# Patient Record
Sex: Male | Born: 1935 | Race: White | Hispanic: No | Smoking: Former smoker
Health system: Southern US, Community
[De-identification: ages and names within clinical notes are randomized; demographics above are authoritative.]

## PROBLEM LIST (undated history)

## (undated) DIAGNOSIS — K2950 Unspecified chronic gastritis without bleeding: Secondary | ICD-10-CM

## (undated) DIAGNOSIS — I219 Acute myocardial infarction, unspecified: Secondary | ICD-10-CM

## (undated) DIAGNOSIS — I2510 Atherosclerotic heart disease of native coronary artery without angina pectoris: Secondary | ICD-10-CM

## (undated) DIAGNOSIS — L739 Follicular disorder, unspecified: Secondary | ICD-10-CM

## (undated) DIAGNOSIS — D649 Anemia, unspecified: Secondary | ICD-10-CM

## (undated) DIAGNOSIS — B3781 Candidal esophagitis: Secondary | ICD-10-CM

## (undated) DIAGNOSIS — N3289 Other specified disorders of bladder: Secondary | ICD-10-CM

## (undated) DIAGNOSIS — M1990 Unspecified osteoarthritis, unspecified site: Secondary | ICD-10-CM

## (undated) DIAGNOSIS — E785 Hyperlipidemia, unspecified: Secondary | ICD-10-CM

## (undated) DIAGNOSIS — I10 Essential (primary) hypertension: Secondary | ICD-10-CM

## (undated) DIAGNOSIS — H269 Unspecified cataract: Secondary | ICD-10-CM

## (undated) DIAGNOSIS — E669 Obesity, unspecified: Secondary | ICD-10-CM

## (undated) DIAGNOSIS — K449 Diaphragmatic hernia without obstruction or gangrene: Secondary | ICD-10-CM

## (undated) DIAGNOSIS — I4891 Unspecified atrial fibrillation: Secondary | ICD-10-CM

## (undated) DIAGNOSIS — Z800 Family history of malignant neoplasm of digestive organs: Secondary | ICD-10-CM

## (undated) HISTORY — PX: CORONARY ARTERY BYPASS GRAFT: SHX141

## (undated) HISTORY — DX: Acute myocardial infarction, unspecified: I21.9

## (undated) HISTORY — DX: Candidal esophagitis: B37.81

## (undated) HISTORY — DX: Atherosclerotic heart disease of native coronary artery without angina pectoris: I25.10

## (undated) HISTORY — DX: Unspecified osteoarthritis, unspecified site: M19.90

## (undated) HISTORY — DX: Essential (primary) hypertension: I10

## (undated) HISTORY — DX: Hyperlipidemia, unspecified: E78.5

## (undated) HISTORY — DX: Anemia, unspecified: D64.9

## (undated) HISTORY — DX: Follicular disorder, unspecified: L73.9

## (undated) HISTORY — PX: CHOLECYSTECTOMY: SHX55

## (undated) HISTORY — DX: Family history of malignant neoplasm of digestive organs: Z80.0

## (undated) HISTORY — PX: PILONIDAL CYST EXCISION: SHX744

## (undated) HISTORY — DX: Unspecified cataract: H26.9

## (undated) HISTORY — DX: Unspecified chronic gastritis without bleeding: K29.50

## (undated) HISTORY — PX: KNEE SURGERY: SHX244

## (undated) HISTORY — DX: Unspecified atrial fibrillation: I48.91

## (undated) HISTORY — DX: Diaphragmatic hernia without obstruction or gangrene: K44.9

## (undated) HISTORY — DX: Other specified disorders of bladder: N32.89

## (undated) HISTORY — PX: MOLE REMOVAL: SHX2046

## (undated) HISTORY — DX: Obesity, unspecified: E66.9

---

## 1999-12-26 ENCOUNTER — Ambulatory Visit (HOSPITAL_COMMUNITY): Admission: RE | Admit: 1999-12-26 | Discharge: 1999-12-26 | Payer: Self-pay | Admitting: Internal Medicine

## 1999-12-26 ENCOUNTER — Encounter: Payer: Self-pay | Admitting: Internal Medicine

## 2002-02-09 ENCOUNTER — Encounter: Payer: Self-pay | Admitting: Internal Medicine

## 2002-02-09 ENCOUNTER — Ambulatory Visit (HOSPITAL_COMMUNITY): Admission: RE | Admit: 2002-02-09 | Discharge: 2002-02-09 | Payer: Self-pay | Admitting: Internal Medicine

## 2004-11-26 ENCOUNTER — Ambulatory Visit: Payer: Self-pay | Admitting: Internal Medicine

## 2004-12-21 ENCOUNTER — Ambulatory Visit: Payer: Self-pay | Admitting: Internal Medicine

## 2005-01-04 ENCOUNTER — Ambulatory Visit: Payer: Self-pay | Admitting: Internal Medicine

## 2005-02-01 ENCOUNTER — Ambulatory Visit: Payer: Self-pay | Admitting: Internal Medicine

## 2005-02-26 ENCOUNTER — Ambulatory Visit: Payer: Self-pay | Admitting: Internal Medicine

## 2005-03-04 ENCOUNTER — Ambulatory Visit: Payer: Self-pay | Admitting: Internal Medicine

## 2005-04-15 ENCOUNTER — Ambulatory Visit: Payer: Self-pay | Admitting: Internal Medicine

## 2005-04-19 ENCOUNTER — Ambulatory Visit: Payer: Self-pay | Admitting: Internal Medicine

## 2005-04-22 ENCOUNTER — Encounter: Admission: RE | Admit: 2005-04-22 | Discharge: 2005-04-22 | Payer: Self-pay | Admitting: Internal Medicine

## 2005-04-26 ENCOUNTER — Ambulatory Visit: Payer: Self-pay

## 2005-05-13 ENCOUNTER — Ambulatory Visit: Payer: Self-pay | Admitting: Cardiology

## 2005-06-06 ENCOUNTER — Ambulatory Visit: Payer: Self-pay | Admitting: Internal Medicine

## 2005-07-01 ENCOUNTER — Ambulatory Visit (HOSPITAL_COMMUNITY): Admission: RE | Admit: 2005-07-01 | Discharge: 2005-07-01 | Payer: Self-pay | Admitting: Internal Medicine

## 2005-07-01 ENCOUNTER — Ambulatory Visit: Payer: Self-pay | Admitting: Internal Medicine

## 2005-10-10 ENCOUNTER — Ambulatory Visit: Payer: Self-pay | Admitting: Cardiology

## 2006-03-28 ENCOUNTER — Ambulatory Visit: Payer: Self-pay | Admitting: Internal Medicine

## 2006-07-19 ENCOUNTER — Ambulatory Visit: Payer: Self-pay | Admitting: Family Medicine

## 2006-10-20 ENCOUNTER — Ambulatory Visit: Payer: Self-pay | Admitting: Cardiology

## 2006-11-18 ENCOUNTER — Ambulatory Visit: Payer: Self-pay | Admitting: Cardiology

## 2006-11-25 ENCOUNTER — Ambulatory Visit: Payer: Self-pay | Admitting: Cardiology

## 2007-01-07 ENCOUNTER — Ambulatory Visit: Payer: Self-pay | Admitting: Internal Medicine

## 2007-03-02 ENCOUNTER — Ambulatory Visit: Payer: Self-pay | Admitting: Internal Medicine

## 2007-03-02 LAB — CONVERTED CEMR LAB
ALT: 44 units/L — ABNORMAL HIGH (ref 0–40)
HDL: 32.3 mg/dL — ABNORMAL LOW (ref >39.0)
Total CHOL/HDL Ratio: 4.3
Triglycerides: 343 mg/dL (ref 0–149)
VLDL: 69 mg/dL — ABNORMAL HIGH (ref 0–40)

## 2007-03-07 ENCOUNTER — Emergency Department (HOSPITAL_COMMUNITY): Admission: EM | Admit: 2007-03-07 | Discharge: 2007-03-07 | Payer: Self-pay | Admitting: Emergency Medicine

## 2007-03-09 ENCOUNTER — Ambulatory Visit: Payer: Self-pay | Admitting: Internal Medicine

## 2007-03-10 ENCOUNTER — Ambulatory Visit: Payer: Self-pay | Admitting: Internal Medicine

## 2007-04-06 ENCOUNTER — Ambulatory Visit: Payer: Self-pay | Admitting: Internal Medicine

## 2007-04-13 ENCOUNTER — Encounter: Admission: RE | Admit: 2007-04-13 | Discharge: 2007-04-13 | Payer: Self-pay | Admitting: Internal Medicine

## 2007-06-25 ENCOUNTER — Encounter: Payer: Self-pay | Admitting: Internal Medicine

## 2007-06-25 DIAGNOSIS — E785 Hyperlipidemia, unspecified: Secondary | ICD-10-CM | POA: Insufficient documentation

## 2007-06-25 DIAGNOSIS — I10 Essential (primary) hypertension: Secondary | ICD-10-CM | POA: Insufficient documentation

## 2007-06-25 DIAGNOSIS — I25810 Atherosclerosis of coronary artery bypass graft(s) without angina pectoris: Secondary | ICD-10-CM | POA: Insufficient documentation

## 2007-06-25 DIAGNOSIS — I2510 Atherosclerotic heart disease of native coronary artery without angina pectoris: Secondary | ICD-10-CM

## 2007-12-25 ENCOUNTER — Telehealth: Payer: Self-pay | Admitting: Internal Medicine

## 2007-12-29 ENCOUNTER — Ambulatory Visit: Payer: Self-pay | Admitting: Internal Medicine

## 2007-12-29 LAB — CONVERTED CEMR LAB
Albumin: 4 g/dL (ref 3.5–5.2)
Alkaline Phosphatase: 44 units/L (ref 39–117)
BUN: 17 mg/dL (ref 6–23)
Creatinine, Ser: 1.1 mg/dL (ref 0.4–1.5)
HDL: 32.1 mg/dL — ABNORMAL LOW (ref >39.0)
Potassium: 4 meq/L (ref 3.5–5.1)
Total Bilirubin: 0.9 mg/dL (ref 0.3–1.2)
VLDL: 70 mg/dL — ABNORMAL HIGH (ref 0–40)

## 2007-12-30 ENCOUNTER — Encounter: Payer: Self-pay | Admitting: Internal Medicine

## 2008-01-08 ENCOUNTER — Ambulatory Visit: Payer: Self-pay | Admitting: Cardiology

## 2008-01-14 ENCOUNTER — Encounter: Payer: Self-pay | Admitting: Internal Medicine

## 2008-01-15 ENCOUNTER — Telehealth: Payer: Self-pay | Admitting: Internal Medicine

## 2008-02-01 ENCOUNTER — Ambulatory Visit: Payer: Self-pay | Admitting: Internal Medicine

## 2008-02-01 LAB — CONVERTED CEMR LAB
ALT: 84 U/L — ABNORMAL HIGH (ref 0–53)
AST: 71 U/L — ABNORMAL HIGH (ref 0–37)
Albumin: 3.9 g/dL (ref 3.5–5.2)
Alkaline Phosphatase: 51 U/L (ref 39–117)
Bilirubin, Direct: 0.1 mg/dL (ref 0.0–0.3)
Total Bilirubin: 0.7 mg/dL (ref 0.3–1.2)
Total Protein: 7.3 g/dL (ref 6.0–8.3)

## 2008-02-02 ENCOUNTER — Encounter: Payer: Self-pay | Admitting: Internal Medicine

## 2008-02-05 ENCOUNTER — Telehealth: Payer: Self-pay | Admitting: Internal Medicine

## 2008-02-09 ENCOUNTER — Telehealth: Payer: Self-pay | Admitting: Internal Medicine

## 2008-02-12 ENCOUNTER — Ambulatory Visit: Payer: Self-pay | Admitting: Internal Medicine

## 2008-02-12 ENCOUNTER — Telehealth: Payer: Self-pay | Admitting: Internal Medicine

## 2008-02-12 LAB — CONVERTED CEMR LAB
Ferritin: 164.8 ng/mL (ref 22.0–322.0)
Hepatitis B Surface Ag: NEGATIVE
Total Bilirubin: 0.8 mg/dL (ref 0.3–1.2)
Total Protein: 7.3 g/dL (ref 6.0–8.3)

## 2008-02-16 ENCOUNTER — Ambulatory Visit: Payer: Self-pay | Admitting: Internal Medicine

## 2008-03-17 ENCOUNTER — Encounter (INDEPENDENT_AMBULATORY_CARE_PROVIDER_SITE_OTHER): Payer: Self-pay | Admitting: *Deleted

## 2008-04-19 ENCOUNTER — Ambulatory Visit: Payer: Self-pay | Admitting: Internal Medicine

## 2008-04-19 LAB — CONVERTED CEMR LAB
ALT: 46 units/L (ref 0–53)
AST: 35 units/L (ref 0–37)
Alkaline Phosphatase: 39 units/L (ref 39–117)
Basophils Absolute: 0 10*3/uL (ref 0.0–0.1)
Basophils Relative: 0.1 % (ref 0.0–1.0)
Bilirubin, Direct: 0.1 mg/dL (ref 0.0–0.3)
CO2: 25 meq/L (ref 19–32)
Chloride: 109 meq/L (ref 96–112)
Creatinine, Ser: 1.2 mg/dL (ref 0.4–1.5)
Direct LDL: 147.8 mg/dL
Eosinophils Relative: 0 % (ref 0.0–5.0)
Leukocytes, UA: NEGATIVE
Lymphocytes Relative: 9.6 % — ABNORMAL LOW (ref 12.0–46.0)
MCHC: 34.2 g/dL (ref 30.0–36.0)
Mucus, UA: NEGATIVE
Neutrophils Relative %: 87.7 % — ABNORMAL HIGH (ref 43.0–77.0)
Potassium: 4.5 meq/L (ref 3.5–5.1)
RBC / HPF: NONE SEEN
RBC: 4.92 M/uL (ref 4.22–5.81)
Specific Gravity, Urine: 1.02 (ref 1.000–1.03)
Squamous Epithelial / LPF: NEGATIVE /lpf
Total Bilirubin: 0.9 mg/dL (ref 0.3–1.2)
Total CHOL/HDL Ratio: 7.7
Urobilinogen, UA: 0.2 (ref 0.0–1.0)
VLDL: 47 mg/dL — ABNORMAL HIGH (ref 0–40)
WBC: 12.6 10*3/uL — ABNORMAL HIGH (ref 4.5–10.5)

## 2008-04-25 ENCOUNTER — Telehealth: Payer: Self-pay | Admitting: Internal Medicine

## 2008-04-25 ENCOUNTER — Ambulatory Visit: Payer: Self-pay | Admitting: Internal Medicine

## 2008-04-25 DIAGNOSIS — E119 Type 2 diabetes mellitus without complications: Secondary | ICD-10-CM | POA: Insufficient documentation

## 2008-05-04 ENCOUNTER — Telehealth: Payer: Self-pay | Admitting: Internal Medicine

## 2008-06-02 ENCOUNTER — Telehealth (INDEPENDENT_AMBULATORY_CARE_PROVIDER_SITE_OTHER): Payer: Self-pay | Admitting: *Deleted

## 2008-06-03 ENCOUNTER — Ambulatory Visit: Payer: Self-pay | Admitting: Internal Medicine

## 2008-06-03 LAB — CONVERTED CEMR LAB
Albumin: 3.8 g/dL (ref 3.5–5.2)
Cholesterol: 155 mg/dL (ref 0–200)
Direct LDL: 74.2 mg/dL
HDL: 27.8 mg/dL — ABNORMAL LOW (ref >39.0)
Total Bilirubin: 0.8 mg/dL (ref 0.3–1.2)
Total CHOL/HDL Ratio: 5.6
Triglycerides: 335 mg/dL (ref 0–149)
VLDL: 67 mg/dL — ABNORMAL HIGH (ref 0–40)

## 2008-06-07 ENCOUNTER — Encounter: Payer: Self-pay | Admitting: Internal Medicine

## 2008-07-13 ENCOUNTER — Ambulatory Visit: Payer: Self-pay | Admitting: Cardiology

## 2008-10-18 ENCOUNTER — Encounter: Payer: Self-pay | Admitting: Internal Medicine

## 2008-11-11 ENCOUNTER — Ambulatory Visit: Payer: Self-pay | Admitting: Cardiology

## 2008-11-11 LAB — CONVERTED CEMR LAB
CO2: 28 meq/L (ref 19–32)
Chloride: 107 meq/L (ref 96–112)
GFR calc non Af Amer: 69 mL/min
Sodium: 142 meq/L (ref 135–145)

## 2008-11-18 ENCOUNTER — Ambulatory Visit: Payer: Self-pay

## 2008-11-21 ENCOUNTER — Telehealth: Payer: Self-pay | Admitting: Internal Medicine

## 2008-12-20 ENCOUNTER — Ambulatory Visit: Payer: Self-pay

## 2008-12-20 ENCOUNTER — Ambulatory Visit: Payer: Self-pay | Admitting: Cardiology

## 2008-12-20 ENCOUNTER — Encounter: Payer: Self-pay | Admitting: Cardiology

## 2009-02-07 ENCOUNTER — Ambulatory Visit: Payer: Self-pay | Admitting: Internal Medicine

## 2009-02-07 DIAGNOSIS — R319 Hematuria, unspecified: Secondary | ICD-10-CM | POA: Insufficient documentation

## 2009-02-07 DIAGNOSIS — K921 Melena: Secondary | ICD-10-CM | POA: Insufficient documentation

## 2009-02-07 LAB — CONVERTED CEMR LAB
Basophils Absolute: 0.1 10*3/uL (ref 0.0–0.1)
Basophils Relative: 1.9 % (ref 0.0–3.0)
Bilirubin Urine: NEGATIVE
CO2: 29 meq/L (ref 19–32)
Chloride: 104 meq/L (ref 96–112)
Eosinophils Absolute: 0.2 10*3/uL (ref 0.0–0.7)
GFR calc non Af Amer: 63 mL/min
Hemoglobin, Urine: NEGATIVE
Leukocytes, UA: NEGATIVE
Lymphocytes Relative: 41.2 % (ref 12.0–46.0)
MCHC: 34.7 g/dL (ref 30.0–36.0)
MCV: 95.1 fL (ref 78.0–100.0)
Neutrophils Relative %: 40.2 % — ABNORMAL LOW (ref 43.0–77.0)
Nitrite: NEGATIVE
Platelets: 186 10*3/uL (ref 150–400)
Potassium: 4.3 meq/L (ref 3.5–5.1)
Prothrombin Time: 11.6 s (ref 10.9–13.3)
RBC: 4.83 M/uL (ref 4.22–5.81)
Sodium: 141 meq/L (ref 135–145)
Squamous Epithelial / LPF: NEGATIVE /lpf
Total Protein, Urine: 30 mg/dL — AB
WBC: 6.2 10*3/uL (ref 4.5–10.5)
pH: 5.5 (ref 5.0–8.0)

## 2009-02-28 ENCOUNTER — Ambulatory Visit: Payer: Self-pay | Admitting: Internal Medicine

## 2009-02-28 DIAGNOSIS — R1013 Epigastric pain: Secondary | ICD-10-CM | POA: Insufficient documentation

## 2009-03-20 ENCOUNTER — Ambulatory Visit: Payer: Self-pay | Admitting: Occupational Medicine

## 2009-07-04 DIAGNOSIS — G479 Sleep disorder, unspecified: Secondary | ICD-10-CM | POA: Insufficient documentation

## 2009-07-04 DIAGNOSIS — E669 Obesity, unspecified: Secondary | ICD-10-CM | POA: Insufficient documentation

## 2009-07-04 DIAGNOSIS — M1990 Unspecified osteoarthritis, unspecified site: Secondary | ICD-10-CM | POA: Insufficient documentation

## 2009-07-05 ENCOUNTER — Telehealth: Payer: Self-pay | Admitting: Cardiology

## 2009-07-07 ENCOUNTER — Ambulatory Visit: Payer: Self-pay | Admitting: Cardiology

## 2009-07-07 DIAGNOSIS — R002 Palpitations: Secondary | ICD-10-CM

## 2009-07-14 ENCOUNTER — Ambulatory Visit: Payer: Self-pay | Admitting: Family Medicine

## 2009-07-15 ENCOUNTER — Encounter: Payer: Self-pay | Admitting: Family Medicine

## 2009-09-04 ENCOUNTER — Ambulatory Visit: Payer: Self-pay | Admitting: Internal Medicine

## 2009-09-04 DIAGNOSIS — I872 Venous insufficiency (chronic) (peripheral): Secondary | ICD-10-CM | POA: Insufficient documentation

## 2009-10-20 ENCOUNTER — Ambulatory Visit: Payer: Self-pay | Admitting: Internal Medicine

## 2009-10-20 LAB — CONVERTED CEMR LAB
Albumin: 4.3 g/dL (ref 3.5–5.2)
Alkaline Phosphatase: 47 units/L (ref 39–117)
Basophils Relative: 0.8 % (ref 0.0–3.0)
Chloride: 105 meq/L (ref 96–112)
Cholesterol: 171 mg/dL (ref 0–200)
Creatinine, Ser: 1.2 mg/dL (ref 0.4–1.5)
Direct LDL: 84.5 mg/dL
Eosinophils Relative: 2.9 % (ref 0.0–5.0)
HCT: 46.1 % (ref 39.0–52.0)
HDL: 31.6 mg/dL — ABNORMAL LOW (ref >39.00)
Hgb A1c MFr Bld: 7.1 % — ABNORMAL HIGH (ref 4.6–6.5)
Lymphs Abs: 2.3 10*3/uL (ref 0.7–4.0)
Monocytes Relative: 11.3 % (ref 3.0–12.0)
Neutrophils Relative %: 45.8 % (ref 43.0–77.0)
Platelets: 181 10*3/uL (ref 150.0–400.0)
Potassium: 4.6 meq/L (ref 3.5–5.1)
RBC: 4.72 M/uL (ref 4.22–5.81)
Sodium: 142 meq/L (ref 135–145)
TSH: 2.81 microintl units/mL (ref 0.35–5.50)
Total CHOL/HDL Ratio: 5
Total Protein: 7.9 g/dL (ref 6.0–8.3)
Triglycerides: 344 mg/dL — ABNORMAL HIGH (ref 0.0–149.0)
VLDL: 68.8 mg/dL — ABNORMAL HIGH (ref 0.0–40.0)
WBC: 5.9 10*3/uL (ref 4.5–10.5)

## 2009-11-06 ENCOUNTER — Encounter: Payer: Self-pay | Admitting: Internal Medicine

## 2010-01-15 ENCOUNTER — Ambulatory Visit: Payer: Self-pay | Admitting: Occupational Medicine

## 2010-02-24 ENCOUNTER — Telehealth: Payer: Self-pay | Admitting: Physician Assistant

## 2010-06-18 ENCOUNTER — Telehealth: Payer: Self-pay | Admitting: Internal Medicine

## 2010-06-19 ENCOUNTER — Ambulatory Visit: Payer: Self-pay | Admitting: Cardiology

## 2010-06-19 DIAGNOSIS — I498 Other specified cardiac arrhythmias: Secondary | ICD-10-CM | POA: Insufficient documentation

## 2010-06-27 ENCOUNTER — Telehealth: Payer: Self-pay | Admitting: Cardiology

## 2010-07-31 ENCOUNTER — Ambulatory Visit: Payer: Self-pay | Admitting: Cardiology

## 2010-08-14 ENCOUNTER — Telehealth: Payer: Self-pay | Admitting: Cardiology

## 2010-08-14 LAB — CONVERTED CEMR LAB
ALT: 26 units/L (ref 0–53)
AST: 28 units/L (ref 0–37)
Alkaline Phosphatase: 42 units/L (ref 39–117)
Bilirubin, Direct: 0.1 mg/dL (ref 0.0–0.3)
HDL: 33.8 mg/dL — ABNORMAL LOW (ref >39.00)
Total Bilirubin: 0.8 mg/dL (ref 0.3–1.2)
Total Protein: 6.9 g/dL (ref 6.0–8.3)

## 2010-09-25 ENCOUNTER — Ambulatory Visit: Payer: Self-pay | Admitting: Cardiology

## 2010-09-28 ENCOUNTER — Telehealth: Payer: Self-pay | Admitting: Cardiology

## 2010-09-28 LAB — CONVERTED CEMR LAB
AST: 25 units/L (ref 0–37)
Alkaline Phosphatase: 50 units/L (ref 39–117)
Bilirubin, Direct: 0.1 mg/dL (ref 0.0–0.3)
Total Bilirubin: 0.8 mg/dL (ref 0.3–1.2)
Total CHOL/HDL Ratio: 5
VLDL: 75 mg/dL — ABNORMAL HIGH (ref 0.0–40.0)

## 2010-10-02 ENCOUNTER — Ambulatory Visit: Payer: Self-pay | Admitting: Cardiology

## 2010-10-02 LAB — CONVERTED CEMR LAB: Hgb A1c MFr Bld: 5.8 % (ref 4.6–6.5)

## 2010-10-04 ENCOUNTER — Telehealth: Payer: Self-pay | Admitting: Cardiology

## 2010-12-31 ENCOUNTER — Ambulatory Visit
Admission: RE | Admit: 2010-12-31 | Discharge: 2010-12-31 | Payer: Self-pay | Source: Home / Self Care | Attending: Cardiology | Admitting: Cardiology

## 2010-12-31 ENCOUNTER — Other Ambulatory Visit: Payer: Self-pay | Admitting: Cardiology

## 2010-12-31 ENCOUNTER — Telehealth: Payer: Self-pay | Admitting: Cardiology

## 2010-12-31 LAB — LIPID PANEL
Cholesterol: 168 mg/dL (ref 0–200)
Total CHOL/HDL Ratio: 5

## 2011-01-06 LAB — CONVERTED CEMR LAB
BUN: 18 mg/dL (ref 6–23)
Calcium: 9.5 mg/dL (ref 8.4–10.5)
Creatinine, Ser: 1.2 mg/dL (ref 0.4–1.5)
GFR calc non Af Amer: 61.28 mL/min (ref >60)
Glucose, Bld: 92 mg/dL (ref 70–99)

## 2011-01-08 NOTE — Assessment & Plan Note (Signed)
Summary: SORE THROAT,DIZZY/TJ   Vital Signs:  Patient Profile:   77 Years Old Male CC:      Sore throat, lightheaded, cough, watery eyes, sneezing x 4 days Height:     70 inches Weight:      237 pounds O2 Sat:      96 % O2 treatment:    Room Air Temp:     98.6 degrees F oral Pulse rate:   65 / minute Pulse rhythm:   regular Resp:     20 per minute BP sitting:   94 / 59  (right arm) Cuff size:   large  Vitals Entered By: Avel Sensor, CMA                  Current Allergies (reviewed today): No known allergies History of Present Illness Chief Complaint: Sore throat, lightheaded, cough, watery eyes, sneezing x 4 days History of Present Illness: Presents with complaints of URI symptoms for 4 days.  No reports of fever or productive cough.   Some body aches.    BP a little low.   Takes BP meds.   No complaints of syncope, but feels tired.    REVIEW OF SYSTEMS Constitutional Symptoms      Denies fever, chills, night sweats, weight loss, weight gain, and fatigue.  Eyes       Denies change in vision, eye pain, eye discharge, glasses, contact lenses, and eye surgery. Ear/Nose/Throat/Mouth       Complains of frequent runny nose, sinus problems, sore throat, and hoarseness.      Denies hearing loss/aids, change in hearing, ear pain, ear discharge, dizziness, frequent nose bleeds, and tooth pain or bleeding.  Respiratory       Complains of dry cough.      Denies productive cough, wheezing, shortness of breath, asthma, bronchitis, and emphysema/COPD.  Cardiovascular       Complains of tires easily with exhertion.      Denies murmurs and chest pain.    Gastrointestinal       Denies stomach pain, nausea/vomiting, diarrhea, constipation, blood in bowel movements, and indigestion. Genitourniary       Denies painful urination, kidney stones, and loss of urinary control. Neurological       Complains of headaches.      Denies paralysis, seizures, and fainting/blackouts. Musculoskeletal       Complains of muscle pain and joint pain.      Denies joint stiffness, decreased range of motion, redness, swelling, muscle weakness, and gout.  Skin       Denies bruising, unusual mles/lumps or sores, and hair/skin or nail changes.  Psych       Denies mood changes, temper/anger issues, anxiety/stress, speech problems, depression, and sleep problems.  Past History:  Past Medical History: Reviewed history from 07/04/2009 and no changes required. knee pain with effusion '09 GI here: had a colon in ? 2007 Current Problems:  CORONARY ARTERY DISEASE (ICD-414.00) HYPERLIPIDEMIA (ICD-272.4) HYPERTENSION (ICD-401.9) FOLLICULITIS (ICD-704.8) DYSPEPSIA (ICD-536.8) HEMATURIA UNSPECIFIED (ICD-599.70) HEMATOCHEZIA (ICD-578.1) AODM (ICD-250.00) OTHER ABNORMAL BLOOD CHEMISTRY (ICD-790.6) TOBACCO ABUSE, HX OF (ICD-V15.82) OBESITY (ICD-278.00) OSTEOARTHRITIS (ICD-715.90) IRRITABLE BLADDER (ICD-596.8) SLEEP DISORDER (ICD-780.50)  Past Surgical History: Reviewed history from 07/04/2009 and no changes required. Coronary artery bypass graft '92: LIMA-LAD, SVG - D2,R1, D1 Cholecystectomy-laproscopic '92 mole removal '01, '08  Family History: Reviewed history from 07/04/2009 and no changes required. father - deceased @84 : CAD/MI, CVA mother - deceased @ 19: DM, Breast cancer Brother - CAD/MI-fatal Maternal Uncle -  colon cancer.  Social History: Reviewed history from 07/04/2009 and no changes required. [employer] - 3 years Married '57- widowed 05-09-23 1 son -'53- died '05-16-23 MVA Retired: keeps himself busy.  He quit smoking.  Physical Exam General appearance: well developed, well nourished, no acute distress Pupils: equal, round, reactive to light Ears: normal, no lesions or deformities Nasal: marked sinus and nasal congestion Oral/Pharynx: tongue normal, posterior pharynx without erythema or exudate Chest/Lungs: no rales, wheezes, or rhonchi bilateral, breath sounds equal without  effort Heart: regular rate and  rhythm, no murmur Assessment New Problems: UPPER RESPIRATORY INFECTION, ACUTE (ICD-465.9)   Plan New Medications/Changes: CHERATUSSIN DAC 30-10-100 MG/5ML SOLN (PSEUDOEPHEDRINE-CODEINE-GG) 5 ml every 8 hours as needed for cough and sinus congestion  #4 oz x 0, 01/15/2010, Kathrine Haddock MD  New Orders: Est. Patient Level III [phone] Planning Comments:    Cough medicine as prescribed Antibiotice not recommended at this time Follow up as needed.     The patient and/or caregiver has been counseled thoroughly with regard to medications prescribed including dosage, schedule, interactions, rationale for use, and possible side effects and they verbalize understanding.  Diagnoses and expected course of recovery discussed and will return if not improved as expected or if the condition worsens. Patient and/or caregiver verbalized understanding.  Prescriptions: CHERATUSSIN DAC 30-10-100 MG/5ML SOLN (PSEUDOEPHEDRINE-CODEINE-GG) 5 ml every 8 hours as needed for cough and sinus congestion  #4 oz x 0   Entered and Authorized by:   Kathrine Haddock MD   Signed by:   Kathrine Haddock MD on 01/15/2010   Method used:   Print then Give to Patient   RxID:   [id number]

## 2011-01-08 NOTE — Progress Notes (Signed)
Summary: Medications  Phone Note Outgoing Call   Call placed by: Julieta Gutting, RN, BSN,  August 14, 2010 2:57 PM Call placed to: Patient Summary of Call: [name]  See Herbert Seta telephone note.  Ask him if he is better.  He can take 12.5mg  met succ daily, which would be ok.  I dont recall seeing this telephone note. Thx TS  I spoke with the pt about the results of his labwork.  The pt will increase his Pravastatin to 80mg  daily and recheck labs 09/25/10.  The pt has been taking his supply of Metoprolol Succ at home as needed.  I told the pt to start taking this on a daily basis to decrease his episodes of heart racing. Rx sent to the pharmacy.  Initial call taken by: Julieta Gutting, RN, BSN,  August 14, 2010 3:10 PM    New/Updated Medications: PRAVASTATIN SODIUM 80 MG TABS (PRAVASTATIN SODIUM) Take one tablet by mouth daily at bedtime METOPROLOL SUCCINATE 25 MG XR24H-TAB (METOPROLOL SUCCINATE) Take one-half tablet by mouth daily Prescriptions: PRAVASTATIN SODIUM 80 MG TABS (PRAVASTATIN SODIUM) Take one tablet by mouth daily at bedtime  #30 x 6   Entered by:   Julieta Gutting, RN, BSN   Authorized by:   Ronaldo Miyamoto, MD, [facility]   Signed by:   Julieta Gutting, RN, BSN on 08/14/2010   Method used:   Electronically to        [facility] (retail)       [address]       [place], [place]  [address]       Ph: [phone]       Fax: [phone]   RxID:   [id number] METOPROLOL SUCCINATE 25 MG XR24H-TAB (METOPROLOL SUCCINATE) Take one-half tablet by mouth daily  #30 x 6   Entered by:   Julieta Gutting, RN, BSN   Authorized by:   Ronaldo Miyamoto, MD, [facility]   Signed by:   Julieta Gutting, RN, BSN on 08/14/2010   Method used:   Electronically to        [facility] (retail)       [address]       [place], [place]  [address]       Ph: [phone]       Fax: [phone]   RxID:   [id number]

## 2011-01-08 NOTE — Progress Notes (Signed)
Summary: rtn call from yesterday  Phone Note Call from Patient Call back at [phone]   Caller: Patient Reason for Call: Talk to Nurse, Talk to Doctor, Lab or Test Results Summary of Call: pt rtn call from lauren to get lab results Initial call taken by: Omer Jack,  October 04, 2010 8:17 AM  Follow-up for Phone Call        Pt given results of hgb A1C and glucose. He will increase fish oil to two times a day per Dr. Rosalyn Charters recommendations.  He will come in for fasting recheck of these labs on December 31, 2009. Follow-up by: Dossie Arbour, RN, BSN,  October 04, 2010 8:54 AM    New/Updated Medications: FISH OIL 1000 MG  CAPS (OMEGA-3 FATTY ACIDS) take one tab by mouth twice daily  Appended Document: rtn call from yesterday Labs ordered for December 31, 2010.

## 2011-01-08 NOTE — Assessment & Plan Note (Signed)
Summary: f1y   Visit Type:  1 year follow up Primary Provider:  Norins  CC:  No complains.  History of Present Illness: Drives cars for dealership, plays golf several days per week, then goes to the gym.  Discussed amlodipine, simva interaction.  Denies any chest pain.   CABG March 1993.    Current Medications (verified): 1)  Benazepril Hcl 40 Mg  Tabs (Benazepril Hcl) .... Take One Tab By Mouth Once Daily 2)  Furosemide 40 Mg  Tabs (Furosemide) .Marland Kitchen.. 1 Once Daily 3)  Amlodipine Besylate 5 Mg  Tabs (Amlodipine Besylate) .Marland Kitchen.. 1 Once Daily 4)  Metoprolol Succinate 25 Mg  Tb24 (Metoprolol Succinate) .... Take 1 Tablet By Mouth Once A Day 5)  Simvastatin 40 Mg  Tabs (Simvastatin) .Marland Kitchen.. 1 By Mouth Qpm 6)  Calcium 600/vitamin D 600-200 Mg-Unit  Tabs (Calcium Carbonate-Vitamin D) .... Take 1 Tablet By Mouth Two Times A Day 7)  Fish Oil 1000 Mg  Caps (Omega-3 Fatty Acids) .... Take One Tab By Mouth Once Daily 8)  Adult Aspirin Ec Low Strength 81 Mg  Tbec (Aspirin) .... Take One Tab By Mouth Once Daily 9)  Multivitamins   Tabs (Multiple Vitamin) .... Take 1 Tablet By Mouth Once A Day 10)  Vitamin B-12 1000 Mcg Tabs (Cyanocobalamin) .... Take 1 Tablet By Mouth Once A Day 11)  Vitamin D 1000 Unit Tabs (Cholecalciferol) .... Take 1 Tablet By Mouth Once A Day  Allergies (verified): No Known Drug Allergies  Past History:  Past Medical History: Last updated: 07/04/2009 knee pain with effusion '09 GI here: had a colon in ? 2007 Current Problems:  CORONARY ARTERY DISEASE (ICD-414.00) HYPERLIPIDEMIA (ICD-272.4) HYPERTENSION (ICD-401.9) FOLLICULITIS (ICD-704.8) DYSPEPSIA (ICD-536.8) HEMATURIA UNSPECIFIED (ICD-599.70) HEMATOCHEZIA (ICD-578.1) AODM (ICD-250.00) OTHER ABNORMAL BLOOD CHEMISTRY (ICD-790.6) TOBACCO ABUSE, HX OF (ICD-V15.82) OBESITY (ICD-278.00) OSTEOARTHRITIS (ICD-715.90) IRRITABLE BLADDER (ICD-596.8) SLEEP DISORDER (ICD-780.50)  Vital Signs:  Patient profile:   77 year old  male Height:      70 inches Weight:      224 pounds BMI:     32.26 Pulse rate:   45 / minute Pulse rhythm:   regular Resp:     18 per minute BP sitting:   110 / 72  (left arm) Cuff size:   large  Vitals Entered By: Vikki Ports (June 19, 2010 11:48 AM)  Physical Exam  General:  Well developed, well nourished, in no acute distress. Head:  normocephalic and atraumatic Eyes:  PERRLA/EOM intact; conjunctiva and lids normal. Lungs:  Clear bilaterally to auscultation and percussion. Heart:  PMI non displaced.  Normal S1 and S2.  No murmur.  Abdomen:  Bowel sounds positive; abdomen soft and non-tender without masses, organomegaly, or hernias noted. No hepatosplenomegaly. Pulses:  pulses normal in all 4 extremities Extremities:  No clubbing or cyanosis. Neurologic:  Alert and oriented x 3.   EKG  Procedure date:  06/19/2010  Findings:      Marked sinus bradycardia with first degree av block.   Impression & Recommendations:  Problem # 1:  BRADYCARDIA (ICD-427.89) See ECG report.  Sometimes his BP gets below 100 and HR rarely over 70.  Current rate 45.  Will stop metoprolol as only in very low dose at this point in time.  Explained to patient in detail  His updated medication list for this problem includes:    Benazepril Hcl 40 Mg Tabs (Benazepril hcl) .Marland Kitchen... Take one tab by mouth once daily    Amlodipine Besylate 5 Mg Tabs (Amlodipine  besylate) .Marland Kitchen... 1 once daily    Adult Aspirin Ec Low Strength 81 Mg Tbec (Aspirin) .Marland Kitchen... Take one tab by mouth once daily  Problem # 2:  CAD, ARTERY BYPASS GRAFT (ICD-414.04) continues to remain stable at this point in time.  The following medications were removed from the medication list:    Metoprolol Succinate 25 Mg Tb24 (Metoprolol succinate) .Marland Kitchen... Take 1 tablet by mouth once a day His updated medication list for this problem includes:    Benazepril Hcl 40 Mg Tabs (Benazepril hcl) .Marland Kitchen... Take one tab by mouth once daily    Amlodipine Besylate 5  Mg Tabs (Amlodipine besylate) .Marland Kitchen... 1 once daily    Adult Aspirin Ec Low Strength 81 Mg Tbec (Aspirin) .Marland Kitchen... Take one tab by mouth once daily    Orders: EKG w/ Interpretation (93000) TLB-BMP (Basic Metabolic Panel-BMET) (80048-METABOL)  Problem # 3:  HYPERLIPIDEMIA (ICD-272.4)  Will dc simva with new warning with amlodipine.  Recheck lipids and lvier in aboutr six weeks.  His updated medication list for this problem includes:    Pravastatin Sodium 40 Mg Tabs (Pravastatin sodium) .Marland Kitchen... Take one tablet by mouth once a day  Orders: EKG w/ Interpretation (93000) TLB-BMP (Basic Metabolic Panel-BMET) (80048-METABOL)  His updated medication list for this problem includes:    Pravastatin Sodium 40 Mg Tabs (Pravastatin sodium) .Marland Kitchen... Take one tablet by mouth once a day  Problem # 4:  VENOUS INSUFFICIENCY, LEGS (ICD-459.81) on furosemide, so will check BMET.   Patient Instructions: 1)  Your physician recommends that you return for a FASTING LIPID and LIVER Profile in 6 WEEKS (414.01, 272.0, 427.89)  2)  Your physician recommends that you have lab work today: BMP 3)  Your physician has recommended you make the following change in your medication: STOP Metoprolol and Simvastatin, START Pravastatin 40mg  once a day 4)  Your physician wants you to follow-up in:  1 YEAR. You will receive a reminder letter in the mail two months in advance. If you don't receive a letter, please call our office to schedule the follow-up appointment. Prescriptions: PRAVASTATIN SODIUM 40 MG TABS (PRAVASTATIN SODIUM) take one tablet by mouth once a day  #30 x 11   Entered by:   Julieta Gutting, RN, BSN   Authorized by:   Ronaldo Miyamoto, MD, [facility]   Signed by:   Julieta Gutting, RN, BSN on 06/19/2010   Method used:   Electronically to        [facility] (retail)       [address]       [place], [place]  [address]       Ph: [phone]       Fax: [phone]   RxID:   [id number]

## 2011-01-08 NOTE — Letter (Signed)
Summary: [facility]  [facility]   Imported By: Sherian Rein 12/13/2009 13:20:27  _____________________________________________________________________  External Attachment:    Type:   Image     Comment:   External Document

## 2011-01-08 NOTE — Progress Notes (Signed)
  Phone Note Refill Request Message from:  Fax from Pharmacy on June 18, 2010 11:36 AM  Refills Requested: Medication #1:  BENAZEPRIL HCL 40 MG  TABS take one tab by mouth once daily Initial call taken by: Ami Bullins CMA,  June 18, 2010 11:36 AM    Prescriptions: BENAZEPRIL HCL 40 MG  TABS (BENAZEPRIL HCL) take one tab by mouth once daily  #30 Each x 12   Entered by:   Ami Bullins CMA   Authorized by:   Jacques Navy MD   Signed by:   Bill Salinas CMA on 06/18/2010   Method used:   Electronically to        [facility] (retail)       [address]       [place], [place]  [address]       Ph: [phone]       Fax: [phone]   RxID:   [id number]

## 2011-01-08 NOTE — Progress Notes (Signed)
  Phone Note Call from Patient Call back at Home Phone [phone]   Caller: Patient Call For: Shawnie Pons, MD Reason for Call: Talk to Doctor Action Taken: Phone Call Completed Details of Complaint: Blood Pressure Summary of Call: Patient played golf yesterday. He felt lightheaded and noted that his BP was 79 systolically. He went home and it came up to 98, then 101 and then this AM was 130. He took all his meds today. He feels normal now. He denies lightheadedness, syncope, chest pain, fevers, nausea, vomiting or diarrhea. BP now is 110/58 with HR 54.  Advised him to drink plenty of fluids. He can hold his Lasix tomorrow if his pressure is still running low. He should call back if he is symptomatic again. He should schedule a follow up appointment if his blood pressure continues to run lower than normal for him. Initial call taken by: Brynda Rim,  February 24, 2010 3:28 PM  Follow-up for Phone Call        02/26/10  8:35 am--spoke with pt. who states feeling better today--B/P running 135/50 x2 this am--no liteheadedness, C.P. , or nausea,--has held furosemide x 1 day--will resiume 02/27/10--advised to call us if any further symptoms--nt Follow-up by: Ledon Snare, RN,  February 26, 2010 8:44 AM     Appended Document:  Reviewed and agree with plan.

## 2011-01-08 NOTE — Progress Notes (Signed)
  Phone Note Refill Request Message from:  Fax from Pharmacy on June 18, 2010 11:36 AM  Refills Requested: Medication #1:  SIMVASTATIN 40 MG  TABS 1 by mouth qPM Initial call taken by: Ami Bullins CMA,  June 18, 2010 11:36 AM    Prescriptions: SIMVASTATIN 40 MG  TABS (SIMVASTATIN) 1 by mouth qPM  #30 Each x 6   Entered by:   Ami Bullins CMA   Authorized by:   Jacques Navy MD   Signed by:   Bill Salinas CMA on 06/18/2010   Method used:   Electronically to        [facility] (retail)       [address]       [place], [place]  [address]       Ph: [phone]       Fax: [phone]   RxID:   [id number]

## 2011-01-08 NOTE — Progress Notes (Signed)
Summary: c/o heart racing  Phone Note Call from Patient Call back at Home Phone [phone] Call back at [phone]   Caller: Patient Reason for Call: Talk to Nurse Summary of Call: per pt calling  meds was changed by ts on 7/12 . c/o  heart racing  Initial call taken by: Lorne Skeens,  June 27, 2010 8:09 AM  Follow-up for Phone Call        The pt's metoprolol was d/c'ed on 7/12 due to a pulse of 45 in the office. The pt states his HR usually runs about 60-65 bpm. He states that lately, his HR has been running 89 and getting up in the 100's with exercise. I explained to the pt that the pulse of 89 is still within a normal range for a heart rate. The pt states he feels "jittery" when his HR gets that high. He was taking metoprolol 25 mg two times a day prior to this being d/c'ed. He wanted to know if he could take a 25mg  tab in the mornings. I explained I would have him check his pulse in the moring. If his HR is 60 or above, I have suggested he may take metoprolol 12.5mg  and then in the pm he has been instructed to check his pulse again, if his HR is 60 or above, he will take another metoprolol 12.5 mg. He has been instructed not to take any metoprolol if his HR is less than 60bpm. The pt verbalizes understanding. He will call with any more concerns. Follow-up by: Sherri Rad, RN, BSN,  June 27, 2010 8:23 AM

## 2011-01-08 NOTE — Progress Notes (Signed)
Summary: rtn call for lab results  Phone Note Call from Patient Call back at [phone]   Caller: Patient Reason for Call: Talk to Nurse, Talk to Doctor, Lab or Test Results Summary of Call: pt call to get lab results Initial call taken by: Omer Jack,  September 28, 2010 8:55 AM  Follow-up for Phone Call        pt given lab results, he will come for fasting  glucose and hgb A1C on Tue 10/25 Meredith Staggers, RN  September 28, 2010 8:59 AM

## 2011-01-10 NOTE — Progress Notes (Signed)
Summary: Increased pulse  Phone Note Outgoing Call   Call placed by: Julieta Gutting, RN, BSN,  December 31, 2010 6:10 PM Call placed to: Patient Summary of Call: Pt came into the office today for labwork.  The pt spoke with Tresa Endo RN and told her that since his fish oil was increased his heart rate has on occasion ran in the 90s.  The pt said his pulse use to be in the 60s.  Await lab results.   Reviewed with Dr Riley Kill and the pt can increase Metoprolol Succ to 25mg  daily.  Pt should monitor pulse and go back to half daily if pulse less than 60. Julieta Gutting, RN, BSN  December 31, 2010 7:12 PM  Follow-up for Phone Call        Left message for pt to call back. Julieta Gutting, RN, BSN  January 02, 2011 4:00 PM  per pt calling back [phone] [name]  January 03, 2011 9:55 AM   I spoke with the pt and he said he has not been taking Metoprolol Succinate.  I instructed the pt to restart this medication at 25mg  one-half tablet daily and monitor his pulse.  Pt agreed with plan. New Rx sent to pharmacy.  Follow-up by: Julieta Gutting, RN, BSN,  January 03, 2011 10:22 AM    New/Updated Medications: METOPROLOL SUCCINATE 25 MG XR24H-TAB (METOPROLOL SUCCINATE) Take one-half tablet by mouth daily Prescriptions: METOPROLOL SUCCINATE 25 MG XR24H-TAB (METOPROLOL SUCCINATE) Take one-half tablet by mouth daily  #30 x 6   Entered by:   Julieta Gutting, RN, BSN   Authorized by:   Ronaldo Miyamoto, MD, [facility]   Signed by:   Julieta Gutting, RN, BSN on 01/03/2011   Method used:   Electronically to        [facility] (retail)       [address]       [place], [place]  [address]       Ph: [phone]       Fax: [phone]   RxID:   [id number]

## 2011-02-25 ENCOUNTER — Encounter: Payer: Self-pay | Admitting: Cardiology

## 2011-02-25 ENCOUNTER — Telehealth: Payer: Self-pay | Admitting: Cardiovascular Disease

## 2011-02-25 ENCOUNTER — Other Ambulatory Visit (INDEPENDENT_AMBULATORY_CARE_PROVIDER_SITE_OTHER): Payer: Medicare Other

## 2011-02-25 ENCOUNTER — Other Ambulatory Visit: Payer: Self-pay | Admitting: Cardiology

## 2011-02-25 DIAGNOSIS — I2510 Atherosclerotic heart disease of native coronary artery without angina pectoris: Secondary | ICD-10-CM

## 2011-02-25 DIAGNOSIS — E785 Hyperlipidemia, unspecified: Secondary | ICD-10-CM

## 2011-02-25 LAB — LDL CHOLESTEROL, DIRECT: Direct LDL: 65.9 mg/dL

## 2011-02-25 LAB — HEPATIC FUNCTION PANEL
Albumin: 4.3 g/dL (ref 3.5–5.2)
Alkaline Phosphatase: 40 U/L (ref 39–117)
Total Protein: 7.2 g/dL (ref 6.0–8.3)

## 2011-02-25 LAB — LIPID PANEL
Cholesterol: 133 mg/dL (ref 0–200)
Total CHOL/HDL Ratio: 4
Triglycerides: 223 mg/dL — ABNORMAL HIGH (ref 0.0–149.0)
VLDL: 44.6 mg/dL — ABNORMAL HIGH (ref 0.0–40.0)

## 2011-03-01 ENCOUNTER — Other Ambulatory Visit: Payer: Self-pay | Admitting: *Deleted

## 2011-03-01 NOTE — Telephone Encounter (Signed)
[facility] left vm req rf of Norvasc 5mg  1 qd. Please help w/this, thanks!!

## 2011-03-02 MED ORDER — AMLODIPINE BESYLATE 5 MG PO TABS: 5.0000 mg | ORAL_TABLET | Freq: Every day | ORAL | Status: DC

## 2011-03-02 NOTE — Telephone Encounter (Signed)
Ok for refills. Thanks!

## 2011-03-04 ENCOUNTER — Other Ambulatory Visit: Payer: Self-pay | Admitting: *Deleted

## 2011-03-04 MED ORDER — AMLODIPINE BESYLATE 5 MG PO TABS: 5.0000 mg | ORAL_TABLET | Freq: Every day | ORAL | Status: DC

## 2011-03-04 NOTE — Telephone Encounter (Signed)
Refill request [facility]

## 2011-03-06 ENCOUNTER — Telehealth: Payer: Self-pay | Admitting: Cardiology

## 2011-03-07 NOTE — Telephone Encounter (Signed)
I spoke with the pt and made him aware of 02/25/11 lipid and liver results.  These labs were drawn to follow-up on the pt starting Lipitor 20mg  daily (LDL is now less than 70).  The pt will continue current dose of Lipitor. The pt will continue to modify diet for improvement in triglyceride level.

## 2011-03-07 NOTE — Progress Notes (Signed)
Summary: BP readings  Phone Note Call from Patient   Summary of Call: Pt came into the office for labwork and dropped off BP readings for his record.  114/58, pulse 89 last night then 121/74, pulse 75 this morning.  Initial call taken by: Julieta Gutting, RN, BSN,  February 25, 2011 9:39 AM

## 2011-03-28 ENCOUNTER — Encounter: Payer: Self-pay | Admitting: Physician Assistant

## 2011-03-28 ENCOUNTER — Telehealth: Payer: Self-pay | Admitting: Physician Assistant

## 2011-03-29 ENCOUNTER — Encounter: Payer: Self-pay | Admitting: Physician Assistant

## 2011-03-29 ENCOUNTER — Ambulatory Visit (INDEPENDENT_AMBULATORY_CARE_PROVIDER_SITE_OTHER): Payer: Medicare Other | Admitting: Physician Assistant

## 2011-03-29 VITALS — BP 90/60 | HR 83 | Ht 70.0 in | Wt 215.0 lb

## 2011-03-29 DIAGNOSIS — I2510 Atherosclerotic heart disease of native coronary artery without angina pectoris: Secondary | ICD-10-CM

## 2011-03-29 DIAGNOSIS — I25810 Atherosclerosis of coronary artery bypass graft(s) without angina pectoris: Secondary | ICD-10-CM

## 2011-03-29 DIAGNOSIS — I10 Essential (primary) hypertension: Secondary | ICD-10-CM

## 2011-03-29 DIAGNOSIS — I4891 Unspecified atrial fibrillation: Secondary | ICD-10-CM | POA: Insufficient documentation

## 2011-03-29 LAB — TSH: TSH: 2.81 u[IU]/mL (ref 0.35–5.50)

## 2011-03-29 LAB — CBC WITH DIFFERENTIAL/PLATELET
Basophils Absolute: 0 10*3/uL (ref 0.0–0.1)
Eosinophils Absolute: 0.3 10*3/uL (ref 0.0–0.7)
Lymphocytes Relative: 27.1 % (ref 12.0–46.0)
MCHC: 34.9 g/dL (ref 30.0–36.0)
Monocytes Relative: 11.5 % (ref 3.0–12.0)
Platelets: 182 10*3/uL (ref 150.0–400.0)
RDW: 12.7 % (ref 11.5–14.6)

## 2011-03-29 LAB — BASIC METABOLIC PANEL
BUN: 18 mg/dL (ref 6–23)
Calcium: 9.2 mg/dL (ref 8.4–10.5)
Creatinine, Ser: 1.1 mg/dL (ref 0.4–1.5)
GFR: 68.21 mL/min (ref >60.00)
Glucose, Bld: 82 mg/dL (ref 70–99)
Potassium: 3.7 mEq/L (ref 3.5–5.1)

## 2011-03-29 MED ORDER — DABIGATRAN ETEXILATE MESYLATE 150 MG PO CAPS: 150.0000 mg | ORAL_CAPSULE | Freq: Two times a day (BID) | ORAL | Status: DC

## 2011-03-29 NOTE — Patient Instructions (Addendum)
Your physician recommends that you schedule a follow-up appointment in: 2 WEEKS WITH DR. Riley Kill, APPT IS TO BE SCHEDULED WITH DR. Riley Kill AS PER SCOTT WEAVER, PA-C AND PT WANTS TO TALK TO DR. Riley Kill ABOUT BEING ON PRADAXA....AS PER Julieta Gutting, RN NOTHING AVAILABLE FOR [facility] TO PLEASE SCHEDULE THIS W/SCOTT WEAVER, PA-C.  Your physician has requested that you have an echocardiogram 427.31, 414.00. Echocardiography is a painless test that uses sound waves to create images of your heart. It provides your doctor with information about the size and shape of your heart and how well your heart's chambers and valves are working. This procedure takes approximately one hour. There are no restrictions for this procedure.  Your physician has recommended you make the following change in your medication: START PRADAXA 150 MG 1 CAP TWICE DAILY, SPACE THESE OUT TO EVERY 12 HOURS.  START 81 MG ASPIRIN ONCE DAILY  Your physician recommends that you return for lab work in: TODAY BMET, TSH, CBC W/DIFF 427.31.

## 2011-03-29 NOTE — Assessment & Plan Note (Addendum)
This is of new onset.  His CHADS2/CHADS2-VASc score is 2-3.  He is at high risk for stroke.  He does not want to take Coumadin.  I had a long discussion with him today regarding stroke risk.  He is agreeable to take Pradaxa and will take 150 mg bid.  His renal fxn has been normal in the past.  Will get bmet, cbc, tsh today.  I will set him up for an echo.  He will come back in 2 weeks to follow up with me or Dr. Riley Kill.  He does not look volume overloaded.  He does not have symptoms of CHF.  I think his BNP is mildly elevated due to his AFib.

## 2011-03-29 NOTE — Assessment & Plan Note (Signed)
No angina.  He can stay on ASA 81 mg qd.

## 2011-03-29 NOTE — Assessment & Plan Note (Signed)
BP somewhat low.  But he is tolerating this just fine.

## 2011-03-29 NOTE — Progress Notes (Signed)
History of Present Illness: Primary Cardiologist:  Dr.  Shawnie Pons  [name] is a 77 y.o. male with a h/o CAD, s/p CABG 1993, HTN and HLP.  He was seen at urgent care yesterday for cough and congestion.  He was placed on Zithromax.  He states that he had a chest x-ray and some blood work and they were concerned about "an infection around his heart."  I did speak with a physician assistant at the urgent care that saw him.  He had fibrosis versus edema on his chest x-ray and a BNP of 122.  The patient was taken off of metoprolol several months ago due to bradycardia.  Shortly after that, he developed rapid heart beats.  He called the office and was placed back on half a tablet of metoprolol.  Since then his heart rates have ranged in the 80s.  He sometimes feels some palpitations.  He denies chest pain.  He denies significant shortness of breath.  He denies orthopnea or PND.  He denies significant pedal edema.  He denies syncope.  Past Medical History  Diagnosis Date  . Knee pain   . CAD (coronary artery disease)     s/p CABG 1993 (L-LAD, S-RI, S-D1);   echo 1/10: EF 55%;    myoview 12/09: inf MI, no ischemia  . Hyperlipidemia   . HTN (hypertension)   . Folliculitis   . Dyspnea   . Hematuria   . Hematochezia   . Obesity   . Osteoarthritis   . Irritable bladder   . Sleep disorder     Current Outpatient Prescriptions  Medication Sig Dispense Refill  . amLODipine (NORVASC) 5 MG tablet Take 1 tablet (5 mg total) by mouth daily.  30 tablet  11  . aspirin 81 MG tablet Take 81 mg by mouth daily.        Marland Kitchen atorvastatin (LIPITOR) 10 MG tablet Take 10 mg by mouth daily.        . benazepril (LOTENSIN) 40 MG tablet Take 40 mg by mouth daily.        . Cholecalciferol (VITAMIN D) 1000 UNITS capsule Take 1,000 Units by mouth daily.        . Cyanocobalamin (VITAMIN B-12) 1000 MCG SUBL Place under the tongue.        . furosemide (LASIX) 40 MG tablet Take 40 mg by mouth daily.        . metoprolol  succinate (TOPROL-XL) 25 MG 24 hr tablet 1/2 tab po qd       . Multiple Vitamin (MULTIVITAMIN) capsule Take 1 capsule by mouth daily.        Marland Kitchen DISCONTD: simvastatin (ZOCOR) 40 MG tablet Take 40 mg by mouth at bedtime.       . dabigatran (PRADAXA) 150 MG CAPS Take 1 capsule (150 mg total) by mouth every 12 (twelve) hours.  60 capsule  11  . Omega-3 Fatty Acids (FISH OIL) 1000 MG CAPS 1 tab po bid       . DISCONTD: amLODipine (NORVASC) 5 MG tablet Take 1 tablet (5 mg total) by mouth daily.  30 tablet  11  . DISCONTD: atorvastatin (LIPITOR) 20 MG tablet Take 20 mg by mouth daily.        Marland Kitchen DISCONTD: Calcium 600-200 MG-UNIT per tablet Take 1 tablet by mouth 2 (two) times daily.        Marland Kitchen DISCONTD: vitamin B-12 (CYANOCOBALAMIN) 1000 MCG tablet Take 1,000 mcg by mouth daily.  No Known Allergies  History  Substance Use Topics  . Smoking status: Former Games developer  . Smokeless tobacco: Not on file  . Alcohol Use: Not on file    ROS:  See history of present illness.  He denies fevers, chills.  He denies melena or hematochezia.  All other systems reviewed and negative.  Vital Signs: BP 90/60  Pulse 83  Ht 5\' 10"  (1.778 m)  Wt 215 lb (97.523 kg)  BMI 30.85 kg/m2  PHYSICAL EXAM: Well nourished, well developed, in no acute distress HEENT: normal Neck: no JVD Endocrine: no thyromegaly Cardiac:  normal S1, S2; Irregularly irregular, no murmur Lungs:  clear to auscultation bilaterally, no wheezing, rhonchi or rales Abd: soft, nontender, no hepatomegaly Ext: no edema Skin: warm and dry Neuro:  CNs 2-12 intact, no focal abnormalities noted Psych: Normal affect  EKG:  Atrial fibrillation, heart rate 83, normal axis, nonspecific ST-T wave changes  ASSESSMENT AND PLAN:

## 2011-04-06 ENCOUNTER — Encounter: Payer: Self-pay | Admitting: Physician Assistant

## 2011-04-08 ENCOUNTER — Ambulatory Visit (HOSPITAL_COMMUNITY): Payer: Medicare Other | Attending: Cardiology | Admitting: Radiology

## 2011-04-08 DIAGNOSIS — I4891 Unspecified atrial fibrillation: Secondary | ICD-10-CM

## 2011-04-08 DIAGNOSIS — I25810 Atherosclerosis of coronary artery bypass graft(s) without angina pectoris: Secondary | ICD-10-CM

## 2011-04-08 DIAGNOSIS — I2510 Atherosclerotic heart disease of native coronary artery without angina pectoris: Secondary | ICD-10-CM

## 2011-04-12 ENCOUNTER — Ambulatory Visit (INDEPENDENT_AMBULATORY_CARE_PROVIDER_SITE_OTHER): Payer: Medicare Other | Admitting: Physician Assistant

## 2011-04-12 ENCOUNTER — Encounter: Payer: Self-pay | Admitting: Physician Assistant

## 2011-04-12 VITALS — BP 106/78 | HR 70 | Ht 71.0 in | Wt 218.4 lb

## 2011-04-12 DIAGNOSIS — I519 Heart disease, unspecified: Secondary | ICD-10-CM

## 2011-04-12 DIAGNOSIS — I2510 Atherosclerotic heart disease of native coronary artery without angina pectoris: Secondary | ICD-10-CM

## 2011-04-12 DIAGNOSIS — I4891 Unspecified atrial fibrillation: Secondary | ICD-10-CM

## 2011-04-12 LAB — CBC WITH DIFFERENTIAL/PLATELET
Basophils Absolute: 0 10*3/uL (ref 0.0–0.1)
Eosinophils Relative: 3.1 % (ref 0.0–5.0)
HCT: 42.8 % (ref 39.0–52.0)
Hemoglobin: 14.9 g/dL (ref 13.0–17.0)
Lymphocytes Relative: 31.5 % (ref 12.0–46.0)
Lymphs Abs: 2.3 10*3/uL (ref 0.7–4.0)
Monocytes Relative: 8.9 % (ref 3.0–12.0)
Neutro Abs: 4.1 10*3/uL (ref 1.4–7.7)
Platelets: 200 10*3/uL (ref 150.0–400.0)
RDW: 13.4 % (ref 11.5–14.6)
WBC: 7.2 10*3/uL (ref 4.5–10.5)

## 2011-04-12 NOTE — Patient Instructions (Signed)
Your physician recommends that you schedule a follow-up appointment in: 3-4 WEEKS WITH DR. Riley Kill , THIS NEEDS TO BE WITH DR. Riley Kill AND NOT WITH THE PA, AS PER SCOTT WEAVER, PA-C,   Your physician recommends that you return for lab work in: TODAY CBC 427.31, 429.9.  Your physician has requested that you have a lexiscan Myoview DX 427.31, 429.9 WITHIN THE NEXT 1-2 WEEKS For further information please visit [email or web address] Please follow instruction sheet, as given.

## 2011-04-12 NOTE — Progress Notes (Signed)
History of Present Illness: Primary Cardiologist:  Dr.  Shawnie Pons  [name] is a 77 y.o. male with a h/o CAD, s/p CABG 1993, HTN and HLP.  I saw him recently with new onset atrial fibrillation.  After some discussion with the patient, I placed him on Pradaxa for stroke prophylaxis.  He had an echo that demonstrated an EF 45-50% which is slightly lower than previous.  His last myoview was in 2009.  I want him to get another Myoview.  His rate was controlled and his beta blocker dose was not changed.  He returns for follow up.  He is doing well.  He is still somewhat concerned about taking blood thinners.  I had a long discussion with him to today Re: the reasons for using blood thinners and atrophic relation to prevent stroke and when the risks of bleeding would outweigh the risk of stroke.  He denies shortness of breath.  He denies chest pain.  He denies syncope.  He denies orthopnea or PND.  He denies melena, hematochezia, hemoptysis, epistaxis or hematemesis.  Past Medical History  Diagnosis Date  . Knee pain   . CAD (coronary artery disease)     s/p CABG 1993 (L-LAD, S-RI, S-D1);   echo 1/10: EF 55%;    myoview 12/09: inf MI, no ischemia  . Hyperlipidemia   . HTN (hypertension)   . Folliculitis   . Dyspnea   . Hematuria   . Hematochezia   . Obesity   . Osteoarthritis   . Irritable bladder   . Sleep disorder   . Hx of colonoscopy     Current Outpatient Prescriptions  Medication Sig Dispense Refill  . amLODipine (NORVASC) 5 MG tablet Take 1 tablet (5 mg total) by mouth daily.  30 tablet  11  . aspirin 81 MG tablet Take 81 mg by mouth daily.        Marland Kitchen atorvastatin (LIPITOR) 10 MG tablet Take 10 mg by mouth daily.        . benazepril (LOTENSIN) 40 MG tablet Take 40 mg by mouth daily.        . Calcium Carbonate-Vitamin D (CALCIUM-VITAMIN D) 600-200 MG-UNIT CAPS Take 1 tablet by mouth 2 (two) times daily.        . Cholecalciferol (VITAMIN D) 1000 UNITS capsule Take 1,000 Units by  mouth daily.        . Cyanocobalamin (VITAMIN B-12) 1000 MCG SUBL Place under the tongue.        . dabigatran (PRADAXA) 150 MG CAPS Take 1 capsule (150 mg total) by mouth every 12 (twelve) hours.  60 capsule  11  . furosemide (LASIX) 40 MG tablet Take 40 mg by mouth daily.        . metoprolol succinate (TOPROL-XL) 25 MG 24 hr tablet 1/2 tab po qd       . Multiple Vitamin (MULTIVITAMIN) capsule Take 1 capsule by mouth daily.        . Omega-3 Fatty Acids (FISH OIL) 1000 MG CAPS 1 tab po bid         No Known Allergies  Vital Signs: BP 106/78  Pulse 70  Ht 5\' 11"  (1.803 m)  Wt 218 lb 6.4 oz (99.066 kg)  BMI 30.46 kg/m2  PHYSICAL EXAM: Well nourished, well developed, in no acute distress HEENT: normal Neck: no JVD Cardiac:  normal S1, S2; Irregularly irregular, no murmur Lungs:  clear to auscultation bilaterally, no wheezing, rhonchi or rales Abd: soft, nontender Ext:  no edema Skin: warm and dry Neuro:  CNs 2-12 intact, no focal abnormalities noted  EKG:  Atrial fibrillation, heart rate 70, normal axis, PVC, No acute changes  ASSESSMENT AND PLAN:

## 2011-04-12 NOTE — Assessment & Plan Note (Addendum)
He remains on Pradaxa.  He says he did miss one dose only.  His rate is well controlled.  He is asymptomatic.  I discussed with him the possibility of proceeding to elective cardioversion should he remain in atrial fibrillation.  I will have him followup with Dr. Tedra Senegal in 3-4 weeks.  He can discuss this with him at that time if he remains in atrial fibrillation.  Check a cbc today as he has been on Pradaxa for over 2 weeks.

## 2011-04-12 NOTE — Assessment & Plan Note (Signed)
His EF was slightly lower on his recent echocardiogram.  He's not having any symptoms of angina.  However, given the lower ejection fraction, I have recommended that he proceed with a Lexiscan Myoview.

## 2011-04-23 ENCOUNTER — Ambulatory Visit (HOSPITAL_COMMUNITY): Payer: Medicare Other | Attending: Cardiology | Admitting: Radiology

## 2011-04-23 DIAGNOSIS — I2510 Atherosclerotic heart disease of native coronary artery without angina pectoris: Secondary | ICD-10-CM

## 2011-04-23 DIAGNOSIS — I4891 Unspecified atrial fibrillation: Secondary | ICD-10-CM | POA: Insufficient documentation

## 2011-04-23 DIAGNOSIS — I25810 Atherosclerosis of coronary artery bypass graft(s) without angina pectoris: Secondary | ICD-10-CM

## 2011-04-23 DIAGNOSIS — I519 Heart disease, unspecified: Secondary | ICD-10-CM

## 2011-04-23 DIAGNOSIS — I4949 Other premature depolarization: Secondary | ICD-10-CM

## 2011-04-23 DIAGNOSIS — R0602 Shortness of breath: Secondary | ICD-10-CM

## 2011-04-23 MED ORDER — REGADENOSON 0.4 MG/5ML IV SOLN
0.4000 mg | Freq: Once | INTRAVENOUS | Status: AC
  Administered 2011-04-23: 0.4 mg via INTRAVENOUS

## 2011-04-23 MED ORDER — TECHNETIUM TC 99M TETROFOSMIN IV KIT
33.0000 | PACK | Freq: Once | INTRAVENOUS | Status: AC | PRN
  Administered 2011-04-23: 33 via INTRAVENOUS

## 2011-04-23 MED ORDER — TECHNETIUM TC 99M TETROFOSMIN IV KIT
10.7000 | PACK | Freq: Once | INTRAVENOUS | Status: AC | PRN
  Administered 2011-04-23: 11 via INTRAVENOUS

## 2011-04-23 NOTE — Assessment & Plan Note (Signed)
[facility]                                 ON-CALL NOTE   TALLAN, SANDOZ                            MRN:          [id number]  DATE:04/25/2008                            DOB:          1930/12/17    Time 5:23 p.m.   PHONE NUMBER:  [phone].   OBJECTIVE:  The patient had medicines called in to the wrong pharmacy.  Got no answer 3 times in a row trying to call him back.  Called [facility] and  spoke to Dr. Debby Bud' nurse, who looked up on the computer, and the  patient's prescription were called in to [facility], and it looks like  his pharmacy is really [facility] and the nurse volunteered to call in the  prescriptions either tonight or tomorrow morning.  The patient had, by  the way, called there at the office as well.   PRIMARY CARE Traivon Morrical:  [name], MD, and the home office is  [facility].     Arta Silence, MD  Electronically Signed    RNS/MedQ  DD: 04/25/2008  DT: 04/25/2008  Job #: [id number]

## 2011-04-23 NOTE — Progress Notes (Signed)
[facility] SITE 3 NUCLEAR MED [address] [place] [address] [phone]  Cardiology Nuclear Med Study  [name] is a 77 y.o. male [id number] 26-Sep-1931   Nuclear Med Background Indication for Stress Test:  Evaluation for Ischemia, Surgical Clearance:? pending cardioversion for AFIB,and  Graft Patency  History:  '92 CABG: x4, 04/08/11 Echo: EF 45-50%, '92 Myocardial Infarction: IWMI and 12/09 Myocardial Perfusion Study: EF 42% (-) ischemia Cardiac Risk Factors:Strong Family History - CAD, History of Smoking, Hypertension and Lipids  Symptoms:  DOE, Palpitations and SOB   Nuclear Pre-Procedure Caffeine/Decaff Intake:  None NPO After: 10:00pm   Lungs:  clear IV 0.9% NS with Angio Cath:  20g  IV Site: R Antecubital  IV Started by:  Irean Hong, RN  Chest Size (in):  44 Cup Size: n/a  Height: 5\' 11"  (1.803 m)  Weight:  214 lb (97.07 kg)  BMI:  Body mass index is 29.85 kg/(m^2). Tech Comments:  Held metoprolol x 24 hrs    Nuclear Med Study 1 or 2 day study: 1 day  Stress Test Type:  Eugenie Birks  Reading MD: Arvilla Meres, MD  Order Authorizing Provider:  T.Stuckey  Resting Radionuclide: Technetium 54m Tetrofosmin  Resting Radionuclide Dose: 10.7 mCi   Stress Radionuclide:  Technetium 33m Tetrofosmin  Stress Radionuclide Dose: 33 mCi           Stress Protocol Rest HR: 58 Stress HR: 75  Rest BP: 97/73 Stress BP: 107/64  Exercise Time (min): n/a METS: n/a   Predicted Max HR: 143 bpm % Max HR: 52.45 bpm Rate Pressure Product: 8025   Dose of Adenosine (mg):  n/a Dose of Lexiscan: 0.4 mg  Dose of Atropine (mg): n/a Dose of Dobutamine: n/a mcg/kg/min (at max HR)  Stress Test Technologist: Milana Na, EMT-P  Nuclear Technologist:  Domenic Polite, CNMT     Rest Procedure:  Myocardial perfusion imaging was performed at rest 45 minutes following the intravenous administration of Technetium 10m Tetrofosmin. Rest ECG: Atrial  Fibrilliation  Stress Procedure:  The patient received IV Lexiscan 0.4 mg over 15-seconds.  Technetium 5m Tetrofosmin injected at 30-seconds.  There were no significant changes and rare pvcs with Lexiscan.  Quantitative spect images were obtained after a 45 minute delay. Stress ECG: No significant change from baseline ECG  QPS Raw Data Images:  Normal; no motion artifact; normal heart/lung ratio. Stress Images:  There is decreased uptake in the inferior wall and mid anterior wall.  Rest Images:  There is decreased uptake in the inferior wall. Subtraction (SDS):  There is a fixed inferior defect suggestive of previous infarct. There is a mild reversible defect in the mid anterior wall suggestive of ischemia. Transient Ischemic Dilatation (Normal <1.22):  1.06 Lung/Heart Ratio (Normal <0.45):  0.37  Quantitative Gated Spect Images QGS EDV:  n/a QGS ESV:  n/a QGS cine images:  study not gated due to AF QGS EF: Study not gated  Impression Exercise Capacity:  Lexiscan with no exercise. BP Response:  n/a Clinical Symptoms:  n/a ECG Impression:  No significant ST segment change suggestive of ischemia. Comparison with Prior Nuclear Study: Inferior scar is old. Anterior ischemia is new.  Overall Impression:  Abnormal stress nuclear study.There is a fixed inferior defect suggestive of previous infarct. There is a mild reversible defect in the mid anterior wall suggestive of ischemia.    [name]

## 2011-04-23 NOTE — Letter (Signed)
November 11, 2008    [name], MD  [address]  [place], [place] [address]   RE:  Clayton, [name]  MRN:  [id number]  /  DOB:  09-01-1931   Dear Kathlene November,   I had the pleasure of seeing [name] in the office today in followup.  Cardiac-wise he seems to be getting along reasonably well.  As you know,  this gentleman underwent revascularization surgery.  In 1992, at an  internal mammary to the LAD, saphenous vein graft to the diagonal, and  ramus, and saphenous vein graft to the D1.  He has not had recurrent  chest discomfort.  He does unfortunately has documented that his blood  pressure drops after exercise.  He says he feels a little woozy and has  had his blood pressure checked with exercise.  It seems to drop the  explanation if this is unclear.  Cardiac-wise, he has not been having  chest pain.   CURRENT MEDICATIONS:  1. Multivitamin daily.  2. Calcium 600 daily.  3. Lasix 40 mg q. a.m.  4. Benazepril 40 mg daily.  5. Aspirin 81 mg daily.  6. Potassium daily.  7. Vitamin E daily.  8. Fish oil 1000 mg daily.  9. Metoprolol 25 mg daily.  10.Amlodipine 5 mg daily.  11.Simvastatin 40 mg nightly.   On physical, the blood pressure was 124/84 and the pulse was 54.  The  lung fields were clear and the cardiac rhythm was regular.   The electrocardiogram demonstrates sinus bradycardia with first-degree  AV block.  PR interval of 210 milliseconds.   In general, he is doing reasonably well, but I am concerned about the  blood pressure drop.  He is 17 years following revascularization  surgery, and based on his semi positive stress test with blood pressure  dropped after exertion, I thought it would be most important to get a  radionuclide imaging study even despite his lack of symptoms.  He is  agreeable to this and we will make arrangements for this, now I will see  him back in followup.  We will also check a basic profile.  I appreciate  the opportunity of sharing in his  care.    Sincerely,      [name]. Riley Kill, MD, [facility]  Electronically Signed    TDS/MedQ  DD: 11/11/2008  DT: 11/12/2008  Job #: [id number]

## 2011-04-23 NOTE — Assessment & Plan Note (Signed)
[facility]                            CARDIOLOGY OFFICE NOTE   [name], [name]                          MRN:          [id number]  DATE:12/20/2008                            DOB:          1931/12/07    [name] is in for a followup visit.  To briefly summarize, he is  stable.  Repeat echocardiogram suggested that the overall left  ventricular function was at the lower limits of normal.  RV size was  normal.  Estimated RV systolic pressure was 27.   Overall, he feels well.  The patient underwent radionuclide imaging  study.  The calculated EF was 42, but the overall LV function was  thought to be normal by review of the raw images.  There was no  significant ischemia.  Echo was recommended at that time, and was done  today.  He currently is without complaints.   Medications include:  1. Multivitamin 1 daily.  2. Calcium D daily.  3. Lasix 40 mg q.a.m.  4. Benazepril 40 mg daily.  5. Aspirin 81 mg daily.  6. Potassium.  7. Vitamin daily, vitamin E and C.  8. Fish oil 1000 mg daily.  9. Metoprolol 25 mg daily.  10.Amlodipine 5 mg daily.  11.Simvastatin 40 mg nightly.   On physical, he appears well.  He denies any ongoing chest pain.  His  Cardiolite is nonischemic and it appears that his overall LV function is  preserved.  His recent renal function studies were normal.  We will wait  the final report.  At the present time, it would appear that we will  continue to treat him medically.     [name]. Riley Kill, MD, [facility]  Electronically Signed    TDS/MedQ  DD: 12/29/2008  DT: 12/29/2008  Job #: [id number]

## 2011-04-23 NOTE — Assessment & Plan Note (Signed)
[facility]                            CARDIOLOGY OFFICE NOTE   ORRIS, PERIN                          MRN:          [id number]  DATE:07/13/2008                            DOB:          [birth year]    PRIMARY CARDIOLOGIST:  [name]. Riley Kill, MD, [facility]   PRIMARY CARE PHYSICIAN:  [name], MD   This is for cardiac clearance to undergo arthroscopic knee surgery.   HISTORY OF PRESENT ILLNESS:  This is a 77 year old white male patient of  Dr. Riley Kill who has history of coronary artery disease status post CABG  in 1992.  His last stress Myoview was in 2006, which was a borderline  negative stress test with borderline LV dilatation, slightly impaired LV  systolic function, minor inferior wall scarring with normal perfusion in  other segments in spite of any distal inferior wall ischemia that was  unchanged from prior thallium in 1992.   The patient has been doing extremely well.  [facility]  He can walk on a treadmill for 10 minutes before his knee gives  out and then he does waits.  He denies any chest pain, palpitations,  dyspnea, dyspnea on exertion, dizziness or presyncope.  Most of his  symptoms are due to his knee pain.  He has been on a beta-blocker.  He  has not had any problems.   CURRENT MEDICATIONS:  1. Multivitamin daily.  2. Calcium D 600 mg daily.  3. Lasix 40 mg daily.  4. Benazepril 40 mg daily.  5. Aspirin 81 mg daily.  6. Potassium daily.  7. Vitamin E and C daily.  8. Fish oil 1000 mg daily.  9. Metoprolol 25 mg daily.  10.Amlodipine 5 mg daily.   PAST MEDICAL HISTORY:  Significant for osteoarthritis, cholecystectomy,  and prior history of tobacco use.   FAMILY HISTORY:  Positive for CVA in his father, possible MI in his  father, mother with diabetes, 3 brothers, 1 died of an MI.   SOCIAL HISTORY:  He is married and has 1 son, retired.  He quit smoking.   REVIEW OF SYSTEMS:  Negative for  dizziness or presyncopal signs or  symptoms, dyspepsia, dysphagia, change in bowels or melena.  Cardiopulmonary,  Please see HPI.  He has significant pain in his knee  with exertion especially going downstairs.   PHYSICAL EXAMINATION:  GENERAL:  A pleasant 77 year old white male in no  acute distress.  VITAL SIGNS:  Blood pressure 105/64, pulse 54, and weight 239.  NECK:  Without JVD, HJR bruit, or thyroid enlargement.  LUNGS:  Clear anterior, posterior, and lateral.  HEART:  Regular rate and rhythm at 54 beats per minute.  Normal S1 and  S2.  No murmur, rub, bruit, thrill, or heave noted.  ABDOMEN:  Soft without organomegaly, masses, lesions, or abnormal  tenderness.  EXTREMITIES:  Without cyanosis, clubbing, or edema.  There is good  distal pulses.  NEUROLOGIC:  Without focal deficit.   EKG:  Sinus bradycardia, otherwise normal   IMPRESSION:  1.  Need for right knee arthroscopic surgery.  2. Coronary artery disease, status post coronary artery bypass graft      in 1992 with a left internal mammary artery to the left anterior      descending, saphenous vein graft to the diagonal to R1 and      saphenous vein graft to the diagonal 1.  3. Hyperlipidemia treated, does not have his medicine with him.  4. Prior history of tobacco abuse.  5. Osteoarthritis.   PLAN:  I discussed this patient with Dr. Antoine Poche.  He feels because he  is asymptomatic at a pretty high functional level, no further testing is  needed prior to surgery.  He is at moderate risk because of a history of  coronary artery disease, but may proceed with arthroscopic knee surgery.      Jacolyn Reedy, PA-C  Electronically Signed      Rollene Rotunda, MD, [facility]  Electronically Signed   ML/MedQ  DD: 07/13/2008  DT: 07/14/2008  Job #: [id number]   cc:   Molly Maduro A. Thurston Hole, M.D.  Loreta Ave, M.D.

## 2011-04-24 ENCOUNTER — Other Ambulatory Visit (HOSPITAL_COMMUNITY): Payer: Medicare Other | Admitting: Radiology

## 2011-04-24 NOTE — Progress Notes (Signed)
Copy routed to Dr. Riley Kill.[name]

## 2011-04-24 NOTE — Progress Notes (Signed)
I spoke with Dr Riley Kill by phone about the results of this pt's myoview.  The pt is scheduled to see Dr Riley Kill on 05/02/11 but he would like the pt's appointment moved up to 05/01/11 (TS first day back in the office).  I made the pt aware of this information and Lela will contact the pt with a new appointment time.

## 2011-04-25 ENCOUNTER — Telehealth: Payer: Self-pay | Admitting: Physician Assistant

## 2011-04-25 NOTE — Telephone Encounter (Signed)
lvmom for ptcb for test results and to get appt. [name]

## 2011-04-25 NOTE — Telephone Encounter (Signed)
Stress test is abnormal. He needs an appointment with Dr. Riley Kill in the next week to discuss.  If Dr. Riley Kill does not have anything, schedule him with me on a day Dr. Riley Kill is there. We may need to set him up for a cath. Make sure nuclear images are available for Dr. Riley Kill (or me) to review at the time of the appointment. He was not having any chest pain.  If he develops any chest pain or shortness of breath, go to the ED. Make sure he has NTG to use PRN. Tereso Newcomer, PA-C

## 2011-04-26 ENCOUNTER — Other Ambulatory Visit: Payer: Self-pay | Admitting: Physician Assistant

## 2011-04-26 ENCOUNTER — Telehealth: Payer: Self-pay | Admitting: *Deleted

## 2011-04-26 DIAGNOSIS — R079 Chest pain, unspecified: Secondary | ICD-10-CM

## 2011-04-26 MED ORDER — NITROGLYCERIN 0.4 MG SL SUBL: 0.4000 mg | SUBLINGUAL_TABLET | SUBLINGUAL | Status: DC | PRN

## 2011-04-26 NOTE — Telephone Encounter (Signed)
Ok

## 2011-04-26 NOTE — Telephone Encounter (Signed)
See phone note

## 2011-04-26 NOTE — Letter (Signed)
November 25, 2006    [name], MD  [address]  [place],  [place] [address]   RE:  [name], [name]  MRN:  [id number]  /  DOB:  Jun 26, 1931   Dear Kathlene November:   It was a pleasure to speak with you today regarding [name]. As I  mentioned, he exercised today on the Bruce protocol for about 6 minutes.  He did have a hypertensive response, and there was a little bit of  ventricular ectopy with some borderline ST flattening. There was no  significant horizontal ST depression.   Perhaps more importantly, the patient had his liver function studies  done in combination with __________  lipids. His LDL cholesterol was 56,  with a total cholesterol of 126. His triglycerides were mildly elevated  at 176, with a low HDL of 34. His SGOT was 66 and SGPT was 72.   We have taken several measures. First, we have added Toprol XL 25 mg to  his regimen. The specific reason for this is to try to reduce his  exercise-induced hypertension. Secondly, I have told him about the  concerns regarding the elevated liver functions, which were not that way  in early 2006. As a result, we have taken the liberty of decreasing his  dose to 10/20 of Vytorin daily. I have asked him to follow up with you  in about 6 weeks for repeat lipid and liver, and also,  to recheck his blood pressure. Hopefully, these will resolve the issue.  He is 16 years following revascularization surgery, and should there be  any significant changes in symptoms, he is to contact us directly.  Thanks so much for allowing me to share in his care. I hope this  information is helpful.    Sincerely,      [name]. Riley Kill, MD, [facility]  Electronically Signed    TDS/MedQ  DD: 11/25/2006  DT: 11/25/2006  Job #: [id number]

## 2011-04-26 NOTE — Assessment & Plan Note (Signed)
[facility]                           PRIMARY CARE OFFICE NOTE   GARI, HARTSELL                          MRN:          [id number]  DATE:03/10/2007                            DOB:          [birth year]    Mr. Earhart was last seen on March 09, 2007, where he was diagnosed with  acute asthmatic bronchitis, started on trimethoprim sulfamethoxazole,  steroids and ProAir.  He actually has seen some improvement in 24 hours  and is feeling better.   Leg pain:  The patient continues to have pain at the proximal left lower  extremity.  He describes it as at the top of his leg but on further  examination, he really seems to have discomfort both on top and an area  of paresthesia to the lateral aspect of his leg.  He has brought x-rays  from his chiropractor, which showed preserved intervertebral body space  at the LS spine.   PAST MEDICAL HISTORY:  Surgical:  1. Mole removal in 2001.  2. Cholecystectomy in 1992.  3. Coronary artery bypass surgery in 1992 with LIMA to LAD, SVG to D-2      R-1 and SVG to D-1.   Medical:  1. The patient had the usual childhood disease.  2. CAD.  3. Transient atrial fibrillation.  4. Arthritis.  5. History of tobacco abuse.   CURRENT MEDICATIONS:  1. Multivitamin daily.  2. Calcium D 600 mg daily.  3. Lasix 40 mg q.a.m.  4. Benazepril 40 mg daily.  5. Aspirin 81 mg daily.  6. Potassium and vitamins daily.  7. Vytorin 10/20 mg q.h.s.  8. Metoprolol 25 mg daily.  9. Zolpidem 10 mg q.h.s. p.r.n.   FAMILY HISTORY:  Positive for CVA in his father, possible MI in his  father.  Mother with diabetes.  There are three brothers.  One died of  an MI.  A maternal uncle with colon cancer.   SOCIAL HISTORY:  The patient is married for 51 years.  He has one son.  He is retired.  His wife's health is stable.   CHART REVIEW:  Last colonoscopy was March 26, 2004, and was  unremarkable.  The patient would be a candidate for  follow-up in 10  years.  The patient last saw Dr. Riley Kill in November 2007 and was  cardiac-stable.   The patient had hand films done July 01, 2005, which showed severe  osteoarthritis in the wrist on the left, mild osteoarthritis in the  hand.  On the right there was moderate osteoarthritis on the wrist, mild  osteoarthritis in the hand, and an old healed distal radial fracture.  The patient had a CT abdomen in May 2006, which was unremarkable.  Last  exercise treadmill was November 25, 2006, with Dr. Riley Kill, which was  thought to be a negative study without any evidence of acute coronary  obstruction.  Beta blockers were added at that time.   REVIEW OF SYSTEMS:  The patient reports no fever or chills.  He has had  an eye exam in the last 12  months.  No dental problems.  No  cardiovascular complaints.  RESPIRATORY:  Per the HPI with resolving  asthmatic bronchitis.  No GI, GU or musculoskeletal complaints.   PHYSICAL EXAMINATION:  VITAL SIGNS:  Temperature is 97.2, blood pressure  132/73, pulse of 69.  Weight was not obtained.  GENERAL APPEARANCE:  This is an overweight Caucasian gentleman in no  acute distress.  HEENT:  Normocephalic, atraumatic.  EACs and TMs were unremarkable.  The  patient has full dentures in place.  No buccal lesions were noted.  Posterior pharynx was clear.  Conjunctivae and sclerae were clear.  Pupils equal, round and reactive to light and accommodation.  Funduscopic exam deferred to ophthalmology.  NECK:  Supple.  There was no thyromegaly.  NODES:  No adenopathy was noted in the cervical, supraclavicular or  inguinal regions.  CHEST:  No deformities.  Mildly increased AP diameter.  A well-healed  sternotomy scar is noted.  LUNGS:  The patient has minimal rhonchi.  He has a faint end-expiratory  wheeze.  There is no increased work of breathing.  CARDIOVASCULAR:  2+ radial pulse, no JVD, no carotid bruits.  He had a  quiet precordium with regular rate and  rhythm with no murmurs, rubs or  gallops.  ABDOMEN:  Obese, nontender, with no organosplenomegaly.  GENITOURINARY:  Uncircumcised male, bilaterally descended testicles  without masses.  RECTAL:  Normal sphincter tone was noted.  The prostate was smooth and  of normal size and contour without nodules.  EXTREMITIES:  Without clubbing, cyanosis, or edema.  No deformities were  noted.  NEUROLOGIC:  The patient has mild decreased sensation to pinprick in a  band at the lateral aspect of his left thigh.  DERMATOLOGIC:  The patient had no obvious dermatologic lesions.   LABORATORY DATA:  Cholesterol 139, triglycerides 343, HDL was 32.3, LDL  69.4.  Liver functions were essentially normal with an SGOT of 39, SGPT  44.  Hemoglobin A1c shows good control at 6.5%.   ASSESSMENT AND PLAN:  1. Cardiovascular:  The patient is stable with no active chest pain or      chest discomfort.  He is current with his follow-up with Dr. Bonnee Quin.  Last exercise treadmill as noted.  2. Hypertension:  The patient's blood pressure is adequately      controlled on his present medical regimen and he will continue the      same.  3. Lipids:  Patient with excellent control with his LDL definitely at      goal.  Triglycerides are elevated.  The plan is to continue his      Vytorin.  The patient is advised to follow a more lean diet in      regard to his triglycerides.  4. Diabetes:  The patient does have mildly elevated hemoglobin A1c      above normal but definitely within the treatment guidelines.  He      would not require any supplemental medications to manage this.  The      patient is advised to continue to follow a low-sugar to no-sugar      diet and to have moderate carbohydrate intake.  5. Pulmonary:  Patient with asthmatic bronchitis with marked      improvement in 24 hours.  He is to continue on the regimen      prescribed including his antibiotic and steroid use. 6. Neurologic:  Patient with  leg pain.  With it being  anterior thigh      and an area of numbness in the lateral aspect, would be concerned      for neuralgia paresthetica.  The patient currently is on steroids      and this should help relieve some of his discomfort.  If it were to      continue, would want to consider nerve conduction studies to see if      there is any femoral nerve damage.  Would also need to consider the      use of gabapentin for the control of his discomfort.  7. Health maintenance.  The patient is currently up to date with      colorectal cancer screening.  His prostate exam is normal.  I would      not at this point have him return to the lab for a PSA but will be      sure to get this at his next exam.   In summary, this is a very pleasant gentleman whose problems are  outlined above.  I consider him to be medically stable at this time.     Rosalyn Gess Norins, MD  Electronically Signed    MEN/MedQ  DD: 03/11/2007  DT: 03/11/2007  Job #: [id number]   cc:   Jori Moll, G.  [name]. Riley Kill, MD, [facility]

## 2011-04-26 NOTE — Telephone Encounter (Signed)
Pt aware of myoview results and Rx for NTG sent in today to [facility], [place] today. Pt educated on how to use NTG and when he should go to the ED. Pt coming in 05/01/11 w/TS @ 3 PM.

## 2011-04-26 NOTE — Procedures (Signed)
[name]                              EXERCISE TREADMILL   NAME:[name], Clayton                          MRN:          [id number]  DATE:11/25/2006                            DOB:          1931-04-26    Duration of exercise was 6 minutes.  Maximum heart rate 134.  Percent of  PMHR is 91%.   COMMENTS:  [name] exercised today on the Bruce protocol.  He  experienced no chest pain.  At peak exercise, there was very mild ST  flattening in the inferolateral leads.  This was clearly less than 1 mm.  In recovery, he did have some bigeminal premature ventricular  contractions.  EKG changes resolved.   RISK SUMMATION:  The patient underwent revascularization surgery in  1992.  He has done well in the interim.  His current study does not  suggest high-grade changes.  He did have a hypertensive response to  exercise.  The blood pressures may be reflective in the mild ST changes.  We have added low dose beta blockade to his regimen to reduce his  exercise-induced blood pressure elevation.  He also has excellent lipid  control; however, his LFTs are mildly elevated, and I have talked with  Dr. Debby Bud, and he will see him back in followup in about six weeks to  reassess after we have cut down on his current regimen.     [name]. Riley Kill, MD, [facility]  Electronically Signed    TDS/MedQ  DD: 11/25/2006  DT: 11/25/2006  Job #: [id number]

## 2011-04-26 NOTE — Assessment & Plan Note (Signed)
[facility]                                 ON-CALL NOTE   STEPHONE, GUM                          MRN:          [id number]  DATE:03/07/2007                            DOB:          1931/05/19    TIME OF CALL:  1:44 p.m.   PHONE NUMBER:  [phone].   REGULAR DOCTOR:  Dr. Debby Bud.   CHIEF COMPLAINT:  Congestion.  The patient is having bad nasal and chest  congestion, fever, and wheezing.  He is having a procedure on Tuesday  and is worried about having a cold or being sick.  He was advised to go  to [facility] for physician evaluation, and this is what he  is going to do.     Marne A. Tower, MD  Electronically Signed    MAT/MedQ  DD: 03/07/2007  DT: 03/07/2007  Job #: [id number]   cc:   [name], MD

## 2011-04-26 NOTE — Assessment & Plan Note (Signed)
[facility]                              CARDIOLOGY OFFICE NOTE   [name], [name]                          MRN:          [id number]  DATE:10/20/2006                            DOB:          [birth year]    [name] is in for followup.  He really is doing quite well.  He has been  playing golf.  He exercises at the gym about 3 days a week.  He has not had  any chest pain and continues to work driving cars.   Despite all of this, he has been unable to lose any weight.   MEDICATIONS:  1. Multivitamin 1 daily.  2. Calcium D 600 mg daily.  3. Lasix 40 mg q.a.m.  4. __________ 40 mg daily.  5. Aspirin 325 mg daily.  6. Potassium vitamin daily.  7. Vytorin 10/40 nightly.  8. Vitamin E daily.  9. Fish oil 1000 mg daily.   PHYSICAL EXAMINATION:  Blood pressure is 142/67, the pulse is 74, the weight  is 246 pounds.  There are no carotid bruits.  The lung fields are clear to auscultation and percussion.  The PMI is nondisplaced.  There is normal first and second heart sound  without murmurs, rubs, or gallops.  ABDOMEN:  Soft without hepatosplenomegaly.  No obvious masses are noted and  no widened pulsations appreciated.  EXTREMITIES:  No edema.  Pulses are intact.   Electrocardiogram done today demonstrates normal sinus rhythm with  occasional supraventricular premature beat.   IMPRESSION:  1. Coronary artery disease status post coronary artery bypass graft      surgery in 1993.  2. Hypercholesterolemia with last LDL cholesterol 63 in May of 2006.  3. Hypertension.   PLAN:  1. Reduce aspirin to 81 mg daily.  2. Routine exercise tolerance testing for exercise prescription since he      is quite active and has had prior bypass surgery.  3. Lipid and liver profile.  4. Return to clinic in 1 year after stress test.     [name]. Riley Kill, MD, [facility]  Electronically Signed    TDS/MedQ  DD: 10/20/2006  DT: 10/20/2006  Job #: [id number]

## 2011-04-29 ENCOUNTER — Telehealth: Payer: Self-pay | Admitting: Cardiology

## 2011-04-29 DIAGNOSIS — R079 Chest pain, unspecified: Secondary | ICD-10-CM

## 2011-04-29 NOTE — Telephone Encounter (Signed)
Pt needs nitroglycerin to [facility] [address]

## 2011-04-30 MED ORDER — NITROGLYCERIN 0.4 MG SL SUBL: 0.4000 mg | SUBLINGUAL_TABLET | SUBLINGUAL | Status: DC | PRN

## 2011-05-01 ENCOUNTER — Ambulatory Visit (INDEPENDENT_AMBULATORY_CARE_PROVIDER_SITE_OTHER): Payer: Medicare Other | Admitting: Cardiology

## 2011-05-01 ENCOUNTER — Encounter: Payer: Self-pay | Admitting: Cardiology

## 2011-05-01 DIAGNOSIS — E7800 Pure hypercholesterolemia, unspecified: Secondary | ICD-10-CM

## 2011-05-01 DIAGNOSIS — I25810 Atherosclerosis of coronary artery bypass graft(s) without angina pectoris: Secondary | ICD-10-CM

## 2011-05-01 DIAGNOSIS — I10 Essential (primary) hypertension: Secondary | ICD-10-CM

## 2011-05-01 DIAGNOSIS — I4891 Unspecified atrial fibrillation: Secondary | ICD-10-CM

## 2011-05-01 MED ORDER — DABIGATRAN ETEXILATE MESYLATE 150 MG PO CAPS: 150.0000 mg | ORAL_CAPSULE | Freq: Two times a day (BID) | ORAL | Status: DC

## 2011-05-01 MED ORDER — ATORVASTATIN CALCIUM 10 MG PO TABS: 10.0000 mg | ORAL_TABLET | Freq: Every day | ORAL | Status: DC

## 2011-05-01 NOTE — Assessment & Plan Note (Addendum)
BP is lower at present.  Will cut furosemide in half, and he is to watch his weight.  Needs to check BP at home.  Will adjust meds if BP is low.

## 2011-05-01 NOTE — Progress Notes (Signed)
HPI: Mr. Oak is in for follow up.  He is doing well.  He rarely has chest pain, and usually at rest.  He does not having any exertional symptoms.  He overall is doing well.  We reveiwed in great detail his nuc and echo studies, and I recommended consideration of cath, which he felt he did not want to have after explanation of the risks, benefits, and other options.  He wants to continue medical therapy.  Has hemorrhoids which bleed and did last couple of days, now better.  He says he had this before pradaxa.  See note.  Current Outpatient Prescriptions  Medication Sig Dispense Refill  . amLODipine (NORVASC) 5 MG tablet Take 1 tablet (5 mg total) by mouth daily.  30 tablet  11  . aspirin 81 MG tablet Take 81 mg by mouth daily.        Marland Kitchen atorvastatin (LIPITOR) 10 MG tablet Take 10 mg by mouth daily.        . benazepril (LOTENSIN) 40 MG tablet Take 40 mg by mouth daily.        . Calcium Carbonate-Vitamin D (CALCIUM-VITAMIN D) 600-200 MG-UNIT CAPS Take 1 tablet by mouth 2 (two) times daily.        . Cholecalciferol (VITAMIN D) 1000 UNITS capsule Take 1,000 Units by mouth daily.        . Cyanocobalamin (VITAMIN B-12) 1000 MCG SUBL Place under the tongue.        . dabigatran (PRADAXA) 150 MG CAPS Take 1 capsule (150 mg total) by mouth every 12 (twelve) hours.  60 capsule  11  . furosemide (LASIX) 40 MG tablet Take 40 mg by mouth daily.        . metoprolol succinate (TOPROL-XL) 25 MG 24 hr tablet 1/2 tab po qd       . Multiple Vitamin (MULTIVITAMIN) capsule Take 1 capsule by mouth daily.        . nitroGLYCERIN (NITROSTAT) 0.4 MG SL tablet Place 1 tablet (0.4 mg total) under the tongue every 5 (five) minutes as needed for chest pain.  90 tablet  12  . Omega-3 Fatty Acids (FISH OIL) 1000 MG CAPS 1 tab po bid         No Known Allergies  Past Medical History  Diagnosis Date  . Knee pain   . CAD (coronary artery disease)     s/p CABG 1993 (L-LAD, S-RI, S-D1);   echo 1/10: EF 55%;    myoview 12/09: inf  MI, no ischemia  . Hyperlipidemia   . HTN (hypertension)   . Folliculitis   . Dyspnea   . Hematuria   . Hematochezia   . Obesity   . Osteoarthritis   . Irritable bladder   . Sleep disorder   . Hx of colonoscopy     Past Surgical History  Procedure Date  . Coronary artery bypass graft     graft '92: LIMA-LAD, SVG - D2,R1, D1  . Cholecystectomy     laparoscopic '92  . Mole removal 2001 and 2008    Family History  Problem Relation Age of Onset  . Coronary artery disease Father     and brother  . Heart attack Father     and brother-fatal  . Stroke Father   . Breast cancer Mother   . Colon cancer Maternal Uncle     History   Social History  . Marital Status: Widowed    Spouse Name: N/A    Number of Children:  1  . Years of Education: N/A   Occupational History  . Retired    Social History Main Topics  . Smoking status: Former Games developer  . Smokeless tobacco: Not on file   Comment: quit 1965  . Alcohol Use: Not on file  . Drug Use: Not on file  . Sexually Active: Not on file   Other Topics Concern  . Not on file   Social History Narrative  . No narrative on file    ROS: Please see the HPI.  All other systems reviewed and negative.  PHYSICAL EXAM:  BP 94/60  Pulse 52  Ht 5\' 11"  (1.803 m)  Wt 212 lb (96.163 kg)  BMI 29.57 kg/m2  General: Well developed, well nourished, in no acute distress. Head:  Normocephalic and atraumatic. Neck: no JVD Lungs: Clear to auscultation and percussion. Heart: Normal S1 and S2.1-2/6 SEM.  No diastolic murmur.  No AI murmur.  PMI non displaced.   Abdomen:  Normal bowel sounds; soft; non tender; no organomegaly Recta:  No obvious blood.  Heme negative by me.  Pulses: Pulses normal in all 4 extremities. Extremities: No clubbing or cyanosis. No edema. Neurologic: Alert and oriented x 3.  EKG:  Echo:  Study Conclusions  - Left ventricle: The cavity size was normal. Wall thickness was normal. Systolic function was  mildly reduced. The estimated ejection fraction was in the range of 45% to 50%. Diffuse hypokinesis. - Left atrium: The atrium was mildly dilated.  Nuclear study: Impression  Exercise Capacity: Lexiscan with no exercise.  BP Response: n/a  Clinical Symptoms: n/a  ECG Impression: No significant ST segment change suggestive of ischemia.  Comparison with Prior Nuclear Study: Inferior scar is old. Anterior ischemia is new.  Overall Impression: Abnormal stress nuclear study.There is a fixed inferior defect suggestive of previous infarct. There is a mild reversible defect in the mid anterior wall suggestive of ischemia.  [name]            ASSESSMENT AND PLAN:

## 2011-05-01 NOTE — Assessment & Plan Note (Signed)
Reviewed pradaxa in detail.  We reviewed side effects and possible issue, and also reviewed his CHADS 2 Vasc score with him---4% risk.

## 2011-05-01 NOTE — Patient Instructions (Addendum)
Your physician recommends that you schedule a follow-up appointment in: 6 WEEKS  Your physician has recommended you make the following change in your medication: DECREASE Furosemide to 20mg  daily  Your physician has requested that you regularly monitor and record your blood pressure readings at home. Please use the same machine at the same time of day to check your readings and record them to bring to your follow-up visit.

## 2011-05-01 NOTE — Assessment & Plan Note (Signed)
Currently has abnormal nuc with reduced EF, and no symptoms.  I favored cath given change.  He and I reviewed old study and new study, and echo report.  Findings reviewed in detail.  He does not want this done at present.  If he starts having symptoms, he will consider.

## 2011-05-02 ENCOUNTER — Ambulatory Visit: Payer: Medicare Other | Admitting: Cardiology

## 2011-05-06 NOTE — Progress Notes (Signed)
I had the patient return to the office to review.  We reviewed his studies.  He does not want cardiac catheterization as he is not particularly symptomatic despite results.  We agreed he would be treated medically, although I recommend cath procedure to better identify anatomy.

## 2011-05-09 ENCOUNTER — Ambulatory Visit: Payer: Self-pay | Admitting: Cardiology

## 2011-05-21 ENCOUNTER — Other Ambulatory Visit: Payer: Self-pay | Admitting: Internal Medicine

## 2011-06-18 ENCOUNTER — Ambulatory Visit (INDEPENDENT_AMBULATORY_CARE_PROVIDER_SITE_OTHER): Payer: Medicare Other | Admitting: Cardiology

## 2011-06-18 ENCOUNTER — Encounter: Payer: Self-pay | Admitting: Cardiology

## 2011-06-18 VITALS — BP 107/66 | HR 58 | Resp 18 | Ht 71.0 in | Wt 217.4 lb

## 2011-06-18 DIAGNOSIS — I10 Essential (primary) hypertension: Secondary | ICD-10-CM

## 2011-06-18 DIAGNOSIS — I2510 Atherosclerotic heart disease of native coronary artery without angina pectoris: Secondary | ICD-10-CM

## 2011-06-18 DIAGNOSIS — I4891 Unspecified atrial fibrillation: Secondary | ICD-10-CM

## 2011-06-18 DIAGNOSIS — K625 Hemorrhage of anus and rectum: Secondary | ICD-10-CM

## 2011-06-18 NOTE — Assessment & Plan Note (Signed)
Abnormal echo and nuc recently, but no symptoms.  Wants to defer for medical therapy at present.  Discussed.

## 2011-06-18 NOTE — Progress Notes (Signed)
HPI:  Doing well.  No major problems.  Has hemmorhoids that bleed from time to time.  No chest pain.  Bleeding is minimal.  Staying active.  Plays 18 holes without trouble,works out at the [facility] in [place].    Current Outpatient Prescriptions  Medication Sig Dispense Refill  . amLODipine (NORVASC) 5 MG tablet Take 1 tablet (5 mg total) by mouth daily.  30 tablet  11  . aspirin 81 MG tablet Take 81 mg by mouth daily.        Marland Kitchen atorvastatin (LIPITOR) 10 MG tablet Take 1 tablet (10 mg total) by mouth daily.  90 tablet  3  . benazepril (LOTENSIN) 40 MG tablet Take 40 mg by mouth daily.        . Calcium Carbonate-Vitamin D (CALCIUM-VITAMIN D) 600-200 MG-UNIT CAPS Take 1 tablet by mouth 2 (two) times daily.        . Cholecalciferol (VITAMIN D) 1000 UNITS capsule Take 1,000 Units by mouth daily.        . Cyanocobalamin (VITAMIN B-12) 1000 MCG SUBL Place under the tongue.        . dabigatran (PRADAXA) 150 MG CAPS Take 1 capsule (150 mg total) by mouth every 12 (twelve) hours.  180 capsule  3  . furosemide (LASIX) 40 MG tablet        . metoprolol succinate (TOPROL-XL) 25 MG 24 hr tablet Take 1/2 tablet daily      . Multiple Vitamin (MULTIVITAMIN) capsule Take 1 capsule by mouth daily.        . nitroGLYCERIN (NITROSTAT) 0.4 MG SL tablet Place 1 tablet (0.4 mg total) under the tongue every 5 (five) minutes as needed for chest pain.  90 tablet  12  . Omega-3 Fatty Acids (FISH OIL) 1000 MG CAPS 1 capsule daily.       Marland Kitchen DISCONTD: furosemide (LASIX) 40 MG tablet TAKE 1 TABLET DAILY  30 tablet  4    No Known Allergies  Past Medical History  Diagnosis Date  . Knee pain   . CAD (coronary artery disease)     s/p CABG 1993 (L-LAD, S-RI, S-D1);   echo 1/10: EF 55%;    myoview 12/09: inf MI, no ischemia  . Hyperlipidemia   . HTN (hypertension)   . Folliculitis   . Dyspnea   . Hematuria   . Hematochezia   . Obesity   . Osteoarthritis   . Irritable bladder   . Sleep disorder   . Hx of colonoscopy      Past Surgical History  Procedure Date  . Coronary artery bypass graft     graft '92: LIMA-LAD, SVG - D2,R1, D1  . Cholecystectomy     laparoscopic '92  . Mole removal 2001 and 2008    Family History  Problem Relation Age of Onset  . Coronary artery disease Father     and brother  . Heart attack Father     and brother-fatal  . Stroke Father   . Breast cancer Mother   . Colon cancer Maternal Uncle     History   Social History  . Marital Status: Widowed    Spouse Name: N/A    Number of Children: 1  . Years of Education: N/A   Occupational History  . Retired    Social History Main Topics  . Smoking status: Former Games developer  . Smokeless tobacco: Not on file   Comment: quit 1965  . Alcohol Use: Not on file  . Drug Use:  Not on file  . Sexually Active: Not on file   Other Topics Concern  . Not on file   Social History Narrative  . No narrative on file    ROS: Please see the HPI.  All other systems reviewed and negative.  PHYSICAL EXAM:  BP 107/66  Pulse 58  Resp 18  Ht 5\' 11"  (1.803 m)  Wt 217 lb 6.4 oz (98.612 kg)  BMI 30.32 kg/m2  General: Well developed, well nourished, in no acute distress. Head:  Normocephalic and atraumatic. Neck: no JVD Lungs: Clear to auscultation and percussion. Heart: Normal S1 and S2.  No murmur, rubs or gallops.  Pulses: Pulses normal in all 4 extremities. Extremities: No clubbing or cyanosis. No edema. Neurologic: Alert and oriented x 3.  EKG:  ASSESSMENT AND PLAN:

## 2011-06-18 NOTE — Assessment & Plan Note (Signed)
Currently rate controlled.  No ECG today.  Has occasional bleeding from hemorrhoid.  As such, check CBC.  He does not think significant.  We discussed CHADS and CHADS vasc scoring.  Talked about options with regard to anticoagulation.  He will consider ASA only, but wants to stay on Pradaxa for now.

## 2011-06-18 NOTE — Patient Instructions (Signed)
Your physician recommends that you schedule a follow-up appointment in: 2 months  

## 2011-06-18 NOTE — Assessment & Plan Note (Signed)
Controlled.  Discussed maybe coming off meds, but he says usually about 130 when checked which he does

## 2011-06-19 LAB — CBC WITH DIFFERENTIAL/PLATELET
Basophils Absolute: 0 10*3/uL (ref 0.0–0.1)
Eosinophils Absolute: 0.2 10*3/uL (ref 0.0–0.7)
Eosinophils Relative: 2.2 % (ref 0.0–5.0)
Lymphs Abs: 2.5 10*3/uL (ref 0.7–4.0)
MCV: 96.2 fl (ref 78.0–100.0)
Monocytes Absolute: 0.6 10*3/uL (ref 0.1–1.0)
Neutrophils Relative %: 62 % (ref 43.0–77.0)
Platelets: 178 10*3/uL (ref 150.0–400.0)
RDW: 13.8 % (ref 11.5–14.6)
WBC: 8.6 10*3/uL (ref 4.5–10.5)

## 2011-07-12 ENCOUNTER — Other Ambulatory Visit: Payer: Self-pay | Admitting: Internal Medicine

## 2011-08-16 ENCOUNTER — Ambulatory Visit (INDEPENDENT_AMBULATORY_CARE_PROVIDER_SITE_OTHER): Payer: Medicare Other | Admitting: Cardiology

## 2011-08-16 ENCOUNTER — Encounter: Payer: Self-pay | Admitting: Cardiology

## 2011-08-16 DIAGNOSIS — E785 Hyperlipidemia, unspecified: Secondary | ICD-10-CM

## 2011-08-16 DIAGNOSIS — I4891 Unspecified atrial fibrillation: Secondary | ICD-10-CM

## 2011-08-16 DIAGNOSIS — I2510 Atherosclerotic heart disease of native coronary artery without angina pectoris: Secondary | ICD-10-CM

## 2011-08-16 LAB — BASIC METABOLIC PANEL
Calcium: 9.1 mg/dL (ref 8.4–10.5)
Potassium: 3.9 mEq/L (ref 3.5–5.3)
Sodium: 139 mEq/L (ref 135–145)

## 2011-08-16 NOTE — Progress Notes (Signed)
HPI:  Doing well.  No new issues.  Hgb was stable.  No chest pain.  He is active.  He plays golf three times per week, goes to the gym three times per week, and goes dancing on Friday and Saturday nights.  Enjoys himself.  Has had hemorrhoids for years, and they always have had a very mild bleed.  He is doing well otherwise.  I instructed him carefully on these issues.     Current Outpatient Prescriptions  Medication Sig Dispense Refill  . amLODipine (NORVASC) 5 MG tablet Take 1 tablet (5 mg total) by mouth daily.  30 tablet  11  . aspirin 81 MG tablet Take 81 mg by mouth daily.        Marland Kitchen atorvastatin (LIPITOR) 10 MG tablet Take 1 tablet (10 mg total) by mouth daily.  90 tablet  3  . benazepril (LOTENSIN) 40 MG tablet TAKE 1 TABLET DAILY  30 tablet  5  . Calcium Carbonate-Vitamin D (CALCIUM-VITAMIN D) 600-200 MG-UNIT CAPS Take 1 tablet by mouth 2 (two) times daily.        . Cholecalciferol (VITAMIN D) 1000 UNITS capsule Take 1,000 Units by mouth daily.        . Cyanocobalamin (VITAMIN B-12) 1000 MCG SUBL Place under the tongue.        . dabigatran (PRADAXA) 150 MG CAPS Take 1 capsule (150 mg total) by mouth every 12 (twelve) hours.  180 capsule  3  . furosemide (LASIX) 40 MG tablet 40 mg daily. Taking 1/2 Tab      . metoprolol succinate (TOPROL-XL) 25 MG 24 hr tablet Take 1/2 tablet daily      . Multiple Vitamin (MULTIVITAMIN) capsule Take 1 capsule by mouth daily.        . nitroGLYCERIN (NITROSTAT) 0.4 MG SL tablet Place 1 tablet (0.4 mg total) under the tongue every 5 (five) minutes as needed for chest pain.  90 tablet  12  . Omega-3 Fatty Acids (FISH OIL) 1000 MG CAPS 1 capsule daily.         No Known Allergies  Past Medical History  Diagnosis Date  . Knee pain   . CAD (coronary artery disease)     s/p CABG 1993 (L-LAD, S-RI, S-D1);   echo 1/10: EF 55%;    myoview 12/09: inf MI, no ischemia  . Hyperlipidemia   . HTN (hypertension)   . Folliculitis   . Dyspnea   . Hematuria   .  Hematochezia   . Obesity   . Osteoarthritis   . Irritable bladder   . Sleep disorder   . Hx of colonoscopy     Past Surgical History  Procedure Date  . Coronary artery bypass graft     graft '92: LIMA-LAD, SVG - D2,R1, D1  . Cholecystectomy     laparoscopic '92  . Mole removal 2001 and 2008    Family History  Problem Relation Age of Onset  . Coronary artery disease Father     and brother  . Heart attack Father     and brother-fatal  . Stroke Father   . Breast cancer Mother   . Colon cancer Maternal Uncle     History   Social History  . Marital Status: Widowed    Spouse Name: N/A    Number of Children: 1  . Years of Education: N/A   Occupational History  . Retired    Social History Main Topics  . Smoking status: Former Games developer  .  Smokeless tobacco: Not on file   Comment: quit 1965  . Alcohol Use: Not on file  . Drug Use: Not on file  . Sexually Active: Not on file   Other Topics Concern  . Not on file   Social History Narrative  . No narrative on file    ROS: Please see the HPI.  All other systems reviewed and negative.  PHYSICAL EXAM:  BP 116/76  Pulse 60  Ht 5' 10.5" (1.791 m)  Wt 215 lb 6.4 oz (97.705 kg)  BMI 30.47 kg/m2  General: Well developed, well nourished, in no acute distress. Head:  Normocephalic and atraumatic. Neck: no JVD Lungs: Clear to auscultation and percussion. Heart: Normal S1 and S2.  No murmur, rubs or gallops.  Abdomen:  Normal bowel sounds; soft; non tender; no organomegaly Pulses: Pulses normal in all 4 extremities. Extremities: No clubbing or cyanosis. No edema. Neurologic: Alert and oriented x 3.  EKG:  ASSESSMENT AND PLAN:

## 2011-08-16 NOTE — Patient Instructions (Signed)
Your physician recommends that you schedule a follow-up appointment in: 6 months with Dr. Stuckey The office will mail you a reminder letter 2 months prior appointment date. Your physician recommends that you return for lab work in:  Today BMET. 

## 2011-08-16 NOTE — Assessment & Plan Note (Signed)
See recent workup.  No current symptoms.  His choice is for a conservative treatment approach.

## 2011-08-16 NOTE — Assessment & Plan Note (Signed)
On pradaxa.  No issues.  No GI symptoms.  CHADS2VASC score is 30  (age, vasc, htn).  Remains on drug.   Issues discussed should he develop more bleeding.  Hgb was normal

## 2011-08-16 NOTE — Assessment & Plan Note (Signed)
Last LDL was at target.   

## 2011-09-13 ENCOUNTER — Ambulatory Visit (INDEPENDENT_AMBULATORY_CARE_PROVIDER_SITE_OTHER): Payer: Medicare Other | Admitting: *Deleted

## 2011-09-13 DIAGNOSIS — I10 Essential (primary) hypertension: Secondary | ICD-10-CM

## 2011-09-13 LAB — BASIC METABOLIC PANEL
CO2: 24 mEq/L (ref 19–32)
Chloride: 105 mEq/L (ref 96–112)
Glucose, Bld: 120 mg/dL — ABNORMAL HIGH (ref 70–99)
Potassium: 3.7 mEq/L (ref 3.5–5.1)
Sodium: 140 mEq/L (ref 135–145)

## 2011-11-07 NOTE — Telephone Encounter (Signed)
error 

## 2011-12-10 DIAGNOSIS — K2950 Unspecified chronic gastritis without bleeding: Secondary | ICD-10-CM

## 2011-12-10 DIAGNOSIS — B3781 Candidal esophagitis: Secondary | ICD-10-CM

## 2011-12-10 HISTORY — DX: Candidal esophagitis: B37.81

## 2011-12-10 HISTORY — DX: Unspecified chronic gastritis without bleeding: K29.50

## 2012-01-06 ENCOUNTER — Encounter: Payer: Self-pay | Admitting: Internal Medicine

## 2012-01-06 ENCOUNTER — Ambulatory Visit (INDEPENDENT_AMBULATORY_CARE_PROVIDER_SITE_OTHER): Payer: Medicare Other | Admitting: Internal Medicine

## 2012-01-06 ENCOUNTER — Other Ambulatory Visit (INDEPENDENT_AMBULATORY_CARE_PROVIDER_SITE_OTHER): Payer: Medicare Other

## 2012-01-06 VITALS — BP 120/78 | HR 72 | Temp 97.4°F | Resp 16 | Ht 69.75 in | Wt 222.5 lb

## 2012-01-06 DIAGNOSIS — E785 Hyperlipidemia, unspecified: Secondary | ICD-10-CM

## 2012-01-06 DIAGNOSIS — I2510 Atherosclerotic heart disease of native coronary artery without angina pectoris: Secondary | ICD-10-CM

## 2012-01-06 DIAGNOSIS — K3189 Other diseases of stomach and duodenum: Secondary | ICD-10-CM

## 2012-01-06 DIAGNOSIS — E119 Type 2 diabetes mellitus without complications: Secondary | ICD-10-CM

## 2012-01-06 DIAGNOSIS — K921 Melena: Secondary | ICD-10-CM

## 2012-01-06 DIAGNOSIS — Z23 Encounter for immunization: Secondary | ICD-10-CM

## 2012-01-06 DIAGNOSIS — I10 Essential (primary) hypertension: Secondary | ICD-10-CM

## 2012-01-06 DIAGNOSIS — Z0000 Encounter for general adult medical examination without abnormal findings: Secondary | ICD-10-CM

## 2012-01-06 DIAGNOSIS — I4891 Unspecified atrial fibrillation: Secondary | ICD-10-CM

## 2012-01-06 LAB — HEPATIC FUNCTION PANEL
AST: 28 U/L (ref 0–37)
Albumin: 4.4 g/dL (ref 3.5–5.2)
Alkaline Phosphatase: 46 U/L (ref 39–117)
Bilirubin, Direct: 0.1 mg/dL (ref 0.0–0.3)
Total Bilirubin: 0.6 mg/dL (ref 0.3–1.2)

## 2012-01-06 LAB — CBC WITH DIFFERENTIAL/PLATELET
Basophils Absolute: 0.1 10*3/uL (ref 0.0–0.1)
Basophils Relative: 0.7 % (ref 0.0–3.0)
HCT: 44.6 % (ref 39.0–52.0)
Hemoglobin: 15.4 g/dL (ref 13.0–17.0)
Lymphs Abs: 2.6 10*3/uL (ref 0.7–4.0)
MCHC: 34.6 g/dL (ref 30.0–36.0)
Monocytes Relative: 10.9 % (ref 3.0–12.0)
Neutro Abs: 3.8 10*3/uL (ref 1.4–7.7)
RDW: 14.1 % (ref 11.5–14.6)

## 2012-01-06 LAB — COMPREHENSIVE METABOLIC PANEL
ALT: 27 U/L (ref 0–53)
BUN: 18 mg/dL (ref 6–23)
CO2: 27 mEq/L (ref 19–32)
Creatinine, Ser: 1.1 mg/dL (ref 0.4–1.5)
GFR: 71.01 mL/min (ref >60.00)
Total Bilirubin: 0.6 mg/dL (ref 0.3–1.2)

## 2012-01-06 LAB — LIPID PANEL
Cholesterol: 146 mg/dL (ref 0–200)
Total CHOL/HDL Ratio: 4

## 2012-01-06 MED ORDER — HYDROCORTISONE ACETATE 25 MG RE SUPP: 25.0000 mg | Freq: Two times a day (BID) | RECTAL | Status: AC

## 2012-01-06 NOTE — Patient Instructions (Signed)
Urinary urgency may be an enlarged prostate. Plan - trial of jalyn once a day. If you see real improvement in your symptoms call and a prescription for generic tamsulosin will be call in.  Rectal bleeding - most likely hemorrhoids. Rx sent in for anusol suppository with hydrocortisone. Use twice a day. Continue sitz baths. Will pull your chart - if your haven't had colonoscopy for greater than 5 years will refer you to GI for follow-up exam.  Skin lesion on scalp looks benign.  Swelling in the left leg - a circulation problem of the veins which is benign and is related to heart surgery - vein harvesting.   You look good. Will check routine lab today.

## 2012-01-06 NOTE — Progress Notes (Signed)
Subjective:    Patient ID: [name], male    DOB: 1931/07/05, 78 y.o.   MRN: [id number]  HPI The patient is here for annual Medicare wellness examination and management of other chronic and acute problems.   He reports that he has had rectal bleeding presumably from internal hemorrhoids. He also reports that he will have fecal soiling. He reports that he will have nocturia x 2 and occasionally more.   He will have daytime urgency. No hesitancy, mild decreased force of stream, urgency intermittently, post-void dribble.    The risk factors are reflected in the social history.  The roster of all physicians providing medical care to patient - is listed in the Snapshot section of the chart.  Activities of daily living:  The patient is 100% inedpendent in all ADLs: dressing, toileting, feeding as well as independent mobility  Home safety : The patient has smoke detectors in the home. Fall - has made home fall-safe. No grab bars in bathroom.  They wear seatbelts. firearms are present in the home, kept in a safe fashion. There is no violence in the home.   There is no risks for hepatitis, STDs or HIV. There is no   history of blood transfusion. They have no travel history to infectious disease endemic areas of the world.  The patient has not seen their dentist in the last six month - has full dentures that fit. They have seen their eye doctor in the last year. They deny any hearing difficulty and have not had audiologic testing in the last year.  They do not  have excessive sun exposure. Discussed the need for sun protection: hats, long sleeves and use of sunscreen if there is significant sun exposure.   Diet: the importance of a healthy diet is discussed. They do have a healthy diet.  The patient has a regular exercise program: golf and gym ,  60-75 min duration, 5 per week.  The benefits of regular aerobic exercise were discussed.  Depression screen: there are no signs or vegative symptoms of  depression- irritability, change in appetite, anhedonia, sadness/tearfullness.  Cognitive assessment: the patient manages all their financial and personal affairs and is actively engaged.   The following portions of the patient's history were reviewed and updated as appropriate: allergies, current medications, past family history, past medical history,  past surgical history, past social history  and problem list.  Vision, hearing, body mass index were assessed and reviewed.   During the course of the visit the patient was educated and counseled about appropriate screening and preventive services including : fall prevention , diabetes screening, nutrition counseling, colorectal cancer screening, and recommended immunizations.  Past Medical History  Diagnosis Date  . Knee pain   . CAD (coronary artery disease)     s/p CABG 1993 (L-LAD, S-RI, S-D1);   echo 1/10: EF 55%;    myoview 12/09: inf MI, no ischemia  . Hyperlipidemia   . HTN (hypertension)   . Folliculitis   . Dyspnea   . Hematuria   . Hematochezia   . Obesity   . Osteoarthritis   . Irritable bladder   . Sleep disorder   . Hx of colonoscopy    Past Surgical History  Procedure Date  . Coronary artery bypass graft     graft '92: LIMA-LAD, SVG - D2,R1, D1  . Cholecystectomy     laparoscopic '92  . Mole removal 2001 and 2008   Family History  Problem Relation Age of  Onset  . Coronary artery disease Father     and brother  . Heart attack Father     and brother-fatal  . Stroke Father   . Breast cancer Mother   . Colon cancer Maternal Uncle    History   Social History  . Marital Status: Widowed    Spouse Name: N/A    Number of Children: 1  . Years of Education: N/A   Occupational History  . Retired    Social History Main Topics  . Smoking status: Never Smoker   . Smokeless tobacco: Never Used   Comment: quit 1965  . Alcohol Use: No  . Drug Use: No  . Sexually Active: Not on file   Other Topics Concern    . 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 Married '57- widowed Apr 14, 2023. 1 son -'11- died 'Apr 21, 2023 MVA. Retired: keeps himself busy. ACP - not reviewed (Jan '13)        Review of Systems Constitutional:  Negative for fever, chills, activity change and unexpected weight change.  HEENT:  Negative for hearing loss, ear pain, congestion, neck stiffness and postnasal drip. Negative for sore throat or swallowing problems. Negative for dental complaints.   Eyes: Negative for vision loss or change in visual acuity.  Respiratory: Negative for chest tightness and wheezing. Negative for DOE.   Cardiovascular: Negative for chest pain or palpitations. No decreased exercise tolerance Gastrointestinal: No change in bowel habit. No bloating or gas. No reflux or indigestion. Has fecal soiling. Has hematochezia - hemorrhoidal bleeding. Genitourinary: Positive for urgency, frequency, nocturia, slightly slow stream.   Musculoskeletal: Negative for myalgias, back pain, and gait problem. Has ankle and knee pain that does interfere with walking.  Neurological: Negative for dizziness, tremors, weakness and headaches.  Hematological: Negative for adenopathy.  Psychiatric/Behavioral: Negative for behavioral problems and dysphoric mood.       Objective:   Physical Exam Filed Vitals:   01/06/12 1441  BP: 120/78  Pulse: 72  Temp: 97.4 F (36.3 C)  Resp: 16   Gen'l: Well nourished well developed white male in no acute distress  HEENT: Head: Normocephalic and atraumatic. Right Ear: External ear normal. EAC/TM nl. Left Ear: External ear normal.  EAC/TM nl. Nose: Nose normal. Mouth/Throat: Oropharynx is clear and moist. Dentition - dentures. No buccal lesions. Posterior pharynx clear. Eyes: Conjunctivae and sclera clear. EOM intact. Pupils are equal, round, and reactive to light. Right eye exhibits no discharge. Left eye exhibits no discharge. Neck: Normal range of motion. Neck supple. No JVD  present. No tracheal deviation present. No thyromegaly present.  Cardiovascular: Normal rate, irregularly irregular rhythm, no gallop, no friction rub, no murmur heard.      Quiet precordium. 2+ radial and DP pulses . No carotid bruits Pulmonary/Chest: Effort normal. No respiratory distress or increased WOB, no wheezes, no rales. No chest wall deformity or CVAT. Abdominal: Soft. Bowel sounds are normal in all quadrants. He exhibits no distension, no tenderness, no rebound or guarding, No heptosplenomegaly  Genitourinary:  deferred Musculoskeletal: Normal range of motion. He exhibits no edema and no tenderness.       Small and large joints without redness, synovial thickening or deformity. Full range of motion preserved about all small, median and large joints.  Lymphadenopathy:    He has no cervical or supraclavicular adenopathy.  Neurological: He is alert and oriented to person, place, and time. CN II-XII intact. DTRs 2+ and symmetrical biceps, radial  and patellar tendons. Cerebellar function normal with no tremor, rigidity, normal gait and station.  Skin: Skin is warm and dry. No rash noted. No erythema.  Psychiatric: He has a normal mood and affect. His behavior is normal. Thought content normal.   Lab Results  Component Value Date   WBC 7.5 01/06/2012   HGB 15.4 01/06/2012   HCT 44.6 01/06/2012   PLT 170.0 01/06/2012   GLUCOSE 93 01/06/2012   CHOL 146 01/06/2012   TRIG 279.0* 01/06/2012   HDL 36.40* 01/06/2012   LDLDIRECT 76.8 01/06/2012   ALT 27 01/06/2012   ALT 27 01/06/2012   AST 28 01/06/2012   AST 28 01/06/2012   NA 142 01/06/2012   K 4.1 01/06/2012   CL 106 01/06/2012   CREATININE 1.1 01/06/2012   BUN 18 01/06/2012   CO2 27 01/06/2012   TSH 2.81 03/29/2011   PSA 1.18 04/19/2008   INR 1.1 RATIO* 02/07/2009   HGBA1C 5.8 10/02/2010         Assessment & Plan:

## 2012-01-08 ENCOUNTER — Encounter: Payer: Self-pay | Admitting: Gastroenterology

## 2012-01-08 ENCOUNTER — Encounter: Payer: Self-pay | Admitting: Internal Medicine

## 2012-01-08 ENCOUNTER — Other Ambulatory Visit: Payer: Self-pay | Admitting: Cardiology

## 2012-01-08 ENCOUNTER — Other Ambulatory Visit: Payer: Self-pay | Admitting: Internal Medicine

## 2012-01-08 DIAGNOSIS — Z0000 Encounter for general adult medical examination without abnormal findings: Secondary | ICD-10-CM | POA: Insufficient documentation

## 2012-01-08 NOTE — Assessment & Plan Note (Signed)
Stable with no symptoms or limitations in activities. He continues on pradaxa.

## 2012-01-08 NOTE — Assessment & Plan Note (Signed)
Lab Results  Component Value Date   HGBA1C 5.8 10/02/2010  Serum glucose 93 - normal  Plan - with no evidence of elevated glucose level will continue with life style management only - low or no sugar and low carbohydrate diet.

## 2012-01-08 NOTE — Telephone Encounter (Signed)
Done

## 2012-01-08 NOTE — Assessment & Plan Note (Signed)
BP Readings from Last 3 Encounters:  01/06/12 120/78  08/16/11 116/76  06/18/11 107/66   Good control. Continue present medications.

## 2012-01-08 NOTE — Assessment & Plan Note (Signed)
No complaints of uncontrolled symptoms. Currently taking no medications

## 2012-01-08 NOTE — Assessment & Plan Note (Signed)
Long standing history of bleeding hemorrhoids. This continues to be a problem and is exacerbated by Pradaxa.  Plan - routine hemorrhoidal treatment           Anusol HC 25 mg suppositories for use when he has a flare.

## 2012-01-08 NOTE — Assessment & Plan Note (Signed)
Lab reveals LDL is at goal of less than 80. Triglycerides are elevated at 279 -  Plan - continue lipitor           Careful low fat diet - to bring down triglycerides - no medication change needed.

## 2012-01-08 NOTE — Assessment & Plan Note (Signed)
Stable with no cardiac complaints. Had abnormal nuclear stress May '12, abnormal 2 D echo May '12. He was offered cath in May '12 by Dr. Riley Kill but declined. Last office visit with Dr. Riley Kill Sept 7th, '12 he was stable and was to continue on risk reduction and prevention.

## 2012-01-08 NOTE — Assessment & Plan Note (Signed)
Interval medical history is stable. Physical exam is unremarkable. Lab results reveal good control of cholesterol levels, blood sugar and the remainder are normal. He is due for colonoscopy - known hemorrhoids, issue of fecal soilage - will refer for GI consult. Immunizations - up to date except for shingles vaccine.   In summary - a nice man who remains active, who appears to be medically stable. He will continue his active life-style. He will return as needed or in 1 year.

## 2012-01-10 ENCOUNTER — Encounter: Payer: Self-pay | Admitting: Internal Medicine

## 2012-01-17 ENCOUNTER — Telehealth: Payer: Self-pay | Admitting: Internal Medicine

## 2012-01-17 MED ORDER — DUTASTERIDE-TAMSULOSIN HCL 0.5-0.4 MG PO CAPS: 1.0000 | ORAL_CAPSULE | Freq: Every day | ORAL | Status: DC

## 2012-01-17 NOTE — Telephone Encounter (Signed)
Per pt sample worked--request Jalyn prescription call in--[facility] Sharper

## 2012-01-17 NOTE — Telephone Encounter (Signed)
Done

## 2012-01-19 ENCOUNTER — Encounter: Payer: Self-pay | Admitting: Internal Medicine

## 2012-01-23 ENCOUNTER — Encounter: Payer: Self-pay | Admitting: *Deleted

## 2012-01-24 ENCOUNTER — Other Ambulatory Visit (INDEPENDENT_AMBULATORY_CARE_PROVIDER_SITE_OTHER): Payer: Medicare Other

## 2012-01-24 ENCOUNTER — Encounter: Payer: Self-pay | Admitting: Gastroenterology

## 2012-01-24 ENCOUNTER — Ambulatory Visit (INDEPENDENT_AMBULATORY_CARE_PROVIDER_SITE_OTHER): Payer: Medicare Other | Admitting: Gastroenterology

## 2012-01-24 VITALS — BP 110/70 | HR 60 | Ht 70.0 in | Wt 221.6 lb

## 2012-01-24 DIAGNOSIS — K625 Hemorrhage of anus and rectum: Secondary | ICD-10-CM

## 2012-01-24 DIAGNOSIS — Z79899 Other long term (current) drug therapy: Secondary | ICD-10-CM

## 2012-01-24 DIAGNOSIS — Z7901 Long term (current) use of anticoagulants: Secondary | ICD-10-CM

## 2012-01-24 DIAGNOSIS — I4891 Unspecified atrial fibrillation: Secondary | ICD-10-CM

## 2012-01-24 LAB — FERRITIN: Ferritin: 47.3 ng/mL (ref 22.0–322.0)

## 2012-01-24 LAB — IBC PANEL
Iron: 109 ug/dL (ref 42–165)
Saturation Ratios: 26.5 % (ref 20.0–50.0)
Transferrin: 294 mg/dL (ref 212.0–360.0)

## 2012-01-24 MED ORDER — PEG-KCL-NACL-NASULF-NA ASC-C 100 G PO SOLR: 1.0000 | Freq: Once | ORAL | Status: DC

## 2012-01-24 MED ORDER — HYDROCORTISONE ACETATE 25 MG RE SUPP
25.0000 mg | Freq: Every day | RECTAL | Status: AC
Start: 2012-01-24 — End: 2012-02-03

## 2012-01-24 NOTE — Patient Instructions (Signed)
Your procedure has been scheduled for 02/10/2012, please follow the seperate instructions.  Please go to the basement today for your labs.  Your prescription(s) have been sent to you pharmacy.  Increase you Metamucil to twice a day. Use Balenol OTC to clean rectum twice a day.  We will contact you about holding your Pradaxa once we hear from Dr Riley Kill.

## 2012-01-24 NOTE — Progress Notes (Signed)
History of Present Illness:  This is a 78 year old Caucasian male with multiple medical and cardiovascular problems. Had intermittent rectal bleeding for several years, almost constantly for the last several months. He does describe rectal discomfort and itching him a occasional incontinency but no dominant pain, upper GI, or hepatobiliary complaints. Colonoscopy in 2005 was unremarkable. The patient has chronic atrial fibrillation with previous ordinary artery bypass surgery, is followed by Dr. Riley Kill and Dr. [name]. Apparently he was placed on  Pradaxa several months ago. He denies current cardiovascular or pulmonary complaints. He does have an uncle that had colon cancer.  I have reviewed this patient's present history, medical and surgical past history, allergies and medications.     ROS: The remainder of the 10 point ROS is negative.. does complain of chronic myalgias, excessive urination, peripheral edema, chronic insomnia.  No Known Allergies Outpatient Prescriptions Prior to Visit  Medication Sig Dispense Refill  . amLODipine (NORVASC) 5 MG tablet Take 1 tablet (5 mg total) by mouth daily.  30 tablet  11  . aspirin 81 MG tablet Take 81 mg by mouth daily.        Marland Kitchen atorvastatin (LIPITOR) 10 MG tablet Take 1 tablet (10 mg total) by mouth daily.  90 tablet  3  . benazepril (LOTENSIN) 40 MG tablet TAKE 1 TABLET DAILY  30 tablet  11  . Calcium Carbonate-Vitamin D (CALCIUM-VITAMIN D) 600-200 MG-UNIT CAPS Take 1 tablet by mouth 2 (two) times daily.        . Cholecalciferol (VITAMIN D) 1000 UNITS capsule Take 1,000 Units by mouth daily.        . Cyanocobalamin (VITAMIN B-12) 1000 MCG SUBL Place under the tongue.        . dabigatran (PRADAXA) 150 MG CAPS Take 1 capsule (150 mg total) by mouth every 12 (twelve) hours.  180 capsule  3  . Dutasteride-Tamsulosin HCl 0.5-0.4 MG CAPS Take 1 capsule by mouth daily.  30 capsule  3  . furosemide (LASIX) 40 MG tablet 40 mg daily. Taking 1/2 Tab        . Multiple Vitamin (MULTIVITAMIN) capsule Take 1 capsule by mouth daily.        . nitroGLYCERIN (NITROSTAT) 0.4 MG SL tablet Place 1 tablet (0.4 mg total) under the tongue every 5 (five) minutes as needed for chest pain.  90 tablet  12  . Omega-3 Fatty Acids (FISH OIL) 1000 MG CAPS 1 capsule daily.       . TOPROL XL 25 MG 24 hr tablet TAKE 1/2 TABLET DAILY  30 each  6   Past Medical History  Diagnosis Date  . Knee pain   . CAD (coronary artery disease)     s/p CABG 1993 (L-LAD, S-RI, S-D1);   echo 1/10: EF 55%;    myoview 12/09: inf MI, no ischemia  . Hyperlipidemia   . HTN (hypertension)   . Folliculitis   . Dyspnea   . Hematuria   . Hematochezia   . Obesity   . Osteoarthritis   . Irritable bladder   . Sleep disorder   . Family history of malignant neoplasm of gastrointestinal tract    Past Surgical History  Procedure Date  . Coronary artery bypass graft     graft '92: LIMA-LAD, SVG - D2,R1, D1  . Cholecystectomy     laparoscopic '92  . Mole removal 2001 and 2008  . Pilonidal cyst excision    History   Social History  . Marital Status: Widowed  Spouse Name: N/A    Number of Children: 1  . Years of Education: N/A   Occupational History  . Retired    Social History Main Topics  . Smoking status: Former Games developer  . Smokeless tobacco: Never Used   Comment: quit 1965  . Alcohol Use: No  . Drug Use: No  . Sexually Active: None   Other Topics Concern  . 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 Married '57- widowed 2023/05/07. 1 son -'54- died '05-14-23 MVA. Retired: keeps himself busy. ACP - not reviewed (Jan '13)   Family History  Problem Relation Age of Onset  . Coronary artery disease Father     and brother  . Heart attack Father     and brother-fatal  . Stroke Father   . Breast cancer Mother   . Colon cancer Maternal Uncle        Physical Exam: General well developed well nourished patient in no acute distress, appearing his stated age Eyes  PERRLA, no icterus, fundoscopic exam per opthamologist Skin no lesions noted Neck supple, no adenopathy, no thyroid enlargement, no tenderness Chest clear to percussion and auscultation Heart no significant murmurs, gallops or rubs noted.. Irregular/irregular rhythm noted rate approximately 60. Blood pressure 110/70. Abdomen no hepatosplenomegaly masses or tenderness, BS normal.  Rectal inspection normal no fissures, or fistulae noted.  No masses or tenderness on digital exam. Stool guaiac negative. Extremities no acute joint lesions, edema, phlebitis or evidence of cellulitis. Neurologic patient oriented x 3, cranial nerves intact, no focal neurologic deficits noted. Psychological mental status normal and normal affect.  Assessment and plan: Probable hemorrhoidal bleeding in a patient who is on Pradaxa because of his atrial fibrillation. I've scheduled him for followup colonoscopy to exclude colonic polyposis, and we will hold his Pradaxa 5 days before this procedure unless otherwise advised by Drs. Stuckey or Norins. I have prescribed Anusol-HC suppositories at bedtime with Balneol wipes twice a day to his perianal area. Otherwise he has been advised to continue medications as listed and reviewed. I have advised a high-fiber diet and will increase his Metamucil to twice a day dosage with liberal by mouth fluids. Patient is on Align robotic therapy chronically. He also takes Colace 100 mg twice a day. Encounter Diagnosis  Name Primary?  . Rectal bleeding Yes

## 2012-01-27 ENCOUNTER — Telehealth: Payer: Self-pay | Admitting: *Deleted

## 2012-01-27 ENCOUNTER — Encounter: Payer: Self-pay | Admitting: *Deleted

## 2012-01-27 ENCOUNTER — Encounter: Payer: Self-pay | Admitting: Gastroenterology

## 2012-01-27 LAB — FOLATE: Folate: >24.8 ng/mL (ref >5.9)

## 2012-01-27 NOTE — Telephone Encounter (Signed)
  01/27/2012    RE: [name] DOB: [birth year] MRN: [id number]   Dear [name] ,    We have scheduled the above patient for an endoscopic procedure. Our records show that he is on anticoagulation therapy.   Please advise as to how long the patient may come off his therapy of Pradaxa prior to the procedure, which is scheduled for 02/10/2012.     Sincerely,  Harlow Mares , CMA (AAMA)

## 2012-01-28 NOTE — Telephone Encounter (Signed)
Dr Riley Kill is in the office on 01/29/12 and will review message at that time.

## 2012-01-31 NOTE — Telephone Encounter (Signed)
Patient needs GI procedure.  Reasonable to hold pradaxa.  He should hold for about 5 days prior to the procedure.  This would be ok.  TS

## 2012-02-03 NOTE — Telephone Encounter (Signed)
Called to advise pt that he should take his last dose of pradaxa 02/05/2012 he should not take any on 02/06/2012 until after his procedure. He verbalized understanding

## 2012-02-10 ENCOUNTER — Encounter: Payer: Self-pay | Admitting: Gastroenterology

## 2012-02-10 ENCOUNTER — Ambulatory Visit (AMBULATORY_SURGERY_CENTER): Payer: Medicare Other | Admitting: Gastroenterology

## 2012-02-10 VITALS — BP 133/82 | HR 74 | Temp 97.6°F | Resp 20

## 2012-02-10 DIAGNOSIS — Z1211 Encounter for screening for malignant neoplasm of colon: Secondary | ICD-10-CM

## 2012-02-10 DIAGNOSIS — K921 Melena: Secondary | ICD-10-CM

## 2012-02-10 DIAGNOSIS — K648 Other hemorrhoids: Secondary | ICD-10-CM

## 2012-02-10 DIAGNOSIS — K625 Hemorrhage of anus and rectum: Secondary | ICD-10-CM

## 2012-02-10 MED ORDER — SODIUM CHLORIDE 0.9 % IV SOLN: 500.0000 mL | INTRAVENOUS | Status: DC

## 2012-02-10 NOTE — Progress Notes (Signed)
Abdominal pressure applied to reach cecum 

## 2012-02-10 NOTE — Patient Instructions (Signed)
Discharge instructions given with verbal understanding. Handouts on hemorrhoids and a high fiber diet given. Resume previous medications. YOU HAD AN ENDOSCOPIC PROCEDURE TODAY AT THE [facility]: Refer to the procedure report that was given to you for any specific questions about what was found during the examination.  If the procedure report does not answer your questions, please call your gastroenterologist to clarify.  If you requested that your care partner not be given the details of your procedure findings, then the procedure report has been included in a sealed envelope for you to review at your convenience later.  YOU SHOULD EXPECT: Some feelings of bloating in the abdomen. Passage of more gas than usual.  Walking can help get rid of the air that was put into your GI tract during the procedure and reduce the bloating. If you had a lower endoscopy (such as a colonoscopy or flexible sigmoidoscopy) you may notice spotting of blood in your stool or on the toilet paper. If you underwent a bowel prep for your procedure, then you may not have a normal bowel movement for a few days.  DIET: Your first meal following the procedure should be a light meal and then it is ok to progress to your normal diet.  A half-sandwich or bowl of soup is an example of a good first meal.  Heavy or fried foods are harder to digest and may make you feel nauseous or bloated.  Likewise meals heavy in dairy and vegetables can cause extra gas to form and this can also increase the bloating.  Drink plenty of fluids but you should avoid alcoholic beverages for 24 hours.  ACTIVITY: Your care partner should take you home directly after the procedure.  You should plan to take it easy, moving slowly for the rest of the day.  You can resume normal activity the day after the procedure however you should NOT DRIVE or use heavy machinery for 24 hours (because of the sedation medicines used during the test).    SYMPTOMS TO  REPORT IMMEDIATELY: A gastroenterologist can be reached at any hour.  During normal business hours, 8:30 AM to 5:00 PM Monday through Friday, call [phone].  After hours and on weekends, please call the GI answering service at [phone] who will take a message and have the physician on call contact you.   Following lower endoscopy (colonoscopy or flexible sigmoidoscopy):  Excessive amounts of blood in the stool  Significant tenderness or worsening of abdominal pains  Swelling of the abdomen that is new, acute  Fever of 100F or higher FOLLOW UP: If any biopsies were taken you will be contacted by phone or by letter within the next 1-3 weeks.  Call your gastroenterologist if you have not heard about the biopsies in 3 weeks.  Our staff will call the home number listed on your records the next business day following your procedure to check on you and address any questions or concerns that you may have at that time regarding the information given to you following your procedure. This is a courtesy call and so if there is no answer at the home number and we have not heard from you through the emergency physician on call, we will assume that you have returned to your regular daily activities without incident.  SIGNATURES/CONFIDENTIALITY: You and/or your care partner have signed paperwork which will be entered into your electronic medical record.  These signatures attest to the fact that that the information above on your   After Visit Summary has been reviewed and is understood.  Full responsibility of the confidentiality of this discharge information lies with you and/or your care-partner.  

## 2012-02-10 NOTE — Op Note (Signed)
[facility] [address] [place], [place]  [address]  COLONOSCOPY PROCEDURE REPORT  PATIENT:  [name], [name]  MR#:  [id number] BIRTHDATE:  1931/01/31, 80 yrs. old  GENDER:  male ENDOSCOPIST:  Vania Rea. Jarold Motto, MD, [facility] REF. BY: PROCEDURE DATE:  02/10/2012 PROCEDURE:  Diagnostic Colonoscopy ASA CLASS:  Class III INDICATIONS:  bright red bleeding MEDICATIONS:   propofol (Diprivan) 150 mg IV  DESCRIPTION OF PROCEDURE:   After the risks and benefits and of the procedure were explained, informed consent was obtained. Digital rectal exam was performed and revealed no abnormalities. The LB 180AL [id number] endoscope was introduced through the anus and advanced to the cecum, which was identified by both the appendix and ileocecal valve.  The quality of the prep was excellent, using MoviPrep.  The instrument was then slowly withdrawn as the colon was fully examined. <<PROCEDUREIMAGES>>  FINDINGS:  No polyps or cancers were seen.  This was otherwise a normal examination of the colon.  Internal Hemorrhoids were found. Retroflexed views in the rectum revealed no abnormalities.    The scope was then withdrawn from the patient and the procedure completed.  COMPLICATIONS:  None ENDOSCOPIC IMPRESSION: 1) No polyps or cancers 2) Otherwise normal examination RECOMMENDATIONS: 1) Continue current medications local anal care  REPEAT EXAM:  No  ______________________________ Vania Rea. Jarold Motto, MD, [facility] Graham  CC:  Jacques Navy, MDThomas Cheri Kearns, MD  n. Rosalie DoctorMarland Kitchen   Vania Rea. Lirio Bach at 02/10/2012 01:56 PM  Jori Moll, [phone]

## 2012-02-10 NOTE — Progress Notes (Signed)
Patient did not experience any of the following events: a burn prior to discharge; a fall within the facility; wrong site/side/patient/procedure/implant event; or a hospital transfer or hospital admission upon discharge from the facility. (G8907) Patient did not have preoperative order for IV antibiotic SSI prophylaxis. (G8918)  

## 2012-02-11 ENCOUNTER — Telehealth: Payer: Self-pay

## 2012-02-11 NOTE — Telephone Encounter (Signed)
  Follow up Call-  Call back number 02/10/2012  Post procedure Call Back phone  # [phone] hm  Permission to leave phone message Yes     Patient questions:  Do you have a fever, pain , or abdominal swelling? no Pain Score  0 *  Have you tolerated food without any problems? yes  Have you been able to return to your normal activities? yes  Do you have any questions about your discharge instructions: Diet   no Medications  no Follow up visit  no  Do you have questions or concerns about your Care? no  Actions: * If pain score is 4 or above: No action needed, pain <4.

## 2012-02-22 ENCOUNTER — Other Ambulatory Visit: Payer: Self-pay | Admitting: Internal Medicine

## 2012-02-24 NOTE — Telephone Encounter (Signed)
Done

## 2012-03-17 ENCOUNTER — Encounter: Payer: Self-pay | Admitting: *Deleted

## 2012-03-17 ENCOUNTER — Emergency Department
Admission: EM | Admit: 2012-03-17 | Discharge: 2012-03-17 | Disposition: A | Discharge status: Home / Self Care | Payer: Medicare Other | Source: Home / Self Care | Attending: Family Medicine | Admitting: Family Medicine

## 2012-03-17 DIAGNOSIS — Z23 Encounter for immunization: Secondary | ICD-10-CM

## 2012-03-17 DIAGNOSIS — L02519 Cutaneous abscess of unspecified hand: Secondary | ICD-10-CM

## 2012-03-17 DIAGNOSIS — S61409A Unspecified open wound of unspecified hand, initial encounter: Secondary | ICD-10-CM

## 2012-03-17 DIAGNOSIS — L03119 Cellulitis of unspecified part of limb: Secondary | ICD-10-CM

## 2012-03-17 DIAGNOSIS — S61439A Puncture wound without foreign body of unspecified hand, initial encounter: Secondary | ICD-10-CM

## 2012-03-17 MED ORDER — TETANUS-DIPHTH-ACELL PERTUSSIS 5-2.5-18.5 LF-MCG/0.5 IM SUSP
0.5000 mL | Freq: Once | INTRAMUSCULAR | Status: AC
  Administered 2012-03-17: 0.5 mL via INTRAMUSCULAR

## 2012-03-17 MED ORDER — CEPHALEXIN 500 MG PO CAPS: 500.0000 mg | ORAL_CAPSULE | Freq: Four times a day (QID) | ORAL | Status: AC

## 2012-03-17 NOTE — ED Provider Notes (Signed)
History     CSN: [id number]  Arrival date & time 03/17/12  7829   First MD Initiated Contact with Patient 03/17/12 1015      Chief Complaint  Patient presents with  . Wound Infection    left hand     HPI Comments: Patient states that 3 days ago an opened pocket knife fell on the dorsum of his left hand, penetrating about 1/4th inch.  He has had gradual development of pain, swelling, and redness of the hand.  The swelling increased yesterday after he played golf.  Initially he had minimal pain and no loss of function.  He does not recall last tetanus shot.  Patient is a 78 y.o. male presenting with hand pain. The history is provided by the patient.  Hand Pain This is a new problem. Episode onset: 3 days ago. The problem occurs constantly. The problem has been gradually worsening. Exacerbated by: movement of hand. The symptoms are relieved by nothing. He has tried nothing for the symptoms.    Past Medical History  Diagnosis Date  . Knee pain   . CAD (coronary artery disease)     s/p CABG 1993 (L-LAD, S-RI, S-D1);   echo 1/10: EF 55%;    myoview 12/09: inf MI, no ischemia  . Hyperlipidemia   . HTN (hypertension)   . Folliculitis   . Dyspnea   . Hematuria   . Hematochezia   . Obesity   . Osteoarthritis   . Irritable bladder   . Sleep disorder   . Family history of malignant neoplasm of gastrointestinal tract   . Myocardial infarction     Past Surgical History  Procedure Date  . Coronary artery bypass graft     graft '92: LIMA-LAD, SVG - D2,R1, D1  . Cholecystectomy     laparoscopic '92  . Mole removal 2001 and 2008  . Pilonidal cyst excision     Family History  Problem Relation Age of Onset  . Coronary artery disease Father     and brother  . Heart attack Father     and brother-fatal  . Stroke Father   . Breast cancer Mother   . Colon cancer Maternal Uncle   . Esophageal cancer Neg Hx   . Stomach cancer Neg Hx   . Rectal cancer Neg Hx     History  Substance  Use Topics  . Smoking status: Former Games developer  . Smokeless tobacco: Never Used   Comment: quit 1965  . Alcohol Use: No      Review of Systems  All other systems reviewed and are negative.    Allergies  Review of patient's allergies indicates no known allergies.  Home Medications   Current Outpatient Rx  Name Route Sig Dispense Refill  . AMLODIPINE BESYLATE 5 MG PO TABS  TAKE 1 TABLET DAILY 30 tablet 11  . ASPIRIN 81 MG PO TABS Oral Take 81 mg by mouth daily.      . ATORVASTATIN CALCIUM 10 MG PO TABS Oral Take 1 tablet (10 mg total) by mouth daily. 90 tablet 3  . BENAZEPRIL HCL 40 MG PO TABS  TAKE 1 TABLET DAILY 30 tablet 11  . CALCIUM-VITAMIN D 600-200 MG-UNIT PO CAPS Oral Take 1 tablet by mouth 2 (two) times daily.      . CEPHALEXIN 500 MG PO CAPS Oral Take 1 capsule (500 mg total) by mouth 4 (four) times daily. 40 capsule 0  . VITAMIN D 1000 UNITS PO CAPS Oral Take 1,000  Units by mouth daily.      Marland Kitchen VITAMIN B-12 1000 MCG SL SUBL Sublingual Place under the tongue.      Marland Kitchen DABIGATRAN ETEXILATE MESYLATE 150 MG PO CAPS Oral Take 1 capsule (150 mg total) by mouth every 12 (twelve) hours. 180 capsule 3  . DUTASTERIDE-TAMSULOSIN HCL 0.5-0.4 MG PO CAPS Oral Take 1 capsule by mouth daily. 30 capsule 3  . FUROSEMIDE 40 MG PO TABS  40 mg daily. Taking 1/2 Tab    . MULTIVITAMINS PO CAPS Oral Take 1 capsule by mouth daily.      Marland Kitchen NITROGLYCERIN 0.4 MG SL SUBL Sublingual Place 1 tablet (0.4 mg total) under the tongue every 5 (five) minutes as needed for chest pain. 90 tablet 12  . FISH OIL 1000 MG PO CAPS  1 capsule daily.     . TOPROL XL 25 MG PO TB24  TAKE 1/2 TABLET DAILY 30 each 6    BP 148/87  Pulse 71  Temp(Src) 98.4 F (36.9 C) (Oral)  Resp 12  Ht 5\' 11"  (1.803 m)  Wt 222 lb (100.699 kg)  BMI 30.96 kg/m2  SpO2 96%  Physical Exam  Nursing note and vitals reviewed. Constitutional: He appears well-developed and well-nourished. No distress.  Musculoskeletal:       Left hand: He  exhibits decreased range of motion, tenderness, laceration and swelling. He exhibits no bony tenderness, normal two-point discrimination, normal capillary refill and no deformity. normal sensation noted. Normal strength noted.       Hands:      On the dorsum of the left hand is a small 5mm long puncture wound draining a small amount of clear yellow fluid.  Surrounding area is tender, slightly warm, swollen, and erythematous.  He has difficulty making a fist.  Distal Neurovascular function is intact.     ED Course  Procedures  none   Labs Reviewed  WOUND CULTURE pending      1. Puncture wound, hand   2. Cellulitis of hand       MDM  Tdap given Wound culture obtained.  Begin Keflex. Bandage and Bacitracin applied. Change bandage daily and apply Bacitracin ointment.  Begin warm soaks 3 or 4 times daily.  Elevate hand. If symptoms become significantly worse during the night proceed to the local emergency room.  Followup with Family Doctor if not improved 3 days.        Lattie Haw, MD 03/19/12 1331

## 2012-03-17 NOTE — ED Notes (Signed)
Patient punctured his left hand with a knife 3 days ago. Puncture wound is healed but his left hand is edematous and red. Last TDaP unknown. Given to patient today.

## 2012-03-17 NOTE — Discharge Instructions (Signed)
Change bandage daily and apply Bacitracin ointment.  Begin warm soaks 3 or 4 times daily.  Elevate hand. If symptoms become significantly worse during the night proceed to the local emergency room.    Cellulitis Cellulitis is an infection of the skin and the tissue beneath it. The area is typically red and tender. It is caused by germs (bacteria) (usually staph or strep) that enter the body through cuts or sores. Cellulitis most commonly occurs in the arms or lower legs.  HOME CARE INSTRUCTIONS   If you are given a prescription for medications which kill germs (antibiotics), take as directed until finished.   If the infection is on the arm or leg, keep the limb elevated as able.   Use a warm cloth several times per day to relieve pain and encourage healing.   See your caregiver for recheck of the infected site as directed if problems arise.   Only take over-the-counter or prescription medicines for pain, discomfort, or fever as directed by your caregiver.  SEEK MEDICAL CARE IF:   The area of redness (inflammation) is spreading, there are red streaks coming from the infected site, or if a part of the infection begins to turn dark in color.   The joint or bone underneath the infected skin becomes painful after the skin has healed.   The infection returns in the same or another area after it seems to have gone away.   A boil or bump swells up. This may be an abscess.   New, unexplained problems such as pain or fever develop.  SEEK IMMEDIATE MEDICAL CARE IF:   You have a fever.   You or your child feels drowsy or lethargic.   There is vomiting, diarrhea, or lasting discomfort or feeling ill (malaise) with muscle aches and pains.  MAKE SURE YOU:   Understand these instructions.   Will watch your condition.   Will get help right away if you are not doing well or get worse.  Document Released: 09/04/2005 Document Revised: 11/14/2011 Document Reviewed: 07/13/2008 [facility] Patient  Information 2012 [place], [place].

## 2012-03-20 ENCOUNTER — Other Ambulatory Visit: Payer: Self-pay | Admitting: Internal Medicine

## 2012-03-20 LAB — WOUND CULTURE
Gram Stain: NONE SEEN
Organism ID, Bacteria: NO GROWTH

## 2012-03-20 NOTE — Telephone Encounter (Signed)
30-DAY SUPPLY ONLY--LAST OV 11.12.2010/SLS

## 2012-03-26 ENCOUNTER — Emergency Department
Admission: EM | Admit: 2012-03-26 | Discharge: 2012-03-26 | Disposition: A | Discharge status: Home / Self Care | Payer: Medicare Other | Source: Home / Self Care | Attending: Emergency Medicine | Admitting: Emergency Medicine

## 2012-03-26 ENCOUNTER — Encounter: Payer: Self-pay | Admitting: *Deleted

## 2012-03-26 DIAGNOSIS — R197 Diarrhea, unspecified: Secondary | ICD-10-CM

## 2012-03-26 MED ORDER — METRONIDAZOLE 500 MG PO TABS: 500.0000 mg | ORAL_TABLET | Freq: Three times a day (TID) | ORAL | Status: AC

## 2012-03-26 NOTE — ED Provider Notes (Signed)
History     CSN: [id number]  Arrival date & time 03/26/12  1511   First MD Initiated Contact with Patient 03/26/12 1533      Chief Complaint  Patient presents with  . Diarrhea    (Consider location/radiation/quality/duration/timing/severity/associated sxs/prior treatment) HPI This patient was seen here in clinic about 8 days ago and was given Keflex that he has been on for a hand infection.  He states the hand infection has been clearing up nicely but then he developed diarrhea 2 days ago.  He states that it is very bad and that he is having to go many times per day and is having difficulty sleeping at night because he is having so many bowel movements which he describes as watery but nonbloody.  He states he did have vomiting yesterday but today is not having any nausea or vomiting.  No fever or chills.  Other than the diarrhea, the patient states that he feels completely normal, no fatigue, chest pain, shortness of breath area he does have mild abdominal cramping that is epigastric but he did feel sore on both his right and left flank 2 days ago as well.  No other sick contacts and he lives at home by himself.  Past Medical History  Diagnosis Date  . Knee pain   . CAD (coronary artery disease)     s/p CABG 1993 (L-LAD, S-RI, S-D1);   echo 1/10: EF 55%;    myoview 12/09: inf MI, no ischemia  . Hyperlipidemia   . HTN (hypertension)   . Folliculitis   . Dyspnea   . Hematuria   . Hematochezia   . Obesity   . Osteoarthritis   . Irritable bladder   . Sleep disorder   . Family history of malignant neoplasm of gastrointestinal tract   . Myocardial infarction     Past Surgical History  Procedure Date  . Coronary artery bypass graft     graft '92: LIMA-LAD, SVG - D2,R1, D1  . Cholecystectomy     laparoscopic '92  . Mole removal 2001 and 2008  . Pilonidal cyst excision     Family History  Problem Relation Age of Onset  . Coronary artery disease Father     and brother  .  Heart attack Father     and brother-fatal  . Stroke Father   . Breast cancer Mother   . Colon cancer Maternal Uncle   . Esophageal cancer Neg Hx   . Stomach cancer Neg Hx   . Rectal cancer Neg Hx     History  Substance Use Topics  . Smoking status: Former Games developer  . Smokeless tobacco: Never Used   Comment: quit 1965  . Alcohol Use: No      Review of Systems  All other systems reviewed and are negative.    Allergies  Review of patient's allergies indicates no known allergies.  Home Medications   Current Outpatient Rx  Name Route Sig Dispense Refill  . AMLODIPINE BESYLATE 5 MG PO TABS  TAKE 1 TABLET DAILY 30 tablet 11  . ASPIRIN 81 MG PO TABS Oral Take 81 mg by mouth daily.      . ATORVASTATIN CALCIUM 10 MG PO TABS Oral Take 1 tablet (10 mg total) by mouth daily. 90 tablet 3  . BENAZEPRIL HCL 40 MG PO TABS  TAKE 1 TABLET DAILY 30 tablet 11  . CALCIUM-VITAMIN D 600-200 MG-UNIT PO CAPS Oral Take 1 tablet by mouth 2 (two) times daily.      Marland Kitchen  CEPHALEXIN 500 MG PO CAPS Oral Take 1 capsule (500 mg total) by mouth 4 (four) times daily. 40 capsule 0  . VITAMIN D 1000 UNITS PO CAPS Oral Take 1,000 Units by mouth daily.      Marland Kitchen VITAMIN B-12 1000 MCG SL SUBL Sublingual Place under the tongue.      Marland Kitchen DABIGATRAN ETEXILATE MESYLATE 150 MG PO CAPS Oral Take 1 capsule (150 mg total) by mouth every 12 (twelve) hours. 180 capsule 3  . DUTASTERIDE-TAMSULOSIN HCL 0.5-0.4 MG PO CAPS Oral Take 1 capsule by mouth daily. 30 capsule 3  . FUROSEMIDE 40 MG PO TABS  TAKE 1 TABLET DAILY 30 tablet 0    *PATIENT OVERDUE FOR OFFICE VISIT-NO FURTHER MONTH .Marland Kitchen.  . METRONIDAZOLE 500 MG PO TABS Oral Take 1 tablet (500 mg total) by mouth 3 (three) times daily. 27 tablet 0  . MULTIVITAMINS PO CAPS Oral Take 1 capsule by mouth daily.      Marland Kitchen NITROGLYCERIN 0.4 MG SL SUBL Sublingual Place 1 tablet (0.4 mg total) under the tongue every 5 (five) minutes as needed for chest pain. 90 tablet 12  . FISH OIL 1000 MG PO  CAPS  1 capsule daily.     . TOPROL XL 25 MG PO TB24  TAKE 1/2 TABLET DAILY 30 each 6    BP 104/68  Pulse 80  Temp(Src) 98.2 F (36.8 C) (Oral)  Resp 14  Wt 214 lb (97.07 kg)  SpO2 97%  Physical Exam  Nursing note and vitals reviewed. Constitutional: He is oriented to person, place, and time. He appears well-developed and well-nourished.  Non-toxic appearance. He does not have a sickly appearance. He does not appear ill. No distress.  HENT:  Head: Normocephalic and atraumatic.  Eyes: No scleral icterus.  Neck: Neck supple.  Cardiovascular: Normal rate, regular rhythm and normal heart sounds.   Pulmonary/Chest: Effort normal and breath sounds normal. No respiratory distress.  Abdominal: Soft. Normal appearance. There is tenderness in the epigastric area. There is no rigidity, no rebound, no guarding, no CVA tenderness, no tenderness at McBurney's point and negative Murphy's sign.  Neurological: He is alert and oriented to person, place, and time.  Skin: Skin is warm and dry. No rash noted.  Psychiatric: He has a normal mood and affect. His speech is normal.    ED Course  Procedures (including critical care time)  Labs Reviewed - No data to display No results found.   1. Diarrhea       MDM   With his current symptoms and recent antibiotic therapy with Keflex I am mostly concerned with his C. difficile colitis.  I started him on metronidazole and we will also be doing stool studies. He does not appear dehydrated or distressed at this time.  I discussed with him that he needs to make sure he stays hydrated and taking in more fluids than he is putting out.  I discussed with him the possibility of doing IV rehydration but he states that he does not feel that bad otherwise so we will hold off on that for now.  However if his symptoms continue, then replacing 1-2 L of saline may be appropriate. He can continue Imodium or over-the-counter diarrhea medicines since they are helping a  little.  ER precautions were given for worsening pain which may indicate something such as appendicitis versus diverticulitis, etc.  Because of family history of malignant neoplasm of the GI tract, if this does not come back positive for C.  difficile he does not get better quickly, then referral to his PCP and a GI physician would be appropriate.  In addition followup labs will include CBC, BMP.  Probiotics may have a beneficial effect.   Marlaine Hind, MD 03/26/12 [phone]

## 2012-03-26 NOTE — ED Notes (Signed)
Patient c/o diarrhea x 2 days with vomiting 1 day ago. He has been on Keflex since 03/17/2012. He stopped the antibiotic yesterday with 2 days left. He reports he has been eating when taking Keflex. He has lost 8lbs since he was seen 9 days ago.

## 2012-03-29 ENCOUNTER — Telehealth: Payer: Self-pay | Admitting: Family Medicine

## 2012-03-30 LAB — OVA AND PARASITE EXAMINATION: OP: NONE SEEN

## 2012-03-30 LAB — STOOL CULTURE

## 2012-03-31 ENCOUNTER — Encounter: Payer: Self-pay | Admitting: Internal Medicine

## 2012-03-31 ENCOUNTER — Ambulatory Visit (INDEPENDENT_AMBULATORY_CARE_PROVIDER_SITE_OTHER): Payer: Medicare Other | Admitting: Internal Medicine

## 2012-03-31 VITALS — BP 100/68 | HR 66 | Temp 97.7°F | Resp 16 | Wt 215.0 lb

## 2012-03-31 DIAGNOSIS — R197 Diarrhea, unspecified: Secondary | ICD-10-CM

## 2012-04-02 NOTE — Progress Notes (Signed)
  Subjective:    Patient ID: [name], male    DOB: 1931-02-18, 78 y.o.   MRN: [id number]  HPI Mr. Laymon presents for follow up of diarrhea. He had several days of copious diarrhea leading to evaluation at [facility] 03/26/12, Dr. Orson Aloe, note and labs reviewed. He had an unremarkable exam. Stool studies for infectious diarrhea were negative. C.Diff PCR was not done. He was started on flagyl empirically and hydration. He did continue to take immodium. He reports that he has not had any further abdominal pain, no blood or mucus in the stool and he has only had 3-4 stools 2/22 and 2 stools 2/23. He is feeling much better.  PMH, FamHx and SocHx reviewed for any changes and relevance.    Review of Systems System review is negative for any constitutional, cardiac, pulmonary, GI or neuro symptoms or complaints other than as described in the HPI.     Objective:   Physical Exam Filed Vitals:   03/31/12 1206  BP: 100/68  Pulse: 66  Temp: 97.7 F (36.5 C)  Resp: 16   Gen'l- heavyset white man in no distress HEENT C&S clear Cor - RRR Pulm - normal respirations Abdomen - BS+ x 4, soft, no guarding or rebound       Assessment & Plan:  Diarrheal illness - not a classic picture for c.diff with the absence of abdominal pain, fever, blood or mucus in the stool. Symptoms more suggestive of viral gastroenteritis. He reports that several acquaintances have had similar symptoms. He is now doing much better.  Plan - complete course of flagyl           May continue immodium prn                 Hydrate           For recurrent diarrhea /p finishing flagyl will need to have stool study for c.diff.

## 2012-04-24 ENCOUNTER — Encounter: Payer: Self-pay | Admitting: Cardiology

## 2012-04-24 ENCOUNTER — Ambulatory Visit (INDEPENDENT_AMBULATORY_CARE_PROVIDER_SITE_OTHER): Payer: Medicare Other | Admitting: Cardiology

## 2012-04-24 VITALS — BP 118/80 | HR 77 | Ht 70.0 in | Wt 219.8 lb

## 2012-04-24 DIAGNOSIS — I2510 Atherosclerotic heart disease of native coronary artery without angina pectoris: Secondary | ICD-10-CM

## 2012-04-24 DIAGNOSIS — E785 Hyperlipidemia, unspecified: Secondary | ICD-10-CM

## 2012-04-24 DIAGNOSIS — I4891 Unspecified atrial fibrillation: Secondary | ICD-10-CM

## 2012-04-24 NOTE — Progress Notes (Signed)
HPI:  The patient is doing well. He denies any chest pain. He goes out dancing on Friday and Saturday nights.  No chest pain.  Denies shortness of breath.    Current Outpatient Prescriptions  Medication Sig Dispense Refill  . amLODipine (NORVASC) 5 MG tablet TAKE 1 TABLET DAILY  30 tablet  11  . aspirin 81 MG tablet Take 81 mg by mouth daily.        Marland Kitchen atorvastatin (LIPITOR) 10 MG tablet Take 1 tablet (10 mg total) by mouth daily.  90 tablet  3  . benazepril (LOTENSIN) 40 MG tablet TAKE 1 TABLET DAILY  30 tablet  11  . Calcium Carbonate-Vitamin D (CALCIUM-VITAMIN D) 600-200 MG-UNIT CAPS Take 1 tablet by mouth 2 (two) times daily.        . cephALEXin (KEFLEX) 500 MG capsule Take 500 mg by mouth 4 (four) times daily.      . Cholecalciferol (VITAMIN D) 1000 UNITS capsule Take 1,000 Units by mouth daily.        . Cyanocobalamin (VITAMIN B-12) 1000 MCG SUBL Place under the tongue.        . dabigatran (PRADAXA) 150 MG CAPS Take 1 capsule (150 mg total) by mouth every 12 (twelve) hours.  180 capsule  3  . Dutasteride-Tamsulosin HCl 0.5-0.4 MG CAPS Take 1 capsule by mouth daily.  30 capsule  3  . furosemide (LASIX) 40 MG tablet TAKE 1 TABLET DAILY  30 tablet  0  . Multiple Vitamin (MULTIVITAMIN) capsule Take 1 capsule by mouth daily.        . nitroGLYCERIN (NITROSTAT) 0.4 MG SL tablet Place 1 tablet (0.4 mg total) under the tongue every 5 (five) minutes as needed for chest pain.  90 tablet  12  . Omega-3 Fatty Acids (FISH OIL) 1000 MG CAPS 1 capsule daily.       . TOPROL XL 25 MG 24 hr tablet TAKE 1/2 TABLET DAILY  30 each  6    No Known Allergies  Past Medical History  Diagnosis Date  . Knee pain   . CAD (coronary artery disease)     s/p CABG 1993 (L-LAD, S-RI, S-D1);   echo 1/10: EF 55%;    myoview 12/09: inf MI, no ischemia  . Hyperlipidemia   . HTN (hypertension)   . Folliculitis   . Dyspnea   . Hematuria   . Hematochezia   . Obesity   . Osteoarthritis   . Irritable bladder   .  Sleep disorder   . Family history of malignant neoplasm of gastrointestinal tract   . Myocardial infarction     Past Surgical History  Procedure Date  . Coronary artery bypass graft     graft '92: LIMA-LAD, SVG - D2,R1, D1  . Cholecystectomy     laparoscopic '92  . Mole removal 2001 and 2008  . Pilonidal cyst excision     Family History  Problem Relation Age of Onset  . Coronary artery disease Father     and brother  . Heart attack Father     and brother-fatal  . Stroke Father   . Breast cancer Mother   . Colon cancer Maternal Uncle   . Esophageal cancer Neg Hx   . Stomach cancer Neg Hx   . Rectal cancer Neg Hx     History   Social History  . Marital Status: Widowed    Spouse Name: N/A    Number of Children: 1  . Years of Education:  N/A   Occupational History  . Retired    Social History Main Topics  . Smoking status: Former Games developer  . Smokeless tobacco: Never Used   Comment: quit 1965  . Alcohol Use: No  . Drug Use: No  . Sexually Active: Not on file   Other Topics Concern  . 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 Married '57- widowed 2023/04/23. 1 son -'67- died 'Apr 30, 2023 MVA. Retired: keeps himself busy. ACP - not reviewed (Jan '13)    ROS: Please see the HPI.  All other systems reviewed and negative.  PHYSICAL EXAM:  BP 118/80  Pulse 77  Ht 5\' 10"  (1.778 m)  Wt 219 lb 12.8 oz (99.701 kg)  BMI 31.54 kg/m2  General: Well developed, well nourished, in no acute distress. Head:  Normocephalic and atraumatic. Neck: no JVD Lungs: Clear to auscultation and percussion. Heart: irregularly irregular.    No murmur, rubs or gallops.  Abdomen:  Normal bowel sounds; soft; non tender; no organomegaly Pulses: Pulses normal in all 4 extremities. Extremities: No clubbing or cyanosis. No edema. Neurologic: Alert and oriented x 3.  EKG:   Atrial fib controlled ventricular response.    ASSESSMENT AND PLAN:  30 minutes office time.

## 2012-04-24 NOTE — Patient Instructions (Signed)
Your physician wants you to follow-up in: 9 MONTHS with Dr Riley Kill.  You will receive a reminder letter in the mail two months in advance. If you don't receive a letter, please call our office to schedule the follow-up appointment.  Your physician recommends that you continue on your current medications as directed. Please refer to the Current Medication list given to you today.

## 2012-04-25 NOTE — Assessment & Plan Note (Signed)
Patient remains active and asymptomatic.  He has some reduction in overall LV function one year ago, but with no clinical correlate.  Last nuclear study one year ago suggests fixed inferior defect with some anterior ischemia.  He has not wanted cardiac catheterization, and he is clearly Class I-2 at most without identifiable angina.  We will continue to monitor him and I will see him back within the next nine months for continued follow up.  He would have an echo at that visit to follow up on mild LV dysfunction.

## 2012-04-25 NOTE — Assessment & Plan Note (Signed)
Controlled ventricular response, and he is on appropriate anticoagulation therapy.

## 2012-04-25 NOTE — Assessment & Plan Note (Signed)
Last LDL was near target.  Triglycerides remain high and we discussed.

## 2012-05-09 ENCOUNTER — Other Ambulatory Visit: Payer: Self-pay | Admitting: *Deleted

## 2012-05-09 DIAGNOSIS — I4891 Unspecified atrial fibrillation: Secondary | ICD-10-CM

## 2012-05-09 DIAGNOSIS — E7800 Pure hypercholesterolemia, unspecified: Secondary | ICD-10-CM

## 2012-05-09 MED ORDER — ATORVASTATIN CALCIUM 10 MG PO TABS: 10.0000 mg | ORAL_TABLET | Freq: Every day | ORAL | Status: DC

## 2012-05-09 MED ORDER — DABIGATRAN ETEXILATE MESYLATE 150 MG PO CAPS: 150.0000 mg | ORAL_CAPSULE | Freq: Two times a day (BID) | ORAL | Status: DC

## 2012-05-14 ENCOUNTER — Other Ambulatory Visit: Payer: Self-pay | Admitting: Cardiology

## 2012-05-14 DIAGNOSIS — I4891 Unspecified atrial fibrillation: Secondary | ICD-10-CM

## 2012-05-14 DIAGNOSIS — E7800 Pure hypercholesterolemia, unspecified: Secondary | ICD-10-CM

## 2012-05-14 MED ORDER — ATORVASTATIN CALCIUM 10 MG PO TABS: 10.0000 mg | ORAL_TABLET | Freq: Every day | ORAL | Status: DC

## 2012-05-14 MED ORDER — DABIGATRAN ETEXILATE MESYLATE 150 MG PO CAPS: 150.0000 mg | ORAL_CAPSULE | Freq: Two times a day (BID) | ORAL | Status: DC

## 2012-05-15 ENCOUNTER — Other Ambulatory Visit: Payer: Self-pay | Admitting: Cardiology

## 2012-05-15 DIAGNOSIS — E7800 Pure hypercholesterolemia, unspecified: Secondary | ICD-10-CM

## 2012-05-15 DIAGNOSIS — I4891 Unspecified atrial fibrillation: Secondary | ICD-10-CM

## 2012-05-15 MED ORDER — ATORVASTATIN CALCIUM 10 MG PO TABS: 10.0000 mg | ORAL_TABLET | Freq: Every day | ORAL | Status: DC

## 2012-05-15 MED ORDER — DABIGATRAN ETEXILATE MESYLATE 150 MG PO CAPS: 150.0000 mg | ORAL_CAPSULE | Freq: Two times a day (BID) | ORAL | Status: DC

## 2012-05-15 NOTE — Telephone Encounter (Signed)
Patient called concerning atorvastatin RX, med notes state RX sent into [facility] as of today.  Patient needs TEMPORARY SUPPLY sent to [facility], [place] - [address] as he is out of meds.

## 2012-05-18 ENCOUNTER — Other Ambulatory Visit: Payer: Self-pay | Admitting: Internal Medicine

## 2012-05-18 ENCOUNTER — Other Ambulatory Visit: Payer: Self-pay | Admitting: Cardiology

## 2012-05-18 NOTE — Telephone Encounter (Signed)
Rx sent to [facility]. Furosemide

## 2012-07-28 ENCOUNTER — Telehealth: Payer: Self-pay | Admitting: Cardiology

## 2012-07-28 NOTE — Telephone Encounter (Signed)
Please return call to patient [phone] regarding Pradaxa hold

## 2012-07-29 NOTE — Telephone Encounter (Signed)
I spoke with the pt and he was evaluated in the ER at [facility] yesterday for GI bleeding.  The pt was instructed by the ER physician to stop Pradaxa and ASA at this time. The pt would like to know what he needs to do at this point about follow-up and his medications.  I made the pt aware that I need to obtain his records for Dr Riley Kill to review. I also instructed the pt that Dr Riley Kill would like him to contact Dr Norval Gable office and schedule follow-up for bleeding.  The pt agreed with plan. The pt is scheduled to see Dr Jarold Motto on 08/13/12. I will obtain records.

## 2012-07-29 NOTE — Telephone Encounter (Signed)
Fu call Pt calling back again about pradaxa

## 2012-07-29 NOTE — Telephone Encounter (Signed)
F/u  Pt calling for f/u on yesterday's phone call

## 2012-07-31 ENCOUNTER — Telehealth: Payer: Self-pay | Admitting: Gastroenterology

## 2012-07-31 NOTE — Telephone Encounter (Signed)
Graciella Freer, RN 07/31/2012 11:22 AM Signed  Spoke with pt who stated his stools are normal now; he had one this am. He stated he had black stools for 3-4 days, went to [facility] ER, stopped his ASA and Pradaxa as instructed. He was asked to be admitted for an upper GI Bleed, but refused.  He will see Willette Cluster on 08/03/12 and restart his ASA and Pradaxa today with the understanding if he sees BRB or more black blood from his rectum or develops lightheadedness, weakness or becomes dizzy, he will go to the ER. Pt stated understanding. Notified Lauren with Dr Riley Kill. Graciella Freer, RN 07/31/2012 10:25 AM Signed  Pt seen by Dr Jarold Motto 01/24/12 for rectal bleeding followed by a COLON on 02/10/12 that was unremarkable except for internal hemorrhoids.  Spoke with Lauren at [facility] who stated pt's HGB was 12.4, but he was seen at [facility] ER and they asked him to stop the Pradaxa and ASA until seen by his GI doc.  Spoke with Dr Jarold Motto and he advised pt to restart the Pradaxa and ASA and see or NP on Monday.  Informed Lauren and by then she had read the notes faxed to her and pt apparently has an upper GI Bleed d/t the darkness of his stools. She states Dr Russella Dar was consulted and advised pt be admitted which pt refused. Lauren will fax me the notes

## 2012-07-31 NOTE — Telephone Encounter (Signed)
Spoke with pt who stated his stools are normal now; he had one this am. He stated he had black stools for 3-4 days, went to [facility] ER, stopped his ASA and Pradaxa as instructed. He was asked to be admitted for an upper GI Bleed, but refused. He will see Willette Cluster on 08/03/12 and restart his ASA and Pradaxa today with the understanding if he sees BRB or more black blood from his rectum or develops lightheadedness, weakness or becomes dizzy, he will go to the ER. Pt stated understanding. Notified Lauren with Dr Riley Kill.

## 2012-07-31 NOTE — Telephone Encounter (Signed)
Records obtained--Per Dr Riley Kill this pt needs to be evaluated by GI today due to bleeding. The pt is also off of ASA and Pradaxa and GI needs to evaluate him and clear him to restart these medications.  I have contacted [facility] and am awaiting a return call for an appointment.

## 2012-07-31 NOTE — Telephone Encounter (Signed)
Pt seen by Dr Jarold Motto 01/24/12 for rectal bleeding followed by a COLON on 02/10/12 that was unremarkable except for internal hemorrhoids. Spoke with Lauren at [facility] who stated pt's HGB was 12.4, but he was seen at [facility] ER and they asked him to stop the Pradaxa and ASA until seen by his GI doc. Spoke with Dr Jarold Motto and he advised pt to restart the Pradaxa and ASA and see or NP on Monday.  Informed Lauren and by then she had read the notes faxed to her and pt apparently has an upper GI Bleed d/t the darkness of his stools. She states Dr Russella Dar was consulted and advised pt be admitted which pt refused. Lauren will fax me the notes.

## 2012-08-03 ENCOUNTER — Ambulatory Visit (INDEPENDENT_AMBULATORY_CARE_PROVIDER_SITE_OTHER): Payer: Medicare Other | Admitting: Nurse Practitioner

## 2012-08-03 ENCOUNTER — Encounter: Payer: Self-pay | Admitting: Nurse Practitioner

## 2012-08-03 VITALS — BP 124/70 | HR 72 | Ht 70.0 in | Wt 216.8 lb

## 2012-08-03 DIAGNOSIS — K922 Gastrointestinal hemorrhage, unspecified: Secondary | ICD-10-CM

## 2012-08-03 NOTE — Progress Notes (Signed)
[name] [id number] 09-01-1931   HISTORY OR PRESENT ILLNESS :  Patient is a 78 -year-old male known to Dr. Jarold [name]. He saw Dr. Jarold [name] February 2013 evaluation of rectal bleeding and itching. Patient comes in today for evaluation of black stool. Last week he passed some black formed stools. No bismuth or iron products to account for black stool. No associated abdominal pain or nausea. Patient has not had any black stools since, bowel movements are completely normal now. Patient went to the [facility] emergency department for evaluation, stools were described as grossly Hemoccult-positive but I do not know if they were melenic or not based on the description. ED hemoglobin was 12.4, white count was normal. BUN was normal at 23, INR was normal, pro time was elevated at 17.6. Patient was discharged home from the emergency department. Patient takes 2-3  ibuprofen twice a week when he plays golf. He takes a baby aspirin every day.   Current Medications, Allergies, Past Medical History, Past Surgical History, Family History and Social History were reviewed in [employer] record.   PHYSICAL EXAMINATION : General:  Well developed  male in no acute distress Head: Normocephalic and atraumatic Eyes:  sclerae anicteric,conjunctive pink. Ears: Normal auditory acuity Neck: Supple, no masses.  Lungs: Clear throughout to auscultation Heart: Regular rate and rhythm Abdomen: Soft, nondistended, nontender. No masses or hepatomegaly noted. Normal bowel sounds Rectal: Dark brown, Hemoccult-negative stool in vault Musculoskeletal: Symmetrical with no gross deformities  Skin: No lesions on visible extremities Extremities: No edema or deformities noted Neurological: Oriented x 4, grossly nonfocal Cervical Nodes:  No significant cervical adenopathy Psychological:  Alert and cooperative. Normal mood and affect  ASSESSMENT AND PLAN : 1. recent passage of black stool, resolved.  Hemoglobin in the emergency department 07/28/12 was 12.4. Stool described as grossly Hemoccult-positive in the ED. Bleeding in the setting of Primaxin, aspirin and ibuprofen use. Rule out erosive disease, peptic ulcer disease, AVMs, neoplasm. For further evaluation patient will be scheduled for an upper endoscopy, on Pradaxa. The benefits, risks, and potential complications of EGD with possible biopsies were discussed with the patient and she agrees to proceed.   Will give him samples of a PPI to take daily until this can be further evaluated. I will recheck a CBC to make sure hemoglobin is stable. Further recommendations following upper endoscopy.  2. Multiple medical problems as listed in PMH. Patient on chronic Pradaxa.

## 2012-08-03 NOTE — Patient Instructions (Addendum)
Please go to the basement level to have your labs drawn.  We have scheduled the Endoscopy with Dr. Vania Rea. [name].   You will stay on the Pradaxa medication.   We have given you samples of Prilosec OTC.  Take 1 capsule 30 min before breakfast.

## 2012-08-04 NOTE — Progress Notes (Signed)
Reviewed and agree with management plan. Videl Nobrega T. France Lusty MD FACG 

## 2012-08-05 ENCOUNTER — Ambulatory Visit (AMBULATORY_SURGERY_CENTER): Payer: Medicare Other | Admitting: Gastroenterology

## 2012-08-05 ENCOUNTER — Other Ambulatory Visit: Payer: Self-pay | Admitting: *Deleted

## 2012-08-05 ENCOUNTER — Encounter: Payer: Self-pay | Admitting: Gastroenterology

## 2012-08-05 VITALS — BP 132/82 | HR 79 | Temp 96.8°F | Resp 97 | Ht 70.0 in | Wt 216.0 lb

## 2012-08-05 DIAGNOSIS — B3781 Candidal esophagitis: Secondary | ICD-10-CM

## 2012-08-05 DIAGNOSIS — K922 Gastrointestinal hemorrhage, unspecified: Secondary | ICD-10-CM

## 2012-08-05 MED ORDER — SODIUM CHLORIDE 0.9 % IV SOLN: 500.0000 mL | INTRAVENOUS | Status: DC

## 2012-08-05 MED ORDER — FLUCONAZOLE 100 MG PO TABS: ORAL_TABLET | ORAL | Status: DC

## 2012-08-05 NOTE — Progress Notes (Signed)
Patient did not experience any of the following events: a burn prior to discharge; a fall within the facility; wrong site/side/patient/procedure/implant event; or a hospital transfer or hospital admission upon discharge from the facility. (G8907) Patient did not have preoperative order for IV antibiotic SSI prophylaxis. (G8918)  

## 2012-08-05 NOTE — Patient Instructions (Addendum)

## 2012-08-05 NOTE — Op Note (Signed)
[facility] [address] [place], [address]   ENDOSCOPY PROCEDURE REPORT  PATIENT: Clayton, [name]  MR#: [id number] BIRTHDATE: December 29, 1930 , 80  yrs. old GENDER: Male ENDOSCOPIST:Zoeya Gramajo Hale Bogus, MD, Clementeen Graham REFERRED BY: Jacques Navy, M.D. PROCEDURE DATE:  08/05/2012 PROCEDURE:   EGD w/ biopsy ASA CLASS:    Class III INDICATIONS: melena. MEDICATION: Propofol (Diprivan) 180 mg IV TOPICAL ANESTHETIC:   Cetacaine Spray  DESCRIPTION OF PROCEDURE:   After the risks and benefits of the procedure were explained, informed consent was obtained.  The LB GIF-H180 [id number]  endoscope was introduced through the mouth  and advanced to the second portion of the duodenum .  The instrument was slowly withdrawn as the mucosa was fully examined.    Dense adherent candida in distal 2/3 esophagus biopsied...see pictures.   Mild antral gastritis,no ulcers or bleeding. Retroflexed views revealed no abnormalities.    The scope was then withdrawn from the patient and the procedure completed.  COMPLICATIONS: There were no complications.   ENDOSCOPIC IMPRESSION: 1.   Dense adherent candida in distal 2/3 esophagus biopsied...see pictures. 2.   Mild antral gastritis,no ulcers or bleeding.Marland Kitchen  RECOMMENDATIONS: 1.  await pathology results 2.   diflucan rx for 10 days 3.  continue current medications    _______________________________ eSigned:  Mardella Layman, MD, [facility] 08/05/2012 11:12 AM      PATIENT NAME:  Clayton, [name] MR#: [id number]

## 2012-08-06 ENCOUNTER — Telehealth: Payer: Self-pay | Admitting: *Deleted

## 2012-08-06 NOTE — Telephone Encounter (Signed)
Dr Luisa Hart called to report pt's path shows rare candida; sent to Dr Norval Gable box. Pt is on Diflucan.

## 2012-08-06 NOTE — Telephone Encounter (Signed)
  Follow up Call-  Call back number 08/05/2012 02/10/2012  Post procedure Call Back phone  # [phone] hm  Permission to leave phone message Yes Yes     Patient questions:  Do you have a fever, pain , or abdominal swelling? no Pain Score  0 *  Have you tolerated food without any problems? yes  Have you been able to return to your normal activities? yes  Do you have any questions about your discharge instructions: Diet   no Medications  no Follow up visit  no  Do you have questions or concerns about your Care? no  Actions: * If pain score is 4 or above: No action needed, pain <4.

## 2012-08-07 ENCOUNTER — Encounter: Payer: Self-pay | Admitting: Gastroenterology

## 2012-08-07 NOTE — Telephone Encounter (Signed)
Spoke with Dr Jarold Motto about path. Dr Jarold Motto says pt is ok with Diflucan. Phoned pt to make sure he is taking the diflucan and he is. Informed him to make sure he takes it all and that this should take care of the Candida; no other problems on path. Pt stated understanding.

## 2012-08-12 ENCOUNTER — Encounter: Payer: Self-pay | Admitting: *Deleted

## 2012-08-13 ENCOUNTER — Ambulatory Visit: Payer: Medicare Other | Admitting: Gastroenterology

## 2012-08-13 ENCOUNTER — Telehealth: Payer: Self-pay | Admitting: *Deleted

## 2012-08-13 NOTE — Telephone Encounter (Signed)
Willette Cluster notified that patient did not get CBC/diff as requested on 08/03/12.

## 2012-08-13 NOTE — Telephone Encounter (Signed)
Spoke with pt who stated he did not know he had an appt today. Also informed him he needs to have labs done. Pt reports he will come in next Monday for his labs. Depending on the result, I will ask Dr Jarold Motto if he still need to be seen; pt stated understanding.

## 2012-08-13 NOTE — Telephone Encounter (Signed)
Lmom for pt to call back. Pt missed his f/u appt today.

## 2012-08-14 ENCOUNTER — Emergency Department (HOSPITAL_COMMUNITY): Payer: Medicare Other

## 2012-08-14 ENCOUNTER — Encounter (HOSPITAL_COMMUNITY): Payer: Self-pay | Admitting: *Deleted

## 2012-08-14 ENCOUNTER — Observation Stay (HOSPITAL_COMMUNITY)
Admission: EM | Admit: 2012-08-14 | Discharge: 2012-08-15 | DRG: 315 | Disposition: A | Discharge status: Home / Self Care | Payer: Medicare Other | Attending: Internal Medicine | Admitting: Internal Medicine

## 2012-08-14 ENCOUNTER — Telehealth: Payer: Self-pay | Admitting: Gastroenterology

## 2012-08-14 DIAGNOSIS — Z951 Presence of aortocoronary bypass graft: Secondary | ICD-10-CM

## 2012-08-14 DIAGNOSIS — I4891 Unspecified atrial fibrillation: Secondary | ICD-10-CM | POA: Diagnosis present

## 2012-08-14 DIAGNOSIS — D6489 Other specified anemias: Secondary | ICD-10-CM | POA: Diagnosis present

## 2012-08-14 DIAGNOSIS — E119 Type 2 diabetes mellitus without complications: Secondary | ICD-10-CM | POA: Diagnosis present

## 2012-08-14 DIAGNOSIS — M1990 Unspecified osteoarthritis, unspecified site: Secondary | ICD-10-CM | POA: Diagnosis present

## 2012-08-14 DIAGNOSIS — K922 Gastrointestinal hemorrhage, unspecified: Secondary | ICD-10-CM | POA: Diagnosis present

## 2012-08-14 DIAGNOSIS — Z6830 Body mass index (BMI) 30.0-30.9, adult: Secondary | ICD-10-CM

## 2012-08-14 DIAGNOSIS — Z79899 Other long term (current) drug therapy: Secondary | ICD-10-CM

## 2012-08-14 DIAGNOSIS — E669 Obesity, unspecified: Secondary | ICD-10-CM | POA: Diagnosis present

## 2012-08-14 DIAGNOSIS — I9589 Other hypotension: Principal | ICD-10-CM | POA: Diagnosis present

## 2012-08-14 DIAGNOSIS — D649 Anemia, unspecified: Secondary | ICD-10-CM | POA: Diagnosis present

## 2012-08-14 DIAGNOSIS — I2510 Atherosclerotic heart disease of native coronary artery without angina pectoris: Secondary | ICD-10-CM | POA: Diagnosis present

## 2012-08-14 DIAGNOSIS — I951 Orthostatic hypotension: Secondary | ICD-10-CM

## 2012-08-14 DIAGNOSIS — I10 Essential (primary) hypertension: Secondary | ICD-10-CM | POA: Diagnosis present

## 2012-08-14 DIAGNOSIS — I959 Hypotension, unspecified: Secondary | ICD-10-CM | POA: Diagnosis present

## 2012-08-14 DIAGNOSIS — E785 Hyperlipidemia, unspecified: Secondary | ICD-10-CM | POA: Diagnosis present

## 2012-08-14 DIAGNOSIS — I252 Old myocardial infarction: Secondary | ICD-10-CM

## 2012-08-14 LAB — CBC
Hemoglobin: 10.3 g/dL — ABNORMAL LOW (ref 13.0–17.0)
MCH: 30.8 pg (ref 26.0–34.0)
MCV: 91.3 fL (ref 78.0–100.0)
Platelets: 184 10*3/uL (ref 150–400)
RBC: 3.34 MIL/uL — ABNORMAL LOW (ref 4.22–5.81)
WBC: 6.2 10*3/uL (ref 4.0–10.5)

## 2012-08-14 LAB — COMPREHENSIVE METABOLIC PANEL
ALT: 16 U/L (ref 0–53)
BUN: 31 mg/dL — ABNORMAL HIGH (ref 6–23)
CO2: 27 mEq/L (ref 19–32)
Calcium: 9.3 mg/dL (ref 8.4–10.5)
Creatinine, Ser: 1.42 mg/dL — ABNORMAL HIGH (ref 0.50–1.35)
GFR calc Af Amer: 53 mL/min — ABNORMAL LOW (ref >90)
GFR calc non Af Amer: 46 mL/min — ABNORMAL LOW (ref >90)
Glucose, Bld: 95 mg/dL (ref 70–99)
Total Protein: 6.9 g/dL (ref 6.0–8.3)

## 2012-08-14 LAB — CBC WITH DIFFERENTIAL/PLATELET
Basophils Absolute: 0 10*3/uL (ref 0.0–0.1)
Basophils Relative: 0 % (ref 0–1)
Eosinophils Absolute: 0.2 10*3/uL (ref 0.0–0.7)
Eosinophils Relative: 3 % (ref 0–5)
HCT: 30.4 % — ABNORMAL LOW (ref 39.0–52.0)
Hemoglobin: 10.4 g/dL — ABNORMAL LOW (ref 13.0–17.0)
MCH: 31.3 pg (ref 26.0–34.0)
MCHC: 34.2 g/dL (ref 30.0–36.0)
Monocytes Absolute: 0.6 10*3/uL (ref 0.1–1.0)
Monocytes Relative: 10 % (ref 3–12)
Neutro Abs: 3 10*3/uL (ref 1.7–7.7)
RDW: 12.8 % (ref 11.5–15.5)

## 2012-08-14 LAB — URINALYSIS, ROUTINE W REFLEX MICROSCOPIC
Hgb urine dipstick: NEGATIVE
Nitrite: NEGATIVE
Protein, ur: NEGATIVE mg/dL
Specific Gravity, Urine: 1.015 (ref 1.005–1.030)
Urobilinogen, UA: 0.2 mg/dL (ref 0.0–1.0)

## 2012-08-14 LAB — TYPE AND SCREEN: ABO/RH(D): A NEG

## 2012-08-14 LAB — PRO B NATRIURETIC PEPTIDE: Pro B Natriuretic peptide (BNP): 489.1 pg/mL — ABNORMAL HIGH (ref 0–450)

## 2012-08-14 LAB — URINE MICROSCOPIC-ADD ON

## 2012-08-14 LAB — TROPONIN I: Troponin I: <0.3 ng/mL (ref <0.30)

## 2012-08-14 MED ORDER — ATORVASTATIN CALCIUM 20 MG PO TABS
20.0000 mg | ORAL_TABLET | Freq: Every day | ORAL | Status: DC
  Administered 2012-08-15: 20 mg via ORAL
  Filled 2012-08-14: qty 1

## 2012-08-14 MED ORDER — ADULT MULTIVITAMIN W/MINERALS CH
1.0000 | ORAL_TABLET | Freq: Every day | ORAL | Status: DC
  Administered 2012-08-15: 1 via ORAL
  Filled 2012-08-14: qty 1

## 2012-08-14 MED ORDER — SODIUM CHLORIDE 0.9 % IV SOLN
INTRAVENOUS | Status: AC
  Administered 2012-08-14: 21:00:00 via INTRAVENOUS

## 2012-08-14 MED ORDER — METOPROLOL SUCCINATE 12.5 MG HALF TABLET
12.5000 mg | ORAL_TABLET | Freq: Every day | ORAL | Status: DC
  Administered 2012-08-15: 12.5 mg via ORAL
  Filled 2012-08-14: qty 1

## 2012-08-14 MED ORDER — SODIUM CHLORIDE 0.9 % IV BOLUS (SEPSIS)
1000.0000 mL | Freq: Once | INTRAVENOUS | Status: AC
  Administered 2012-08-14: 1000 mL via INTRAVENOUS

## 2012-08-14 MED ORDER — SODIUM CHLORIDE 0.9 % IJ SOLN
3.0000 mL | Freq: Two times a day (BID) | INTRAMUSCULAR | Status: DC
  Administered 2012-08-15: 3 mL via INTRAVENOUS

## 2012-08-14 MED ORDER — HYDROMORPHONE HCL PF 1 MG/ML IJ SOLN: 1.0000 mg | INTRAMUSCULAR | Status: DC | PRN

## 2012-08-14 MED ORDER — DEXTROSE-NACL 5-0.9 % IV SOLN
INTRAVENOUS | Status: DC
  Administered 2012-08-14: 23:00:00 via INTRAVENOUS

## 2012-08-14 MED ORDER — ONDANSETRON HCL 4 MG PO TABS: 4.0000 mg | ORAL_TABLET | Freq: Four times a day (QID) | ORAL | Status: DC | PRN

## 2012-08-14 MED ORDER — ASPIRIN EC 81 MG PO TBEC
81.0000 mg | DELAYED_RELEASE_TABLET | Freq: Every day | ORAL | Status: DC
  Administered 2012-08-15: 81 mg via ORAL
  Filled 2012-08-14: qty 1

## 2012-08-14 MED ORDER — ONDANSETRON HCL 4 MG/2ML IJ SOLN: 4.0000 mg | Freq: Four times a day (QID) | INTRAMUSCULAR | Status: DC | PRN

## 2012-08-14 MED ORDER — INSULIN ASPART 100 UNIT/ML ~~LOC~~ SOLN
0.0000 [IU] | SUBCUTANEOUS | Status: DC
  Administered 2012-08-15 (×2): 2 [IU] via SUBCUTANEOUS

## 2012-08-14 MED ORDER — AMLODIPINE BESYLATE 5 MG PO TABS
5.0000 mg | ORAL_TABLET | Freq: Every day | ORAL | Status: DC
  Administered 2012-08-15: 5 mg via ORAL
  Filled 2012-08-14: qty 1

## 2012-08-14 MED ORDER — PANTOPRAZOLE SODIUM 40 MG IV SOLR
40.0000 mg | Freq: Once | INTRAVENOUS | Status: AC
  Administered 2012-08-14: 40 mg via INTRAVENOUS
  Filled 2012-08-14: qty 40

## 2012-08-14 MED ORDER — DABIGATRAN ETEXILATE MESYLATE 150 MG PO CAPS
150.0000 mg | ORAL_CAPSULE | Freq: Two times a day (BID) | ORAL | Status: DC
  Administered 2012-08-15 (×2): 150 mg via ORAL
  Filled 2012-08-14 (×3): qty 1

## 2012-08-14 MED ORDER — LISINOPRIL 20 MG PO TABS
20.0000 mg | ORAL_TABLET | Freq: Every day | ORAL | Status: DC
  Administered 2012-08-15: 20 mg via ORAL
  Filled 2012-08-14: qty 1

## 2012-08-14 MED ORDER — NITROGLYCERIN 0.4 MG SL SUBL: 0.4000 mg | SUBLINGUAL_TABLET | SUBLINGUAL | Status: DC | PRN

## 2012-08-14 MED ORDER — VITAMIN D3 25 MCG (1000 UNIT) PO TABS
1000.0000 [IU] | ORAL_TABLET | Freq: Every day | ORAL | Status: DC
  Administered 2012-08-15: 1000 [IU] via ORAL
  Filled 2012-08-14: qty 1

## 2012-08-14 NOTE — ED Notes (Signed)
Attempt to call report to 4E; nurse to return call.

## 2012-08-14 NOTE — Progress Notes (Signed)
Called to receive report from ER nurse, Drenda Freeze, pt currently has no orders. ER nurse to call back when pt has orders. Thanks

## 2012-08-14 NOTE — ED Notes (Signed)
Pt reports dizziness whenever he stands up, reports having "black stool" this am x 1.  Pt reports having ENDO x 2 weeks ago d/t bloody stools, reports having "candida" in his esophagus and was given rx meds for it.  Pt reports one episode of bloody stool this am.  Pt denies abd pain or n/v at this time.

## 2012-08-14 NOTE — Telephone Encounter (Signed)
Pt states he did not receive his call informing him of his appt yesterday; he also missed a CBC that Willette Cluster, NP ordered on 08/03/12 . When I spoke with him yesterday, he was going to come in Monday for his CBC and I was going to ask Dr Jarold Motto for advice when the results were back. Today, pt reports he had a black, tarry stool this am and his BP is low. His BP was 84/60 once today, but at last check systolic was 110 and he gets dizzy at times when rising from a seated position.  Instructed pt to go to the ER, prefer [facility], but any is fine. Instructed him not to drive and if no one can take him, call 911. He now reports his SBP is 115, but I still encouraged him to go to an ER; asked him to update [place] on Monday. Pt stated understanding.

## 2012-08-14 NOTE — ED Notes (Signed)
Attempt to call report to 4E unsuccessful. Receiving nurse unavailable; to return call to ED when available.

## 2012-08-14 NOTE — ED Notes (Signed)
Le, MD at bedside.  

## 2012-08-14 NOTE — ED Notes (Signed)
Receiving RN from 4E returned call. Unable to given report at this time due to patient not having admission orders at this time.

## 2012-08-14 NOTE — ED Provider Notes (Signed)
History     CSN: [id number]  Arrival date & time 08/14/12  1603   First MD Initiated Contact with Patient 08/14/12 1656      Chief Complaint  Patient presents with  . Dizziness  . Hypotension    (Consider location/radiation/quality/duration/timing/severity/associated sxs/prior treatment) HPI Comments: Patient presents with 2 days of lightheadedness on standing with dizziness. His symptoms improved with standing. He denies any headache, vertigo, chest pain or shortness of breath. He has a history of EGD 2 weeks ago that showed gastritis and candidiasis. Report one episode of black stool this morning. Denies abdominal pain nausea or vomiting. Denies any seeing blood in the stool. Had a colonoscopy in March that was negative. He has a history of atrial fibrillation on pradaxa.  The history is provided by the patient.    Past Medical History  Diagnosis Date  . Knee pain   . CAD (coronary artery disease)     s/p CABG 1993 (L-LAD, S-RI, S-D1);   echo 1/10: EF 55%;    myoview 12/09: inf MI, no ischemia  . Hyperlipidemia   . HTN (hypertension)   . Folliculitis   . Dyspnea   . Hematuria   . Hematochezia   . Obesity   . Osteoarthritis   . Irritable bladder   . Sleep disorder   . Family history of malignant neoplasm of gastrointestinal tract   . Myocardial infarction   . Candida esophagitis 2013    EGD   . Antral gastritis 2013    EGD     Past Surgical History  Procedure Date  . Coronary artery bypass graft     graft '92: LIMA-LAD, SVG - D2,R1, D1  . Cholecystectomy     laparoscopic '92  . Mole removal 2001 and 2008  . Pilonidal cyst excision     Family History  Problem Relation Age of Onset  . Coronary artery disease Father     and brother  . Heart attack Father     and brother-fatal  . Stroke Father   . Breast cancer Mother   . Colon cancer Maternal Uncle   . Esophageal cancer Neg Hx   . Stomach cancer Neg Hx   . Rectal cancer Neg Hx     History  Substance  Use Topics  . Smoking status: Former Smoker -- 16 years    Quit date: 08/14/1964  . Smokeless tobacco: Never Used   Comment: quit 1965  . Alcohol Use: No      Review of Systems  Constitutional: Positive for activity change, appetite change and fatigue. Negative for fever.  HENT: Negative for congestion and rhinorrhea.   Eyes: Negative for visual disturbance.  Respiratory: Negative for cough, chest tightness and shortness of breath.   Cardiovascular: Negative for chest pain.  Gastrointestinal: Negative for nausea, vomiting and abdominal pain.  Genitourinary: Negative for dysuria and hematuria.  Musculoskeletal: Negative for back pain.  Skin: Negative for rash.  Neurological: Positive for dizziness, weakness and light-headedness. Negative for headaches.    Allergies  Review of patient's allergies indicates no known allergies.  Home Medications   No current outpatient prescriptions on file.  BP 136/81  Pulse 67  Temp 97.7 F (36.5 C) (Oral)  Resp 18  Ht 5\' 10"  (1.778 m)  Wt 214 lb 6.4 oz (97.251 kg)  BMI 30.76 kg/m2  SpO2 96%  Physical Exam  Constitutional: He is oriented to person, place, and time. He appears well-developed and well-nourished. No distress.  HENT:  Head: Normocephalic  and atraumatic.  Eyes: Conjunctivae and EOM are normal. Pupils are equal, round, and reactive to light.  Neck: Normal range of motion.  Cardiovascular: Normal rate and normal heart sounds.   No murmur heard.      Irregular rhythm  Pulmonary/Chest: Effort normal and breath sounds normal. No respiratory distress.  Abdominal: Soft. There is no tenderness. There is no rebound and no guarding.  Genitourinary: Guaiac positive stool.       No hemorrhoids, no fissures, black stool, guaiac positive  Musculoskeletal: Normal range of motion. He exhibits no edema and no tenderness.  Neurological: He is alert and oriented to person, place, and time. No cranial nerve deficit.  Skin: Skin is warm.      ED Course  Procedures (including critical care time)  Labs Reviewed  CBC WITH DIFFERENTIAL - Abnormal; Notable for the following:    RBC 3.32 (*)     Hemoglobin 10.4 (*)     HCT 30.4 (*)     All other components within normal limits  PROTIME-INR - Abnormal; Notable for the following:    Prothrombin Time 24.0 (*)     INR 2.11 (*)     All other components within normal limits  COMPREHENSIVE METABOLIC PANEL - Abnormal; Notable for the following:    BUN 31 (*)     Creatinine, Ser 1.42 (*)     Total Bilirubin 0.2 (*)     GFR calc non Af Amer 46 (*)     GFR calc Af Amer 53 (*)     All other components within normal limits  URINALYSIS, ROUTINE W REFLEX MICROSCOPIC - Abnormal; Notable for the following:    Leukocytes, UA TRACE (*)     All other components within normal limits  PRO B NATRIURETIC PEPTIDE - Abnormal; Notable for the following:    Pro B Natriuretic peptide (BNP) 489.1 (*)     All other components within normal limits  URINE MICROSCOPIC-ADD ON - Abnormal; Notable for the following:    Bacteria, UA FEW (*)     All other components within normal limits  CBC - Abnormal; Notable for the following:    RBC 3.34 (*)     Hemoglobin 10.3 (*)     HCT 30.5 (*)     All other components within normal limits  GLUCOSE, CAPILLARY - Abnormal; Notable for the following:    Glucose-Capillary 138 (*)     All other components within normal limits  TROPONIN I  TYPE AND SCREEN  ABO/RH  CBC  CBC   Dg Chest 2 View  08/14/2012  *RADIOLOGY REPORT*  Clinical Data: Dizziness.  CHEST - 2 VIEW  Comparison: None  Findings: The cardiac silhouette, mediastinal and hilar contours are within normal limits.  There is mild tortuosity and calcification of the thoracic aorta.  There are surgical changes from bypass surgery.  The lungs are clear.  No pleural effusion. The bony thorax is intact.  IMPRESSION: No acute cardiopulmonary findings.   Original Report Authenticated By: P. Loralie Champagne, M.D.       1. Anemia   2. Orthostatic hypotension       MDM  Dizziness with standing associated with fatigue and black stools. Vital stable, no distress. EGD from August 28 showed gastritis with candidiasis. Colonoscopy from March was negative.  Orthostatic vitals are negative. Patient still has lightheadedness with standing. His hemoglobin is 10.4, he was 15 in January. INR elevated at 2.1  IVF, PPI, admit for monitoring of hemoglobin.  Unlikely to pursue repeat endoscopy given recent studies.   Date: 08/14/2012  Rate: 65  Rhythm: atrial fibrillation  QRS Axis: normal  Intervals: normal  ST/T Wave abnormalities: normal  Conduction Disutrbances:none  Narrative Interpretation:   Old EKG Reviewed: unchanged        Glynn Octave, MD 08/15/12 [phone]

## 2012-08-15 DIAGNOSIS — I959 Hypotension, unspecified: Secondary | ICD-10-CM

## 2012-08-15 DIAGNOSIS — D649 Anemia, unspecified: Secondary | ICD-10-CM

## 2012-08-15 DIAGNOSIS — R42 Dizziness and giddiness: Secondary | ICD-10-CM

## 2012-08-15 LAB — CBC
HCT: 29.5 % — ABNORMAL LOW (ref 39.0–52.0)
Hemoglobin: 10.2 g/dL — ABNORMAL LOW (ref 13.0–17.0)
MCHC: 34.6 g/dL (ref 30.0–36.0)
MCV: 91.3 fL (ref 78.0–100.0)
RDW: 12.9 % (ref 11.5–15.5)
WBC: 7.4 10*3/uL (ref 4.0–10.5)

## 2012-08-15 LAB — IRON AND TIBC
Saturation Ratios: 8 % — ABNORMAL LOW (ref 20–55)
TIBC: 415 ug/dL (ref 215–435)
UIBC: 382 ug/dL (ref 125–400)

## 2012-08-15 LAB — GLUCOSE, CAPILLARY
Glucose-Capillary: 119 mg/dL — ABNORMAL HIGH (ref 70–99)
Glucose-Capillary: 138 mg/dL — ABNORMAL HIGH (ref 70–99)

## 2012-08-15 LAB — FERRITIN: Ferritin: 18 ng/mL — ABNORMAL LOW (ref 22–322)

## 2012-08-15 LAB — RETICULOCYTES: Retic Ct Pct: 2.4 % (ref 0.4–3.1)

## 2012-08-15 MED ORDER — BENAZEPRIL HCL 20 MG PO TABS: 20.0000 mg | ORAL_TABLET | Freq: Every day | ORAL | Status: DC

## 2012-08-15 NOTE — Progress Notes (Signed)
Subjective: Mr. Necaise admitted for low Hgb and hypotension at home.   Objective: Lab: Lab Results  Component Value Date   WBC 7.4 08/15/2012   HGB 10.2* 08/15/2012   HCT 29.5* 08/15/2012   MCV 91.3 08/15/2012   PLT 173 08/15/2012   BMET    Component Value Date/Time   NA 140 08/14/2012 1720   K 4.1 08/14/2012 1720   CL 104 08/14/2012 1720   CO2 27 08/14/2012 1720   GLUCOSE 95 08/14/2012 1720   BUN 31* 08/14/2012 1720   CREATININE 1.42* 08/14/2012 1720   CREATININE 1.45* 08/16/2011 1617   CALCIUM 9.3 08/14/2012 1720   GFRNONAA 46* 08/14/2012 1720   GFRAA 53* 08/14/2012 1720     Imaging: CXR 08/14/12 - NAD  Scheduled Meds:   . sodium chloride   Intravenous STAT  . amLODipine  5 mg Oral Daily  . aspirin EC  81 mg Oral Daily  . atorvastatin  20 mg Oral Daily  . cholecalciferol  1,000 Units Oral Daily  . dabigatran  150 mg Oral Q12H  . insulin aspart  0-15 Units Subcutaneous Q4H  . lisinopril  20 mg Oral Daily  . metoprolol succinate  12.5 mg Oral Daily  . multivitamin with minerals  1 tablet Oral Daily  . pantoprazole (PROTONIX) IV  40 mg Intravenous Once  . sodium chloride  1,000 mL Intravenous Once  . sodium chloride  3 mL Intravenous Q12H   Continuous Infusions:   . dextrose 5 % and 0.9% NaCl 100 mL/hr at 08/14/12 2231   PRN Meds:.HYDROmorphone (DILAUDID) injection, nitroGLYCERIN, ondansetron (ZOFRAN) IV, ondansetron   Physical Exam: Filed Vitals:   08/15/12 0943  BP: 138/78  Pulse: 94  Temp: 98.4 F (36.9 C)  Resp: 20   WNWD white man in  No distress Cor - RRR PUlm - normal respirations Abd - soft, non-tender    Assessment/Plan: Anemia - stable Hgb but low. For anemia panel than d/c home. F/u with Dr. Jarold Motto  BP - OK  Dictated # [id number]   [name] [place] [facility] (o) [phone]; (c) [phone] Call-grp - [facility] Tele: [phone]  08/15/2012, 11:04 AM

## 2012-08-15 NOTE — H&P (Signed)
[facility] History and Physical  [name] YNW:[id number] DOB: 03/25/1931    PCP:   [name], MD   Chief Complaint: Low blood pressure and dizziness.  HPI: [name] is an 78 y.o. male with hx of CAD s/p CABG, HTN, antral gastritis, obesity, Hx of GI bleed, presents to the ER as he was taking his own BP at home and found it to be 70.  He admitted to be slightly lightheaded when he stood up too quickly.  He denied any syncope, chest pain or SOB.  He was found to be normotensive in the ER, and was given IVF bolus.  He also had progressive anemia, as in Jan 13, his Hb was 14g/DL, but a few weeks ago, it was 12 g/DL when he was seen at the urgent care.  Since then, he had EGD and Colonoscopy, and was found to have only some erosion.  His BUN is 30.  He denied any black stool, but said he had some bleeding hemorrhoids.  He was told not to take any NSAIDS, but he still take them when he plays golf.  He also has Afib and has been on Pradaxa.  Because of his progressive drop in Hb, and his hypotension, hospitalist was asked to admit him for further monitoring his Hb.  Rewiew of Systems:  Constitutional: Negative for malaise, fever and chills. No significant weight loss or weight gain Eyes: Negative for eye pain, redness and discharge, diplopia, visual changes, or flashes of light. ENMT: Negative for ear pain, hoarseness, nasal congestion, sinus pressure and sore throat. No headaches; tinnitus, drooling, or problem swallowing. Cardiovascular: Negative for chest pain, palpitations, diaphoresis, dyspnea and peripheral edema. ; No orthopnea, PND Respiratory: Negative for cough, hemoptysis, wheezing and stridor. No pleuritic chestpain. Gastrointestinal: Negative for nausea, vomiting, diarrhea, constipation, abdominal pain, melena,  hematemesis, jaundice.    Genitourinary: Negative for frequency, dysuria, incontinence,flank pain and hematuria; Musculoskeletal: Negative for back pain and neck  pain. Negative for swelling and trauma.;  Skin: . Negative for pruritus, rash, abrasions, bruising and skin lesion.; ulcerations Neuro: Negative for headache, lightheadedness and neck stiffness. Negative for weakness, altered level of consciousness , altered mental status, extremity weakness, burning feet, involuntary movement, seizure and syncope.  Psych: negative for anxiety, depression, insomnia, tearfulness, panic attacks, hallucinations, paranoia, suicidal or homicidal ideation    Past Medical History  Diagnosis Date  . Knee pain   . CAD (coronary artery disease)     s/p CABG 1993 (L-LAD, S-RI, S-D1);   echo 1/10: EF 55%;    myoview 12/09: inf MI, no ischemia  . Hyperlipidemia   . HTN (hypertension)   . Folliculitis   . Dyspnea   . Hematuria   . Hematochezia   . Obesity   . Osteoarthritis   . Irritable bladder   . Sleep disorder   . Family history of malignant neoplasm of gastrointestinal tract   . Myocardial infarction   . Candida esophagitis 2013    EGD   . Antral gastritis 2013    EGD     Past Surgical History  Procedure Date  . Coronary artery bypass graft     graft '92: LIMA-LAD, SVG - D2,R1, D1  . Cholecystectomy     laparoscopic '92  . Mole removal 2001 and 2008  . Pilonidal cyst excision     Medications:  HOME MEDS: Prior to Admission medications   Medication Sig Start Date End Date Taking? Authorizing Provider  amLODipine (NORVASC) 5 MG  tablet Take 5 mg by mouth daily.   Yes Historical Provider, MD  aspirin EC 81 MG tablet Take 81 mg by mouth daily.   Yes Historical Provider, MD  atorvastatin (LIPITOR) 20 MG tablet Take 20 mg by mouth daily.   Yes Historical Provider, MD  benazepril (LOTENSIN) 40 MG tablet Take 40 mg by mouth daily.   Yes Historical Provider, MD  Calcium Carbonate-Vitamin D (CALCIUM-VITAMIN D) 600-200 MG-UNIT CAPS Take 1 tablet by mouth daily.    Yes Historical Provider, MD  Cholecalciferol (VITAMIN D) 1000 UNITS capsule Take 1,000 Units  by mouth daily.     Yes Historical Provider, MD  Cyanocobalamin (VITAMIN B-12) 1000 MCG SUBL Place under the tongue.     Yes Historical Provider, MD  dabigatran (PRADAXA) 150 MG CAPS Take 150 mg by mouth every 12 (twelve) hours. 05/15/12  Yes Herby Abraham, MD  fluconazole (DIFLUCAN) 100 MG tablet Take 2 - 100mg  tabs by mouth on day 1, then 1 - 100mg  tab daily for 10 days 08/05/12  Yes Mardella Layman, MD  furosemide (LASIX) 40 MG tablet Take 40 mg by mouth daily.   Yes Historical Provider, MD  metoprolol succinate (TOPROL-XL) 25 MG 24 hr tablet Take 12.5 mg by mouth daily. Take 1/2 tablet (12.5 mg total) daily   Yes Historical Provider, MD  Multiple Vitamin (MULTIVITAMIN) capsule Take 1 capsule by mouth daily.     Yes Historical Provider, MD  nitroGLYCERIN (NITROSTAT) 0.4 MG SL tablet Place 0.4 mg under the tongue every 5 (five) minutes as needed.   Yes Historical Provider, MD     Allergies:  No Known Allergies  Social History:   reports that he quit smoking about 48 years ago. He has never used smokeless tobacco. He reports that he does not drink alcohol or use illicit drugs.  Family History: Family History  Problem Relation Age of Onset  . Coronary artery disease Father     and brother  . Heart attack Father     and brother-fatal  . Stroke Father   . Breast cancer Mother   . Colon cancer Maternal Uncle   . Esophageal cancer Neg Hx   . Stomach cancer Neg Hx   . Rectal cancer Neg Hx      Physical Exam: Filed Vitals:   08/14/12 1817 08/14/12 2130 08/14/12 2201 08/15/12 0500  BP: 122/69 131/75 136/81 118/71  Pulse: 70 77 67 68  Temp:   97.7 F (36.5 C) 98.6 F (37 C)  TempSrc:   Oral Oral  Resp: 20 17 18 18   Height:   5\' 10"  (1.778 m)   Weight:   97.251 kg (214 lb 6.4 oz)   SpO2: 99% 98% 96% 93%   Blood pressure 118/71, pulse 68, temperature 98.6 F (37 C), temperature source Oral, resp. rate 18, height 5\' 10"  (1.778 m), weight 97.251 kg (214 lb 6.4 oz), SpO2  93.00%.  GEN:  Pleasant patient lying in the stretcher in no acute distress; cooperative with exam. PSYCH:  alert and oriented x4; does not appear anxious or depressed; affect is appropriate. HEENT: Mucous membranes pink and anicteric; PERRLA; EOM intact; no cervical lymphadenopathy nor thyromegaly or carotid bruit; no JVD; There were no stridor. Neck is very supple. Breasts:: Not examined CHEST WALL: No tenderness CHEST: Normal respiration, clear to auscultation bilaterally.  HEART: Regular rate and rhythm.  There are no murmur, rub, or gallops.   BACK: No kyphosis or scoliosis; no CVA tenderness ABDOMEN: soft and  non-tender; no masses, no organomegaly, normal abdominal bowel sounds; no pannus; no intertriginous candida. There is no rebound and no distention. Rectal Exam: Not done EXTREMITIES: No bone or joint deformity; age-appropriate arthropathy of the hands and knees; no edema; no ulcerations.  There is no calf tenderness. Genitalia: not examined PULSES: 2+ and symmetric SKIN: Normal hydration no rash or ulceration CNS: Cranial nerves 2-12 grossly intact no focal lateralizing neurologic deficit.  Speech is fluent; uvula elevated with phonation, facial symmetry and tongue midline. DTR are normal bilaterally, cerebella exam is intact, barbinski is negative and strengths are equaled bilaterally.  No sensory loss.   Labs on Admission:  Basic Metabolic Panel:  Lab 08/14/12 9562  NA 140  K 4.1  CL 104  CO2 27  GLUCOSE 95  BUN 31*  CREATININE 1.42*  CALCIUM 9.3  MG --  PHOS --   Liver Function Tests:  Lab 08/14/12 1720  AST 22  ALT 16  ALKPHOS 40  BILITOT 0.2*  PROT 6.9  ALBUMIN 3.9   No results found for this basename: LIPASE:5,AMYLASE:5 in the last 168 hours No results found for this basename: AMMONIA:5 in the last 168 hours CBC:  Lab 08/14/12 2233 08/14/12 1720  WBC 6.2 5.6  NEUTROABS -- 3.0  HGB 10.3* 10.4*  HCT 30.5* 30.4*  MCV 91.3 91.6  PLT 184 182    Cardiac Enzymes:  Lab 08/14/12 1720  CKTOTAL --  CKMB --  CKMBINDEX --  TROPONINI <0.30    CBG:  Lab 08/15/12 0357 08/15/12  GLUCAP 119* 138*     Radiological Exams on Admission: Dg Chest 2 View  08/14/2012  *RADIOLOGY REPORT*  Clinical Data: Dizziness.  CHEST - 2 VIEW  Comparison: None  Findings: The cardiac silhouette, mediastinal and hilar contours are within normal limits.  There is mild tortuosity and calcification of the thoracic aorta.  There are surgical changes from bypass surgery.  The lungs are clear.  No pleural effusion. The bony thorax is intact.  IMPRESSION: No acute cardiopulmonary findings.   Original Report Authenticated By: P. Loralie Champagne, M.D.     EKG: Independently reviewed. No sign of ischemia.   Assessment/Plan Present on Admission:  .Anemia .Hypotension arterial .AODM .Atrial fibrillation .CAD, ARTERY BYPASS GRAFT .HYPERLIPIDEMIA .OBESITY .OSTEOARTHRITIS .Upper gastrointestinal bleed  PLAN:  I suspect this is a very slow GI bleed.  He had both EGD and colonoscopy very recently without any significant source of bleeding and I don't think this needs to be repeated.  I have put him on clear liquid, but if his Hb is stable, please advance his diet and start him on Iron.  I have not because it would be difficult to do EGD if he bleeds significantly.  With respect to his BP, he really doesn't need to be on high dose of lisinopril so I have reduced it to 20mg .  I have continued his Pradaxa as I didn't think his bleeding is significant with recent upper and lower GI along with him having known hemorrhoids.  He is stable, full code, and will be admitted to [facility].  Other plans as per orders.  Code Status: FULL Unk Lightning, MD. [facility] Pager [phone] 7pm to 7am.  08/15/2012, 5:54 AM

## 2012-08-16 NOTE — Discharge Summary (Signed)
[name], [name] NO.:  [id number]  MEDICAL RECORD NO.:  [id number]  LOCATION:  1420                         FACILITY:  [facility]  PHYSICIAN:  Clayton Gess. Norins, MD  DATE OF BIRTH:  February 13, 1931  DATE OF ADMISSION:  08/14/2012 DATE OF DISCHARGE:  08/15/2012                              DISCHARGE SUMMARY   ADMITTING DIAGNOSES: 1. Anemia. 2. Hypertension.  DISCHARGE DIAGNOSES: 1. Anemia. 2. Hypertension.  CONSULTATIONS:  None.  PROCEDURES:  None.  HISTORY OF PRESENT ILLNESS:  [name] is a 78 year old Caucasian gentleman followed for hypertension.  He also has been followed by Dr. Sheryn [name] for anemia and question GI bleed.  He has had recent endoscopy, which revealed yeast esophagitis, otherwise unremarkable, colonoscopy which was unremarkable.  The patient was doing well, but on the day of admission, he noted he had a black stool and also his blood pressure at home was low with a systolic blood pressure by report at 70. For this reason, he presented to the Emergency Department at [facility].  At that point, his hemoglobin was found to be 10.4 g.  He was given IV fluids.  His blood pressure responded nicely, but because of question of active ongoing GI bleed, he was admitted to the hospital.  Please see the admission note for past medical history, family history, social history, and physical exam on admission.  Also prior epic notes.  HOSPITAL COURSE:  The patient was admitted to regular medical floor. His hemoglobin remained stable with a followup of 10.2 g.  He had no recurrent black stools.  His blood pressure has been stable.  With no sign of active bleeding with a stable hemoglobin with blood pressure being at a reasonable level, he at this point is ready for discharge to home.  DISCHARGE PHYSICAL EXAMINATION:  VITAL SIGNS:  Temperature was 98.4, blood pressure 138/78, pulse 94, respirations 20, O2 sats 95% on room air. GENERAL  APPEARANCE:  A well-nourished, well-developed Caucasian gentleman in no acute distress. HEENT:  Conjunctivae and sclerae were clear. CHEST:  Clear with no rales, wheezes, or rhonchi. CARDIOVASCULAR:  2+ radial pulses.  Precordium was quiet.  He had regular rate and rhythm. ABDOMEN:  Nontender. NEUROLOGIC:  The patient is awake, alert.  He is oriented to person, place, time, and context.  Moves all extremities to command and has no focal abnormalities.  FINAL LABORATORY DATA:  Hemoglobin this morning was 10.2 g, white count 7400, platelet count 173,000.  Chemistries at admission were unremarkable with a BUN of 31, creatinine 1.42, glucose was 95.  Liver functions were normal.  He did have a troponin that was less than 0.30. ProBNP was 489.  The patient did have iron studies done on January 24, 2012, with an iron of 109.  Additional lab ordered and pending at the time of discharge the patient's anemia panel to recheck iron levels, check B12 and reticulocyte count.  DISPOSITION:  The patient is to be discharged home.  He will continue all his home medications except his benazepril will be decreased from 40 mg daily to 20 mg daily.  The patient will be seen in followup  in 5-7 days and he will be contacted by the office for the appointment.  The patient is to be referred back to Dr. Sheryn [name] for continued evaluation for his anemia and possible blood loss.  The patient carefully instructed that for recurrent hypertension, recurrent signs of GI bleeding, he is to return for further evaluation either to the office if open or to the emergency department.  The patient's condition at the time of discharge dictation is medically stable with no sign of active bleeding.     Clayton Gess Norins, MD     MEN/MEDQ  D:  08/15/2012  T:  08/16/2012  Job:  [id number]

## 2012-08-17 NOTE — Telephone Encounter (Signed)
Pt went to ER on 08/14/12 and labs were done. Pt is to f/u with Dr Jarold Motto.

## 2012-08-18 ENCOUNTER — Telehealth: Payer: Self-pay | Admitting: *Deleted

## 2012-08-18 NOTE — Telephone Encounter (Signed)
Pt lmom that he will see Dr Debby Bud on Thursday and he would like an appt with Dr Jarold Motto to f/u ER visit last week. Line busy.

## 2012-08-20 ENCOUNTER — Ambulatory Visit (INDEPENDENT_AMBULATORY_CARE_PROVIDER_SITE_OTHER): Payer: Medicare Other | Admitting: Internal Medicine

## 2012-08-20 ENCOUNTER — Other Ambulatory Visit (INDEPENDENT_AMBULATORY_CARE_PROVIDER_SITE_OTHER): Payer: Medicare Other

## 2012-08-20 ENCOUNTER — Encounter (HOSPITAL_COMMUNITY): Payer: Self-pay | Admitting: *Deleted

## 2012-08-20 ENCOUNTER — Inpatient Hospital Stay (HOSPITAL_COMMUNITY)
Admission: AD | Admit: 2012-08-20 | Discharge: 2012-08-24 | DRG: 378 | Disposition: A | Discharge status: Home / Self Care | Payer: Medicare Other | Source: Ambulatory Visit | Attending: Internal Medicine | Admitting: Internal Medicine

## 2012-08-20 ENCOUNTER — Encounter: Payer: Self-pay | Admitting: Internal Medicine

## 2012-08-20 VITALS — BP 92/60 | HR 82 | Temp 97.6°F | Resp 16 | Wt 214.0 lb

## 2012-08-20 DIAGNOSIS — Z951 Presence of aortocoronary bypass graft: Secondary | ICD-10-CM

## 2012-08-20 DIAGNOSIS — K921 Melena: Secondary | ICD-10-CM

## 2012-08-20 DIAGNOSIS — I4891 Unspecified atrial fibrillation: Secondary | ICD-10-CM

## 2012-08-20 DIAGNOSIS — D62 Acute posthemorrhagic anemia: Secondary | ICD-10-CM | POA: Diagnosis present

## 2012-08-20 DIAGNOSIS — Z9089 Acquired absence of other organs: Secondary | ICD-10-CM

## 2012-08-20 DIAGNOSIS — E785 Hyperlipidemia, unspecified: Secondary | ICD-10-CM

## 2012-08-20 DIAGNOSIS — Z6827 Body mass index (BMI) 27.0-27.9, adult: Secondary | ICD-10-CM

## 2012-08-20 DIAGNOSIS — K922 Gastrointestinal hemorrhage, unspecified: Secondary | ICD-10-CM

## 2012-08-20 DIAGNOSIS — Z8249 Family history of ischemic heart disease and other diseases of the circulatory system: Secondary | ICD-10-CM

## 2012-08-20 DIAGNOSIS — I2510 Atherosclerotic heart disease of native coronary artery without angina pectoris: Secondary | ICD-10-CM | POA: Diagnosis present

## 2012-08-20 DIAGNOSIS — Z7901 Long term (current) use of anticoagulants: Secondary | ICD-10-CM

## 2012-08-20 DIAGNOSIS — E669 Obesity, unspecified: Secondary | ICD-10-CM | POA: Diagnosis present

## 2012-08-20 DIAGNOSIS — I959 Hypotension, unspecified: Secondary | ICD-10-CM

## 2012-08-20 DIAGNOSIS — Z87891 Personal history of nicotine dependence: Secondary | ICD-10-CM

## 2012-08-20 DIAGNOSIS — Z823 Family history of stroke: Secondary | ICD-10-CM

## 2012-08-20 DIAGNOSIS — D649 Anemia, unspecified: Secondary | ICD-10-CM

## 2012-08-20 DIAGNOSIS — I10 Essential (primary) hypertension: Secondary | ICD-10-CM

## 2012-08-20 LAB — CBC WITH DIFFERENTIAL/PLATELET
Eosinophils Absolute: 0.1 10*3/uL (ref 0.0–0.7)
HCT: 20.9 % — CL (ref 39.0–52.0)
Lymphs Abs: 1.4 10*3/uL (ref 0.7–4.0)
MCHC: 33.3 g/dL (ref 30.0–36.0)
MCV: 94.3 fl (ref 78.0–100.0)
Monocytes Absolute: 0.7 10*3/uL (ref 0.1–1.0)
Neutrophils Relative %: 70.5 % (ref 43.0–77.0)
Platelets: 193 10*3/uL (ref 150.0–400.0)

## 2012-08-20 LAB — COMPREHENSIVE METABOLIC PANEL
ALT: 13 U/L (ref 0–53)
BUN: 33 mg/dL — ABNORMAL HIGH (ref 6–23)
CO2: 25 mEq/L (ref 19–32)
Calcium: 8.4 mg/dL (ref 8.4–10.5)
GFR calc Af Amer: 68 mL/min — ABNORMAL LOW (ref >90)
GFR calc non Af Amer: 58 mL/min — ABNORMAL LOW (ref >90)
Glucose, Bld: 117 mg/dL — ABNORMAL HIGH (ref 70–99)
Sodium: 138 mEq/L (ref 135–145)

## 2012-08-20 MED ORDER — NITROGLYCERIN 0.4 MG SL SUBL: 0.4000 mg | SUBLINGUAL_TABLET | SUBLINGUAL | Status: DC | PRN

## 2012-08-20 MED ORDER — SENNOSIDES-DOCUSATE SODIUM 8.6-50 MG PO TABS
1.0000 | ORAL_TABLET | Freq: Every evening | ORAL | Status: DC | PRN
  Filled 2012-08-20: qty 1

## 2012-08-20 MED ORDER — ONDANSETRON HCL 4 MG/2ML IJ SOLN: 4.0000 mg | Freq: Four times a day (QID) | INTRAMUSCULAR | Status: DC | PRN

## 2012-08-20 MED ORDER — ACETAMINOPHEN 325 MG PO TABS: 650.0000 mg | ORAL_TABLET | Freq: Four times a day (QID) | ORAL | Status: DC | PRN

## 2012-08-20 MED ORDER — ATORVASTATIN CALCIUM 20 MG PO TABS
20.0000 mg | ORAL_TABLET | Freq: Every day | ORAL | Status: DC
  Administered 2012-08-20 – 2012-08-24 (×4): 20 mg via ORAL
  Filled 2012-08-20 (×5): qty 1

## 2012-08-20 MED ORDER — SODIUM CHLORIDE 0.9 % IJ SOLN
3.0000 mL | Freq: Two times a day (BID) | INTRAMUSCULAR | Status: DC
  Administered 2012-08-21: 3 mL via INTRAVENOUS

## 2012-08-20 MED ORDER — ACETAMINOPHEN 650 MG RE SUPP: 650.0000 mg | Freq: Four times a day (QID) | RECTAL | Status: DC | PRN

## 2012-08-20 MED ORDER — SODIUM CHLORIDE 0.9 % IV SOLN
INTRAVENOUS | Status: DC
  Administered 2012-08-20 – 2012-08-24 (×4): via INTRAVENOUS

## 2012-08-20 MED ORDER — FUROSEMIDE 10 MG/ML IJ SOLN
20.0000 mg | Freq: Once | INTRAMUSCULAR | Status: AC
  Administered 2012-08-20: 20 mg via INTRAVENOUS
  Filled 2012-08-20: qty 2

## 2012-08-20 MED ORDER — ONDANSETRON HCL 4 MG PO TABS: 4.0000 mg | ORAL_TABLET | Freq: Four times a day (QID) | ORAL | Status: DC | PRN

## 2012-08-20 MED ORDER — PANTOPRAZOLE SODIUM 40 MG IV SOLR
40.0000 mg | Freq: Two times a day (BID) | INTRAVENOUS | Status: DC
  Administered 2012-08-20 – 2012-08-21 (×2): 40 mg via INTRAVENOUS
  Filled 2012-08-20 (×3): qty 40

## 2012-08-20 NOTE — Progress Notes (Signed)
Blood Bank called.  Blood not available at this time.  Will call when units are ready for pick up. [name]. Lorn Junes, Charity fundraiser

## 2012-08-20 NOTE — H&P (Signed)
[name] is an 78 y.o. male.   Chief Complaint: Black stools and hypotension HPI: [name] presents for hospital follow up. He presented Sept 6th due to having a black stool and low blood pressure, SBP approx 70. His Hgb was 10.4 g. He was observed over 24 hr s and had no recurrent black stools and his blood pressure after fluids was 138/76. He was anxious to go home and he was discharged. Since d/c he has been active, played golf 9/11 and has been to the gym twice. He has been feeling a little weak, he has had several more black stools and his SBP has been soft and he has stopped taking all his BP meds. He does c/o chest pressure that is mild, no SOB, no abdominal pain. He has had some positional vertigo. Stat CBC reveals a Hgb of 7.0 g.  [name] had black stools in mid-August and was seen at [facility] ED where Hgb was 12.4g. He was seen by GI August 27th for this problem and had EGD August 28th revealing candida esophagitis and mild antral gastritis. He was treated with PPI and diflucan. His symptoms abated until Sept 6th as outlined above. He had also had colonoscopy March 4, '13. that was normal.  [name] is now admitted for transfusion and further GI evaluation.   Past Medical History  Diagnosis Date  . Knee pain   . CAD (coronary artery disease)     s/p CABG 1993 (L-LAD, S-RI, S-D1);   echo 1/10: EF 55%;    myoview 12/09: inf MI, no ischemia  . Hyperlipidemia   . HTN (hypertension)   . Folliculitis   . Dyspnea   . Hematuria   . Hematochezia   . Obesity   . Osteoarthritis   . Irritable bladder   . Sleep disorder   . Family history of malignant neoplasm of gastrointestinal tract   . Myocardial infarction   . Candida esophagitis 2013    EGD   . Antral gastritis 2013    EGD     Past Surgical History  Procedure Date  . Coronary artery bypass graft     graft '92: LIMA-LAD, SVG - D2,R1, D1  . Cholecystectomy     laparoscopic '92  . Mole removal 2001 and 2008  . Pilonidal  cyst excision     Family History  Problem Relation Age of Onset  . Coronary artery disease Father     and brother  . Heart attack Father     and brother-fatal  . Stroke Father   . Breast cancer Mother   . Colon cancer Maternal Uncle   . Esophageal cancer Neg Hx   . Stomach cancer Neg Hx   . Rectal cancer Neg Hx    Social History:  reports that he quit smoking about 48 years ago. He has never used smokeless tobacco. He reports that he does not drink alcohol or use illicit drugs.  Allergies: No Known Allergies  No prescriptions prior to admission    Results for orders placed in visit on 08/20/12 (from the past 48 hour(s))  CBC WITH DIFFERENTIAL     Status: Abnormal   Collection Time   08/20/12  5:03 PM      Component Value Range Comment   WBC 7.2  4.5 - 10.5 K/uL    RBC 2.22 aL (*) 4.22 - 5.81 Mil/uL    Hemoglobin 7.0 Repeated and verified X2. (*) 13.0 - 17.0 g/dL    HCT 20.9  Repeated and verified X2. (*) 39.0 - 52.0 %    MCV 94.3  78.0 - 100.0 fl    MCHC 33.3  30.0 - 36.0 g/dL    RDW 25.3  66.4 - 40.3 %    Platelets 193.0  150.0 - 400.0 K/uL    Neutrophils Relative 70.5  43.0 - 77.0 %    Lymphocytes Relative 19.2  12.0 - 46.0 %    Monocytes Relative 9.2  3.0 - 12.0 %    Eosinophils Relative 0.9  0.0 - 5.0 %    Basophils Relative 0.2  0.0 - 3.0 %    Neutro Abs 5.1  1.4 - 7.7 K/uL    Lymphs Abs 1.4  0.7 - 4.0 K/uL    Monocytes Absolute 0.7  0.1 - 1.0 K/uL    Eosinophils Absolute 0.1  0.0 - 0.7 K/uL    Basophils Absolute 0.0  0.0 - 0.1 K/uL    No results found.  Review of Systems  Constitutional: Negative for fever, chills, weight loss, malaise/fatigue and diaphoresis.  HENT: Negative.   Eyes: Negative.   Respiratory: Negative.   Cardiovascular: Positive for chest pain. Negative for palpitations and claudication.  Gastrointestinal: Positive for blood in stool. Negative for heartburn, nausea and abdominal pain.  Genitourinary: Negative.   Musculoskeletal: Negative.    Skin: Negative.   Neurological: Negative.   Endo/Heme/Allergies: Negative.   Psychiatric/Behavioral: Negative.     There were no vitals taken for this visit. Physical Exam  BP:  92/60   Pulse:  82   Temp:  97.6 F (36.4 C)   Resp:  16    Wt Readings from Last 3 Encounters:   08/20/12  214 lb (97.07 kg)   08/14/12  214 lb 6.4 oz (97.251 kg)   08/05/12  216 lb (97.977 kg)    Gen'l- WNWD white man who is a little pale  HEENT - C&S clear  Cor- IRIR rate controlled  Pulm - normal respirations, CTAP  Abd- BS+, soft, no guarding or rebound, no tenderness to percussion or deep palpation.  Rect - NST, no mass, Prostate nl., stool strongly guiaic positive.   Assessment/Plan 1. GI bleed - most likely an UGI bleed exacerbated by being on Pradaxa. He had Iron of 33, % sat of 8 on Sept 7th labs that returned after discharge. He had EGD August 28th as noted in HPI. He has no abdominal pain and is minimally symptomatic.  Plan Admit tele  T&C and transfuse 3 units PRBCs  IV protonix 40 mg q 12  Hold pradaxa.  2. Cardiovascular - minimal chest discomfort, no limitation in activity. Suspect anemia related  Plan Tele admit  12 lead EKG  Unless abnormal EGD will not do cardiac enzymes  3. HTN   Will hold all his BP meds until blood pressure is stabilized  4. A. Fib - patient is rate controlled  Hold Pradaxa  [name] 08/20/2012, 7:32 PM (c) [phone]  Call group- [facility] [phone]

## 2012-08-20 NOTE — Progress Notes (Signed)
Subjective:    Patient ID: [name], male    DOB: 1931/01/02, 78 y.o.   MRN: [id number]  HPI Mr. Townley presents fore hospital follow up. He presented Sept 6th due to having a black stool and low blood pressure, SBP approx 70. His Hgb was 10.4 g. He was observed over 24 hr s and had no recurrent black stools and his blood pressure after fluids was 138/76. He was anxious to go home and he was discharged. Since d/c he has been active, played golf 9/11 and has been to the gym twice. He has been feeling a little weak, he has had several more black stools and his SBP has been soft and he has stopped taking all his BP meds. He does c/o chest pressure that is mild, no SOB, no abdominal pain. He has had some positional vertigo.  Past Medical History  Diagnosis Date  . Knee pain   . CAD (coronary artery disease)     s/p CABG 1993 (L-LAD, S-RI, S-D1);   echo 1/10: EF 55%;    myoview 12/09: inf MI, no ischemia  . Hyperlipidemia   . HTN (hypertension)   . Folliculitis   . Dyspnea   . Hematuria   . Hematochezia   . Obesity   . Osteoarthritis   . Irritable bladder   . Sleep disorder   . Family history of malignant neoplasm of gastrointestinal tract   . Myocardial infarction   . Candida esophagitis 2013    EGD   . Antral gastritis 2013    EGD    Past Surgical History  Procedure Date  . Coronary artery bypass graft     graft '92: LIMA-LAD, SVG - D2,R1, D1  . Cholecystectomy     laparoscopic '92  . Mole removal 2001 and 2008  . Pilonidal cyst excision    Family History  Problem Relation Age of Onset  . Coronary artery disease Father     and brother  . Heart attack Father     and brother-fatal  . Stroke Father   . Breast cancer Mother   . Colon cancer Maternal Uncle   . Esophageal cancer Neg Hx   . Stomach cancer Neg Hx   . Rectal cancer Neg Hx    History   Social History  . Marital Status: Widowed    Spouse Name: N/A    Number of Children: 1  . Years of Education: N/A    Occupational History  . Retired    Social History Main Topics  . Smoking status: Former Smoker -- 16 years    Quit date: 08/14/1964  . Smokeless tobacco: Never Used   Comment: quit 1965  . Alcohol Use: No  . Drug Use: No  . Sexually Active: Not on file   Other Topics Concern  . 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 Married '57- widowed 04/19/23. 1 son -'44- died '2023-04-26 MVA. Retired: keeps himself busy. ACP - not reviewed (Jan '13)    Current Outpatient Prescriptions on File Prior to Visit  Medication Sig Dispense Refill  . amLODipine (NORVASC) 5 MG tablet Take 5 mg by mouth daily.      Marland Kitchen aspirin EC 81 MG tablet Take 81 mg by mouth daily.      Marland Kitchen atorvastatin (LIPITOR) 20 MG tablet Take 20 mg by mouth daily.      . benazepril (LOTENSIN) 20 MG tablet Take 1 tablet (20 mg total) by  mouth daily.  30 tablet  11  . Calcium Carbonate-Vitamin D (CALCIUM-VITAMIN D) 600-200 MG-UNIT CAPS Take 1 tablet by mouth daily.       . Cholecalciferol (VITAMIN D) 1000 UNITS capsule Take 1,000 Units by mouth daily.        . Cyanocobalamin (VITAMIN B-12) 1000 MCG SUBL Place under the tongue.        . dabigatran (PRADAXA) 150 MG CAPS Take 150 mg by mouth every 12 (twelve) hours.      . furosemide (LASIX) 40 MG tablet Take 40 mg by mouth daily.      . metoprolol succinate (TOPROL-XL) 25 MG 24 hr tablet Take 12.5 mg by mouth daily. Take 1/2 tablet (12.5 mg total) daily      . Multiple Vitamin (MULTIVITAMIN) capsule Take 1 capsule by mouth daily.        . nitroGLYCERIN (NITROSTAT) 0.4 MG SL tablet Place 0.4 mg under the tongue every 5 (five) minutes as needed.      . fluconazole (DIFLUCAN) 100 MG tablet Take 2 - 100mg  tabs by mouth on day 1, then 1 - 100mg  tab daily for 10 days          Review of Systems System review is negative for any constitutional, cardiac, pulmonary, GI or neuro symptoms or complaints other than as described in the HPI.     Objective:   Physical Exam Filed  Vitals:   08/20/12 1605  BP: 92/60  Pulse: 82  Temp: 97.6 F (36.4 C)  Resp: 16   Wt Readings from Last 3 Encounters:  08/20/12 214 lb (97.07 kg)  08/14/12 214 lb 6.4 oz (97.251 kg)  08/05/12 216 lb (97.977 kg)   Gen'l- WNWD white man who is a little pale HEENT - C&S clear Cor- IRIR rate controlled Pulm - normal respirations, CTAP Abd- BS+, soft, no guarding or rebound, no tenderness to percussion or deep palpation. Rect - NST, no mass, Prostate nl., stool strongly guiaic positive.   Lab: Sept 7th drawn at the time of discharge - Retic 2.4%, absolute count 78.7; Fe 33, %sat 8      Assessment & Plan:  GI - patient with probable GI bleed with persistent black stools that are heme positive. His BP is low but otherwise he is asymptomatic - no typical chest pain, abdominal pain or SOB.  Plan - Stat CBC. If Hgb is dropped more than a gram from 10.2 will electively admit to hospital for transfusion and further work-up for source of bleeding.  If Hgb is not down more than 1 gram will arrange an urgent referral to GI.  Discussed this plan with the patient who prefers this approach to immediate admission to hospital.

## 2012-08-20 NOTE — Patient Instructions (Addendum)
Stool positive for blood. Lab from the hospital Sept 7th - low iron. There is a GI bleed! Plan - CBC tonight: if the Hemoglobin has dropped more than 1 g from Saturday you will be electively admitted directly to the hospital for transfusion and GI workup. If the Hemoglobin has not dropped - you will see the GI doctors very soon.

## 2012-08-21 DIAGNOSIS — K922 Gastrointestinal hemorrhage, unspecified: Principal | ICD-10-CM

## 2012-08-21 DIAGNOSIS — I959 Hypotension, unspecified: Secondary | ICD-10-CM

## 2012-08-21 DIAGNOSIS — K921 Melena: Secondary | ICD-10-CM

## 2012-08-21 DIAGNOSIS — D649 Anemia, unspecified: Secondary | ICD-10-CM

## 2012-08-21 DIAGNOSIS — I4891 Unspecified atrial fibrillation: Secondary | ICD-10-CM

## 2012-08-21 LAB — HEMOGLOBIN AND HEMATOCRIT, BLOOD
HCT: 26.8 % — ABNORMAL LOW (ref 39.0–52.0)
Hemoglobin: 9.5 g/dL — ABNORMAL LOW (ref 13.0–17.0)

## 2012-08-21 MED ORDER — FLUTICASONE PROPIONATE 50 MCG/ACT NA SUSP
1.0000 | Freq: Every day | NASAL | Status: DC
  Administered 2012-08-21 – 2012-08-24 (×4): 1 via NASAL
  Filled 2012-08-21: qty 16

## 2012-08-21 MED ORDER — PANTOPRAZOLE SODIUM 40 MG PO TBEC
40.0000 mg | DELAYED_RELEASE_TABLET | Freq: Every day | ORAL | Status: DC
  Administered 2012-08-24: 40 mg via ORAL
  Filled 2012-08-21 (×3): qty 1

## 2012-08-21 NOTE — Progress Notes (Signed)
Utilization review completed.  

## 2012-08-21 NOTE — Progress Notes (Signed)
Subjective: Feeling pretty good =- no abdominal pain. He was seen by Ms. Guenther today. He has had no chest pain or SOB. He does have chronic sinus drainage and congestion.  Objective: Lab: Lab Results  Component Value Date   WBC 7.2 08/20/2012   HGB 7.0 Repeated and verified X2.* 08/20/2012   HCT 20.9 Repeated and verified X2.* 08/20/2012   MCV 94.3 08/20/2012   PLT 193.0 08/20/2012   BMET    Component Value Date/Time   NA 138 08/20/2012 2158   K 3.8 08/20/2012 2158   CL 105 08/20/2012 2158   CO2 25 08/20/2012 2158   GLUCOSE 117* 08/20/2012 2158   BUN 33* 08/20/2012 2158   CREATININE 1.16 08/20/2012 2158   CREATININE 1.45* 08/16/2011 1617   CALCIUM 8.4 08/20/2012 2158   GFRNONAA 58* 08/20/2012 2158   GFRAA 68* 08/20/2012 2158     Imaging:  Scheduled Meds:   . atorvastatin  20 mg Oral Daily  . furosemide  20 mg Intravenous Once  . pantoprazole (PROTONIX) IV  40 mg Intravenous Q12H  . sodium chloride  3 mL Intravenous Q12H   Continuous Infusions:   . sodium chloride 50 mL/hr at 08/21/12 1039   PRN Meds:.acetaminophen, acetaminophen, nitroGLYCERIN, ondansetron (ZOFRAN) IV, ondansetron, senna-docusate   Physical Exam: Filed Vitals:   08/21/12 0824  BP: 104/53  Pulse: 66  Temp: 97.9 F (36.6 C)  Resp: 16   Gen'l - WNWD whie man in no distress HEENT_ C&S clear Cor- IRIR. Tele reviewed - several runs of V-tach noted Pulm - normal respirations Abdomen - BS+, soft, no guarding or rebound, no tenderness Neuro - A&O x 3     Assessment/Plan: 1. GI - s/p 3 unit transfusion. GI plans for EGD tomorrow.  Plan - f/u H/H in AM  2. Cardiac - no chest discomfort. Has had asymptomatic short runs of v-tach. 12 lead EKG pending. No copmplaint of chest pain  3. HTN - BP is still soft. Continue to hold BP meds  4. A. Fib - rate controlled. Off pradaxa.     [name] [facility] (o) [phone]; (c) [phone] Call-grp - Pats[facility] Sears IM Tele: [phone]  08/21/2012, 11:26  AM

## 2012-08-21 NOTE — Consult Note (Signed)
[facility] Consult: 10:19 AM 08/21/2012   Referring Provider: [name] Primary Care Physician:  [name], MD Primary Gastroenterologist:  Dr. Sheryn Bison   Reason for Consultation:  Melena, anemia  HPI: [name] is a 78 y.o. male.   Hx of CAD s/p CABG, A fib, HTN, antral gastritis.  On Pradaxa and 81 ASA  Admitted 9/6 - 9/8 for black stools, hypotension, Hgb was 10.4, 10.2 on follow up. Compares to 15.4 on 01/06/12 and apparently 12.4 in August in [place].  No recurrence black stools, discharged in stable condition.  Had just had EGD showing Candida on 8/28 and colonoscopy showing hemorrhoids in 02/2012 so further inpt GI testing was not pursued.  Was to follow up with Dr Jarold Motto in office.   He is readmitted as of 9/12 with several more black stools over past 2 to 3 days.  One to 2 formed black stools per day. , Accompanied by weakness, positional vertigo and Hgb of 7.0. # units PRBCs transfused overnight. BUN in low 30s now and last week.   Home meds include 81 ASA, Pradaxa.  No PPI or H2 blocker. Takes max of 400 mg Ibuprofen during the week. No weight loss, anorexia, odyno or dysphagia.  No abd pain.  No prior transfusions except mabye when he underwent CABG.    ENDOSCOPIC STUDIES: 08/05/12   EGD  Dr Jarold Motto for Melena To second duodenum. Candida esophagitis treated with 10 days of Diflucan (biopsy/path confirmed this). Mild antral gastritis   02/10/12  Colonoscopy  For BRBPR Internal hemorrhoids, o/w normal to cecum  Past Medical History  Diagnosis Date  . Knee pain   . CAD (coronary artery disease)     s/p CABG 1993 (L-LAD, S-RI, S-D1);   echo 1/10: EF 55%;    myoview 12/09: inf MI, no ischemia  . Hyperlipidemia   . HTN (hypertension)   . Folliculitis   . Dyspnea   . Hematuria   . Hematochezia   . Obesity   . Osteoarthritis   . Irritable bladder   . Sleep disorder   . Family history of malignant  neoplasm of gastrointestinal tract   . Myocardial infarction   . Candida esophagitis 2013    EGD   . Antral gastritis 2013    EGD     Past Surgical History  Procedure Date  . Coronary artery bypass graft     graft '92: LIMA-LAD, SVG - D2,R1, D1  . Cholecystectomy     laparoscopic '92  . Mole removal 2001 and 2008  . Pilonidal cyst excision     Prior to Admission medications   Medication Sig Start Date End Date Taking? Authorizing Provider  amLODipine (NORVASC) 5 MG tablet Take 5 mg by mouth daily.   Yes Historical Provider, MD  aspirin EC 81 MG tablet Take 81 mg by mouth daily.   Yes Historical Provider, MD  atorvastatin (LIPITOR) 20 MG tablet Take 20 mg by mouth daily.   Yes Historical Provider, MD  benazepril (LOTENSIN) 20 MG tablet Take 1 tablet (20 mg total) by mouth daily. 08/15/12  Yes Jacques Navy, MD  Calcium Carbonate-Vitamin D (CALCIUM-VITAMIN D) 600-200 MG-UNIT CAPS Take 1 tablet by mouth daily.    Yes Historical Provider, MD  Cholecalciferol (VITAMIN D) 1000 UNITS capsule Take 1,000 Units by mouth daily.     Yes Historical Provider, MD  Cinnamon 500 MG capsule Take 500 mg by mouth 2 (two) times daily.   Yes Historical Provider, MD  Coenzyme Q10 (COQ-10) 100 MG CAPS Take 1 capsule by mouth daily.   Yes Historical Provider, MD  dabigatran (PRADAXA) 150 MG CAPS Take 150 mg by mouth every 12 (twelve) hours. 05/15/12  Yes Herby Abraham, MD  furosemide (LASIX) 40 MG tablet Take 40 mg by mouth daily.   Yes Historical Provider, MD  glucosamine-chondroitin 500-400 MG tablet Take 1 tablet by mouth 2 (two) times daily.   Yes Historical Provider, MD  metoprolol succinate (TOPROL-XL) 25 MG 24 hr tablet Take 12.5 mg by mouth daily. Take 1/2 tablet (12.5 mg total) daily   Yes Historical Provider, MD  Multiple Vitamin (MULTIVITAMIN) capsule Take 1 capsule by mouth daily.     Yes Historical Provider, MD  OVER THE COUNTER MEDICATION Take 1 tablet by mouth daily. lethicin   Yes  Historical Provider, MD  Probiotic Product (PROBIOTIC DAILY PO) Take 1 tablet by mouth daily.   Yes Historical Provider, MD  vitamin B-12 (CYANOCOBALAMIN) 1000 MCG tablet Take 1,000 mcg by mouth daily.   Yes Historical Provider, MD  nitroGLYCERIN (NITROSTAT) 0.4 MG SL tablet Place 0.4 mg under the tongue every 5 (five) minutes as needed.    Historical Provider, MD    Scheduled Meds:    . atorvastatin  20 mg Oral Daily  . furosemide  20 mg Intravenous Once  . pantoprazole (PROTONIX) IV  40 mg Intravenous Q12H  . sodium chloride  3 mL Intravenous Q12H   Infusions:    . sodium chloride Stopped (08/21/12 0045)   PRN Meds: acetaminophen, acetaminophen, nitroGLYCERIN, ondansetron (ZOFRAN) IV, ondansetron, senna-docusate   Allergies as of 08/20/2012  . (No Known Allergies)    Family History  Problem Relation Age of Onset  . Coronary artery disease Father     and brother  . Heart attack Father     and brother-fatal  . Stroke Father   . Breast cancer Mother   . Colon cancer Maternal Uncle   . Esophageal cancer Neg Hx   . Stomach cancer Neg Hx   . Rectal cancer Neg Hx     History   Social History  . Marital Status: Widowed    Spouse Name: N/A    Number of Children: 1  . Years of Education: N/A   Occupational History  . Retired    Social History Main Topics  . Smoking status: Former Smoker -- 16 years    Quit date: 08/14/1964  . Smokeless tobacco: Never Used   Comment: quit 1965  . Alcohol Use: No  . Drug Use: No  . Sexually Active: Not on file   Other Topics Concern  . 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 Married '57- widowed Apr 26, 2023. 1 son -'92- died '2023-05-03 MVA. Retired: keeps himself busy. ACP - not reviewed (Jan '13)    REVIEW OF SYSTEMS: Constitutional: Negative for fever, chills, weight loss, malaise/fatigue and diaphoresis.  HENT: Negative. For nose bleeds, dental pain or problems.  Full set of dentures  Eyes: Negative for blurry,  double or loss of vision Respiratory: Negativefor DOE.  Cardiovascular:  Negative for palpitations and claudication. Pedal edema occurs after he is on his feet for a while, subsides with elevation of feet Gastrointestinal: Positive for blood in stool. Negative for heartburn, nausea and abdominal pain.  Genitourinary: Negative for more than 2 episodes nocturia per night. .  Musculoskeletal: Negativefor back pain, joint swelling  Skin: Negative.  Neurological:  Negative for loss of memory,  tremor, falls, speech difficultly,  Endo/Heme/Allergies: bruises easily Psychiatric/Behavioral: no agitation or depression   PHYSICAL EXAM: Vital signs in last 24 hours: Temp:  [97.6 F (36.4 C)-98.9 F (37.2 C)] 97.9 F (36.6 C) (09/13 0824) Pulse Rate:  [66-97] 66  (09/13 0824) Resp:  [12-18] 16  (09/13 0824) BP: (92-122)/(48-79) 104/53 mmHg (09/13 0824) SpO2:  [96 %-100 %] 96 % (09/13 0824) Weight:  [212 lb 14.4 oz (96.571 kg)-214 lb 6.4 oz (97.251 kg)] 212 lb 14.4 oz (96.571 kg) (09/13 1610)  General: pleasant, well-appearing older WM.  NAD Head:  No facial swelling or asymmetry  Eyes:  No icterus or pallor in conjunctiva Ears:  Not HOH  Nose:  No discharge or congestion Mouth:  Moist, clear oral MM.  Full dentures in place, not removed for exam.  No yeast Neck:  No JVD, masses or TMG Lungs:  Clear B.  No cough or dyspnea Heart: Slightly irregular, rate not depressed or accelerated.  Abdomen:  Soft, NT, ND.  BS active.  Not obese. Rectal: deferred, dark/black and FOB in ED   Musc/Skeltl: no joint deformity or swelling Extremities:  No pedal edema.  Feet warm  Neurologic:  Pleasant, good historian, no tremor Skin:   No rash, no purpura or bruising on trunk or limbs.  No telangesctasias.  Tattoos:  none Nodes:  No cervicalor groin adenopathy   Psych:  Pleasant, not anxious nor depressed.    LAB RESULTS:  Basename 08/20/12 1703  WBC 7.2  HGB 7.0 Repeated and verified X2.*  HCT 20.9  Repeated and verified X2.*  PLT 193.0   BMET Lab Results  Component Value Date   NA 138 08/20/2012   NA 140 08/14/2012   NA 142 01/06/2012   K 3.8 08/20/2012   K 4.1 08/14/2012   K 4.1 01/06/2012   CL 105 08/20/2012   CL 104 08/14/2012   CL 106 01/06/2012   CO2 25 08/20/2012   CO2 27 08/14/2012   CO2 27 01/06/2012   GLUCOSE 117* 08/20/2012   GLUCOSE 95 08/14/2012   GLUCOSE 93 01/06/2012   BUN 33* 08/20/2012   BUN 31* 08/14/2012   BUN 18 01/06/2012   CREATININE 1.16 08/20/2012   CREATININE 1.42* 08/14/2012   CREATININE 1.1 01/06/2012   CALCIUM 8.4 08/20/2012   CALCIUM 9.3 08/14/2012   CALCIUM 9.2 01/06/2012   LFT  Basename 08/20/12 2158  PROT 5.7*  ALBUMIN 3.2*  AST 24  ALT 13  ALKPHOS 32*  BILITOT 0.2*  BILIDIR --  IBILI --   PT/INR Lab Results  Component Value Date   INR 2.11* 08/14/2012   INR 1.1 RATIO* 02/07/2009    RADIOLOGY STUDIES: No results found.    IMPRESSION: *  GI bleed. Upper GI tract source suspected. No source on EGD of 08/05/12 EGD.  *  Candida esophagitis 8.28.  Has completed 10 days of diflucan.  Interestingly he has no immocompromising risk factors:  No prednisone; at worst glucose intolerance, no autoimune diseases.   *  ABL anemia.  S/p 3 units PRBC *  A Fib. Hx CAD and CABG. Chronic Pradaxa and ASA:  On hold.  *  Elevated INR,  Is this from Pradaxa?  PLAN: *  Enteroscopy tomorrow, slot for 9 AM with Dr Loreta Ave, weekend GI doc.  *  Await sent CBC, repeat this in AM.  PT/INR in AM *  Change to po Protonix.    LOS: 1 day   Jennye Moccasin  08/21/2012, 10:19 AM Pager:  [phone]       

## 2012-08-21 NOTE — Consult Note (Signed)
Chart was reviewed and patient was examined. X-rays were reviewed.  The patient has had recurrent GI bleeding in the face of antiplatelet therapy. This included a recent endoscopy because of bleeding. At this point I suspect he may have a small bowel lesion such as an AVM or submucosal tumor. Bleeding from active peptic ulcer disease is less likely.  Recommendations #1 hold predaxa #2 upper endoscopy and enteroscopy in a.m.; if not diagnostic would proceed with capsule endoscopy  Barbette Hair. Arlyce Dice, M.D., [facility] Cell [phone]

## 2012-08-22 ENCOUNTER — Encounter (HOSPITAL_COMMUNITY)
Admission: AD | Disposition: A | Discharge status: Home / Self Care | Payer: Self-pay | Source: Ambulatory Visit | Attending: Internal Medicine

## 2012-08-22 ENCOUNTER — Encounter (HOSPITAL_COMMUNITY): Payer: Self-pay | Admitting: *Deleted

## 2012-08-22 HISTORY — PX: ENTEROSCOPY: SHX5533

## 2012-08-22 LAB — CBC
Hemoglobin: 8.8 g/dL — ABNORMAL LOW (ref 13.0–17.0)
MCH: 30.9 pg (ref 26.0–34.0)
MCHC: 34.9 g/dL (ref 30.0–36.0)
Platelets: 176 10*3/uL (ref 150–400)
RDW: 13.9 % (ref 11.5–15.5)

## 2012-08-22 LAB — HEMOGLOBIN AND HEMATOCRIT, BLOOD: Hemoglobin: 9.4 g/dL — ABNORMAL LOW (ref 13.0–17.0)

## 2012-08-22 LAB — PROTIME-INR
INR: 1.32 (ref 0.00–1.49)
Prothrombin Time: 16.6 seconds — ABNORMAL HIGH (ref 11.6–15.2)

## 2012-08-22 SURGERY — ENTEROSCOPY
Anesthesia: Moderate Sedation

## 2012-08-22 MED ORDER — MIDAZOLAM HCL 10 MG/2ML IJ SOLN
INTRAMUSCULAR | Status: DC | PRN
  Administered 2012-08-22: 2 mg via INTRAVENOUS
  Administered 2012-08-22: 1 mg via INTRAVENOUS
  Administered 2012-08-22: 2 mg via INTRAVENOUS

## 2012-08-22 MED ORDER — SODIUM CHLORIDE 0.9 % IV SOLN: INTRAVENOUS | Status: DC

## 2012-08-22 MED ORDER — MIDAZOLAM HCL 10 MG/2ML IJ SOLN
INTRAMUSCULAR | Status: AC
  Filled 2012-08-22: qty 2

## 2012-08-22 MED ORDER — FENTANYL CITRATE 0.05 MG/ML IJ SOLN
INTRAMUSCULAR | Status: DC | PRN
  Administered 2012-08-22: 12.5 ug via INTRAVENOUS
  Administered 2012-08-22 (×2): 25 ug via INTRAVENOUS

## 2012-08-22 MED ORDER — FENTANYL CITRATE 0.05 MG/ML IJ SOLN
INTRAMUSCULAR | Status: AC
  Filled 2012-08-22: qty 2

## 2012-08-22 MED ORDER — LIDOCAINE VISCOUS 2 % MT SOLN
OROMUCOSAL | Status: DC | PRN
  Administered 2012-08-22: 1 via OROMUCOSAL

## 2012-08-22 MED ORDER — LIDOCAINE VISCOUS 2 % MT SOLN
OROMUCOSAL | Status: AC
  Filled 2012-08-22: qty 15

## 2012-08-22 NOTE — Progress Notes (Signed)
Subjective: Awake, alert, no complaints. He denies chest pain, SOB, abdominal pain. Tolerated EGD  Objective: Lab: Lab Results  Component Value Date   WBC 9.5 08/22/2012   HGB 8.8* 08/22/2012   HCT 25.2* 08/22/2012   MCV 88.4 08/22/2012   PLT 176 08/22/2012   BMET    Component Value Date/Time   NA 138 08/20/2012 2158   K 3.8 08/20/2012 2158   CL 105 08/20/2012 2158   CO2 25 08/20/2012 2158   GLUCOSE 117* 08/20/2012 2158   BUN 33* 08/20/2012 2158   CREATININE 1.16 08/20/2012 2158   CREATININE 1.45* 08/16/2011 1617   CALCIUM 8.4 08/20/2012 2158   GFRNONAA 58* 08/20/2012 2158   GFRAA 68* 08/20/2012 2158   Hgb: 7.0 to 9.5 to 8.8  Imaging: EGD - negative  Scheduled Meds:   . atorvastatin  20 mg Oral Daily  . fluticasone  1 spray Each Nare Daily  . pantoprazole  40 mg Oral Q0600  . sodium chloride  3 mL Intravenous Q12H   Continuous Infusions:   . sodium chloride 50 mL/hr at 08/21/12 1039  . sodium chloride    . sodium chloride     PRN Meds:.acetaminophen, acetaminophen, fentaNYL, lidocaine, midazolam, nitroGLYCERIN, ondansetron (ZOFRAN) IV, ondansetron, senna-docusate   Physical Exam: Filed Vitals:   08/22/12 1030  BP: 99/68  Pulse:   Temp:   Resp: 20   Gen'l- WNWD white man in no distress Cor- IRIR rate controlled Pulm - normal respirations Abd - BS+, soft, non-tender Neuro - A&O x 3      Assessment/Plan: 1. GI - continued drift down of Hgb. EGD negative. For capsule endoscopy to r/o small bowel AVMs vs other.  Plan- H/H @ 1500 hrs - if continued drift down will transfuse two add'l units PRBCs  2. Cardiac - a. Fib - rate controlled. Chest pressure has resolved.  3. BP remains soft - will continue to hold BP meds   [name] [name] [facility] (o) [phone]; (c) [phone] Call-grp - Patsi Sears IM Tele: [phone]  08/22/2012, 11:47 AM

## 2012-08-22 NOTE — Op Note (Signed)
[facility] [address] [place], [address]   OPERATIVE PROCEDURE REPORT  PATIENT: [name], [name]  MR#: [id number] BIRTHDATE: 1931-03-30 , 80  yrs. old GENDER: Male ENDOSCOPIST: Dr.  Lorenza Burton, MD REFERRED BY:  Louis Meckel, M.D.  PROCEDURE DATE: 08/22/2012 PROCEDURE:   Diagnostic small bowel enteroscopy . PRE-PROCEDURE PREPARATION: The patient was fasted for 8 hours prior to the procedure.  ASA CLASS:   Class III INDICATIONS:1.  iron deficiency anemia. MEDICATIONS: Fentanyl 62.5 mcg and Versed 5 mg IV TOPICAL ANESTHETIC:   Viscous xylocaine-15 cc PO.  DESCRIPTION OF PROCEDURE: After the risks benefits and alternatives of the procedure were thoroughly explained, informed consent was obtained.  The Pentax VSB-2900  endoscope was introduced through the mouth  and advanced to the third part of the duodenum , without limitations.   The instrument was slowly withdrawn as the mucosa was fully examined.    There entire exam from the mouth to the small bowel upto 150 cm appeared normal, No source of blood loss was identified. No AVM's, masses, polyps, erosions or ulcerations were noted. Retroflexed views revealed a hiatal hernia. A suction artifact was noted in the proximal stomach. The scope was then withdrawn from the patient and the procedure terminated.  COMPLICATIONS: There were no complications.  ENDOSCOPIC IMPRESSION: Small hiatl hernia; otherwise, normal enteroscopy upto 150 cm.  RECOMMENDATIONS: Proceed with a capsule endoscopy later today.  REPEAT EXAM: None planned for now  _______________________________ eSigned:  Dr. Lorenza Burton, MD 08/22/2012 10:24 AM  OZ:[id number] Arlyce Dice, MD

## 2012-08-23 LAB — TYPE AND SCREEN
Antibody Screen: NEGATIVE
Unit division: 0
Unit division: 0

## 2012-08-23 LAB — HEMOGLOBIN AND HEMATOCRIT, BLOOD: HCT: 26.2 % — ABNORMAL LOW (ref 39.0–52.0)

## 2012-08-23 MED ORDER — VITAMIN B-12 1000 MCG PO TABS
1000.0000 ug | ORAL_TABLET | Freq: Every day | ORAL | Status: DC
  Administered 2012-08-24: 1000 ug via ORAL
  Filled 2012-08-23: qty 1

## 2012-08-23 MED ORDER — MAGNESIUM HYDROXIDE 400 MG/5ML PO SUSP
30.0000 mL | Freq: Every day | ORAL | Status: DC
  Administered 2012-08-23 – 2012-08-24 (×2): 30 mL via ORAL
  Filled 2012-08-23 (×2): qty 30

## 2012-08-23 NOTE — Progress Notes (Signed)
Subjective: Cross cover [facility] Since I last evaluated the patient, he has been stable. No evidence of GI blood loss. No BM today. Capsule stidy completed. He denies having any abdominal pain, nausea etc. Objective: Vital signs in last 24 hours: Temp:  [98.5 F (36.9 C)-99.8 F (37.7 C)] 98.5 F (36.9 C) (09/15 0628) Pulse Rate:  [99-110] 102  (09/15 0628) Resp:  [16-20] 16  (09/15 0628) BP: (97-142)/(59-96) 97/59 mmHg (09/15 0628) SpO2:  [93 %-96 %] 94 % (09/15 0628) Weight:  [94.031 kg (207 lb 4.8 oz)-96.163 kg (212 lb)] 94.031 kg (207 lb 4.8 oz) (09/15 0628) Last BM Date: 08/21/12  Intake/Output from previous day: 09/14 0701 - 09/15 0700 In: 727.3 [I.V.:727.3] Out: 1000 [Urine:1000] Intake/Output this shift:   General appearance: alert, cooperative, appears stated age, no distress Resp: clear to auscultation bilaterally Cardio: irregular rate and rhythm, no murmur, click, rub or gallop GI: soft, non-tender; bowel sounds normal; no masses,  no organomegaly  Lab Results:  Basename 08/23/12 0541 08/22/12 1459 08/22/12 0702 08/20/12 1703  WBC -- -- 9.5 7.2  HGB 8.8* 9.4* 8.8* --  HCT 26.2* 27.4* 25.2* --  PLT -- -- 176 193.0   BMET  Basename 08/20/12 2158  NA 138  K 3.8  CL 105  CO2 25  GLUCOSE 117*  BUN 33*  CREATININE 1.16  CALCIUM 8.4   LFT  Basename 08/20/12 2158  PROT 5.7*  ALBUMIN 3.2*  AST 24  ALT 13  ALKPHOS 32*  BILITOT 0.2*  BILIDIR --  IBILI --   PT/INR  Basename 08/22/12 0702  LABPROT 16.6*  INR 1.32    Medications: I have reviewed the patient's current medications.  Assessment/Plan: 1) Iron deficiency anemia: colonoscopy and enteroscopy have been unrevealing. Capsule will be read tomorrow.   LOS: 3 days   [name] 08/23/2012, 11:37 AM

## 2012-08-23 NOTE — Progress Notes (Signed)
Subjective: Patient w/o chest pain or discomfort. Has not had a BM.  Objective: Lab: Lab Results  Component Value Date   WBC 9.5 08/22/2012   HGB 8.8* 08/23/2012   HCT 26.2* 08/23/2012   MCV 88.4 08/22/2012   PLT 176 08/22/2012   BMET    Component Value Date/Time   NA 138 08/20/2012 2158   K 3.8 08/20/2012 2158   CL 105 08/20/2012 2158   CO2 25 08/20/2012 2158   GLUCOSE 117* 08/20/2012 2158   BUN 33* 08/20/2012 2158   CREATININE 1.16 08/20/2012 2158   CREATININE 1.45* 08/16/2011 1617   CALCIUM 8.4 08/20/2012 2158   GFRNONAA 58* 08/20/2012 2158   GFRAA 68* 08/20/2012 2158   Hgb: 7.0 to 9.5 w/ 2 units PRBC to 8.8 to 9.4 to 8.8  Imaging: Capsule endoscopy pending  Scheduled Meds:   . atorvastatin  20 mg Oral Daily  . fluticasone  1 spray Each Nare Daily  . pantoprazole  40 mg Oral Q0600  . sodium chloride  3 mL Intravenous Q12H   Continuous Infusions:   . sodium chloride 50 mL/hr at 08/22/12 2134  . DISCONTD: sodium chloride    . DISCONTD: sodium chloride     PRN Meds:.acetaminophen, acetaminophen, fentaNYL, lidocaine, midazolam, nitroGLYCERIN, ondansetron (ZOFRAN) IV, ondansetron, senna-docusate   Physical Exam: Filed Vitals:   08/23/12 0628  BP: 97/59  Pulse: 102  Temp: 98.5 F (36.9 C)  Resp: 16   Gen'l WNWD white man in no distress HEENT- C&S clear Cor - 2+ radial, RRR Pulm - CTAP Abd- soft, BS +      Assessment/Plan: 1. GI bleed - source still not established. 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 Variable Hgb Plan H/H 1600 hrs and 0500 hours  2. Cardiac- stable, no chest pain. Plan D/c/ tele  3. BP remains soft.  [name] [place] [facility] (o) [phone]; (c) [phone] Call-grp - Patsi Sears IM Tele: [phone]  08/23/2012, 11:39 AM

## 2012-08-24 ENCOUNTER — Encounter (HOSPITAL_COMMUNITY): Payer: Self-pay | Admitting: Gastroenterology

## 2012-08-24 LAB — HEMOGLOBIN AND HEMATOCRIT, BLOOD
HCT: 26.9 % — ABNORMAL LOW (ref 39.0–52.0)
Hemoglobin: 9.4 g/dL — ABNORMAL LOW (ref 13.0–17.0)

## 2012-08-24 MED ORDER — FLUTICASONE PROPIONATE 50 MCG/ACT NA SUSP: 1.0000 | Freq: Every day | NASAL | Status: DC

## 2012-08-24 MED ORDER — PANTOPRAZOLE SODIUM 40 MG PO TBEC: 40.0000 mg | DELAYED_RELEASE_TABLET | Freq: Every day | ORAL | Status: DC

## 2012-08-24 NOTE — Progress Notes (Signed)
Discharge summary sent to payer through [facility]  

## 2012-08-24 NOTE — Discharge Summary (Signed)
[name], [name] NO.:  [id number]  MEDICAL RECORD NO.:  [id number]  LOCATION:  1514                         FACILITY:  [facility]  PHYSICIAN:  Rosalyn Gess. Kaely Hollan, MD  DATE OF BIRTH:  Dec 22, 1930  DATE OF ADMISSION:  08/20/2012 DATE OF DISCHARGE:  08/24/2012                              DISCHARGE SUMMARY   ADDENDUM:  DISCHARGE PHYSICAL EXAMINATION:  VITAL SIGNS:  Temperature was 98.8, blood pressure 110/67, pulse 88, respirations 18, O2 sats 93%. GENERAL APPEARANCE:  This is a well-nourished, well-developed, older white man, in no acute distress. HEENT:  Conjunctivae and sclerae were clear. NECK:  Supple. CHEST:  The patient is moving air well with no rales, wheezes, or rhonchi.  No increased work of breathing. CARDIOVASCULAR:  2+ radial pulses.  Precordium is quiet.  He had an irregular heart rate that was rate controlled.  No murmurs appreciated. ABDOMEN:  Soft.  No guarding or rebound.  No organosplenomegaly was noted.  He had positive bowel sounds. GENITALIA and RECTAL:  Deferred. EXTREMITIES:  Without clubbing, cyanosis, or edema. NEUROLOGIC:  Nonfocal.  FINAL LABORATORY DATA:  Hemoglobin 9.4 g, hematocrit 26.9%.  Final chemistry from August 20, 2012, with a sodium of 138, potassium 3.8, chloride 105, CO2 of 25, BUN 33, creatinine 1.16.  Glucose was 117. Liver functions were normal.  Total protein slightly low at 5.7, total bilirubin slightly low at 0.2.  Iron level was slightly low at 33.  Iron saturation was low at 8%.  Folate was greater than 20.  Vitamin B12 was 554.     Rosalyn Gess Seleny Allbright, MD     MEN/MEDQ  D:  08/24/2012  T:  08/24/2012  Job:  [id number]

## 2012-08-24 NOTE — Progress Notes (Signed)
Patient discharge home with friend, alert and oriented, discharge instructions given, patient verbalize understanding of discharge orders given, patient in stable condition at this time

## 2012-08-24 NOTE — Progress Notes (Signed)
[facility] Progress Note  SUBJECTIVE: feels fine. No BMs since Thursday  OBJECTIVE:  Vital signs in last 24 hours: Temp:  [98.8 F (37.1 C)-99.6 F (37.6 C)] 98.8 F (37.1 C) (09/16 0641) Pulse Rate:  [88-96] 88  (09/16 0641) Resp:  [16-18] 18  (09/16 0641) BP: (110-134)/(67-75) 110/67 mmHg (09/16 0641) SpO2:  [93 %-100 %] 93 % (09/16 0641) Last BM Date: 08/21/12 General:    Pleasant white male in NAD Abdomen:  Soft, obese,nontender. Normal bowel sounds. Neurologic:  Alert and oriented,  grossly normal neurologically. Psych:  Cooperative. Normal mood and affect.   Lab Results:  Basename 08/24/12 0539 08/23/12 1355 08/23/12 0541 08/22/12 0702  WBC -- -- -- 9.5  HGB 9.4* 8.6* 8.8* --  HCT 26.9* 24.9* 26.2* --  PLT -- -- -- 176   PT/INR  Basename 08/22/12 0702  LABPROT 16.6*  INR 1.32     ASSESSMENT / PLAN:  1. Obscure GIB on pradaxa. Admitted with black stools, hemoglobin of 7.  Normal small bowel enteroscopy. Normal colonoscopy March of this year. Capsule endo study complete, results pending. No further bleeding. Hemoglobin stable since transfusion 08/21/12. Await capsule endo results.  2. History of AFIB, CAD/CABG. On chronic pradaxa (on hold) and ASA   LOS: 4 days   Willette Cluster  08/24/2012, 8:04 AM   GI ATTENDING  SEEN AND EXAMINED. NO BLEEDING IN DAYS (OFF PRADAXA). CAPSULE STUDY COMPLETE BUT NOT READ. OK TO D/C. GI WILL CONTACT PT THIS WEEK WITH RESULTS AND FURTHER PLANS. THANKS  Shamere Dilworth N. Eda Keys., M.D. [facility] Division of Gastroenterology

## 2012-08-24 NOTE — Progress Notes (Signed)
Subjective: No complaints of chest pain, SOB or abdominal pain. He has not had a BM  Objective: Lab: Lab Results  Component Value Date   WBC 9.5 08/22/2012   HGB 9.4* 08/24/2012   HCT 26.9* 08/24/2012   MCV 88.4 08/22/2012   PLT 176 08/22/2012   BMET    Component Value Date/Time   NA 138 08/20/2012 2158   K 3.8 08/20/2012 2158   CL 105 08/20/2012 2158   CO2 25 08/20/2012 2158   GLUCOSE 117* 08/20/2012 2158   BUN 33* 08/20/2012 2158   CREATININE 1.16 08/20/2012 2158   CREATININE 1.45* 08/16/2011 1617   CALCIUM 8.4 08/20/2012 2158   GFRNONAA 58* 08/20/2012 2158   GFRAA 68* 08/20/2012 2158   Hgb: 7.0 to 9.5 w/ 2 units PRBC to 8.8 to 9.4 to 8.8 to 9.4   Imaging:  Scheduled Meds:   . atorvastatin  20 mg Oral Daily  . fluticasone  1 spray Each Nare Daily  . magnesium hydroxide  30 mL Oral Daily  . pantoprazole  40 mg Oral Q0600  . sodium chloride  3 mL Intravenous Q12H  . vitamin B-12  1,000 mcg Oral Daily   Continuous Infusions:   . sodium chloride 50 mL/hr at 08/24/12 0440   PRN Meds:.acetaminophen, acetaminophen, nitroGLYCERIN, ondansetron (ZOFRAN) IV, ondansetron, senna-docusate, DISCONTD: fentaNYL, DISCONTD: lidocaine, DISCONTD: midazolam   Physical Exam: Filed Vitals:   08/24/12 0641  BP: 110/67  Pulse: 88  Temp: 98.8 F (37.1 C)  Resp: 18  WNWD white man Cor - IRIR rate controlled PUlm - normal respirations Abd - BS+ , soft, no guarding or rebound Neuro - A&O x 3      Assessment/Plan: 1. GI - no sign of further bleeding. Hgb has been stable x 48 hours Capsule endoscopy report pending.  Plan - for d/c if ok with GI  2. Cardiac - stable  3. HTN - BPs low - will hold restart of medication until seen in the office 9/19.  Dictated #[id number]   [name] [place] IM (o) [phone]; (c) [phone] Call-grp - Patsi Sears IM Tele: [phone]  08/24/2012, 6:54 AM

## 2012-08-24 NOTE — Discharge Summary (Signed)
NAMEJAILIN, [name] NO.:  [id number]  MEDICAL RECORD NO.:  [id number]  LOCATION:  1514                         FACILITY:  [facility]  PHYSICIAN:  [name], MD  DATE OF BIRTH:  Oct 05, 1931  DATE OF ADMISSION:  08/20/2012 DATE OF DISCHARGE:  08/24/2012                              DISCHARGE SUMMARY   ADMITTING DIAGNOSES: 1. Gastrointestinal bleed. 2. Hypertension. 3. Atrial fibrillation.  DISCHARGE DIAGNOSES: 1. Gastrointestinal bleed. 2. Hypertension. 3. Atrial fibrillation.  CONSULTANTS:  Dr. Melvia Heaps and Dr. Charna Elizabeth, both for GI procedures.  PROCEDURE: 1. Repeat upper endoscopy that was unremarkable with no sign of GI     bleeding. 2. Capsule endoscopy report pending at Clayton time of discharge     dictation.  HISTORY OF PRESENT ILLNESS:  [name] is a 78 year old gentleman who in mid August 2013, had dark black stool, was seen in [facility] where he was noted to have a drop in hemoglobin.  He was seen in [place] office of [facility] on August 04, 2012, and then following this had an upper endoscopy on August 05, 2012, which was unremarkable except for mild antral gastritis and Candida esophagitis. He was treated for Clayton latter.  He was continued on Diflucan.  [name] presented back to [place] emergency department on August 14, 2012, because of black stools and was noted to have a drop in hemoglobin from 12.4 in August to 10.4.  He was observed overnight and no signs of active GI bleeding.  His hemoglobin remained stable with no drop.  He felt well and was discharged to home.  [name] was seen in followup on Clayton day of admission in Clayton office.  He had been active playing golf and doing his usual activities but did feel somewhat short of breath, weak and complained of very mild chest discomfort.  Hemoglobin now [name] came back at 7 and he was  subsequently admitted for transfusion and further  observation.  Please see Clayton H and P for past medical history, family history, and social history.  HOSPITAL COURSE:  [name] was admitted to a telemetry unit.  He was transfused 3 units of blood with an appropriate rise in hemoglobin at 9.5.  He had no continued problems with dark black stools.  He was seen by GI Service.  He underwent repeat endoscopy with findings as above. [name] then came to capsule endoscopy and report pending at Clayton time of discharge.  [name] had had a colonoscopy in March, 2013 that had been normal.  [name]'s hemoglobin tracked up and down with initial hemoglobin was 7 to 9.5 after 3 units transfusion to 8.8, then to 9.4, and then 8.8, and on Clayton day of discharge 9.4 g.  [name] has not had a bowel movement for greater than 24 hours, but has had no abdominal pain, no sign of active GI bleeding.  [name]'s chest discomfort resolved with transfusion and he had no recurrent discomfort.  With [name] having a stable hemoglobin for 48 hours, with no source of GI bleeding identified, with [name]'s blood pressure being stable although low  and no chest pain at this point, he is ready for discharge to home.  ASSESSMENT AND PLAN: 1. Cardiac [name] in atrial fibrillation.  He had been on Pradaxa     which was stopped at Clayton time of admission.  He did have some mild     chest discomfort in Clayton setting of known CAD, but this resolved     quickly as he was transfused.  He had a 12-lead electrocardiogram     which showed no acute changes.  [name] having no cardiac     symptoms and his atrial fibrillation being rate controlled he is     thought to be stable.  We will hold restarting him on     anticoagulation until [name] has been free of any sign of     bleeding for at least 2 weeks. 2. Hypertension.  [name]'s blood pressures have been low with     systolics in Clayton 90s.  During this hospital stay, he continued to      have mildly depressed blood pressures that were improving.  On Clayton     day of discharge, his blood pressure was 110/67.  He has been as     high as 129/75.  Clayton plan is to continue to hold [name]'s     blood pressure medications until seen in followup in 3-4 days, and     then we will consider restarting his regular regimen. 3. With [name]'s problems being outlined as above and him being     stable, if GI is agreeable, then he will be discharged to home.  He     will have close followup in Clayton office with follow up in 3-4 days     for followup H and H, as well as to reassess his blood pressure,     and consider restarting medications.  CONDITION AT Clayton TIME OF DISCHARGE DICTATION:  Medically stable.     Rosalyn Gess Norins, MD     MEN/MEDQ  D:  08/24/2012  T:  08/24/2012  Job:  [id number]

## 2012-08-27 ENCOUNTER — Encounter: Payer: Self-pay | Admitting: Internal Medicine

## 2012-08-27 ENCOUNTER — Other Ambulatory Visit (INDEPENDENT_AMBULATORY_CARE_PROVIDER_SITE_OTHER): Payer: Medicare Other

## 2012-08-27 ENCOUNTER — Ambulatory Visit (INDEPENDENT_AMBULATORY_CARE_PROVIDER_SITE_OTHER): Payer: Medicare Other | Admitting: Internal Medicine

## 2012-08-27 VITALS — BP 110/58 | HR 86 | Temp 96.4°F | Resp 16 | Wt 213.0 lb

## 2012-08-27 DIAGNOSIS — Z23 Encounter for immunization: Secondary | ICD-10-CM

## 2012-08-27 DIAGNOSIS — D649 Anemia, unspecified: Secondary | ICD-10-CM

## 2012-08-27 DIAGNOSIS — K922 Gastrointestinal hemorrhage, unspecified: Secondary | ICD-10-CM

## 2012-08-27 LAB — CBC WITH DIFFERENTIAL/PLATELET
Basophils Absolute: 0 10*3/uL (ref 0.0–0.1)
Eosinophils Relative: 2.8 % (ref 0.0–5.0)
HCT: 28.4 % — ABNORMAL LOW (ref 39.0–52.0)
Hemoglobin: 9.4 g/dL — ABNORMAL LOW (ref 13.0–17.0)
Lymphocytes Relative: 30 % (ref 12.0–46.0)
Lymphs Abs: 1.8 10*3/uL (ref 0.7–4.0)
Monocytes Relative: 11.3 % (ref 3.0–12.0)
Neutro Abs: 3.3 10*3/uL (ref 1.4–7.7)
RBC: 3.12 Mil/uL — ABNORMAL LOW (ref 4.22–5.81)
WBC: 6 10*3/uL (ref 4.5–10.5)

## 2012-08-27 NOTE — Patient Instructions (Addendum)
Capsule endoscopy revealed inflammation and errosion in the proximal bowel below the duodenum.  Continue protonix, and hold the pradaxa. Blood count today.

## 2012-08-30 NOTE — Assessment & Plan Note (Signed)
Mid-August to late September '13 - black stools with drop in Hgb from 15 to a nadir of 7, required 3 unit transfusion. EDG August 28, '13 - negative, Colonoscopy from March '13 had been negative. EGD Sept 17, '13 was negative. Capsule Endoscopy with inflammation and erosions proximal small bowel. He is currently doing well.   Plan H/H today  Keep appointment with Dr. Jarold Motto  Do not resume Pradaxa until cleared by Dr. Jarold Motto.  Addendum: Hgb 9.4 - no change from Hgb 9/16//13

## 2012-08-30 NOTE — Progress Notes (Signed)
Subjective:    Patient ID: [name], male    DOB: 1931/08/04, 78 y.o.   MRN: [id number]  HPI [name] was recently hospitalized for a GI bleed - records, labs and studies reviewed. He required 3 unit transfusion. He had EGD that was unrevealing. He did have capsule endoscopy - discussed results with Eliott Nine, PA who has just completed reading study: inflammatory changes with small erosions noted in the proximal small bowel. Since discharge [name] has not had any further dark/black stools. He has not had any chest pain or pressure and has been able to do all his usually activities. He has not resumed Pradaxa.  Past Medical History  Diagnosis Date  . Knee pain   . CAD (coronary artery disease)     s/p CABG 1993 (L-LAD, S-RI, S-D1);   echo 1/10: EF 55%;    myoview 12/09: inf MI, no ischemia  . Hyperlipidemia   . HTN (hypertension)   . Folliculitis   . Dyspnea   . Hematuria   . Hematochezia   . Obesity   . Osteoarthritis   . Irritable bladder   . Sleep disorder   . Family history of malignant neoplasm of gastrointestinal tract   . Myocardial infarction   . Candida esophagitis 2013    EGD   . Antral gastritis 2013    EGD   . Hiatal hernia    Past Surgical History  Procedure Date  . Coronary artery bypass graft     graft '92: LIMA-LAD, SVG - D2,R1, D1  . Cholecystectomy     laparoscopic '92  . Mole removal 2001 and 2008  . Pilonidal cyst excision   . Enteroscopy 08/22/2012    Procedure: ENTEROSCOPY;  Surgeon: Charna Elizabeth, MD;  Location: [facility];  Service: Endoscopy;  Laterality: N/A;   Family History  Problem Relation Age of Onset  . Coronary artery disease Father     and brother  . Heart attack Father     and brother-fatal  . Stroke Father   . Breast cancer Mother   . Colon cancer Maternal Uncle   . Esophageal cancer Neg Hx   . Stomach cancer Neg Hx   . Rectal cancer Neg Hx    History   Social History  . Marital Status: Widowed    Spouse Name: N/A   Number of Children: 1  . Years of Education: N/A   Occupational History  . Retired    Social History Main Topics  . Smoking status: Former Smoker -- 16 years    Quit date: 08/14/1964  . Smokeless tobacco: Never Used   Comment: quit 1965  . Alcohol Use: No  . Drug Use: No  . Sexually Active: Not on file   Other Topics Concern  . 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 Married '57- widowed 05/06/23. 1 son -'4- died '13-May-2023 MVA. Retired: keeps himself busy. ACP - not reviewed (Jan '13)    Current Outpatient Prescriptions on File Prior to Visit  Medication Sig Dispense Refill  . aspirin EC 81 MG tablet Take 81 mg by mouth daily.      Marland Kitchen atorvastatin (LIPITOR) 20 MG tablet Take 20 mg by mouth daily.      . Calcium Carbonate-Vitamin D (CALCIUM-VITAMIN D) 600-200 MG-UNIT CAPS Take 1 tablet by mouth daily.       . Cholecalciferol (VITAMIN D) 1000 UNITS capsule Take 1,000 Units by mouth daily.        Marland Kitchen  fluticasone (FLONASE) 50 MCG/ACT nasal spray Place 1 spray into the nose daily.  16 g  5  . furosemide (LASIX) 40 MG tablet Take 40 mg by mouth daily.      . Multiple Vitamin (MULTIVITAMIN) capsule Take 1 capsule by mouth daily.        . nitroGLYCERIN (NITROSTAT) 0.4 MG SL tablet Place 0.4 mg under the tongue every 5 (five) minutes as needed.      . pantoprazole (PROTONIX) 40 MG tablet Take 1 tablet (40 mg total) by mouth daily at 6 (six) AM.  30 tablet  11  . Probiotic Product (PROBIOTIC DAILY PO) Take 1 tablet by mouth daily.      . vitamin B-12 (CYANOCOBALAMIN) 1000 MCG tablet Take 1,000 mcg by mouth daily.          Review of Systems System review is negative for any constitutional, cardiac, pulmonary, GI or neuro symptoms or complaints other than as described in the HPI.     Objective:   Physical Exam Filed Vitals:   08/27/12 1545  BP: 110/58  Pulse: 86  Temp: 96.4 F (35.8 C)  Resp: 16   Gen'l- WNWD white man in no distress HEENT - C&S clear, w/o  pallor Cor- IRIR rate controlled Pulm - normal respirations. Abd - BS+, non-tender       Assessment & Plan:

## 2012-09-01 ENCOUNTER — Ambulatory Visit (INDEPENDENT_AMBULATORY_CARE_PROVIDER_SITE_OTHER): Payer: Medicare Other | Admitting: Gastroenterology

## 2012-09-01 ENCOUNTER — Encounter: Payer: Self-pay | Admitting: Gastroenterology

## 2012-09-01 VITALS — BP 110/68 | HR 88 | Ht 70.0 in | Wt 215.6 lb

## 2012-09-01 DIAGNOSIS — D509 Iron deficiency anemia, unspecified: Secondary | ICD-10-CM

## 2012-09-01 DIAGNOSIS — Z7901 Long term (current) use of anticoagulants: Secondary | ICD-10-CM

## 2012-09-01 DIAGNOSIS — T39395A Adverse effect of other nonsteroidal anti-inflammatory drugs [NSAID], initial encounter: Secondary | ICD-10-CM

## 2012-09-01 DIAGNOSIS — T3995XA Adverse effect of unspecified nonopioid analgesic, antipyretic and antirheumatic, initial encounter: Secondary | ICD-10-CM

## 2012-09-01 DIAGNOSIS — E611 Iron deficiency: Secondary | ICD-10-CM

## 2012-09-01 DIAGNOSIS — K469 Unspecified abdominal hernia without obstruction or gangrene: Secondary | ICD-10-CM

## 2012-09-01 DIAGNOSIS — K921 Melena: Secondary | ICD-10-CM

## 2012-09-01 MED ORDER — DOCUSATE SODIUM 100 MG PO CAPS: 100.0000 mg | ORAL_CAPSULE | Freq: Every day | ORAL | Status: DC

## 2012-09-01 MED ORDER — TRAMADOL HCL 50 MG PO TABS: 50.0000 mg | ORAL_TABLET | Freq: Four times a day (QID) | ORAL | Status: DC | PRN

## 2012-09-01 NOTE — Progress Notes (Signed)
This is a confusing 78 year old Caucasian male who has had history of recurrent melena with otherwise negative GI evaluations except for endoscopy this summer which showed severe candida esophagitis. He was recently readmitted with melena and anemia by Dr.Mann . He underwent a negative endoscopy-enteroscopy exam, and had pill camera exam which suggested some small bowel erosions. Previous colonoscopy is been unremarkable. He does admit to taking NSAIDs frequently because of golf pains and aches, and also is been on aspirin and Pradaxa of per his previous coronary bypass surgery. He has been off of his Pradaxa now for several weeks, and denies melena, or any GI symptomatology. He does take Protonix 40 mg a day, probiotics, and oral B12 supplements. The patient denies current cardiovascular pulmonary complaints, and has started playing golf again.  Current Medications, Allergies, Past Medical History, Past Surgical History, Family History and Social History were reviewed in [employer] record.  Pertinent Review of Systems Negative   Physical Exam: Healthy-appearing patient appear mature than his stated age. Blood pressure 110/68, pulse 88, and weight 215 pounds with a BMI of 30.94. Abdominal exam shows no organomegaly, but he does have a rather prominent ventral hernia. There no areas of abdominal masses or tenderness. Bowel sounds are normal. Inspection the rectum is unremarkable as is rectal exam. There is normal color stool present which is guaiac negative.    Assessment and Plan: Recurrent GI bleeding secondary to NSAID use in addition to daily aspirin and Pradaxa. Extensive workup shows of these lesions appear to be in the small intestine when they occur. I have strongly, strongly reiterated to this patient that he should avoid all NSAIDs. I have given him Ultram 50 mg to use every 6-8 hours for myalgias and arthralgias. He currently is constipated and I have added Colace 100 mg  a day to his regime. He will continue aspirin as per cardiology, daily PPI therapy, and avoidance of NSAIDs. I have ordered followup CBC and iron studies in one month's time. No diagnosis found.

## 2012-09-01 NOTE — Telephone Encounter (Signed)
Pt will see Dr Jarold Motto today.

## 2012-09-01 NOTE — Patient Instructions (Addendum)
Please restart your Pradaxa as previously ordered You will need lab work in 1 month.  We will give you a reminder call Please continue your protonix Start on Colace 100 mg daily, Tramadol 50 mg 1 every 6-8 hours for pain, - both prescriptions have been sent to your pharmacy Dr. Jarold Motto asks that you avoid all NSAIDS

## 2012-09-02 ENCOUNTER — Encounter: Payer: Self-pay | Admitting: Internal Medicine

## 2012-09-11 ENCOUNTER — Telehealth: Payer: Self-pay | Admitting: Cardiology

## 2012-09-11 NOTE — Telephone Encounter (Signed)
plz return call to pt [phone] regarding medication change

## 2012-09-11 NOTE — Telephone Encounter (Signed)
I spoke with the pt and he would like to know if he can be switched from Pradaxa to Xarelto.  The pt said his brother takes this medication and it is cheaper.  I made the pt aware that the cost of medication is based on insurance.  I have scheduled the pt to come into the office to discuss his anticoagulation with Dr Riley Kill.

## 2012-09-30 ENCOUNTER — Ambulatory Visit (INDEPENDENT_AMBULATORY_CARE_PROVIDER_SITE_OTHER): Payer: Medicare Other | Admitting: Cardiology

## 2012-09-30 ENCOUNTER — Encounter: Payer: Self-pay | Admitting: Cardiology

## 2012-09-30 VITALS — BP 132/70 | HR 76 | Ht 70.5 in | Wt 219.8 lb

## 2012-09-30 DIAGNOSIS — I2510 Atherosclerotic heart disease of native coronary artery without angina pectoris: Secondary | ICD-10-CM

## 2012-09-30 DIAGNOSIS — K922 Gastrointestinal hemorrhage, unspecified: Secondary | ICD-10-CM

## 2012-09-30 DIAGNOSIS — I10 Essential (primary) hypertension: Secondary | ICD-10-CM

## 2012-09-30 DIAGNOSIS — I4891 Unspecified atrial fibrillation: Secondary | ICD-10-CM

## 2012-09-30 MED ORDER — RIVAROXABAN 20 MG PO TABS: 20.0000 mg | ORAL_TABLET | Freq: Every day | ORAL | Status: DC

## 2012-09-30 NOTE — Assessment & Plan Note (Signed)
The patient is been well worked up. We will recheck a CBC today. We'll be switching him from per DACs up to 0 out so. He does have risk of thromboembolic events, so it makes sense to try to treat him, I think is appropriate for him not to be on aspirin or nonsteroidals at the present time. We will see him back in followup in one month, and I will also repeat a CBC at that time.

## 2012-09-30 NOTE — Assessment & Plan Note (Signed)
The patient is doing well from the standpoint of his atrial fibrillation. He would like to switch off his current anticoagulant, and his desire is to switch to  Xarelto.  His Cr clearance is adequate.  He is off ASA, and NSAIDS.  We will check his CBC today and we will transition for this.

## 2012-09-30 NOTE — Assessment & Plan Note (Signed)
Is currently controlled

## 2012-09-30 NOTE — Patient Instructions (Addendum)
Your physician recommends that you have lab work today: BMP and CBC  Your physician has recommended you make the following change in your medication: STOP Pradaxa now, START Xarelto 20mg  on Friday take one by mouth daily with evening meal  Your physician recommends that you schedule a follow-up appointment in: 4 WEEKS with Dr Riley Kill

## 2012-09-30 NOTE — Progress Notes (Signed)
HPI:  The patient returns today in a followup visit. From a cardiac standpoint he is been stable. However he was admitted to the hospital with gastrointestinal bleed. And this demonstrated some gastritis. He had irritation also noted in the intestine. Importantly, the patient was on Pradaxa. In addition to that he was on aspirin, and was also on a nonsteroidal intermittent. He now returns today in followup he is actually doing pretty well he was given to go ahead and restart his anticoagulation therapy. He inquired today about switching off of Pradaxa.  He works out at [employer] three days per week, and also plays golf three days per week.  Otherwise he remains stable.  No current symptoms.    Current Outpatient Prescriptions  Medication Sig Dispense Refill  . atorvastatin (LIPITOR) 20 MG tablet Take 20 mg by mouth daily.      . Calcium Carbonate-Vitamin D (CALCIUM-VITAMIN D) 600-200 MG-UNIT CAPS Take 1 tablet by mouth daily.       . Cholecalciferol (VITAMIN D) 1000 UNITS capsule Take 1,000 Units by mouth daily.        Marland Kitchen docusate sodium (COLACE) 100 MG capsule Take 100 mg by mouth as needed.      . fluticasone (FLONASE) 50 MCG/ACT nasal spray Place 1 spray into the nose as needed.      . furosemide (LASIX) 40 MG tablet Take 40 mg by mouth daily.      . metoprolol succinate (TOPROL-XL) 25 MG 24 hr tablet Take 25 mg by mouth daily.      . Multiple Vitamin (MULTIVITAMIN) capsule Take 1 capsule by mouth daily.        . nitroGLYCERIN (NITROSTAT) 0.4 MG SL tablet Place 0.4 mg under the tongue every 5 (five) minutes as needed.      . pantoprazole (PROTONIX) 40 MG tablet Take 1 tablet (40 mg total) by mouth daily at 6 (six) AM.  30 tablet  11  . Probiotic Product (PROBIOTIC DAILY PO) Take 1 tablet by mouth daily.      . traMADol (ULTRAM) 50 MG tablet Take 1 tablet (50 mg total) by mouth every 6 (six) hours as needed for pain.  30 tablet  6  . vitamin B-12 (CYANOCOBALAMIN) 1000 MCG tablet Take 1,000 mcg by  mouth daily.      Marland Kitchen DISCONTD: fluticasone (FLONASE) 50 MCG/ACT nasal spray Place 1 spray into the nose daily.  16 g  5  . Rivaroxaban (XARELTO) 20 MG TABS Take 1 tablet (20 mg total) by mouth daily with supper.  90 tablet  3    No Known Allergies  Past Medical History  Diagnosis Date  . Knee pain   . CAD (coronary artery disease)     s/p CABG 1993 (L-LAD, S-RI, S-D1);   echo 1/10: EF 55%;    myoview 12/09: inf MI, no ischemia  . Hyperlipidemia   . HTN (hypertension)   . Folliculitis   . Dyspnea   . Hematuria   . Hematochezia   . Obesity   . Osteoarthritis   . Irritable bladder   . Sleep disorder   . Family history of malignant neoplasm of gastrointestinal tract   . Myocardial infarction   . Candida esophagitis 2013    EGD   . Antral gastritis 2013    EGD   . Hiatal hernia     Past Surgical History  Procedure Date  . Coronary artery bypass graft     graft '92: LIMA-LAD, SVG - D2,R1,  D1  . Cholecystectomy     laparoscopic '92  . Mole removal 2001 and 2008  . Pilonidal cyst excision   . Enteroscopy 08/22/2012    Procedure: ENTEROSCOPY;  Surgeon: Charna Elizabeth, MD;  Location: [facility];  Service: Endoscopy;  Laterality: N/A;    Family History  Problem Relation Age of Onset  . Coronary artery disease Father     and brother  . Heart attack Father     and brother-fatal  . Stroke Father   . Breast cancer Mother   . Colon cancer Maternal Uncle   . Esophageal cancer Neg Hx   . Stomach cancer Neg Hx   . Rectal cancer Neg Hx     History   Social History  . Marital Status: Widowed    Spouse Name: N/A    Number of Children: 1  . Years of Education: N/A   Occupational History  . Retired    Social History Main Topics  . Smoking status: Former Smoker -- 16 years    Quit date: 08/14/1964  . Smokeless tobacco: Never Used   Comment: quit 1965  . Alcohol Use: No  . Drug Use: No  . Sexually Active: Not on file   Other Topics Concern  . 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 Married '57- widowed 2023-04-11. 1 son -'32- died '04-18-23 MVA. Retired: keeps himself busy. ACP - not reviewed (Jan '13)    ROS: Please see the HPI.  All other systems reviewed and negative.  PHYSICAL EXAM:  BP 132/70  Pulse 76  Ht 5' 10.5" (1.791 m)  Wt 219 lb 12.8 oz (99.701 kg)  BMI 31.09 kg/m2  General: Well developed, well nourished, in no acute distress. Head:  Normocephalic and atraumatic. Neck: no JVD Lungs: Clear to auscultation and percussion. Heart: Normal S1 and S2.  No murmur, rubs or gallops.  Pulses: Pulses normal in all 4 extremities. Extremities: No clubbing or cyanosis. No edema. Neurologic: Alert and oriented x 3.  EKG: Atrial fib with controlled ventricular response.  No ST changes.    ASSESSMENT AND PLAN:

## 2012-10-01 ENCOUNTER — Other Ambulatory Visit: Payer: Self-pay | Admitting: Cardiology

## 2012-10-01 LAB — BASIC METABOLIC PANEL
BUN: 16 mg/dL (ref 6–23)
CO2: 23 mEq/L (ref 19–32)
Calcium: 9.9 mg/dL (ref 8.4–10.5)
Chloride: 125 mEq/L — ABNORMAL HIGH (ref 96–112)
Creat: 1.16 mg/dL (ref 0.50–1.35)
Creatinine, Ser: 1.5 mg/dL (ref 0.4–1.5)
Glucose, Bld: 105 mg/dL — ABNORMAL HIGH (ref 70–99)

## 2012-10-01 LAB — CBC WITH DIFFERENTIAL/PLATELET
Basophils Absolute: 0 10*3/uL (ref 0.0–0.1)
Basophils Relative: 0.6 % (ref 0.0–3.0)
Eosinophils Absolute: 0.1 10*3/uL (ref 0.0–0.7)
Hemoglobin: 10.1 g/dL — ABNORMAL LOW (ref 13.0–17.0)
Lymphocytes Relative: 19.8 % (ref 12.0–46.0)
MCHC: 32.1 g/dL (ref 30.0–36.0)
MCV: 87.6 fl (ref 78.0–100.0)
Monocytes Absolute: 0.5 10*3/uL (ref 0.1–1.0)
Neutro Abs: 4.5 10*3/uL (ref 1.4–7.7)
Neutrophils Relative %: 69.5 % (ref 43.0–77.0)
RBC: 3.59 Mil/uL — ABNORMAL LOW (ref 4.22–5.81)
RDW: 17.3 % — ABNORMAL HIGH (ref 11.5–14.6)

## 2012-10-27 ENCOUNTER — Ambulatory Visit (INDEPENDENT_AMBULATORY_CARE_PROVIDER_SITE_OTHER): Payer: Medicare Other | Admitting: Cardiology

## 2012-10-27 ENCOUNTER — Encounter: Payer: Self-pay | Admitting: Cardiology

## 2012-10-27 VITALS — BP 122/84 | HR 67 | Ht 71.0 in | Wt 219.0 lb

## 2012-10-27 DIAGNOSIS — I4891 Unspecified atrial fibrillation: Secondary | ICD-10-CM

## 2012-10-27 DIAGNOSIS — K922 Gastrointestinal hemorrhage, unspecified: Secondary | ICD-10-CM

## 2012-10-27 DIAGNOSIS — I2510 Atherosclerotic heart disease of native coronary artery without angina pectoris: Secondary | ICD-10-CM

## 2012-10-27 NOTE — Assessment & Plan Note (Signed)
The patient is stable. Even though he cut back his metoprolol dose, his heart rate is well controlled at the current rate of 67. He is also on anticoagulant therapy, and not having any obvious bleeding. We will continue on the current medical regimen, and I will see him back in followup in 2 months. Should he experience any adverse effects, he is to call us promptly.

## 2012-10-27 NOTE — Assessment & Plan Note (Signed)
We'll recheck a CBC today to make sure that he has not developed recurrent GI bleeding.

## 2012-10-27 NOTE — Patient Instructions (Addendum)
Your physician recommends that you schedule a follow-up appointment in: 2 MONTHS with Dr Riley Kill  Your physician recommends that you continue on your current medications as directed. Please refer to the Current Medication list given to you today.  Your physician recommends that you have lab work today: CBC

## 2012-10-27 NOTE — Assessment & Plan Note (Signed)
No recurrent chest pain. Continue medical therapy. 

## 2012-10-27 NOTE — Progress Notes (Signed)
HPI:  The patient is in for followup visit. He is doing really quite well. He is tolerating Xarelto  without any difficulty.  He denies any bleeding, shortness of breath or other major symptoms. He's actually taking half of his furosemide and his metoprolol doses.  Current Outpatient Prescriptions  Medication Sig Dispense Refill  . atorvastatin (LIPITOR) 20 MG tablet Take 20 mg by mouth daily.      . Cholecalciferol (VITAMIN D) 1000 UNITS capsule Take 1,000 Units by mouth daily.        . furosemide (LASIX) 40 MG tablet Take 40 mg by mouth daily.      . metoprolol succinate (TOPROL-XL) 25 MG 24 hr tablet Take 25 mg by mouth daily.      . Multiple Vitamin (MULTIVITAMIN) capsule Take 1 capsule by mouth daily.        . nitroGLYCERIN (NITROSTAT) 0.4 MG SL tablet Place 0.4 mg under the tongue every 5 (five) minutes as needed.      . pantoprazole (PROTONIX) 40 MG tablet Take 1 tablet (40 mg total) by mouth daily at 6 (six) AM.  30 tablet  11  . Probiotic Product (PROBIOTIC DAILY PO) Take 1 tablet by mouth daily.      . Rivaroxaban (XARELTO) 20 MG TABS Take 1 tablet (20 mg total) by mouth daily with supper.  90 tablet  3  . traMADol (ULTRAM) 50 MG tablet Take 1 tablet (50 mg total) by mouth every 6 (six) hours as needed for pain.  30 tablet  6  . vitamin B-12 (CYANOCOBALAMIN) 1000 MCG tablet Take 1,000 mcg by mouth daily.        No Known Allergies  Past Medical History  Diagnosis Date  . Knee pain   . CAD (coronary artery disease)     s/p CABG 1993 (L-LAD, S-RI, S-D1);   echo 1/10: EF 55%;    myoview 12/09: inf MI, no ischemia  . Hyperlipidemia   . HTN (hypertension)   . Folliculitis   . Dyspnea   . Hematuria   . Hematochezia   . Obesity   . Osteoarthritis   . Irritable bladder   . Sleep disorder   . Family history of malignant neoplasm of gastrointestinal tract   . Myocardial infarction   . Candida esophagitis 2013    EGD   . Antral gastritis 2013    EGD   . Hiatal hernia      Past Surgical History  Procedure Date  . Coronary artery bypass graft     graft '92: LIMA-LAD, SVG - D2,R1, D1  . Cholecystectomy     laparoscopic '92  . Mole removal 2001 and 2008  . Pilonidal cyst excision   . Enteroscopy 08/22/2012    Procedure: ENTEROSCOPY;  Surgeon: Charna Elizabeth, MD;  Location: [facility];  Service: Endoscopy;  Laterality: N/A;    Family History  Problem Relation Age of Onset  . Coronary artery disease Father     and brother  . Heart attack Father     and brother-fatal  . Stroke Father   . Breast cancer Mother   . Colon cancer Maternal Uncle   . Esophageal cancer Neg Hx   . Stomach cancer Neg Hx   . Rectal cancer Neg Hx     History   Social History  . Marital Status: Widowed    Spouse Name: N/A    Number of Children: 1  . Years of Education: N/A   Occupational History  .  Retired    Social History Main Topics  . Smoking status: Former Smoker -- 16 years    Quit date: 08/14/1964  . Smokeless tobacco: Never Used     Comment: quit 1965  . Alcohol Use: No  . Drug Use: No  . Sexually Active: Not on file   Other Topics Concern  . 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 Married '57- widowed 04-22-2023. 1 son -'79- died '04-29-23 MVA. Retired: keeps himself busy. ACP - not reviewed (Jan '13)    ROS: Please see the HPI.  All other systems reviewed and negative.  PHYSICAL EXAM:  BP 122/84  Pulse 67  Ht 5\' 11"  (1.803 m)  Wt 219 lb (99.338 kg)  BMI 30.54 kg/m2  SpO2 98%  General: Well developed, well nourished, in no acute distress. Head:  Normocephalic and atraumatic. Neck: no JVD Lungs: Clear to auscultation and percussion. Heart:  Irregularly irregular and without murmur.   Pulses: Pulses normal in all 4 extremities. Extremities: No clubbing or cyanosis. No edema. Neurologic: Alert and oriented x 3.  EKG:  Atrial fib with controlled ventricular response.  No acute changes.    ASSESSMENT AND PLAN:

## 2012-10-28 LAB — CBC WITH DIFFERENTIAL/PLATELET
Basophils Relative: 0.2 % (ref 0.0–3.0)
Eosinophils Relative: 1.7 % (ref 0.0–5.0)
Hemoglobin: 12.1 g/dL — ABNORMAL LOW (ref 13.0–17.0)
Lymphocytes Relative: 23 % (ref 12.0–46.0)
Monocytes Relative: 10.7 % (ref 3.0–12.0)
Neutro Abs: 5 10*3/uL (ref 1.4–7.7)
Neutrophils Relative %: 64.4 % (ref 43.0–77.0)
RBC: 4.35 Mil/uL (ref 4.22–5.81)
WBC: 7.8 10*3/uL (ref 4.5–10.5)

## 2012-10-29 ENCOUNTER — Other Ambulatory Visit: Payer: Self-pay

## 2012-10-29 DIAGNOSIS — I4891 Unspecified atrial fibrillation: Secondary | ICD-10-CM

## 2012-11-12 ENCOUNTER — Other Ambulatory Visit: Payer: Self-pay

## 2012-11-12 ENCOUNTER — Other Ambulatory Visit (INDEPENDENT_AMBULATORY_CARE_PROVIDER_SITE_OTHER): Payer: Medicare Other

## 2012-11-12 DIAGNOSIS — I4891 Unspecified atrial fibrillation: Secondary | ICD-10-CM

## 2012-11-12 LAB — CBC WITH DIFFERENTIAL/PLATELET
Basophils Relative: 0.4 % (ref 0.0–3.0)
Eosinophils Absolute: 0.2 10*3/uL (ref 0.0–0.7)
HCT: 41.4 % (ref 39.0–52.0)
Hemoglobin: 13.4 g/dL (ref 13.0–17.0)
Lymphocytes Relative: 36.9 % (ref 12.0–46.0)
Lymphs Abs: 2.3 10*3/uL (ref 0.7–4.0)
MCHC: 32.3 g/dL (ref 30.0–36.0)
MCV: 87.4 fl (ref 78.0–100.0)
Monocytes Absolute: 0.8 10*3/uL (ref 0.1–1.0)
Neutro Abs: 3 10*3/uL (ref 1.4–7.7)
RBC: 4.73 Mil/uL (ref 4.22–5.81)
RDW: 18.4 % — ABNORMAL HIGH (ref 11.5–14.6)

## 2012-11-12 MED ORDER — ATORVASTATIN CALCIUM 20 MG PO TABS: 20.0000 mg | ORAL_TABLET | Freq: Every day | ORAL | Status: DC

## 2012-11-26 ENCOUNTER — Other Ambulatory Visit: Payer: Medicare Other

## 2012-11-30 ENCOUNTER — Other Ambulatory Visit: Payer: Self-pay

## 2012-11-30 MED ORDER — NITROGLYCERIN 0.4 MG SL SUBL: 0.4000 mg | SUBLINGUAL_TABLET | SUBLINGUAL | Status: DC | PRN

## 2012-12-28 ENCOUNTER — Encounter: Payer: Self-pay | Admitting: Cardiology

## 2012-12-28 ENCOUNTER — Ambulatory Visit (INDEPENDENT_AMBULATORY_CARE_PROVIDER_SITE_OTHER): Payer: Medicare Other | Admitting: Cardiology

## 2012-12-28 VITALS — BP 119/70 | HR 78 | Ht 70.0 in | Wt 220.0 lb

## 2012-12-28 DIAGNOSIS — K922 Gastrointestinal hemorrhage, unspecified: Secondary | ICD-10-CM

## 2012-12-28 DIAGNOSIS — I10 Essential (primary) hypertension: Secondary | ICD-10-CM

## 2012-12-28 DIAGNOSIS — I4891 Unspecified atrial fibrillation: Secondary | ICD-10-CM

## 2012-12-28 DIAGNOSIS — I25810 Atherosclerosis of coronary artery bypass graft(s) without angina pectoris: Secondary | ICD-10-CM

## 2012-12-28 NOTE — Patient Instructions (Addendum)
Your physician recommends that you schedule a follow-up appointment in: 6 WEEKS with Dr Riley Kill  Your physician recommends that you continue on your current medications as directed. Please refer to the Current Medication list given to you today.  Your physician recommends that you have lab work today: CBC

## 2012-12-28 NOTE — Assessment & Plan Note (Signed)
Last hgb stable.  .Will recheck CBC

## 2012-12-28 NOTE — Progress Notes (Signed)
HPI:  He is in for followup. He really is doing well. Today and didn't have any problems. His last hemoglobin was stable. He remains on oral anticoagulant, and is not having any difficulty at the present time. He's not short of breath or having any significant chest pain.  Current Outpatient Prescriptions  Medication Sig Dispense Refill  . atorvastatin (LIPITOR) 20 MG tablet Take 10 mg by mouth daily.      . benazepril (LOTENSIN) 40 MG tablet Take 1 tablet once a day      . Cholecalciferol (VITAMIN D) 1000 UNITS capsule Take 1,000 Units by mouth daily.        . furosemide (LASIX) 40 MG tablet Take 0.5 tablets (20 mg total) by mouth daily.  1 tablet    . metoprolol succinate (TOPROL-XL) 25 MG 24 hr tablet Take 0.5 tablets (12.5 mg total) by mouth daily.      . Multiple Vitamin (MULTIVITAMIN) capsule Take 1 capsule by mouth daily.        . nitroGLYCERIN (NITROSTAT) 0.4 MG SL tablet Place 1 tablet (0.4 mg total) under the tongue every 5 (five) minutes as needed.  25 tablet  1  . pantoprazole (PROTONIX) 40 MG tablet Take 1 tablet (40 mg total) by mouth daily at 6 (six) AM.  30 tablet  11  . Probiotic Product (PROBIOTIC DAILY PO) Take 1 tablet by mouth daily.      . Rivaroxaban (XARELTO) 20 MG TABS Take 1 tablet (20 mg total) by mouth daily with supper.  90 tablet  3  . traMADol (ULTRAM) 50 MG tablet Take 1 tablet (50 mg total) by mouth every 6 (six) hours as needed for pain.  30 tablet  6  . vitamin B-12 (CYANOCOBALAMIN) 1000 MCG tablet Take 1,000 mcg by mouth daily.        No Known Allergies  Past Medical History  Diagnosis Date  . Knee pain   . CAD (coronary artery disease)     s/p CABG 1993 (L-LAD, S-RI, S-D1);   echo 1/10: EF 55%;    myoview 12/09: inf MI, no ischemia  . Hyperlipidemia   . HTN (hypertension)   . Folliculitis   . Dyspnea   . Hematuria   . Hematochezia   . Obesity   . Osteoarthritis   . Irritable bladder   . Sleep disorder   . Family history of malignant neoplasm  of gastrointestinal tract   . Myocardial infarction   . Candida esophagitis 2013    EGD   . Antral gastritis 2013    EGD   . Hiatal hernia     Past Surgical History  Procedure Date  . Coronary artery bypass graft     graft '92: LIMA-LAD, SVG - D2,R1, D1  . Cholecystectomy     laparoscopic '92  . Mole removal 2001 and 2008  . Pilonidal cyst excision   . Enteroscopy 08/22/2012    Procedure: ENTEROSCOPY;  Surgeon: Charna Elizabeth, MD;  Location: [facility];  Service: Endoscopy;  Laterality: N/A;    Family History  Problem Relation Age of Onset  . Coronary artery disease Father     and brother  . Heart attack Father     and brother-fatal  . Stroke Father   . Breast cancer Mother   . Colon cancer Maternal Uncle   . Esophageal cancer Neg Hx   . Stomach cancer Neg Hx   . Rectal cancer Neg Hx     History   Social  History  . Marital Status: Widowed    Spouse Name: N/A    Number of Children: 1  . Years of Education: N/A   Occupational History  . Retired    Social History Main Topics  . Smoking status: Former Smoker -- 16 years    Quit date: 08/14/1964  . Smokeless tobacco: Never Used     Comment: quit 1965  . Alcohol Use: No  . Drug Use: No  . Sexually Active: Not on file   Other Topics Concern  . 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 Married '57- widowed 06-May-2023. 1 son -'31- died '05/13/23 MVA. Retired: keeps himself busy. ACP - not reviewed (Jan '13)    ROS: Please see the HPI.  All other systems reviewed and negative.  PHYSICAL EXAM:  BP 119/70  Pulse 78  Ht 5\' 10"  (1.778 m)  Wt 220 lb (99.791 kg)  BMI 31.57 kg/m2  SpO2 94%  General: Well developed, well nourished, in no acute distress. Head:  Normocephalic and atraumatic. Neck: no JVD Lungs: Clear to auscultation and percussion. Heart: Normal S1 and irregularly irregular rhythm.  No def murmur.   Abdomen:  Normal bowel sounds; soft; non tender; no organomegaly Pulses: Pulses normal  in all 4 extremities. Extremities: No clubbing or cyanosis. No edema. Neurologic: Alert and oriented x 3.  EKG:  Atrial fibrillation  ASSESSMENT AND PLAN:

## 2012-12-28 NOTE — Assessment & Plan Note (Signed)
Stable

## 2012-12-28 NOTE — Assessment & Plan Note (Signed)
Controlled and no obvious bleeding.  Continue current regimen.  Recheck CBC in 6 weeks.

## 2012-12-29 LAB — CBC WITH DIFFERENTIAL/PLATELET
Basophils Relative: 0.3 % (ref 0.0–3.0)
Eosinophils Relative: 4.2 % (ref 0.0–5.0)
HCT: 45.7 % (ref 39.0–52.0)
Lymphs Abs: 1.9 10*3/uL (ref 0.7–4.0)
MCHC: 33.6 g/dL (ref 30.0–36.0)
MCV: 87.9 fl (ref 78.0–100.0)
Monocytes Absolute: 0.9 10*3/uL (ref 0.1–1.0)
Platelets: 224 10*3/uL (ref 150.0–400.0)
WBC: 8.9 10*3/uL (ref 4.5–10.5)

## 2013-01-12 ENCOUNTER — Other Ambulatory Visit: Payer: Self-pay | Admitting: Internal Medicine

## 2013-01-20 ENCOUNTER — Other Ambulatory Visit: Payer: Self-pay | Admitting: *Deleted

## 2013-01-20 DIAGNOSIS — I4891 Unspecified atrial fibrillation: Secondary | ICD-10-CM

## 2013-01-20 MED ORDER — METOPROLOL SUCCINATE ER 25 MG PO TB24: 12.5000 mg | ORAL_TABLET | Freq: Every day | ORAL | Status: DC

## 2013-02-08 ENCOUNTER — Encounter: Payer: Self-pay | Admitting: Cardiology

## 2013-02-08 ENCOUNTER — Ambulatory Visit (INDEPENDENT_AMBULATORY_CARE_PROVIDER_SITE_OTHER): Payer: Medicare Other | Admitting: Cardiology

## 2013-02-08 VITALS — BP 130/80 | HR 62 | Ht 70.0 in | Wt 219.0 lb

## 2013-02-08 DIAGNOSIS — I25810 Atherosclerosis of coronary artery bypass graft(s) without angina pectoris: Secondary | ICD-10-CM

## 2013-02-08 DIAGNOSIS — I2510 Atherosclerotic heart disease of native coronary artery without angina pectoris: Secondary | ICD-10-CM

## 2013-02-08 DIAGNOSIS — I4891 Unspecified atrial fibrillation: Secondary | ICD-10-CM

## 2013-02-08 DIAGNOSIS — E785 Hyperlipidemia, unspecified: Secondary | ICD-10-CM

## 2013-02-08 NOTE — Assessment & Plan Note (Signed)
Modest triglyceride elevation, but stable.  Takes atorvastatin.

## 2013-02-08 NOTE — Patient Instructions (Addendum)
Your physician recommends that you schedule a follow-up appointment in: 3 MONTHS with Dr Jens Som in the [place] office (previous pt of Dr Riley Kill)  Your physician recommends that you continue on your current medications as directed. Please refer to the Current Medication list given to you today.

## 2013-02-08 NOTE — Assessment & Plan Note (Signed)
She is stable at this point in time.  Rate is currently controlled.  Remains on xarelto.

## 2013-02-08 NOTE — Assessment & Plan Note (Signed)
He has no major limitations at present.  He has a modestly abnormal nuc scan, but no current symptoms.  He is opted for medical approach.  Will continue this for now.  TS

## 2013-02-08 NOTE — Progress Notes (Signed)
HPI:  He is doing very well.  No specific symptoms.  Tolerating his medication well without issues.  No evidence of bleeding, and last CBC was normal.  Continues on medical therapy.     Current Outpatient Prescriptions  Medication Sig Dispense Refill  . atorvastatin (LIPITOR) 20 MG tablet Take 10 mg by mouth daily.      . benazepril (LOTENSIN) 40 MG tablet TAKE 1 TABLET DAILY  30 tablet  0  . Cholecalciferol (VITAMIN D) 1000 UNITS capsule Take 1,000 Units by mouth daily.        . furosemide (LASIX) 40 MG tablet Take 0.5 tablets (20 mg total) by mouth daily.  1 tablet    . metoprolol succinate (TOPROL-XL) 25 MG 24 hr tablet Take 0.5 tablets (12.5 mg total) by mouth daily.  30 tablet  6  . Multiple Vitamin (MULTIVITAMIN) capsule Take 1 capsule by mouth daily.        . nitroGLYCERIN (NITROSTAT) 0.4 MG SL tablet Place 1 tablet (0.4 mg total) under the tongue every 5 (five) minutes as needed.  25 tablet  1  . pantoprazole (PROTONIX) 40 MG tablet Take 1 tablet (40 mg total) by mouth daily at 6 (six) AM.  30 tablet  11  . Probiotic Product (PROBIOTIC DAILY PO) Take 1 tablet by mouth daily.      . Rivaroxaban (XARELTO) 20 MG TABS Take 1 tablet (20 mg total) by mouth daily with supper.  90 tablet  3  . traMADol (ULTRAM) 50 MG tablet Take 1 tablet (50 mg total) by mouth every 6 (six) hours as needed for pain.  30 tablet  6  . vitamin B-12 (CYANOCOBALAMIN) 1000 MCG tablet Take 1,000 mcg by mouth daily.       No current facility-administered medications for this visit.    No Known Allergies  Past Medical History  Diagnosis Date  . Knee pain   . CAD (coronary artery disease)     s/p CABG 1993 (L-LAD, S-RI, S-D1);   echo 1/10: EF 55%;    myoview 12/09: inf MI, no ischemia  . Hyperlipidemia   . HTN (hypertension)   . Folliculitis   . Dyspnea   . Hematuria   . Hematochezia   . Obesity   . Osteoarthritis   . Irritable bladder   . Sleep disorder   . Family history of malignant neoplasm of  gastrointestinal tract   . Myocardial infarction   . Candida esophagitis 2013    EGD   . Antral gastritis 2013    EGD   . Hiatal hernia     Past Surgical History  Procedure Laterality Date  . Coronary artery bypass graft      graft '92: LIMA-LAD, SVG - D2,R1, D1  . Cholecystectomy      laparoscopic '92  . Mole removal  2001 and 2008  . Pilonidal cyst excision    . Enteroscopy  08/22/2012    Procedure: ENTEROSCOPY;  Surgeon: Charna Elizabeth, MD;  Location: [facility];  Service: Endoscopy;  Laterality: N/A;    Family History  Problem Relation Age of Onset  . Coronary artery disease Father     and brother  . Heart attack Father     and brother-fatal  . Stroke Father   . Breast cancer Mother   . Colon cancer Maternal Uncle   . Esophageal cancer Neg Hx   . Stomach cancer Neg Hx   . Rectal cancer Neg Hx     History  Social History  . Marital Status: Widowed    Spouse Name: N/A    Number of Children: 1  . Years of Education: N/A   Occupational History  . Retired    Social History Main Topics  . Smoking status: Former Smoker -- 16 years    Quit date: 08/14/1964  . Smokeless tobacco: Never Used     Comment: quit 1965  . Alcohol Use: No  . Drug Use: No  . Sexually Active: Not on file   Other Topics Concern  . 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 Married '57- widowed 04-13-23. 1 son -'68- died '2023/04/20 MVA. Retired: keeps himself busy. ACP - not reviewed (Jan '13)    ROS: Please see the HPI.  All other systems reviewed and negative.  PHYSICAL EXAM:  BP 130/80  Pulse 62  Ht 5\' 10"  (1.778 m)  Wt 219 lb (99.338 kg)  BMI 31.42 kg/m2  SpO2 96%  General: Well developed, well nourished, in no acute distress. Head:  Normocephalic and atraumatic. Neck: no JVD Lungs: Clear to auscultation and percussion. Heart: Normal S1 and S2.  Irregularly irregular rhythm.   Pulses: Pulses normal in all 4 extremities. Extremities: No clubbing or cyanosis. No  edema. Neurologic: Alert and oriented x 3.  EKG:  Atrial fib with controlled ventricular response.  No acute changes.    ASSESSMENT AND PLAN:

## 2013-02-11 ENCOUNTER — Other Ambulatory Visit (INDEPENDENT_AMBULATORY_CARE_PROVIDER_SITE_OTHER): Payer: Medicare Other

## 2013-02-11 ENCOUNTER — Ambulatory Visit (INDEPENDENT_AMBULATORY_CARE_PROVIDER_SITE_OTHER): Payer: Medicare Other | Admitting: Internal Medicine

## 2013-02-11 ENCOUNTER — Encounter: Payer: Self-pay | Admitting: Internal Medicine

## 2013-02-11 VITALS — BP 134/80 | HR 68 | Temp 96.9°F | Resp 12 | Wt 217.0 lb

## 2013-02-11 DIAGNOSIS — I2510 Atherosclerotic heart disease of native coronary artery without angina pectoris: Secondary | ICD-10-CM

## 2013-02-11 DIAGNOSIS — E785 Hyperlipidemia, unspecified: Secondary | ICD-10-CM

## 2013-02-11 DIAGNOSIS — E119 Type 2 diabetes mellitus without complications: Secondary | ICD-10-CM

## 2013-02-11 DIAGNOSIS — Z0000 Encounter for general adult medical examination without abnormal findings: Secondary | ICD-10-CM

## 2013-02-11 DIAGNOSIS — I10 Essential (primary) hypertension: Secondary | ICD-10-CM

## 2013-02-11 DIAGNOSIS — I4891 Unspecified atrial fibrillation: Secondary | ICD-10-CM

## 2013-02-11 LAB — COMPREHENSIVE METABOLIC PANEL
ALT: 27 U/L (ref 0–53)
Alkaline Phosphatase: 57 U/L (ref 39–117)
Creatinine, Ser: 1.4 mg/dL (ref 0.4–1.5)
GFR: 54.15 mL/min — ABNORMAL LOW (ref >60.00)
Glucose, Bld: 115 mg/dL — ABNORMAL HIGH (ref 70–99)
Sodium: 139 mEq/L (ref 135–145)
Total Bilirubin: 1 mg/dL (ref 0.3–1.2)
Total Protein: 7.6 g/dL (ref 6.0–8.3)

## 2013-02-11 LAB — LIPID PANEL
HDL: 33 mg/dL — ABNORMAL LOW (ref >39.00)
Total CHOL/HDL Ratio: 4
Triglycerides: 190 mg/dL — ABNORMAL HIGH (ref 0.0–149.0)
VLDL: 38 mg/dL (ref 0.0–40.0)

## 2013-02-11 LAB — HEPATIC FUNCTION PANEL
ALT: 27 U/L (ref 0–53)
AST: 28 U/L (ref 0–37)
Alkaline Phosphatase: 57 U/L (ref 39–117)
Total Bilirubin: 1 mg/dL (ref 0.3–1.2)

## 2013-02-11 LAB — HEMOGLOBIN A1C: Hgb A1c MFr Bld: 6.4 % (ref 4.6–6.5)

## 2013-02-11 NOTE — Progress Notes (Signed)
Subjective:    Patient ID: [name], male    DOB: [birth year], 79 y.o.   MRN: [id number]  HPI The patient is here for annual Medicare wellness examination and management of other chronic and acute problems.   The risk factors are reflected in the social history.  The roster of all physicians providing medical care to patient - is listed in the Snapshot section of the chart.  Activities of daily living:  The patient is 100% inedpendent in all ADLs: dressing, toileting, feeding as well as independent mobility  Home safety : The patient has smoke detectors in the home. Falls - none in the last 12 months. Home is fall safe except for not having grab bars in the bathroom. They wear seatbelts. firearms are present in the home, kept in a safe fashion. There is no violence in the home.   There is no risks for hepatitis, STDs or HIV. There is no   history of blood transfusion. They have no travel history to infectious disease endemic areas of the world.  The patient has not seen their dentist in the last six month. They have seen their eye doctor in the last year. They deny any hearing difficulty and have not had audiologic testing in the last year.  They do not  have excessive sun exposure. Discussed the need for sun protection: hats, long sleeves and use of sunscreen if there is significant sun exposure.   Diet: the importance of a healthy diet is discussed. They do have a healthy diet.  The patient has a regular exercise program: gym-aerobic/light weights; golf , 60 min minimum duration, 3-6 daysper week.  The benefits of regular aerobic exercise were discussed.  Depression screen: there are no signs or vegative symptoms of depression- irritability, change in appetite, anhedonia, sadness/tearfullness.  Cognitive assessment: the patient manages all their financial and personal affairs and is actively engaged.   The following portions of the patient's history were reviewed and updated as  appropriate: allergies, current medications, past family history, past medical history,  past surgical history, past social history  and problem list.  Past Medical History  Diagnosis Date  . Knee pain   . CAD (coronary artery disease)     s/p CABG 1993 (L-LAD, S-RI, S-D1);   echo 1/10: EF 55%;    myoview 12/09: inf MI, no ischemia  . Hyperlipidemia   . HTN (hypertension)   . Folliculitis   . Dyspnea   . Hematuria   . Hematochezia   . Obesity   . Osteoarthritis   . Irritable bladder   . Sleep disorder   . Family history of malignant neoplasm of gastrointestinal tract   . Myocardial infarction   . Candida esophagitis 2013    EGD   . Antral gastritis 2013    EGD   . Hiatal hernia    Past Surgical History  Procedure Laterality Date  . Coronary artery bypass graft      graft '92: LIMA-LAD, SVG - D2,R1, D1  . Cholecystectomy      laparoscopic '92  . Mole removal  2001 and 2008  . Pilonidal cyst excision    . Enteroscopy  08/22/2012    Procedure: ENTEROSCOPY;  Surgeon: Charna Elizabeth, MD;  Location: [facility];  Service: Endoscopy;  Laterality: N/A;   Family History  Problem Relation Age of Onset  . Coronary artery disease Father     and brother  . Heart attack Father     and brother-fatal  .  Stroke Father   . Breast cancer Mother   . Colon cancer Maternal Uncle   . Esophageal cancer Neg Hx   . Stomach cancer Neg Hx   . Rectal cancer Neg Hx    History   Social History  . Marital Status: Widowed    Spouse Name: N/A    Number of Children: 1  . Years of Education: N/A   Occupational History  . Retired    Social History Main Topics  . Smoking status: Former Smoker -- 16 years    Quit date: 08/14/1964  . Smokeless tobacco: Never Used     Comment: quit 1965  . Alcohol Use: No  . Drug Use: No  . Sexually Active: Not on file   Other Topics Concern  . 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 Married '57- widowed 26-Apr-2023. 1 son -'36- died '05/03/23  MVA. Retired: keeps himself busy. ACP - not reviewed (Jan '13)    Current Outpatient Prescriptions on File Prior to Visit  Medication Sig Dispense Refill  . atorvastatin (LIPITOR) 20 MG tablet Take 10 mg by mouth daily.      . Cholecalciferol (VITAMIN D) 1000 UNITS capsule Take 1,000 Units by mouth daily.        . furosemide (LASIX) 40 MG tablet Take 0.5 tablets (20 mg total) by mouth daily.  1 tablet    . metoprolol succinate (TOPROL-XL) 25 MG 24 hr tablet Take 0.5 tablets (12.5 mg total) by mouth daily.  30 tablet  6  . Multiple Vitamin (MULTIVITAMIN) capsule Take 1 capsule by mouth daily.        . nitroGLYCERIN (NITROSTAT) 0.4 MG SL tablet Place 1 tablet (0.4 mg total) under the tongue every 5 (five) minutes as needed.  25 tablet  1  . pantoprazole (PROTONIX) 40 MG tablet Take 1 tablet (40 mg total) by mouth daily at 6 (six) AM.  30 tablet  11  . Probiotic Product (PROBIOTIC DAILY PO) Take 1 tablet by mouth daily.      . Rivaroxaban (XARELTO) 20 MG TABS Take 1 tablet (20 mg total) by mouth daily with supper.  90 tablet  3  . traMADol (ULTRAM) 50 MG tablet Take 1 tablet (50 mg total) by mouth every 6 (six) hours as needed for pain.  30 tablet  6  . vitamin B-12 (CYANOCOBALAMIN) 1000 MCG tablet Take 1,000 mcg by mouth daily.       No current facility-administered medications on file prior to visit.     Vision, hearing, body mass index were assessed and reviewed.   During the course of the visit the patient was educated and counseled about appropriate screening and preventive services including : fall prevention , diabetes screening, nutrition counseling, colorectal cancer screening, and recommended immunizations.    Review of Systems Constitutional:  Negative for fever, chills, activity change and unexpected weight change. C/o insomnia HEENT:  Negative for hearing loss, ear pain, congestion, neck stiffness and postnasal drip. Negative for sore throat or swallowing problems. Negative for  dental complaints.   Eyes: Negative for vision loss or change in visual acuity.  Respiratory: Negative for chest tightness and wheezing. Negative for DOE.   Cardiovascular: Negative for chest pain, except for minor discomfort in the cold, or palpitations. No decreased exercise tolerance Gastrointestinal: No change in bowel habit. No bloating or gas. No reflux or indigestion Genitourinary: Negative for urgency, flank pain and difficulty urinating. Positive for frequency and  nocturia x 1-2 Musculoskeletal: Negative for myalgias, back pain, arthralgias and gait problem.  Neurological: Negative for dizziness, tremors, weakness and headaches.  Hematological: Negative for adenopathy. Positive for easy bruising  Psychiatric/Behavioral: Negative for behavioral problems and dysphoric mood.       Objective:   Physical Exam Filed Vitals:   02/11/13 0850  BP: 134/80  Pulse: 68  Temp: 96.9 F (36.1 C)  Resp: 12   Wt Readings from Last 3 Encounters:  02/11/13 217 lb (98.431 kg)  02/08/13 219 lb (99.338 kg)  12/28/12 220 lb (99.791 kg)   Gen'l: Well nourished well developed white male in no acute distress  HEENT: Head: Normocephalic and atraumatic. Right Ear: External ear normal. EAC w/ cerumen. Left Ear: External ear normal.  EAC w/ cerumen. Nose: Nose normal. Mouth/Throat: Oropharynx is clear and moist. Dentition - native, in good repair. No buccal or palatal lesions. Posterior pharynx clear. Eyes: Conjunctivae and sclera clear. EOM intact. Pupils are equal, round, and reactive to light. Right eye exhibits no discharge. Left eye exhibits no discharge. Neck: Normal range of motion. Neck supple. No JVD present. No tracheal deviation present. No thyromegaly present.  Cardiovascular: Normal rate, IRIR, no gallop, no friction rub, no murmur heard.      Quiet precordium. 2+ radial and DP pulses . No carotid bruits Pulmonary/Chest: Effort normal. No respiratory distress or increased WOB, no wheezes, no  rales. No chest wall deformity or CVAT. Abdomen: Soft. Bowel sounds are normal in all quadrants. He exhibits no distension, no tenderness, no rebound or guarding, No heptosplenomegaly  Genitourinary:  deferred Musculoskeletal: Normal range of motion. He exhibits no edema and no tenderness.       Small and large joints without redness, synovial thickening or deformity. Full range of motion preserved about all small, median and large joints.  Lymphadenopathy:    He has no cervical or supraclavicular adenopathy.  Neurological: He is alert and oriented to person, place, and time. CN II-XII intact. DTRs 2+ and symmetrical biceps, radial and patellar tendons. Cerebellar function normal with no tremor, rigidity, normal gait and station. Mild decrease vibratory sensation great toe. Skin: Skin is warm and dry. No rash noted. No erythema. Dark, irregular and raised mole anterior scalp, present for some time. Psychiatric: He has a normal mood and affect. His behavior is normal. Thought content normal.   Lab Results  Component Value Date   WBC 8.9 12/28/2012   HGB 15.4 12/28/2012   HCT 45.7 12/28/2012   PLT 224.0 12/28/2012   GLUCOSE 115* 02/11/2013   CHOL 144 02/11/2013   TRIG 190.0* 02/11/2013   HDL 33.00* 02/11/2013   LDLDIRECT 76.8 01/06/2012   LDLCALC 73 02/11/2013        ALT 27 02/11/2013   AST 28 02/11/2013        NA 139 02/11/2013   K 4.5 02/11/2013   CL 103 02/11/2013   CREATININE 1.4 02/11/2013   BUN 22 02/11/2013   CO2 27 02/11/2013   TSH 2.81 03/29/2011   PSA 1.18 04/19/2008   INR 1.32 08/22/2012   HGBA1C 6.4 02/11/2013           Assessment & Plan:

## 2013-02-11 NOTE — Patient Instructions (Addendum)
Thanks for coming in. Everything looks good.  Will send you a full report and you can check you labs on MytChart.  Have the dermatologist look at the lesion on your head  Check with your insurance carrier about coverage for Shinbgles vaccine - Zostavax.

## 2013-02-16 ENCOUNTER — Other Ambulatory Visit: Payer: Self-pay | Admitting: Internal Medicine

## 2013-02-21 NOTE — Assessment & Plan Note (Signed)
Interval history is unremarkable and stable. Physical exam is normal. Lab results reviewed and in normal range. He is current with colorectal cancer screening. He has aged out of prostate cancer screening. Immunizations are current except for shingles vaccine.  In summary - a very nice man who appears to be medically stable in a setting of multiple problems. He will return as needed or in 1 year.

## 2013-02-21 NOTE — Assessment & Plan Note (Signed)
A1C 6.4 % - just at borderline.  Plan Continue low sugar, low carb diet.

## 2013-02-21 NOTE — Assessment & Plan Note (Signed)
BP Readings from Last 3 Encounters:  02/11/13 134/80  02/08/13 130/80  12/28/12 119/70   Good control on present medications. Kidney function and electrolytes are normal  Plan continue present medications

## 2013-02-21 NOTE — Assessment & Plan Note (Signed)
LDL is better than goal of 80 or less in high risk patient. Liver functions are normal  Plan Continue present medications

## 2013-02-21 NOTE — Assessment & Plan Note (Signed)
Stable with good rate control. Totlerating medications as well as [facility] for anti-coagulation  Plan  per Dr. Riley Kill

## 2013-02-21 NOTE — Assessment & Plan Note (Signed)
Stable with excellent risk modification.  Plan Continue all present treatments  Follow up with cardiology as instructed.

## 2013-04-16 ENCOUNTER — Ambulatory Visit (INDEPENDENT_AMBULATORY_CARE_PROVIDER_SITE_OTHER): Payer: Medicare Other | Admitting: Internal Medicine

## 2013-04-16 ENCOUNTER — Encounter: Payer: Self-pay | Admitting: Internal Medicine

## 2013-04-16 VITALS — BP 132/86 | HR 56 | Temp 97.5°F | Resp 12 | Wt 218.0 lb

## 2013-04-16 DIAGNOSIS — I10 Essential (primary) hypertension: Secondary | ICD-10-CM

## 2013-04-16 DIAGNOSIS — I498 Other specified cardiac arrhythmias: Secondary | ICD-10-CM

## 2013-04-16 DIAGNOSIS — E785 Hyperlipidemia, unspecified: Secondary | ICD-10-CM

## 2013-04-16 DIAGNOSIS — I2510 Atherosclerotic heart disease of native coronary artery without angina pectoris: Secondary | ICD-10-CM

## 2013-04-16 MED ORDER — METOPROLOL SUCCINATE ER 25 MG PO TB24: 25.0000 mg | ORAL_TABLET | Freq: Every day | ORAL | Status: DC

## 2013-04-16 NOTE — Assessment & Plan Note (Signed)
Continue with current prescription therapy as reflected on the Med list.  

## 2013-04-16 NOTE — Assessment & Plan Note (Signed)
BP 132/86  Pulse 56  Temp(Src) 97.5 F (36.4 C) (Oral)  Resp 12  Wt 218 lb (98.884 kg)  BMI 31.28 kg/m2  SpO2 95% Doing ok on Toprol 25 mg/d

## 2013-04-16 NOTE — Assessment & Plan Note (Addendum)
Chronic BP spiked high last Monday 5/14 - s/p ER eval Toprol XL dose was increased - doing well NAS diet Check BP/HR at home and record RTC 6 wks Dr Debby Bud

## 2013-04-16 NOTE — Progress Notes (Signed)
  Subjective:    Patient ID: [name], male    DOB: 29-May-1931, 79 y.o.   MRN: [id number]  HPI  He went to [facility] ER last Monday - his BP was 22/12. He was checked out and his Toprol XL 25 mg was increased from 1/2 tab a day to 1 tab/day. His SBP 104-141 lately. Feeling well. The patient presents for a follow-up of  chronic hypertension, chronic dyslipidemia, CAD    Review of Systems  Constitutional: Negative for appetite change, fatigue and unexpected weight change.  HENT: Negative for nosebleeds, congestion, sore throat, sneezing, trouble swallowing and neck pain.   Eyes: Negative for itching and visual disturbance.  Respiratory: Negative for cough.   Cardiovascular: Negative for chest pain, palpitations and leg swelling.  Gastrointestinal: Negative for nausea, diarrhea, blood in stool and abdominal distention.  Genitourinary: Negative for frequency and hematuria.  Musculoskeletal: Negative for back pain, joint swelling and gait problem.  Skin: Negative for rash.  Neurological: Negative for dizziness, tremors, speech difficulty and weakness.  Psychiatric/Behavioral: Negative for sleep disturbance, dysphoric mood and agitation. The patient is not nervous/anxious.     BP Readings from Last 3 Encounters:  04/16/13 132/86  02/11/13 134/80  02/08/13 130/80   Wt Readings from Last 3 Encounters:  04/16/13 218 lb (98.884 kg)  02/11/13 217 lb (98.431 kg)  02/08/13 219 lb (99.338 kg)        Objective:   Physical Exam  Constitutional: He is oriented to person, place, and time. He appears well-developed.  HENT:  Mouth/Throat: Oropharynx is clear and moist.  Eyes: Conjunctivae are normal. Pupils are equal, round, and reactive to light.  Neck: Normal range of motion. No JVD present. No thyromegaly present.  Cardiovascular: Normal rate, regular rhythm, normal heart sounds and intact distal pulses.  Exam reveals no gallop and no friction rub.   No murmur heard. Pulmonary/Chest:  Effort normal and breath sounds normal. No respiratory distress. He has no wheezes. He has no rales. He exhibits no tenderness.  Abdominal: Soft. Bowel sounds are normal. He exhibits no distension and no mass. There is no tenderness. There is no rebound and no guarding.  Musculoskeletal: Normal range of motion. He exhibits no edema and no tenderness.  Lymphadenopathy:    He has no cervical adenopathy.  Neurological: He is alert and oriented to person, place, and time. He has normal reflexes. No cranial nerve deficit. He exhibits normal muscle tone. Coordination normal.  Skin: Skin is warm and dry. No rash noted.  Psychiatric: He has a normal mood and affect. His behavior is normal. Judgment and thought content normal.    Lab Results  Component Value Date   WBC 8.9 12/28/2012   HGB 15.4 12/28/2012   HCT 45.7 12/28/2012   PLT 224.0 12/28/2012   GLUCOSE 115* 02/11/2013   CHOL 144 02/11/2013   TRIG 190.0* 02/11/2013   HDL 33.00* 02/11/2013   LDLDIRECT 76.8 01/06/2012   LDLCALC 73 02/11/2013   ALT 27 02/11/2013   ALT 27 02/11/2013   AST 28 02/11/2013   AST 28 02/11/2013   NA 139 02/11/2013   K 4.5 02/11/2013   CL 103 02/11/2013   CREATININE 1.4 02/11/2013   BUN 22 02/11/2013   CO2 27 02/11/2013   TSH 2.81 03/29/2011   PSA 1.18 04/19/2008   INR 1.32 08/22/2012   HGBA1C 6.4 02/11/2013         Assessment & Plan:

## 2013-04-28 ENCOUNTER — Encounter: Payer: Self-pay | Admitting: Cardiology

## 2013-04-28 ENCOUNTER — Ambulatory Visit (INDEPENDENT_AMBULATORY_CARE_PROVIDER_SITE_OTHER): Payer: Medicare Other | Admitting: Cardiology

## 2013-04-28 VITALS — BP 115/68 | HR 64 | Ht 70.0 in | Wt 216.0 lb

## 2013-04-28 DIAGNOSIS — I10 Essential (primary) hypertension: Secondary | ICD-10-CM

## 2013-04-28 DIAGNOSIS — E785 Hyperlipidemia, unspecified: Secondary | ICD-10-CM

## 2013-04-28 DIAGNOSIS — I4891 Unspecified atrial fibrillation: Secondary | ICD-10-CM

## 2013-04-28 DIAGNOSIS — I2510 Atherosclerotic heart disease of native coronary artery without angina pectoris: Secondary | ICD-10-CM

## 2013-04-28 NOTE — Progress Notes (Signed)
HPI: 79 year old male previously followed by Dr. Riley Kill for follow up of coronary artery disease and atrial fibrillation. Patient is status post coronary artery bypassing graft in 1993 with a LIMA to the LAD, saphenous vein graft to the ramus intermedius and saphenous vein graft to the first diagonal. CT of the abdomen in January of 2009 showed no renal artery stenosis. There was no aortic aneurysm. Last echocardiogram in April of 2012 showed an ejection fraction of 45-50% and mild left atrial enlargement. Nuclear study in May of 2012 showed a previous inferior infarct. There was mild anterior ischemia. Patient treated medically at his choice based on Dr. Rosalyn Charters notes. She was last seen he denies dyspnea on exertion, orthopnea, PND, pedal edema, palpitations, syncope or exertional chest pain. 2 weeks ago he had bilateral rib pain and elevated blood pressure. He was seen in [facility] and apparently evaluation unremarkable. No records available.  Current Outpatient Prescriptions  Medication Sig Dispense Refill  . atorvastatin (LIPITOR) 20 MG tablet Take 10 mg by mouth daily.      . benazepril (LOTENSIN) 40 MG tablet TAKE 1 TABLET DAILY  30 tablet  11  . Cholecalciferol (VITAMIN D) 1000 UNITS capsule Take 1,000 Units by mouth daily.        . furosemide (LASIX) 40 MG tablet Take 0.5 tablets (20 mg total) by mouth daily.  1 tablet    . metoprolol succinate (TOPROL-XL) 25 MG 24 hr tablet Take 1 tablet (25 mg total) by mouth daily.  90 tablet  3  . Multiple Vitamin (MULTIVITAMIN) capsule Take 1 capsule by mouth daily.        . nitroGLYCERIN (NITROSTAT) 0.4 MG SL tablet Place 1 tablet (0.4 mg total) under the tongue every 5 (five) minutes as needed.  25 tablet  1  . pantoprazole (PROTONIX) 40 MG tablet Take 1 tablet (40 mg total) by mouth daily at 6 (six) AM.  30 tablet  11  . Probiotic Product (PROBIOTIC DAILY PO) Take 1 tablet by mouth daily.      . Rivaroxaban (XARELTO) 20 MG TABS Take 1  tablet (20 mg total) by mouth daily with supper.  90 tablet  3  . traMADol (ULTRAM) 50 MG tablet Take 1 tablet (50 mg total) by mouth every 6 (six) hours as needed for pain.  30 tablet  6  . vitamin B-12 (CYANOCOBALAMIN) 1000 MCG tablet Take 1,000 mcg by mouth daily.       No current facility-administered medications for this visit.     Past Medical History  Diagnosis Date  . CAD (coronary artery disease)     s/p CABG 1993 (L-LAD, S-RI, S-D1);   echo 1/10: EF 55%;    myoview 12/09: inf MI, no ischemia  . Hyperlipidemia   . HTN (hypertension)   . Folliculitis   . Obesity   . Osteoarthritis   . Irritable bladder   . Sleep disorder   . Family history of malignant neoplasm of gastrointestinal tract   . Myocardial infarction   . Candida esophagitis 2013    EGD   . Antral gastritis 2013    EGD   . Hiatal hernia     Past Surgical History  Procedure Laterality Date  . Coronary artery bypass graft      graft '92: LIMA-LAD, SVG - D2,R1, D1  . Cholecystectomy      laparoscopic '92  . Mole removal  2001 and 2008  . Pilonidal cyst excision    . Enteroscopy  08/22/2012    Procedure: ENTEROSCOPY;  Surgeon: Charna Elizabeth, MD;  Location: [facility];  Service: Endoscopy;  Laterality: N/A;    History   Social History  . Marital Status: Widowed    Spouse Name: N/A    Number of Children: 1  . Years of Education: N/A   Occupational History  . Retired    Social History Main Topics  . Smoking status: Former Smoker -- 16 years    Quit date: 08/14/1964  . Smokeless tobacco: Never Used     Comment: quit 1965  . Alcohol Use: No  . Drug Use: No  . Sexually Active: Not on file   Other Topics Concern  . 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 Married '57- widowed 04/15/2023. 1 son -'42- died 'Apr 22, 2023 MVA. Retired: keeps himself busy. ACP - not reviewed (Jan '13)    ROS: no fevers or chills, productive cough, hemoptysis, dysphasia, odynophagia, melena, hematochezia,  dysuria, hematuria, rash, seizure activity, orthopnea, PND, pedal edema, claudication. Remaining systems are negative.  Physical Exam: Well-developed well-nourished in no acute distress.  Skin is warm and dry.  HEENT is normal.  Neck is supple.  Chest is clear to auscultation with normal expansion.  Cardiovascular exam is irregular Abdominal exam nontender or distended. No masses palpated. Extremities show no edema. neuro grossly intact  ECG 02/08/2013-atrial fibrillation with PVCs or aberrantly conducted beats.

## 2013-04-28 NOTE — Assessment & Plan Note (Signed)
Continue statin. Lipids and liver monitored by primary care. 

## 2013-04-28 NOTE — Assessment & Plan Note (Signed)
Continue Toprol and xeralto. Check hemoglobin and renal function.

## 2013-04-28 NOTE — Assessment & Plan Note (Signed)
Blood pressure controlled. Continue present medications. Check potassium and renal function. 

## 2013-04-28 NOTE — Assessment & Plan Note (Signed)
Continue statin. Not on aspirin given need for anticoagulation and previous GI bleed. Repeat nuclear study. If normal or low risk plan medical therapy.

## 2013-04-28 NOTE — Patient Instructions (Addendum)
Your physician wants you to follow-up in: 6 MONTHS WITH DR Jens Som You will receive a reminder letter in the mail two months in advance. If you don't receive a letter, please call our office to schedule the follow-up appointment.   Your physician has requested that you have en exercise stress myoview. For further information please visit [email or web address] Please follow instruction sheet, as given.   Your physician recommends that you HAVE LAB WORK TODAY

## 2013-04-29 LAB — CBC
HCT: 42.1 % (ref 39.0–52.0)
Hemoglobin: 14.7 g/dL (ref 13.0–17.0)
MCH: 32.2 pg (ref 26.0–34.0)
MCHC: 34.9 g/dL (ref 30.0–36.0)
MCV: 92.1 fL (ref 78.0–100.0)
Platelets: 188 10*3/uL (ref 150–400)
RBC: 4.57 MIL/uL (ref 4.22–5.81)
RDW: 14.2 % (ref 11.5–15.5)
WBC: 6.2 10*3/uL (ref 4.0–10.5)

## 2013-04-29 LAB — BASIC METABOLIC PANEL
BUN: 19 mg/dL (ref 6–23)
CO2: 28 mEq/L (ref 19–32)
Calcium: 9.6 mg/dL (ref 8.4–10.5)
Chloride: 102 mEq/L (ref 96–112)
Creat: 1.46 mg/dL — ABNORMAL HIGH (ref 0.50–1.35)
Glucose, Bld: 72 mg/dL (ref 70–99)
Potassium: 4.1 mEq/L (ref 3.5–5.3)
Sodium: 143 mEq/L (ref 135–145)

## 2013-05-04 ENCOUNTER — Other Ambulatory Visit: Payer: Self-pay | Admitting: *Deleted

## 2013-05-04 DIAGNOSIS — R799 Abnormal finding of blood chemistry, unspecified: Secondary | ICD-10-CM

## 2013-05-13 LAB — BASIC METABOLIC PANEL WITH GFR
CO2: 26 mEq/L (ref 19–32)
Calcium: 9.6 mg/dL (ref 8.4–10.5)
Chloride: 104 mEq/L (ref 96–112)
Sodium: 139 mEq/L (ref 135–145)

## 2013-05-17 ENCOUNTER — Ambulatory Visit (HOSPITAL_COMMUNITY): Payer: Medicare Other | Attending: Cardiology | Admitting: Radiology

## 2013-05-17 VITALS — BP 163/91 | HR 59 | Ht 70.0 in | Wt 218.0 lb

## 2013-05-17 DIAGNOSIS — I4891 Unspecified atrial fibrillation: Secondary | ICD-10-CM

## 2013-05-17 DIAGNOSIS — I252 Old myocardial infarction: Secondary | ICD-10-CM | POA: Insufficient documentation

## 2013-05-17 DIAGNOSIS — R0609 Other forms of dyspnea: Secondary | ICD-10-CM | POA: Insufficient documentation

## 2013-05-17 DIAGNOSIS — Z951 Presence of aortocoronary bypass graft: Secondary | ICD-10-CM | POA: Insufficient documentation

## 2013-05-17 DIAGNOSIS — R42 Dizziness and giddiness: Secondary | ICD-10-CM | POA: Insufficient documentation

## 2013-05-17 DIAGNOSIS — R000 Tachycardia, unspecified: Secondary | ICD-10-CM | POA: Insufficient documentation

## 2013-05-17 DIAGNOSIS — I10 Essential (primary) hypertension: Secondary | ICD-10-CM | POA: Insufficient documentation

## 2013-05-17 DIAGNOSIS — I2510 Atherosclerotic heart disease of native coronary artery without angina pectoris: Secondary | ICD-10-CM

## 2013-05-17 DIAGNOSIS — Z87891 Personal history of nicotine dependence: Secondary | ICD-10-CM | POA: Insufficient documentation

## 2013-05-17 DIAGNOSIS — I4949 Other premature depolarization: Secondary | ICD-10-CM

## 2013-05-17 DIAGNOSIS — E785 Hyperlipidemia, unspecified: Secondary | ICD-10-CM | POA: Insufficient documentation

## 2013-05-17 DIAGNOSIS — R0989 Other specified symptoms and signs involving the circulatory and respiratory systems: Secondary | ICD-10-CM | POA: Insufficient documentation

## 2013-05-17 DIAGNOSIS — Z8249 Family history of ischemic heart disease and other diseases of the circulatory system: Secondary | ICD-10-CM | POA: Insufficient documentation

## 2013-05-17 DIAGNOSIS — R0602 Shortness of breath: Secondary | ICD-10-CM

## 2013-05-17 MED ORDER — TECHNETIUM TC 99M SESTAMIBI GENERIC - CARDIOLITE: 10.0000 | Freq: Once | INTRAVENOUS | Status: DC | PRN

## 2013-05-17 MED ORDER — TECHNETIUM TC 99M SESTAMIBI GENERIC - CARDIOLITE: 30.0000 | Freq: Once | INTRAVENOUS | Status: DC | PRN

## 2013-05-17 MED ORDER — TECHNETIUM TC 99M SESTAMIBI GENERIC - CARDIOLITE
11.0000 | Freq: Once | INTRAVENOUS | Status: AC | PRN
  Administered 2013-05-17: 11 via INTRAVENOUS

## 2013-05-17 MED ORDER — REGADENOSON 0.4 MG/5ML IV SOLN: 0.4000 mg | Freq: Once | INTRAVENOUS | Status: DC

## 2013-05-17 MED ORDER — TECHNETIUM TC 99M SESTAMIBI GENERIC - CARDIOLITE
33.0000 | Freq: Once | INTRAVENOUS | Status: AC | PRN
  Administered 2013-05-17: 33 via INTRAVENOUS

## 2013-05-17 NOTE — Progress Notes (Signed)
[facility] SITE 3 NUCLEAR MED [address] [place], [place] [address] [phone]    Cardiology Nuclear Med Study  [name] is a 79 y.o. male     MRN : [id number]     DOB: 14-Oct-1931  Procedure Date: 05/17/2013  Nuclear Med Background Indication for Stress Test:  Evaluation for Ischemia and Graft Patency History:  '92 IWMI; '93 CABG; '12 Echo:EF=50%; '12 OZH:YQMVH inferior infarct with mild anterior ischemia. Cardiac Risk Factors: Family History - CAD, History of Smoking, Hypertension and Lipids  Symptoms:  Dizziness, DOE, Light-Headedness and Rapid HR   Nuclear Pre-Procedure Caffeine/Decaff Intake:  None > 12 hrs NPO After: 7:00pm   Lungs:  Clear. O2 Sat: 96% on room air. IV 0.9% NS with Angio Cath:  20g  IV Site: R Antecubital x 1, tolerated well IV Started by:  Irean Hong, RN  Chest Size (in):  46 Cup Size: n/a  Height: 5\' 10"  (1.778 m)  Weight:  218 lb (98.884 kg)  BMI:  Body mass index is 31.28 kg/(m^2). Tech Comments:  Took Toprol, and Lotensin @ 6:45 am today    Nuclear Med Study 1 or 2 day study: 1 day  Stress Test Type:  Stress  Reading MD: Willa Rough, MD  Order Authorizing Provider:  Olga Millers, MD  Resting Radionuclide: Technetium 67m Sestamibi  Resting Radionuclide Dose: 11.0 mCi   Stress Radionuclide:  Technetium 54m Sestamibi  Stress Radionuclide Dose: 33.0 mCi           Stress Protocol Rest HR: 59 Stress HR: 130  Rest BP: 163/91 Stress BP: 197/125  Exercise Time (min): 5:00 METS: 7:00   Predicted Max HR: 141 bpm % Max HR: 92.2 bpm Rate Pressure Product: 84696   Dose of Adenosine (mg):  n/a Dose of Lexiscan: n/a mg  Dose of Atropine (mg): n/a Dose of Dobutamine: n/a mcg/kg/min (at max HR)  Stress Test Technologist: Smiley Houseman, CMA-N  Nuclear Technologist:  Domenic Polite, CNMT     Rest Procedure:  Myocardial perfusion imaging was performed at rest 45 minutes following the intravenous administration of Technetium 61m  Sestamibi.  Rest ECG: Atrial fibrillation with a normal QRS  Stress Procedure:  The patient exercised on the treadmill utilizing the Bruce Protocol for five minutes. The patient stopped due to dyspnea and denied any chest pain.  He had a hypertensive response in recovery.  Technetium 35m Sestamibi was injected at peak exercise and myocardial perfusion imaging was performed after a brief delay.  Stress ECG: No significant change from baseline ECG  QPS Raw Data Images:  Patient motion noted; appropriate software correction applied. Stress Images:  There is a moderate sized defect with mild decreased activity affecting the apical cap and mid/apical inferior segments. There is a small area of mild to moderate decreased activity affecting the base/mid anterior wall. Rest Images:  Moderate-sized defect with mild decreased activity affecting the apical cap and mid/apical inferior segments.  Subtraction (SDS):  There is reversibility in a small area affecting the base/mid anterior wall. Transient Ischemic Dilatation (Normal <1.22):  1.10 Lung/Heart Ratio (Normal <0.45):  0.35  Quantitative Gated Spect Images QGS EDV:  n/a QGS ESV:  n/a  Impression Exercise Capacity:  Fairly good exercise capacity for this 79 year old patient. BP Response:  Normal blood pressure response. Clinical Symptoms:  No significant symptoms noted. ECG Impression:  No significant ST segment change suggestive of ischemia. Comparison with Prior Nuclear Study: No significant change from previous study  Overall Impression:  There is no significant change since the study of May, 2012. There is a moderate area of the scar affecting the mid/apical inferior wall and the apical cap. There is a small area of ischemia affecting the base/mid anterior wall. Both of these findings were present in the past. Wall motion cannot be assessed. This is a moderate risk scan.  LV Ejection Fraction: Study not gated.  LV Wall Motion:  Study not  gated Because of the atrial fibrillation.  Willa Rough, MD

## 2013-05-26 ENCOUNTER — Ambulatory Visit: Payer: Medicare Other | Admitting: Cardiology

## 2013-05-27 ENCOUNTER — Ambulatory Visit: Payer: Medicare Other | Admitting: Internal Medicine

## 2013-06-02 ENCOUNTER — Other Ambulatory Visit: Payer: Self-pay | Admitting: *Deleted

## 2013-06-02 MED ORDER — METOPROLOL SUCCINATE ER 25 MG PO TB24: 25.0000 mg | ORAL_TABLET | Freq: Every day | ORAL | Status: DC

## 2013-06-09 ENCOUNTER — Ambulatory Visit (INDEPENDENT_AMBULATORY_CARE_PROVIDER_SITE_OTHER): Payer: Medicare Other | Admitting: Internal Medicine

## 2013-06-09 ENCOUNTER — Encounter: Payer: Self-pay | Admitting: Internal Medicine

## 2013-06-09 VITALS — BP 140/80 | HR 65 | Temp 97.4°F | Resp 12 | Ht 71.0 in | Wt 218.0 lb

## 2013-06-09 DIAGNOSIS — I10 Essential (primary) hypertension: Secondary | ICD-10-CM

## 2013-06-09 NOTE — Progress Notes (Signed)
Subjective:    Patient ID: [name], male    DOB: 1931-09-25, 79 y.o.   MRN: [id number]  HPI [name] had an excursion of his blood pressure and went to [facility] ED - BP was high and his toprol XR was increased to a full 25 mg daily. He saw Dr. Roena Malady may 22 and was doing well. IN the interval his Blood Pressure has been OK: SBP 120-130/80's. He has been feeling well.    He reports poor sleep. Requesting a sleep aide.   Past Medical History  Diagnosis Date  . CAD (coronary artery disease)     s/p CABG 1993 (L-LAD, S-RI, S-D1);   echo 1/10: EF 55%;    myoview 12/09: inf MI, no ischemia  . Hyperlipidemia   . HTN (hypertension)   . Folliculitis   . Obesity   . Osteoarthritis   . Irritable bladder   . Sleep disorder   . Family history of malignant neoplasm of gastrointestinal tract   . Myocardial infarction   . Candida esophagitis 2013    EGD   . Antral gastritis 2013    EGD   . Hiatal hernia    Past Surgical History  Procedure Laterality Date  . Coronary artery bypass graft      graft '92: LIMA-LAD, SVG - D2,R1, D1  . Cholecystectomy      laparoscopic '92  . Mole removal  2001 and 2008  . Pilonidal cyst excision    . Enteroscopy  08/22/2012    Procedure: ENTEROSCOPY;  Surgeon: Charna Elizabeth, MD;  Location: [facility];  Service: Endoscopy;  Laterality: N/A;   Family History  Problem Relation Age of Onset  . Coronary artery disease Father     and brother  . Heart attack Father     and brother-fatal  . Stroke Father   . Breast cancer Mother   . Colon cancer Maternal Uncle   . Esophageal cancer Neg Hx   . Stomach cancer Neg Hx   . Rectal cancer Neg Hx    History   Social History  . Marital Status: Widowed    Spouse Name: N/A    Number of Children: 1  . Years of Education: N/A   Occupational History  . Retired    Social History Main Topics  . Smoking status: Former Smoker -- 16 years    Quit date: 08/14/1964  . Smokeless tobacco: Never Used     Comment:  quit 1965  . Alcohol Use: No  . Drug Use: No  . Sexually Active: Not on file   Other Topics Concern  . 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 Married '57- widowed 21-Apr-2023. 1 son -'43- died '28-Apr-2023 MVA. Retired: keeps himself busy. ACP - not reviewed (Jan '13)    Current Outpatient Prescriptions on File Prior to Visit  Medication Sig Dispense Refill  . atorvastatin (LIPITOR) 20 MG tablet Take 10 mg by mouth daily.      . benazepril (LOTENSIN) 40 MG tablet TAKE 1 TABLET DAILY  30 tablet  11  . Cholecalciferol (VITAMIN D) 1000 UNITS capsule Take 1,000 Units by mouth daily.        . metoprolol succinate (TOPROL-XL) 25 MG 24 hr tablet Take 1 tablet (25 mg total) by mouth daily.  30 tablet  5  . Multiple Vitamin (MULTIVITAMIN) capsule Take 1 capsule by mouth daily.        . nitroGLYCERIN (NITROSTAT) 0.4 MG  SL tablet Place 1 tablet (0.4 mg total) under the tongue every 5 (five) minutes as needed.  25 tablet  1  . pantoprazole (PROTONIX) 40 MG tablet Take 1 tablet (40 mg total) by mouth daily at 6 (six) AM.  30 tablet  11  . Probiotic Product (PROBIOTIC DAILY PO) Take 1 tablet by mouth daily.      . Rivaroxaban (XARELTO) 20 MG TABS Take 1 tablet (20 mg total) by mouth daily with supper.  90 tablet  3  . traMADol (ULTRAM) 50 MG tablet Take 1 tablet (50 mg total) by mouth every 6 (six) hours as needed for pain.  30 tablet  6  . vitamin B-12 (CYANOCOBALAMIN) 1000 MCG tablet Take 1,000 mcg by mouth daily.       No current facility-administered medications on file prior to visit.     Review of Systems System review is negative for any constitutional, cardiac, pulmonary, GI or neuro symptoms or complaints other than as described in the HPI.     Objective:   Physical Exam Filed Vitals:   06/09/13 1312  BP: 140/80  Pulse: 65  Temp: 97.4 F (36.3 C)  Resp: 12   Wt Readings from Last 3 Encounters:  06/09/13 218 lb (98.884 kg)  05/17/13 218 lb (98.884 kg)  04/28/13 216 lb  (97.977 kg)   Gen'l - WNWD heavyset white man in no distress Heent - normal Cor - 2+ radial pulse, RRR Pulm - normal respirations, Lungs CTAP Neuro - A&O x 3, normal gait and station       Assessment & Plan:

## 2013-06-09 NOTE — Patient Instructions (Addendum)
Good to see you.,  Blood pressure is doing fine - continue taking your current medication.  Playing golf in this heat be sure to hydrate!!!  Return office visit in 4 months.

## 2013-06-12 NOTE — Assessment & Plan Note (Signed)
BP Readings from Last 3 Encounters:  06/09/13 140/80  05/17/13 163/91  04/28/13 115/68   Reasonable control at today's visit  Plan Continue present medications  Monitor BP at home and report back reading.

## 2013-07-06 ENCOUNTER — Other Ambulatory Visit: Payer: Self-pay | Admitting: *Deleted

## 2013-07-06 MED ORDER — ATORVASTATIN CALCIUM 20 MG PO TABS: 10.0000 mg | ORAL_TABLET | Freq: Every day | ORAL | Status: DC

## 2013-08-30 ENCOUNTER — Other Ambulatory Visit (HOSPITAL_COMMUNITY): Payer: Self-pay | Admitting: Internal Medicine

## 2013-09-28 ENCOUNTER — Other Ambulatory Visit: Payer: Self-pay

## 2013-09-28 DIAGNOSIS — I4891 Unspecified atrial fibrillation: Secondary | ICD-10-CM

## 2013-09-28 MED ORDER — ATORVASTATIN CALCIUM 20 MG PO TABS: 10.0000 mg | ORAL_TABLET | Freq: Every day | ORAL | Status: DC

## 2013-09-28 MED ORDER — RIVAROXABAN 20 MG PO TABS: 20.0000 mg | ORAL_TABLET | Freq: Every day | ORAL | Status: DC

## 2013-10-06 ENCOUNTER — Encounter: Payer: Self-pay | Admitting: Cardiology

## 2013-10-06 ENCOUNTER — Ambulatory Visit (INDEPENDENT_AMBULATORY_CARE_PROVIDER_SITE_OTHER): Payer: Medicare Other | Admitting: Cardiology

## 2013-10-06 VITALS — BP 132/80 | HR 65 | Ht 70.0 in | Wt 218.0 lb

## 2013-10-06 DIAGNOSIS — E785 Hyperlipidemia, unspecified: Secondary | ICD-10-CM

## 2013-10-06 DIAGNOSIS — I4891 Unspecified atrial fibrillation: Secondary | ICD-10-CM

## 2013-10-06 DIAGNOSIS — I2510 Atherosclerotic heart disease of native coronary artery without angina pectoris: Secondary | ICD-10-CM

## 2013-10-06 DIAGNOSIS — I10 Essential (primary) hypertension: Secondary | ICD-10-CM

## 2013-10-06 NOTE — Patient Instructions (Signed)
Your physician wants you to follow-up in: 6 MONTHS WITH DR CRENSHAW You will receive a reminder letter in the mail two months in advance. If you don't receive a letter, please call our office to schedule the follow-up appointment.  

## 2013-10-06 NOTE — Assessment & Plan Note (Signed)
Blood pressure controlled. Continue present medications. 

## 2013-10-06 NOTE — Assessment & Plan Note (Signed)
Continue statin. 

## 2013-10-06 NOTE — Progress Notes (Signed)
HPI: FU coronary artery disease and atrial fibrillation. Patient is status post coronary artery bypassing graft in 1993 with a LIMA to the LAD, saphenous vein graft to the ramus intermedius and saphenous vein graft to the first diagonal. CT of the abdomen in January of 2009 showed no renal artery stenosis. There was no aortic aneurysm. Last echocardiogram in April of 2012 showed an ejection fraction of 45-50% and mild left atrial enlargement. Nuclear study in June 2014 showed scar in the mid/apical inferior wall and apex. There was a small area of ischemia in the base to mid anterior wall. The study was unchanged compared to May of 2012. The study was not gated. We have treated medically. Since I last saw him the patient denies any dyspnea on exertion, orthopnea, PND, pedal edema, palpitations, syncope or chest pain.     Current Outpatient Prescriptions  Medication Sig Dispense Refill  . atorvastatin (LIPITOR) 20 MG tablet Take 20 mg by mouth daily.      . benazepril (LOTENSIN) 40 MG tablet TAKE 1 TABLET DAILY  30 tablet  11  . Cholecalciferol (VITAMIN D) 1000 UNITS capsule Take 1,000 Units by mouth daily.        . metoprolol succinate (TOPROL-XL) 25 MG 24 hr tablet Take 1 tablet (25 mg total) by mouth daily.  30 tablet  5  . Multiple Vitamin (MULTIVITAMIN) capsule Take 1 capsule by mouth daily.        . nitroGLYCERIN (NITROSTAT) 0.4 MG SL tablet Place 1 tablet (0.4 mg total) under the tongue every 5 (five) minutes as needed.  25 tablet  1  . pantoprazole (PROTONIX) 40 MG tablet TAKE 1 TABLET AT 6 MORNING  30 tablet  5  . Probiotic Product (PROBIOTIC DAILY PO) Take 1 tablet by mouth daily.      . Rivaroxaban (XARELTO) 20 MG TABS tablet Take 1 tablet (20 mg total) by mouth daily with supper.  90 tablet  0  . traMADol (ULTRAM) 50 MG tablet Take 1 tablet (50 mg total) by mouth every 6 (six) hours as needed for pain.  30 tablet  6  . vitamin B-12 (CYANOCOBALAMIN) 1000 MCG tablet Take 1,000 mcg  by mouth daily.       No current facility-administered medications for this visit.     Past Medical History  Diagnosis Date  . CAD (coronary artery disease)     s/p CABG 1993 (L-LAD, S-RI, S-D1);   echo 1/10: EF 55%;    myoview 12/09: inf MI, no ischemia  . Hyperlipidemia   . HTN (hypertension)   . Folliculitis   . Obesity   . Osteoarthritis   . Irritable bladder   . Sleep disorder   . Family history of malignant neoplasm of gastrointestinal tract   . Myocardial infarction   . Candida esophagitis 2013    EGD   . Antral gastritis 2013    EGD   . Hiatal hernia   . Atrial fibrillation     Past Surgical History  Procedure Laterality Date  . Coronary artery bypass graft      graft '92: LIMA-LAD, SVG - D2,R1, D1  . Cholecystectomy      laparoscopic '92  . Mole removal  2001 and 2008  . Pilonidal cyst excision    . Enteroscopy  08/22/2012    Procedure: ENTEROSCOPY;  Surgeon: Charna Elizabeth, MD;  Location: [facility];  Service: Endoscopy;  Laterality: N/A;    History   Social History  .  Marital Status: Widowed    Spouse Name: N/A    Number of Children: 1  . Years of Education: N/A   Occupational History  . Retired    Social History Main Topics  . Smoking status: Former Smoker -- 16 years    Quit date: 08/14/1964  . Smokeless tobacco: Never Used     Comment: quit 1965  . Alcohol Use: No  . Drug Use: No  . Sexual Activity: Not on file   Other Topics Concern  . 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 Married '57- widowed 04/24/2023. 1 son -'65- died '05-01-23 MVA. Retired: keeps himself busy. ACP - not reviewed (Jan '13)    ROS: no fevers or chills, productive cough, hemoptysis, dysphasia, odynophagia, melena, hematochezia, dysuria, hematuria, rash, seizure activity, orthopnea, PND, pedal edema, claudication. Remaining systems are negative.  Physical Exam: Well-developed well-nourished in no acute distress.  Skin is warm and dry.  HEENT is normal.    Neck is supple.  Chest is clear to auscultation with normal expansion.  Cardiovascular exam is irregular Abdominal exam nontender or distended. No masses palpated. Extremities show no edema. neuro grossly intact  ECG atrial fibrillation at a rate of 65. Nonspecific ST changes.

## 2013-10-06 NOTE — Assessment & Plan Note (Signed)
Patient remains in atrial fibrillation. Continue beta blocker for rate control. Continue xeralto. Check hemoglobin and renal function.

## 2013-10-06 NOTE — Assessment & Plan Note (Signed)
Recent nuclear study as outlined above. Patient is not having symptoms. Plan medical therapy. Continue statin. Not on aspirin given need for anticoagulation.

## 2013-10-07 LAB — BASIC METABOLIC PANEL WITH GFR
BUN: 17 mg/dL (ref 6–23)
CO2: 26 mEq/L (ref 19–32)
Chloride: 106 mEq/L (ref 96–112)
Creat: 1.3 mg/dL (ref 0.50–1.35)
Glucose, Bld: 114 mg/dL — ABNORMAL HIGH (ref 70–99)
Potassium: 4.1 mEq/L (ref 3.5–5.3)

## 2013-10-07 LAB — CBC
MCH: 32.5 pg (ref 26.0–34.0)
Platelets: 194 10*3/uL (ref 150–400)

## 2013-10-14 ENCOUNTER — Other Ambulatory Visit: Payer: Self-pay

## 2013-10-18 ENCOUNTER — Encounter (HOSPITAL_BASED_OUTPATIENT_CLINIC_OR_DEPARTMENT_OTHER): Payer: Self-pay | Admitting: Emergency Medicine

## 2013-10-18 ENCOUNTER — Emergency Department (HOSPITAL_BASED_OUTPATIENT_CLINIC_OR_DEPARTMENT_OTHER)
Admission: EM | Admit: 2013-10-18 | Discharge: 2013-10-19 | Disposition: A | Discharge status: Home / Self Care | Payer: Medicare Other | Attending: Emergency Medicine | Admitting: Emergency Medicine

## 2013-10-18 DIAGNOSIS — Z87891 Personal history of nicotine dependence: Secondary | ICD-10-CM | POA: Insufficient documentation

## 2013-10-18 DIAGNOSIS — E669 Obesity, unspecified: Secondary | ICD-10-CM | POA: Insufficient documentation

## 2013-10-18 DIAGNOSIS — Z79899 Other long term (current) drug therapy: Secondary | ICD-10-CM | POA: Insufficient documentation

## 2013-10-18 DIAGNOSIS — I2510 Atherosclerotic heart disease of native coronary artery without angina pectoris: Secondary | ICD-10-CM | POA: Insufficient documentation

## 2013-10-18 DIAGNOSIS — Z951 Presence of aortocoronary bypass graft: Secondary | ICD-10-CM | POA: Insufficient documentation

## 2013-10-18 DIAGNOSIS — I10 Essential (primary) hypertension: Secondary | ICD-10-CM

## 2013-10-18 DIAGNOSIS — I252 Old myocardial infarction: Secondary | ICD-10-CM | POA: Insufficient documentation

## 2013-10-18 DIAGNOSIS — I4891 Unspecified atrial fibrillation: Secondary | ICD-10-CM | POA: Insufficient documentation

## 2013-10-18 DIAGNOSIS — Z87448 Personal history of other diseases of urinary system: Secondary | ICD-10-CM | POA: Insufficient documentation

## 2013-10-18 DIAGNOSIS — Z872 Personal history of diseases of the skin and subcutaneous tissue: Secondary | ICD-10-CM | POA: Insufficient documentation

## 2013-10-18 DIAGNOSIS — E785 Hyperlipidemia, unspecified: Secondary | ICD-10-CM | POA: Insufficient documentation

## 2013-10-18 DIAGNOSIS — Z8719 Personal history of other diseases of the digestive system: Secondary | ICD-10-CM | POA: Insufficient documentation

## 2013-10-18 DIAGNOSIS — Z8619 Personal history of other infectious and parasitic diseases: Secondary | ICD-10-CM | POA: Insufficient documentation

## 2013-10-18 DIAGNOSIS — Z8739 Personal history of other diseases of the musculoskeletal system and connective tissue: Secondary | ICD-10-CM | POA: Insufficient documentation

## 2013-10-18 LAB — CBC WITH DIFFERENTIAL/PLATELET
Eosinophils Absolute: 0.2 10*3/uL (ref 0.0–0.7)
Eosinophils Relative: 3 % (ref 0–5)
HCT: 44 % (ref 39.0–52.0)
Hemoglobin: 15.6 g/dL (ref 13.0–17.0)
Lymphs Abs: 2.3 10*3/uL (ref 0.7–4.0)
MCH: 33.2 pg (ref 26.0–34.0)
MCHC: 35.5 g/dL (ref 30.0–36.0)
MCV: 93.6 fL (ref 78.0–100.0)
Monocytes Relative: 10 % (ref 3–12)
Platelets: 165 10*3/uL (ref 150–400)
RBC: 4.7 MIL/uL (ref 4.22–5.81)

## 2013-10-18 LAB — BASIC METABOLIC PANEL
BUN: 16 mg/dL (ref 6–23)
CO2: 25 mEq/L (ref 19–32)
Calcium: 9.7 mg/dL (ref 8.4–10.5)
Chloride: 105 mEq/L (ref 96–112)
Glucose, Bld: 186 mg/dL — ABNORMAL HIGH (ref 70–99)
Sodium: 141 mEq/L (ref 135–145)

## 2013-10-18 NOTE — ED Provider Notes (Signed)
CSN: [id number]     Arrival date & time 10/18/13  2255 History  This chart was scribed for Hanley Seamen, MD by Blanchard Kelch, ED Scribe. The patient was seen in room MH01/MH01. Patient's care was started at 11:11 PM.    Chief Complaint  Patient presents with  . Hypertension    Patient is a 79 y.o. male presenting with hypertension.  Hypertension    HPI Comments: [name] is a 79 y.o. male who presents to the Emergency Department complaining of an episode of asymptomatic hypertension that began this morning. He measured 190/90 at home before going to work and it is currently 213/106 in the ED. There are no mitigating or exacerbating factors other than admitted anxiety due to the elevated BP readings. He specifically denies pain, blurred vision, dizziness, chest pain, shortness of breath, fever, chills, nausea, vomiting, headache, weakness, or numbness. He recently had his hypertension medications refilled two days ago and is worried it is the wrong medication due to the elevated BP readings but he did not bring his medications to the ED.  His cardiologist is Dr. Jens Som.  Past Medical History  Diagnosis Date  . CAD (coronary artery disease)     s/p CABG 1993 (L-LAD, S-RI, S-D1);   echo 1/10: EF 55%;    myoview 12/09: inf MI, no ischemia  . Hyperlipidemia   . HTN (hypertension)   . Folliculitis   . Obesity   . Osteoarthritis   . Irritable bladder   . Sleep disorder   . Family history of malignant neoplasm of gastrointestinal tract   . Myocardial infarction   . Candida esophagitis 2013    EGD   . Antral gastritis 2013    EGD   . Hiatal hernia   . Atrial fibrillation    Past Surgical History  Procedure Laterality Date  . Coronary artery bypass graft      graft '92: LIMA-LAD, SVG - D2,R1, D1  . Cholecystectomy      laparoscopic '92  . Mole removal  2001 and 2008  . Pilonidal cyst excision    . Enteroscopy  08/22/2012    Procedure: ENTEROSCOPY;  Surgeon: Charna Elizabeth, MD;   Location: [facility];  Service: Endoscopy;  Laterality: N/A;   Family History  Problem Relation Age of Onset  . Coronary artery disease Father     and brother  . Heart attack Father     and brother-fatal  . Stroke Father   . Breast cancer Mother   . Colon cancer Maternal Uncle   . Esophageal cancer Neg Hx   . Stomach cancer Neg Hx   . Rectal cancer Neg Hx    History  Substance Use Topics  . Smoking status: Former Smoker -- 16 years    Quit date: 08/14/1964  . Smokeless tobacco: Never Used     Comment: quit 1965  . Alcohol Use: No    Review of Systems\ A complete 10 system review of systems was obtained and all systems are negative except as noted in the HPI and PMH.    Allergies  Review of patient's allergies indicates no known allergies.  Home Medications   Current Outpatient Rx  Name  Route  Sig  Dispense  Refill  . atorvastatin (LIPITOR) 20 MG tablet   Oral   Take 20 mg by mouth daily.         . benazepril (LOTENSIN) 40 MG tablet      TAKE 1 TABLET DAILY  30 tablet   11   . Cholecalciferol (VITAMIN D) 1000 UNITS capsule   Oral   Take 1,000 Units by mouth daily.           . metoprolol succinate (TOPROL-XL) 25 MG 24 hr tablet   Oral   Take 1 tablet (25 mg total) by mouth daily.   30 tablet   5   . Multiple Vitamin (MULTIVITAMIN) capsule   Oral   Take 1 capsule by mouth daily.           . nitroGLYCERIN (NITROSTAT) 0.4 MG SL tablet   Sublingual   Place 1 tablet (0.4 mg total) under the tongue every 5 (five) minutes as needed.   25 tablet   1   . pantoprazole (PROTONIX) 40 MG tablet      TAKE 1 TABLET AT 6 MORNING   30 tablet   5   . Probiotic Product (PROBIOTIC DAILY PO)   Oral   Take 1 tablet by mouth daily.         . Rivaroxaban (XARELTO) 20 MG TABS tablet   Oral   Take 1 tablet (20 mg total) by mouth daily with supper.   90 tablet   0   . traMADol (ULTRAM) 50 MG tablet   Oral   Take 1 tablet (50 mg total) by mouth every  6 (six) hours as needed for pain.   30 tablet   6   . vitamin B-12 (CYANOCOBALAMIN) 1000 MCG tablet   Oral   Take 1,000 mcg by mouth daily.          Triage Vitals: BP 213/106  Pulse 86  Temp(Src) 97.8 F (36.6 C) (Oral)  Resp 22  Ht 5\' 11"  (1.803 m)  Wt 218 lb (98.884 kg)  BMI 30.42 kg/m2  SpO2 99%  Physical Exam  Nursing note and vitals reviewed.  General: Well-developed, well-nourished male in no acute distress; appearance consistent with age of record HENT: normocephalic; atraumatic Eyes: pupils equal, round and reactive to light; extraocular muscles intact Neck: supple Heart: regular rate and irregular rhythm (c/w atrial fibrillation); no murmurs, rubs or gallops. In atrial fibrillation whic Lungs: clear to auscultation bilaterally Abdomen: soft; nondistended; nontender; no masses or hepatosplenomegaly; bowel sounds present Extremities: No deformity; full range of motion; pulses normal, no edema Neurologic: Awake, alert and oriented; motor function intact in all extremities and symmetric; no facial droop Skin: Warm and dry Psychiatric: Normal mood and affect   ED Course  Procedures (including critical care time)  DIAGNOSTIC STUDIES: Oxygen Saturation is 99% on room air, normal by my interpretation.    COORDINATION OF CARE: 11:12 PM -Will order BMP and CBC. Patient verbalizes understanding and agrees with treatment plan.      EKG Interpretation     Ventricular Rate:  76 PR Interval:    QRS Duration: 102 QT Interval:  406 QTC Calculation: 456 R Axis:   2 Text Interpretation:  Atrial fibrillation with premature ventricular or aberrantly conducted complexes Abnormal ECG No significant change was found      MDM   Nursing notes and vitals signs, including pulse oximetry, reviewed.  Summary of this visit's results, reviewed by myself:  Labs:  Results for orders placed during the hospital encounter of 10/18/13 (from the past 24 hour(s))  BASIC  METABOLIC PANEL     Status: Abnormal   Collection Time    10/18/13 11:25 PM      Result Value Range   Sodium 141  135 -  145 mEq/L   Potassium 3.8  3.5 - 5.1 mEq/L   Chloride 105  96 - 112 mEq/L   CO2 25  19 - 32 mEq/L   Glucose, Bld 186 (*) 70 - 99 mg/dL   BUN 16  6 - 23 mg/dL   Creatinine, Ser 2.95  0.50 - 1.35 mg/dL   Calcium 9.7  8.4 - 62.1 mg/dL   GFR calc non Af Amer 51 (*) >90 mL/min   GFR calc Af Amer 59 (*) >90 mL/min  CBC WITH DIFFERENTIAL     Status: None   Collection Time    10/18/13 11:25 PM      Result Value Range   WBC 6.5  4.0 - 10.5 K/uL   RBC 4.70  4.22 - 5.81 MIL/uL   Hemoglobin 15.6  13.0 - 17.0 g/dL   HCT 30.8  65.7 - 84.6 %   MCV 93.6  78.0 - 100.0 fL   MCH 33.2  26.0 - 34.0 pg   MCHC 35.5  30.0 - 36.0 g/dL   RDW 96.2  95.2 - 84.1 %   Platelets 165  150 - 400 K/uL   Neutrophils Relative % 52  43 - 77 %   Neutro Abs 3.4  1.7 - 7.7 K/uL   Lymphocytes Relative 35  12 - 46 %   Lymphs Abs 2.3  0.7 - 4.0 K/uL   Monocytes Relative 10  3 - 12 %   Monocytes Absolute 0.7  0.1 - 1.0 K/uL   Eosinophils Relative 3  0 - 5 %   Eosinophils Absolute 0.2  0.0 - 0.7 K/uL   Basophils Relative 0  0 - 1 %   Basophils Absolute 0.0  0.0 - 0.1 K/uL    Patient advised that in the absence of evidence of end organ damage acute intervention is contraindicated. He should followup with his primary care physician regarding long-term control of his blood pressure.    I personally performed the services described in this documentation, which was scribed in my presence.  The recorded information has been reviewed and is accurate.   Hanley Seamen, MD 10/18/13 [phone]

## 2013-10-18 NOTE — ED Notes (Signed)
Hypertension. States his BP has been elevated all day. No physical complaints. States he got his Rx filled 2 days ago and he is afraid the pharmacy gave him the wrong medication.

## 2013-10-20 ENCOUNTER — Telehealth: Payer: Self-pay | Admitting: *Deleted

## 2013-10-20 NOTE — Telephone Encounter (Signed)
OV tomorrow

## 2013-10-20 NOTE — Telephone Encounter (Signed)
Pt called states his blood pressure has been elevated recently.  Pt reports BP 210/100 went to Urgent care BP 180/100 and this morning 150/84.  Please advise pt

## 2013-10-20 NOTE — Telephone Encounter (Signed)
Pt scheduled for tomorrow at 3pm.  Pt notified

## 2013-10-21 ENCOUNTER — Ambulatory Visit (INDEPENDENT_AMBULATORY_CARE_PROVIDER_SITE_OTHER): Payer: Medicare Other | Admitting: Internal Medicine

## 2013-10-21 ENCOUNTER — Encounter: Payer: Self-pay | Admitting: Internal Medicine

## 2013-10-21 VITALS — BP 124/72 | HR 65 | Temp 97.1°F | Wt 219.0 lb

## 2013-10-21 DIAGNOSIS — I10 Essential (primary) hypertension: Secondary | ICD-10-CM

## 2013-10-21 MED ORDER — AMLODIPINE BESYLATE 2.5 MG PO TABS: 2.5000 mg | ORAL_TABLET | Freq: Every day | ORAL | Status: DC

## 2013-10-21 NOTE — Patient Instructions (Signed)
Blood pressure - the top number, systolic pressure, has been higher than usual and on one occasion really high. No symptoms by your report and no physical findings of malignant, damaging blood pressure.  Plan Continue your current medications  Start Amlodipine 2.5 mg ( the lowest dose) once a day.  Monitor your blood pressure and send me a report by MyChart.

## 2013-10-21 NOTE — Progress Notes (Signed)
Pre visit review using our clinic review tool, if applicable. No additional management support is needed unless otherwise documented below in the visit note. 

## 2013-10-24 NOTE — Assessment & Plan Note (Signed)
Blood pressure - the top number, systolic pressure, has been higher than usual and on one occasion really high. No symptoms by your report and no physical findings of malignant, damaging blood pressure. Prefers to avoid diuretics.  Plan Continue your current medications  Start Amlodipine 2.5 mg ( the lowest dose) once a day.  Monitor your blood pressure and send me a report by MyChart.

## 2013-10-24 NOTE — Progress Notes (Signed)
  Subjective:    Patient ID: [name], male    DOB: 11-05-1931, 79 y.o.   MRN: [id number]  HPI Mr. Hofmann presents for Blood pressure check. He had a very elevated reading of 203/100+ x 1. He was asymptomatic. He brings a record of other readings that reveal generally good control. He has had no headaches, nose bleeds, change in vision or other signs of poor control. He has been taking his medications.  PMH, FamHx and SocHx reviewed for any changes and relevance..  Current Outpatient Prescriptions on File Prior to Visit  Medication Sig Dispense Refill  . atorvastatin (LIPITOR) 20 MG tablet Take 20 mg by mouth daily.      . benazepril (LOTENSIN) 40 MG tablet TAKE 1 TABLET DAILY  30 tablet  11  . Cholecalciferol (VITAMIN D) 1000 UNITS capsule Take 1,000 Units by mouth daily.        . metoprolol succinate (TOPROL-XL) 25 MG 24 hr tablet Take 1 tablet (25 mg total) by mouth daily.  30 tablet  5  . Multiple Vitamin (MULTIVITAMIN) capsule Take 1 capsule by mouth daily.        . nitroGLYCERIN (NITROSTAT) 0.4 MG SL tablet Place 1 tablet (0.4 mg total) under the tongue every 5 (five) minutes as needed.  25 tablet  1  . pantoprazole (PROTONIX) 40 MG tablet TAKE 1 TABLET AT 6 MORNING  30 tablet  5  . Probiotic Product (PROBIOTIC DAILY PO) Take 1 tablet by mouth daily.      . Rivaroxaban (XARELTO) 20 MG TABS tablet Take 1 tablet (20 mg total) by mouth daily with supper.  90 tablet  0  . traMADol (ULTRAM) 50 MG tablet Take 1 tablet (50 mg total) by mouth every 6 (six) hours as needed for pain.  30 tablet  6  . vitamin B-12 (CYANOCOBALAMIN) 1000 MCG tablet Take 1,000 mcg by mouth daily.       No current facility-administered medications on file prior to visit.      Review of Systems System review is negative for any constitutional, cardiac, pulmonary, GI or neuro symptoms or complaints other than as described in the HPI.     Objective:   Physical Exam Filed Vitals:   10/21/13 1509  BP: 124/72   Pulse: 65  Temp: 97.1 F (36.2 C)   BP Readings from Last 3 Encounters:  10/21/13 124/72  10/19/13 161/82  10/06/13 132/80   Gen'l- WNWD white man in no distress HEENT- fundiscopic exam w/o papilledema, no exudates of hemorrhages Cor - RRR Neuro - non-focal, normal get up and go and normal gait.        Assessment & Plan:

## 2013-11-09 ENCOUNTER — Emergency Department
Admission: EM | Admit: 2013-11-09 | Discharge: 2013-11-09 | Disposition: A | Discharge status: Home / Self Care | Payer: Medicare Other | Source: Home / Self Care | Attending: Emergency Medicine | Admitting: Emergency Medicine

## 2013-11-09 ENCOUNTER — Encounter: Payer: Self-pay | Admitting: Emergency Medicine

## 2013-11-09 DIAGNOSIS — J0100 Acute maxillary sinusitis, unspecified: Secondary | ICD-10-CM

## 2013-11-09 MED ORDER — AMOXICILLIN 875 MG PO TABS: 875.0000 mg | ORAL_TABLET | Freq: Two times a day (BID) | ORAL | Status: DC

## 2013-11-09 NOTE — ED Provider Notes (Addendum)
CSN: [id number]     Arrival date & time 11/09/13  0945 History   First MD Initiated Contact with Patient 11/09/13 0946       HPI URI HISTORY  [name] is a 79 y.o. male who complains of onset of 7 days of occasional sneezing and occasional nasal congestion. Occasional clearish mucus. Otherwise he can breathe through his nose normally.No chills/sweats No  Fever.  He presents today because there was some bloody mucus from left nostril after blowing his nose this morning.--No other blood or nosebleed.-He takes Xarelto chronically, long-standing for atrial fibrillation, followed by Dr. Jens Som his cardiologist. Last monitoring visit was normal one month ago. He denies any other bleeding. No melena.  + Minimal  Nasal congestion, not currently. Had scant Discolored Post-nasal drainage No sinus pain/pressure No sore throat  No  cough No wheezing No chest congestion No hemoptysis No shortness of breath No pleuritic pain  Eyes: Has chronic dry eyes, on eyedrops for this by his eye doctor. Denies any new eye symptoms. Denies eye discharge . No earache.  No nausea No vomiting No abdominal pain No diarrhea  No skin rashes, except he mentions that he has resolving cold sore upper lip, may have started as a pustule but that resolved, and he just has some dryness in the upper lip. He has an appointment to followup with his cardiologist in one week. No new  fatigue, other than his normal baseline. No myalgias No headache  Past Medical History  Diagnosis Date  . CAD (coronary artery disease)     s/p CABG 1993 (L-LAD, S-RI, S-D1);   echo 1/10: EF 55%;    myoview 12/09: inf MI, no ischemia  . Hyperlipidemia   . HTN (hypertension)   . Folliculitis   . Obesity   . Osteoarthritis   . Irritable bladder   . Sleep disorder   . Family history of malignant neoplasm of gastrointestinal tract   . Myocardial infarction   . Candida esophagitis 2013    EGD   . Antral gastritis 2013    EGD   .  Hiatal hernia   . Atrial fibrillation    Past Surgical History  Procedure Laterality Date  . Coronary artery bypass graft      graft '92: LIMA-LAD, SVG - D2,R1, D1  . Cholecystectomy      laparoscopic '92  . Mole removal  2001 and 2008  . Pilonidal cyst excision    . Enteroscopy  08/22/2012    Procedure: ENTEROSCOPY;  Surgeon: Charna Elizabeth, MD;  Location: [facility];  Service: Endoscopy;  Laterality: N/A;   Family History  Problem Relation Age of Onset  . Coronary artery disease Father     and brother  . Heart attack Father     and brother-fatal  . Stroke Father   . Breast cancer Mother   . Colon cancer Maternal Uncle   . Esophageal cancer Neg Hx   . Stomach cancer Neg Hx   . Rectal cancer Neg Hx    History  Substance Use Topics  . Smoking status: Former Smoker -- 16 years    Quit date: 08/14/1964  . Smokeless tobacco: Never Used     Comment: quit 1965  . Alcohol Use: No    Review of Systems  Genitourinary: Negative for hematuria.  Neurological: Negative for seizures, syncope and light-headedness.  All other systems reviewed and are negative.    Allergies  Review of patient's allergies indicates no known allergies.  Home Medications  Current Outpatient Rx  Name  Route  Sig  Dispense  Refill  . amLODipine (NORVASC) 2.5 MG tablet   Oral   Take 1 tablet (2.5 mg total) by mouth daily.   30 tablet   3   . amoxicillin (AMOXIL) 875 MG tablet   Oral   Take 1 tablet (875 mg total) by mouth 2 (two) times daily. Take for 10 days.   20 tablet   0   . atorvastatin (LIPITOR) 20 MG tablet   Oral   Take 20 mg by mouth daily.         . benazepril (LOTENSIN) 40 MG tablet      TAKE 1 TABLET DAILY   30 tablet   11   . Cholecalciferol (VITAMIN D) 1000 UNITS capsule   Oral   Take 1,000 Units by mouth daily.           . metoprolol succinate (TOPROL-XL) 25 MG 24 hr tablet   Oral   Take 1 tablet (25 mg total) by mouth daily.   30 tablet   5   . Multiple  Vitamin (MULTIVITAMIN) capsule   Oral   Take 1 capsule by mouth daily.           . nitroGLYCERIN (NITROSTAT) 0.4 MG SL tablet   Sublingual   Place 1 tablet (0.4 mg total) under the tongue every 5 (five) minutes as needed.   25 tablet   1   . pantoprazole (PROTONIX) 40 MG tablet      TAKE 1 TABLET AT 6 MORNING   30 tablet   5   . Probiotic Product (PROBIOTIC DAILY PO)   Oral   Take 1 tablet by mouth daily.         . Rivaroxaban (XARELTO) 20 MG TABS tablet   Oral   Take 1 tablet (20 mg total) by mouth daily with supper.   90 tablet   0   . traMADol (ULTRAM) 50 MG tablet   Oral   Take 1 tablet (50 mg total) by mouth every 6 (six) hours as needed for pain.   30 tablet   6   . vitamin B-12 (CYANOCOBALAMIN) 1000 MCG tablet   Oral   Take 1,000 mcg by mouth daily.          BP 137/83  Pulse 70  Temp(Src) 97.6 F (36.4 C) (Oral)  Resp 14  Wt 222 lb (100.699 kg)  SpO2 96% Physical Exam  Nursing note and vitals reviewed. Constitutional: He is oriented to person, place, and time. He appears well-developed and well-nourished. No distress.  HENT:  Head: Normocephalic and atraumatic.  Right Ear: Tympanic membrane, external ear and ear canal normal.  Left Ear: Tympanic membrane, external ear and ear canal normal.  Nose: Mucosal edema (minimal) present. No rhinorrhea or sinus tenderness. No epistaxis.  No foreign bodies. Right sinus exhibits no maxillary sinus tenderness. Left sinus exhibits no maxillary sinus tenderness.  Mouth/Throat: Oropharynx is clear and moist. No oral lesions. No oropharyngeal exudate.  Minimal dry skin external ear canals. No impaction.  No nose bleed seen. Minimal irritation left nasal mucosa. But no lesions.  Eyes: Right eye exhibits no discharge. Left eye exhibits no discharge. No scleral icterus.  Neck: Neck supple.  Cardiovascular: Normal rate and normal heart sounds.  An irregularly irregular rhythm present.  Consistent with his chronic  atrial fib  Pulmonary/Chest: Effort normal and breath sounds normal. He has no wheezes. He has no rales.  Lymphadenopathy:  He has no cervical adenopathy.  Neurological: He is alert and oriented to person, place, and time.  Skin: Skin is warm and dry.  Minimal dryness upper lip, but no active lesions seen.    ED Course  Procedures (including critical care time) Labs Review Labs Reviewed - No data to display Imaging Review No results found.  EKG Interpretation    Date/Time:    Ventricular Rate:    PR Interval:    QRS Duration:   QT Interval:    QTC Calculation:   R Axis:     Text Interpretation:              MDM   1. Acute maxillary sinusitis    Discuss with him at length. He might have an early sinusitis, but he likely has nasal irritation from dryness. After risks benefits alternatives discussed, at his request, I wrote a prescription for amoxicillin 875 twice a day x10 days, to fill if he develops fever or discolored nasal mucus. Advised humidifier, gentle use of nasal saline irrigation, but precautions discussed. His hypertension is controlled on meds, followup with Dr. Jens Som.--Advised to avoid any decongestants which can elevate BP. In my opinion, I would not add an antihistamine, because that might over dry. He had numerous questions about his chronic recurring lesions upper lip which are minimal now, and I advised he keep followup appointment with dermatologist next week. For chronic dry eyes, continue using eyedrops lubricants prescribed by ophthalmologist, and followup with ophthalmologist. For the dry skin and external ears, I suggested OTC Cortaid cream when necessary, but followup with dermatologist or ENT if this is not helping. Precautions discussed. Red flags discussed. Questions invited and answered. Patient voiced understanding and agreement.   Lajean Manes, MD 11/09/13 1048  Lajean Manes, MD 11/09/13 1050

## 2013-11-09 NOTE — ED Notes (Signed)
[name] c/o sneezing and congestion x 1 week. Today he noted bloody mucous from sinuses. Denies fever.

## 2013-12-09 ENCOUNTER — Emergency Department
Admission: EM | Admit: 2013-12-09 | Discharge: 2013-12-09 | Disposition: A | Discharge status: Home / Self Care | Payer: Medicare Other | Source: Home / Self Care | Attending: Family Medicine | Admitting: Family Medicine

## 2013-12-09 ENCOUNTER — Encounter: Payer: Self-pay | Admitting: Emergency Medicine

## 2013-12-09 DIAGNOSIS — J069 Acute upper respiratory infection, unspecified: Secondary | ICD-10-CM

## 2013-12-09 MED ORDER — AZITHROMYCIN 250 MG PO TABS: ORAL_TABLET | ORAL | Status: DC

## 2013-12-09 NOTE — ED Notes (Signed)
Nisaiah c/o sneezing, congestion and cough with runny nose x 2 days. Denies fever. Received flu vac this season.

## 2013-12-11 ENCOUNTER — Telehealth: Payer: Self-pay

## 2013-12-11 NOTE — ED Notes (Signed)
I called and spoke with patient and he is doing better. I advised to call back if anything changes or if he has questions or concerns.  

## 2013-12-13 ENCOUNTER — Encounter: Payer: Self-pay | Admitting: Emergency Medicine

## 2013-12-13 ENCOUNTER — Emergency Department
Admission: EM | Admit: 2013-12-13 | Discharge: 2013-12-13 | Disposition: A | Discharge status: Home / Self Care | Payer: Medicare Other | Source: Home / Self Care | Attending: Family Medicine | Admitting: Family Medicine

## 2013-12-13 DIAGNOSIS — R059 Cough, unspecified: Secondary | ICD-10-CM

## 2013-12-13 DIAGNOSIS — R05 Cough: Secondary | ICD-10-CM

## 2013-12-13 DIAGNOSIS — K921 Melena: Secondary | ICD-10-CM

## 2013-12-13 LAB — POCT CBC W AUTO DIFF (K'VILLE URGENT CARE)

## 2013-12-13 NOTE — ED Notes (Signed)
Patient c/o cough that is not getting better patient states he was here 12/09/13.

## 2013-12-13 NOTE — ED Provider Notes (Addendum)
CSN: [id number]     Arrival date & time 12/13/13  1054 History   First MD Initiated Contact with Patient 12/13/13 1154     Chief Complaint  Patient presents with  . Cough      HPI Comments: Patient developed a URI about 6 days ago and was treated with a Z-pack four days ago, which he finishes today.  He states that he generally feels better but still has a non-productive cough.  No fevers, chills, and sweats.  No shortness of breath.  No pleuritic pain   The history is provided by the patient.    Past Medical History  Diagnosis Date  . CAD (coronary artery disease)     s/p CABG 1993 (L-LAD, S-RI, S-D1);   echo 1/10: EF 55%;    myoview 12/09: inf MI, no ischemia  . Hyperlipidemia   . HTN (hypertension)   . Folliculitis   . Obesity   . Osteoarthritis   . Irritable bladder   . Sleep disorder   . Family history of malignant neoplasm of gastrointestinal tract   . Myocardial infarction   . Candida esophagitis 2013    EGD   . Antral gastritis 2013    EGD   . Hiatal hernia   . Atrial fibrillation    Past Surgical History  Procedure Laterality Date  . Coronary artery bypass graft      graft '92: LIMA-LAD, SVG - D2,R1, D1  . Cholecystectomy      laparoscopic '92  . Mole removal  2001 and 2008  . Pilonidal cyst excision    . Enteroscopy  08/22/2012    Procedure: ENTEROSCOPY;  Surgeon: Juanita Craver, MD;  Location: [facility];  Service: Endoscopy;  Laterality: N/A;   Family History  Problem Relation Age of Onset  . Coronary artery disease Father     and brother  . Heart attack Father     and brother-fatal  . Stroke Father   . Breast cancer Mother   . Colon cancer Maternal Uncle   . Esophageal cancer Neg Hx   . Stomach cancer Neg Hx   . Rectal cancer Neg Hx    History  Substance Use Topics  . Smoking status: Former Smoker -- 16 years    Quit date: 08/14/1964  . Smokeless tobacco: Never Used     Comment: quit 1965  . Alcohol Use: No    Review of Systems No sore  throat at present + cough No pleuritic pain No wheezing Minimal nasal congestion No post-nasal drainage No sinus pain/pressure No itchy/red eyes No earache No hemoptysis No SOB No fever/chills No nausea No vomiting No abdominal pain No diarrhea No urinary symptoms No skin rash No fatigue No myalgias No headache Used OTC meds without relief  Allergies  Review of patient's allergies indicates no known allergies.  Home Medications   Current Outpatient Rx  Name  Route  Sig  Dispense  Refill  . amLODipine (NORVASC) 2.5 MG tablet   Oral   Take 1 tablet (2.5 mg total) by mouth daily.   30 tablet   3   . atorvastatin (LIPITOR) 20 MG tablet   Oral   Take 20 mg by mouth daily.         Marland Kitchen azithromycin (ZITHROMAX) 250 MG tablet      Take 2 tabs PO x 1 dose, then 1 tab PO QD x 4 days   6 tablet   0   . benazepril (LOTENSIN) 40 MG tablet  TAKE 1 TABLET DAILY   30 tablet   11   . Cholecalciferol (VITAMIN D) 1000 UNITS capsule   Oral   Take 1,000 Units by mouth daily.           . metoprolol succinate (TOPROL-XL) 25 MG 24 hr tablet   Oral   Take 1 tablet (25 mg total) by mouth daily.   30 tablet   5   . Multiple Vitamin (MULTIVITAMIN) capsule   Oral   Take 1 capsule by mouth daily.           . nitroGLYCERIN (NITROSTAT) 0.4 MG SL tablet   Sublingual   Place 1 tablet (0.4 mg total) under the tongue every 5 (five) minutes as needed.   25 tablet   1   . pantoprazole (PROTONIX) 40 MG tablet      TAKE 1 TABLET AT 6 MORNING   30 tablet   5   . Probiotic Product (PROBIOTIC DAILY PO)   Oral   Take 1 tablet by mouth daily.         . Rivaroxaban (XARELTO) 20 MG TABS tablet   Oral   Take 1 tablet (20 mg total) by mouth daily with supper.   90 tablet   0   . traMADol (ULTRAM) 50 MG tablet   Oral   Take 1 tablet (50 mg total) by mouth every 6 (six) hours as needed for pain.   30 tablet   6   . vitamin B-12 (CYANOCOBALAMIN) 1000 MCG tablet    Oral   Take 1,000 mcg by mouth daily.          BP 143/90  Pulse 82  Temp(Src) 97.6 F (36.4 C) (Oral)  Resp 16  Ht 5\' 10"  (1.778 m)  Wt 222 lb 8 oz (100.925 kg)  BMI 31.93 kg/m2  SpO2 97% Physical Exam Nursing notes and Vital Signs reviewed. Appearance:  Patient appears stated age, and in no acute distress Eyes:  Pupils are equal, round, and reactive to light and accomodation.  Extraocular movement is intact.  Conjunctivae are not inflamed  Ears:  Canals normal.  Tympanic membranes normal.  Nose:  Mildly congested turbinates.  No sinus tenderness.   Pharynx:  Normal Neck:  Supple.   Non-tender shotty anterior/posterior nodes are palpated bilaterally  Lungs:   Few faint bibasilar posterior wheezes heard.  Breath sounds are equal.  Heart:  Regular rate and rhythm without murmurs, rubs, or gallops.  Abdomen:  Nontender without masses or hepatosplenomegaly.  Bowel sounds are present.  No CVA or flank tenderness.  Extremities:  No edema.  No calf tenderness Skin:  No rash present.   ED Course  Procedures  none   Labs reviewed:  POCT CBC:  WBC 5.4; LY 35.9; MO 10.1; GR 54.0; Hgb 15.0; Platelets 153    MDM   1. Cough:  Resolving URI with post-infectious cough   Reassurance. Take plain Mucinex (1200 mg guaifenesin) twice daily for cough and congestion.  Increase fluid intake, rest. For sinus congestion may use Afrin nasal spray (or generic oxymetazoline) twice daily for about 5 days.  Also recommend using saline nasal spray several times daily and saline nasal irrigation (AYR is a common brand) May take Delsym Cough Suppressant at bedtime for nighttime cough.  Stop all antihistamines for now, and other non-prescription cough/cold preparations.   Follow-up with family doctor if not improving about 5 days or if fever develops    Kandra Nicolas, MD 12/13/13 1242  Kandra Nicolas, MD 12/13/13 1246

## 2013-12-13 NOTE — Discharge Instructions (Signed)
Take plain Mucinex (1200 mg guaifenesin) twice daily for cough and congestion.  Increase fluid intake, rest. For sinus congestion may use Afrin nasal spray (or generic oxymetazoline) twice daily for about 5 days.  Also recommend using saline nasal spray several times daily and saline nasal irrigation (AYR is a common brand) May take Delsym Cough Suppressant at bedtime for nighttime cough.  Stop all antihistamines for now, and other non-prescription cough/cold preparations.   Follow-up with family doctor if not improving about 5 days.

## 2013-12-16 NOTE — ED Provider Notes (Signed)
CSN: [id number]     Arrival date & time 12/09/13  1112 History   First MD Initiated Contact with Patient 12/09/13 1122     Chief Complaint  Patient presents with  . URI    HPI  URI Symptoms Onset: 2-3 days  Description: rhinorrhea, nasal congestion, cough  Modifying factors:  none  Symptoms Nasal discharge: yes Fever: no Sore throat: no Cough: yes Wheezing: no Ear pain: no GI symptoms: no Sick contacts: yes  Red Flags  Stiff neck: no Dyspnea: no Rash: no Swallowing difficulty: no  Sinusitis Risk Factors Headache/face pain: no Double sickening: ono tooth pain: no  Allergy Risk Factors Sneezing: no Itchy scratchy throat: no Seasonal symptoms: no  Flu Risk Factors Headache: no muscle aches: no severe fatigue: no   Past Medical History  Diagnosis Date  . CAD (coronary artery disease)     s/p CABG 1993 (L-LAD, S-RI, S-D1);   echo 1/10: EF 55%;    myoview 12/09: inf MI, no ischemia  . Hyperlipidemia   . HTN (hypertension)   . Folliculitis   . Obesity   . Osteoarthritis   . Irritable bladder   . Sleep disorder   . Family history of malignant neoplasm of gastrointestinal tract   . Myocardial infarction   . Candida esophagitis 2013    EGD   . Antral gastritis 2013    EGD   . Hiatal hernia   . Atrial fibrillation    Past Surgical History  Procedure Laterality Date  . Coronary artery bypass graft      graft '92: LIMA-LAD, SVG - D2,R1, D1  . Cholecystectomy      laparoscopic '92  . Mole removal  2001 and 2008  . Pilonidal cyst excision    . Enteroscopy  08/22/2012    Procedure: ENTEROSCOPY;  Surgeon: Juanita Craver, MD;  Location: [facility];  Service: Endoscopy;  Laterality: N/A;   Family History  Problem Relation Age of Onset  . Coronary artery disease Father     and brother  . Heart attack Father     and brother-fatal  . Stroke Father   . Breast cancer Mother   . Colon cancer Maternal Uncle   . Esophageal cancer Neg Hx   . Stomach cancer Neg  Hx   . Rectal cancer Neg Hx    History  Substance Use Topics  . Smoking status: Former Smoker -- 16 years    Quit date: 08/14/1964  . Smokeless tobacco: Never Used     Comment: quit 1965  . Alcohol Use: No    Review of Systems  All other systems reviewed and are negative.    Allergies  Review of patient's allergies indicates no known allergies.  Home Medications   Current Outpatient Rx  Name  Route  Sig  Dispense  Refill  . amLODipine (NORVASC) 2.5 MG tablet   Oral   Take 1 tablet (2.5 mg total) by mouth daily.   30 tablet   3   . atorvastatin (LIPITOR) 20 MG tablet   Oral   Take 20 mg by mouth daily.         Marland Kitchen azithromycin (ZITHROMAX) 250 MG tablet      Take 2 tabs PO x 1 dose, then 1 tab PO QD x 4 days   6 tablet   0   . benazepril (LOTENSIN) 40 MG tablet      TAKE 1 TABLET DAILY   30 tablet   11   . Cholecalciferol (  VITAMIN D) 1000 UNITS capsule   Oral   Take 1,000 Units by mouth daily.           . metoprolol succinate (TOPROL-XL) 25 MG 24 hr tablet   Oral   Take 1 tablet (25 mg total) by mouth daily.   30 tablet   5   . Multiple Vitamin (MULTIVITAMIN) capsule   Oral   Take 1 capsule by mouth daily.           . nitroGLYCERIN (NITROSTAT) 0.4 MG SL tablet   Sublingual   Place 1 tablet (0.4 mg total) under the tongue every 5 (five) minutes as needed.   25 tablet   1   . pantoprazole (PROTONIX) 40 MG tablet      TAKE 1 TABLET AT 6 MORNING   30 tablet   5   . Probiotic Product (PROBIOTIC DAILY PO)   Oral   Take 1 tablet by mouth daily.         . Rivaroxaban (XARELTO) 20 MG TABS tablet   Oral   Take 1 tablet (20 mg total) by mouth daily with supper.   90 tablet   0   . traMADol (ULTRAM) 50 MG tablet   Oral   Take 1 tablet (50 mg total) by mouth every 6 (six) hours as needed for pain.   30 tablet   6   . vitamin B-12 (CYANOCOBALAMIN) 1000 MCG tablet   Oral   Take 1,000 mcg by mouth daily.          BP 174/79  Pulse 122   Temp(Src) 98 F (36.7 C) (Oral)  Resp 16  Ht 5\' 10"  (1.778 m)  Wt 220 lb (99.791 kg)  BMI 31.57 kg/m2  SpO2 94% Physical Exam  Constitutional: He is oriented to person, place, and time. He appears well-developed and well-nourished.  HENT:  Head: Normocephalic and atraumatic.  Right Ear: External ear normal.  Left Ear: External ear normal.  +nasal erythema, rhinorrhea bilaterally, + post oropharyngeal erythema    Eyes: Conjunctivae are normal. Pupils are equal, round, and reactive to light.  Neck: Normal range of motion. Neck supple.  Cardiovascular: Normal rate and regular rhythm.   Pulmonary/Chest: Effort normal and breath sounds normal.  Abdominal: Soft.  Musculoskeletal: Normal range of motion.  Neurological: He is alert and oriented to person, place, and time.  Skin: Skin is warm.    ED Course  Procedures (including critical care time) Labs Review Labs Reviewed - No data to display Imaging Review No results found.  EKG Interpretation    Date/Time:    Ventricular Rate:    PR Interval:    QRS Duration:   QT Interval:    QTC Calculation:   R Axis:     Text Interpretation:              MDM   1. URI (upper respiratory infection)    Suspect likely viral source of sxs.  Discussed supportive care and infectious/resp red flags.  Zpak if sxs worsen/fail to improve.  Follow up as needed.     The patient and/or caregiver has been counseled thoroughly with regard to treatment plan and/or medications prescribed including dosage, schedule, interactions, rationale for use, and possible side effects and they verbalize understanding. Diagnoses and expected course of recovery discussed and will return if not improved as expected or if the condition worsens. Patient and/or caregiver verbalized understanding.       r  Shanda Howells, MD 12/16/13 1042

## 2013-12-22 ENCOUNTER — Other Ambulatory Visit: Payer: Self-pay | Admitting: Family Medicine

## 2013-12-30 ENCOUNTER — Other Ambulatory Visit: Payer: Self-pay | Admitting: *Deleted

## 2013-12-30 DIAGNOSIS — I4891 Unspecified atrial fibrillation: Secondary | ICD-10-CM

## 2013-12-30 MED ORDER — ATORVASTATIN CALCIUM 20 MG PO TABS: 10.0000 mg | ORAL_TABLET | Freq: Every day | ORAL | Status: DC

## 2013-12-30 MED ORDER — RIVAROXABAN 20 MG PO TABS: 20.0000 mg | ORAL_TABLET | Freq: Every day | ORAL | Status: DC

## 2014-01-04 ENCOUNTER — Other Ambulatory Visit: Payer: Self-pay | Admitting: Cardiology

## 2014-01-04 ENCOUNTER — Other Ambulatory Visit: Payer: Self-pay

## 2014-01-04 MED ORDER — METOPROLOL SUCCINATE ER 25 MG PO TB24: 25.0000 mg | ORAL_TABLET | Freq: Every day | ORAL | Status: DC

## 2014-01-10 ENCOUNTER — Other Ambulatory Visit: Payer: Self-pay | Admitting: *Deleted

## 2014-01-10 ENCOUNTER — Telehealth: Payer: Self-pay | Admitting: *Deleted

## 2014-01-10 DIAGNOSIS — I4891 Unspecified atrial fibrillation: Secondary | ICD-10-CM

## 2014-01-10 MED ORDER — ATORVASTATIN CALCIUM 20 MG PO TABS: 10.0000 mg | ORAL_TABLET | Freq: Every day | ORAL | Status: DC

## 2014-01-10 MED ORDER — RIVAROXABAN 20 MG PO TABS: 20.0000 mg | ORAL_TABLET | Freq: Every day | ORAL | Status: DC

## 2014-01-11 NOTE — Telephone Encounter (Signed)
Atorvastatin does not need PA

## 2014-01-11 NOTE — Telephone Encounter (Signed)
PA sent to [facility] for xarelto

## 2014-01-11 NOTE — Telephone Encounter (Signed)
[facility] approved xarelto through 01/10/2015 with PA # [id number], notified patient via answering machine.

## 2014-01-12 ENCOUNTER — Other Ambulatory Visit: Payer: Self-pay | Admitting: Cardiology

## 2014-01-13 ENCOUNTER — Other Ambulatory Visit: Payer: Self-pay

## 2014-01-13 ENCOUNTER — Telehealth: Payer: Self-pay | Admitting: *Deleted

## 2014-01-13 MED ORDER — BENAZEPRIL HCL 40 MG PO TABS: 40.0000 mg | ORAL_TABLET | Freq: Every day | ORAL | Status: DC

## 2014-01-13 NOTE — Telephone Encounter (Signed)
15 days xarelto samples until patient mail order comes in.

## 2014-01-18 ENCOUNTER — Other Ambulatory Visit: Payer: Self-pay | Admitting: Cardiology

## 2014-01-20 ENCOUNTER — Other Ambulatory Visit: Payer: Self-pay

## 2014-01-20 MED ORDER — AMLODIPINE BESYLATE 2.5 MG PO TABS: 2.5000 mg | ORAL_TABLET | Freq: Every day | ORAL | Status: DC

## 2014-01-20 NOTE — Telephone Encounter (Signed)
Amlodipine script was accidentally sent to [facility] and it should not have been. I called [facility] and spoke to [facility] and she cancelled this. Amlodipine was then correctly sent to [facility] in [place].

## 2014-01-27 ENCOUNTER — Other Ambulatory Visit: Payer: Self-pay | Admitting: Cardiology

## 2014-01-29 ENCOUNTER — Other Ambulatory Visit: Payer: Self-pay | Admitting: Internal Medicine

## 2014-02-14 ENCOUNTER — Ambulatory Visit (INDEPENDENT_AMBULATORY_CARE_PROVIDER_SITE_OTHER): Payer: Medicare Other | Admitting: Internal Medicine

## 2014-02-14 ENCOUNTER — Encounter: Payer: Self-pay | Admitting: Internal Medicine

## 2014-02-14 VITALS — BP 108/72 | HR 56 | Temp 97.3°F | Wt 228.8 lb

## 2014-02-14 DIAGNOSIS — G5600 Carpal tunnel syndrome, unspecified upper limb: Secondary | ICD-10-CM

## 2014-02-14 NOTE — Progress Notes (Signed)
Subjective:    Patient ID: [name], male    DOB: Mar 06, 1931, 80 y.o.   MRN: [id number]  HPI [name] presents for medical follow up. He reports that he is having increased pain in the right hand. He will have paresthesia during night. He has never been evaluated for carpal tunnel but he did have a major fracture right wrist many years ago.  Past Medical History  Diagnosis Date  . CAD (coronary artery disease)     s/p CABG 1993 (L-LAD, S-RI, S-D1);   echo 1/10: EF 55%;    myoview 12/09: inf MI, no ischemia  . Hyperlipidemia   . HTN (hypertension)   . Folliculitis   . Obesity   . Osteoarthritis   . Irritable bladder   . Sleep disorder   . Family history of malignant neoplasm of gastrointestinal tract   . Myocardial infarction   . Candida esophagitis 2013    EGD   . Antral gastritis 2013    EGD   . Hiatal hernia   . Atrial fibrillation    Past Surgical History  Procedure Laterality Date  . Coronary artery bypass graft      graft '92: LIMA-LAD, SVG - D2,R1, D1  . Cholecystectomy      laparoscopic '92  . Mole removal  2001 and 2008  . Pilonidal cyst excision    . Enteroscopy  08/22/2012    Procedure: ENTEROSCOPY;  Surgeon: Juanita Craver, MD;  Location: [facility];  Service: Endoscopy;  Laterality: N/A;   Family History  Problem Relation Age of Onset  . Coronary artery disease Father     and brother  . Heart attack Father     and brother-fatal  . Stroke Father   . Breast cancer Mother   . Colon cancer Maternal Uncle   . Esophageal cancer Neg Hx   . Stomach cancer Neg Hx   . Rectal cancer Neg Hx    History   Social History  . Marital Status: Widowed    Spouse Name: N/A    Number of Children: 1  . Years of Education: N/A   Occupational History  . Retired    Social History Main Topics  . Smoking status: Former Smoker -- 16 years    Quit date: 08/14/1964  . Smokeless tobacco: Never Used     Comment: quit 1965  . Alcohol Use: No  . Drug Use: No  . Sexual  Activity: Not on file   Other Topics Concern  . 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 Married '57- widowed 03-27-2023. 1 son -'41- died '76 MVA. Retired: keeps himself busy. ACP - not reviewed (Jan '13)    Current Outpatient Prescriptions on File Prior to Visit  Medication Sig Dispense Refill  . amLODipine (NORVASC) 2.5 MG tablet Take 1 tablet (2.5 mg total) by mouth daily.  30 tablet  5  . atorvastatin (LIPITOR) 20 MG tablet Take 0.5 tablets (10 mg total) by mouth daily.  45 tablet  0  . atorvastatin (LIPITOR) 20 MG tablet Take 0.5 tablets (10 mg total) by mouth daily.  7 tablet  0  . azithromycin (ZITHROMAX) 250 MG tablet Take 2 tabs PO x 1 dose, then 1 tab PO QD x 4 days  6 tablet  0  . benazepril (LOTENSIN) 40 MG tablet Take 1 tablet (40 mg total) by mouth daily.  30 tablet  11  . Cholecalciferol (VITAMIN D) 1000 UNITS  capsule Take 1,000 Units by mouth daily.        . metoprolol succinate (TOPROL-XL) 25 MG 24 hr tablet Take 1 tablet (25 mg total) by mouth daily.  30 tablet  5  . Multiple Vitamin (MULTIVITAMIN) capsule Take 1 capsule by mouth daily.        Marland Kitchen NITROSTAT 0.4 MG SL tablet USE AS DIRECTED  25 tablet  3  . pantoprazole (PROTONIX) 40 MG tablet TAKE 1 TABLET AT 6 MORNING  30 tablet  5  . Probiotic Product (PROBIOTIC DAILY PO) Take 1 tablet by mouth daily.      . Rivaroxaban (XARELTO) 20 MG TABS tablet Take 1 tablet (20 mg total) by mouth daily with supper.  90 tablet  0  . Rivaroxaban (XARELTO) 20 MG TABS tablet Take 1 tablet (20 mg total) by mouth daily with supper.  15 tablet  0  . traMADol (ULTRAM) 50 MG tablet Take 1 tablet (50 mg total) by mouth every 6 (six) hours as needed for pain.  30 tablet  6  . vitamin B-12 (CYANOCOBALAMIN) 1000 MCG tablet Take 1,000 mcg by mouth daily.       No current facility-administered medications on file prior to visit.      Review of Systems System review is negative for any constitutional, cardiac, pulmonary, GI or  neuro symptoms or complaints other than as described in the HPI.     Objective:   Physical Exam Filed Vitals:   02/14/14 0803  BP: 108/72  Pulse: 56  Temp: 97.3 F (36.3 C)   Wt Readings from Last 3 Encounters:  02/14/14 228 lb 12.8 oz (103.783 kg)  12/13/13 222 lb 8 oz (100.925 kg)  12/09/13 220 lb (99.791 kg)   Gen'l- older man in no distress HEENT - C&S clear Cor 2+ radial pulse Pulm - normal respirations Neuro - positive Tinel's, positive Phalen's. Ext - hands are hard used but w/o deformity.       Assessment & Plan:

## 2014-02-14 NOTE — Progress Notes (Signed)
Pre visit review using our clinic review tool, if applicable. No additional management support is needed unless otherwise documented below in the visit note. 

## 2014-02-14 NOTE — Patient Instructions (Signed)
Thanks for coming in. You have carpal tunnel right hand. The cock up wrist splint will help. OIf the symptoms get worse you should have a nerve conduction study to see if you will need to have surgery to preserve function of the right hand.   Moving forward: I recommend Kathlene November, MD at [facility] on [facility].   Carpal Tunnel Syndrome The carpal tunnel is a narrow area located on the palm side of your wrist. The tunnel is formed by the wrist bones and ligaments. Nerves, blood vessels, and tendons pass through the carpal tunnel. Repeated wrist motion or certain diseases may cause swelling within the tunnel. This swelling pinches the main nerve in the wrist (median nerve) and causes the painful hand and arm condition called carpal tunnel syndrome. CAUSES   Repeated wrist motions.  Wrist injuries.  Certain diseases like arthritis, diabetes, alcoholism, hyperthyroidism, and kidney failure.  Obesity.  Pregnancy. SYMPTOMS   A "pins and needles" feeling in your fingers or hand.  Tingling or numbness in your fingers or hand.  An aching feeling in your entire arm.  Wrist pain that goes up your arm to your shoulder.  Pain that goes down into your palm or fingers.  A weak feeling in your hands. DIAGNOSIS  Your caregiver will take your history and perform a physical exam. An electromyography test may be needed. This test measures electrical signals sent out by the muscles. The electrical signals are usually slowed by carpal tunnel syndrome. You may also need X-rays. TREATMENT  Carpal tunnel syndrome may clear up by itself. Your caregiver may recommend a wrist splint or medicine such as a nonsteroidal anti-inflammatory medicine. Cortisone injections may help. Sometimes, surgery may be needed to free the pinched nerve.  HOME CARE INSTRUCTIONS   Take all medicine as directed by your caregiver. Only take over-the-counter or prescription medicines for pain, discomfort, or fever as directed by your  caregiver.  If you were given a splint to keep your wrist from bending, wear it as directed. It is important to wear the splint at night. Wear the splint for as long as you have pain or numbness in your hand, arm, or wrist. This may take 1 to 2 months.  Rest your wrist from any activity that may be causing your pain. If your symptoms are work-related, you may need to talk to your employer about changing to a job that does not require using your wrist.  Put ice on your wrist after long periods of wrist activity.  Put ice in a plastic bag.  Place a towel between your skin and the bag.  Leave the ice on for 15-20 minutes, 03-04 times a day.  Keep all follow-up visits as directed by your caregiver. This includes any orthopedic referrals, physical therapy, and rehabilitation. Any delay in getting necessary care could result in a delay or failure of your condition to heal. SEEK IMMEDIATE MEDICAL CARE IF:   You have new, unexplained symptoms.  Your symptoms get worse and are not helped or controlled with medicines. MAKE SURE YOU:   Understand these instructions.  Will watch your condition.  Will get help right away if you are not doing well or get worse. Document Released: 11/22/2000 Document Revised: 02/17/2012 Document Reviewed: 10/11/2011 [facility] Patient Information 2014 [place], [place].

## 2014-02-15 DIAGNOSIS — G5600 Carpal tunnel syndrome, unspecified upper limb: Secondary | ICD-10-CM | POA: Insufficient documentation

## 2014-02-15 NOTE — Assessment & Plan Note (Signed)
March '15 - hand pain worse in the AM along with paresthesia. Positive Phalen's and Tinel's. He has maintained good function: not dropping objects, able to do his work. Has not been awakened by pain.  Plan Patient education (AVS)  Cock up wrist splint-right  For progressive symptoms - NCS and referral to hand surgeon

## 2014-02-18 ENCOUNTER — Emergency Department
Admission: EM | Admit: 2014-02-18 | Discharge: 2014-02-18 | Disposition: A | Discharge status: Home / Self Care | Payer: Medicare Other | Source: Home / Self Care | Attending: Emergency Medicine | Admitting: Emergency Medicine

## 2014-02-18 ENCOUNTER — Encounter: Payer: Self-pay | Admitting: Emergency Medicine

## 2014-02-18 DIAGNOSIS — R509 Fever, unspecified: Secondary | ICD-10-CM

## 2014-02-18 DIAGNOSIS — J069 Acute upper respiratory infection, unspecified: Secondary | ICD-10-CM

## 2014-02-18 LAB — POCT INFLUENZA A/B
Influenza A, POC: NEGATIVE
Influenza B, POC: NEGATIVE

## 2014-02-18 MED ORDER — AZITHROMYCIN 250 MG PO TABS: ORAL_TABLET | ORAL | Status: DC

## 2014-02-18 NOTE — ED Notes (Signed)
Destry complains of fevers, chills, body aches, dizziness, sore throat, runny nose, sneezing and hoarseness for 1 day.

## 2014-02-18 NOTE — ED Provider Notes (Signed)
CSN: [id number]     Arrival date & time 02/18/14  1843 History   First MD Initiated Contact with Patient 02/18/14 1848     Chief Complaint  Patient presents with  . Fever    x 1 day  . Sore Throat    x 1 day   (Consider location/radiation/quality/duration/timing/severity/associated sxs/prior Treatment) HPI [name] is a 80 y.o. male who complains of onset of cold symptoms for 1 days.  The symptoms are constant and mild in severity.  He was working in the house yesterday cleaning out and there were some dusty things around him.  No known sick contacts. + sore throat +/- cough No pleuritic pain No wheezing + nasal congestion + post-nasal drainage + sinus pain/pressure No chest congestion + itchy/red eyes + earache No hemoptysis No SOB + fever No nausea No vomiting No abdominal pain No diarrhea No skin rashes No fatigue No myalgias + headache     Past Medical History  Diagnosis Date  . CAD (coronary artery disease)     s/p CABG 1993 (L-LAD, S-RI, S-D1);   echo 1/10: EF 55%;    myoview 12/09: inf MI, no ischemia  . Hyperlipidemia   . HTN (hypertension)   . Folliculitis   . Obesity   . Osteoarthritis   . Irritable bladder   . Sleep disorder   . Family history of malignant neoplasm of gastrointestinal tract   . Myocardial infarction   . Candida esophagitis 2013    EGD   . Antral gastritis 2013    EGD   . Hiatal hernia   . Atrial fibrillation    Past Surgical History  Procedure Laterality Date  . Coronary artery bypass graft      graft '92: LIMA-LAD, SVG - D2,R1, D1  . Cholecystectomy      laparoscopic '92  . Mole removal  2001 and 2008  . Pilonidal cyst excision    . Enteroscopy  08/22/2012    Procedure: ENTEROSCOPY;  Surgeon: Juanita Craver, MD;  Location: [facility];  Service: Endoscopy;  Laterality: N/A;   Family History  Problem Relation Age of Onset  . Coronary artery disease Father     and brother  . Heart attack Father     and brother-fatal  .  Stroke Father   . Breast cancer Mother   . Colon cancer Maternal Uncle   . Esophageal cancer Neg Hx   . Stomach cancer Neg Hx   . Rectal cancer Neg Hx    History  Substance Use Topics  . Smoking status: Former Smoker -- 16 years    Quit date: 08/14/1964  . Smokeless tobacco: Never Used     Comment: quit 1965  . Alcohol Use: No    Review of Systems  Allergies  Review of patient's allergies indicates no known allergies.  Home Medications   Current Outpatient Rx  Name  Route  Sig  Dispense  Refill  . amLODipine (NORVASC) 2.5 MG tablet   Oral   Take 1 tablet (2.5 mg total) by mouth daily.   30 tablet   5   . atorvastatin (LIPITOR) 20 MG tablet   Oral   Take 0.5 tablets (10 mg total) by mouth daily.   45 tablet   0   . atorvastatin (LIPITOR) 20 MG tablet   Oral   Take 0.5 tablets (10 mg total) by mouth daily.   7 tablet   0   . benazepril (LOTENSIN) 40 MG tablet   Oral  Take 1 tablet (40 mg total) by mouth daily.   30 tablet   11   . Cholecalciferol (VITAMIN D) 1000 UNITS capsule   Oral   Take 1,000 Units by mouth daily.           . metoprolol succinate (TOPROL-XL) 25 MG 24 hr tablet   Oral   Take 1 tablet (25 mg total) by mouth daily.   30 tablet   5   . Multiple Vitamin (MULTIVITAMIN) capsule   Oral   Take 1 capsule by mouth daily.           Marland Kitchen NITROSTAT 0.4 MG SL tablet      USE AS DIRECTED   25 tablet   3   . pantoprazole (PROTONIX) 40 MG tablet      TAKE 1 TABLET AT 6 MORNING   30 tablet   5   . Probiotic Product (PROBIOTIC DAILY PO)   Oral   Take 1 tablet by mouth daily.         . Rivaroxaban (XARELTO) 20 MG TABS tablet   Oral   Take 1 tablet (20 mg total) by mouth daily with supper.   15 tablet   0   . traMADol (ULTRAM) 50 MG tablet   Oral   Take 1 tablet (50 mg total) by mouth every 6 (six) hours as needed for pain.   30 tablet   6   . vitamin B-12 (CYANOCOBALAMIN) 1000 MCG tablet   Oral   Take 1,000 mcg by mouth  daily.         Marland Kitchen azithromycin (ZITHROMAX Z-PAK) 250 MG tablet      Use as directed   1 each   0   . azithromycin (ZITHROMAX) 250 MG tablet      Take 2 tabs PO x 1 dose, then 1 tab PO QD x 4 days   6 tablet   0   . Rivaroxaban (XARELTO) 20 MG TABS tablet   Oral   Take 1 tablet (20 mg total) by mouth daily with supper.   90 tablet   0    BP 157/83  Pulse 90  Temp(Src) 100.5 F (38.1 C) (Oral)  Ht 5' 10.5" (1.791 m)  Wt 221 lb (100.245 kg)  BMI 31.25 kg/m2  SpO2 96% Physical Exam  ED Course  Procedures (including critical care time) Labs Review Labs Reviewed  POCT INFLUENZA A/B - Normal   Imaging Review No results found.   MDM   1. Acute upper respiratory infections of unspecified site   2. Fever    1)  most likely is a viral syndrome.  Flu test is negative.  However with his low-grade fever and his age and comorbidities, I'm going to place him on an antibiotic as.  Should followup with his PCP.  I do not suspect any cardiac process for this. 2)  Use nasal saline solution (over the counter) at least 3 times a day.  3)  Can take tylenol every 6 hours  for pain or fever. 4)  Follow up with your primary doctor if no improvement in 5-7 days, sooner if increasing pain, fever, or new symptoms.     Janeann Forehand, MD 02/18/14 [phone]

## 2014-03-25 ENCOUNTER — Emergency Department
Admission: EM | Admit: 2014-03-25 | Discharge: 2014-03-25 | Disposition: A | Discharge status: Home / Self Care | Payer: Medicare Other | Source: Home / Self Care | Attending: Family Medicine | Admitting: Family Medicine

## 2014-03-25 ENCOUNTER — Encounter: Payer: Self-pay | Admitting: Emergency Medicine

## 2014-03-25 DIAGNOSIS — S058X9A Other injuries of unspecified eye and orbit, initial encounter: Secondary | ICD-10-CM

## 2014-03-25 DIAGNOSIS — S0501XA Injury of conjunctiva and corneal abrasion without foreign body, right eye, initial encounter: Secondary | ICD-10-CM

## 2014-03-25 MED ORDER — SULFACETAMIDE SODIUM 10 % OP SOLN: 1.0000 [drp] | OPHTHALMIC | Status: DC

## 2014-03-25 NOTE — Discharge Instructions (Signed)
Wear sun glasses when outside.   Corneal Abrasion The cornea is the clear covering at the front and center of the eye. When looking at the colored portion of the eye (iris), you are looking through the cornea. This very thin tissue is made up of many layers. The surface layer is a single layer of cells (corneal epithelium) and is one of the most sensitive tissues in the body. If a scratch or injury causes the corneal epithelium to come off, it is called a corneal abrasion. If the injury extends to the tissues below the epithelium, the condition is called a corneal ulcer. CAUSES   Scratches.  Trauma.  Foreign body in the eye. Some people have recurrences of abrasions in the area of the original injury even after it has healed (recurrent erosion syndrome). Recurrent erosion syndrome generally improves and goes away with time. SYMPTOMS   Eye pain.  Difficulty or inability to keep the injured eye open.  The eye becomes very sensitive to light.  Recurrent erosions tend to happen suddenly, first thing in the morning, usually after waking up and opening the eye. DIAGNOSIS  Your health care provider can diagnose a corneal abrasion during an eye exam. Dye is usually placed in the eye using a drop or a small paper strip moistened by your tears. When the eye is examined with a special light, the abrasion shows up clearly because of the dye. TREATMENT   Small abrasions may be treated with antibiotic drops or ointment alone.    Most corneal abrasions heal within 2 3 days with no effect on vision. If the abrasion becomes infected and spreads to the deeper tissues of the cornea, a corneal ulcer can result. This is serious because it can cause corneal scarring. Corneal scars interfere with light passing through the cornea and cause a loss of vision in the involved eye. HOME CARE INSTRUCTIONS  Use medicine or ointment as directed. Only take over-the-counter or prescription medicines for pain,  discomfort, or fever as directed by your health care provider.  If your health care provider has given you a follow-up appointment, it is very important to keep that appointment. Not keeping the appointment could result in a severe eye infection or permanent loss of vision. If there is any problem keeping the appointment, let your health care provider know. SEEK MEDICAL CARE IF:   You have pain, light sensitivity, and a scratchy feeling in one eye or both eyes.   Any kind of discharge develops from the eye after treatment or if the lids stick together in the morning.  You have the same symptoms in the morning as you did with the original abrasion days, weeks, or months after the abrasion healed. MAKE SURE YOU:   Understand these instructions.  Will watch your condition.  Will get help right away if you are not doing well or get worse. Document Released: 11/22/2000 Document Revised: 09/15/2013 Document Reviewed: 08/02/2013 [facility] Patient Information 2014 [place].

## 2014-03-25 NOTE — ED Provider Notes (Signed)
CSN: [id number]     Arrival date & time 03/25/14  1928 History   First MD Initiated Contact with Patient 03/25/14 1943     Chief Complaint  Patient presents with  . Eye Problem    right      HPI Comments: The patient was playing golf today.  During one swing of his golf club dirt and debris entered his right eye.  He lavaged the eye but has had persistent foreign body sensation.  Patient is a 80 y.o. male presenting with foreign body in eye. The history is provided by the patient and the spouse.  Foreign Body in Eye This is a new problem. The current episode started 6 to 12 hours ago. The problem occurs constantly. The problem has not changed since onset.Associated symptoms comments: No blurred vision. Exacerbated by: blinking. Nothing relieves the symptoms. Treatments tried: eye lavage. The treatment provided no relief.    Past Medical History  Diagnosis Date  . CAD (coronary artery disease)     s/p CABG 1993 (L-LAD, S-RI, S-D1);   echo 1/10: EF 55%;    myoview 12/09: inf MI, no ischemia  . Hyperlipidemia   . HTN (hypertension)   . Folliculitis   . Obesity   . Osteoarthritis   . Irritable bladder   . Sleep disorder   . Family history of malignant neoplasm of gastrointestinal tract   . Myocardial infarction   . Candida esophagitis 2013    EGD   . Antral gastritis 2013    EGD   . Hiatal hernia   . Atrial fibrillation    Past Surgical History  Procedure Laterality Date  . Coronary artery bypass graft      graft '92: LIMA-LAD, SVG - D2,R1, D1  . Cholecystectomy      laparoscopic '92  . Mole removal  2001 and 2008  . Pilonidal cyst excision    . Enteroscopy  08/22/2012    Procedure: ENTEROSCOPY;  Surgeon: Juanita Craver, MD;  Location: [facility];  Service: Endoscopy;  Laterality: N/A;   Family History  Problem Relation Age of Onset  . Coronary artery disease Father     and brother  . Heart attack Father     and brother-fatal  . Stroke Father   . Breast cancer Mother     . Colon cancer Maternal Uncle   . Esophageal cancer Neg Hx   . Stomach cancer Neg Hx   . Rectal cancer Neg Hx    History  Substance Use Topics  . Smoking status: Former Smoker -- 16 years    Quit date: 08/14/1964  . Smokeless tobacco: Never Used     Comment: quit 1965  . Alcohol Use: No    Review of Systems  All other systems reviewed and are negative.   Allergies  Review of patient's allergies indicates no known allergies.  Home Medications   Prior to Admission medications   Medication Sig Start Date End Date Taking? Authorizing Provider  amLODipine (NORVASC) 2.5 MG tablet Take 1 tablet (2.5 mg total) by mouth daily. 01/20/14   Neena Rhymes, MD  atorvastatin (LIPITOR) 20 MG tablet Take 0.5 tablets (10 mg total) by mouth daily. 01/10/14   Lelon Perla, MD  benazepril (LOTENSIN) 40 MG tablet Take 1 tablet (40 mg total) by mouth daily. 01/13/14   Neena Rhymes, MD  Cholecalciferol (VITAMIN D) 1000 UNITS capsule Take 1,000 Units by mouth daily.      Historical Provider, MD  metoprolol succinate (  TOPROL-XL) 25 MG 24 hr tablet Take 1 tablet (25 mg total) by mouth daily. 01/04/14   Lelon Perla, MD  Multiple Vitamin (MULTIVITAMIN) capsule Take 1 capsule by mouth daily.      Historical Provider, MD  NITROSTAT 0.4 MG SL tablet USE AS DIRECTED    Lelon Perla, MD  pantoprazole (PROTONIX) 40 MG tablet TAKE 1 TABLET AT 6 MORNING    Neena Rhymes, MD  Probiotic Product (PROBIOTIC DAILY PO) Take 1 tablet by mouth daily.    Historical Provider, MD  Rivaroxaban (XARELTO) 20 MG TABS tablet Take 1 tablet (20 mg total) by mouth daily with supper. 01/10/14   Lelon Perla, MD  traMADol (ULTRAM) 50 MG tablet Take 1 tablet (50 mg total) by mouth every 6 (six) hours as needed for pain. 09/01/12   Sable Feil, MD  vitamin B-12 (CYANOCOBALAMIN) 1000 MCG tablet Take 1,000 mcg by mouth daily.    Historical Provider, MD   BP 167/83  Pulse 82  Temp(Src) 98.3 F (36.8 C) (Oral)   Resp 14  Wt 223 lb (101.152 kg)  SpO2 95% Physical Exam  Nursing note and vitals reviewed. Constitutional: He appears well-developed and well-nourished. No distress.  Patient is obese   HENT:  Head: Atraumatic.  Mouth/Throat: Oropharynx is clear and moist.  Eyes: EOM and lids are normal. Pupils are equal, round, and reactive to light. Lids are everted and swept, no foreign bodies found. Right eye exhibits no discharge. Left eye exhibits no discharge. Right conjunctiva is injected. Right conjunctiva has no hemorrhage. Left conjunctiva is not injected.    No photophobia.  Right lid eversion negative.  Fluorescein to right eye reveals faint diffuse uptake lower cornea, with a "ground glass" appearance.  Neck: Neck supple.  Cardiovascular: Normal heart sounds.   Pulmonary/Chest: Breath sounds normal.    ED Course  Procedures  none           MDM   1. Right corneal abrasion    Begin sulfacetamide ophth suspension Q3hr.  Followup with ophthalmologist if not improving 3 days.    Kandra Nicolas, MD 03/28/14 2250

## 2014-03-25 NOTE — ED Notes (Signed)
[name] was playing golf today, when swinging dirt and debris flew into his right eye. Washed out @ home but still having irritation and redness.

## 2014-03-30 ENCOUNTER — Encounter: Payer: Self-pay | Admitting: Internal Medicine

## 2014-04-04 ENCOUNTER — Telehealth: Payer: Self-pay

## 2014-04-04 NOTE — Telephone Encounter (Signed)
Left message for call back Non identifiable  Flu vaccine---2013 Tdap--03/2012 PNA--12/2011 [facility]--Dr Patterson--no further recommended due to age.

## 2014-04-05 ENCOUNTER — Ambulatory Visit (INDEPENDENT_AMBULATORY_CARE_PROVIDER_SITE_OTHER): Payer: Medicare Other | Admitting: Internal Medicine

## 2014-04-05 ENCOUNTER — Encounter: Payer: Self-pay | Admitting: Internal Medicine

## 2014-04-05 VITALS — BP 130/78 | HR 60 | Temp 98.2°F | Ht 70.4 in | Wt 225.0 lb

## 2014-04-05 DIAGNOSIS — E119 Type 2 diabetes mellitus without complications: Secondary | ICD-10-CM

## 2014-04-05 DIAGNOSIS — E785 Hyperlipidemia, unspecified: Secondary | ICD-10-CM

## 2014-04-05 DIAGNOSIS — Z23 Encounter for immunization: Secondary | ICD-10-CM

## 2014-04-05 DIAGNOSIS — D649 Anemia, unspecified: Secondary | ICD-10-CM

## 2014-04-05 DIAGNOSIS — Z0000 Encounter for general adult medical examination without abnormal findings: Secondary | ICD-10-CM

## 2014-04-05 DIAGNOSIS — M1990 Unspecified osteoarthritis, unspecified site: Secondary | ICD-10-CM

## 2014-04-05 DIAGNOSIS — I10 Essential (primary) hypertension: Secondary | ICD-10-CM

## 2014-04-05 NOTE — Progress Notes (Signed)
Pre visit review using our clinic review tool, if applicable. No additional management support is needed unless otherwise documented below in the visit note. 

## 2014-04-05 NOTE — Assessment & Plan Note (Signed)
On ultram as needed, usually takes it when he goes to play golf

## 2014-04-05 NOTE — Assessment & Plan Note (Signed)
Iron deficiency anemia in 2013, status post GI w/u  Plan: Labs

## 2014-04-05 NOTE — Assessment & Plan Note (Signed)
seems well-controlled, no change, check a BMP

## 2014-04-05 NOTE — Assessment & Plan Note (Signed)
Asked patient to come back to the office fasting, continue with Lipitor for now, check LFTs

## 2014-04-05 NOTE — Assessment & Plan Note (Addendum)
Td 2013 S/p PNM shot  prevnar today zostavax discussed  Cscope 02-2012 (-)  psa 2009 normal, DRE normal today, no sx, discussed w/ pt , agreed on not to pursue further prostate ca screening  Stool incontinence? Sx c\x > 1 year, DRE w/ normal rectal tone, rec observation for now, reassess next year

## 2014-04-05 NOTE — Assessment & Plan Note (Signed)
Due for labs

## 2014-04-05 NOTE — Progress Notes (Signed)
Subjective:    Patient ID: [name], male    DOB: [birth year], 80 y.o.   MRN: [id number]  DOS:  04/05/2014 Type of  visit: New pt to me. Here for Medicare AWV:  1. Risk factors based on Past M, S, F history: reviewed 2. Physical Activities:  [place], golf 3/week, dancing 3. Depression/mood: neg screening  4. Hearing: No problemss noted or reported  5. ADL's:  completely independent, drives  6. Fall Risk: no recent falls, see instructions  7. home Safety: does feel safe at home  8. Height, weight, & visual acuity: see VS, sees eye MD regulalrly  9. Counseling: provided 10. Labs ordered based on risk factors: if needed  11. Referral Coordination: if needed 12. Care Plan, see assessment and plan  13. Cognitive Assessment: seems appropriate for age   In addition, today we discussed the following: Hypertension, good medication compliance, ambulatory BPs usually in the 130s. Cardiovascular---last visit with cardiology 11/2013, note reviewed, felt to be stable. History of anemia, multiple tests done per GI 2013. > 1 year h/o what seems to be stool incontinence, he sometimes "soil his underwear" without even noticing. Denies rectal pain, back pain or bladder incontinence. His with DJD, on Ultram when necessary, usually when he plays golf. High cholesterol, good medication compliance. H/o mild DM, labs reviewed, due for a A1C  ROS  no chest pain or difficulty breathing, occasional palpitation. No nausea, vomiting, diarrhea or blood in the stools. No abdominal pain. No dysuria, gross hematuria and difficulty urinating. No headache or dizziness  Past Medical History  Diagnosis Date  . CAD (coronary artery disease)     s/p CABG 1993 (L-LAD, S-RI, S-D1);   echo 1/10: EF 55%;    myoview 12/09: inf MI, no ischemia  . Hyperlipidemia   . HTN (hypertension)   . Folliculitis   . Obesity   . Osteoarthritis   . Irritable bladder   . Family history of malignant neoplasm of  gastrointestinal tract   . Myocardial infarction   . Candida esophagitis 2013    EGD   . Antral gastritis 2013    EGD   . Hiatal hernia   . Atrial fibrillation   . Anemia     Past Surgical History  Procedure Laterality Date  . Coronary artery bypass graft      graft '92: LIMA-LAD, SVG - D2,R1, D1  . Cholecystectomy      laparoscopic '92  . Mole removal  2001 and 2008  . Pilonidal cyst excision    . Enteroscopy  08/22/2012    Procedure: ENTEROSCOPY;  Surgeon: Juanita Craver, MD;  Location: [facility];  Service: Endoscopy;  Laterality: N/A;    History   Social History  . Marital Status: Widowed    Spouse Name: N/A    Number of Children: 1  . Years of Education: N/A   Occupational History  . Retired    Social History Main Topics  . Smoking status: Former Smoker -- 16 years    Quit date: 08/14/1964  . Smokeless tobacco: Never Used     Comment: quit 1965  . Alcohol Use: No  . Drug Use: No  . Sexual Activity: Not on file   Other Topics Concern  . 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 Married '57- widowed 2023/04/09. 1 son -'17- died '81 MVA.    Retired: keeps himself busy.    Lives by  himself     Family History  Problem Relation Age of Onset  . Coronary artery disease Father     and brother  . Heart attack Father     and brother-fatal  . Stroke Father   . Breast cancer Mother   . Colon cancer Maternal Uncle   . Esophageal cancer Neg Hx   . Stomach cancer Neg Hx   . Rectal cancer Neg Hx   . Prostate cancer Neg Hx        Medication List       This list is accurate as of: 04/05/14  5:26 PM.  Always use your most recent med list.               amLODipine 2.5 MG tablet  Commonly known as:  NORVASC  Take 1 tablet (2.5 mg total) by mouth daily.     atorvastatin 20 MG tablet  Commonly known as:  LIPITOR  Take 0.5 tablets (10 mg total) by mouth daily.     benazepril 40 MG tablet  Commonly known as:  LOTENSIN  Take 1 tablet (40 mg  total) by mouth daily.     metoprolol succinate 25 MG 24 hr tablet  Commonly known as:  TOPROL-XL  Take 1 tablet (25 mg total) by mouth daily.     multivitamin capsule  Take 1 capsule by mouth daily.     NITROSTAT 0.4 MG SL tablet  Generic drug:  nitroGLYCERIN  USE AS DIRECTED     pantoprazole 40 MG tablet  Commonly known as:  PROTONIX  TAKE 1 TABLET AT 6 MORNING     PROBIOTIC DAILY PO  Take 1 tablet by mouth daily.     rivaroxaban 20 MG Tabs tablet  Commonly known as:  XARELTO  Take 1 tablet (20 mg total) by mouth daily with supper.     sulfacetamide 10 % ophthalmic solution  Commonly known as:  BLEPH-10  Place 1-2 drops into the right eye every 3 (three) hours while awake.     traMADol 50 MG tablet  Commonly known as:  ULTRAM  Take 1 tablet (50 mg total) by mouth every 6 (six) hours as needed for pain.     vitamin B-12 1000 MCG tablet  Commonly known as:  CYANOCOBALAMIN  Take 1,000 mcg by mouth daily.     Vitamin D 1000 UNITS capsule  Take 1,000 Units by mouth daily.           Objective:   Physical Exam BP 130/78  Pulse 60  Temp(Src) 98.2 F (36.8 C)  Ht 5' 10.4" (1.788 m)  Wt 225 lb (102.059 kg)  BMI 31.92 kg/m2  SpO2 96% General -- alert, well-developed, NAD.  Neck --no thyromegaly , normal carotid pulse   ears-- wx R>L, partially removed wax from the r ear w/ a spoon Lungs -- normal respiratory effort, no intercostal retractions, no accessory muscle use, and normal breath sounds.  Heart-- irreg irregular.  Abdomen-- Not distended, good bowel sounds,soft, non-tender. Rectal-- No external abnormalities noted. Normal sphincter tone. No rectal masses or tenderness. Brown stool Prostate--Prostate gland firm and smooth, no enlargement, nodularity, tenderness, mass, asymmetry or induration. Extremities-- no pretibial edema bilaterally  Neurologic--  alert & oriented X3.   Psych-- Cognition and judgment appear intact. Cooperative with normal attention span  and concentration. No anxious or depressed appearing.        Assessment & Plan:    Wax ears-- use H2O2 or OTC alcohol, call if problems

## 2014-04-05 NOTE — Patient Instructions (Signed)
Get your blood work before you leave   Next visit is for routine check up regards your blood sugar , blood pressure  in 4 months, fasting Please make an appointment     Fall Prevention and [place] cause injuries and can affect all age groups. It is possible to use preventive measures to significantly decrease the likelihood of falls. There are many simple measures which can make your home safer and prevent falls. OUTDOORS  Repair cracks and edges of walkways and driveways.  Remove high doorway thresholds.  Trim shrubbery on the main path into your home.  Have good outside lighting.  Clear walkways of tools, rocks, debris, and clutter.  Check that handrails are not broken and are securely fastened. Both sides of steps should have handrails.  Have leaves, snow, and ice cleared regularly.  Use sand or salt on walkways during winter months.  In the garage, clean up grease or oil spills. BATHROOM  Install night lights.  Install grab bars by the toilet and in the tub and shower.  Use non-skid mats or decals in the tub or shower.  Place a plastic non-slip stool in the shower to sit on, if needed.  Keep floors dry and clean up all water on the floor immediately.  Remove soap buildup in the tub or shower on a regular basis.  Secure bath mats with non-slip, double-sided rug tape.  Remove throw rugs and tripping hazards from the floors. BEDROOMS  Install night lights.  Make sure a bedside light is easy to reach.  Do not use oversized bedding.  Keep a telephone by your bedside.  Have a firm chair with side arms to use for getting dressed.  Remove throw rugs and tripping hazards from the floor. KITCHEN  Keep handles on pots and pans turned toward the center of the stove. Use back burners when possible.  Clean up spills quickly and allow time for drying.  Avoid walking on wet floors.  Avoid hot utensils and knives.  Position shelves so they are not too  high or low.  Place commonly used objects within easy reach.  If necessary, use a sturdy step stool with a grab bar when reaching.  Keep electrical cables out of the way.  Do not use floor polish or wax that makes floors slippery. If you must use wax, use non-skid floor wax.  Remove throw rugs and tripping hazards from the floor. STAIRWAYS  Never leave objects on stairs.  Place handrails on both sides of stairways and use them. Fix any loose handrails. Make sure handrails on both sides of the stairways are as long as the stairs.  Check carpeting to make sure it is firmly attached along stairs. Make repairs to worn or loose carpet promptly.  Avoid placing throw rugs at the top or bottom of stairways, or properly secure the rug with carpet tape to prevent slippage. Get rid of throw rugs, if possible.  Have an electrician put in a light switch at the top and bottom of the stairs. OTHER FALL PREVENTION TIPS  Wear low-heel or rubber-soled shoes that are supportive and fit well. Wear closed toe shoes.  When using a stepladder, make sure it is fully opened and both spreaders are firmly locked. Do not climb a closed stepladder.  Add color or contrast paint or tape to grab bars and handrails in your home. Place contrasting color strips on first and last steps.  Learn and use mobility aids as needed. Install an Dealer  emergency response system.  Turn on lights to avoid dark areas. Replace light bulbs that burn out immediately. Get light switches that glow.  Arrange furniture to create clear pathways. Keep furniture in the same place.  Firmly attach carpet with non-skid or double-sided tape.  Eliminate uneven floor surfaces.  Select a carpet pattern that does not visually hide the edge of steps.  Be aware of all pets. OTHER HOME SAFETY TIPS  Set the water temperature for 120 F (48.8 C).  Keep emergency numbers on or near the telephone.  Keep smoke detectors on every level  of the home and near sleeping areas. Document Released: 11/15/2002 Document Revised: 05/26/2012 Document Reviewed: 02/14/2012 [facility] Patient Information 2014 [employer].

## 2014-04-06 LAB — BASIC METABOLIC PANEL
BUN: 16 mg/dL (ref 6–23)
CALCIUM: 9.7 mg/dL (ref 8.4–10.5)
CO2: 29 mEq/L (ref 19–32)
CREATININE: 1.3 mg/dL (ref 0.4–1.5)
Chloride: 104 mEq/L (ref 96–112)
GFR: 55.41 mL/min — ABNORMAL LOW (ref >60.00)
GLUCOSE: 122 mg/dL — AB (ref 70–99)
Potassium: 4.2 mEq/L (ref 3.5–5.1)
Sodium: 140 mEq/L (ref 135–145)

## 2014-04-06 LAB — AST: AST: 27 U/L (ref 0–37)

## 2014-04-06 LAB — HEMOGLOBIN: Hemoglobin: 15.4 g/dL (ref 13.0–17.0)

## 2014-04-06 LAB — FERRITIN: Ferritin: 43.2 ng/mL (ref 22.0–322.0)

## 2014-04-06 LAB — ALT: ALT: 23 U/L (ref 0–53)

## 2014-04-06 LAB — HEMOGLOBIN A1C: HEMOGLOBIN A1C: 5.9 % (ref 4.6–6.5)

## 2014-04-06 LAB — TSH: TSH: 0.94 u[IU]/mL (ref 0.35–5.50)

## 2014-04-06 LAB — IRON: Iron: 104 ug/dL (ref 42–165)

## 2014-04-06 NOTE — Telephone Encounter (Signed)
Unable to reach pre visit.  

## 2014-04-08 ENCOUNTER — Telehealth: Payer: Self-pay

## 2014-04-08 NOTE — Telephone Encounter (Signed)
Relevant patient education assigned to patient using Emmi. ° °

## 2014-04-11 ENCOUNTER — Other Ambulatory Visit: Payer: Self-pay | Admitting: Cardiology

## 2014-05-30 ENCOUNTER — Telehealth: Payer: Self-pay | Admitting: *Deleted

## 2014-05-30 NOTE — Telephone Encounter (Signed)
Spoke with patient after he walked into the office. Patient stated that he has been "lifting heavy equipment and tires and noticed a light pink tinge in the toilet tissue and in the toilet bowl." No active bleeding or changes to stool color. Patient stated this has happened before but thought he may need a doctor to look at him. Patient unable to see MD today and wanted appt tomorrow after 3pm so he "could play golf". Appt scheduled with Elyn Aquas, PA as he prefers a male physician and Dr. Larose Kells did not have a late appt tomorrow.

## 2014-06-01 ENCOUNTER — Ambulatory Visit (INDEPENDENT_AMBULATORY_CARE_PROVIDER_SITE_OTHER): Payer: Medicare Other | Admitting: Physician Assistant

## 2014-06-01 ENCOUNTER — Encounter: Payer: Self-pay | Admitting: Physician Assistant

## 2014-06-01 VITALS — BP 120/82 | HR 62 | Temp 98.2°F | Resp 16 | Ht 70.5 in | Wt 226.5 lb

## 2014-06-01 DIAGNOSIS — K648 Other hemorrhoids: Secondary | ICD-10-CM

## 2014-06-01 DIAGNOSIS — K625 Hemorrhage of anus and rectum: Secondary | ICD-10-CM

## 2014-06-01 LAB — CBC WITH DIFFERENTIAL/PLATELET
BASOS PCT: 1 % (ref 0–1)
Basophils Absolute: 0.1 10*3/uL (ref 0.0–0.1)
EOS ABS: 0.3 10*3/uL (ref 0.0–0.7)
Eosinophils Relative: 4 % (ref 0–5)
HCT: 40 % (ref 39.0–52.0)
HEMOGLOBIN: 14.1 g/dL (ref 13.0–17.0)
LYMPHS ABS: 2.1 10*3/uL (ref 0.7–4.0)
Lymphocytes Relative: 34 % (ref 12–46)
MCH: 32.4 pg (ref 26.0–34.0)
MCHC: 35.3 g/dL (ref 30.0–36.0)
MCV: 92 fL (ref 78.0–100.0)
Monocytes Absolute: 0.8 10*3/uL (ref 0.1–1.0)
Monocytes Relative: 13 % — ABNORMAL HIGH (ref 3–12)
NEUTROS PCT: 48 % (ref 43–77)
Neutro Abs: 3 10*3/uL (ref 1.7–7.7)
Platelets: 170 10*3/uL (ref 150–400)
RBC: 4.35 MIL/uL (ref 4.22–5.81)
RDW: 14.5 % (ref 11.5–15.5)
WBC: 6.3 10*3/uL (ref 4.0–10.5)

## 2014-06-01 MED ORDER — HYDROCORTISONE ACETATE 25 MG RE SUPP: 25.0000 mg | Freq: Two times a day (BID) | RECTAL | Status: DC

## 2014-06-01 NOTE — Progress Notes (Signed)
Patient presents to clinic today c/o bright red blood noted in the toilet water x 3 days.  Denies tenesmus or melena. Patient has history of recurrent bleeding hemorrhoids.  Patient is on Xarelto for anticoagulation.  Patient denies headache, SOB or palpitations.  Has been taking a fiber supplement daily.  Denies excessive straining for bowel movement.  Past Medical History  Diagnosis Date  . CAD (coronary artery disease)     s/p CABG 1993 (L-LAD, S-RI, S-D1);   echo 1/10: EF 55%;    myoview 12/09: inf MI, no ischemia  . Hyperlipidemia   . HTN (hypertension)   . Folliculitis   . Obesity   . Osteoarthritis   . Irritable bladder   . Family history of malignant neoplasm of gastrointestinal tract   . Myocardial infarction   . Candida esophagitis 2013    EGD   . Antral gastritis 2013    EGD   . Hiatal hernia   . Atrial fibrillation   . Anemia     Current Outpatient Prescriptions on File Prior to Visit  Medication Sig Dispense Refill  . amLODipine (NORVASC) 2.5 MG tablet Take 1 tablet (2.5 mg total) by mouth daily.  30 tablet  5  . atorvastatin (LIPITOR) 20 MG tablet Take one-half tablet by  mouth daily  45 tablet  0  . benazepril (LOTENSIN) 40 MG tablet Take 1 tablet (40 mg total) by mouth daily.  30 tablet  11  . Cholecalciferol (VITAMIN D) 1000 UNITS capsule Take 1,000 Units by mouth daily.        . metoprolol succinate (TOPROL-XL) 25 MG 24 hr tablet Take 1 tablet (25 mg total) by mouth daily.  30 tablet  5  . Multiple Vitamin (MULTIVITAMIN) capsule Take 1 capsule by mouth daily.        Marland Kitchen NITROSTAT 0.4 MG SL tablet USE AS DIRECTED  25 tablet  3  . pantoprazole (PROTONIX) 40 MG tablet TAKE 1 TABLET AT 6 MORNING  30 tablet  5  . Probiotic Product (PROBIOTIC DAILY PO) Take 1 tablet by mouth daily.      Marland Kitchen sulfacetamide (BLEPH-10) 10 % ophthalmic solution Place 1-2 drops into the right eye every 3 (three) hours while awake.  5 mL  0  . traMADol (ULTRAM) 50 MG tablet Take 1 tablet (50 mg  total) by mouth every 6 (six) hours as needed for pain.  30 tablet  6  . vitamin B-12 (CYANOCOBALAMIN) 1000 MCG tablet Take 1,000 mcg by mouth daily.      Alveda Reasons 20 MG TABS tablet Take 1 tablet by mouth  daily with supper  90 tablet  0   No current facility-administered medications on file prior to visit.    No Known Allergies  Family History  Problem Relation Age of Onset  . Coronary artery disease Father     and brother  . Heart attack Father     and brother-fatal  . Stroke Father   . Breast cancer Mother   . Colon cancer Maternal Uncle   . Esophageal cancer Neg Hx   . Stomach cancer Neg Hx   . Rectal cancer Neg Hx   . Prostate cancer Neg Hx     History   Social History  . Marital Status: Widowed    Spouse Name: N/A    Number of Children: 1  . Years of Education: N/A   Occupational History  . Retired    Social History Main Topics  . Smoking status:  Former Smoker -- 16 years    Quit date: 08/14/1964  . Smokeless tobacco: Never Used     Comment: quit 1965  . Alcohol Use: No  . Drug Use: No  . Sexual Activity: None   Other Topics Concern  . 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 Married '57- widowed 03-24-23. 1 son -'61- died '6 MVA.    Retired: keeps himself busy.    Lives by himself   Review of Systems - See HPI.  All other ROS are negative.  BP 120/82  Pulse 62  Temp(Src) 98.2 F (36.8 C) (Oral)  Resp 16  Ht 5' 10.5" (1.791 m)  Wt 226 lb 8 oz (102.74 kg)  BMI 32.03 kg/m2  SpO2 96%  Physical Exam  Vitals reviewed. Constitutional: He is well-developed, well-nourished, and in no distress.  HENT:  Head: Normocephalic and atraumatic.  Eyes: Conjunctivae are normal.  Cardiovascular: Normal heart sounds and intact distal pulses.   Irregularly irregular rhythm of chronic a. Fib.  Pulmonary/Chest: Effort normal and breath sounds normal. No respiratory distress. He has no wheezes. He has no rales.  Genitourinary: Rectal exam shows  internal hemorrhoid. Rectal exam shows no external hemorrhoid, no fissure, no mass and no tenderness.  Hemoccult negative.  Skin: Skin is warm and dry. No rash noted.  Psychiatric: Affect normal.    Recent Results (from the past 2160 hour(s))  BASIC METABOLIC PANEL     Status: Abnormal   Collection Time    04/05/14  4:29 PM      Result Value Ref Range   Sodium 140  135 - 145 mEq/L   Potassium 4.2  3.5 - 5.1 mEq/L   Chloride 104  96 - 112 mEq/L   CO2 29  19 - 32 mEq/L   Glucose, Bld 122 (*) 70 - 99 mg/dL   BUN 16  6 - 23 mg/dL   Creatinine, Ser 1.3  0.4 - 1.5 mg/dL   Calcium 9.7  8.4 - 10.5 mg/dL   GFR 55.41 (*) >60.00 mL/min  AST     Status: None   Collection Time    04/05/14  4:29 PM      Result Value Ref Range   AST 27  0 - 37 U/L  ALT     Status: None   Collection Time    04/05/14  4:29 PM      Result Value Ref Range   ALT 23  0 - 53 U/L  FERRITIN     Status: None   Collection Time    04/05/14  4:29 PM      Result Value Ref Range   Ferritin 43.2  22.0 - 322.0 ng/mL  IRON     Status: None   Collection Time    04/05/14  4:29 PM      Result Value Ref Range   Iron 104  42 - 165 ug/dL  HEMOGLOBIN     Status: None   Collection Time    04/05/14  4:29 PM      Result Value Ref Range   Hemoglobin 15.4  13.0 - 17.0 g/dL  HEMOGLOBIN A1C     Status: None   Collection Time    04/05/14  4:29 PM      Result Value Ref Range   Hemoglobin A1C 5.9  4.6 - 6.5 %   Comment: Glycemic Control Guidelines for People with Diabetes:Non Diabetic:  <6%Goal of Therapy: <7%Additional Action Suggested:  >8%  TSH     Status: None   Collection Time    04/05/14  4:29 PM      Result Value Ref Range   TSH 0.94  0.35 - 5.50 uIU/mL    Assessment/Plan: Internal hemorrhoid Rx Anusol.  Continue fiber supplement.  Get good fluid intake.  Encouraged stool softener. Due to chronic anticoagulation, will check CBC and IFOB.

## 2014-06-01 NOTE — Assessment & Plan Note (Signed)
Rx Anusol.  Continue fiber supplement.  Get good fluid intake.  Encouraged stool softener. Due to chronic anticoagulation, will check CBC and IFOB.

## 2014-06-01 NOTE — Progress Notes (Signed)
Pre visit review using our clinic review tool, if applicable. No additional management support is needed unless otherwise documented below in the visit note/SLS  

## 2014-06-01 NOTE — Patient Instructions (Signed)
Please use anusol as directed. Increase fluids.  Continue fiber supplement.  I will call you with your lab results.  Please mail in the IFOB test to the lab.  Call or return to clinic if new or worsening symptoms develop.

## 2014-06-06 ENCOUNTER — Other Ambulatory Visit: Payer: Self-pay | Admitting: Internal Medicine

## 2014-06-06 ENCOUNTER — Telehealth: Payer: Self-pay | Admitting: Internal Medicine

## 2014-06-06 ENCOUNTER — Other Ambulatory Visit (INDEPENDENT_AMBULATORY_CARE_PROVIDER_SITE_OTHER): Payer: Medicare Other

## 2014-06-06 DIAGNOSIS — Z1211 Encounter for screening for malignant neoplasm of colon: Secondary | ICD-10-CM

## 2014-06-06 LAB — FECAL OCCULT BLOOD, IMMUNOCHEMICAL: FECAL OCCULT BLD: NEGATIVE

## 2014-06-06 NOTE — Telephone Encounter (Signed)
Caller name: Nicole Kindred  Call back [phone]   Reason for call:  Pt would like results of recent lab test

## 2014-06-06 NOTE — Telephone Encounter (Signed)
Done . Pt notified of lab results.

## 2014-06-09 ENCOUNTER — Encounter: Payer: Self-pay | Admitting: *Deleted

## 2014-06-13 ENCOUNTER — Telehealth: Payer: Self-pay | Admitting: *Deleted

## 2014-06-13 NOTE — Telephone Encounter (Signed)
Pt left message on voicemail that he has been trying to reach [place] re: test results. Attempted to reach pt left message for pt to return my call.

## 2014-06-16 ENCOUNTER — Encounter: Payer: Self-pay | Admitting: Emergency Medicine

## 2014-06-16 ENCOUNTER — Emergency Department
Admission: EM | Admit: 2014-06-16 | Discharge: 2014-06-16 | Disposition: A | Discharge status: Home / Self Care | Payer: Medicare Other | Source: Home / Self Care | Attending: Emergency Medicine | Admitting: Emergency Medicine

## 2014-06-16 DIAGNOSIS — S335XXA Sprain of ligaments of lumbar spine, initial encounter: Secondary | ICD-10-CM

## 2014-06-16 DIAGNOSIS — S39012A Strain of muscle, fascia and tendon of lower back, initial encounter: Secondary | ICD-10-CM

## 2014-06-16 MED ORDER — CYCLOBENZAPRINE HCL 5 MG PO TABS: ORAL_TABLET | ORAL | Status: DC

## 2014-06-16 NOTE — ED Notes (Signed)
Reports onset of lower left back pain about a week ago; may have been lifting something heavy. Denies dysuria or frequency. No OTCs.

## 2014-06-16 NOTE — ED Provider Notes (Signed)
CSN: [id number]     Arrival date & time 06/16/14  1457 History   First MD Initiated Contact with Patient 06/16/14 1503     Chief Complaint  Patient presents with  . Back Pain    Patient is a 80 y.o. male presenting with back pain. The history is provided by the patient.  Back Pain Pain location: Right low back. Quality:  Aching Radiates to:  Does not radiate Pain severity:  Mild (1/10, increases to 2/20 with certain movements) Pain is:  Unable to specify Onset quality:  Gradual Duration:  1 week Timing:  Intermittent Progression:  Waxing and waning Chronicity:  New Context: lifting heavy objects   Context: not falling, not occupational injury and not recent illness   Context comment:  He did some heavy lifting 8 or 9 days ago which may have precipitated his back pain Relieved by: Rest. Worsened by:  Twisting Ineffective treatments: Tried one tramadol, without any noticeable improvement. Associated symptoms: no abdominal pain, no bladder incontinence, no bowel incontinence, no chest pain, no dysuria, no fever, no headaches, no leg pain, no numbness, no paresthesias, no pelvic pain, no perianal numbness, no tingling, no weakness and no weight loss    He requests some Flexeril as a muscle relaxant to use at night  Past Medical History  Diagnosis Date  . CAD (coronary artery disease)     s/p CABG 1993 (L-LAD, S-RI, S-D1);   echo 1/10: EF 55%;    myoview 12/09: inf MI, no ischemia  . Hyperlipidemia   . HTN (hypertension)   . Folliculitis   . Obesity   . Osteoarthritis   . Irritable bladder   . Family history of malignant neoplasm of gastrointestinal tract   . Myocardial infarction   . Candida esophagitis 2013    EGD   . Antral gastritis 2013    EGD   . Hiatal hernia   . Atrial fibrillation   . Anemia    Past Surgical History  Procedure Laterality Date  . Coronary artery bypass graft      graft '92: LIMA-LAD, SVG - D2,R1, D1  . Cholecystectomy      laparoscopic '92  .  Mole removal  2001 and 2008  . Pilonidal cyst excision    . Enteroscopy  08/22/2012    Procedure: ENTEROSCOPY;  Surgeon: Juanita Craver, MD;  Location: [facility];  Service: Endoscopy;  Laterality: N/A;   Family History  Problem Relation Age of Onset  . Coronary artery disease Father     and brother  . Heart attack Father     and brother-fatal  . Stroke Father   . Breast cancer Mother   . Colon cancer Maternal Uncle   . Esophageal cancer Neg Hx   . Stomach cancer Neg Hx   . Rectal cancer Neg Hx   . Prostate cancer Neg Hx    History  Substance Use Topics  . Smoking status: Former Smoker -- 16 years    Quit date: 08/14/1964  . Smokeless tobacco: Never Used     Comment: quit 1965  . Alcohol Use: No    Review of Systems  Constitutional: Negative for fever and weight loss.  Respiratory: Negative.  Negative for cough and shortness of breath.   Cardiovascular: Negative.  Negative for chest pain.  Gastrointestinal: Negative for abdominal pain, blood in stool and bowel incontinence.  Genitourinary: Negative for bladder incontinence, dysuria, hematuria, difficulty urinating and pelvic pain.  Musculoskeletal: Positive for back pain.  Neurological: Negative  for tingling, syncope, weakness, light-headedness, numbness, headaches and paresthesias.  All other systems reviewed and are negative.   Allergies  Review of patient's allergies indicates no known allergies.  Home Medications   Prior to Admission medications   Medication Sig Start Date End Date Taking? Authorizing Provider  amLODipine (NORVASC) 2.5 MG tablet Take 1 tablet (2.5 mg total) by mouth daily. 01/20/14   Neena Rhymes, MD  atorvastatin (LIPITOR) 20 MG tablet Take one-half tablet by  mouth daily    Lelon Perla, MD  benazepril (LOTENSIN) 40 MG tablet Take 1 tablet (40 mg total) by mouth daily. 01/13/14   Neena Rhymes, MD  Cholecalciferol (VITAMIN D) 1000 UNITS capsule Take 1,000 Units by mouth daily.       Historical Provider, MD  cyclobenzaprine (FLEXERIL) 5 MG tablet Take 1 at bedtime as needed for muscle relaxant. Caution: May cause drowsiness. 06/16/14   Jacqulyn Cane, MD  hydrocortisone (ANUSOL-HC) 25 MG suppository Place 1 suppository (25 mg total) rectally 2 (two) times daily. 06/01/14   Leeanne Rio, PA-C  metoprolol succinate (TOPROL-XL) 25 MG 24 hr tablet Take 1 tablet (25 mg total) by mouth daily. 01/04/14   Lelon Perla, MD  Multiple Vitamin (MULTIVITAMIN) capsule Take 1 capsule by mouth daily.      Historical Provider, MD  NITROSTAT 0.4 MG SL tablet USE AS DIRECTED    Lelon Perla, MD  pantoprazole (PROTONIX) 40 MG tablet TAKE 1 TABLET AT 6 MORNING    Neena Rhymes, MD  Probiotic Product (PROBIOTIC DAILY PO) Take 1 tablet by mouth daily.    Historical Provider, MD  sulfacetamide (BLEPH-10) 10 % ophthalmic solution Place 1-2 drops into the right eye every 3 (three) hours while awake. 03/25/14   Kandra Nicolas, MD  traMADol (ULTRAM) 50 MG tablet Take 1 tablet (50 mg total) by mouth every 6 (six) hours as needed for pain. 09/01/12   Sable Feil, MD  vitamin B-12 (CYANOCOBALAMIN) 1000 MCG tablet Take 1,000 mcg by mouth daily.    Historical Provider, MD  XARELTO 20 MG TABS tablet Take 1 tablet by mouth  daily with supper    Lelon Perla, MD   BP 102/63  Pulse 68  Temp(Src) 97.9 F (36.6 C) (Oral)  Resp 16  Ht 5' 10.5" (1.791 m)  Wt 218 lb (98.884 kg)  BMI 30.83 kg/m2  SpO2 95% Physical Exam  Nursing note and vitals reviewed. Constitutional: He is oriented to person, place, and time. He appears well-developed and well-nourished. He is cooperative.  Non-toxic appearance. No distress (Appears uncomfortable from low back pain.).  HENT:  Head: Normocephalic and atraumatic.  Mouth/Throat: Oropharynx is clear and moist.  Eyes: EOM are normal. Pupils are equal, round, and reactive to light. No scleral icterus.  Neck: Neck supple.  Cardiovascular: Regular rhythm and  normal heart sounds.   Pulmonary/Chest: Effort normal and breath sounds normal. No respiratory distress. He has no wheezes. He has no rales. He exhibits no tenderness.  Abdominal: Soft. There is no tenderness.  Musculoskeletal:       Right hip: Normal.       Left hip: Normal.       Cervical back: He exhibits no tenderness.       Thoracic back: He exhibits no tenderness.       Lumbar back: He exhibits tenderness and spasm. He exhibits normal range of motion, no bony tenderness, no swelling, no edema, no deformity, no laceration and normal  pulse.       Back:  + Right paralumbar muscle tenderness and spasm, exacerbated by twisting. No spinal tenderness or deformity. No hip tenderness or deformity  Negative Right straight leg-raise test. Negative Left straight leg-raise test.  Negative Right Saralyn Pilar test. Negative Left Saralyn Pilar test.    Neurological: He is alert and oriented to person, place, and time. He has normal strength. He displays no atrophy, no tremor and normal reflexes. No cranial nerve deficit or sensory deficit. He exhibits normal muscle tone. Gait normal.  Reflex Scores:      Patellar reflexes are 2+ on the right side and 2+ on the left side.      Achilles reflexes are 2+ on the right side and 2+ on the left side. Skin: Skin is warm, dry and intact. No bruising, no lesion and no rash noted.  Psychiatric: He has a normal mood and affect.    ED Course  Procedures (including critical care time) Labs Review Labs Reviewed - No data to display  Imaging Review No results found.   MDM   1. Lumbar strain, initial encounter    right paralumbar strain  Pain is mild, 1/10 intensity currently.  No evidence of any bony cause. No evidence of any nerve impingement. By history, no urinary symptoms.  Treatment options discussed, as well as risks, benefits, alternatives. Patient voiced understanding and agreement with the following plans: Extra strength Tylenol, 2 every 4-6  hours as needed for pain. He requested Flexeril to take at night, and I prescribed Flexeril 5 mg. #10. No refills. 1 at bedtime when necessary muscle relaxant. Heat and other nonpharmacologic treatment discussed . Avoid lifting, but gradually increase activity as tolerated. Follow-up with your primary care doctor in 5-7 days if not improving, or sooner if symptoms become worse. Precautions discussed. Red flags discussed. Questions invited and answered. Patient voiced understanding and agreement.   Jacqulyn Cane, MD 06/16/14 [phone]

## 2014-07-05 ENCOUNTER — Other Ambulatory Visit: Payer: Self-pay | Admitting: Cardiology

## 2014-07-19 ENCOUNTER — Telehealth: Payer: Self-pay | Admitting: *Deleted

## 2014-07-19 MED ORDER — RIVAROXABAN 20 MG PO TABS: ORAL_TABLET | ORAL | Status: DC

## 2014-07-19 NOTE — Telephone Encounter (Signed)
Patient walked in to office States he will run out of medication prior to his appointment,he does mail order. RN offered to call  Into local pharmacy. Patient request some samples RN stated only have Xarelto and not Atorvastatin. Samples Xarelto given (3 ). Patient states he has old prescription at local pharmacy he will get a partial supply

## 2014-07-22 ENCOUNTER — Telehealth: Payer: Self-pay | Admitting: *Deleted

## 2014-07-27 ENCOUNTER — Telehealth: Payer: Self-pay | Admitting: Internal Medicine

## 2014-07-27 ENCOUNTER — Ambulatory Visit (INDEPENDENT_AMBULATORY_CARE_PROVIDER_SITE_OTHER): Payer: Medicare Other | Admitting: Internal Medicine

## 2014-07-27 ENCOUNTER — Encounter: Payer: Self-pay | Admitting: Internal Medicine

## 2014-07-27 VITALS — BP 126/72 | HR 61 | Temp 98.0°F | Wt 224.4 lb

## 2014-07-27 DIAGNOSIS — E785 Hyperlipidemia, unspecified: Secondary | ICD-10-CM

## 2014-07-27 DIAGNOSIS — I10 Essential (primary) hypertension: Secondary | ICD-10-CM

## 2014-07-27 LAB — BASIC METABOLIC PANEL
BUN: 18 mg/dL (ref 6–23)
CALCIUM: 9.3 mg/dL (ref 8.4–10.5)
CO2: 26 mEq/L (ref 19–32)
Chloride: 107 mEq/L (ref 96–112)
Creatinine, Ser: 1.5 mg/dL (ref 0.4–1.5)
GFR: 47.41 mL/min — AB (ref >60.00)
Glucose, Bld: 115 mg/dL — ABNORMAL HIGH (ref 70–99)
POTASSIUM: 3.9 meq/L (ref 3.5–5.1)
SODIUM: 141 meq/L (ref 135–145)

## 2014-07-27 LAB — LIPID PANEL
Cholesterol: 127 mg/dL (ref 0–200)
HDL: 29.2 mg/dL — AB (ref >39.00)
LDL Cholesterol: 58 mg/dL (ref 0–99)
NONHDL: 97.8
TRIGLYCERIDES: 200 mg/dL — AB (ref 0.0–149.0)
Total CHOL/HDL Ratio: 4
VLDL: 40 mg/dL (ref 0.0–40.0)

## 2014-07-27 MED ORDER — ATORVASTATIN CALCIUM 20 MG PO TABS: ORAL_TABLET | ORAL | Status: DC

## 2014-07-27 NOTE — Progress Notes (Signed)
Pre-visit discussion using our clinic review tool. No additional management support is needed unless otherwise documented below in the visit note.  

## 2014-07-27 NOTE — Telephone Encounter (Signed)
Relevant patient education assigned to patient using Emmi. ° °

## 2014-07-27 NOTE — Assessment & Plan Note (Addendum)
Doing very well on current medications. Reports ambulatory BPs are usually in the 120s. Plan: No change in meds, bmp, next visit by April 2016

## 2014-07-27 NOTE — Progress Notes (Signed)
Subjective:    Patient ID: [name], male    DOB: 1931/03/11, 80 y.o.   MRN: [id number]  DOS:  07/27/2014 Type of visit - description: routine History: In general feels well, good medication compliance. Since the last time he was here developed a back sprain and also hemorrhoidal bleed, he is now essentially asymptomatic.   ROS Denies chest pain, palpitations. Very seldom has swelling around the ankles. No nausea, vomiting. No gross hematuria.   Past Medical History  Diagnosis Date  . CAD (coronary artery disease)     s/p CABG 1993 (L-LAD, S-RI, S-D1);   echo 1/10: EF 55%;    myoview 12/09: inf MI, no ischemia  . Hyperlipidemia   . HTN (hypertension)   . Folliculitis   . Obesity   . Osteoarthritis   . Irritable bladder   . Family history of malignant neoplasm of gastrointestinal tract   . Myocardial infarction   . Candida esophagitis 2013    EGD   . Antral gastritis 2013    EGD   . Hiatal hernia   . Atrial fibrillation   . Anemia     Past Surgical History  Procedure Laterality Date  . Coronary artery bypass graft      graft '92: LIMA-LAD, SVG - D2,R1, D1  . Cholecystectomy      laparoscopic '92  . Mole removal  2001 and 2008  . Pilonidal cyst excision    . Enteroscopy  08/22/2012    Procedure: ENTEROSCOPY;  Surgeon: Juanita Craver, MD;  Location: [facility];  Service: Endoscopy;  Laterality: N/A;    History   Social History  . Marital Status: Widowed    Spouse Name: N/A    Number of Children: 1  . Years of Education: N/A   Occupational History  . Retired    Social History Main Topics  . Smoking status: Former Smoker -- 16 years    Quit date: 08/14/1964  . Smokeless tobacco: Never Used     Comment: quit 1965  . Alcohol Use: No  . Drug Use: No  . Sexual Activity: Not on file   Other Topics Concern  . 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 Married '57- widowed 2023-03-25. 1 son -'27- died '43 MVA.    Retired: keeps himself  busy.    Lives by himself        Medication List       This list is accurate as of: 07/27/14  6:19 PM.  Always use your most recent med list.               amLODipine 2.5 MG tablet  Commonly known as:  NORVASC  Take 1 tablet (2.5 mg total) by mouth daily.     atorvastatin 20 MG tablet  Commonly known as:  LIPITOR  Take one-half tablet by  mouth daily     benazepril 40 MG tablet  Commonly known as:  LOTENSIN  Take 1 tablet (40 mg total) by mouth daily.     cyclobenzaprine 5 MG tablet  Commonly known as:  FLEXERIL  Take 1 at bedtime as needed for muscle relaxant. Caution: May cause drowsiness.     hydrocortisone 25 MG suppository  Commonly known as:  ANUSOL-HC  Place 1 suppository (25 mg total) rectally 2 (two) times daily.     metoprolol succinate 25 MG 24 hr tablet  Commonly known as:  TOPROL-XL  Take 1 tablet (25 mg  total) by mouth daily.     multivitamin capsule  Take 1 capsule by mouth daily.     NITROSTAT 0.4 MG SL tablet  Generic drug:  nitroGLYCERIN  USE AS DIRECTED     pantoprazole 40 MG tablet  Commonly known as:  PROTONIX  TAKE 1 TABLET AT 6 MORNING     PROBIOTIC DAILY PO  Take 1 tablet by mouth daily.     rivaroxaban 20 MG Tabs tablet  Commonly known as:  XARELTO  Take 1 tablet by mouth  daily with supper     sulfacetamide 10 % ophthalmic solution  Commonly known as:  BLEPH-10  Place 1-2 drops into the right eye every 3 (three) hours while awake.     traMADol 50 MG tablet  Commonly known as:  ULTRAM  Take 1 tablet (50 mg total) by mouth every 6 (six) hours as needed for pain.     vitamin B-12 1000 MCG tablet  Commonly known as:  CYANOCOBALAMIN  Take 1,000 mcg by mouth daily.     Vitamin D 1000 UNITS capsule  Take 1,000 Units by mouth daily.           Objective:   Physical Exam BP 126/72  Pulse 61  Temp(Src) 98 F (36.7 C) (Oral)  Wt 224 lb 6.4 oz (101.787 kg)  SpO2 95%  General -- alert, well-developed, NAD.  Lungs --  normal respiratory effort, no intercostal retractions, no accessory muscle use, and normal breath sounds.  Heart-- irreg.   Extremities-- trace periankle edema, no  pretibial edema bilaterally  Neurologic--  alert & oriented X3. Speech normal, gait appropriate for age, strength symmetric and appropriate for age.  Psych-- Cognition and judgment appear intact. Cooperative with normal attention span and concentration. No anxious or depressed appearing.      Assessment & Plan:

## 2014-07-27 NOTE — Assessment & Plan Note (Addendum)
Needs a refill on atorvastatin. Last lfts  normal Plan: FLP, prescription provided, RTC 4-16

## 2014-07-27 NOTE — Patient Instructions (Signed)
Get your blood work before you leave   Get a flu shot this Fall  Next visit is for a physical exam by 03-2015, fasting Please make an appointment

## 2014-08-01 ENCOUNTER — Telehealth: Payer: Self-pay | Admitting: Cardiology

## 2014-08-01 NOTE — Telephone Encounter (Signed)
New problem   Pt stated he saw Dr Marlou Porch last year and was told he needed an Echocardiogram this year. Please advise pt

## 2014-08-01 NOTE — Telephone Encounter (Signed)
This appears to be a pt of Dr Jacalyn Lefevre - can you please look into this and route it appropriately.

## 2014-08-02 ENCOUNTER — Ambulatory Visit (INDEPENDENT_AMBULATORY_CARE_PROVIDER_SITE_OTHER): Payer: Medicare Other | Admitting: Cardiology

## 2014-08-02 ENCOUNTER — Encounter: Payer: Self-pay | Admitting: Cardiology

## 2014-08-02 VITALS — BP 134/82 | HR 61 | Ht 70.0 in | Wt 223.0 lb

## 2014-08-02 DIAGNOSIS — I10 Essential (primary) hypertension: Secondary | ICD-10-CM

## 2014-08-02 DIAGNOSIS — I4891 Unspecified atrial fibrillation: Secondary | ICD-10-CM

## 2014-08-02 DIAGNOSIS — E785 Hyperlipidemia, unspecified: Secondary | ICD-10-CM

## 2014-08-02 DIAGNOSIS — I2510 Atherosclerotic heart disease of native coronary artery without angina pectoris: Secondary | ICD-10-CM

## 2014-08-02 MED ORDER — RIVAROXABAN 20 MG PO TABS: ORAL_TABLET | ORAL | Status: DC

## 2014-08-02 MED ORDER — AMLODIPINE BESYLATE 2.5 MG PO TABS: 2.5000 mg | ORAL_TABLET | Freq: Every day | ORAL | Status: DC

## 2014-08-02 MED ORDER — PANTOPRAZOLE SODIUM 40 MG PO TBEC: 40.0000 mg | DELAYED_RELEASE_TABLET | Freq: Every day | ORAL | Status: DC

## 2014-08-02 MED ORDER — METOPROLOL SUCCINATE ER 25 MG PO TB24: 25.0000 mg | ORAL_TABLET | Freq: Every day | ORAL | Status: DC

## 2014-08-02 NOTE — Assessment & Plan Note (Signed)
Continue statin. Not on aspirin given need for anticoagulation. 

## 2014-08-02 NOTE — Progress Notes (Signed)
HPI: FU coronary artery disease and atrial fibrillation. Patient is status post coronary artery bypassing graft in 1993 with a LIMA to the LAD, saphenous vein graft to the ramus intermedius and saphenous vein graft to the first diagonal. CT of the abdomen in January of 2009 showed no renal artery stenosis. There was no aortic aneurysm. Last echocardiogram in April of 2012 showed an ejection fraction of 45-50% and mild left atrial enlargement. Nuclear study in June 2014 showed scar in the mid/apical inferior wall and apex. There was a small area of ischemia in the base to mid anterior wall. The study was unchanged compared to May of 2012. The study was not gated. We have treated medically. Since I last saw him the patient denies any dyspnea on exertion, orthopnea, PND, pedal edema, palpitations, syncope or chest pain.   Current Outpatient Prescriptions  Medication Sig Dispense Refill  . amLODipine (NORVASC) 2.5 MG tablet Take 1 tablet (2.5 mg total) by mouth daily.  30 tablet  5  . atorvastatin (LIPITOR) 20 MG tablet Take one-half tablet by  mouth daily  45 tablet  3  . benazepril (LOTENSIN) 40 MG tablet Take 1 tablet (40 mg total) by mouth daily.  30 tablet  11  . Cholecalciferol (VITAMIN D) 1000 UNITS capsule Take 1,000 Units by mouth daily.        . cyclobenzaprine (FLEXERIL) 5 MG tablet Take 1 at bedtime as needed for muscle relaxant. Caution: May cause drowsiness.  10 tablet  0  . hydrocortisone (ANUSOL-HC) 25 MG suppository Place 1 suppository (25 mg total) rectally 2 (two) times daily.  12 suppository  0  . metoprolol succinate (TOPROL-XL) 25 MG 24 hr tablet Take 1 tablet (25 mg total) by mouth daily.  30 tablet  5  . Multiple Vitamin (MULTIVITAMIN) capsule Take 1 capsule by mouth daily.        Marland Kitchen NITROSTAT 0.4 MG SL tablet USE AS DIRECTED  25 tablet  3  . pantoprazole (PROTONIX) 40 MG tablet TAKE 1 TABLET AT 6 MORNING  30 tablet  5  . Probiotic Product (PROBIOTIC DAILY PO) Take 1  tablet by mouth daily.      . rivaroxaban (XARELTO) 20 MG TABS tablet Take 1 tablet by mouth  daily with supper  15 tablet  0  . sulfacetamide (BLEPH-10) 10 % ophthalmic solution Place 1-2 drops into the right eye every 3 (three) hours while awake.  5 mL  0  . traMADol (ULTRAM) 50 MG tablet Take 1 tablet (50 mg total) by mouth every 6 (six) hours as needed for pain.  30 tablet  6  . vitamin B-12 (CYANOCOBALAMIN) 1000 MCG tablet Take 1,000 mcg by mouth daily.       No current facility-administered medications for this visit.     Past Medical History  Diagnosis Date  . CAD (coronary artery disease)     s/p CABG 1993 (L-LAD, S-RI, S-D1);   echo 1/10: EF 55%;    myoview 12/09: inf MI, no ischemia  . Hyperlipidemia   . HTN (hypertension)   . Folliculitis   . Obesity   . Osteoarthritis   . Irritable bladder   . Family history of malignant neoplasm of gastrointestinal tract   . Myocardial infarction   . Candida esophagitis 2013    EGD   . Antral gastritis 2013    EGD   . Hiatal hernia   . Atrial fibrillation   . Anemia  Past Surgical History  Procedure Laterality Date  . Coronary artery bypass graft      graft '92: LIMA-LAD, SVG - D2,R1, D1  . Cholecystectomy      laparoscopic '92  . Mole removal  2001 and 2008  . Pilonidal cyst excision    . Enteroscopy  08/22/2012    Procedure: ENTEROSCOPY;  Surgeon: Juanita Craver, MD;  Location: [facility];  Service: Endoscopy;  Laterality: N/A;    History   Social History  . Marital Status: Widowed    Spouse Name: N/A    Number of Children: 1  . Years of Education: N/A   Occupational History  . Retired    Social History Main Topics  . Smoking status: Former Smoker -- 16 years    Quit date: 08/14/1964  . Smokeless tobacco: Never Used     Comment: quit 1965  . Alcohol Use: No  . Drug Use: No  . Sexual Activity: Not on file   Other Topics Concern  . 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 Married  '57- widowed 16-Mar-2023. 1 son -'3- died '71 MVA.    Retired: keeps himself busy.    Lives by himself    ROS: no fevers or chills, productive cough, hemoptysis, dysphasia, odynophagia, melena, hematochezia, dysuria, hematuria, rash, seizure activity, orthopnea, PND, pedal edema, claudication. Remaining systems are negative.  Physical Exam: Well-developed well-nourished in no acute distress.  Skin is warm and dry.  HEENT is normal.  Neck is supple.  Chest is clear to auscultation with normal expansion.  Cardiovascular exam is irregular Abdominal exam nontender or distended. No masses palpated. Extremities show no edema. neuro grossly intact  ECG Atrial fibrillation at a rate of 61. No ST changes.

## 2014-08-02 NOTE — Patient Instructions (Signed)
Your physician wants you to follow-up in: ONE YEAR WITH DR CRENSHAW IN [place] You will receive a reminder letter in the mail two months in advance. If you don't receive a letter, please call our office to schedule the follow-up appointment.  

## 2014-08-02 NOTE — Assessment & Plan Note (Signed)
Continue statin. 

## 2014-08-02 NOTE — Assessment & Plan Note (Signed)
Blood pressure controlled. Continue present medications. 

## 2014-08-02 NOTE — Assessment & Plan Note (Signed)
Continue Toprol. Continue xarelto at present dose; recent GFR 56. Recent Hgb normal.

## 2014-08-02 NOTE — Addendum Note (Signed)
Addended by: Cristopher Estimable on: 08/02/2014 09:19 AM   Modules accepted: Orders

## 2014-08-03 ENCOUNTER — Telehealth: Payer: Self-pay | Admitting: Cardiology

## 2014-08-03 MED ORDER — RIVAROXABAN 20 MG PO TABS: ORAL_TABLET | ORAL | Status: DC

## 2014-08-03 NOTE — Telephone Encounter (Signed)
Spoke with pt, script sent to the pharm

## 2014-08-03 NOTE — Telephone Encounter (Signed)
Need a new prescription for his Xarelto 20mg . Please call to [facility]-[phone] Please call-says he needs to talk to you(Debra).

## 2014-08-03 NOTE — Telephone Encounter (Signed)
F/u   This is a [facility] pt.. Please advise of previous message.

## 2014-08-04 ENCOUNTER — Encounter: Payer: Self-pay | Admitting: *Deleted

## 2014-08-04 ENCOUNTER — Other Ambulatory Visit: Payer: Self-pay | Admitting: *Deleted

## 2014-08-04 MED ORDER — RIVAROXABAN 20 MG PO TABS: ORAL_TABLET | ORAL | Status: DC

## 2014-08-04 NOTE — Telephone Encounter (Signed)
This encounter was created in error - please disregard.

## 2014-08-05 ENCOUNTER — Ambulatory Visit: Payer: Medicare Other | Admitting: Internal Medicine

## 2014-08-18 NOTE — Telephone Encounter (Signed)
error 

## 2014-08-22 ENCOUNTER — Emergency Department (HOSPITAL_BASED_OUTPATIENT_CLINIC_OR_DEPARTMENT_OTHER)
Admission: EM | Admit: 2014-08-22 | Discharge: 2014-08-22 | Disposition: A | Discharge status: Home / Self Care | Payer: Medicare Other | Attending: Emergency Medicine | Admitting: Emergency Medicine

## 2014-08-22 ENCOUNTER — Emergency Department (HOSPITAL_BASED_OUTPATIENT_CLINIC_OR_DEPARTMENT_OTHER): Payer: Medicare Other

## 2014-08-22 ENCOUNTER — Encounter (HOSPITAL_BASED_OUTPATIENT_CLINIC_OR_DEPARTMENT_OTHER): Payer: Self-pay | Admitting: Emergency Medicine

## 2014-08-22 DIAGNOSIS — I2510 Atherosclerotic heart disease of native coronary artery without angina pectoris: Secondary | ICD-10-CM | POA: Diagnosis not present

## 2014-08-22 DIAGNOSIS — I252 Old myocardial infarction: Secondary | ICD-10-CM | POA: Diagnosis not present

## 2014-08-22 DIAGNOSIS — Y939 Activity, unspecified: Secondary | ICD-10-CM | POA: Diagnosis not present

## 2014-08-22 DIAGNOSIS — Z79899 Other long term (current) drug therapy: Secondary | ICD-10-CM | POA: Diagnosis not present

## 2014-08-22 DIAGNOSIS — IMO0002 Reserved for concepts with insufficient information to code with codable children: Secondary | ICD-10-CM | POA: Insufficient documentation

## 2014-08-22 DIAGNOSIS — Z7901 Long term (current) use of anticoagulants: Secondary | ICD-10-CM | POA: Diagnosis not present

## 2014-08-22 DIAGNOSIS — X500XXA Overexertion from strenuous movement or load, initial encounter: Secondary | ICD-10-CM | POA: Diagnosis not present

## 2014-08-22 DIAGNOSIS — Z8739 Personal history of other diseases of the musculoskeletal system and connective tissue: Secondary | ICD-10-CM | POA: Insufficient documentation

## 2014-08-22 DIAGNOSIS — Z862 Personal history of diseases of the blood and blood-forming organs and certain disorders involving the immune mechanism: Secondary | ICD-10-CM | POA: Insufficient documentation

## 2014-08-22 DIAGNOSIS — Z8619 Personal history of other infectious and parasitic diseases: Secondary | ICD-10-CM | POA: Diagnosis not present

## 2014-08-22 DIAGNOSIS — Z951 Presence of aortocoronary bypass graft: Secondary | ICD-10-CM | POA: Diagnosis not present

## 2014-08-22 DIAGNOSIS — S76311A Strain of muscle, fascia and tendon of the posterior muscle group at thigh level, right thigh, initial encounter: Secondary | ICD-10-CM

## 2014-08-22 DIAGNOSIS — I10 Essential (primary) hypertension: Secondary | ICD-10-CM | POA: Insufficient documentation

## 2014-08-22 DIAGNOSIS — Y929 Unspecified place or not applicable: Secondary | ICD-10-CM | POA: Diagnosis not present

## 2014-08-22 DIAGNOSIS — M79609 Pain in unspecified limb: Secondary | ICD-10-CM | POA: Insufficient documentation

## 2014-08-22 DIAGNOSIS — Z87891 Personal history of nicotine dependence: Secondary | ICD-10-CM | POA: Insufficient documentation

## 2014-08-22 DIAGNOSIS — Z8719 Personal history of other diseases of the digestive system: Secondary | ICD-10-CM | POA: Diagnosis not present

## 2014-08-22 DIAGNOSIS — E785 Hyperlipidemia, unspecified: Secondary | ICD-10-CM | POA: Diagnosis not present

## 2014-08-22 DIAGNOSIS — I4891 Unspecified atrial fibrillation: Secondary | ICD-10-CM | POA: Diagnosis not present

## 2014-08-22 DIAGNOSIS — E669 Obesity, unspecified: Secondary | ICD-10-CM | POA: Diagnosis not present

## 2014-08-22 MED ORDER — OXYCODONE-ACETAMINOPHEN 5-325 MG PO TABS: 1.0000 | ORAL_TABLET | Freq: Four times a day (QID) | ORAL | Status: DC | PRN

## 2014-08-22 NOTE — ED Notes (Signed)
Pt having some knee pain for several weeks, had cortisone shot on Thursday.  Pt having pain in back of knee and hamstring.  No long distance travel.

## 2014-08-22 NOTE — ED Provider Notes (Signed)
CSN: [id number]     Arrival date & time 08/22/14  4132 History   First MD Initiated Contact with Patient 08/22/14 0848     Chief Complaint  Patient presents with  . Leg Pain      Patient is a 80 y.o. male presenting with leg pain. The history is provided by the patient.  Leg Pain Location:  Leg Injury: no   Leg location:  R upper leg Pain details:    Quality:  Aching   Severity:  Moderate   Onset quality:  Gradual   Timing:  Constant   Progression:  Worsening Chronicity:  New Relieved by:  Rest Worsened by:  Activity and bearing weight Ineffective treatments:  None tried Associated symptoms: no fever and no muscle weakness   Pt reports recent right knee pain (no trauma, no h/o surgery) and had "cortisone shot" in right knee last week that improved knee pain.  However over the weekend (past 2-3 days) he has noted pain in his right thigh and popliteal fossa.  No trauma.  No LE weakness/numbness.  There is no edema to his leg. He has never had this before.  He does report he heard a "pop" while moving in bed yesterday and this intensified the pain.  No h/o leg/knee surgery   Past Medical History  Diagnosis Date  . CAD (coronary artery disease)     s/p CABG 1993 (L-LAD, S-RI, S-D1);   echo 1/10: EF 55%;    myoview 12/09: inf MI, no ischemia  . Hyperlipidemia   . HTN (hypertension)   . Folliculitis   . Obesity   . Osteoarthritis   . Irritable bladder   . Family history of malignant neoplasm of gastrointestinal tract   . Myocardial infarction   . Candida esophagitis 2013    EGD   . Antral gastritis 2013    EGD   . Hiatal hernia   . Atrial fibrillation   . Anemia    Past Surgical History  Procedure Laterality Date  . Coronary artery bypass graft      graft '92: LIMA-LAD, SVG - D2,R1, D1  . Cholecystectomy      laparoscopic '92  . Mole removal  2001 and 2008  . Pilonidal cyst excision    . Enteroscopy  08/22/2012    Procedure: ENTEROSCOPY;  Surgeon: Juanita Craver, MD;   Location: [facility];  Service: Endoscopy;  Laterality: N/A;   Family History  Problem Relation Age of Onset  . Coronary artery disease Father     and brother  . Heart attack Father     and brother-fatal  . Stroke Father   . Breast cancer Mother   . Colon cancer Maternal Uncle   . Esophageal cancer Neg Hx   . Stomach cancer Neg Hx   . Rectal cancer Neg Hx   . Prostate cancer Neg Hx    History  Substance Use Topics  . Smoking status: Former Smoker -- 16 years    Quit date: 08/14/1964  . Smokeless tobacco: Never Used     Comment: quit 1965  . Alcohol Use: No    Review of Systems  Constitutional: Negative for fever.  Respiratory: Negative for shortness of breath.   Cardiovascular: Negative for chest pain.  Musculoskeletal: Positive for arthralgias.  Skin: Negative for color change and pallor.  Neurological: Negative for weakness and numbness.  All other systems reviewed and are negative.     Allergies  Review of patient's allergies indicates no known allergies.  Home Medications   Prior to Admission medications   Medication Sig Start Date End Date Taking? Authorizing Provider  amLODipine (NORVASC) 2.5 MG tablet Take 1 tablet (2.5 mg total) by mouth daily. 08/02/14   Lelon Perla, MD  atorvastatin (LIPITOR) 20 MG tablet Take one-half tablet by  mouth daily 07/27/14   Colon Branch, MD  benazepril (LOTENSIN) 40 MG tablet Take 1 tablet (40 mg total) by mouth daily. 01/13/14   Neena Rhymes, MD  Cholecalciferol (VITAMIN D) 1000 UNITS capsule Take 1,000 Units by mouth daily.      Historical Provider, MD  cyclobenzaprine (FLEXERIL) 5 MG tablet Take 1 at bedtime as needed for muscle relaxant. Caution: May cause drowsiness. 06/16/14   Jacqulyn Cane, MD  hydrocortisone (ANUSOL-HC) 25 MG suppository Place 1 suppository (25 mg total) rectally 2 (two) times daily. 06/01/14   Brunetta Jeans, PA-C  metoprolol succinate (TOPROL-XL) 25 MG 24 hr tablet Take 1 tablet (25 mg total) by  mouth daily. 08/02/14   Lelon Perla, MD  Multiple Vitamin (MULTIVITAMIN) capsule Take 1 capsule by mouth daily.      Historical Provider, MD  NITROSTAT 0.4 MG SL tablet USE AS DIRECTED    Lelon Perla, MD  pantoprazole (PROTONIX) 40 MG tablet Take 1 tablet (40 mg total) by mouth daily. 08/02/14   Lelon Perla, MD  Probiotic Product (PROBIOTIC DAILY PO) Take 1 tablet by mouth daily.    Historical Provider, MD  rivaroxaban (XARELTO) 20 MG TABS tablet Take 1 tablet by mouth  daily with supper 08/04/14   Lelon Perla, MD  sulfacetamide (BLEPH-10) 10 % ophthalmic solution Place 1-2 drops into the right eye every 3 (three) hours while awake. 03/25/14   Kandra Nicolas, MD  traMADol (ULTRAM) 50 MG tablet Take 1 tablet (50 mg total) by mouth every 6 (six) hours as needed for pain. 09/01/12   Sable Feil, MD  vitamin B-12 (CYANOCOBALAMIN) 1000 MCG tablet Take 1,000 mcg by mouth daily.    Historical Provider, MD   BP 167/85  Pulse 78  Temp(Src) 97.4 F (36.3 C) (Oral)  Resp 14  Ht 5' 10.5" (1.791 m)  Wt 215 lb (97.523 kg)  BMI 30.40 kg/m2  SpO2 96% Physical Exam CONSTITUTIONAL: Well developed/well nourished HEAD: Normocephalic/atraumatic EYES: EOMI ENMT: Mucous membranes moist NECK: supple no meningeal signs SPINE:entire spine nontender CV:  no murmurs/rubs/gallops noted LUNGS: Lungs are clear to auscultation bilaterally, no apparent distress ABDOMEN: soft, nontender, no rebound or guarding GU:no cva tenderness NEURO: Pt is awake/alert, moves all extremitiesx4 EXTREMITIES: pulses normal/equal in both feet.  The feet are warm to touch, full ROM He has tenderness to right posterior/distal thigh without erythema/warmth.  He has tenderness right popliteal fossa but no thrill noted.  There is no edema or erythema to right LE.  Minimal right calf tenderness noted. SKIN: warm, color normal PSYCH: no abnormalities of mood noted   ED Course  Procedures  9:29 AM Plan to obtain  US to r/o DVT 10:21 AM [place] negative Pt well appearing He is watching TV He is afebrile, no erythema/crepitus noted to leg Appropriate for d/c home  Imaging Review US Venous Img Lower Unilateral Right  08/22/2014   CLINICAL DATA:  Knee pain  EXAM: RIGHT LOWER EXTREMITY VENOUS DUPLEX ULTRASOUND  TECHNIQUE: Doppler venous assessment of the right lower extremity deep venous system was performed, including characterization of spectral flow, compressibility, and phasicity.  COMPARISON:  None.  FINDINGS: There is  complete compressibility of the right common femoral, femoral, and popliteal veins. Doppler analysis demonstrates respiratory phasicity and augmentation of flow upon calf compression.  IMPRESSION: No evidence of right lower extremity DVT.   Electronically Signed   By: Maryclare Bean M.D.   On: 08/22/2014 09:51      MDM   Final diagnoses:  Hamstring muscle strain, right, initial encounter    Nursing notes including past medical history and social history reviewed and considered in documentation xrays reviewed and considered     Sharyon Cable, MD 08/22/14 1022

## 2014-10-17 ENCOUNTER — Encounter: Payer: Self-pay | Admitting: Cardiology

## 2014-10-17 NOTE — Telephone Encounter (Signed)
This encounter was created in error - please disregard.

## 2014-10-17 NOTE — Telephone Encounter (Signed)
Pt called in wanting Hilda Blades to call him. He did not specify on what his issue was. Please call back  Thanks

## 2014-11-06 ENCOUNTER — Other Ambulatory Visit: Payer: Self-pay | Admitting: Cardiology

## 2014-11-07 NOTE — Telephone Encounter (Signed)
Pt called again,says he needs his medicine today please.

## 2014-11-07 NOTE — Telephone Encounter (Signed)
Pt still have received his Xarelto. Please call or send to [facility].Please call this in asap please

## 2014-11-09 ENCOUNTER — Other Ambulatory Visit: Payer: Self-pay

## 2014-11-11 ENCOUNTER — Telehealth: Payer: Self-pay | Admitting: *Deleted

## 2014-11-11 NOTE — Telephone Encounter (Signed)
Clearance for pt to have right knee scope microfracture and clearance for pt to hold xarelto 2 days prior to procedure and resume day after if okay with surgeon faxed to the number provided.

## 2014-11-15 ENCOUNTER — Telehealth: Payer: Self-pay | Admitting: *Deleted

## 2014-11-15 NOTE — Telephone Encounter (Signed)
Patient dropped off DMV from for handicap placard. Form filled out while patient was in office.  Surgical clearance form: Raliegh Ip for Right knee scope.

## 2014-11-15 NOTE — Telephone Encounter (Signed)
Surgical clearance appt scheduled for 11/16/14 @ 1:30 pm. JG//CMA

## 2014-11-16 ENCOUNTER — Ambulatory Visit (INDEPENDENT_AMBULATORY_CARE_PROVIDER_SITE_OTHER): Payer: Medicare Other | Admitting: Internal Medicine

## 2014-11-16 ENCOUNTER — Encounter: Payer: Self-pay | Admitting: Internal Medicine

## 2014-11-16 VITALS — BP 132/82 | HR 61 | Temp 98.1°F | Wt 230.5 lb

## 2014-11-16 DIAGNOSIS — M8949 Other hypertrophic osteoarthropathy, multiple sites: Secondary | ICD-10-CM

## 2014-11-16 DIAGNOSIS — M150 Primary generalized (osteo)arthritis: Secondary | ICD-10-CM

## 2014-11-16 DIAGNOSIS — M159 Polyosteoarthritis, unspecified: Secondary | ICD-10-CM

## 2014-11-16 DIAGNOSIS — I482 Chronic atrial fibrillation, unspecified: Secondary | ICD-10-CM

## 2014-11-16 DIAGNOSIS — Z01818 Encounter for other preprocedural examination: Secondary | ICD-10-CM

## 2014-11-16 NOTE — Patient Instructions (Signed)
Next visit is for a physical exam by 03-2015, fasting Please make an appointment

## 2014-11-16 NOTE — Progress Notes (Signed)
Pre visit review using our clinic review tool, if applicable. No additional management support is needed unless otherwise documented below in the visit note. 

## 2014-11-16 NOTE — Progress Notes (Signed)
Subjective:    Patient ID: [name], male    DOB: [birth year], 80 y.o.   MRN: [id number]  DOS:  11/16/2014 Type of visit - description : acute Interval history: Needs a scope and the right knee, having pain and swelling. Needs to be clear for surgery. Also, around October 2015 the dermatologist freeze a lesion at the back, still has a scab.   ROS Denies chest pain or difficulty breathing, mild lower extremity edema at baseline. No palpitation. No orthopnea, he is able to or his activities of daily living without getting short of breath, limiting factor is knee pain No nausea, vomiting, diarrhea or blood in the stools No cough, wheezing or sputum production  Past Medical History  Diagnosis Date  . CAD (coronary artery disease)     s/p CABG 1993 (L-LAD, S-RI, S-D1);   echo 1/10: EF 55%;    myoview 12/09: inf MI, no ischemia  . Hyperlipidemia   . HTN (hypertension)   . Folliculitis   . Obesity   . Osteoarthritis   . Irritable bladder   . Family history of malignant neoplasm of gastrointestinal tract   . Myocardial infarction   . Candida esophagitis 2013    EGD   . Antral gastritis 2013    EGD   . Hiatal hernia   . Atrial fibrillation   . Anemia     Past Surgical History  Procedure Laterality Date  . Coronary artery bypass graft      graft '92: LIMA-LAD, SVG - D2,R1, D1  . Cholecystectomy      laparoscopic '92  . Mole removal  2001 and 2008  . Pilonidal cyst excision    . Enteroscopy  08/22/2012    Procedure: ENTEROSCOPY;  Surgeon: Juanita Craver, MD;  Location: [facility];  Service: Endoscopy;  Laterality: N/A;    History   Social History  . Marital Status: Widowed    Spouse Name: N/A    Number of Children: 1  . Years of Education: N/A   Occupational History  . Retired    Social History Main Topics  . Smoking status: Former Smoker -- 16 years    Quit date: 08/14/1964  . Smokeless tobacco: Never Used     Comment: quit 1965  . Alcohol Use: No  . Drug  Use: No  . Sexual Activity: Not on file   Other Topics Concern  . 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 Married '57- widowed 03/22/23. 1 son -'106- died '55 MVA.    Retired: keeps himself busy.    Lives by himself        Medication List       This list is accurate as of: 11/16/14 11:59 PM.  Always use your most recent med list.               amLODipine 2.5 MG tablet  Commonly known as:  NORVASC  Take 1 tablet (2.5 mg total) by mouth daily.     atorvastatin 20 MG tablet  Commonly known as:  LIPITOR  Take one-half tablet by  mouth daily     benazepril 40 MG tablet  Commonly known as:  LOTENSIN  Take 1 tablet (40 mg total) by mouth daily.     cyclobenzaprine 5 MG tablet  Commonly known as:  FLEXERIL  Take 1 at bedtime as needed for muscle relaxant. Caution: May cause drowsiness.     hydrocortisone 25 MG suppository  Commonly known as:  ANUSOL-HC  Place 1 suppository (25 mg total) rectally 2 (two) times daily.     metoprolol succinate 25 MG 24 hr tablet  Commonly known as:  TOPROL-XL  Take 1 tablet (25 mg total) by mouth daily.     multivitamin capsule  Take 1 capsule by mouth daily.     NITROSTAT 0.4 MG SL tablet  Generic drug:  nitroGLYCERIN  USE AS DIRECTED     oxyCODONE-acetaminophen 5-325 MG per tablet  Commonly known as:  PERCOCET/ROXICET  Take 1 tablet by mouth every 6 (six) hours as needed for severe pain.     pantoprazole 40 MG tablet  Commonly known as:  PROTONIX  Take 1 tablet (40 mg total) by mouth daily.     PROBIOTIC DAILY PO  Take 1 tablet by mouth daily.     sulfacetamide 10 % ophthalmic solution  Commonly known as:  BLEPH-10  Place 1-2 drops into the right eye every 3 (three) hours while awake.     vitamin B-12 1000 MCG tablet  Commonly known as:  CYANOCOBALAMIN  Take 1,000 mcg by mouth daily.     Vitamin D 1000 UNITS capsule  Take 1,000 Units by mouth daily.     XARELTO 20 MG Tabs tablet  Generic drug:   rivaroxaban  Take 1 tablet by mouth  daily with supper           Objective:   Physical Exam  Skin:      BP 132/82 mmHg  Pulse 61  Temp(Src) 98.1 F (36.7 C) (Oral)  Wt 230 lb 8 oz (104.554 kg)  SpO2 95% General -- alert, well-developed, NAD.  Neck --no JVD at 45 HEENT-- Not pale.  Lungs -- normal respiratory effort, no intercostal retractions, no accessory muscle use, and decreased breath sounds.  Heart-- irreg.  Abdomen-- Not distended, good bowel sounds,soft, non-tender. Extremities-- trace- symmetric pretibial edema bilaterally  Neurologic--  alert & oriented X3. Speech normal, gait appropriate for age, strength symmetric and appropriate for age.  Psych-- Cognition and judgment appear intact. Cooperative with normal attention span and concentration. No anxious or depressed appearing.     Assessment & Plan:  DJD Needs a knee scope Atrial fibrillation Samples of xarelto provided Skin lesions, Recommend observation, watch for skin changes in the back, see dermatology if necessary

## 2014-11-17 DIAGNOSIS — Z01818 Encounter for other preprocedural examination: Secondary | ICD-10-CM | POA: Insufficient documentation

## 2014-11-17 NOTE — Assessment & Plan Note (Signed)
Surgical clearance 80 year old gentleman, former smoker, history of CAD, needs a knee scope. Cardiopulmonary review of systems essentially negative. Chart reviewed, labs are satisfactory. Last echocardiogram in April of 2012 showed an ejection fraction of 45-50% and mild left atrial enlargement.  Nuclear study in June 2014 showed scar in the mid/apical inferior wall and apex. There was a small area of ischemia in the base to mid anterior wall. The study was unchanged compared to May of 2012.   He seems to be doing well, I  will discuss with cardiology Addendum: Discuss with cardiology, okay to proceed with surgery, stop xarelto 3 days before surgery and restart as soon as possible.

## 2014-11-24 ENCOUNTER — Telehealth: Payer: Self-pay

## 2014-11-24 NOTE — Telephone Encounter (Signed)
Pt asked for samples of Xarelto 20 mg at appt on 12/9, informed Pt then we were currently out. Informed him I would call once we got samples in. Called Pt and informed him I have bagged several bottles for him and placed them at the front desk for pick up. Pt states he would be by tomorrow to pick them up.

## 2014-11-28 ENCOUNTER — Emergency Department
Admission: EM | Admit: 2014-11-28 | Discharge: 2014-11-28 | Disposition: A | Discharge status: Home / Self Care | Payer: Medicare Other | Source: Home / Self Care | Attending: Emergency Medicine | Admitting: Emergency Medicine

## 2014-11-28 ENCOUNTER — Encounter: Payer: Self-pay | Admitting: *Deleted

## 2014-11-28 DIAGNOSIS — J209 Acute bronchitis, unspecified: Secondary | ICD-10-CM

## 2014-11-28 DIAGNOSIS — J069 Acute upper respiratory infection, unspecified: Secondary | ICD-10-CM

## 2014-11-28 MED ORDER — AZITHROMYCIN 250 MG PO TABS: ORAL_TABLET | ORAL | Status: DC

## 2014-11-28 NOTE — ED Provider Notes (Signed)
CSN: [id number]     Arrival date & time 11/28/14  1740 History   First MD Initiated Contact with Patient 11/28/14 1757     Chief Complaint  Patient presents with  . Cough   (Consider location/radiation/quality/duration/timing/severity/associated sxs/prior Treatment) HPI SINUSITIS  Onset: 7 days Facial/sinus pressure with discolored nasal mucus.    Severity: moderate Tried OTC meds without significant relief.  Symptoms:  + Fever  + URI prodrome with nasal congestion + Minimal swollen neck glands + mild Sinus Headache + mild ear pressure  No Allergy symptoms No significant Sore Throat No eye symptoms     +Cough No chest pain No shortness of breath  No wheezing  No Abdominal Pain No Nausea No Vomiting No diarrhea  No Myalgias No focal neurologic symptoms No syncope No Rash  No Urinary symptoms   Past Medical History  Diagnosis Date  . CAD (coronary artery disease)     s/p CABG 1993 (L-LAD, S-RI, S-D1);   echo 1/10: EF 55%;    myoview 12/09: inf MI, no ischemia  . Hyperlipidemia   . HTN (hypertension)   . Folliculitis   . Obesity   . Osteoarthritis   . Irritable bladder   . Family history of malignant neoplasm of gastrointestinal tract   . Myocardial infarction   . Candida esophagitis 2013    EGD   . Antral gastritis 2013    EGD   . Hiatal hernia   . Atrial fibrillation   . Anemia    Past Surgical History  Procedure Laterality Date  . Coronary artery bypass graft      graft '92: LIMA-LAD, SVG - D2,R1, D1  . Cholecystectomy      laparoscopic '92  . Mole removal  2001 and 2008  . Pilonidal cyst excision    . Enteroscopy  08/22/2012    Procedure: ENTEROSCOPY;  Surgeon: Juanita Craver, MD;  Location: [facility];  Service: Endoscopy;  Laterality: N/A;   Family History  Problem Relation Age of Onset  . Coronary artery disease Father     and brother  . Heart attack Father     and brother-fatal  . Stroke Father   . Breast cancer Mother   .  Alzheimer's disease Mother   . Colon cancer Maternal Uncle   . Esophageal cancer Neg Hx   . Stomach cancer Neg Hx   . Rectal cancer Neg Hx   . Prostate cancer Neg Hx    History  Substance Use Topics  . Smoking status: Former Smoker -- 16 years    Quit date: 08/14/1964  . Smokeless tobacco: Never Used     Comment: quit 1965  . Alcohol Use: No    Review of Systems  All other systems reviewed and are negative.   Allergies  Review of patient's allergies indicates no known allergies.  Home Medications   Prior to Admission medications   Medication Sig Start Date End Date Taking? Authorizing Provider  amLODipine (NORVASC) 2.5 MG tablet Take 1 tablet (2.5 mg total) by mouth daily. 08/02/14   Lelon Perla, MD  atorvastatin (LIPITOR) 20 MG tablet Take one-half tablet by  mouth daily 07/27/14   Colon Branch, MD  azithromycin (ZITHROMAX Z-PAK) 250 MG tablet Take 2 tablets on day one, then 1 tablet daily on days 2 through 5 11/28/14   Jacqulyn Cane, MD  benazepril (LOTENSIN) 40 MG tablet Take 1 tablet (40 mg total) by mouth daily. 01/13/14   Neena Rhymes, MD  Cholecalciferol (  VITAMIN D) 1000 UNITS capsule Take 1,000 Units by mouth daily.      Historical Provider, MD  cyclobenzaprine (FLEXERIL) 5 MG tablet Take 1 at bedtime as needed for muscle relaxant. Caution: May cause drowsiness. Patient not taking: Reported on 11/16/2014 06/16/14   Jacqulyn Cane, MD  hydrocortisone (ANUSOL-HC) 25 MG suppository Place 1 suppository (25 mg total) rectally 2 (two) times daily. Patient not taking: Reported on 11/16/2014 06/01/14   Brunetta Jeans, PA-C  metoprolol succinate (TOPROL-XL) 25 MG 24 hr tablet Take 1 tablet (25 mg total) by mouth daily. 08/02/14   Lelon Perla, MD  Multiple Vitamin (MULTIVITAMIN) capsule Take 1 capsule by mouth daily.      Historical Provider, MD  NITROSTAT 0.4 MG SL tablet USE AS DIRECTED Patient not taking: Reported on 11/16/2014    Lelon Perla, MD  oxyCODONE-acetaminophen  (PERCOCET/ROXICET) 5-325 MG per tablet Take 1 tablet by mouth every 6 (six) hours as needed for severe pain. Patient not taking: Reported on 11/16/2014 08/22/14   Sharyon Cable, MD  pantoprazole (PROTONIX) 40 MG tablet Take 1 tablet (40 mg total) by mouth daily. 08/02/14   Lelon Perla, MD  Probiotic Product (PROBIOTIC DAILY PO) Take 1 tablet by mouth daily.    Historical Provider, MD  sulfacetamide (BLEPH-10) 10 % ophthalmic solution Place 1-2 drops into the right eye every 3 (three) hours while awake. 03/25/14   Kandra Nicolas, MD  vitamin B-12 (CYANOCOBALAMIN) 1000 MCG tablet Take 1,000 mcg by mouth daily.    Historical Provider, MD  XARELTO 20 MG TABS tablet Take 1 tablet by mouth  daily with supper 11/07/14   Lelon Perla, MD   BP 135/78 mmHg  Pulse 71  Temp(Src) 97.8 F (36.6 C) (Oral)  Resp 18  Ht 5\' 11"  (1.803 m)  Wt 220 lb (99.791 kg)  BMI 30.70 kg/m2  SpO2 96% Physical Exam  Constitutional: He is oriented to person, place, and time. He appears well-developed and well-nourished. No distress.  HENT:  Head: Normocephalic and atraumatic.  Right Ear: Tympanic membrane, external ear and ear canal normal.  Left Ear: Tympanic membrane, external ear and ear canal normal.  Nose: Mucosal edema and rhinorrhea present. Right sinus exhibits maxillary sinus tenderness. Left sinus exhibits maxillary sinus tenderness.  Mouth/Throat: Oropharynx is clear and moist. No oral lesions. No oropharyngeal exudate.  Eyes: Right eye exhibits no discharge. Left eye exhibits no discharge. No scleral icterus.  Neck: Neck supple.  Cardiovascular: Normal rate, regular rhythm and normal heart sounds.   Pulmonary/Chest: Effort normal. No respiratory distress. He has no wheezes. He has rhonchi. He has no rales.  Lymphadenopathy:    He has no cervical adenopathy.  Neurological: He is alert and oriented to person, place, and time.  Skin: Skin is warm and dry.  Nursing note and vitals reviewed.   ED  Course  Procedures (including critical care time) Labs Review Labs Reviewed - No data to display  Imaging Review No results found.   MDM   1. Acute upper respiratory infection   2. Acute bronchitis, unspecified organism    Treatment options discussed, as well as risks, benefits, alternatives. Patient voiced understanding and agreement with the following plans:  New Prescriptions   AZITHROMYCIN (ZITHROMAX Z-PAK) 250 MG TABLET    Take 2 tablets on day one, then 1 tablet daily on days 2 through 5   He prefers to use Delsym cough syrup and declined any prescription cough med or any other  prescriptions. Other symptomatic care discussed Follow-up with your primary care doctor in 5-7 days if not improving, or sooner if symptoms become worse. Precautions discussed. Red flags discussed. Questions invited and answered. Patient voiced understanding and agreement.   Jacqulyn Cane, MD 11/28/14 [phone]

## 2014-11-28 NOTE — ED Notes (Signed)
Pt c/o productive cough, chest congestion, and runny nose x 6 days. denies fever. He has taken Mucinex.

## 2014-12-14 DIAGNOSIS — M94261 Chondromalacia, right knee: Secondary | ICD-10-CM | POA: Diagnosis not present

## 2014-12-14 DIAGNOSIS — S83231A Complex tear of medial meniscus, current injury, right knee, initial encounter: Secondary | ICD-10-CM | POA: Diagnosis not present

## 2014-12-14 DIAGNOSIS — Y929 Unspecified place or not applicable: Secondary | ICD-10-CM | POA: Diagnosis not present

## 2014-12-14 DIAGNOSIS — M1990 Unspecified osteoarthritis, unspecified site: Secondary | ICD-10-CM | POA: Diagnosis not present

## 2014-12-14 DIAGNOSIS — X58XXXA Exposure to other specified factors, initial encounter: Secondary | ICD-10-CM | POA: Diagnosis not present

## 2014-12-14 DIAGNOSIS — S83241A Other tear of medial meniscus, current injury, right knee, initial encounter: Secondary | ICD-10-CM | POA: Diagnosis not present

## 2014-12-17 ENCOUNTER — Telehealth: Payer: Self-pay | Admitting: Physician Assistant

## 2014-12-17 NOTE — Telephone Encounter (Signed)
    Paitent called after hours because feeling dizzy and high BPs. BP 166/91 and HR 61. By the time i called him back he was feeling much better with no more dizziness. I told him he could take 1/2-1 extra amlodipine for high BPs. He agreed and will call back if sx worsen  Angelena Form PA-C  [facility]

## 2014-12-19 ENCOUNTER — Telehealth: Payer: Self-pay | Admitting: Cardiology

## 2014-12-19 NOTE — Telephone Encounter (Signed)
Pease call asap,pt heart is irregular

## 2014-12-19 NOTE — Telephone Encounter (Signed)
Spoke with grace, she is helping take care of the patient after knee surgery. She called to confirm the patients irregular pulse is okay. Reassurance given to caregiver, due to atrial fib his heart rate will be irregular.

## 2014-12-21 DIAGNOSIS — M25561 Pain in right knee: Secondary | ICD-10-CM | POA: Diagnosis not present

## 2014-12-21 DIAGNOSIS — M25661 Stiffness of right knee, not elsewhere classified: Secondary | ICD-10-CM | POA: Diagnosis not present

## 2014-12-21 DIAGNOSIS — R262 Difficulty in walking, not elsewhere classified: Secondary | ICD-10-CM | POA: Diagnosis not present

## 2014-12-21 DIAGNOSIS — S8331XD Tear of articular cartilage of right knee, current, subsequent encounter: Secondary | ICD-10-CM | POA: Diagnosis not present

## 2014-12-23 DIAGNOSIS — S8331XD Tear of articular cartilage of right knee, current, subsequent encounter: Secondary | ICD-10-CM | POA: Diagnosis not present

## 2014-12-23 DIAGNOSIS — R262 Difficulty in walking, not elsewhere classified: Secondary | ICD-10-CM | POA: Diagnosis not present

## 2014-12-23 DIAGNOSIS — M25561 Pain in right knee: Secondary | ICD-10-CM | POA: Diagnosis not present

## 2014-12-23 DIAGNOSIS — M25661 Stiffness of right knee, not elsewhere classified: Secondary | ICD-10-CM | POA: Diagnosis not present

## 2014-12-28 DIAGNOSIS — M25661 Stiffness of right knee, not elsewhere classified: Secondary | ICD-10-CM | POA: Diagnosis not present

## 2014-12-28 DIAGNOSIS — M25561 Pain in right knee: Secondary | ICD-10-CM | POA: Diagnosis not present

## 2014-12-28 DIAGNOSIS — S8331XD Tear of articular cartilage of right knee, current, subsequent encounter: Secondary | ICD-10-CM | POA: Diagnosis not present

## 2014-12-28 DIAGNOSIS — R262 Difficulty in walking, not elsewhere classified: Secondary | ICD-10-CM | POA: Diagnosis not present

## 2014-12-29 ENCOUNTER — Telehealth: Payer: Self-pay | Admitting: *Deleted

## 2014-12-29 NOTE — Telephone Encounter (Signed)
Patient dropped off form from [facility] for a health questionnaire to work out/exercise. Forwarded to Dr. Larose Kells. JG//CMA

## 2015-01-04 ENCOUNTER — Other Ambulatory Visit: Payer: Self-pay | Admitting: *Deleted

## 2015-01-04 DIAGNOSIS — R262 Difficulty in walking, not elsewhere classified: Secondary | ICD-10-CM | POA: Diagnosis not present

## 2015-01-04 DIAGNOSIS — S8331XD Tear of articular cartilage of right knee, current, subsequent encounter: Secondary | ICD-10-CM | POA: Diagnosis not present

## 2015-01-04 DIAGNOSIS — M25561 Pain in right knee: Secondary | ICD-10-CM | POA: Diagnosis not present

## 2015-01-04 DIAGNOSIS — M25661 Stiffness of right knee, not elsewhere classified: Secondary | ICD-10-CM | POA: Diagnosis not present

## 2015-01-04 MED ORDER — NITROGLYCERIN 0.4 MG SL SUBL: 0.4000 mg | SUBLINGUAL_TABLET | SUBLINGUAL | Status: DC | PRN

## 2015-01-04 NOTE — Telephone Encounter (Signed)
Patient came by and picked up completed form. Copy sent for scanning. JG//CMA

## 2015-01-11 DIAGNOSIS — M25561 Pain in right knee: Secondary | ICD-10-CM | POA: Diagnosis not present

## 2015-01-11 DIAGNOSIS — M25661 Stiffness of right knee, not elsewhere classified: Secondary | ICD-10-CM | POA: Diagnosis not present

## 2015-01-11 DIAGNOSIS — S8331XD Tear of articular cartilage of right knee, current, subsequent encounter: Secondary | ICD-10-CM | POA: Diagnosis not present

## 2015-01-11 DIAGNOSIS — R262 Difficulty in walking, not elsewhere classified: Secondary | ICD-10-CM | POA: Diagnosis not present

## 2015-01-17 ENCOUNTER — Other Ambulatory Visit: Payer: Self-pay

## 2015-01-17 MED ORDER — BENAZEPRIL HCL 40 MG PO TABS: 40.0000 mg | ORAL_TABLET | Freq: Every day | ORAL | Status: DC

## 2015-01-19 DIAGNOSIS — R262 Difficulty in walking, not elsewhere classified: Secondary | ICD-10-CM | POA: Diagnosis not present

## 2015-01-19 DIAGNOSIS — M25661 Stiffness of right knee, not elsewhere classified: Secondary | ICD-10-CM | POA: Diagnosis not present

## 2015-01-19 DIAGNOSIS — S8331XD Tear of articular cartilage of right knee, current, subsequent encounter: Secondary | ICD-10-CM | POA: Diagnosis not present

## 2015-01-19 DIAGNOSIS — M25561 Pain in right knee: Secondary | ICD-10-CM | POA: Diagnosis not present

## 2015-01-24 DIAGNOSIS — H43813 Vitreous degeneration, bilateral: Secondary | ICD-10-CM | POA: Diagnosis not present

## 2015-01-24 DIAGNOSIS — H3531 Nonexudative age-related macular degeneration: Secondary | ICD-10-CM | POA: Diagnosis not present

## 2015-01-24 DIAGNOSIS — H2513 Age-related nuclear cataract, bilateral: Secondary | ICD-10-CM | POA: Diagnosis not present

## 2015-01-24 DIAGNOSIS — H40003 Preglaucoma, unspecified, bilateral: Secondary | ICD-10-CM | POA: Diagnosis not present

## 2015-01-25 DIAGNOSIS — R262 Difficulty in walking, not elsewhere classified: Secondary | ICD-10-CM | POA: Diagnosis not present

## 2015-01-25 DIAGNOSIS — S8331XD Tear of articular cartilage of right knee, current, subsequent encounter: Secondary | ICD-10-CM | POA: Diagnosis not present

## 2015-01-25 DIAGNOSIS — M25661 Stiffness of right knee, not elsewhere classified: Secondary | ICD-10-CM | POA: Diagnosis not present

## 2015-01-25 DIAGNOSIS — M25561 Pain in right knee: Secondary | ICD-10-CM | POA: Diagnosis not present

## 2015-02-01 DIAGNOSIS — M25561 Pain in right knee: Secondary | ICD-10-CM | POA: Diagnosis not present

## 2015-02-01 DIAGNOSIS — R262 Difficulty in walking, not elsewhere classified: Secondary | ICD-10-CM | POA: Diagnosis not present

## 2015-02-01 DIAGNOSIS — S8331XD Tear of articular cartilage of right knee, current, subsequent encounter: Secondary | ICD-10-CM | POA: Diagnosis not present

## 2015-02-01 DIAGNOSIS — M25661 Stiffness of right knee, not elsewhere classified: Secondary | ICD-10-CM | POA: Diagnosis not present

## 2015-02-14 ENCOUNTER — Emergency Department (HOSPITAL_BASED_OUTPATIENT_CLINIC_OR_DEPARTMENT_OTHER)
Admission: EM | Admit: 2015-02-14 | Discharge: 2015-02-14 | Disposition: A | Discharge status: Home / Self Care | Payer: Medicare Other | Attending: Emergency Medicine | Admitting: Emergency Medicine

## 2015-02-14 ENCOUNTER — Encounter (HOSPITAL_BASED_OUTPATIENT_CLINIC_OR_DEPARTMENT_OTHER): Payer: Self-pay

## 2015-02-14 ENCOUNTER — Emergency Department (HOSPITAL_BASED_OUTPATIENT_CLINIC_OR_DEPARTMENT_OTHER): Payer: Medicare Other

## 2015-02-14 DIAGNOSIS — Z872 Personal history of diseases of the skin and subcutaneous tissue: Secondary | ICD-10-CM | POA: Diagnosis not present

## 2015-02-14 DIAGNOSIS — Z23 Encounter for immunization: Secondary | ICD-10-CM | POA: Diagnosis not present

## 2015-02-14 DIAGNOSIS — Y9389 Activity, other specified: Secondary | ICD-10-CM | POA: Diagnosis not present

## 2015-02-14 DIAGNOSIS — Z951 Presence of aortocoronary bypass graft: Secondary | ICD-10-CM | POA: Insufficient documentation

## 2015-02-14 DIAGNOSIS — E785 Hyperlipidemia, unspecified: Secondary | ICD-10-CM | POA: Insufficient documentation

## 2015-02-14 DIAGNOSIS — Y998 Other external cause status: Secondary | ICD-10-CM | POA: Diagnosis not present

## 2015-02-14 DIAGNOSIS — S5012XA Contusion of left forearm, initial encounter: Secondary | ICD-10-CM

## 2015-02-14 DIAGNOSIS — S60512A Abrasion of left hand, initial encounter: Secondary | ICD-10-CM

## 2015-02-14 DIAGNOSIS — I4891 Unspecified atrial fibrillation: Secondary | ICD-10-CM | POA: Insufficient documentation

## 2015-02-14 DIAGNOSIS — Z792 Long term (current) use of antibiotics: Secondary | ICD-10-CM | POA: Insufficient documentation

## 2015-02-14 DIAGNOSIS — Z862 Personal history of diseases of the blood and blood-forming organs and certain disorders involving the immune mechanism: Secondary | ICD-10-CM | POA: Insufficient documentation

## 2015-02-14 DIAGNOSIS — Z8719 Personal history of other diseases of the digestive system: Secondary | ICD-10-CM | POA: Diagnosis not present

## 2015-02-14 DIAGNOSIS — E669 Obesity, unspecified: Secondary | ICD-10-CM | POA: Insufficient documentation

## 2015-02-14 DIAGNOSIS — I10 Essential (primary) hypertension: Secondary | ICD-10-CM | POA: Diagnosis not present

## 2015-02-14 DIAGNOSIS — Z7901 Long term (current) use of anticoagulants: Secondary | ICD-10-CM | POA: Diagnosis not present

## 2015-02-14 DIAGNOSIS — M1990 Unspecified osteoarthritis, unspecified site: Secondary | ICD-10-CM | POA: Insufficient documentation

## 2015-02-14 DIAGNOSIS — Z8619 Personal history of other infectious and parasitic diseases: Secondary | ICD-10-CM | POA: Diagnosis not present

## 2015-02-14 DIAGNOSIS — Z87891 Personal history of nicotine dependence: Secondary | ICD-10-CM | POA: Diagnosis not present

## 2015-02-14 DIAGNOSIS — S6992XA Unspecified injury of left wrist, hand and finger(s), initial encounter: Secondary | ICD-10-CM | POA: Diagnosis present

## 2015-02-14 DIAGNOSIS — S51812A Laceration without foreign body of left forearm, initial encounter: Secondary | ICD-10-CM | POA: Diagnosis not present

## 2015-02-14 DIAGNOSIS — I252 Old myocardial infarction: Secondary | ICD-10-CM | POA: Diagnosis not present

## 2015-02-14 DIAGNOSIS — Y92410 Unspecified street and highway as the place of occurrence of the external cause: Secondary | ICD-10-CM | POA: Insufficient documentation

## 2015-02-14 DIAGNOSIS — I2510 Atherosclerotic heart disease of native coronary artery without angina pectoris: Secondary | ICD-10-CM | POA: Diagnosis not present

## 2015-02-14 DIAGNOSIS — Z79899 Other long term (current) drug therapy: Secondary | ICD-10-CM | POA: Diagnosis not present

## 2015-02-14 MED ORDER — TETANUS-DIPHTH-ACELL PERTUSSIS 5-2.5-18.5 LF-MCG/0.5 IM SUSP
0.5000 mL | Freq: Once | INTRAMUSCULAR | Status: AC
  Administered 2015-02-14: 0.5 mL via INTRAMUSCULAR
  Filled 2015-02-14: qty 0.5
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
## 2015-02-16 NOTE — ED Notes (Signed)
Patient and a friend called to request information on where to purchase vaseoline gauze.  States the skin tear wound has continued to bleed for the last two days and they are now out of supplies.  States today they purchased non adherent gauze to replace the vaseoline gauze.  Reviewed with patient to apply neosporin, non adherent dressing, 4 x 4 gauze and a bulky wrap to apply enough pressure to stop the bleeding.  Friend of the patient stated that she had not been applying any pressure to the wound

## 2015-02-17 ENCOUNTER — Encounter: Payer: Self-pay | Admitting: *Deleted

## 2015-02-17 ENCOUNTER — Emergency Department (INDEPENDENT_AMBULATORY_CARE_PROVIDER_SITE_OTHER)
Admission: EM | Admit: 2015-02-17 | Discharge: 2015-02-17 | Disposition: A | Discharge status: Home / Self Care | Payer: Self-pay | Source: Home / Self Care | Attending: Emergency Medicine | Admitting: Emergency Medicine

## 2015-02-17 DIAGNOSIS — S50852A Superficial foreign body of left forearm, initial encounter: Secondary | ICD-10-CM

## 2015-02-17 DIAGNOSIS — S41112S Laceration without foreign body of left upper arm, sequela: Secondary | ICD-10-CM

## 2015-02-17 DIAGNOSIS — L089 Local infection of the skin and subcutaneous tissue, unspecified: Secondary | ICD-10-CM

## 2015-02-17 MED ORDER — CEFTRIAXONE SODIUM 1 G IJ SOLR
1.0000 g | Freq: Once | INTRAMUSCULAR | Status: AC
  Administered 2015-02-17: 1 g via INTRAMUSCULAR

## 2015-02-17 MED ORDER — SULFAMETHOXAZOLE-TRIMETHOPRIM 800-160 MG PO TABS: 1.0000 | ORAL_TABLET | Freq: Two times a day (BID) | ORAL | Status: AC
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
## 2015-02-17 NOTE — Discharge Instructions (Signed)
Wound Infection °A wound infection happens when a type of germ (bacteria) starts growing in the wound. In some cases, this can cause the wound to break open. If cared for properly, the infected wound will heal from the inside to the outside. Wound infections need treatment. °CAUSES °An infection is caused by bacteria growing in the wound.  °SYMPTOMS  °· Increase in redness, swelling, or pain at the wound site. °· Increase in drainage at the wound site. °· Wound or bandage (dressing) starts to smell bad. °· Fever. °· Feeling tired or fatigued. °· Pus draining from the wound. °TREATMENT  °Your health care provider will prescribe antibiotic medicine. The wound infection should improve within 24 to 48 hours. Any redness around the wound should stop spreading and the wound should be less painful.  °HOME CARE INSTRUCTIONS  °· Only take over-the-counter or prescription medicines for pain, discomfort, or fever as directed by your health care provider. °· Take your antibiotics as directed. Finish them even if you start to feel better. °· Gently wash the area with mild soap and water 2 times a day, or as directed. Rinse off the soap. Pat the area dry with a clean towel. Do not rub the wound. This may cause bleeding. °· Follow your health care provider's instructions for how often you need to change the dressing. °· Apply ointment and a dressing to the wound as directed. °· If the dressing sticks, moisten it with soapy water and gently remove it. °· Change the bandage right away if it becomes wet, dirty, or develops a bad smell. °· Take showers. Do not take tub baths, swim, or do anything that may soak the wound until it is healed. °· Avoid exercises that make you sweat heavily. °· Use anti-itch medicine as directed by your health care provider. The wound may itch when it is healing. Do not pick or scratch at the wound. °· Follow up with your health care provider to get your wound rechecked as directed. °SEEK MEDICAL CARE  IF: °· You have an increase in swelling, pain, or redness around the wound. °· You have an increase in the amount of pus coming from the wound. °· There is a bad smell coming from the wound. °· More of the wound breaks open. °· You have a fever. °MAKE SURE YOU:  °· Understand these instructions. °· Will watch your condition. °· Will get help right away if you are not doing well or get worse. °Document Released: 08/24/2003 Document Revised: 11/30/2013 Document Reviewed: 03/31/2011 °[employer]® Patient Information ©2015 [employer]. This information is not intended to replace advice given to you by your health care provider. Make sure you discuss any questions you have with your health care provider. ° °

## 2015-02-18 ENCOUNTER — Emergency Department
Admission: EM | Admit: 2015-02-18 | Discharge: 2015-02-18 | Discharge status: Absent without leave | Payer: PRIVATE HEALTH INSURANCE | Source: Home / Self Care

## 2015-02-18 ENCOUNTER — Encounter: Payer: Self-pay | Admitting: Emergency Medicine

## 2015-02-18 ENCOUNTER — Emergency Department (INDEPENDENT_AMBULATORY_CARE_PROVIDER_SITE_OTHER)
Admission: EM | Admit: 2015-02-18 | Discharge: 2015-02-18 | Disposition: A | Discharge status: Home / Self Care | Payer: Medicare Other | Source: Home / Self Care | Attending: Family Medicine | Admitting: Family Medicine

## 2015-02-18 DIAGNOSIS — S40812D Abrasion of left upper arm, subsequent encounter: Secondary | ICD-10-CM

## 2015-02-18 DIAGNOSIS — L03114 Cellulitis of left upper limb: Secondary | ICD-10-CM | POA: Diagnosis not present
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
## 2015-02-18 NOTE — ED Provider Notes (Signed)
CSN: [id number]     Arrival date & time 02/18/15  1884 History   First MD Initiated Contact with Patient 02/18/15 [id number]     Chief Complaint  Patient presents with  . Wound Check      HPI Comments: Patient returns for dressing change on wounds on left arm/hand.  He reports minimal pain.  No fevers, chills, and sweats   The history is provided by the patient.    Past Medical History  Diagnosis Date  . CAD (coronary artery disease)     s/p CABG 1993 (L-LAD, S-RI, S-D1);   echo 1/10: EF 55%;    myoview 12/09: inf MI, no ischemia  . Hyperlipidemia   . HTN (hypertension)   . Folliculitis   . Obesity   . Osteoarthritis   . Irritable bladder   . Family history of malignant neoplasm of gastrointestinal tract   . Myocardial infarction   . Candida esophagitis 2013    EGD   . Antral gastritis 2013    EGD   . Hiatal hernia   . Atrial fibrillation   . Anemia    Past Surgical History  Procedure Laterality Date  . Coronary artery bypass graft      graft '92: LIMA-LAD, SVG - D2,R1, D1  . Cholecystectomy      laparoscopic '92  . Mole removal  2001 and 2008  . Pilonidal cyst excision    . Enteroscopy  08/22/2012    Procedure: ENTEROSCOPY;  Surgeon: Juanita Craver, MD;  Location: [facility];  Service: Endoscopy;  Laterality: N/A;  . Knee surgery     Family History  Problem Relation Age of Onset  . Coronary artery disease Father     and brother  . Heart attack Father     and brother-fatal  . Stroke Father   . Breast cancer Mother   . Alzheimer's disease Mother   . Colon cancer Maternal Uncle   . Esophageal cancer Neg Hx   . Stomach cancer Neg Hx   . Rectal cancer Neg Hx   . Prostate cancer Neg Hx    History  Substance Use Topics  . Smoking status: Former Smoker -- 16 years    Quit date: 08/14/1964  . Smokeless tobacco: Never Used     Comment: quit 1965  . Alcohol Use: No    Review of Systems  Constitutional: Negative for fever, chills, activity change and fatigue.  All  other systems reviewed and are negative.   Allergies  Review of patient's allergies indicates no known allergies.  Home Medications   Prior to Admission medications   Medication Sig Start Date End Date Taking? Authorizing Provider  amLODipine (NORVASC) 2.5 MG tablet Take 1 tablet (2.5 mg total) by mouth daily. 08/02/14   Lelon Perla, MD  atorvastatin (LIPITOR) 20 MG tablet Take one-half tablet by  mouth daily 07/27/14   Colon Branch, MD  benazepril (LOTENSIN) 40 MG tablet Take 1 tablet (40 mg total) by mouth daily. 01/17/15   Colon Branch, MD  Cholecalciferol (VITAMIN D) 1000 UNITS capsule Take 1,000 Units by mouth daily.      Historical Provider, MD  cyclobenzaprine (FLEXERIL) 5 MG tablet Take 1 at bedtime as needed for muscle relaxant. Caution: May cause drowsiness. Patient not taking: Reported on 11/16/2014 06/16/14   Jacqulyn Cane, MD  hydrocortisone (ANUSOL-HC) 25 MG suppository Place 1 suppository (25 mg total) rectally 2 (two) times daily. Patient not taking: Reported on 11/16/2014 06/01/14   Luanna Cole  Hassell Done, PA-C  metoprolol succinate (TOPROL-XL) 25 MG 24 hr tablet Take 1 tablet (25 mg total) by mouth daily. 08/02/14   Lelon Perla, MD  Multiple Vitamin (MULTIVITAMIN) capsule Take 1 capsule by mouth daily.      Historical Provider, MD  nitroGLYCERIN (NITROSTAT) 0.4 MG SL tablet Place 1 tablet (0.4 mg total) under the tongue every 5 (five) minutes as needed for chest pain. 01/04/15   Lelon Perla, MD  pantoprazole (PROTONIX) 40 MG tablet Take 1 tablet (40 mg total) by mouth daily. 08/02/14   Lelon Perla, MD  Probiotic Product (PROBIOTIC DAILY PO) Take 1 tablet by mouth daily.    Historical Provider, MD  sulfacetamide (BLEPH-10) 10 % ophthalmic solution Place 1-2 drops into the right eye every 3 (three) hours while awake. 03/25/14   Kandra Nicolas, MD  sulfamethoxazole-trimethoprim (BACTRIM DS,SEPTRA DS) 800-160 MG per tablet Take 1 tablet by mouth 2 (two) times daily. 02/17/15 02/24/15   Fransico Meadow, PA-C  vitamin B-12 (CYANOCOBALAMIN) 1000 MCG tablet Take 1,000 mcg by mouth daily.    Historical Provider, MD  XARELTO 20 MG TABS tablet Take 1 tablet by mouth  daily with supper 11/07/14   Lelon Perla, MD   BP 135/81 mmHg  Pulse 76  Temp(Src) 97.7 F (36.5 C) (Oral)  Resp 16  SpO2 97% Physical Exam  Constitutional: He appears well-developed and well-nourished. No distress.  HENT:  Head: Atraumatic.  Musculoskeletal:       Right shoulder: He exhibits tenderness and swelling.       Arms: Left forearm has an approximately 6cm diameter skin tear/abrasion over a hematoma as noted on diagram.  There is resolving erythema around the wound with minimal tenderness.  No purulent drainage.  Left hand has an approximately 4cm diameter skin tear/abrasion as noted on diagram.  There is resolving erythema around the wound with minimal tenderness.                                                                                        ED Course  Procedures  none     MDM   1. Abrasion of left arm, subsequent encounter   2. Cellulitis of left arm    Wound culture pending  Wounds irrigated with sterile saline.  Applied silvadene cream and Mepelex Border dressings. Leave present bandages in place until follow-up visit in two days.  Keep bandages clean and dry.  Continue present antibiotic.  Return earlier for increased pain, swelling, fever, etc.    Kandra Nicolas, MD 02/20/15 1255

## 2015-02-18 NOTE — Discharge Instructions (Signed)
Leave present bandages in place until follow-up visit in two days.  Keep bandages clean and dry.  Continue present antibiotic.  Return earlier for increased pain, swelling, fever, etc.

## 2015-02-20 ENCOUNTER — Emergency Department (INDEPENDENT_AMBULATORY_CARE_PROVIDER_SITE_OTHER)
Admission: EM | Admit: 2015-02-20 | Discharge: 2015-02-20 | Disposition: A | Discharge status: Home / Self Care | Payer: Medicare Other | Source: Home / Self Care | Attending: Family Medicine | Admitting: Family Medicine

## 2015-02-20 ENCOUNTER — Encounter: Payer: Self-pay | Admitting: *Deleted

## 2015-02-20 DIAGNOSIS — Z4800 Encounter for change or removal of nonsurgical wound dressing: Secondary | ICD-10-CM

## 2015-02-20 LAB — WOUND CULTURE
GRAM STAIN: NONE SEEN
Gram Stain: NONE SEEN
Organism ID, Bacteria: NO GROWTH
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
## 2015-02-20 NOTE — ED Provider Notes (Signed)
CSN: [id number]     Arrival date & time 02/20/15  0805 History   First MD Initiated Contact with Patient 02/20/15 [phone]     Chief Complaint  Patient presents with  . Wound Check      HPI Comments: Patient returns for dressing change with no complaints.  The history is provided by the patient.    Past Medical History  Diagnosis Date  . CAD (coronary artery disease)     s/p CABG 1993 (L-LAD, S-RI, S-D1);   echo 1/10: EF 55%;    myoview 12/09: inf MI, no ischemia  . Hyperlipidemia   . HTN (hypertension)   . Folliculitis   . Obesity   . Osteoarthritis   . Irritable bladder   . Family history of malignant neoplasm of gastrointestinal tract   . Myocardial infarction   . Candida esophagitis 2013    EGD   . Antral gastritis 2013    EGD   . Hiatal hernia   . Atrial fibrillation   . Anemia    Past Surgical History  Procedure Laterality Date  . Coronary artery bypass graft      graft '92: LIMA-LAD, SVG - D2,R1, D1  . Cholecystectomy      laparoscopic '92  . Mole removal  2001 and 2008  . Pilonidal cyst excision    . Enteroscopy  08/22/2012    Procedure: ENTEROSCOPY;  Surgeon: Juanita Craver, MD;  Location: [facility];  Service: Endoscopy;  Laterality: N/A;  . Knee surgery     Family History  Problem Relation Age of Onset  . Coronary artery disease Father     and brother  . Heart attack Father     and brother-fatal  . Stroke Father   . Breast cancer Mother   . Alzheimer's disease Mother   . Colon cancer Maternal Uncle   . Esophageal cancer Neg Hx   . Stomach cancer Neg Hx   . Rectal cancer Neg Hx   . Prostate cancer Neg Hx    History  Substance Use Topics  . Smoking status: Former Smoker -- 16 years    Quit date: 08/14/1964  . Smokeless tobacco: Never Used     Comment: quit 1965  . Alcohol Use: No    Review of Systems No fevers, chills, and sweats.  Minimal pain left arm/hand Allergies  Review of patient's allergies indicates no known allergies.  Home  Medications   Prior to Admission medications   Medication Sig Start Date End Date Taking? Authorizing Provider  amLODipine (NORVASC) 2.5 MG tablet Take 1 tablet (2.5 mg total) by mouth daily. 08/02/14   Lelon Perla, MD  atorvastatin (LIPITOR) 20 MG tablet Take one-half tablet by  mouth daily 07/27/14   Colon Branch, MD  benazepril (LOTENSIN) 40 MG tablet Take 1 tablet (40 mg total) by mouth daily. 01/17/15   Colon Branch, MD  Cholecalciferol (VITAMIN D) 1000 UNITS capsule Take 1,000 Units by mouth daily.      Historical Provider, MD  cyclobenzaprine (FLEXERIL) 5 MG tablet Take 1 at bedtime as needed for muscle relaxant. Caution: May cause drowsiness. Patient not taking: Reported on 11/16/2014 06/16/14   Jacqulyn Cane, MD  hydrocortisone (ANUSOL-HC) 25 MG suppository Place 1 suppository (25 mg total) rectally 2 (two) times daily. Patient not taking: Reported on 11/16/2014 06/01/14   Brunetta Jeans, PA-C  metoprolol succinate (TOPROL-XL) 25 MG 24 hr tablet Take 1 tablet (25 mg total) by mouth daily. 08/02/14   Aaron Edelman  Jacalyn Lefevre, MD  Multiple Vitamin (MULTIVITAMIN) capsule Take 1 capsule by mouth daily.      Historical Provider, MD  nitroGLYCERIN (NITROSTAT) 0.4 MG SL tablet Place 1 tablet (0.4 mg total) under the tongue every 5 (five) minutes as needed for chest pain. 01/04/15   Lelon Perla, MD  pantoprazole (PROTONIX) 40 MG tablet Take 1 tablet (40 mg total) by mouth daily. 08/02/14   Lelon Perla, MD  Probiotic Product (PROBIOTIC DAILY PO) Take 1 tablet by mouth daily.    Historical Provider, MD  sulfacetamide (BLEPH-10) 10 % ophthalmic solution Place 1-2 drops into the right eye every 3 (three) hours while awake. 03/25/14   Kandra Nicolas, MD  sulfamethoxazole-trimethoprim (BACTRIM DS,SEPTRA DS) 800-160 MG per tablet Take 1 tablet by mouth 2 (two) times daily. 02/17/15 02/24/15  Fransico Meadow, PA-C  vitamin B-12 (CYANOCOBALAMIN) 1000 MCG tablet Take 1,000 mcg by mouth daily.    Historical Provider,  MD  XARELTO 20 MG TABS tablet Take 1 tablet by mouth  daily with supper 11/07/14   Lelon Perla, MD   BP 134/75 mmHg  Pulse 74  Temp(Src) 97.7 F (36.5 C) (Oral)  Resp 16  Ht 5\' 11"  (1.803 m)  Wt 218 lb (98.884 kg)  BMI 30.42 kg/m2  SpO2 99% Physical Exam Patient appears comfortable and alert. After removal of bandages, wounds on left forearm and dorsum of left hand appear to be healing well.  Erythema slightly decreased, and minimal tenderness to palpation. ED Course  Procedures  none     MDM   1. Dressing change or removal, nonsurgical wound    Wounds lavaged with normal saline.  Applied Silvadene cream followed by application of Mepelex Border dressings and Coban wrap. Leave present bandages in place until follow-up visit in two days.  Keep bandages clean and dry.  Continue present antibiotic.  Return earlier for increased pain, swelling, drainage, fever, etc.    Kandra Nicolas, MD 02/20/15 1306

## 2015-02-20 NOTE — Discharge Instructions (Signed)
Leave present bandages in place until follow-up visit in two days.  Keep bandages clean and dry.  Continue present antibiotic.  Return earlier for increased pain, swelling, drainage, fever, etc.

## 2015-02-21 DIAGNOSIS — M47816 Spondylosis without myelopathy or radiculopathy, lumbar region: Secondary | ICD-10-CM | POA: Diagnosis not present

## 2015-02-22 ENCOUNTER — Emergency Department (INDEPENDENT_AMBULATORY_CARE_PROVIDER_SITE_OTHER)
Admission: EM | Admit: 2015-02-22 | Discharge: 2015-02-22 | Disposition: A | Discharge status: Home / Self Care | Payer: Medicare Other | Source: Home / Self Care

## 2015-02-22 ENCOUNTER — Encounter: Payer: Self-pay | Admitting: *Deleted

## 2015-02-22 DIAGNOSIS — Z4800 Encounter for change or removal of nonsurgical wound dressing: Secondary | ICD-10-CM

## 2015-02-22 NOTE — ED Notes (Signed)
[name] is here today for a recheck of his LT forearm wound and dressing change.

## 2015-02-22 NOTE — Discharge Instructions (Signed)
Leave present bandage in place until follow-up visit.  Keep bandage clean and dry.

## 2015-02-22 NOTE — ED Provider Notes (Signed)
CSN: [id number]     Arrival date & time 02/22/15  0913 History   None    Chief Complaint  Patient presents with  . Dressing Change      HPI Comments: Patient returns for dressing change to wounds on left hand and arm.  He reports decreased pain.  The history is provided by the patient.    Past Medical History  Diagnosis Date  . CAD (coronary artery disease)     s/p CABG 1993 (L-LAD, S-RI, S-D1);   echo 1/10: EF 55%;    myoview 12/09: inf MI, no ischemia  . Hyperlipidemia   . HTN (hypertension)   . Folliculitis   . Obesity   . Osteoarthritis   . Irritable bladder   . Family history of malignant neoplasm of gastrointestinal tract   . Myocardial infarction   . Candida esophagitis 2013    EGD   . Antral gastritis 2013    EGD   . Hiatal hernia   . Atrial fibrillation   . Anemia    Past Surgical History  Procedure Laterality Date  . Coronary artery bypass graft      graft '92: LIMA-LAD, SVG - D2,R1, D1  . Cholecystectomy      laparoscopic '92  . Mole removal  2001 and 2008  . Pilonidal cyst excision    . Enteroscopy  08/22/2012    Procedure: ENTEROSCOPY;  Surgeon: Juanita Craver, MD;  Location: [facility];  Service: Endoscopy;  Laterality: N/A;  . Knee surgery     Family History  Problem Relation Age of Onset  . Coronary artery disease Father     and brother  . Heart attack Father     and brother-fatal  . Stroke Father   . Breast cancer Mother   . Alzheimer's disease Mother   . Colon cancer Maternal Uncle   . Esophageal cancer Neg Hx   . Stomach cancer Neg Hx   . Rectal cancer Neg Hx   . Prostate cancer Neg Hx    History  Substance Use Topics  . Smoking status: Former Smoker -- 16 years    Quit date: 08/14/1964  . Smokeless tobacco: Never Used     Comment: quit 1965  . Alcohol Use: No    Review of Systems Patient denies fevers, chills, and sweats Allergies  Review of patient's allergies indicates no known allergies.  Home Medications   Prior to  Admission medications   Medication Sig Start Date End Date Taking? Authorizing Provider  amLODipine (NORVASC) 2.5 MG tablet Take 1 tablet (2.5 mg total) by mouth daily. 08/02/14   Lelon Perla, MD  atorvastatin (LIPITOR) 20 MG tablet Take one-half tablet by  mouth daily 07/27/14   Colon Branch, MD  benazepril (LOTENSIN) 40 MG tablet Take 1 tablet (40 mg total) by mouth daily. 01/17/15   Colon Branch, MD  Cholecalciferol (VITAMIN D) 1000 UNITS capsule Take 1,000 Units by mouth daily.      Historical Provider, MD  cyclobenzaprine (FLEXERIL) 5 MG tablet Take 1 at bedtime as needed for muscle relaxant. Caution: May cause drowsiness. Patient not taking: Reported on 11/16/2014 06/16/14   Jacqulyn Cane, MD  hydrocortisone (ANUSOL-HC) 25 MG suppository Place 1 suppository (25 mg total) rectally 2 (two) times daily. Patient not taking: Reported on 11/16/2014 06/01/14   Brunetta Jeans, PA-C  metoprolol succinate (TOPROL-XL) 25 MG 24 hr tablet Take 1 tablet (25 mg total) by mouth daily. 08/02/14   Lelon Perla, MD  Multiple Vitamin (MULTIVITAMIN) capsule Take 1 capsule by mouth daily.      Historical Provider, MD  nitroGLYCERIN (NITROSTAT) 0.4 MG SL tablet Place 1 tablet (0.4 mg total) under the tongue every 5 (five) minutes as needed for chest pain. 01/04/15   Lelon Perla, MD  pantoprazole (PROTONIX) 40 MG tablet Take 1 tablet (40 mg total) by mouth daily. 08/02/14   Lelon Perla, MD  Probiotic Product (PROBIOTIC DAILY PO) Take 1 tablet by mouth daily.    Historical Provider, MD  sulfacetamide (BLEPH-10) 10 % ophthalmic solution Place 1-2 drops into the right eye every 3 (three) hours while awake. 03/25/14   Kandra Nicolas, MD  sulfamethoxazole-trimethoprim (BACTRIM DS,SEPTRA DS) 800-160 MG per tablet Take 1 tablet by mouth 2 (two) times daily. 02/17/15 02/24/15  Fransico Meadow, PA-C  vitamin B-12 (CYANOCOBALAMIN) 1000 MCG tablet Take 1,000 mcg by mouth daily.    Historical Provider, MD  XARELTO 20 MG TABS  tablet Take 1 tablet by mouth  daily with supper 11/07/14   Lelon Perla, MD   Temp(Src) 97.9 F (36.6 C) (Oral) Physical Exam Nursing notes and Vital Signs reviewed. Abrasion left forearm has good granulation tissue, and decreased surrounding erythema and tenderness to palpation. No purulent discharge.  Hematoma still present but decreased somewhat in size.  Abrasion dorsum left hand has good granulation tissue, and decreased surrounding erythema and tenderness to palpation.  No purulent discharge.  ED Course  Procedures  none   MDM   1. Change or removal of nonsurgical wound dressing    Wounds cleaned with saline.  Applied thin film of Silvadene cream.  Applied Mepelex Border 15cm by 15cm to wound on left forearm.  Applied Mepelex Border 10cm by 10cm dressing to wound on dorsum of left hand.  Secured bandages with CoBan wrap. Return in two days for dressing change.    Kandra Nicolas, MD 02/22/15 [phone]

## 2015-02-24 ENCOUNTER — Emergency Department (INDEPENDENT_AMBULATORY_CARE_PROVIDER_SITE_OTHER)
Admission: EM | Admit: 2015-02-24 | Discharge: 2015-02-24 | Disposition: A | Discharge status: Home / Self Care | Payer: Medicare Other | Source: Home / Self Care | Attending: Emergency Medicine | Admitting: Emergency Medicine

## 2015-02-24 DIAGNOSIS — S40022D Contusion of left upper arm, subsequent encounter: Secondary | ICD-10-CM | POA: Diagnosis not present

## 2015-02-24 DIAGNOSIS — S40812D Abrasion of left upper arm, subsequent encounter: Secondary | ICD-10-CM | POA: Diagnosis not present
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
## 2015-02-24 NOTE — ED Notes (Signed)
Bandage change left forearm and hand, healing well, no problems

## 2015-02-27 ENCOUNTER — Encounter: Payer: Self-pay | Admitting: *Deleted

## 2015-02-27 ENCOUNTER — Emergency Department (INDEPENDENT_AMBULATORY_CARE_PROVIDER_SITE_OTHER)
Admission: EM | Admit: 2015-02-27 | Discharge: 2015-02-27 | Disposition: A | Discharge status: Home / Self Care | Payer: Medicare Other | Source: Home / Self Care | Attending: Family Medicine | Admitting: Family Medicine

## 2015-02-27 DIAGNOSIS — Z4800 Encounter for change or removal of nonsurgical wound dressing: Secondary | ICD-10-CM
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
## 2015-02-27 NOTE — Discharge Instructions (Signed)
Leave present dressings in place for two days.  Keep clean and dry.

## 2015-02-27 NOTE — ED Provider Notes (Signed)
CSN: [id number]     Arrival date & time 02/27/15  0806 History   First MD Initiated Contact with Patient 02/27/15 0825     Chief Complaint  Patient presents with  . Wound Check      HPI Comments: Patient returns for dressing change of bandages left forearm and dorsum left hand.  He has had no pain or drainage from wounds.  The history is provided by the patient.    Past Medical History  Diagnosis Date  . CAD (coronary artery disease)     s/p CABG 1993 (L-LAD, S-RI, S-D1);   echo 1/10: EF 55%;    myoview 12/09: inf MI, no ischemia  . Hyperlipidemia   . HTN (hypertension)   . Folliculitis   . Obesity   . Osteoarthritis   . Irritable bladder   . Family history of malignant neoplasm of gastrointestinal tract   . Myocardial infarction   . Candida esophagitis 2013    EGD   . Antral gastritis 2013    EGD   . Hiatal hernia   . Atrial fibrillation   . Anemia    Past Surgical History  Procedure Laterality Date  . Coronary artery bypass graft      graft '92: LIMA-LAD, SVG - D2,R1, D1  . Cholecystectomy      laparoscopic '92  . Mole removal  2001 and 2008  . Pilonidal cyst excision    . Enteroscopy  08/22/2012    Procedure: ENTEROSCOPY;  Surgeon: Juanita Craver, MD;  Location: [facility];  Service: Endoscopy;  Laterality: N/A;  . Knee surgery     Family History  Problem Relation Age of Onset  . Coronary artery disease Father     and brother  . Heart attack Father     and brother-fatal  . Stroke Father   . Breast cancer Mother   . Alzheimer's disease Mother   . Colon cancer Maternal Uncle   . Esophageal cancer Neg Hx   . Stomach cancer Neg Hx   . Rectal cancer Neg Hx   . Prostate cancer Neg Hx    History  Substance Use Topics  . Smoking status: Former Smoker -- 16 years    Quit date: 08/14/1964  . Smokeless tobacco: Never Used     Comment: quit 1965  . Alcohol Use: No    Review of Systems No fevers, chills, and sweats  Allergies  Review of patient's allergies  indicates no known allergies.  Home Medications   Prior to Admission medications   Medication Sig Start Date End Date Taking? Authorizing Provider  amLODipine (NORVASC) 2.5 MG tablet Take 1 tablet (2.5 mg total) by mouth daily. 08/02/14   Lelon Perla, MD  atorvastatin (LIPITOR) 20 MG tablet Take one-half tablet by  mouth daily 07/27/14   Colon Branch, MD  benazepril (LOTENSIN) 40 MG tablet Take 1 tablet (40 mg total) by mouth daily. 01/17/15   Colon Branch, MD  Cholecalciferol (VITAMIN D) 1000 UNITS capsule Take 1,000 Units by mouth daily.      Historical Provider, MD  cyclobenzaprine (FLEXERIL) 5 MG tablet Take 1 at bedtime as needed for muscle relaxant. Caution: May cause drowsiness. Patient not taking: Reported on 11/16/2014 06/16/14   Jacqulyn Cane, MD  hydrocortisone (ANUSOL-HC) 25 MG suppository Place 1 suppository (25 mg total) rectally 2 (two) times daily. Patient not taking: Reported on 11/16/2014 06/01/14   Brunetta Jeans, PA-C  metoprolol succinate (TOPROL-XL) 25 MG 24 hr tablet Take 1  tablet (25 mg total) by mouth daily. 08/02/14   Lelon Perla, MD  Multiple Vitamin (MULTIVITAMIN) capsule Take 1 capsule by mouth daily.      Historical Provider, MD  nitroGLYCERIN (NITROSTAT) 0.4 MG SL tablet Place 1 tablet (0.4 mg total) under the tongue every 5 (five) minutes as needed for chest pain. 01/04/15   Lelon Perla, MD  pantoprazole (PROTONIX) 40 MG tablet Take 1 tablet (40 mg total) by mouth daily. 08/02/14   Lelon Perla, MD  Probiotic Product (PROBIOTIC DAILY PO) Take 1 tablet by mouth daily.    Historical Provider, MD  sulfacetamide (BLEPH-10) 10 % ophthalmic solution Place 1-2 drops into the right eye every 3 (three) hours while awake. 03/25/14   Kandra Nicolas, MD  vitamin B-12 (CYANOCOBALAMIN) 1000 MCG tablet Take 1,000 mcg by mouth daily.    Historical Provider, MD  XARELTO 20 MG TABS tablet Take 1 tablet by mouth  daily with supper 11/07/14   Lelon Perla, MD   Temp(Src)  97.7 F (36.5 C) (Oral) Physical Exam Nursing notes and Vital Signs reviewed. Appearance:  Patient appears stated age, and in no acute distress Bandages from wounds left forearm and dorsum left hand removed; excellent granulation tissue present without evidence cellulitis and minimal tenderness to palpation.  No purulent drainage.  The hematoma on left forearm has decreased slightly in size.   ED Course  Procedures:  Dressing Changes   Wound on left forearm and dorsum left hand debrided light to remove devitalized skin, and cleansed with sterile saline.  Applied Silvadene cream followed by Mepelex Border 7.5cm by 7.5cm dressing on each wound.  Secured bandages with CoBan wrap.   MDM   1. Dressing change or removal, nonsurgical wound.  Skin abrasions left arm and hand healing well without evidence cellulitis.     Leave present dressings in place for two days.  Keep clean and dry. Return in two days.   Kandra Nicolas, MD 02/27/15 1247

## 2015-03-01 ENCOUNTER — Emergency Department (INDEPENDENT_AMBULATORY_CARE_PROVIDER_SITE_OTHER)
Admission: EM | Admit: 2015-03-01 | Discharge: 2015-03-01 | Disposition: A | Discharge status: Home / Self Care | Payer: Medicare Other | Source: Home / Self Care | Attending: Family Medicine | Admitting: Family Medicine

## 2015-03-01 DIAGNOSIS — Z4800 Encounter for change or removal of nonsurgical wound dressing: Secondary | ICD-10-CM | POA: Diagnosis not present

## 2015-03-01 NOTE — Discharge Instructions (Signed)
Leave present dressing in place for four days, then remove.  At that time, if wounds not quite healed, may apply Bacitracin ointment and new Mepelex Border dressings and leave in place 3 to 4 days.

## 2015-03-01 NOTE — ED Notes (Signed)
[name] is here for wound check to LFA.

## 2015-03-01 NOTE — ED Provider Notes (Signed)
CSN: [id number]     Arrival date & time 03/01/15  1320 History   First MD Initiated Contact with Patient 03/01/15 1358     Chief Complaint  Patient presents with  . Wound Check     HPI Comments: Patient returns for dressing changes without complaints.  The history is provided by the patient.    Past Medical History  Diagnosis Date  . CAD (coronary artery disease)     s/p CABG 1993 (L-LAD, S-RI, S-D1);   echo 1/10: EF 55%;    myoview 12/09: inf MI, no ischemia  . Hyperlipidemia   . HTN (hypertension)   . Folliculitis   . Obesity   . Osteoarthritis   . Irritable bladder   . Family history of malignant neoplasm of gastrointestinal tract   . Myocardial infarction   . Candida esophagitis 2013    EGD   . Antral gastritis 2013    EGD   . Hiatal hernia   . Atrial fibrillation   . Anemia    Past Surgical History  Procedure Laterality Date  . Coronary artery bypass graft      graft '92: LIMA-LAD, SVG - D2,R1, D1  . Cholecystectomy      laparoscopic '92  . Mole removal  2001 and 2008  . Pilonidal cyst excision    . Enteroscopy  08/22/2012    Procedure: ENTEROSCOPY;  Surgeon: Juanita Craver, MD;  Location: [facility];  Service: Endoscopy;  Laterality: N/A;  . Knee surgery     Family History  Problem Relation Age of Onset  . Coronary artery disease Father     and brother  . Heart attack Father     and brother-fatal  . Stroke Father   . Breast cancer Mother   . Alzheimer's disease Mother   . Colon cancer Maternal Uncle   . Esophageal cancer Neg Hx   . Stomach cancer Neg Hx   . Rectal cancer Neg Hx   . Prostate cancer Neg Hx    History  Substance Use Topics  . Smoking status: Former Smoker -- 16 years    Quit date: 08/14/1964  . Smokeless tobacco: Never Used     Comment: quit 1965  . Alcohol Use: No    Review of Systems No fevers, chills, and sweats.  No drainage from wounds left hand and arm. Allergies  Review of patient's allergies indicates no known  allergies.  Home Medications   Prior to Admission medications   Medication Sig Start Date End Date Taking? Authorizing Provider  amLODipine (NORVASC) 2.5 MG tablet Take 1 tablet (2.5 mg total) by mouth daily. 08/02/14   Lelon Perla, MD  atorvastatin (LIPITOR) 20 MG tablet Take one-half tablet by  mouth daily 07/27/14   Colon Branch, MD  benazepril (LOTENSIN) 40 MG tablet Take 1 tablet (40 mg total) by mouth daily. 01/17/15   Colon Branch, MD  Cholecalciferol (VITAMIN D) 1000 UNITS capsule Take 1,000 Units by mouth daily.      Historical Provider, MD  cyclobenzaprine (FLEXERIL) 5 MG tablet Take 1 at bedtime as needed for muscle relaxant. Caution: May cause drowsiness. Patient not taking: Reported on 11/16/2014 06/16/14   Jacqulyn Cane, MD  hydrocortisone (ANUSOL-HC) 25 MG suppository Place 1 suppository (25 mg total) rectally 2 (two) times daily. Patient not taking: Reported on 11/16/2014 06/01/14   Brunetta Jeans, PA-C  metoprolol succinate (TOPROL-XL) 25 MG 24 hr tablet Take 1 tablet (25 mg total) by mouth daily. 08/02/14  Lelon Perla, MD  Multiple Vitamin (MULTIVITAMIN) capsule Take 1 capsule by mouth daily.      Historical Provider, MD  nitroGLYCERIN (NITROSTAT) 0.4 MG SL tablet Place 1 tablet (0.4 mg total) under the tongue every 5 (five) minutes as needed for chest pain. 01/04/15   Lelon Perla, MD  pantoprazole (PROTONIX) 40 MG tablet Take 1 tablet (40 mg total) by mouth daily. 08/02/14   Lelon Perla, MD  Probiotic Product (PROBIOTIC DAILY PO) Take 1 tablet by mouth daily.    Historical Provider, MD  sulfacetamide (BLEPH-10) 10 % ophthalmic solution Place 1-2 drops into the right eye every 3 (three) hours while awake. 03/25/14   Kandra Nicolas, MD  vitamin B-12 (CYANOCOBALAMIN) 1000 MCG tablet Take 1,000 mcg by mouth daily.    Historical Provider, MD  XARELTO 20 MG TABS tablet Take 1 tablet by mouth  daily with supper 11/07/14   Lelon Perla, MD   BP 115/65 mmHg  Pulse 96   Temp(Src) 98.2 F (36.8 C) (Oral)  Resp 12 Physical Exam Left hand dorsum and forearm:  Excellent healing; still some granulation tissue.  No evidence infection.  Minimal tenderness to palpation. ED Course  Procedures  none  MDM   1. Dressing change or removal, nonsurgical wound    Cleansed wounds with saline.  Applied Silvadene cream and Mepelex Border dressing, and secured with CoBan wrap. Leave present dressing in place for four days, then remove.  At that time, if wounds not quite healed, may apply Bacitracin ointment and new Mepelex Border dressings and leave in place 3 to 4 days. Return for follow-up if necessary    Kandra Nicolas, MD 03/01/15 1950

## 2015-03-07 DIAGNOSIS — S8331XD Tear of articular cartilage of right knee, current, subsequent encounter: Secondary | ICD-10-CM | POA: Diagnosis not present

## 2015-03-15 ENCOUNTER — Other Ambulatory Visit: Payer: Self-pay

## 2015-03-15 DIAGNOSIS — Z87891 Personal history of nicotine dependence: Secondary | ICD-10-CM | POA: Diagnosis not present

## 2015-03-15 DIAGNOSIS — Z79899 Other long term (current) drug therapy: Secondary | ICD-10-CM | POA: Diagnosis not present

## 2015-03-15 DIAGNOSIS — I2510 Atherosclerotic heart disease of native coronary artery without angina pectoris: Secondary | ICD-10-CM | POA: Diagnosis not present

## 2015-03-15 DIAGNOSIS — Z951 Presence of aortocoronary bypass graft: Secondary | ICD-10-CM | POA: Diagnosis not present

## 2015-03-15 DIAGNOSIS — I252 Old myocardial infarction: Secondary | ICD-10-CM | POA: Diagnosis not present

## 2015-03-15 DIAGNOSIS — M79661 Pain in right lower leg: Secondary | ICD-10-CM | POA: Diagnosis not present

## 2015-03-15 DIAGNOSIS — I4891 Unspecified atrial fibrillation: Secondary | ICD-10-CM | POA: Diagnosis not present

## 2015-03-15 DIAGNOSIS — I10 Essential (primary) hypertension: Secondary | ICD-10-CM | POA: Diagnosis not present

## 2015-03-15 DIAGNOSIS — M79604 Pain in right leg: Secondary | ICD-10-CM | POA: Diagnosis not present

## 2015-03-15 DIAGNOSIS — M7121 Synovial cyst of popliteal space [Baker], right knee: Secondary | ICD-10-CM | POA: Diagnosis not present

## 2015-03-15 DIAGNOSIS — M79609 Pain in unspecified limb: Secondary | ICD-10-CM | POA: Diagnosis not present

## 2015-03-15 DIAGNOSIS — K219 Gastro-esophageal reflux disease without esophagitis: Secondary | ICD-10-CM | POA: Diagnosis not present

## 2015-03-15 DIAGNOSIS — Z7901 Long term (current) use of anticoagulants: Secondary | ICD-10-CM | POA: Diagnosis not present

## 2015-03-20 ENCOUNTER — Other Ambulatory Visit: Payer: Self-pay

## 2015-03-22 DIAGNOSIS — S8331XD Tear of articular cartilage of right knee, current, subsequent encounter: Secondary | ICD-10-CM | POA: Diagnosis not present

## 2015-03-22 DIAGNOSIS — M1711 Unilateral primary osteoarthritis, right knee: Secondary | ICD-10-CM | POA: Diagnosis not present

## 2015-03-23 ENCOUNTER — Telehealth: Payer: Self-pay | Admitting: Internal Medicine

## 2015-03-23 NOTE — Telephone Encounter (Signed)
Pre Visit letter sent  °

## 2015-03-27 ENCOUNTER — Other Ambulatory Visit: Payer: Self-pay

## 2015-03-30 DIAGNOSIS — L821 Other seborrheic keratosis: Secondary | ICD-10-CM | POA: Diagnosis not present

## 2015-03-30 DIAGNOSIS — L570 Actinic keratosis: Secondary | ICD-10-CM | POA: Diagnosis not present

## 2015-04-03 ENCOUNTER — Telehealth: Payer: Self-pay | Admitting: Cardiology

## 2015-04-03 MED ORDER — RIVAROXABAN 20 MG PO TABS: ORAL_TABLET | ORAL | Status: DC

## 2015-04-03 NOTE — Telephone Encounter (Signed)
Patient aware samples are at the front desk for pick up  

## 2015-04-03 NOTE — Telephone Encounter (Signed)
Pt called in wanting to know if he could have some samples of Xarelto. Please call  Thanks

## 2015-04-06 DIAGNOSIS — M25561 Pain in right knee: Secondary | ICD-10-CM | POA: Diagnosis not present

## 2015-04-06 DIAGNOSIS — M1711 Unilateral primary osteoarthritis, right knee: Secondary | ICD-10-CM | POA: Diagnosis not present

## 2015-04-07 ENCOUNTER — Telehealth: Payer: Self-pay | Admitting: *Deleted

## 2015-04-07 NOTE — Telephone Encounter (Signed)
Unable to reach patient at time of Pre-Visit Call.  Left message for patient to return call when available.    

## 2015-04-10 ENCOUNTER — Ambulatory Visit (INDEPENDENT_AMBULATORY_CARE_PROVIDER_SITE_OTHER): Payer: Medicare Other | Admitting: Internal Medicine

## 2015-04-10 ENCOUNTER — Other Ambulatory Visit: Payer: Self-pay

## 2015-04-10 ENCOUNTER — Encounter: Payer: Self-pay | Admitting: Internal Medicine

## 2015-04-10 VITALS — BP 126/78 | HR 67 | Temp 97.8°F | Ht 71.0 in | Wt 223.4 lb

## 2015-04-10 DIAGNOSIS — Z0000 Encounter for general adult medical examination without abnormal findings: Secondary | ICD-10-CM

## 2015-04-10 DIAGNOSIS — E119 Type 2 diabetes mellitus without complications: Secondary | ICD-10-CM | POA: Diagnosis not present

## 2015-04-10 DIAGNOSIS — I482 Chronic atrial fibrillation, unspecified: Secondary | ICD-10-CM

## 2015-04-10 DIAGNOSIS — R399 Unspecified symptoms and signs involving the genitourinary system: Secondary | ICD-10-CM | POA: Diagnosis not present

## 2015-04-10 DIAGNOSIS — I10 Essential (primary) hypertension: Secondary | ICD-10-CM | POA: Diagnosis not present

## 2015-04-10 DIAGNOSIS — E785 Hyperlipidemia, unspecified: Secondary | ICD-10-CM

## 2015-04-10 DIAGNOSIS — I872 Venous insufficiency (chronic) (peripheral): Secondary | ICD-10-CM

## 2015-04-10 LAB — CBC WITH DIFFERENTIAL/PLATELET
BASOS ABS: 0 10*3/uL (ref 0.0–0.1)
BASOS PCT: 0.4 % (ref 0.0–3.0)
Eosinophils Absolute: 0.1 10*3/uL (ref 0.0–0.7)
Eosinophils Relative: 2.1 % (ref 0.0–5.0)
HEMATOCRIT: 42.5 % (ref 39.0–52.0)
Hemoglobin: 14.4 g/dL (ref 13.0–17.0)
LYMPHS ABS: 1.5 10*3/uL (ref 0.7–4.0)
Lymphocytes Relative: 26 % (ref 12.0–46.0)
MCHC: 34 g/dL (ref 30.0–36.0)
MCV: 95.6 fl (ref 78.0–100.0)
MONO ABS: 0.5 10*3/uL (ref 0.1–1.0)
Monocytes Relative: 9.6 % (ref 3.0–12.0)
Neutro Abs: 3.5 10*3/uL (ref 1.4–7.7)
Neutrophils Relative %: 61.9 % (ref 43.0–77.0)
Platelets: 189 10*3/uL (ref 150.0–400.0)
RBC: 4.44 Mil/uL (ref 4.22–5.81)
RDW: 14 % (ref 11.5–15.5)
WBC: 5.6 10*3/uL (ref 4.0–10.5)

## 2015-04-10 LAB — LIPID PANEL
Cholesterol: 145 mg/dL (ref 0–200)
HDL: 31.6 mg/dL — AB (ref >39.00)
LDL Cholesterol: 77 mg/dL (ref 0–99)
NonHDL: 113.4
Total CHOL/HDL Ratio: 5
Triglycerides: 183 mg/dL — ABNORMAL HIGH (ref 0.0–149.0)
VLDL: 36.6 mg/dL (ref 0.0–40.0)

## 2015-04-10 LAB — COMPREHENSIVE METABOLIC PANEL
ALT: 20 U/L (ref 0–53)
AST: 21 U/L (ref 0–37)
Albumin: 4 g/dL (ref 3.5–5.2)
Alkaline Phosphatase: 56 U/L (ref 39–117)
BILIRUBIN TOTAL: 0.6 mg/dL (ref 0.2–1.2)
BUN: 16 mg/dL (ref 6–23)
CHLORIDE: 106 meq/L (ref 96–112)
CO2: 29 meq/L (ref 19–32)
CREATININE: 1.4 mg/dL (ref 0.40–1.50)
Calcium: 9.5 mg/dL (ref 8.4–10.5)
GFR: 51.64 mL/min — ABNORMAL LOW (ref >60.00)
Glucose, Bld: 122 mg/dL — ABNORMAL HIGH (ref 70–99)
Potassium: 4.3 mEq/L (ref 3.5–5.1)
SODIUM: 140 meq/L (ref 135–145)
Total Protein: 7.2 g/dL (ref 6.0–8.3)

## 2015-04-10 LAB — HEMOGLOBIN A1C: Hgb A1c MFr Bld: 6.4 % (ref 4.6–6.5)

## 2015-04-10 MED ORDER — BENAZEPRIL HCL 40 MG PO TABS: 40.0000 mg | ORAL_TABLET | Freq: Every day | ORAL | Status: DC

## 2015-04-10 MED ORDER — PANTOPRAZOLE SODIUM 40 MG PO TBEC: 40.0000 mg | DELAYED_RELEASE_TABLET | Freq: Every day | ORAL | Status: DC

## 2015-04-10 MED ORDER — AMLODIPINE BESYLATE 2.5 MG PO TABS: 2.5000 mg | ORAL_TABLET | Freq: Every day | ORAL | Status: DC

## 2015-04-10 MED ORDER — ATORVASTATIN CALCIUM 20 MG PO TABS: 10.0000 mg | ORAL_TABLET | Freq: Every day | ORAL | Status: DC

## 2015-04-10 MED ORDER — METOPROLOL SUCCINATE ER 25 MG PO TB24: 25.0000 mg | ORAL_TABLET | Freq: Every day | ORAL | Status: DC

## 2015-04-10 NOTE — Assessment & Plan Note (Signed)
History of dyspepsia , hiatal hernia, antral gastritis and Candida esophagitis EGD in 2013. Currently asymptomatic. Recommend to decrease PPIs to every of the and consider discharge on return to the office.

## 2015-04-10 NOTE — Assessment & Plan Note (Signed)
Continue with atorvastatin, check labs. Refill medications

## 2015-04-10 NOTE — Assessment & Plan Note (Signed)
Occasional urinary urgency, DRE  2015 wnl, no recent PSA Plan--UA and urine culture

## 2015-04-10 NOTE — Assessment & Plan Note (Signed)
Due for a A1c, on no medications.

## 2015-04-10 NOTE — Assessment & Plan Note (Addendum)
Mild lower extremity edema, worse on the left where he had surgery during CABG, recommend leg elevation

## 2015-04-10 NOTE — Patient Instructions (Addendum)
Get your blood work before you leave   Change Protonix to one tablet every other day  Come back to the office in 6 months   for a routine check up       [place] cause injuries and can affect all age groups. It is possible to use preventive measures to significantly decrease the likelihood of falls. There are many simple measures which can make your home safer and prevent falls. OUTDOORS  Repair cracks and edges of walkways and driveways.  Remove high doorway thresholds.  Trim shrubbery on the main path into your home.  Have good outside lighting.  Clear walkways of tools, rocks, debris, and clutter.  Check that handrails are not broken and are securely fastened. Both sides of steps should have handrails.  Have leaves, snow, and ice cleared regularly.  Use sand or salt on walkways during winter months.  In the garage, clean up grease or oil spills. BATHROOM  Install night lights.  Install grab bars by the toilet and in the tub and shower.  Use non-skid mats or decals in the tub or shower.  Place a plastic non-slip stool in the shower to sit on, if needed.  Keep floors dry and clean up all water on the floor immediately.  Remove soap buildup in the tub or shower on a regular basis.  Secure bath mats with non-slip, double-sided rug tape.  Remove throw rugs and tripping hazards from the floors. BEDROOMS  Install night lights.  Make sure a bedside light is easy to reach.  Do not use oversized bedding.  Keep a telephone by your bedside.  Have a firm chair with side arms to use for getting dressed.  Remove throw rugs and tripping hazards from the floor. KITCHEN  Keep handles on pots and pans turned toward the center of the stove. Use back burners when possible.  Clean up spills quickly and allow time for drying.  Avoid walking on wet floors.  Avoid hot utensils and knives.  Position shelves so they are not too high or  low.  Place commonly used objects within easy reach.  If necessary, use a sturdy step stool with a grab bar when reaching.  Keep electrical cables out of the way.  Do not use floor polish or wax that makes floors slippery. If you must use wax, use non-skid floor wax.  Remove throw rugs and tripping hazards from the floor. STAIRWAYS  Never leave objects on stairs.  Place handrails on both sides of stairways and use them. Fix any loose handrails. Make sure handrails on both sides of the stairways are as long as the stairs.  Check carpeting to make sure it is firmly attached along stairs. Make repairs to worn or loose carpet promptly.  Avoid placing throw rugs at the top or bottom of stairways, or properly secure the rug with carpet tape to prevent slippage. Get rid of throw rugs, if possible.  Have an electrician put in a light switch at the top and bottom of the stairs. OTHER FALL PREVENTION TIPS  Wear low-heel or rubber-soled shoes that are supportive and fit well. Wear closed toe shoes.  When using a stepladder, make sure it is fully opened and both spreaders are firmly locked. Do not climb a closed stepladder.  Add color or contrast paint or tape to grab bars and handrails in your home. Place contrasting color strips on first and last steps.  Learn and use mobility aids as needed.  Install an electrical emergency response system.  Turn on lights to avoid dark areas. Replace light bulbs that burn out immediately. Get light switches that glow.  Arrange furniture to create clear pathways. Keep furniture in the same place.  Firmly attach carpet with non-skid or double-sided tape.  Eliminate uneven floor surfaces.  Select a carpet pattern that does not visually hide the edge of steps.  Be aware of all pets. OTHER HOME SAFETY TIPS  Set the water temperature for 120 F (48.8 C).  Keep emergency numbers on or near the telephone.  Keep smoke detectors on every level of the  home and near sleeping areas. Document Released: 11/15/2002 Document Revised: 05/26/2012 Document Reviewed: 02/14/2012 [facility] Patient Information 2015 [place], [place]. This information is not intended to replace advice given to you by your health care provider. Make sure you discuss any questions you have with your health care provider.   Preventive Care for Adults Ages 56 and over  Blood pressure check.** / Every 1 to 2 years.  Lipid and cholesterol check.**/ Every 5 years beginning at age 64.  Lung cancer screening. / Every year if you are aged 21-80 years and have a 30-pack-year history of smoking and currently smoke or have quit within the past 15 years. Yearly screening is stopped once you have quit smoking for at least 15 years or develop a health problem that would prevent you from having lung cancer treatment.  Fecal occult blood test (FOBT) of stool. / Every year beginning at age 47 and continuing until age 27. You may not have to do this test if you get a colonoscopy every 10 years.  Flexible sigmoidoscopy** or colonoscopy.** / Every 5 years for a flexible sigmoidoscopy or every 10 years for a colonoscopy beginning at age 59 and continuing until age 78.  Hepatitis C blood test.** / For all people born from [birth year] through 1963 and any individual with known risks for hepatitis C.  Abdominal aortic aneurysm (AAA) screening.** / A one-time screening for ages 75 to 59 years who are current or former smokers.  Skin self-exam. / Monthly.  Influenza vaccine. / Every year.  Tetanus, diphtheria, and acellular pertussis (Tdap/Td) vaccine.** / 1 dose of Td every 10 years.  Varicella vaccine.** / Consult your health care provider.  Zoster vaccine.** / 1 dose for adults aged 3 years or older.  Pneumococcal 13-valent conjugate (PCV13) vaccine.** / Consult your health care provider.  Pneumococcal polysaccharide (PPSV23) vaccine.** / 1 dose for all adults aged 85 years and  older.  Meningococcal vaccine.** / Consult your health care provider.  Hepatitis A vaccine.** / Consult your health care provider.  Hepatitis B vaccine.** / Consult your health care provider.  Haemophilus influenzae type b (Hib) vaccine.** / Consult your health care provider. **Family history and personal history of risk and conditions may change your health care provider's recommendations. Document Released: 01/21/2002 Document Revised: 11/30/2013 Document Reviewed: 04/22/2011 [facility] Patient Information 2015 [place], [place]. This information is not intended to replace advice given to you by your health care provider. Make sure you discuss any questions you have with your health care provider.
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
## 2015-04-10 NOTE — Assessment & Plan Note (Addendum)
Td 2013 S/p PNM shot  prevnar 2015 zostavax discussed  Cscope 02-2012 (-), next per GI  Prostate cancer screening, see previous entry, no further screening  Diet and exercise discussed

## 2015-04-10 NOTE — Assessment & Plan Note (Signed)
Well-controlled at present time, check labs, refill medications

## 2015-04-10 NOTE — Assessment & Plan Note (Addendum)
Rate  well-controlled, samples of Xarelto provided

## 2015-04-12 LAB — URINE CULTURE
COLONY COUNT: NO GROWTH
Organism ID, Bacteria: NO GROWTH

## 2015-04-14 ENCOUNTER — Telehealth: Payer: Self-pay | Admitting: Internal Medicine

## 2015-04-14 NOTE — Telephone Encounter (Signed)
Caller name: [place] Relation to pt: self Call back number: [phone] Pharmacy:  Reason for call:   Requesting a callback regarding benazepril rx. He wants to know why Dr. Larose Kells wants him to discontinue taking this medication?

## 2015-04-14 NOTE — Telephone Encounter (Signed)
Informed Pt that he is not supposed to d/c Benazepril, Pt informed me that the pharmacist at his pharmacy informed him that we sent a message to d/c Benazepril, informed him that we sent a message to the pharmacy in regards to d/c the old prescriptions that way he would have the new prescription. Informed Pt that I sent a 90 day supply with 2 refills. Pt verbalized understanding and informed me he would try contacting pharmacy again.

## 2015-05-09 ENCOUNTER — Telehealth: Payer: Self-pay | Admitting: Cardiology

## 2015-05-09 NOTE — Telephone Encounter (Signed)
Patient notified no samples are available at [place] office. Gave patient number to [facility] and [place], RN will check to see if there are samples in [facility]

## 2015-05-09 NOTE — Telephone Encounter (Signed)
Mr.Rozman is calling to see if we have any Xarelto samples and if he can pick those up at the [place] office tomorrow . Please call

## 2015-05-10 ENCOUNTER — Telehealth: Payer: Self-pay | Admitting: *Deleted

## 2015-05-10 NOTE — Telephone Encounter (Signed)
Xarelto samples placed at the front desk for patient. 

## 2015-05-17 DIAGNOSIS — R2689 Other abnormalities of gait and mobility: Secondary | ICD-10-CM | POA: Diagnosis not present

## 2015-05-17 DIAGNOSIS — M6281 Muscle weakness (generalized): Secondary | ICD-10-CM | POA: Diagnosis not present

## 2015-05-17 DIAGNOSIS — M25561 Pain in right knee: Secondary | ICD-10-CM | POA: Diagnosis not present

## 2015-05-17 DIAGNOSIS — M25661 Stiffness of right knee, not elsewhere classified: Secondary | ICD-10-CM | POA: Diagnosis not present

## 2015-05-23 DIAGNOSIS — R2689 Other abnormalities of gait and mobility: Secondary | ICD-10-CM | POA: Diagnosis not present

## 2015-05-23 DIAGNOSIS — M6281 Muscle weakness (generalized): Secondary | ICD-10-CM | POA: Diagnosis not present

## 2015-05-23 DIAGNOSIS — M25661 Stiffness of right knee, not elsewhere classified: Secondary | ICD-10-CM | POA: Diagnosis not present

## 2015-05-23 DIAGNOSIS — M25561 Pain in right knee: Secondary | ICD-10-CM | POA: Diagnosis not present

## 2015-05-25 DIAGNOSIS — M6281 Muscle weakness (generalized): Secondary | ICD-10-CM | POA: Diagnosis not present

## 2015-05-25 DIAGNOSIS — M25561 Pain in right knee: Secondary | ICD-10-CM | POA: Diagnosis not present

## 2015-05-25 DIAGNOSIS — R2689 Other abnormalities of gait and mobility: Secondary | ICD-10-CM | POA: Diagnosis not present

## 2015-05-25 DIAGNOSIS — M25661 Stiffness of right knee, not elsewhere classified: Secondary | ICD-10-CM | POA: Diagnosis not present

## 2015-05-30 DIAGNOSIS — R2689 Other abnormalities of gait and mobility: Secondary | ICD-10-CM | POA: Diagnosis not present

## 2015-05-30 DIAGNOSIS — M25661 Stiffness of right knee, not elsewhere classified: Secondary | ICD-10-CM | POA: Diagnosis not present

## 2015-05-30 DIAGNOSIS — M25561 Pain in right knee: Secondary | ICD-10-CM | POA: Diagnosis not present

## 2015-05-30 DIAGNOSIS — M6281 Muscle weakness (generalized): Secondary | ICD-10-CM | POA: Diagnosis not present

## 2015-06-01 DIAGNOSIS — M25661 Stiffness of right knee, not elsewhere classified: Secondary | ICD-10-CM | POA: Diagnosis not present

## 2015-06-01 DIAGNOSIS — M6281 Muscle weakness (generalized): Secondary | ICD-10-CM | POA: Diagnosis not present

## 2015-06-01 DIAGNOSIS — M25561 Pain in right knee: Secondary | ICD-10-CM | POA: Diagnosis not present

## 2015-06-01 DIAGNOSIS — R2689 Other abnormalities of gait and mobility: Secondary | ICD-10-CM | POA: Diagnosis not present

## 2015-06-05 ENCOUNTER — Telehealth: Payer: Self-pay | Admitting: *Deleted

## 2015-06-05 NOTE — Telephone Encounter (Signed)
Pt signed ROI received via fax from [facility]. Forwarded to [place] to scan/email to medical records. JG//CMA

## 2015-06-06 DIAGNOSIS — M6281 Muscle weakness (generalized): Secondary | ICD-10-CM | POA: Diagnosis not present

## 2015-06-06 DIAGNOSIS — M25561 Pain in right knee: Secondary | ICD-10-CM | POA: Diagnosis not present

## 2015-06-06 DIAGNOSIS — R2689 Other abnormalities of gait and mobility: Secondary | ICD-10-CM | POA: Diagnosis not present

## 2015-06-06 DIAGNOSIS — M25661 Stiffness of right knee, not elsewhere classified: Secondary | ICD-10-CM | POA: Diagnosis not present

## 2015-06-08 DIAGNOSIS — M6281 Muscle weakness (generalized): Secondary | ICD-10-CM | POA: Diagnosis not present

## 2015-06-08 DIAGNOSIS — R2689 Other abnormalities of gait and mobility: Secondary | ICD-10-CM | POA: Diagnosis not present

## 2015-06-08 DIAGNOSIS — M25661 Stiffness of right knee, not elsewhere classified: Secondary | ICD-10-CM | POA: Diagnosis not present

## 2015-06-08 DIAGNOSIS — M25561 Pain in right knee: Secondary | ICD-10-CM | POA: Diagnosis not present

## 2015-06-13 DIAGNOSIS — M25661 Stiffness of right knee, not elsewhere classified: Secondary | ICD-10-CM | POA: Diagnosis not present

## 2015-06-13 DIAGNOSIS — M6281 Muscle weakness (generalized): Secondary | ICD-10-CM | POA: Diagnosis not present

## 2015-06-13 DIAGNOSIS — M25561 Pain in right knee: Secondary | ICD-10-CM | POA: Diagnosis not present

## 2015-06-13 DIAGNOSIS — R2689 Other abnormalities of gait and mobility: Secondary | ICD-10-CM | POA: Diagnosis not present

## 2015-06-15 DIAGNOSIS — R2689 Other abnormalities of gait and mobility: Secondary | ICD-10-CM | POA: Diagnosis not present

## 2015-06-15 DIAGNOSIS — M6281 Muscle weakness (generalized): Secondary | ICD-10-CM | POA: Diagnosis not present

## 2015-06-15 DIAGNOSIS — M25561 Pain in right knee: Secondary | ICD-10-CM | POA: Diagnosis not present

## 2015-06-15 DIAGNOSIS — M25661 Stiffness of right knee, not elsewhere classified: Secondary | ICD-10-CM | POA: Diagnosis not present

## 2015-06-20 DIAGNOSIS — M25661 Stiffness of right knee, not elsewhere classified: Secondary | ICD-10-CM | POA: Diagnosis not present

## 2015-06-20 DIAGNOSIS — R2689 Other abnormalities of gait and mobility: Secondary | ICD-10-CM | POA: Diagnosis not present

## 2015-06-20 DIAGNOSIS — M6281 Muscle weakness (generalized): Secondary | ICD-10-CM | POA: Diagnosis not present

## 2015-06-20 DIAGNOSIS — M25561 Pain in right knee: Secondary | ICD-10-CM | POA: Diagnosis not present

## 2015-06-22 DIAGNOSIS — M25561 Pain in right knee: Secondary | ICD-10-CM | POA: Diagnosis not present

## 2015-06-22 DIAGNOSIS — M6281 Muscle weakness (generalized): Secondary | ICD-10-CM | POA: Diagnosis not present

## 2015-06-22 DIAGNOSIS — R2689 Other abnormalities of gait and mobility: Secondary | ICD-10-CM | POA: Diagnosis not present

## 2015-06-22 DIAGNOSIS — M25661 Stiffness of right knee, not elsewhere classified: Secondary | ICD-10-CM | POA: Diagnosis not present

## 2015-06-27 DIAGNOSIS — M25561 Pain in right knee: Secondary | ICD-10-CM | POA: Diagnosis not present

## 2015-06-27 DIAGNOSIS — R2689 Other abnormalities of gait and mobility: Secondary | ICD-10-CM | POA: Diagnosis not present

## 2015-06-27 DIAGNOSIS — M6281 Muscle weakness (generalized): Secondary | ICD-10-CM | POA: Diagnosis not present

## 2015-06-27 DIAGNOSIS — M25661 Stiffness of right knee, not elsewhere classified: Secondary | ICD-10-CM | POA: Diagnosis not present

## 2015-06-29 DIAGNOSIS — R2689 Other abnormalities of gait and mobility: Secondary | ICD-10-CM | POA: Diagnosis not present

## 2015-06-29 DIAGNOSIS — M6281 Muscle weakness (generalized): Secondary | ICD-10-CM | POA: Diagnosis not present

## 2015-06-29 DIAGNOSIS — M25661 Stiffness of right knee, not elsewhere classified: Secondary | ICD-10-CM | POA: Diagnosis not present

## 2015-06-29 DIAGNOSIS — M25561 Pain in right knee: Secondary | ICD-10-CM | POA: Diagnosis not present

## 2015-07-05 DIAGNOSIS — M25561 Pain in right knee: Secondary | ICD-10-CM | POA: Diagnosis not present

## 2015-07-05 DIAGNOSIS — M6281 Muscle weakness (generalized): Secondary | ICD-10-CM | POA: Diagnosis not present

## 2015-07-05 DIAGNOSIS — R2689 Other abnormalities of gait and mobility: Secondary | ICD-10-CM | POA: Diagnosis not present

## 2015-07-05 DIAGNOSIS — M25661 Stiffness of right knee, not elsewhere classified: Secondary | ICD-10-CM | POA: Diagnosis not present

## 2015-07-10 DIAGNOSIS — M25572 Pain in left ankle and joints of left foot: Secondary | ICD-10-CM | POA: Diagnosis not present

## 2015-07-10 DIAGNOSIS — M1990 Unspecified osteoarthritis, unspecified site: Secondary | ICD-10-CM | POA: Diagnosis not present

## 2015-07-12 DIAGNOSIS — M6281 Muscle weakness (generalized): Secondary | ICD-10-CM | POA: Diagnosis not present

## 2015-07-12 DIAGNOSIS — M25561 Pain in right knee: Secondary | ICD-10-CM | POA: Diagnosis not present

## 2015-08-29 DIAGNOSIS — M25572 Pain in left ankle and joints of left foot: Secondary | ICD-10-CM | POA: Diagnosis not present

## 2015-09-05 ENCOUNTER — Telehealth: Payer: Self-pay | Admitting: Cardiology

## 2015-09-05 DIAGNOSIS — M25572 Pain in left ankle and joints of left foot: Secondary | ICD-10-CM | POA: Diagnosis not present

## 2015-09-05 NOTE — Telephone Encounter (Signed)
Patient calling the office for samples of medication:   1.  What medication and dosage are you requesting samples for? Xarelto-   2.  Are you currently out of this medication? no  3. Are you requesting samples to get you through until a mail order prescription arrives?yes

## 2015-09-06 NOTE — Telephone Encounter (Signed)
Medication samples have been provided to the patient.  Drug name: xarelto 20mg   Qty: 10 (2 bottles)  LOT: [id number]  Exp.Date: 12/2017  Samples left at front desk for patient pick-up.   Fidel Levy 9:56 AM 09/06/2015

## 2015-09-06 NOTE — Telephone Encounter (Signed)
Patient notified samples are ready for pickup.

## 2015-09-28 DIAGNOSIS — L570 Actinic keratosis: Secondary | ICD-10-CM | POA: Diagnosis not present

## 2015-09-28 DIAGNOSIS — L821 Other seborrheic keratosis: Secondary | ICD-10-CM | POA: Diagnosis not present

## 2015-10-11 ENCOUNTER — Ambulatory Visit (INDEPENDENT_AMBULATORY_CARE_PROVIDER_SITE_OTHER): Payer: Medicare Other | Admitting: Internal Medicine

## 2015-10-11 ENCOUNTER — Encounter: Payer: Self-pay | Admitting: Internal Medicine

## 2015-10-11 VITALS — BP 132/72 | HR 63 | Temp 98.2°F | Ht 71.0 in | Wt 225.4 lb

## 2015-10-11 DIAGNOSIS — I482 Chronic atrial fibrillation, unspecified: Secondary | ICD-10-CM

## 2015-10-11 DIAGNOSIS — I10 Essential (primary) hypertension: Secondary | ICD-10-CM | POA: Diagnosis not present

## 2015-10-11 DIAGNOSIS — E119 Type 2 diabetes mellitus without complications: Secondary | ICD-10-CM

## 2015-10-11 DIAGNOSIS — E785 Hyperlipidemia, unspecified: Secondary | ICD-10-CM | POA: Diagnosis not present

## 2015-10-11 DIAGNOSIS — Z09 Encounter for follow-up examination after completed treatment for conditions other than malignant neoplasm: Secondary | ICD-10-CM

## 2015-10-11 LAB — BASIC METABOLIC PANEL
BUN: 17 mg/dL (ref 6–23)
CALCIUM: 9.8 mg/dL (ref 8.4–10.5)
CO2: 32 mEq/L (ref 19–32)
Chloride: 105 mEq/L (ref 96–112)
Creatinine, Ser: 1.24 mg/dL (ref 0.40–1.50)
GFR: 59.33 mL/min — ABNORMAL LOW (ref >60.00)
Glucose, Bld: 126 mg/dL — ABNORMAL HIGH (ref 70–99)
Potassium: 4.2 mEq/L (ref 3.5–5.1)
Sodium: 141 mEq/L (ref 135–145)

## 2015-10-11 LAB — HEMOGLOBIN A1C: Hgb A1c MFr Bld: 6.1 % (ref 4.6–6.5)

## 2015-10-11 MED ORDER — AMLODIPINE BESYLATE 2.5 MG PO TABS: 2.5000 mg | ORAL_TABLET | Freq: Every day | ORAL | Status: DC

## 2015-10-11 MED ORDER — RIVAROXABAN 20 MG PO TABS: 20.0000 mg | ORAL_TABLET | Freq: Every day | ORAL | Status: DC

## 2015-10-11 MED ORDER — BENAZEPRIL HCL 40 MG PO TABS: 40.0000 mg | ORAL_TABLET | Freq: Every day | ORAL | Status: DC

## 2015-10-11 MED ORDER — PANTOPRAZOLE SODIUM 40 MG PO TBEC: 40.0000 mg | DELAYED_RELEASE_TABLET | Freq: Every day | ORAL | Status: DC

## 2015-10-11 MED ORDER — METOPROLOL SUCCINATE ER 25 MG PO TB24: 25.0000 mg | ORAL_TABLET | Freq: Every day | ORAL | Status: DC

## 2015-10-11 MED ORDER — ATORVASTATIN CALCIUM 20 MG PO TABS: 10.0000 mg | ORAL_TABLET | Freq: Every day | ORAL | Status: DC

## 2015-10-11 NOTE — Assessment & Plan Note (Signed)
DM: Feet exam negative, check A1c. Recommend eye checks regularly, feet care discuss, states that the last eye check was less than a year ago HTN: Well-controlled, check a BMP Hyperlipidemia: Refill Lipitor Atrial fibrillation: rate well-controlled, samples of Xarelto provided RTC 6 months for a physical exam

## 2015-10-11 NOTE — Progress Notes (Signed)
Pre visit review using our clinic review tool, if applicable. No additional management support is needed unless otherwise documented below in the visit note. 

## 2015-10-11 NOTE — Patient Instructions (Signed)
Get your blood work before you leave     Check the  blood pressure 2 or 3 times a  week  Be sure your blood pressure is between 110/65 and  145/85.  if it is consistently higher or lower, let me know   Next visit  for a complete physical exam in approximately 6 months, fasting.    Please schedule an appointment at the front desk       Diabetes and Foot Care Diabetes may cause you to have problems because of poor blood supply (circulation) to your feet and legs. This may cause the skin on your feet to become thinner, break easier, and heal more slowly. Your skin may become dry, and the skin may peel and crack. You may also have nerve damage in your legs and feet causing decreased feeling in them. You may not notice minor injuries to your feet that could lead to infections or more serious problems. Taking care of your feet is one of the most important things you can do for yourself.  HOME CARE INSTRUCTIONS  Wear shoes at all times, even in the house. Do not go barefoot. Bare feet are easily injured.  Check your feet daily for blisters, cuts, and redness. If you cannot see the bottom of your feet, use a mirror or ask someone for help.  Wash your feet with warm water (do not use hot water) and mild soap. Then pat your feet and the areas between your toes until they are completely dry. Do not soak your feet as this can dry your skin.  Apply a moisturizing lotion or petroleum jelly (that does not contain alcohol and is unscented) to the skin on your feet and to dry, brittle toenails. Do not apply lotion between your toes.  Trim your toenails straight across. Do not dig under them or around the cuticle. File the edges of your nails with an emery board or nail file.  Do not cut corns or calluses or try to remove them with medicine.  Wear clean socks or stockings every day. Make sure they are not too tight. Do not wear knee-high stockings since they may decrease blood flow to your legs.  Wear  shoes that fit properly and have enough cushioning. To break in new shoes, wear them for just a few hours a day. This prevents you from injuring your feet. Always look in your shoes before you put them on to be sure there are no objects inside.  Do not cross your legs. This may decrease the blood flow to your feet.  If you find a minor scrape, cut, or break in the skin on your feet, keep it and the skin around it clean and dry. These areas may be cleansed with mild soap and water. Do not cleanse the area with peroxide, alcohol, or iodine.  When you remove an adhesive bandage, be sure not to damage the skin around it.  If you have a wound, look at it several times a day to make sure it is healing.  Do not use heating pads or hot water bottles. They may burn your skin. If you have lost feeling in your feet or legs, you may not know it is happening until it is too late.  Make sure your health care provider performs a complete foot exam at least annually or more often if you have foot problems. Report any cuts, sores, or bruises to your health care provider immediately. SEEK MEDICAL CARE IF:  You have an injury that is not healing.  You have cuts or breaks in the skin.  You have an ingrown nail.  You notice redness on your legs or feet.  You feel burning or tingling in your legs or feet.  You have pain or cramps in your legs and feet.  Your legs or feet are numb.  Your feet always feel cold. SEEK IMMEDIATE MEDICAL CARE IF:   There is increasing redness, swelling, or pain in or around a wound.  There is a red line that goes up your leg.  Pus is coming from a wound.  You develop a fever or as directed by your health care provider.  You notice a bad smell coming from an ulcer or wound.   This information is not intended to replace advice given to you by your health care provider. Make sure you discuss any questions you have with your health care provider.   Document Released:  11/22/2000 Document Revised: 07/28/2013 Document Reviewed: 05/04/2013 Elsevier Interactive Patient Education [employer].

## 2015-10-11 NOTE — Progress Notes (Signed)
Subjective:    Patient ID: [name], male    DOB: 05/24/1931, 81 y.o.   MRN: [id number]  DOS:  10/11/2015 Type of visit - description : Routine checkup Interval history: Hypertension: Good compliance with medications, ambulatory BPs in the 130s. DM: Labs reviewed, due for a A1c. On diet control, no ambulatory CBGs Hyperlipidemia: Well-controlled per last FLP, needs a refill. Atrial fibrillation: Needs samples of xarelto  Review of Systems Denies chest pain or difficulty breathing. Lower extremity edema well-controlled. No nausea, vomiting, diarrhea. No blood in the stools.  Past Medical History  Diagnosis Date  . CAD (coronary artery disease)     s/p CABG 1993 (L-LAD, S-RI, S-D1);   echo 1/10: EF 55%;    myoview 12/09: inf MI, no ischemia  . Hyperlipidemia   . HTN (hypertension)   . Folliculitis   . Obesity   . Osteoarthritis   . Irritable bladder   . Family history of malignant neoplasm of gastrointestinal tract   . Myocardial infarction ([place])   . Candida esophagitis ([place]) 2013    EGD   . Antral gastritis 2013    EGD   . Hiatal hernia   . Atrial fibrillation ([place])   . Anemia     Past Surgical History  Procedure Laterality Date  . Coronary artery bypass graft      graft '92: LIMA-LAD, SVG - D2,R1, D1  . Cholecystectomy      laparoscopic '92  . Mole removal  2001 and 2008  . Pilonidal cyst excision    . Enteroscopy  08/22/2012    Procedure: ENTEROSCOPY;  Surgeon: Juanita Craver, MD;  Location: [facility];  Service: Endoscopy;  Laterality: N/A;  . Knee surgery      Social History   Social History  . Marital Status: Widowed    Spouse Name: N/A  . Number of Children: 1  . Years of Education: N/A   Occupational History  . Retired    Social History Main Topics  . Smoking status: Former Smoker -- 16 years    Quit date: 08/14/1964  . Smokeless tobacco: Never Used     Comment: quit 1965  . Alcohol Use: No  . Drug Use: No  . Sexual Activity: Not on file    Other Topics Concern  . 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 Married '57- widowed 2023-03-31. 1 son -'40- died '82 MVA.    Lives by himself   No immediate family. Emergency contact : Daron Offer [phone] (friend)        Medication List       This list is accurate as of: 10/11/15  6:22 PM.  Always use your most recent med list.               amLODipine 2.5 MG tablet  Commonly known as:  NORVASC  Take 1 tablet (2.5 mg total) by mouth daily.     atorvastatin 20 MG tablet  Commonly known as:  LIPITOR  Take 0.5 tablets (10 mg total) by mouth daily.     benazepril 40 MG tablet  Commonly known as:  LOTENSIN  Take 1 tablet (40 mg total) by mouth daily.     cyclobenzaprine 5 MG tablet  Commonly known as:  FLEXERIL  Take 1 at bedtime as needed for muscle relaxant. Caution: May cause drowsiness.     metoprolol succinate 25 MG 24 hr tablet  Commonly known as:  TOPROL-XL  Take 1 tablet (25 mg total) by mouth daily.     multivitamin capsule  Take 1 capsule by mouth daily.     nitroGLYCERIN 0.4 MG SL tablet  Commonly known as:  NITROSTAT  Place 1 tablet (0.4 mg total) under the tongue every 5 (five) minutes as needed for chest pain.     pantoprazole 40 MG tablet  Commonly known as:  PROTONIX  Take 1 tablet (40 mg total) by mouth daily.     PROBIOTIC DAILY PO  Take 1 tablet by mouth daily.     rivaroxaban 20 MG Tabs tablet  Commonly known as:  XARELTO  Take 1 tablet by mouth  daily with supper     sulfacetamide 10 % ophthalmic solution  Commonly known as:  BLEPH-10  Place 1-2 drops into the right eye every 3 (three) hours while awake.     vitamin B-12 1000 MCG tablet  Commonly known as:  CYANOCOBALAMIN  Take 1,000 mcg by mouth daily.     Vitamin D 1000 UNITS capsule  Take 1,000 Units by mouth daily.           Objective:   Physical Exam BP 132/72 mmHg  Pulse 63  Temp(Src) 98.2 F (36.8 C) (Oral)  Ht 5\' 11"  (1.803 m)  Wt 225 lb 6 oz  (102.229 kg)  BMI 31.45 kg/m2  SpO2 97%  General:   Well developed, well nourished . NAD.  HEENT:  Normocephalic . Face symmetric, atraumatic Lungs:  CTA B Normal respiratory effort, no intercostal retractions, no accessory muscle use. Heart: irreg,   no murmur.  No pretibial edema bilaterally  Diabetic feet exam: No edema, good pedal pulses, pinprick examination normal Neurologic:  alert & oriented X3.  Speech normal, gait appropriate for age and unassisted Psych--  Cognition and judgment appear intact.  Cooperative with normal attention span and concentration.  Behavior appropriate. No anxious or depressed appearing.       Assessment & Plan:   Assessment >  DM --diet control, no neuropathy HTN Hyperlipidemia CV: --CAD, CABG 1993, Myoview 2009 no ischemia. --Atrial fibrillation -- rate control, xarelto Venous insufficiency, mild LE edma LUTS DJD GI: --Candida esophagitis, antral gastritis --->  2013 per EGD --HH  Plan: DM: Feet exam negative, check A1c. Recommend eye checks regularly, feet care discuss, states that the last eye check was less than a year ago HTN: Well-controlled, check a BMP Hyperlipidemia: Refill Lipitor Atrial fibrillation: rate well-controlled, samples of Xarelto provided RTC 6 months for a physical exam

## 2015-10-13 DIAGNOSIS — M1990 Unspecified osteoarthritis, unspecified site: Secondary | ICD-10-CM | POA: Diagnosis not present

## 2015-11-14 ENCOUNTER — Other Ambulatory Visit: Payer: Self-pay | Admitting: Cardiology

## 2015-12-18 ENCOUNTER — Other Ambulatory Visit: Payer: Self-pay | Admitting: Internal Medicine

## 2015-12-23 ENCOUNTER — Encounter (HOSPITAL_COMMUNITY): Payer: Self-pay | Admitting: Emergency Medicine

## 2015-12-23 ENCOUNTER — Emergency Department (HOSPITAL_COMMUNITY)
Admission: EM | Admit: 2015-12-23 | Discharge: 2015-12-23 | Disposition: A | Discharge status: Home / Self Care | Payer: Medicare Other | Attending: Emergency Medicine | Admitting: Emergency Medicine

## 2015-12-23 DIAGNOSIS — I252 Old myocardial infarction: Secondary | ICD-10-CM | POA: Insufficient documentation

## 2015-12-23 DIAGNOSIS — R2231 Localized swelling, mass and lump, right upper limb: Secondary | ICD-10-CM | POA: Insufficient documentation

## 2015-12-23 DIAGNOSIS — M2540 Effusion, unspecified joint: Secondary | ICD-10-CM | POA: Diagnosis not present

## 2015-12-23 DIAGNOSIS — K2950 Unspecified chronic gastritis without bleeding: Secondary | ICD-10-CM | POA: Diagnosis not present

## 2015-12-23 DIAGNOSIS — Z87891 Personal history of nicotine dependence: Secondary | ICD-10-CM | POA: Insufficient documentation

## 2015-12-23 DIAGNOSIS — E669 Obesity, unspecified: Secondary | ICD-10-CM | POA: Insufficient documentation

## 2015-12-23 DIAGNOSIS — I2510 Atherosclerotic heart disease of native coronary artery without angina pectoris: Secondary | ICD-10-CM | POA: Diagnosis not present

## 2015-12-23 DIAGNOSIS — I10 Essential (primary) hypertension: Secondary | ICD-10-CM | POA: Insufficient documentation

## 2015-12-23 DIAGNOSIS — M1990 Unspecified osteoarthritis, unspecified site: Secondary | ICD-10-CM | POA: Insufficient documentation

## 2015-12-23 DIAGNOSIS — Z8619 Personal history of other infectious and parasitic diseases: Secondary | ICD-10-CM | POA: Diagnosis not present

## 2015-12-23 DIAGNOSIS — Z7901 Long term (current) use of anticoagulants: Secondary | ICD-10-CM | POA: Insufficient documentation

## 2015-12-23 DIAGNOSIS — M25611 Stiffness of right shoulder, not elsewhere classified: Secondary | ICD-10-CM | POA: Diagnosis not present

## 2015-12-23 DIAGNOSIS — E785 Hyperlipidemia, unspecified: Secondary | ICD-10-CM | POA: Diagnosis not present

## 2015-12-23 DIAGNOSIS — Z79899 Other long term (current) drug therapy: Secondary | ICD-10-CM | POA: Diagnosis not present

## 2015-12-23 DIAGNOSIS — Z872 Personal history of diseases of the skin and subcutaneous tissue: Secondary | ICD-10-CM | POA: Insufficient documentation

## 2015-12-23 DIAGNOSIS — M7989 Other specified soft tissue disorders: Secondary | ICD-10-CM | POA: Diagnosis not present

## 2015-12-23 DIAGNOSIS — Z951 Presence of aortocoronary bypass graft: Secondary | ICD-10-CM | POA: Insufficient documentation

## 2015-12-23 DIAGNOSIS — I4891 Unspecified atrial fibrillation: Secondary | ICD-10-CM | POA: Diagnosis not present

## 2015-12-23 DIAGNOSIS — M79601 Pain in right arm: Secondary | ICD-10-CM | POA: Diagnosis present

## 2015-12-23 DIAGNOSIS — M79621 Pain in right upper arm: Secondary | ICD-10-CM | POA: Diagnosis not present

## 2015-12-23 DIAGNOSIS — Z87448 Personal history of other diseases of urinary system: Secondary | ICD-10-CM | POA: Diagnosis not present

## 2015-12-23 DIAGNOSIS — M25421 Effusion, right elbow: Secondary | ICD-10-CM

## 2015-12-23 MED ORDER — HYDROCODONE-ACETAMINOPHEN 5-325 MG PO TABS: 1.0000 | ORAL_TABLET | Freq: Four times a day (QID) | ORAL | Status: DC | PRN

## 2015-12-23 MED ORDER — HYDROCODONE-ACETAMINOPHEN 5-325 MG PO TABS
1.0000 | ORAL_TABLET | Freq: Once | ORAL | Status: AC
  Administered 2015-12-23: 1 via ORAL
  Filled 2015-12-23: qty 1

## 2015-12-23 NOTE — ED Notes (Addendum)
Per pt, was playing golf a week ago and had a sharp pain on his R upper arm, bruising and swelling has gotten steadily worse in R upper arm. Pt denies injury. Pulses strong in R arm, full ROM. Pain 8/10. Pt takes Xarelto.

## 2015-12-23 NOTE — ED Provider Notes (Signed)
CSN: [id number]     Arrival date & time 12/23/15  1936 History   First MD Initiated Contact with Patient 12/23/15 2231     Chief Complaint  Patient presents with  . Arm Pain     (Consider location/radiation/quality/duration/timing/severity/associated sxs/prior Treatment) HPI patient presents with concern of pain, swelling in the right arm. One week ago, the patient was playing golf. He felt the sudden onset of sharp pain in the right upper arm anteriorly. Since that time there's been sustained discomfort, but over the past 24 hours, the swelling, discoloration has become more pronounced. No distal loss of sensation or strength. No shoulder pain, no wrist pain, no chest pain, no dyspnea. Patient takes all medication as directed, including Xarelto. Patient has taken no additional pain medication for his bicep pain. Patient notes that can flex and extend the arm. Today, the patient went to urgent care, was sent here for evaluation.  Past Medical History  Diagnosis Date  . CAD (coronary artery disease)     s/p CABG 1993 (L-LAD, S-RI, S-D1);   echo 1/10: EF 55%;    myoview 12/09: inf MI, no ischemia  . Hyperlipidemia   . HTN (hypertension)   . Folliculitis   . Obesity   . Osteoarthritis   . Irritable bladder   . Family history of malignant neoplasm of gastrointestinal tract   . Myocardial infarction ([place])   . Candida esophagitis ([place]) 2013    EGD   . Antral gastritis 2013    EGD   . Hiatal hernia   . Atrial fibrillation ([place])   . Anemia    Past Surgical History  Procedure Laterality Date  . Coronary artery bypass graft      graft '92: LIMA-LAD, SVG - D2,R1, D1  . Cholecystectomy      laparoscopic '92  . Mole removal  2001 and 2008  . Pilonidal cyst excision    . Enteroscopy  08/22/2012    Procedure: ENTEROSCOPY;  Surgeon: Juanita Craver, MD;  Location: [facility];  Service: Endoscopy;  Laterality: N/A;  . Knee surgery     Family History  Problem Relation Age of Onset    . Coronary artery disease Father     and brother  . Heart attack Father     and brother-fatal  . Stroke Father   . Breast cancer Mother   . Alzheimer's disease Mother   . Colon cancer Maternal Uncle   . Esophageal cancer Neg Hx   . Stomach cancer Neg Hx   . Rectal cancer Neg Hx   . Prostate cancer Neg Hx    Social History  Substance Use Topics  . Smoking status: Former Smoker -- 16 years    Quit date: 08/14/1964  . Smokeless tobacco: Never Used     Comment: quit 1965  . Alcohol Use: No    Review of Systems  Constitutional:       Per HPI, otherwise negative  HENT:       Per HPI, otherwise negative  Respiratory:       Per HPI, otherwise negative  Cardiovascular:       Per HPI, otherwise negative  Gastrointestinal: Negative for vomiting.  Endocrine:       Negative aside from HPI  Genitourinary:       Neg aside from HPI   Musculoskeletal:       Per HPI, otherwise negative  Skin: Positive for color change.  Neurological: Negative for syncope.      Allergies  Review of patient's allergies indicates no known allergies.  Home Medications   Prior to Admission medications   Medication Sig Start Date End Date Taking? Authorizing Provider  amLODipine (NORVASC) 2.5 MG tablet Take 1 tablet (2.5 mg total) by mouth daily. 10/11/15  Yes Colon Branch, MD  atorvastatin (LIPITOR) 10 MG tablet Take 10 mg by mouth at bedtime. 10/16/15  Yes Historical Provider, MD  benazepril (LOTENSIN) 40 MG tablet Take 1 tablet (40 mg total) by mouth daily. 10/11/15  Yes Colon Branch, MD  Cholecalciferol (VITAMIN D) 1000 UNITS capsule Take 1,000 Units by mouth daily.     Yes Historical Provider, MD  cyclobenzaprine (FLEXERIL) 5 MG tablet Take 1 at bedtime as needed for muscle relaxant. Caution: May cause drowsiness. Patient taking differently: Take 5 mg by mouth at bedtime as needed for muscle spasms. . Caution: May cause drowsiness. 06/16/14  Yes Jacqulyn Cane, MD  metoprolol succinate (TOPROL-XL) 25 MG  24 hr tablet Take 1 tablet (25 mg total) by mouth daily. 10/11/15  Yes Colon Branch, MD  Multiple Vitamin (MULTIVITAMIN WITH MINERALS) TABS tablet Take 1 tablet by mouth daily.   Yes Historical Provider, MD  pantoprazole (PROTONIX) 40 MG tablet Take 1 tablet (40 mg total) by mouth daily. 10/11/15  Yes Colon Branch, MD  Probiotic Product (PROBIOTIC DAILY PO) Take 1 tablet by mouth daily.   Yes Historical Provider, MD  rivaroxaban (XARELTO) 20 MG TABS tablet Take 1 tablet by mouth daily with supper.  Needs OV for further refills Patient taking differently: 20 mg daily with supper. Needs OV for further refills 11/14/15  Yes Lelon Perla, MD  vitamin B-12 (CYANOCOBALAMIN) 1000 MCG tablet Take 1,000 mcg by mouth daily.   Yes Historical Provider, MD  atorvastatin (LIPITOR) 20 MG tablet Take 0.5 tablets (10 mg total) by mouth daily. Patient not taking: Reported on 12/23/2015 10/11/15   Colon Branch, MD  nitroGLYCERIN (NITROSTAT) 0.4 MG SL tablet Place 1 tablet (0.4 mg total) under the tongue every 5 (five) minutes as needed for chest pain. Patient not taking: Reported on 04/10/2015 01/04/15   Lelon Perla, MD   BP 111/64 mmHg  Pulse 80  Temp(Src) 97.7 F (36.5 C) (Oral)  Resp 20  Ht 5\' 10"  (1.778 m)  Wt 217 lb (98.431 kg)  BMI 31.14 kg/m2  SpO2 96% Physical Exam  Constitutional: He is oriented to person, place, and time. He appears well-developed. No distress.  HENT:  Head: Normocephalic and atraumatic.  Eyes: Conjunctivae and EOM are normal.  Cardiovascular: Normal rate and regular rhythm.   Patient has 2+ pulses in the right radial, good cap refill throughout the right forearm, hand.  Pulmonary/Chest: Effort normal. No stridor. No respiratory distress.  Abdominal: He exhibits no distension.  Musculoskeletal: He exhibits no edema.       Right shoulder: Normal.       Right elbow: Normal.      Right wrist: Normal.       Arms: Neurological: He is alert and oriented to person, place, and time.    Skin: Skin is warm and dry.  Psychiatric: He has a normal mood and affect.  Nursing note and vitals reviewed.   ED Course  Procedures (including critical care time)  Pulse oximetry 99% room air normal  11:16 PM On repeat exam the patient remains in similar condition. And informed him and his companion, but the ultrasound is not available.  We discussed return precautions, and the need  to follow-up in the morning here, for ultrasound evaluation. Patient will also be provided referral to orthopedics. MDM  History presents with right upper cavity swelling, pain, discomfort. There is no distal neurovascular compromise, and low suspicion for compartment syndrome. However, both DVT and hemorrhage following disruption of intramuscular artery remain considerations. Patient has preserved biceps function, but will also follow up with orthopedist. Patient will return in the morning for ultrasound exam. Patient is already taking Xarelto for anticoagulation, additional heparin not indicated.   Carmin Muskrat, MD 12/23/15 [phone]

## 2015-12-23 NOTE — Discharge Instructions (Signed)
As discussed, with your ongoing pain and swelling in your arm it is very important that you return here tomorrow for ultrasound evaluation.  It is also important that you follow-up with our orthopedic colleagues for further evaluation of your pain, regardless of tomorrow's ultrasound findings.

## 2015-12-24 ENCOUNTER — Ambulatory Visit (HOSPITAL_COMMUNITY)
Admission: RE | Admit: 2015-12-24 | Discharge: 2015-12-24 | Disposition: A | Discharge status: Home / Self Care | Payer: Medicare Other | Source: Ambulatory Visit | Attending: Emergency Medicine | Admitting: Emergency Medicine

## 2015-12-24 DIAGNOSIS — M7989 Other specified soft tissue disorders: Secondary | ICD-10-CM

## 2015-12-24 DIAGNOSIS — M79609 Pain in unspecified limb: Secondary | ICD-10-CM

## 2015-12-24 DIAGNOSIS — M79601 Pain in right arm: Secondary | ICD-10-CM | POA: Insufficient documentation

## 2015-12-24 DIAGNOSIS — S46111A Strain of muscle, fascia and tendon of long head of biceps, right arm, initial encounter: Secondary | ICD-10-CM | POA: Diagnosis not present

## 2015-12-24 NOTE — Progress Notes (Signed)
VASCULAR LAB PRELIMINARY  PRELIMINARY  PRELIMINARY  PRELIMINARY  Right upper extremity venous duplex completed.    Preliminary report:  There is no DVT or SVT noted in the right upper extremity.  There could be a possible, significant muscle tear with hematoma noted in the right upper extremity from [facility] to axilla.  Arcelia Pals, RVT 12/24/2015, 8:38 AM

## 2015-12-26 ENCOUNTER — Other Ambulatory Visit: Payer: Self-pay | Admitting: Orthopedic Surgery

## 2015-12-26 DIAGNOSIS — S46111D Strain of muscle, fascia and tendon of long head of biceps, right arm, subsequent encounter: Secondary | ICD-10-CM | POA: Diagnosis not present

## 2015-12-26 DIAGNOSIS — M25511 Pain in right shoulder: Secondary | ICD-10-CM

## 2015-12-30 ENCOUNTER — Ambulatory Visit
Admission: RE | Admit: 2015-12-30 | Discharge: 2015-12-30 | Disposition: A | Discharge status: Home / Self Care | Payer: Medicare Other | Source: Ambulatory Visit | Attending: Orthopedic Surgery | Admitting: Orthopedic Surgery

## 2015-12-30 DIAGNOSIS — M75101 Unspecified rotator cuff tear or rupture of right shoulder, not specified as traumatic: Secondary | ICD-10-CM | POA: Diagnosis not present

## 2015-12-30 DIAGNOSIS — M25511 Pain in right shoulder: Secondary | ICD-10-CM

## 2016-01-01 DIAGNOSIS — D1801 Hemangioma of skin and subcutaneous tissue: Secondary | ICD-10-CM | POA: Diagnosis not present

## 2016-01-01 DIAGNOSIS — L570 Actinic keratosis: Secondary | ICD-10-CM | POA: Diagnosis not present

## 2016-01-01 DIAGNOSIS — L821 Other seborrheic keratosis: Secondary | ICD-10-CM | POA: Diagnosis not present

## 2016-01-02 DIAGNOSIS — M25611 Stiffness of right shoulder, not elsewhere classified: Secondary | ICD-10-CM | POA: Diagnosis not present

## 2016-01-02 DIAGNOSIS — M25511 Pain in right shoulder: Secondary | ICD-10-CM | POA: Diagnosis not present

## 2016-01-02 DIAGNOSIS — M6281 Muscle weakness (generalized): Secondary | ICD-10-CM | POA: Diagnosis not present

## 2016-01-02 DIAGNOSIS — S46111D Strain of muscle, fascia and tendon of long head of biceps, right arm, subsequent encounter: Secondary | ICD-10-CM | POA: Diagnosis not present

## 2016-01-04 DIAGNOSIS — M6281 Muscle weakness (generalized): Secondary | ICD-10-CM | POA: Diagnosis not present

## 2016-01-04 DIAGNOSIS — M25611 Stiffness of right shoulder, not elsewhere classified: Secondary | ICD-10-CM | POA: Diagnosis not present

## 2016-01-04 DIAGNOSIS — M25511 Pain in right shoulder: Secondary | ICD-10-CM | POA: Diagnosis not present

## 2016-01-08 DIAGNOSIS — M6281 Muscle weakness (generalized): Secondary | ICD-10-CM | POA: Diagnosis not present

## 2016-01-08 DIAGNOSIS — M25611 Stiffness of right shoulder, not elsewhere classified: Secondary | ICD-10-CM | POA: Diagnosis not present

## 2016-01-08 DIAGNOSIS — M25511 Pain in right shoulder: Secondary | ICD-10-CM | POA: Diagnosis not present

## 2016-01-12 DIAGNOSIS — M25511 Pain in right shoulder: Secondary | ICD-10-CM | POA: Diagnosis not present

## 2016-01-12 DIAGNOSIS — M25611 Stiffness of right shoulder, not elsewhere classified: Secondary | ICD-10-CM | POA: Diagnosis not present

## 2016-01-12 DIAGNOSIS — M6281 Muscle weakness (generalized): Secondary | ICD-10-CM | POA: Diagnosis not present

## 2016-01-16 ENCOUNTER — Telehealth: Payer: Self-pay | Admitting: Cardiology

## 2016-01-16 DIAGNOSIS — H353131 Nonexudative age-related macular degeneration, bilateral, early dry stage: Secondary | ICD-10-CM | POA: Diagnosis not present

## 2016-01-16 DIAGNOSIS — H40003 Preglaucoma, unspecified, bilateral: Secondary | ICD-10-CM | POA: Diagnosis not present

## 2016-01-16 DIAGNOSIS — M25611 Stiffness of right shoulder, not elsewhere classified: Secondary | ICD-10-CM | POA: Diagnosis not present

## 2016-01-16 DIAGNOSIS — H43813 Vitreous degeneration, bilateral: Secondary | ICD-10-CM | POA: Diagnosis not present

## 2016-01-16 DIAGNOSIS — M25511 Pain in right shoulder: Secondary | ICD-10-CM | POA: Diagnosis not present

## 2016-01-16 DIAGNOSIS — M6281 Muscle weakness (generalized): Secondary | ICD-10-CM | POA: Diagnosis not present

## 2016-01-16 DIAGNOSIS — H2513 Age-related nuclear cataract, bilateral: Secondary | ICD-10-CM | POA: Diagnosis not present

## 2016-01-16 NOTE — Telephone Encounter (Signed)
New Message  Patient calling the office for samples of medication:   1.  What medication and dosage are you requesting samples for? Xarelto   2.  Are you currently out of this medication? Yes   Comments: Pt would like samples brought to [facility] tomorrow if Dr. Stanford Breed is going to be there.

## 2016-01-16 NOTE — Telephone Encounter (Signed)
Medication Samples have been provided to the patient.  Drug name: Ahmed Prima: # 28  LOT: [id number]  Exp.Date: 07/2018  The patient has been instructed regarding the correct time, dose, and frequency of taking this medication, including desired effects and most common side effects.   Kathy Breach 3:42 PM 01/16/2016

## 2016-01-18 DIAGNOSIS — M25511 Pain in right shoulder: Secondary | ICD-10-CM | POA: Diagnosis not present

## 2016-01-18 DIAGNOSIS — M25611 Stiffness of right shoulder, not elsewhere classified: Secondary | ICD-10-CM | POA: Diagnosis not present

## 2016-01-18 DIAGNOSIS — M25561 Pain in right knee: Secondary | ICD-10-CM | POA: Diagnosis not present

## 2016-01-18 DIAGNOSIS — M6281 Muscle weakness (generalized): Secondary | ICD-10-CM | POA: Diagnosis not present

## 2016-01-25 DIAGNOSIS — M25611 Stiffness of right shoulder, not elsewhere classified: Secondary | ICD-10-CM | POA: Diagnosis not present

## 2016-01-25 DIAGNOSIS — M6281 Muscle weakness (generalized): Secondary | ICD-10-CM | POA: Diagnosis not present

## 2016-01-25 DIAGNOSIS — M25511 Pain in right shoulder: Secondary | ICD-10-CM | POA: Diagnosis not present

## 2016-01-25 DIAGNOSIS — M25561 Pain in right knee: Secondary | ICD-10-CM | POA: Diagnosis not present

## 2016-01-30 DIAGNOSIS — M25611 Stiffness of right shoulder, not elsewhere classified: Secondary | ICD-10-CM | POA: Diagnosis not present

## 2016-01-30 DIAGNOSIS — M6281 Muscle weakness (generalized): Secondary | ICD-10-CM | POA: Diagnosis not present

## 2016-01-30 DIAGNOSIS — M25511 Pain in right shoulder: Secondary | ICD-10-CM | POA: Diagnosis not present

## 2016-02-01 DIAGNOSIS — M25561 Pain in right knee: Secondary | ICD-10-CM | POA: Diagnosis not present

## 2016-02-01 DIAGNOSIS — M25611 Stiffness of right shoulder, not elsewhere classified: Secondary | ICD-10-CM | POA: Diagnosis not present

## 2016-02-01 DIAGNOSIS — S46111D Strain of muscle, fascia and tendon of long head of biceps, right arm, subsequent encounter: Secondary | ICD-10-CM | POA: Diagnosis not present

## 2016-02-01 DIAGNOSIS — M25511 Pain in right shoulder: Secondary | ICD-10-CM | POA: Diagnosis not present

## 2016-02-01 DIAGNOSIS — M6281 Muscle weakness (generalized): Secondary | ICD-10-CM | POA: Diagnosis not present

## 2016-02-07 ENCOUNTER — Other Ambulatory Visit: Payer: Self-pay | Admitting: Internal Medicine

## 2016-02-07 DIAGNOSIS — M25511 Pain in right shoulder: Secondary | ICD-10-CM | POA: Diagnosis not present

## 2016-02-07 DIAGNOSIS — M25611 Stiffness of right shoulder, not elsewhere classified: Secondary | ICD-10-CM | POA: Diagnosis not present

## 2016-02-07 DIAGNOSIS — M6281 Muscle weakness (generalized): Secondary | ICD-10-CM | POA: Diagnosis not present

## 2016-02-12 ENCOUNTER — Telehealth: Payer: Self-pay | Admitting: Internal Medicine

## 2016-02-12 NOTE — Telephone Encounter (Signed)
Caller name: Nayef Relation to pt: self Call back number: [phone] or [phone] Pharmacy:  Reason for call: Pt came by to verify his next appt and wanted to know if he can get samples of xarelto if we have any at the office. Pt is ok to be call if we do. Please advise.

## 2016-02-12 NOTE — Telephone Encounter (Signed)
Pt was made aware of the samples and his next appointment and he voices understanding.

## 2016-02-14 DIAGNOSIS — M25511 Pain in right shoulder: Secondary | ICD-10-CM | POA: Diagnosis not present

## 2016-02-14 DIAGNOSIS — M6281 Muscle weakness (generalized): Secondary | ICD-10-CM | POA: Diagnosis not present

## 2016-02-14 DIAGNOSIS — M25611 Stiffness of right shoulder, not elsewhere classified: Secondary | ICD-10-CM | POA: Diagnosis not present

## 2016-02-16 ENCOUNTER — Other Ambulatory Visit: Payer: Self-pay | Admitting: Internal Medicine

## 2016-02-21 DIAGNOSIS — M25611 Stiffness of right shoulder, not elsewhere classified: Secondary | ICD-10-CM | POA: Diagnosis not present

## 2016-02-21 DIAGNOSIS — M6281 Muscle weakness (generalized): Secondary | ICD-10-CM | POA: Diagnosis not present

## 2016-02-22 NOTE — Progress Notes (Signed)
HPI: FU coronary artery disease and atrial fibrillation. Patient is status post coronary artery bypassing graft in 1993 with a LIMA to the LAD, saphenous vein graft to the ramus intermedius and saphenous vein graft to the first diagonal. CT of the abdomen in January of 2009 showed no renal artery stenosis. There was no aortic aneurysm. Last echocardiogram in April of 2012 showed an ejection fraction of 45-50% and mild left atrial enlargement. Nuclear study in June 2014 showed scar in the mid/apical inferior wall and apex. There was a small area of ischemia in the base to mid anterior wall. The study was unchanged compared to May of 2012. The study was not gated. We have treated medically. Since I last saw him the patient denies any dyspnea on exertion, orthopnea, PND, pedal edema, palpitations, syncope or chest pain.  Current Outpatient Prescriptions  Medication Sig Dispense Refill  . amLODipine (NORVASC) 2.5 MG tablet Take 1 tablet (2.5 mg total) by mouth daily. 90 tablet 0  . atorvastatin (LIPITOR) 10 MG tablet Take 10 mg by mouth at bedtime.  2  . atorvastatin (LIPITOR) 20 MG tablet Take 0.5 tablets (10 mg total) by mouth daily. 45 tablet 1  . benazepril (LOTENSIN) 40 MG tablet Take 1 tablet (40 mg total) by mouth daily. 90 tablet 1  . Cholecalciferol (VITAMIN D) 1000 UNITS capsule Take 1,000 Units by mouth daily.      . Coenzyme Q10 (COQ10 PO) Take 1 capsule by mouth daily.    . cyclobenzaprine (FLEXERIL) 5 MG tablet Take 1 at bedtime as needed for muscle relaxant. Caution: May cause drowsiness. (Patient taking differently: Take 5 mg by mouth at bedtime as needed for muscle spasms. . Caution: May cause drowsiness.) 10 tablet 0  . diphenhydramine-acetaminophen (TYLENOL PM) 25-500 MG TABS tablet Take 2 tablets by mouth at bedtime.    Marland Kitchen HYDROcodone-acetaminophen (NORCO/VICODIN) 5-325 MG tablet Take 1 tablet by mouth every 6 (six) hours as needed for severe pain. 10 tablet 0  . Melatonin 5 MG  TABS Take 5 mg by mouth at bedtime.    . metoprolol succinate (TOPROL-XL) 25 MG 24 hr tablet Take 1 tablet (25 mg total) by mouth daily. 30 tablet 3  . Multiple Vitamin (MULTIVITAMIN WITH MINERALS) TABS tablet Take 1 tablet by mouth daily.    . nitroGLYCERIN (NITROSTAT) 0.4 MG SL tablet Place 1 tablet (0.4 mg total) under the tongue every 5 (five) minutes as needed for chest pain. 25 tablet 3  . pantoprazole (PROTONIX) 40 MG tablet Take 1 tablet (40 mg total) by mouth daily. 90 tablet 1  . Probiotic Product (PROBIOTIC DAILY PO) Take 1 tablet by mouth daily.    . rivaroxaban (XARELTO) 20 MG TABS tablet Take 1 tablet by mouth daily with supper.  Needs OV for further refills (Patient taking differently: 20 mg daily with supper. Needs OV for further refills) 90 tablet 0  . vitamin B-12 (CYANOCOBALAMIN) 1000 MCG tablet Take 1,000 mcg by mouth daily.     No current facility-administered medications for this visit.     Past Medical History  Diagnosis Date  . CAD (coronary artery disease)     s/p CABG 1993 (L-LAD, S-RI, S-D1);   echo 1/10: EF 55%;    myoview 12/09: inf MI, no ischemia  . Hyperlipidemia   . HTN (hypertension)   . Folliculitis   . Obesity   . Osteoarthritis   . Irritable bladder   . Family history of malignant neoplasm of  gastrointestinal tract   . Myocardial infarction ([place])   . Candida esophagitis ([place]) Mar 13, 2012    EGD   . Antral gastritis 03-13-2012    EGD   . Hiatal hernia   . Atrial fibrillation ([place])   . Anemia     Past Surgical History  Procedure Laterality Date  . Coronary artery bypass graft      graft 03-14-1991: LIMA-LAD, SVG - D2,R1, D1  . Cholecystectomy      laparoscopic March 14, 1991  . Mole removal  03-13-2000 and 03/14/07  . Pilonidal cyst excision    . Enteroscopy  08/22/2012    Procedure: ENTEROSCOPY;  Surgeon: Juanita Craver, MD;  Location: [facility];  Service: Endoscopy;  Laterality: N/A;  . Knee surgery      Social History   Social History  . Marital Status: Widowed     Spouse Name: N/A  . Number of Children: 1  . Years of Education: N/A   Occupational History  . Retired    Social History Main Topics  . Smoking status: Former Smoker -- 16 years    Quit date: 08/14/1964  . Smokeless tobacco: Never Used     Comment: quit 1965  . Alcohol Use: No  . Drug Use: No  . Sexual Activity: Not on file   Other Topics Concern  . 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 Married 2056/03/13- widowed 14-Mar-2023. 1 son -'43- died '29 MVA.    Lives by himself   No immediate family. Emergency contact : Daron Offer [phone] (friend)    Family History  Problem Relation Age of Onset  . Coronary artery disease Father     and brother  . Heart attack Father     and brother-fatal  . Stroke Father   . Breast cancer Mother   . Alzheimer's disease Mother   . Colon cancer Maternal Uncle   . Esophageal cancer Neg Hx   . Stomach cancer Neg Hx   . Rectal cancer Neg Hx   . Prostate cancer Neg Hx     ROS: Knee and ankle arthralgias but no fevers or chills, productive cough, hemoptysis, dysphasia, odynophagia, melena, hematochezia, dysuria, hematuria, rash, seizure activity, orthopnea, PND, pedal edema, claudication. Remaining systems are negative.  Physical Exam: Well-developed well-nourished in no acute distress.  Skin is warm and dry.  HEENT is normal.  Neck is supple.  Chest is clear to auscultation with normal expansion.  Cardiovascular exam is irregular Abdominal exam nontender or distended. No masses palpated. Extremities show no edema. neuro grossly intact  ECG Atrial fibrillation at a rate of 54. Normal axis. No ST changes.

## 2016-02-27 ENCOUNTER — Encounter: Payer: Self-pay | Admitting: Cardiology

## 2016-02-27 ENCOUNTER — Emergency Department
Admission: EM | Admit: 2016-02-27 | Discharge: 2016-02-27 | Disposition: A | Discharge status: Home / Self Care | Payer: Medicare Other | Source: Home / Self Care | Attending: Family Medicine | Admitting: Family Medicine

## 2016-02-27 ENCOUNTER — Ambulatory Visit (INDEPENDENT_AMBULATORY_CARE_PROVIDER_SITE_OTHER): Payer: Medicare Other | Admitting: Cardiology

## 2016-02-27 ENCOUNTER — Encounter: Payer: Self-pay | Admitting: *Deleted

## 2016-02-27 ENCOUNTER — Emergency Department (INDEPENDENT_AMBULATORY_CARE_PROVIDER_SITE_OTHER): Payer: Medicare Other

## 2016-02-27 VITALS — BP 132/74 | HR 54 | Ht 70.0 in | Wt 208.0 lb

## 2016-02-27 DIAGNOSIS — S52512A Displaced fracture of left radial styloid process, initial encounter for closed fracture: Secondary | ICD-10-CM | POA: Diagnosis not present

## 2016-02-27 DIAGNOSIS — E785 Hyperlipidemia, unspecified: Secondary | ICD-10-CM | POA: Diagnosis not present

## 2016-02-27 DIAGNOSIS — W1839XA Other fall on same level, initial encounter: Secondary | ICD-10-CM | POA: Diagnosis not present

## 2016-02-27 DIAGNOSIS — I4891 Unspecified atrial fibrillation: Secondary | ICD-10-CM | POA: Diagnosis not present

## 2016-02-27 DIAGNOSIS — W010XXA Fall on same level from slipping, tripping and stumbling without subsequent striking against object, initial encounter: Secondary | ICD-10-CM

## 2016-02-27 DIAGNOSIS — S52502A Unspecified fracture of the lower end of left radius, initial encounter for closed fracture: Secondary | ICD-10-CM

## 2016-02-27 DIAGNOSIS — X58XXXA Exposure to other specified factors, initial encounter: Secondary | ICD-10-CM | POA: Diagnosis not present

## 2016-02-27 MED ORDER — ATORVASTATIN CALCIUM 80 MG PO TABS: 80.0000 mg | ORAL_TABLET | Freq: Every day | ORAL | Status: DC

## 2016-02-27 NOTE — Discharge Instructions (Signed)
You may have acetaminophen (Tylenol for pain) Try to keep wrist elevated to help decreased pain and swelling.  Do NOT get the splint wet. See further instructions below.    Cast or Splint Care Casts and splints support injured limbs and keep bones from moving while they heal. It is important to care for your cast or splint at home.  HOME CARE INSTRUCTIONS  Keep the cast or splint uncovered during the drying period. It can take 24 to 48 hours to dry if it is made of plaster. A fiberglass cast will dry in less than 1 hour.  Do not rest the cast on anything harder than a pillow for the first 24 hours.  Do not put weight on your injured limb or apply pressure to the cast until your health care provider gives you permission.  Keep the cast or splint dry. Wet casts or splints can lose their shape and may not support the limb as well. A wet cast that has lost its shape can also create harmful pressure on your skin when it dries. Also, wet skin can become infected.  Cover the cast or splint with a plastic bag when bathing or when out in the rain or snow. If the cast is on the trunk of the body, take sponge baths until the cast is removed.  If your cast does become wet, dry it with a towel or a blow dryer on the cool setting only.  Keep your cast or splint clean. Soiled casts may be wiped with a moistened cloth.  Do not place any hard or soft foreign objects under your cast or splint, such as cotton, toilet paper, lotion, or powder.  Do not try to scratch the skin under the cast with any object. The object could get stuck inside the cast. Also, scratching could lead to an infection. If itching is a problem, use a blow dryer on a cool setting to relieve discomfort.  Do not trim or cut your cast or remove padding from inside of it.  Exercise all joints next to the injury that are not immobilized by the cast or splint. For example, if you have a long leg cast, exercise the hip joint and toes. If you  have an arm cast or splint, exercise the shoulder, elbow, thumb, and fingers.  Elevate your injured arm or leg on 1 or 2 pillows for the first 1 to 3 days to decrease swelling and pain.It is best if you can comfortably elevate your cast so it is higher than your heart. SEEK MEDICAL CARE IF:   Your cast or splint cracks.  Your cast or splint is too tight or too loose.  You have unbearable itching inside the cast.  Your cast becomes wet or develops a soft spot or area.  You have a bad smell coming from inside your cast.  You get an object stuck under your cast.  Your skin around the cast becomes red or raw.  You have new pain or worsening pain after the cast has been applied. SEEK IMMEDIATE MEDICAL CARE IF:   You have fluid leaking through the cast.  You are unable to move your fingers or toes.  You have discolored (blue or white), cool, painful, or very swollen fingers or toes beyond the cast.  You have tingling or numbness around the injured area.  You have severe pain or pressure under the cast.  You have any difficulty with your breathing or have shortness of breath.  You have  chest pain.   This information is not intended to replace advice given to you by your health care provider. Make sure you discuss any questions you have with your health care provider.   Document Released: 11/22/2000 Document Revised: 09/15/2013 Document Reviewed: 06/03/2013 Elsevier Interactive Patient Education [employer].

## 2016-02-27 NOTE — ED Notes (Signed)
Pt c/o LT wrist pain post fall at home this morning. He reports tripping. He denies any head injury or LOC.

## 2016-02-27 NOTE — Assessment & Plan Note (Signed)
Continue statin.No aspirin given need for anticoagulation. 

## 2016-02-27 NOTE — Patient Instructions (Signed)
Medication Instructions:   INCREASE ATORVASTATIN TO 80 MG ONCE DAILY  Labwork:  Your physician recommends that you return for lab work WITH DR PAZ= CBC, BMP, LIPID,HEPATIC  Follow-Up:  Your physician wants you to follow-up in: [place] will receive a reminder letter in the mail two months in advance. If you don't receive a letter, please call our office to schedule the follow-up appointment.   SAVAYSA OR ELIQUIS OR PRADAXA = SAME AS XARELTO

## 2016-02-27 NOTE — ED Provider Notes (Signed)
CSN: [id number]     Arrival date & time 02/27/16  [id number] History   First MD Initiated Contact with Patient 02/27/16 1913     Chief Complaint  Patient presents with  . Wrist Pain   (Consider location/radiation/quality/duration/timing/severity/associated sxs/prior Treatment) HPI  The pt is an 82yo male presenting to [facility] with c/o Left wrist pain, swelling and limited ROM after a trip and fall earlier this morning.  Denies hitting his head.  He did scrape his knee but states wrist pain is most concerning. Pain is aching and throbbing, worse with movement and palpation but only 3/10 in severity at rest.  Pt is Right hand dominant.   Past Medical History  Diagnosis Date  . CAD (coronary artery disease)     s/p CABG 1993 (L-LAD, S-RI, S-D1);   echo 1/10: EF 55%;    myoview 12/09: inf MI, no ischemia  . Hyperlipidemia   . HTN (hypertension)   . Folliculitis   . Obesity   . Osteoarthritis   . Irritable bladder   . Family history of malignant neoplasm of gastrointestinal tract   . Myocardial infarction ([place])   . Candida esophagitis ([place]) 2013    EGD   . Antral gastritis 2013    EGD   . Hiatal hernia   . Atrial fibrillation ([place])   . Anemia    Past Surgical History  Procedure Laterality Date  . Coronary artery bypass graft      graft '92: LIMA-LAD, SVG - D2,R1, D1  . Cholecystectomy      laparoscopic '92  . Mole removal  2001 and 2008  . Pilonidal cyst excision    . Enteroscopy  08/22/2012    Procedure: ENTEROSCOPY;  Surgeon: Juanita Craver, MD;  Location: [facility];  Service: Endoscopy;  Laterality: N/A;  . Knee surgery     Family History  Problem Relation Age of Onset  . Coronary artery disease Father     and brother  . Heart attack Father     and brother-fatal  . Stroke Father   . Breast cancer Mother   . Alzheimer's disease Mother   . Colon cancer Maternal Uncle   . Esophageal cancer Neg Hx   . Stomach cancer Neg Hx   . Rectal cancer Neg Hx   . Prostate cancer Neg Hx     Social History  Substance Use Topics  . Smoking status: Former Smoker -- 16 years    Quit date: 08/14/1964  . Smokeless tobacco: Never Used     Comment: quit 1965  . Alcohol Use: No    Review of Systems  Cardiovascular: Negative for chest pain and palpitations.  Musculoskeletal: Positive for myalgias, joint swelling and arthralgias. Negative for back pain, gait problem, neck pain and neck stiffness.  Skin: Positive for wound. Negative for color change.  Neurological: Positive for weakness ( Left wrist). Negative for numbness.    Allergies  Review of patient's allergies indicates no known allergies.  Home Medications   Prior to Admission medications   Medication Sig Start Date End Date Taking? Authorizing Provider  amLODipine (NORVASC) 2.5 MG tablet Take 1 tablet (2.5 mg total) by mouth daily. 02/19/16   Colon Branch, MD  atorvastatin (LIPITOR) 80 MG tablet Take 1 tablet (80 mg total) by mouth at bedtime. 02/27/16   Lelon Perla, MD  benazepril (LOTENSIN) 40 MG tablet Take 1 tablet (40 mg total) by mouth daily. 10/11/15   Colon Branch, MD  Cholecalciferol (VITAMIN D) 1000  UNITS capsule Take 1,000 Units by mouth daily.      Historical Provider, MD  Coenzyme Q10 (COQ10 PO) Take 1 capsule by mouth daily.    Historical Provider, MD  cyclobenzaprine (FLEXERIL) 5 MG tablet Take 1 at bedtime as needed for muscle relaxant. Caution: May cause drowsiness. Patient taking differently: Take 5 mg by mouth at bedtime as needed for muscle spasms. . Caution: May cause drowsiness. 06/16/14   Jacqulyn Cane, MD  diphenhydramine-acetaminophen (TYLENOL PM) 25-500 MG TABS tablet Take 2 tablets by mouth at bedtime.    Historical Provider, MD  HYDROcodone-acetaminophen (NORCO/VICODIN) 5-325 MG tablet Take 1 tablet by mouth every 6 (six) hours as needed for severe pain. 12/23/15   Carmin Muskrat, MD  Melatonin 5 MG TABS Take 5 mg by mouth at bedtime.    Historical Provider, MD  metoprolol succinate (TOPROL-XL)  25 MG 24 hr tablet Take 1 tablet (25 mg total) by mouth daily. 02/07/16   Colon Branch, MD  Multiple Vitamin (MULTIVITAMIN WITH MINERALS) TABS tablet Take 1 tablet by mouth daily.    Historical Provider, MD  nitroGLYCERIN (NITROSTAT) 0.4 MG SL tablet Place 1 tablet (0.4 mg total) under the tongue every 5 (five) minutes as needed for chest pain. 01/04/15   Lelon Perla, MD  pantoprazole (PROTONIX) 40 MG tablet Take 1 tablet (40 mg total) by mouth daily. 10/11/15   Colon Branch, MD  Probiotic Product (PROBIOTIC DAILY PO) Take 1 tablet by mouth daily.    Historical Provider, MD  rivaroxaban (XARELTO) 20 MG TABS tablet Take 1 tablet by mouth daily with supper.  Needs OV for further refills Patient taking differently: 20 mg daily with supper. Needs OV for further refills 11/14/15   Lelon Perla, MD  vitamin B-12 (CYANOCOBALAMIN) 1000 MCG tablet Take 1,000 mcg by mouth daily.    Historical Provider, MD   Meds Ordered and Administered this Visit  Medications - No data to display  BP 132/74 mmHg  Pulse 68  Temp(Src) 98.6 F (37 C) (Oral)  Resp 16  Ht 5' 10.5" (1.791 m)  Wt 207 lb (93.895 kg)  BMI 29.27 kg/m2  SpO2 97% No data found.   Physical Exam  Constitutional: He is oriented to person, place, and time. He appears well-developed and well-nourished.  HENT:  Head: Normocephalic and atraumatic.  Eyes: EOM are normal.  Neck: Normal range of motion.  Cardiovascular: Normal rate.   Pulses:      Radial pulses are 2+ on the left side.  Left hand: cap refill < 3 seconds  Pulmonary/Chest: Effort normal.  Musculoskeletal: He exhibits edema and tenderness.  Left wrist: moderate edema to radial aspect. Tender to touch. Limited ROM due to pain. 4/5 grip strength.  Left knee: full ROM, mild tenderness to anterior knee.   Neurological: He is alert and oriented to person, place, and time.  Skin: Skin is warm and dry.  Left hand and wrist: skin in tact. No ecchymosis or erythema. Left knee:  superficial abrasion. No active bleeding.   Psychiatric: He has a normal mood and affect. His behavior is normal.  Nursing note and vitals reviewed.   ED Course  Procedures (including critical care time)  Labs Review Labs Reviewed - No data to display  Imaging Review Dg Wrist Complete Left  02/27/2016  CLINICAL DATA:  Fall this morning. Left wrist pain and swelling. Initial encounter. EXAM: LEFT WRIST - COMPLETE 3+ VIEW COMPARISON:  Left hand radiographs 02/14/2015 FINDINGS: There is an  oblique, minimally displaced, intra-articular fracture of the distal radius extending through the base of the radial styloid. The distal ulna appears intact. There is no dislocation. Advanced degenerative changes are again seen at the first [facility] joint. Diffuse soft tissue swelling is present about the wrist. IMPRESSION: Minimally displaced fracture through the radial styloid. Electronically Signed   By: Logan Bores M.D.   On: 02/27/2016 19:31      MDM   1. Radius distal fracture, left, closed, initial encounter   2. Fall from slip, trip, or stumble, initial encounter    Pt presenting to [facility] with Left wrist pain and swelling after mechanical fall this morning. No other injuries. Denies head injury.  Plain films: closed, minimally displaced fracture through the radial styloid  Customized thumb-spica splint applied.  Home care instructions provided. Encouraged to call Sports Medicine to schedule f/u appointment.  Pt declined pain medication. Pain 3/10 at rest before splinting.  Pt may have acetaminophen for pain. Encouraged to keep elevated. Patient verbalized understanding and agreement with treatment plan.     Noland Fordyce, PA-C 02/28/16 1154

## 2016-02-27 NOTE — Assessment & Plan Note (Signed)
Given documented coronary disease I will increase his Lipitor to 80 mg daily. Check lipids and liver in 4 weeks.

## 2016-02-27 NOTE — Assessment & Plan Note (Signed)
Blood pressure controlled. Continue present medications. We will check potassium and renal function in early May with primary care.

## 2016-02-27 NOTE — Assessment & Plan Note (Signed)
Patient remains in atrial fibrillation. Continue Toprol for rate control. Continue xarelto. Check hemoglobin and renal function when he sees primary care in early May.

## 2016-02-28 ENCOUNTER — Telehealth: Payer: Self-pay | Admitting: Cardiology

## 2016-02-28 ENCOUNTER — Ambulatory Visit (INDEPENDENT_AMBULATORY_CARE_PROVIDER_SITE_OTHER): Payer: Medicare Other | Admitting: Family Medicine

## 2016-02-28 ENCOUNTER — Encounter: Payer: Self-pay | Admitting: Family Medicine

## 2016-02-28 VITALS — BP 128/75 | HR 80 | Wt 208.0 lb

## 2016-02-28 DIAGNOSIS — S52511A Displaced fracture of right radial styloid process, initial encounter for closed fracture: Secondary | ICD-10-CM | POA: Diagnosis not present

## 2016-02-28 DIAGNOSIS — Z1321 Encounter for screening for nutritional disorder: Secondary | ICD-10-CM | POA: Diagnosis not present

## 2016-02-28 DIAGNOSIS — S52513A Displaced fracture of unspecified radial styloid process, initial encounter for closed fracture: Secondary | ICD-10-CM | POA: Insufficient documentation

## 2016-02-28 MED ORDER — ATORVASTATIN CALCIUM 80 MG PO TABS: 40.0000 mg | ORAL_TABLET | Freq: Every day | ORAL | Status: DC

## 2016-02-28 NOTE — Patient Instructions (Signed)
Thank you for coming in today. Get labs today.  Return in 1 weeks for fracture recheck and cast application.   Cast or Splint Care Casts and splints support injured limbs and keep bones from moving while they heal. It is important to care for your cast or splint at home.  HOME CARE INSTRUCTIONS  Keep the cast or splint uncovered during the drying period. It can take 24 to 48 hours to dry if it is made of plaster. A fiberglass cast will dry in less than 1 hour.  Do not rest the cast on anything harder than a pillow for the first 24 hours.  Do not put weight on your injured limb or apply pressure to the cast until your health care provider gives you permission.  Keep the cast or splint dry. Wet casts or splints can lose their shape and may not support the limb as well. A wet cast that has lost its shape can also create harmful pressure on your skin when it dries. Also, wet skin can become infected.  Cover the cast or splint with a plastic bag when bathing or when out in the rain or snow. If the cast is on the trunk of the body, take sponge baths until the cast is removed.  If your cast does become wet, dry it with a towel or a blow dryer on the cool setting only.  Keep your cast or splint clean. Soiled casts may be wiped with a moistened cloth.  Do not place any hard or soft foreign objects under your cast or splint, such as cotton, toilet paper, lotion, or powder.  Do not try to scratch the skin under the cast with any object. The object could get stuck inside the cast. Also, scratching could lead to an infection. If itching is a problem, use a blow dryer on a cool setting to relieve discomfort.  Do not trim or cut your cast or remove padding from inside of it.  Exercise all joints next to the injury that are not immobilized by the cast or splint. For example, if you have a long leg cast, exercise the hip joint and toes. If you have an arm cast or splint, exercise the shoulder, elbow,  thumb, and fingers.  Elevate your injured arm or leg on 1 or 2 pillows for the first 1 to 3 days to decrease swelling and pain.It is best if you can comfortably elevate your cast so it is higher than your heart. SEEK MEDICAL CARE IF:   Your cast or splint cracks.  Your cast or splint is too tight or too loose.  You have unbearable itching inside the cast.  Your cast becomes wet or develops a soft spot or area.  You have a bad smell coming from inside your cast.  You get an object stuck under your cast.  Your skin around the cast becomes red or raw.  You have new pain or worsening pain after the cast has been applied. SEEK IMMEDIATE MEDICAL CARE IF:   You have fluid leaking through the cast.  You are unable to move your fingers or toes.  You have discolored (blue or white), cool, painful, or very swollen fingers or toes beyond the cast.  You have tingling or numbness around the injured area.  You have severe pain or pressure under the cast.  You have any difficulty with your breathing or have shortness of breath.  You have chest pain.   This information is not intended to  replace advice given to you by your health care provider. Make sure you discuss any questions you have with your health care provider.   Document Released: 11/22/2000 Document Revised: 09/15/2013 Document Reviewed: 06/03/2013 Elsevier Interactive Patient Education [employer].

## 2016-02-28 NOTE — Telephone Encounter (Signed)
Spoke with pt, he has concerns about the big increase in the lipitor. Okay given for pt to cut the 80 mg atorvastatin in 1/2 and to start with 40 mg. Pt agreed with this plan.

## 2016-02-28 NOTE — Progress Notes (Signed)
   Subjective:    I'm seeing this patient as a consultation for:  Noland Fordyce PA-C.  CC. Kathlene November, MD   CC: Left wrist injury  HPI:  Mr Maisonet tripped and fell yesterday landing on his outstretched left wrist. He Was seen in urgent care where he was diagnosed with a radial styloid fracture. He was placed into a thumb spica splint. He presents to sports medicine clinic today for evaluation. He feels well and notes minimal pain.  Past medical history, Surgical history, Family history not pertinant except as noted below, Social history, Allergies, and medications have been entered into the medical record, reviewed, and no changes needed.   Review of Systems: No headache, visual changes, nausea, vomiting, diarrhea, constipation, dizziness, abdominal pain, skin rash, fevers, chills, night sweats, weight loss, swollen lymph nodes, body aches, joint swelling, muscle aches, chest pain, shortness of breath, mood changes, visual or auditory hallucinations.   Objective:    Filed Vitals:   02/28/16 1502  BP: 128/75  Pulse: 80   General: Well Developed, well nourished, and in no acute distress.  Neuro/Psych: Alert and oriented x3, extra-ocular muscles intact, able to move all 4 extremities, sensation grossly intact. Skin: Warm and dry, no rashes noted.  Respiratory: Not using accessory muscles, speaking in full sentences, trachea midline.  Cardiovascular: Pulses palpable, no extremity edema. Abdomen: Does not appear distended. MSK: Left hand well-appearing. Splint was not removed.  capillary refill sensation of finger motion are intact.  No results found for this or any previous visit (from the past 24 hour(s)). Dg Wrist Complete Left  02/27/2016  CLINICAL DATA:  Fall this morning. Left wrist pain and swelling. Initial encounter. EXAM: LEFT WRIST - COMPLETE 3+ VIEW COMPARISON:  Left hand radiographs 02/14/2015 FINDINGS: There is an oblique, minimally displaced, intra-articular fracture of the  distal radius extending through the base of the radial styloid. The distal ulna appears intact. There is no dislocation. Advanced degenerative changes are again seen at the first [facility] joint. Diffuse soft tissue swelling is present about the wrist. IMPRESSION: Minimally displaced fracture through the radial styloid. Electronically Signed   By: Logan Bores M.D.   On: 02/27/2016 19:31    Impression and Recommendations:   This case required medical decision making of moderate complexity.

## 2016-02-28 NOTE — Telephone Encounter (Signed)
[name] is calling because he thinks that the 80 mg Atorvastatin is too big of a jump from 10mg  . Would like to speak to someone about it . Please call   Thanks

## 2016-02-28 NOTE — Assessment & Plan Note (Signed)
Doing well. Too early for rigid casting.  Plan for return in one week for repeat x-ray and Exos cast application.

## 2016-02-28 NOTE — Telephone Encounter (Signed)
Left message for pt to call.

## 2016-02-29 ENCOUNTER — Ambulatory Visit (INDEPENDENT_AMBULATORY_CARE_PROVIDER_SITE_OTHER): Payer: Medicare Other | Admitting: Family Medicine

## 2016-02-29 ENCOUNTER — Encounter: Payer: Self-pay | Admitting: Family Medicine

## 2016-02-29 VITALS — BP 145/76 | HR 83

## 2016-02-29 DIAGNOSIS — S52511A Displaced fracture of right radial styloid process, initial encounter for closed fracture: Secondary | ICD-10-CM

## 2016-02-29 LAB — COMPREHENSIVE METABOLIC PANEL
ALT: 17 U/L (ref 9–46)
AST: 21 U/L (ref 10–35)
Albumin: 4.1 g/dL (ref 3.6–5.1)
Alkaline Phosphatase: 58 U/L (ref 40–115)
BILIRUBIN TOTAL: 0.6 mg/dL (ref 0.2–1.2)
BUN: 23 mg/dL (ref 7–25)
CO2: 26 mmol/L (ref 20–31)
CREATININE: 1.17 mg/dL — AB (ref 0.70–1.11)
Calcium: 9.1 mg/dL (ref 8.6–10.3)
Chloride: 103 mmol/L (ref 98–110)
GLUCOSE: 125 mg/dL — AB (ref 65–99)
Potassium: 4.1 mmol/L (ref 3.5–5.3)
SODIUM: 139 mmol/L (ref 135–146)
Total Protein: 6.8 g/dL (ref 6.1–8.1)

## 2016-02-29 LAB — VITAMIN D 25 HYDROXY (VIT D DEFICIENCY, FRACTURES): Vit D, 25-Hydroxy: 48 ng/mL (ref 30–100)

## 2016-02-29 NOTE — Assessment & Plan Note (Signed)
Splint adjusted. Return next week for Exos casting.

## 2016-02-29 NOTE — Patient Instructions (Signed)
Thank you for coming in today. Return next Tuesday.  Let me know if you are having any splint problems.

## 2016-02-29 NOTE — Progress Notes (Signed)
[name] is a 82 y.o. male who presents to [facility]: Primary Care today for follow-up hand swelling. Patient was diagnosed with a radial styloid fracture earlier this week. He had a splint placed. He presented to clinic today with hand swelling. He denies any significant pain. He thinks the Ace wrap is too tight with the thumb spica splint.   Past Medical History  Diagnosis Date  . CAD (coronary artery disease)     s/p CABG 1993 (L-LAD, S-RI, S-D1);   echo 1/10: EF 55%;    myoview 12/09: inf MI, no ischemia  . Hyperlipidemia   . HTN (hypertension)   . Folliculitis   . Obesity   . Osteoarthritis   . Irritable bladder   . Family history of malignant neoplasm of gastrointestinal tract   . Myocardial infarction ([place])   . Candida esophagitis ([place]) 2013    EGD   . Antral gastritis 2013    EGD   . Hiatal hernia   . Atrial fibrillation ([place])   . Anemia    Past Surgical History  Procedure Laterality Date  . Coronary artery bypass graft      graft '92: LIMA-LAD, SVG - D2,R1, D1  . Cholecystectomy      laparoscopic '92  . Mole removal  2001 and 2008  . Pilonidal cyst excision    . Enteroscopy  08/22/2012    Procedure: ENTEROSCOPY;  Surgeon: Juanita Craver, MD;  Location: [facility];  Service: Endoscopy;  Laterality: N/A;  . Knee surgery     Social History  Substance Use Topics  . Smoking status: Former Smoker -- 16 years    Quit date: 08/14/1964  . Smokeless tobacco: Never Used     Comment: quit 1965  . Alcohol Use: No   family history includes Alzheimer's disease in his mother; Breast cancer in his mother; Colon cancer in his maternal uncle; Coronary artery disease in his father; Heart attack in his father; Stroke in his father. There is no history of Esophageal cancer, Stomach cancer, Rectal cancer, or Prostate cancer.  ROS as above Medications: Current Outpatient Prescriptions    Medication Sig Dispense Refill  . amLODipine (NORVASC) 2.5 MG tablet Take 1 tablet (2.5 mg total) by mouth daily. 90 tablet 0  . atorvastatin (LIPITOR) 80 MG tablet Take 0.5 tablets (40 mg total) by mouth at bedtime. 90 tablet 3  . benazepril (LOTENSIN) 40 MG tablet Take 1 tablet (40 mg total) by mouth daily. 90 tablet 1  . Cholecalciferol (VITAMIN D) 1000 UNITS capsule Take 1,000 Units by mouth daily.      . Coenzyme Q10 (COQ10 PO) Take 1 capsule by mouth daily.    . cyclobenzaprine (FLEXERIL) 5 MG tablet Take 1 at bedtime as needed for muscle relaxant. Caution: May cause drowsiness. (Patient taking differently: Take 5 mg by mouth at bedtime as needed for muscle spasms. . Caution: May cause drowsiness.) 10 tablet 0  . diphenhydramine-acetaminophen (TYLENOL PM) 25-500 MG TABS tablet Take 2 tablets by mouth at bedtime.    Marland Kitchen HYDROcodone-acetaminophen (NORCO/VICODIN) 5-325 MG tablet Take 1 tablet by mouth every 6 (six) hours as needed for severe pain. 10 tablet 0  . Melatonin 5 MG TABS Take 5 mg by mouth at bedtime.    . metoprolol succinate (TOPROL-XL) 25 MG 24 hr tablet Take 1 tablet (25 mg total) by mouth daily. 30 tablet 3  . Multiple Vitamin (MULTIVITAMIN WITH MINERALS) TABS tablet Take 1 tablet  by mouth daily.    . nitroGLYCERIN (NITROSTAT) 0.4 MG SL tablet Place 1 tablet (0.4 mg total) under the tongue every 5 (five) minutes as needed for chest pain. 25 tablet 3  . pantoprazole (PROTONIX) 40 MG tablet Take 1 tablet (40 mg total) by mouth daily. 90 tablet 1  . Probiotic Product (PROBIOTIC DAILY PO) Take 1 tablet by mouth daily.    . rivaroxaban (XARELTO) 20 MG TABS tablet Take 1 tablet by mouth daily with supper.  Needs OV for further refills (Patient taking differently: 20 mg daily with supper. Needs OV for further refills) 90 tablet 0  . vitamin B-12 (CYANOCOBALAMIN) 1000 MCG tablet Take 1,000 mcg by mouth daily.     No current facility-administered medications for this visit.   No Known  Allergies   Exam:  BP 145/76 mmHg  Pulse 83 Gen: Well NAD Left hand swollen just distal to the Ace wrap. Nontender normal capillary refill and motion.  Ace wrap removed and reapplied more loosely. Patient felt much better.  Results for orders placed or performed in visit on 02/28/16 (from the past 24 hour(s))  VITAMIN D 25 Hydroxy (Vit-D Deficiency, Fractures)     Status: None   Collection Time: 02/28/16  3:32 PM  Result Value Ref Range   Vit D, 25-Hydroxy 48 30 - 100 ng/mL   Narrative   Performed at:  [facility], [id number]                [place], [place] [address]  Comprehensive metabolic panel     Status: Abnormal   Collection Time: 02/28/16  3:32 PM  Result Value Ref Range   Sodium 139 135 - 146 mmol/L   Potassium 4.1 3.5 - 5.3 mmol/L   Chloride 103 98 - 110 mmol/L   CO2 26 20 - 31 mmol/L   Glucose, Bld 125 (H) 65 - 99 mg/dL   BUN 23 7 - 25 mg/dL   Creat 1.17 (H) 0.70 - 1.11 mg/dL   Total Bilirubin 0.6 0.2 - 1.2 mg/dL   Alkaline Phosphatase 58 40 - 115 U/L   AST 21 10 - 35 U/L   ALT 17 9 - 46 U/L   Total Protein 6.8 6.1 - 8.1 g/dL   Albumin 4.1 3.6 - 5.1 g/dL   Calcium 9.1 8.6 - 10.3 mg/dL   Narrative   Performed at:  [facility], [id number]                [place], [place] [address] HAS THE PATIENT FASTED?->NO; SOURCE: BLD&BLOOD; HAS THE PATI   Dg Wrist Complete Left  02/27/2016  CLINICAL DATA:  Fall this morning. Left wrist pain and swelling. Initial encounter. EXAM: LEFT WRIST - COMPLETE 3+ VIEW COMPARISON:  Left hand radiographs 02/14/2015 FINDINGS: There is an oblique, minimally displaced, intra-articular fracture of the distal radius extending through the base of the radial styloid. The distal ulna appears intact. There is no dislocation. Advanced degenerative changes are again seen at the first [facility] joint. Diffuse soft tissue swelling is present about the wrist. IMPRESSION: Minimally  displaced fracture through the radial styloid. Electronically Signed   By: Logan Bores M.D.   On: 02/27/2016 19:31     Please see individual assessment and plan sections.

## 2016-03-05 ENCOUNTER — Ambulatory Visit (INDEPENDENT_AMBULATORY_CARE_PROVIDER_SITE_OTHER): Payer: Medicare Other

## 2016-03-05 ENCOUNTER — Other Ambulatory Visit: Payer: Self-pay | Admitting: Family Medicine

## 2016-03-05 ENCOUNTER — Ambulatory Visit (INDEPENDENT_AMBULATORY_CARE_PROVIDER_SITE_OTHER): Payer: Medicare Other | Admitting: Family Medicine

## 2016-03-05 ENCOUNTER — Encounter: Payer: Self-pay | Admitting: Family Medicine

## 2016-03-05 VITALS — BP 137/76 | HR 83 | Wt 209.0 lb

## 2016-03-05 DIAGNOSIS — X58XXXD Exposure to other specified factors, subsequent encounter: Secondary | ICD-10-CM

## 2016-03-05 DIAGNOSIS — S52512D Displaced fracture of left radial styloid process, subsequent encounter for closed fracture with routine healing: Secondary | ICD-10-CM | POA: Diagnosis not present

## 2016-03-05 DIAGNOSIS — S62102A Fracture of unspecified carpal bone, left wrist, initial encounter for closed fracture: Secondary | ICD-10-CM | POA: Diagnosis not present

## 2016-03-05 DIAGNOSIS — S52511A Displaced fracture of right radial styloid process, initial encounter for closed fracture: Secondary | ICD-10-CM

## 2016-03-05 DIAGNOSIS — S52512A Displaced fracture of left radial styloid process, initial encounter for closed fracture: Secondary | ICD-10-CM

## 2016-03-05 NOTE — Assessment & Plan Note (Addendum)
Left radial styloid fracture. Doing well. Exos cast applied. Recheck in 2 weeks

## 2016-03-05 NOTE — Progress Notes (Signed)
Quick Note:  Fracture is stable ______

## 2016-03-05 NOTE — Patient Instructions (Signed)
Thank you for coming in today. Return in 2 weeks or so.

## 2016-03-05 NOTE — Progress Notes (Signed)
[name] is a 82 y.o. male who presents to [facility]: Primary Care today for follow-up  Left wrist radial styloid fracture.  Patient was seen last week for a nondisplaced radial styloid fracture. He has been in a thumb spica splint and feels well.   Past Medical History  Diagnosis Date  . CAD (coronary artery disease)     s/p CABG 1993 (L-LAD, S-RI, S-D1);   echo 1/10: EF 55%;    myoview 12/09: inf MI, no ischemia  . Hyperlipidemia   . HTN (hypertension)   . Folliculitis   . Obesity   . Osteoarthritis   . Irritable bladder   . Family history of malignant neoplasm of gastrointestinal tract   . Myocardial infarction ([place])   . Candida esophagitis ([place]) 2013    EGD   . Antral gastritis 2013    EGD   . Hiatal hernia   . Atrial fibrillation ([place])   . Anemia    Past Surgical History  Procedure Laterality Date  . Coronary artery bypass graft      graft '92: LIMA-LAD, SVG - D2,R1, D1  . Cholecystectomy      laparoscopic '92  . Mole removal  2001 and 2008  . Pilonidal cyst excision    . Enteroscopy  08/22/2012    Procedure: ENTEROSCOPY;  Surgeon: Juanita Craver, MD;  Location: [facility];  Service: Endoscopy;  Laterality: N/A;  . Knee surgery     Social History  Substance Use Topics  . Smoking status: Former Smoker -- 16 years    Quit date: 08/14/1964  . Smokeless tobacco: Never Used     Comment: quit 1965  . Alcohol Use: No   family history includes Alzheimer's disease in his mother; Breast cancer in his mother; Colon cancer in his maternal uncle; Coronary artery disease in his father; Heart attack in his father; Stroke in his father. There is no history of Esophageal cancer, Stomach cancer, Rectal cancer, or Prostate cancer.  ROS as above Medications: Current Outpatient Prescriptions  Medication Sig Dispense Refill  . amLODipine (NORVASC) 2.5 MG tablet Take 1 tablet (2.5 mg  total) by mouth daily. 90 tablet 0  . atorvastatin (LIPITOR) 80 MG tablet Take 0.5 tablets (40 mg total) by mouth at bedtime. 90 tablet 3  . benazepril (LOTENSIN) 40 MG tablet Take 1 tablet (40 mg total) by mouth daily. 90 tablet 1  . Cholecalciferol (VITAMIN D) 1000 UNITS capsule Take 1,000 Units by mouth daily.      . Coenzyme Q10 (COQ10 PO) Take 1 capsule by mouth daily.    . cyclobenzaprine (FLEXERIL) 5 MG tablet Take 1 at bedtime as needed for muscle relaxant. Caution: May cause drowsiness. (Patient taking differently: Take 5 mg by mouth at bedtime as needed for muscle spasms. . Caution: May cause drowsiness.) 10 tablet 0  . diphenhydramine-acetaminophen (TYLENOL PM) 25-500 MG TABS tablet Take 2 tablets by mouth at bedtime.    Marland Kitchen HYDROcodone-acetaminophen (NORCO/VICODIN) 5-325 MG tablet Take 1 tablet by mouth every 6 (six) hours as needed for severe pain. 10 tablet 0  . Melatonin 5 MG TABS Take 5 mg by mouth at bedtime.    . metoprolol succinate (TOPROL-XL) 25 MG 24 hr tablet Take 1 tablet (25 mg total) by mouth daily. 30 tablet 3  . Multiple Vitamin (MULTIVITAMIN WITH MINERALS) TABS tablet Take 1 tablet by mouth daily.    . nitroGLYCERIN (NITROSTAT) 0.4 MG SL tablet Place 1 tablet (  0.4 mg total) under the tongue every 5 (five) minutes as needed for chest pain. 25 tablet 3  . pantoprazole (PROTONIX) 40 MG tablet Take 1 tablet (40 mg total) by mouth daily. 90 tablet 1  . Probiotic Product (PROBIOTIC DAILY PO) Take 1 tablet by mouth daily.    . rivaroxaban (XARELTO) 20 MG TABS tablet Take 1 tablet by mouth daily with supper.  Needs OV for further refills (Patient taking differently: 20 mg daily with supper. Needs OV for further refills) 90 tablet 0  . vitamin B-12 (CYANOCOBALAMIN) 1000 MCG tablet Take 1,000 mcg by mouth daily.     No current facility-administered medications for this visit.   No Known Allergies   Exam:  BP 137/76 mmHg  Pulse 83  Wt 209 lb (94.802 kg) Gen: Well NAD Left   wrist with volar ecchymosis but otherwise  appearing and nontender. Pulses capillary refill and sensation are intact.    Left wrist x-ray well-appearing nondisplaced radial styloid fracture.   No results found for this or any previous visit (from the past 24 hour(s)). No results found.   Please see individual assessment and plan sections.

## 2016-03-06 ENCOUNTER — Ambulatory Visit: Payer: Medicare Other | Admitting: Family Medicine

## 2016-03-20 ENCOUNTER — Encounter: Payer: Self-pay | Admitting: Family Medicine

## 2016-03-20 ENCOUNTER — Ambulatory Visit (INDEPENDENT_AMBULATORY_CARE_PROVIDER_SITE_OTHER): Payer: Medicare Other

## 2016-03-20 ENCOUNTER — Ambulatory Visit (INDEPENDENT_AMBULATORY_CARE_PROVIDER_SITE_OTHER): Payer: Medicare Other | Admitting: Family Medicine

## 2016-03-20 VITALS — BP 146/81 | HR 65 | Wt 207.0 lb

## 2016-03-20 DIAGNOSIS — S52512A Displaced fracture of left radial styloid process, initial encounter for closed fracture: Secondary | ICD-10-CM

## 2016-03-20 DIAGNOSIS — X58XXXD Exposure to other specified factors, subsequent encounter: Secondary | ICD-10-CM | POA: Diagnosis not present

## 2016-03-20 DIAGNOSIS — S52512D Displaced fracture of left radial styloid process, subsequent encounter for closed fracture with routine healing: Secondary | ICD-10-CM | POA: Diagnosis not present

## 2016-03-20 NOTE — Progress Notes (Signed)
[name] is a 82 y.o. male who presents to [facility] today for follow-up left wrist fracture. Patient was originally seen on March 31 for fracture of the left radial styloid. He's been treated with immobilization since. He feels fine with minimal pain.   Past Medical History  Diagnosis Date  . CAD (coronary artery disease)     s/p CABG 1993 (L-LAD, S-RI, S-D1);   echo 1/10: EF 55%;    myoview 12/09: inf MI, no ischemia  . Hyperlipidemia   . HTN (hypertension)   . Folliculitis   . Obesity   . Osteoarthritis   . Irritable bladder   . Family history of malignant neoplasm of gastrointestinal tract   . Myocardial infarction ([place])   . Candida esophagitis ([place]) 2013    EGD   . Antral gastritis 2013    EGD   . Hiatal hernia   . Atrial fibrillation ([place])   . Anemia    Past Surgical History  Procedure Laterality Date  . Coronary artery bypass graft      graft '92: LIMA-LAD, SVG - D2,R1, D1  . Cholecystectomy      laparoscopic '92  . Mole removal  2001 and 2008  . Pilonidal cyst excision    . Enteroscopy  08/22/2012    Procedure: ENTEROSCOPY;  Surgeon: Juanita Craver, MD;  Location: [facility];  Service: Endoscopy;  Laterality: N/A;  . Knee surgery     Social History  Substance Use Topics  . Smoking status: Former Smoker -- 16 years    Quit date: 08/14/1964  . Smokeless tobacco: Never Used     Comment: quit 1965  . Alcohol Use: No   family history includes Alzheimer's disease in his mother; Breast cancer in his mother; Colon cancer in his maternal uncle; Coronary artery disease in his father; Heart attack in his father; Stroke in his father. There is no history of Esophageal cancer, Stomach cancer, Rectal cancer, or Prostate cancer.  ROS:  No headache, visual changes, nausea, vomiting, diarrhea, constipation, dizziness, abdominal pain, skin rash, fevers, chills, night sweats, weight loss, swollen lymph nodes, body aches, joint  swelling, muscle aches, chest pain, shortness of breath, mood changes, visual or auditory hallucinations.    Medications: Current Outpatient Prescriptions  Medication Sig Dispense Refill  . amLODipine (NORVASC) 2.5 MG tablet Take 1 tablet (2.5 mg total) by mouth daily. 90 tablet 0  . atorvastatin (LIPITOR) 80 MG tablet Take 0.5 tablets (40 mg total) by mouth at bedtime. 90 tablet 3  . benazepril (LOTENSIN) 40 MG tablet Take 1 tablet (40 mg total) by mouth daily. 90 tablet 1  . Cholecalciferol (VITAMIN D) 1000 UNITS capsule Take 1,000 Units by mouth daily.      . Coenzyme Q10 (COQ10 PO) Take 1 capsule by mouth daily.    . cyclobenzaprine (FLEXERIL) 5 MG tablet Take 1 at bedtime as needed for muscle relaxant. Caution: May cause drowsiness. (Patient taking differently: Take 5 mg by mouth at bedtime as needed for muscle spasms. . Caution: May cause drowsiness.) 10 tablet 0  . diphenhydramine-acetaminophen (TYLENOL PM) 25-500 MG TABS tablet Take 2 tablets by mouth at bedtime.    Marland Kitchen HYDROcodone-acetaminophen (NORCO/VICODIN) 5-325 MG tablet Take 1 tablet by mouth every 6 (six) hours as needed for severe pain. 10 tablet 0  . Melatonin 5 MG TABS Take 5 mg by mouth at bedtime.    . metoprolol succinate (TOPROL-XL) 25 MG 24 hr tablet Take 1 tablet (  25 mg total) by mouth daily. 30 tablet 3  . Multiple Vitamin (MULTIVITAMIN WITH MINERALS) TABS tablet Take 1 tablet by mouth daily.    . nitroGLYCERIN (NITROSTAT) 0.4 MG SL tablet Place 1 tablet (0.4 mg total) under the tongue every 5 (five) minutes as needed for chest pain. 25 tablet 3  . pantoprazole (PROTONIX) 40 MG tablet Take 1 tablet (40 mg total) by mouth daily. 90 tablet 1  . Probiotic Product (PROBIOTIC DAILY PO) Take 1 tablet by mouth daily.    . rivaroxaban (XARELTO) 20 MG TABS tablet Take 1 tablet by mouth daily with supper.  Needs OV for further refills (Patient taking differently: 20 mg daily with supper. Needs OV for further refills) 90 tablet 0  .  vitamin B-12 (CYANOCOBALAMIN) 1000 MCG tablet Take 1,000 mcg by mouth daily.     No current facility-administered medications for this visit.   No Known Allergies   Exam:  BP 146/81 mmHg  Pulse 65  Wt 207 lb (93.895 kg)  General: Well Developed, well nourished, and in no acute distress.   MSK: Hand is well appearing. Normal motion, sensation and cap refill.   Xray left wrist: Continue mild displaced radial styloid fracture. Minimal healing. No further displacement. Awaiting formal xray read.    No results found for this or any previous visit (from the past 24 hour(s)). No results found.   82 yo male with radial styloid fracture.  Doing well.  Continue thumb spica exos cast.

## 2016-03-20 NOTE — Progress Notes (Signed)
Quick Note:  Xray is stable. ______

## 2016-03-20 NOTE — Patient Instructions (Signed)
Thank you for coming in today. Continue the cast.  Recheck in 2 weeks or so.

## 2016-03-25 ENCOUNTER — Other Ambulatory Visit: Payer: Self-pay | Admitting: Pharmacist Clinician (PhC)/ Clinical Pharmacy Specialist

## 2016-03-25 MED ORDER — RIVAROXABAN 20 MG PO TABS: 20.0000 mg | ORAL_TABLET | Freq: Every day | ORAL | Status: DC

## 2016-03-29 ENCOUNTER — Telehealth: Payer: Self-pay | Admitting: Internal Medicine

## 2016-03-29 ENCOUNTER — Telehealth: Payer: Self-pay | Admitting: *Deleted

## 2016-03-29 NOTE — Telephone Encounter (Signed)
Error/gd °

## 2016-03-29 NOTE — Telephone Encounter (Signed)
Medication Samples have been provided to the patient.  Drug name: Xarelto       Strength: 20mg         Qty: 5 bottles (35pills) LOT: [id number]  Exp.Date: 08/19  The patient has been instructed regarding the correct time, dose, and frequency of taking this medication, including desired effects and most common side effects.   Roney Jaffe 11:02 AM 03/29/2016

## 2016-04-01 DIAGNOSIS — L438 Other lichen planus: Secondary | ICD-10-CM | POA: Diagnosis not present

## 2016-04-01 DIAGNOSIS — D485 Neoplasm of uncertain behavior of skin: Secondary | ICD-10-CM | POA: Diagnosis not present

## 2016-04-01 DIAGNOSIS — L821 Other seborrheic keratosis: Secondary | ICD-10-CM | POA: Diagnosis not present

## 2016-04-01 DIAGNOSIS — C44622 Squamous cell carcinoma of skin of right upper limb, including shoulder: Secondary | ICD-10-CM | POA: Diagnosis not present

## 2016-04-03 ENCOUNTER — Ambulatory Visit (INDEPENDENT_AMBULATORY_CARE_PROVIDER_SITE_OTHER): Payer: Medicare Other

## 2016-04-03 ENCOUNTER — Encounter: Payer: Self-pay | Admitting: Family Medicine

## 2016-04-03 ENCOUNTER — Ambulatory Visit (INDEPENDENT_AMBULATORY_CARE_PROVIDER_SITE_OTHER): Payer: Medicare Other | Admitting: Family Medicine

## 2016-04-03 VITALS — BP 144/76 | HR 82 | Wt 208.0 lb

## 2016-04-03 DIAGNOSIS — S52512A Displaced fracture of left radial styloid process, initial encounter for closed fracture: Secondary | ICD-10-CM

## 2016-04-03 DIAGNOSIS — S52512D Displaced fracture of left radial styloid process, subsequent encounter for closed fracture with routine healing: Secondary | ICD-10-CM

## 2016-04-03 NOTE — Patient Instructions (Signed)
Thank you for coming in today. Return in 2-3 weeks.  Continue the cast

## 2016-04-03 NOTE — Progress Notes (Signed)
[name] is a 82 y.o. male who presents to [facility]: Primary Care today for follow-up left wrist fracture. Patient has been seen since March 21 for fracture of the distal radius. He is been immobilized in a thumb spica cast. He feels well and denies significant pain.   Past Medical History  Diagnosis Date  . CAD (coronary artery disease)     s/p CABG 1993 (L-LAD, S-RI, S-D1);   echo 1/10: EF 55%;    myoview 12/09: inf MI, no ischemia  . Hyperlipidemia   . HTN (hypertension)   . Folliculitis   . Obesity   . Osteoarthritis   . Irritable bladder   . Family history of malignant neoplasm of gastrointestinal tract   . Myocardial infarction ([place])   . Candida esophagitis ([place]) 2013    EGD   . Antral gastritis 2013    EGD   . Hiatal hernia   . Atrial fibrillation ([place])   . Anemia    Past Surgical History  Procedure Laterality Date  . Coronary artery bypass graft      graft '92: LIMA-LAD, SVG - D2,R1, D1  . Cholecystectomy      laparoscopic '92  . Mole removal  2001 and 2008  . Pilonidal cyst excision    . Enteroscopy  08/22/2012    Procedure: ENTEROSCOPY;  Surgeon: Juanita Craver, MD;  [facility]: [facility];  Service: Endoscopy;  Laterality: N/A;  . Knee surgery     Social History  Substance Use Topics  . Smoking status: Former Smoker -- 16 years    Quit date: 08/14/1964  . Smokeless tobacco: Never Used     Comment: quit 1965  . Alcohol Use: No   family history includes Alzheimer's disease in his mother; Breast cancer in his mother; Colon cancer in his maternal uncle; Coronary artery disease in his father; Heart attack in his father; Stroke in his father. There is no history of Esophageal cancer, Stomach cancer, Rectal cancer, or Prostate cancer.  ROS as above Medications: Current Outpatient Prescriptions  Medication Sig Dispense Refill  . amLODipine (NORVASC) 2.5 MG tablet Take 1  tablet (2.5 mg total) by mouth daily. 90 tablet 0  . atorvastatin (LIPITOR) 80 MG tablet Take 0.5 tablets (40 mg total) by mouth at bedtime. 90 tablet 3  . benazepril (LOTENSIN) 40 MG tablet Take 1 tablet (40 mg total) by mouth daily. 90 tablet 1  . Cholecalciferol (VITAMIN D) 1000 UNITS capsule Take 1,000 Units by mouth daily.      . Coenzyme Q10 (COQ10 PO) Take 1 capsule by mouth daily.    . cyclobenzaprine (FLEXERIL) 5 MG tablet Take 1 at bedtime as needed for muscle relaxant. Caution: May cause drowsiness. (Patient taking differently: Take 5 mg by mouth at bedtime as needed for muscle spasms. . Caution: May cause drowsiness.) 10 tablet 0  . diphenhydramine-acetaminophen (TYLENOL PM) 25-500 MG TABS tablet Take 2 tablets by mouth at bedtime.    Marland Kitchen HYDROcodone-acetaminophen (NORCO/VICODIN) 5-325 MG tablet Take 1 tablet by mouth every 6 (six) hours as needed for severe pain. 10 tablet 0  . Melatonin 5 MG TABS Take 5 mg by mouth at bedtime.    . metoprolol succinate (TOPROL-XL) 25 MG 24 hr tablet Take 1 tablet (25 mg total) by mouth daily. 30 tablet 3  . Multiple Vitamin (MULTIVITAMIN WITH MINERALS) TABS tablet Take 1 tablet by mouth daily.    . nitroGLYCERIN (NITROSTAT) 0.4 MG SL tablet  Place 1 tablet (0.4 mg total) under the tongue every 5 (five) minutes as needed for chest pain. 25 tablet 3  . pantoprazole (PROTONIX) 40 MG tablet Take 1 tablet (40 mg total) by mouth daily. 90 tablet 1  . Probiotic Product (PROBIOTIC DAILY PO) Take 1 tablet by mouth daily.    . rivaroxaban (XARELTO) 20 MG TABS tablet Take 1 tablet (20 mg total) by mouth daily with supper. Take 1 tablet by mouth daily with supper. 90 tablet 1  . vitamin B-12 (CYANOCOBALAMIN) 1000 MCG tablet Take 1,000 mcg by mouth daily.     No current facility-administered medications for this visit.   No Known Allergies   Exam:  BP 144/76 mmHg  Pulse 82  Wt 208 lb (94.348 kg) Gen: Well NAD Left wrist is normal-appearing with minimal  tenderness at the radial styloid.  X-ray left wrist shows minimal bone healing without further displacement of the radial styloid fracture  No results found for this or any previous visit (from the past 24 hour(s)). No results found.   82 year old male with slow healing of a left radius styloid fracture. Continue mobilization with thumb spica Exos cast. Recheck in 2-3 weeks. If no significant bony healing then would pursue bone stimulator

## 2016-04-04 NOTE — Progress Notes (Signed)
Quick Note:  Xray is stable appearing. ______

## 2016-04-11 ENCOUNTER — Telehealth: Payer: Self-pay | Admitting: *Deleted

## 2016-04-11 ENCOUNTER — Encounter: Payer: Self-pay | Admitting: *Deleted

## 2016-04-11 NOTE — Telephone Encounter (Signed)
Pre-Visit Call completed with patient and chart updated.   Pre-Visit Info documented in Specialty Comments under [facility].    

## 2016-04-12 ENCOUNTER — Encounter: Payer: Self-pay | Admitting: Internal Medicine

## 2016-04-12 ENCOUNTER — Ambulatory Visit (INDEPENDENT_AMBULATORY_CARE_PROVIDER_SITE_OTHER): Payer: Medicare Other | Admitting: Internal Medicine

## 2016-04-12 VITALS — BP 150/70 | HR 52 | Temp 97.5°F | Ht 70.0 in | Wt 206.8 lb

## 2016-04-12 DIAGNOSIS — R399 Unspecified symptoms and signs involving the genitourinary system: Secondary | ICD-10-CM | POA: Diagnosis not present

## 2016-04-12 DIAGNOSIS — E785 Hyperlipidemia, unspecified: Secondary | ICD-10-CM | POA: Diagnosis not present

## 2016-04-12 DIAGNOSIS — Z09 Encounter for follow-up examination after completed treatment for conditions other than malignant neoplasm: Secondary | ICD-10-CM

## 2016-04-12 DIAGNOSIS — E1159 Type 2 diabetes mellitus with other circulatory complications: Secondary | ICD-10-CM

## 2016-04-12 DIAGNOSIS — I10 Essential (primary) hypertension: Secondary | ICD-10-CM

## 2016-04-12 DIAGNOSIS — Z0000 Encounter for general adult medical examination without abnormal findings: Secondary | ICD-10-CM | POA: Diagnosis not present

## 2016-04-12 LAB — LIPID PANEL
CHOL/HDL RATIO: 3
Cholesterol: 114 mg/dL (ref 0–200)
HDL: 33.4 mg/dL — AB (ref >39.00)
LDL CALC: 53 mg/dL (ref 0–99)
NONHDL: 80.12
Triglycerides: 137 mg/dL (ref 0.0–149.0)
VLDL: 27.4 mg/dL (ref 0.0–40.0)

## 2016-04-12 LAB — CBC WITH DIFFERENTIAL/PLATELET
Basophils Absolute: 0 10*3/uL (ref 0.0–0.1)
Basophils Relative: 0.3 % (ref 0.0–3.0)
Eosinophils Absolute: 0.2 10*3/uL (ref 0.0–0.7)
Eosinophils Relative: 2.2 % (ref 0.0–5.0)
HCT: 42.6 % (ref 39.0–52.0)
Hemoglobin: 14.4 g/dL (ref 13.0–17.0)
LYMPHS ABS: 1.8 10*3/uL (ref 0.7–4.0)
Lymphocytes Relative: 26.7 % (ref 12.0–46.0)
MCHC: 33.7 g/dL (ref 30.0–36.0)
MCV: 94 fl (ref 78.0–100.0)
MONO ABS: 0.6 10*3/uL (ref 0.1–1.0)
Monocytes Relative: 9.6 % (ref 3.0–12.0)
NEUTROS ABS: 4.1 10*3/uL (ref 1.4–7.7)
Neutrophils Relative %: 61.2 % (ref 43.0–77.0)
PLATELETS: 174 10*3/uL (ref 150.0–400.0)
RBC: 4.53 Mil/uL (ref 4.22–5.81)
RDW: 14.3 % (ref 11.5–15.5)
WBC: 6.8 10*3/uL (ref 4.0–10.5)

## 2016-04-12 LAB — HEMOGLOBIN A1C: HEMOGLOBIN A1C: 6 % (ref 4.6–6.5)

## 2016-04-12 LAB — ALT: ALT: 19 U/L (ref 0–53)

## 2016-04-12 LAB — AST: AST: 22 U/L (ref 0–37)

## 2016-04-12 MED ORDER — AMLODIPINE BESYLATE 5 MG PO TABS
5.0000 mg | ORAL_TABLET | Freq: Every day | ORAL | Status: DC
Start: 2016-04-12 — End: 2016-10-26

## 2016-04-12 NOTE — Assessment & Plan Note (Addendum)
Td 2013;  S/p PNM shot ; prevnar 2015 ;zostavax 2016  Cscope 02-2012 (-), next per GI  Prostate cancer screening, see previous entry, no further screening  Diet and exercise discussed

## 2016-04-12 NOTE — Progress Notes (Signed)
Subjective:    Patient ID: [name], male    DOB: Aug 08, 1931, 82 y.o.   MRN: [id number]  DOS:  04/12/2016 Type of visit - description : CPX Also  discuss chronic medical issues: High cholesterol: Cardiology recommend to increase Lipitor to 80, patient taking only 40 HTN: BP today slightly elevated, at home usually 140, 150. DM: Diet control, diet is healthy most of the time LUTS: Continue with nocturia, sometimes he sees "2 streams" but that is not consistent. No gross hematuria Lower extremity edema: Ongoing issue, mild to moderate.   BP Readings from Last 3 Encounters:  04/12/16 150/70  04/03/16 144/76  03/20/16 146/81     Review of Systems  Constitutional: No fever. No chills. No unexplained wt changes. No unusual sweats  HEENT: No dental problems, no ear discharge, no facial swelling, no voice changes. No eye discharge, no eye  redness , no  intolerance to light   Respiratory: No wheezing , no  difficulty breathing. No cough , no mucus production  Cardiovascular: No CP, no leg swelling , no  Palpitations  GI: no nausea, no vomiting, no diarrhea , no  abdominal pain.  No blood in the stools. No dysphagia, no odynophagia    Endocrine: No polyphagia, no polyuria , no polydipsia  GU: No dysuria, gross hematuria, nol difficulty urinating..  Musculoskeletal: no  unusual aches or pains  Skin: No change in the color of the skin, palor , no  Rash  Allergic, immunologic: No environmental allergies , no  food allergies  Neurological: No dizziness no  syncope. No headaches. No diplopia, no slurred, no slurred speech, no motor deficits, no facial  Numbness  Hematological: No enlarged lymph nodes, no easy bruising , no unusual bleedings  Psychiatry: No suicidal ideas, no hallucinations, no beavior problems, no confusion.  No unusual/severe anxiety, no depression    Past Medical History  Diagnosis Date  . CAD (coronary artery disease)     s/p CABG 1993 (L-LAD, S-RI,  S-D1);   echo 1/10: EF 55%;    myoview 12/09: inf MI, no ischemia  . Hyperlipidemia   . HTN (hypertension)   . Folliculitis   . Obesity   . Osteoarthritis   . Irritable bladder   . Family history of malignant neoplasm of gastrointestinal tract   . Myocardial infarction ([place])   . Candida esophagitis ([place]) 2013    EGD   . Antral gastritis 2013    EGD   . Hiatal hernia   . Atrial fibrillation ([place])   . Anemia     Past Surgical History  Procedure Laterality Date  . Coronary artery bypass graft      graft '92: LIMA-LAD, SVG - D2,R1, D1  . Cholecystectomy      laparoscopic '92  . Mole removal  2001 and 2008  . Pilonidal cyst excision    . Enteroscopy  08/22/2012    Procedure: ENTEROSCOPY;  Surgeon: Juanita Craver, MD;  Location: [facility];  Service: Endoscopy;  Laterality: N/A;  . Knee surgery      Social History   Social History  . Marital Status: Widowed    Spouse Name: N/A  . Number of Children: 1  . Years of Education: N/A   Occupational History  . Retired    Social History Main Topics  . Smoking status: Former Smoker -- 16 years    Quit date: 08/14/1964  . Smokeless tobacco: Never Used     Comment: quit 1965  . Alcohol  Use: No  . Drug Use: No  . Sexual Activity: Not on file   Other Topics Concern  . 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 Married '57- widowed 03/29/23. 1 son -'26- died '58 MVA.    Lives by himself   No immediate family. Emergency contact : Daron Offer [phone] (friend)        Medication List       This list is accurate as of: 04/12/16  2:48 PM.  Always use your most recent med list.               amLODipine 5 MG tablet  Commonly known as:  NORVASC  Take 1 tablet (5 mg total) by mouth daily.     atorvastatin 80 MG tablet  Commonly known as:  LIPITOR  Take 0.5 tablets (40 mg total) by mouth at bedtime.     benazepril 40 MG tablet  Commonly known as:  LOTENSIN  Take 1 tablet (40 mg total) by mouth daily.       COQ10 PO  Take 1 capsule by mouth daily.     cyclobenzaprine 5 MG tablet  Commonly known as:  FLEXERIL  Take 1 at bedtime as needed for muscle relaxant. Caution: May cause drowsiness.     diphenhydramine-acetaminophen 25-500 MG Tabs tablet  Commonly known as:  TYLENOL PM  Take 2 tablets by mouth at bedtime.     HYDROcodone-acetaminophen 5-325 MG tablet  Commonly known as:  NORCO/VICODIN  Take 1 tablet by mouth every 6 (six) hours as needed for severe pain.     Melatonin 5 MG Tabs  Take 5 mg by mouth at bedtime.     metoprolol succinate 25 MG 24 hr tablet  Commonly known as:  TOPROL-XL  Take 1 tablet (25 mg total) by mouth daily.     multivitamin with minerals Tabs tablet  Take 1 tablet by mouth daily.     nitroGLYCERIN 0.4 MG SL tablet  Commonly known as:  NITROSTAT  Place 1 tablet (0.4 mg total) under the tongue every 5 (five) minutes as needed for chest pain.     pantoprazole 40 MG tablet  Commonly known as:  PROTONIX  Take 1 tablet (40 mg total) by mouth daily.     PROBIOTIC DAILY PO  Take 1 tablet by mouth daily.     rivaroxaban 20 MG Tabs tablet  Commonly known as:  XARELTO  Take 1 tablet (20 mg total) by mouth daily with supper. Take 1 tablet by mouth daily with supper.     vitamin B-12 1000 MCG tablet  Commonly known as:  CYANOCOBALAMIN  Take 1,000 mcg by mouth daily.     Vitamin D 1000 units capsule  Take 1,000 Units by mouth daily.           Objective:   Physical Exam BP 150/70 mmHg  Pulse 52  Temp(Src) 97.5 F (36.4 C) (Oral)  Ht 5\' 10"  (1.778 m)  Wt 206 lb 12.8 oz (93.804 kg)  BMI 29.67 kg/m2  SpO2 96% General:   Well developed, well nourished . NAD.  HEENT:  Normocephalic . Face symmetric, atraumatic Lungs:  CTA B Normal respiratory effort, no intercostal retractions, no accessory muscle use. Heart: Regular,  no murmur.  + Left ankle edema. Good pedal pulse on the left   Abdomen:  Not distended, soft, non-tender. No rebound or  rigidity.  GU: Normal scrotal contents, penis normal including  the  urinary meatus. Skin: Not pale. Not jaundice Neurologic:  alert & oriented X3.  Speech normal, gait appropriate for age and unassisted Psych--  Cognition and judgment appear intact.  Cooperative with normal attention span and concentration.  Behavior appropriate. No anxious or depressed appearing.      Assessment & Plan:   Assessment >  DM --diet control, no neuropathy HTN Hyperlipidemia CV: --CAD, CABG 1993, Myoview 2009 no ischemia. --Atrial fibrillation -- rate control, xarelto Venous insufficiency, mild LE edema L>>R  LUTS DJD GI: --Candida esophagitis, antral gastritis --->  2013 per EGD --HH  Plan: Diabetes: Continue with healthy diet, check A1c HTN: BP mildly elevated, continue benazepril, Toprol. Increase amlodipine to 5 mg, watch for lower extremity edema worsening. Hyperlipidemia: Cardiology Rx Lipitor 80, pt reluctant to take a high-dose, currently taking 40 mg, labs. A Fibrillation: Rate well-controlled, samples of Xarelto provided Venous insufficiency with left ankle edema, at baseline. LUTS: Mildly stable symptoms, declined a urology referral RTC 3 months  No AVS computers down at time of check out

## 2016-04-12 NOTE — Progress Notes (Signed)
Pre visit review using our clinic review tool, if applicable. No additional management support is needed unless otherwise documented below in the visit note. 

## 2016-04-12 NOTE — Assessment & Plan Note (Signed)
Diabetes: Continue with healthy diet, check A1c HTN: BP mildly elevated, continue benazepril, Toprol. Increase amlodipine to 5 mg, watch for lower extremity edema worsening. Hyperlipidemia: Cardiology Rx Lipitor 80, pt reluctant to take a high-dose, currently taking 40 mg, labs. A Fibrillation: Rate well-controlled, samples of Xarelto provided Venous insufficiency with left ankle edema, at baseline. LUTS: Mildly stable symptoms, declined a urology referral RTC 3 months  No AVS computers down at time of check out

## 2016-04-16 ENCOUNTER — Other Ambulatory Visit: Payer: Self-pay | Admitting: Internal Medicine

## 2016-04-22 ENCOUNTER — Encounter: Payer: Self-pay | Admitting: Family Medicine

## 2016-04-22 ENCOUNTER — Ambulatory Visit (INDEPENDENT_AMBULATORY_CARE_PROVIDER_SITE_OTHER): Payer: Medicare Other

## 2016-04-22 ENCOUNTER — Ambulatory Visit (INDEPENDENT_AMBULATORY_CARE_PROVIDER_SITE_OTHER): Payer: Medicare Other | Admitting: Family Medicine

## 2016-04-22 VITALS — BP 130/70 | HR 66 | Wt 209.0 lb

## 2016-04-22 DIAGNOSIS — S52512A Displaced fracture of left radial styloid process, initial encounter for closed fracture: Secondary | ICD-10-CM | POA: Diagnosis not present

## 2016-04-22 DIAGNOSIS — X58XXXD Exposure to other specified factors, subsequent encounter: Secondary | ICD-10-CM

## 2016-04-22 DIAGNOSIS — S52512D Displaced fracture of left radial styloid process, subsequent encounter for closed fracture with routine healing: Secondary | ICD-10-CM

## 2016-04-22 NOTE — Patient Instructions (Signed)
Thank you for coming in today. Continue the use the cast with activity such as driving and house work/gardning.  I think its ok to not use the cast at sleep and bathing.  Resume the cast if you have pain or soreness.  Return in 3 weeks.

## 2016-04-22 NOTE — Progress Notes (Signed)
[name] is a 82 y.o. male who presents to [facility] today for distal radius fracture. Patient has been managed with immobilization for a nondisplaced distal radius styloid fracture since 02/27/2016. His left wrist essentially feels well now. He denies any pain.   Past Medical History  Diagnosis Date  . CAD (coronary artery disease)     s/p CABG 1993 (L-LAD, S-RI, S-D1);   echo 1/10: EF 55%;    myoview 12/09: inf MI, no ischemia  . Hyperlipidemia   . HTN (hypertension)   . Folliculitis   . Obesity   . Osteoarthritis   . Irritable bladder   . Family history of malignant neoplasm of gastrointestinal tract   . Myocardial infarction ([place])   . Candida esophagitis ([place]) 2013    EGD   . Antral gastritis 2013    EGD   . Hiatal hernia   . Atrial fibrillation ([place])   . Anemia    Past Surgical History  Procedure Laterality Date  . Coronary artery bypass graft      graft '92: LIMA-LAD, SVG - D2,R1, D1  . Cholecystectomy      laparoscopic '92  . Mole removal  2001 and 2008  . Pilonidal cyst excision    . Enteroscopy  08/22/2012    Procedure: ENTEROSCOPY;  Surgeon: Juanita Craver, MD;  Location: [facility];  Service: Endoscopy;  Laterality: N/A;  . Knee surgery     Social History  Substance Use Topics  . Smoking status: Former Smoker -- 16 years    Quit date: 08/14/1964  . Smokeless tobacco: Never Used     Comment: quit 1965  . Alcohol Use: No   family history includes Alzheimer's disease in his mother; Breast cancer in his mother; Colon cancer in his maternal uncle; Coronary artery disease in his father; Heart attack in his father; Stroke in his father. There is no history of Esophageal cancer, Stomach cancer, Rectal cancer, or Prostate cancer.  ROS as above Medications: Current Outpatient Prescriptions  Medication Sig Dispense Refill  . amLODipine (NORVASC) 5 MG tablet  Take 1 tablet (5 mg total) by mouth daily. 30 tablet 6  . atorvastatin (LIPITOR) 80 MG tablet Take 0.5 tablets (40 mg total) by mouth at bedtime. 90 tablet 3  . benazepril (LOTENSIN) 40 MG tablet Take 1 tablet (40 mg total) by mouth daily. 90 tablet 2  . Cholecalciferol (VITAMIN D) 1000 UNITS capsule Take 1,000 Units by mouth daily.      . Coenzyme Q10 (COQ10 PO) Take 1 capsule by mouth daily.    . cyclobenzaprine (FLEXERIL) 5 MG tablet Take 1 at bedtime as needed for muscle relaxant. Caution: May cause drowsiness. (Patient taking differently: Take 5 mg by mouth at bedtime as needed for muscle spasms. . Caution: May cause drowsiness.) 10 tablet 0  . diphenhydramine-acetaminophen (TYLENOL PM) 25-500 MG TABS tablet Take 2 tablets by mouth at bedtime.    Marland Kitchen HYDROcodone-acetaminophen (NORCO/VICODIN) 5-325 MG tablet Take 1 tablet by mouth every 6 (six) hours as needed for severe pain. 10 tablet 0  . Melatonin 5 MG TABS Take 5 mg by mouth at bedtime.    . metoprolol succinate (TOPROL-XL) 25 MG 24 hr tablet Take 1 tablet (25 mg total) by mouth daily. 30 tablet 3  . Multiple Vitamin (MULTIVITAMIN WITH MINERALS) TABS tablet Take 1 tablet by mouth daily.    . nitroGLYCERIN (NITROSTAT) 0.4 MG SL tablet Place 1 tablet (0.4  mg total) under the tongue every 5 (five) minutes as needed for chest pain. 25 tablet 3  . pantoprazole (PROTONIX) 40 MG tablet Take 1 tablet (40 mg total) by mouth daily. 90 tablet 1  . Probiotic Product (PROBIOTIC DAILY PO) Take 1 tablet by mouth daily.    . rivaroxaban (XARELTO) 20 MG TABS tablet Take 1 tablet (20 mg total) by mouth daily with supper. Take 1 tablet by mouth daily with supper. 90 tablet 1  . vitamin B-12 (CYANOCOBALAMIN) 1000 MCG tablet Take 1,000 mcg by mouth daily.     No current facility-administered medications for this visit.   No Known Allergies   Exam:  BP 130/70 mmHg  Pulse 66  Wt 209 lb (94.802 kg) Gen: Well NAD This is normal appearing and nontender.  Decreased range of motion. Pulses capillary refill and sensation intact.  X-ray left wrist shows incomplete healing of a fracture of the radial styloid. Not much change since the last x-ray. Awaiting formal radiology review  No results found for this or any previous visit (from the past 24 hour(s)). No results found.  82 year old male with delayed healing of a left radial styloid fracture. Discussed options. Wean out of immobilization as patient is clinically doing very well. Recheck in 2-3 weeks. Consider bone stimulator if no further healing.

## 2016-04-23 NOTE — Progress Notes (Signed)
Quick Note:  The wrist fracture is still not showing a lot of healing. ______

## 2016-04-24 ENCOUNTER — Telehealth: Payer: Self-pay | Admitting: Internal Medicine

## 2016-04-24 NOTE — Telephone Encounter (Signed)
Tried calling Pt, received busy signal, will try again later.  

## 2016-04-24 NOTE — Telephone Encounter (Signed)
Notes Recorded by Colon Branch, MD on 04/14/2016 at 12:10 PM Releasing results to [place] with comments: [place], your cholesterol continue to be under excellent control, the blood sugar test is also very good at 6.0. Other tests are normal. Very good results !

## 2016-04-24 NOTE — Telephone Encounter (Signed)
Spoke w/ Pt, informed him of lab results, he requested I print results and place them at the front desk for pick up. Informed him that I would and he can pick them up at his convenience.

## 2016-04-24 NOTE — Telephone Encounter (Signed)
Relation to [id number] Call back number:[phone] Pharmacy:  Reason for call:  Patient inquiring about lab results

## 2016-05-10 ENCOUNTER — Other Ambulatory Visit: Payer: Self-pay | Admitting: Internal Medicine

## 2016-05-13 ENCOUNTER — Encounter: Payer: Self-pay | Admitting: Family Medicine

## 2016-05-13 ENCOUNTER — Ambulatory Visit (INDEPENDENT_AMBULATORY_CARE_PROVIDER_SITE_OTHER): Payer: Medicare Other

## 2016-05-13 ENCOUNTER — Ambulatory Visit (INDEPENDENT_AMBULATORY_CARE_PROVIDER_SITE_OTHER): Payer: Medicare Other | Admitting: Family Medicine

## 2016-05-13 VITALS — BP 111/75 | HR 76 | Wt 209.0 lb

## 2016-05-13 DIAGNOSIS — S52512A Displaced fracture of left radial styloid process, initial encounter for closed fracture: Secondary | ICD-10-CM | POA: Diagnosis not present

## 2016-05-13 DIAGNOSIS — S52515G Nondisplaced fracture of left radial styloid process, subsequent encounter for closed fracture with delayed healing: Secondary | ICD-10-CM | POA: Diagnosis not present

## 2016-05-13 DIAGNOSIS — X58XXXD Exposure to other specified factors, subsequent encounter: Secondary | ICD-10-CM | POA: Diagnosis not present

## 2016-05-13 NOTE — Patient Instructions (Signed)
Thank you for coming in today. Use the brace as needed.  Advance activity slowly.  Return as needed.

## 2016-05-13 NOTE — Progress Notes (Signed)
[name] is a 82 y.o. male who presents to [name] today for follow-up left wrist fracture. Patient suffered a fracture at the left radial styloid on late March. He's had immobilization since. He has poor healing seen on x-ray. Since the last visit he's not been using the Exos cast much at home and denies any significant pain or soreness. He feels quite well and is essentially asymptomatic in his wrist. He does note pain at the base of thumb he attributes to arthritis.   Past Medical History  Diagnosis Date  . CAD (coronary artery disease)     s/p CABG 1993 (L-LAD, S-RI, S-D1);   echo 1/10: EF 55%;    myoview 12/09: inf MI, no ischemia  . Hyperlipidemia   . HTN (hypertension)   . Folliculitis   . Obesity   . Osteoarthritis   . Irritable bladder   . Family history of malignant neoplasm of gastrointestinal tract   . Myocardial infarction ([place])   . Candida esophagitis ([place]) 2013    EGD   . Antral gastritis 2013    EGD   . Hiatal hernia   . Atrial fibrillation ([place])   . Anemia    Past Surgical History  Procedure Laterality Date  . Coronary artery bypass graft      graft '92: LIMA-LAD, SVG - D2,R1, D1  . Cholecystectomy      laparoscopic '92  . Mole removal  2001 and 2008  . Pilonidal cyst excision    . Enteroscopy  08/22/2012    Procedure: ENTEROSCOPY;  Surgeon: Juanita Craver, MD;  Location: [facility];  Service: Endoscopy;  Laterality: N/A;  . Knee surgery     Social History  Substance Use Topics  . Smoking status: Former Smoker -- 16 years    Quit date: 08/14/1964  . Smokeless tobacco: Never Used     Comment: quit 1965  . Alcohol Use: No   family history includes Alzheimer's disease in his mother; Breast cancer in his mother; Colon cancer in his maternal uncle; Coronary artery disease in his father; Heart attack in his father; Stroke in his  father. There is no history of Esophageal cancer, Stomach cancer, Rectal cancer, or Prostate cancer.  ROS as above:  Medications: Current Outpatient Prescriptions  Medication Sig Dispense Refill  . amLODipine (NORVASC) 5 MG tablet Take 1 tablet (5 mg total) by mouth daily. 30 tablet 6  . atorvastatin (LIPITOR) 80 MG tablet Take 0.5 tablets (40 mg total) by mouth at bedtime. 90 tablet 3  . benazepril (LOTENSIN) 40 MG tablet Take 1 tablet (40 mg total) by mouth daily. 90 tablet 2  . Cholecalciferol (VITAMIN D) 1000 UNITS capsule Take 1,000 Units by mouth daily.      . Coenzyme Q10 (COQ10 PO) Take 1 capsule by mouth daily.    . cyclobenzaprine (FLEXERIL) 5 MG tablet Take 1 at bedtime as needed for muscle relaxant. Caution: May cause drowsiness. (Patient taking differently: Take 5 mg by mouth at bedtime as needed for muscle spasms. . Caution: May cause drowsiness.) 10 tablet 0  . diphenhydramine-acetaminophen (TYLENOL PM) 25-500 MG TABS tablet Take 2 tablets by mouth at bedtime.    Marland Kitchen HYDROcodone-acetaminophen (NORCO/VICODIN) 5-325 MG tablet Take 1 tablet by mouth every 6 (six) hours as needed for severe pain. 10 tablet 0  . Melatonin 5 MG TABS Take 5 mg by mouth at bedtime.    . metoprolol succinate (TOPROL-XL)  25 MG 24 hr tablet Take 1 tablet (25 mg total) by mouth daily. 30 tablet 3  . Multiple Vitamin (MULTIVITAMIN WITH MINERALS) TABS tablet Take 1 tablet by mouth daily.    . nitroGLYCERIN (NITROSTAT) 0.4 MG SL tablet Place 1 tablet (0.4 mg total) under the tongue every 5 (five) minutes as needed for chest pain. 25 tablet 3  . pantoprazole (PROTONIX) 40 MG tablet Take 1 tablet (40 mg total) by mouth daily. 90 tablet 2  . Probiotic Product (PROBIOTIC DAILY PO) Take 1 tablet by mouth daily.    . rivaroxaban (XARELTO) 20 MG TABS tablet Take 1 tablet (20 mg total) by mouth daily with supper. Take 1 tablet by mouth daily with supper. 90 tablet 1  . vitamin B-12 (CYANOCOBALAMIN) 1000 MCG tablet Take  1,000 mcg by mouth daily.     No current facility-administered medications for this visit.   No Known Allergies   Exam:  BP 111/75 mmHg  Pulse 76  Wt 209 lb (94.802 kg) Gen: Well NAD Left hand and wrist. Slightly swollen CMC joint thumb mildly tender. Radial styloid is nontender to palpation. Normal wrist motion pulses capillary refill and sensation.  X-ray left wrist shows no significant callus formation. Slight improvement in appearance of fracture. Awaiting formal radiology read  No results found for this or any previous visit (from the past 24 hour(s)). No results found.    Assessment and Plan: 82 y.o. male with 9 weeks status post left radial styloid fracture. At this point I think he essentially has a fibrous union but is clinically doing very well. He has decided to essentially stopped immobilization which I think is pretty reasonable. Plan to call if not better. We will start Hand PT if thumb pain is still present. Return in 1 month if not 100% better.   Discussed warning signs or symptoms. Please see discharge instructions. Patient expresses understanding.

## 2016-05-14 NOTE — Progress Notes (Signed)
Quick Note:  Very slowly healing fracture. ______

## 2016-07-15 ENCOUNTER — Ambulatory Visit (INDEPENDENT_AMBULATORY_CARE_PROVIDER_SITE_OTHER): Payer: Medicare Other | Admitting: Internal Medicine

## 2016-07-15 ENCOUNTER — Encounter: Payer: Self-pay | Admitting: Internal Medicine

## 2016-07-15 VITALS — BP 130/72 | HR 79 | Temp 98.0°F | Resp 14 | Ht 70.0 in | Wt 212.2 lb

## 2016-07-15 DIAGNOSIS — R399 Unspecified symptoms and signs involving the genitourinary system: Secondary | ICD-10-CM

## 2016-07-15 DIAGNOSIS — I10 Essential (primary) hypertension: Secondary | ICD-10-CM | POA: Diagnosis not present

## 2016-07-15 DIAGNOSIS — E1159 Type 2 diabetes mellitus with other circulatory complications: Secondary | ICD-10-CM | POA: Diagnosis not present

## 2016-07-15 LAB — PSA: PSA: 2.25 ng/mL (ref 0.10–4.00)

## 2016-07-15 MED ORDER — TAMSULOSIN HCL 0.4 MG PO CAPS: 0.4000 mg | ORAL_CAPSULE | Freq: Every day | ORAL | 5 refills | Status: DC

## 2016-07-15 NOTE — Progress Notes (Signed)
Subjective:    Patient ID: [name], male    DOB: [birth year], 82 y.o.   MRN: [id number]  DOS:  07/15/2016 Type of visit - description : f/u Interval history: HTN- Amlodipine dose increased, good compliance, no apparent side effects, no ambulatory BPs Continue c/o bladder symptoms mostly nocturia; during the daytime he has a normal urinary frequency but reports he  "urinate a lot".   Review of Systems  Edema at baseline Also continue with knee pain.   Past Medical History:  Diagnosis Date  . Anemia   . Antral gastritis 2013   EGD   . Atrial fibrillation ([place])   . CAD (coronary artery disease)    s/p CABG 1993 (L-LAD, S-RI, S-D1);   echo 1/10: EF 55%;    myoview 12/09: inf MI, no ischemia  . Candida esophagitis ([place]) 2013   EGD   . Family history of malignant neoplasm of gastrointestinal tract   . Folliculitis   . Hiatal hernia   . HTN (hypertension)   . Hyperlipidemia   . Irritable bladder   . Myocardial infarction ([place])   . Obesity   . Osteoarthritis     Past Surgical History:  Procedure Laterality Date  . CHOLECYSTECTOMY     laparoscopic '92  . CORONARY ARTERY BYPASS GRAFT     graft '92: LIMA-LAD, SVG - D2,R1, D1  . ENTEROSCOPY  08/22/2012   Procedure: ENTEROSCOPY;  Surgeon: Juanita Craver, MD;  Location: [facility];  Service: Endoscopy;  Laterality: N/A;  . KNEE SURGERY    . MOLE REMOVAL  2001 and 2008  . PILONIDAL CYST EXCISION      Social History   Social History  . Marital status: Widowed    Spouse name: N/A  . Number of children: 1  . Years of education: N/A   Occupational History  . Retired Retired   Social History Main Topics  . Smoking status: Former Smoker    Years: 16.00    Quit date: 08/14/1964  . Smokeless tobacco: Never Used     Comment: quit 1965  . Alcohol use No  . Drug use: No  . Sexual activity: Not on file   Other Topics Concern  . 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 Married '57- widowed 2023/03/31. 1 son  -'13- died '29 MVA.    Lives by himself   No immediate family. Emergency contact : Daron Offer [phone] (friend)        Medication List       Accurate as of 07/15/16 11:59 PM. Always use your most recent med list.          amLODipine 5 MG tablet Commonly known as:  NORVASC Take 1 tablet (5 mg total) by mouth daily.   atorvastatin 80 MG tablet Commonly known as:  LIPITOR Take 0.5 tablets (40 mg total) by mouth at bedtime.   benazepril 40 MG tablet Commonly known as:  LOTENSIN Take 1 tablet (40 mg total) by mouth daily.   COQ10 PO Take 1 capsule by mouth daily.   cyclobenzaprine 5 MG tablet Commonly known as:  FLEXERIL Take 1 at bedtime as needed for muscle relaxant. Caution: May cause drowsiness.   diphenhydramine-acetaminophen 25-500 MG Tabs tablet Commonly known as:  TYLENOL PM Take 2 tablets by mouth at bedtime.   HYDROcodone-acetaminophen 5-325 MG tablet Commonly known as:  NORCO/VICODIN Take 1 tablet by mouth every 6 (six) hours as needed for severe pain.  Melatonin 5 MG Tabs Take 5 mg by mouth at bedtime.   metoprolol succinate 25 MG 24 hr tablet Commonly known as:  TOPROL-XL Take 1 tablet (25 mg total) by mouth daily.   multivitamin with minerals Tabs tablet Take 1 tablet by mouth daily.   nitroGLYCERIN 0.4 MG SL tablet Commonly known as:  NITROSTAT Place 1 tablet (0.4 mg total) under the tongue every 5 (five) minutes as needed for chest pain.   pantoprazole 40 MG tablet Commonly known as:  PROTONIX Take 1 tablet (40 mg total) by mouth daily.   PROBIOTIC DAILY PO Take 1 tablet by mouth daily.   rivaroxaban 20 MG Tabs tablet Commonly known as:  XARELTO Take 1 tablet (20 mg total) by mouth daily with supper. Take 1 tablet by mouth daily with supper.   tamsulosin 0.4 MG Caps capsule Commonly known as:  FLOMAX Take 1 capsule (0.4 mg total) by mouth daily.   vitamin B-12 1000 MCG tablet Commonly known as:  CYANOCOBALAMIN Take 1,000 mcg by  mouth daily.   Vitamin D 1000 units capsule Take 1,000 Units by mouth daily.          Objective:   Physical Exam BP 130/72 (BP Location: Left Arm, Patient Position: Sitting, Cuff Size: Normal)   Pulse 79   Temp 98 F (36.7 C) (Oral)   Resp 14   Ht 5\' 10"  ([id number] m)   Wt 212 lb 4 oz (96.3 kg)   SpO2 97%   BMI 30.45 kg/m  General:   Well developed, well nourished . NAD.  HEENT:  Normocephalic . Face symmetric, atraumatic Lungs:  CTA B Normal respiratory effort, no intercostal retractions, no accessory muscle use. Heart: reg,  no murmur.  periankle-pretibial edema +/+++ Rectal:  External abnormalities: none. Normal sphincter tone. No rectal masses or tenderness.  Stool brown  Prostate: Prostate gland firm and smooth, no enlargement, nodularity, tenderness, mass, asymmetry or induration. \ Skin: Not pale. Not jaundice Neurologic:  alert & oriented X3.  Speech normal, gait appropriate for age and unassisted Psych--  Cognition and judgment appear intact.  Cooperative with normal attention span and concentration.  Behavior appropriate. No anxious or depressed appearing.      Assessment & Plan:   Assessment >  DM --diet control, no neuropathy HTN Hyperlipidemia CV: --CAD, CABG 1993, Myoview 2009 no ischemia. --Atrial fibrillation -- rate control, xarelto Venous insufficiency, mild LE edema L>>R  LUTS DJD GI: --Candida esophagitis, antral gastritis --->  2013 per EGD --HH  PLAN: DM: Under excellent control per last A1c HTN: Continue benazepril, Toprol and amlodipine 5 mg, under better control, edema has not worsened. LUTS: ongoing sx characterized  by nocturia, DRE today essentially normal. Previously urinalysis showed no infection. Will check a PSA, offered Flomax, he agreed , rx sent, watch s/e, call if is not helping RTC 5 months

## 2016-07-15 NOTE — Progress Notes (Signed)
Pre visit review using our clinic review tool, if applicable. No additional management support is needed unless otherwise documented below in the visit note. 

## 2016-07-15 NOTE — Patient Instructions (Signed)
GO TO THE LAB : Get the blood work     GO TO THE FRONT DESK Schedule your next appointment for a  routine checkup in 5 months

## 2016-07-16 NOTE — Assessment & Plan Note (Signed)
DM: Under excellent control per last A1c HTN: Continue benazepril, Toprol and amlodipine 5 mg, under better control, edema has not worsened. LUTS: ongoing sx characterized  by nocturia, DRE today essentially normal. Previously urinalysis showed no infection. Will check a PSA, offered Flomax, he agreed , rx sent, watch s/e, call if is not helping RTC 5 months

## 2016-08-05 ENCOUNTER — Other Ambulatory Visit: Payer: Self-pay | Admitting: Internal Medicine

## 2016-08-05 DIAGNOSIS — L570 Actinic keratosis: Secondary | ICD-10-CM | POA: Diagnosis not present

## 2016-08-05 DIAGNOSIS — Z08 Encounter for follow-up examination after completed treatment for malignant neoplasm: Secondary | ICD-10-CM | POA: Diagnosis not present

## 2016-08-05 DIAGNOSIS — Z85828 Personal history of other malignant neoplasm of skin: Secondary | ICD-10-CM | POA: Diagnosis not present

## 2016-08-05 DIAGNOSIS — L821 Other seborrheic keratosis: Secondary | ICD-10-CM | POA: Diagnosis not present

## 2016-09-12 ENCOUNTER — Encounter: Payer: Self-pay | Admitting: Family Medicine

## 2016-09-12 ENCOUNTER — Ambulatory Visit (INDEPENDENT_AMBULATORY_CARE_PROVIDER_SITE_OTHER): Payer: Medicare Other | Admitting: Family Medicine

## 2016-09-12 ENCOUNTER — Ambulatory Visit (INDEPENDENT_AMBULATORY_CARE_PROVIDER_SITE_OTHER): Payer: Medicare Other

## 2016-09-12 VITALS — BP 131/71 | HR 50 | Wt 219.0 lb

## 2016-09-12 DIAGNOSIS — M25572 Pain in left ankle and joints of left foot: Secondary | ICD-10-CM

## 2016-09-12 DIAGNOSIS — R0989 Other specified symptoms and signs involving the circulatory and respiratory systems: Secondary | ICD-10-CM | POA: Diagnosis not present

## 2016-09-12 DIAGNOSIS — M25472 Effusion, left ankle: Secondary | ICD-10-CM

## 2016-09-12 DIAGNOSIS — M19072 Primary osteoarthritis, left ankle and foot: Secondary | ICD-10-CM | POA: Diagnosis not present

## 2016-09-12 NOTE — Patient Instructions (Signed)
Thank you for coming in today. Return in 1 month for recheck.  Return sooner if needed.  Call or go to the ER if you develop a large red swollen joint with extreme pain or oozing puss.  Consider an ankle sleeve.  Arthritis Arthritis is a term that is commonly used to refer to joint pain or joint disease. There are more than 100 types of arthritis. CAUSES The most common cause of this condition is wear and tear of a joint. Other causes include:  Gout.  Inflammation of a joint.  An infection of a joint.  Sprains and other injuries near the joint.  A drug reaction or allergic reaction. In some cases, the cause may not be known. SYMPTOMS The main symptom of this condition is pain in the joint with movement. Other symptoms include:  Redness, swelling, or stiffness at a joint.  Warmth coming from the joint.  Fever.  Overall feeling of illness. DIAGNOSIS This condition may be diagnosed with a physical exam and tests, including:  Blood tests.  Urine tests.  Imaging tests, such as MRI, X-rays, or a CT scan. Sometimes, fluid is removed from a joint for testing. TREATMENT Treatment for this condition may involve:  Treatment of the cause, if it is known.  Rest.  Raising (elevating) the joint.  Applying cold or hot packs to the joint.  Medicines to improve symptoms and reduce inflammation.  Injections of a steroid such as cortisone into the joint to help reduce pain and inflammation. Depending on the cause of your arthritis, you may need to make lifestyle changes to reduce stress on your joint. These changes may include exercising more and losing weight. HOME CARE INSTRUCTIONS Medicines  Take over-the-counter and prescription medicines only as told by your health care provider.  Do not take aspirin to relieve pain if gout is suspected. Activities  Rest your joint if told by your health care provider. Rest is important when your disease is active and your joint feels  painful, swollen, or stiff.  Avoid activities that make the pain worse. It is important to balance activity with rest.  Exercise your joint regularly with range-of-motion exercises as told by your health care provider. Try doing low-impact exercise, such as:  Swimming.  Water aerobics.  Biking.  Walking. Joint Care  If your joint is swollen, keep it elevated if told by your health care provider.  If your joint feels stiff in the morning, try taking a warm shower.  If directed, apply heat to the joint. If you have diabetes, do not apply heat without permission from your health care provider.  Put a towel between the joint and the hot pack or heating pad.  Leave the heat on the area for 20-30 minutes.  If directed, apply ice to the joint:  Put ice in a plastic bag.  Place a towel between your skin and the bag.  Leave the ice on for 20 minutes, 2-3 times per day.  Keep all follow-up visits as told by your health care provider. This is important. SEEK MEDICAL CARE IF:  The pain gets worse.  You have a fever. SEEK IMMEDIATE MEDICAL CARE IF:  You develop severe joint pain, swelling, or redness.  Many joints become painful and swollen.  You develop severe back pain.  You develop severe weakness in your leg.  You cannot control your bladder or bowels.   This information is not intended to replace advice given to you by your health care provider. Make sure  you discuss any questions you have with your health care provider.   Document Released: 01/02/2005 Document Revised: 08/16/2015 Document Reviewed: 02/20/2015 Elsevier Interactive Patient Education [employer].

## 2016-09-12 NOTE — Progress Notes (Signed)
[name] is a 82 y.o. male who presents to [facility]: [facility] today for left ankle pain and swelling for 3-4 years.  He describes pain diffusely across his ankle but worse over the medial side.  He denies known injury.  Patient claims his pain hs worsened over the past year.  His pain is more noticeable when he walks and plays golf.  He received a steroid injection in the past which provided significant relief.  He denies fever, leg swelling, chest pain, shortness of breath, and claudication.    Patient received his flu shot last week.  Past Medical History:  Diagnosis Date  . Anemia   . Antral gastritis 2013   EGD   . Atrial fibrillation ([place])   . CAD (coronary artery disease)    s/p CABG 1993 (L-LAD, S-RI, S-D1);   echo 1/10: EF 55%;    myoview 12/09: inf MI, no ischemia  . Candida esophagitis ([place]) 2013   EGD   . Family history of malignant neoplasm of gastrointestinal tract   . Folliculitis   . Hiatal hernia   . HTN (hypertension)   . Hyperlipidemia   . Irritable bladder   . Myocardial infarction   . Obesity   . Osteoarthritis    Past Surgical History:  Procedure Laterality Date  . CHOLECYSTECTOMY     laparoscopic '92  . CORONARY ARTERY BYPASS GRAFT     graft '92: LIMA-LAD, SVG - D2,R1, D1  . ENTEROSCOPY  08/22/2012   Procedure: ENTEROSCOPY;  Surgeon: Juanita Craver, MD;  Location: [facility];  Service: Endoscopy;  Laterality: N/A;  . KNEE SURGERY    . MOLE REMOVAL  2001 and 2008  . PILONIDAL CYST EXCISION     Social History  Substance Use Topics  . Smoking status: Former Smoker    Years: 16.00    Quit date: 08/14/1964  . Smokeless tobacco: Never Used     Comment: quit 1965  . Alcohol use No   family history includes Alzheimer's disease in his mother; Breast cancer in his mother; Colon cancer in his maternal uncle; Coronary artery disease in his father;  Heart attack in his father; Stroke in his father.  ROS as above:  Medications: Current Outpatient Prescriptions  Medication Sig Dispense Refill  . amLODipine (NORVASC) 5 MG tablet Take 1 tablet (5 mg total) by mouth daily. 30 tablet 6  . atorvastatin (LIPITOR) 80 MG tablet Take 0.5 tablets (40 mg total) by mouth at bedtime. 90 tablet 3  . benazepril (LOTENSIN) 40 MG tablet Take 1 tablet (40 mg total) by mouth daily. 90 tablet 2  . Cholecalciferol (VITAMIN D) 1000 UNITS capsule Take 1,000 Units by mouth daily.      . Coenzyme Q10 (COQ10 PO) Take 1 capsule by mouth daily.    . cyclobenzaprine (FLEXERIL) 5 MG tablet Take 1 at bedtime as needed for muscle relaxant. Caution: May cause drowsiness. 10 tablet 0  . diphenhydramine-acetaminophen (TYLENOL PM) 25-500 MG TABS tablet Take 2 tablets by mouth at bedtime.    Marland Kitchen HYDROcodone-acetaminophen (NORCO/VICODIN) 5-325 MG tablet Take 1 tablet by mouth every 6 (six) hours as needed for severe pain. 10 tablet 0  . Melatonin 5 MG TABS Take 5 mg by mouth at bedtime.    . metoprolol succinate (TOPROL-XL) 25 MG 24 hr tablet Take 1 tablet (25 mg total) by mouth daily. 30 tablet 5  . Multiple Vitamin (MULTIVITAMIN WITH MINERALS) TABS tablet  Take 1 tablet by mouth daily.    . nitroGLYCERIN (NITROSTAT) 0.4 MG SL tablet Place 1 tablet (0.4 mg total) under the tongue every 5 (five) minutes as needed for chest pain. 25 tablet 3  . pantoprazole (PROTONIX) 40 MG tablet Take 1 tablet (40 mg total) by mouth daily. 90 tablet 2  . Probiotic Product (PROBIOTIC DAILY PO) Take 1 tablet by mouth daily.    . rivaroxaban (XARELTO) 20 MG TABS tablet Take 1 tablet (20 mg total) by mouth daily with supper. Take 1 tablet by mouth daily with supper. 90 tablet 1  . tamsulosin (FLOMAX) 0.4 MG CAPS capsule Take 1 capsule (0.4 mg total) by mouth daily. 30 capsule 5  . vitamin B-12 (CYANOCOBALAMIN) 1000 MCG tablet Take 1,000 mcg by mouth daily.     No current facility-administered  medications for this visit.    No Known Allergies   Exam:  BP 131/71   Pulse (!) 50   Wt 219 lb (99.3 kg)   BMI 31.42 kg/m  Gen: Well NAD Left ankle:  Increased joint size, symmetric to right ankle.  No obvious deformity Non-tender to palpation Range of motion limited secondary to bony structure, equal bilaterally Strength and sensation intact Non-palpable DP pulses bilaterally  Procedure: Real-time Ultrasound Guided Injection of Left ankle  Device: GE Logiq E  Images permanently stored and available for review in the ultrasound unit. Verbal informed consent obtained. Discussed risks and benefits of procedure. Warned about infection bleeding damage to structures skin hypopigmentation and fat atrophy among others. Patient expresses understanding and agreement Time-out conducted.  Noted no overlying erythema, induration, or other signs of local infection.  Skin prepped in a sterile fashion.  Local anesthesia: Topical Ethyl chloride.  With sterile technique and under real time ultrasound guidance: 40mg  kenalog and 52ml marcaine injected easily.  Completed without difficulty  Pain immediately resolved suggesting accurate placement of the medication.  Advised to call if fevers/chills, erythema, induration, drainage, or persistent bleeding.  Images permanently stored and available for review in the ultrasound unit.  Impression: Technically successful ultrasound guided injection.  Lot number: Kenalog: [id number] Marcaine: [id number]  Left ankle Xray: Significant arthritic changes including narrowing of tibiotalar joint space and osteophyte formation per my read.  Formal radiology read pending.     Assessment and Plan: 82 y.o. male with chronic left ankle pain and swelling secondary to osteoarthritis - Ankle injection as above - Wear ankle sleeve Recheck in 1 month.   Poor pulses:  Asymptomatic in the setting of CAD. Likely some component of PAD. Plan to refer back to  PCP.     Orders Placed This Encounter  Procedures  . DG Ankle Complete Left    Standing Status:   Future    Number of Occurrences:   1    Standing Expiration Date:   11/12/2017    Order Specific Question:   Reason for Exam (SYMPTOM  OR DIAGNOSIS REQUIRED)    Answer:   eval pain ?DJD    Order Specific Question:   Preferred imaging location?    Answer:   [facility]    Discussed warning signs or symptoms. Please see discharge instructions. Patient expresses understanding.   CC: Kathlene November, MD

## 2016-09-22 IMAGING — CR DG WRIST COMPLETE 3+V*L*
4 series · 4 of 4 positions shown · non-contrast
Comparison: Wrist series of [DATE]st and March 05, 2016.

CLINICAL DATA: Follow-up of left radial styloid fracture which
occurred on February 27, 2016

EXAM:
LEFT WRIST - COMPLETE 3+ VIEW

[wrist pa]
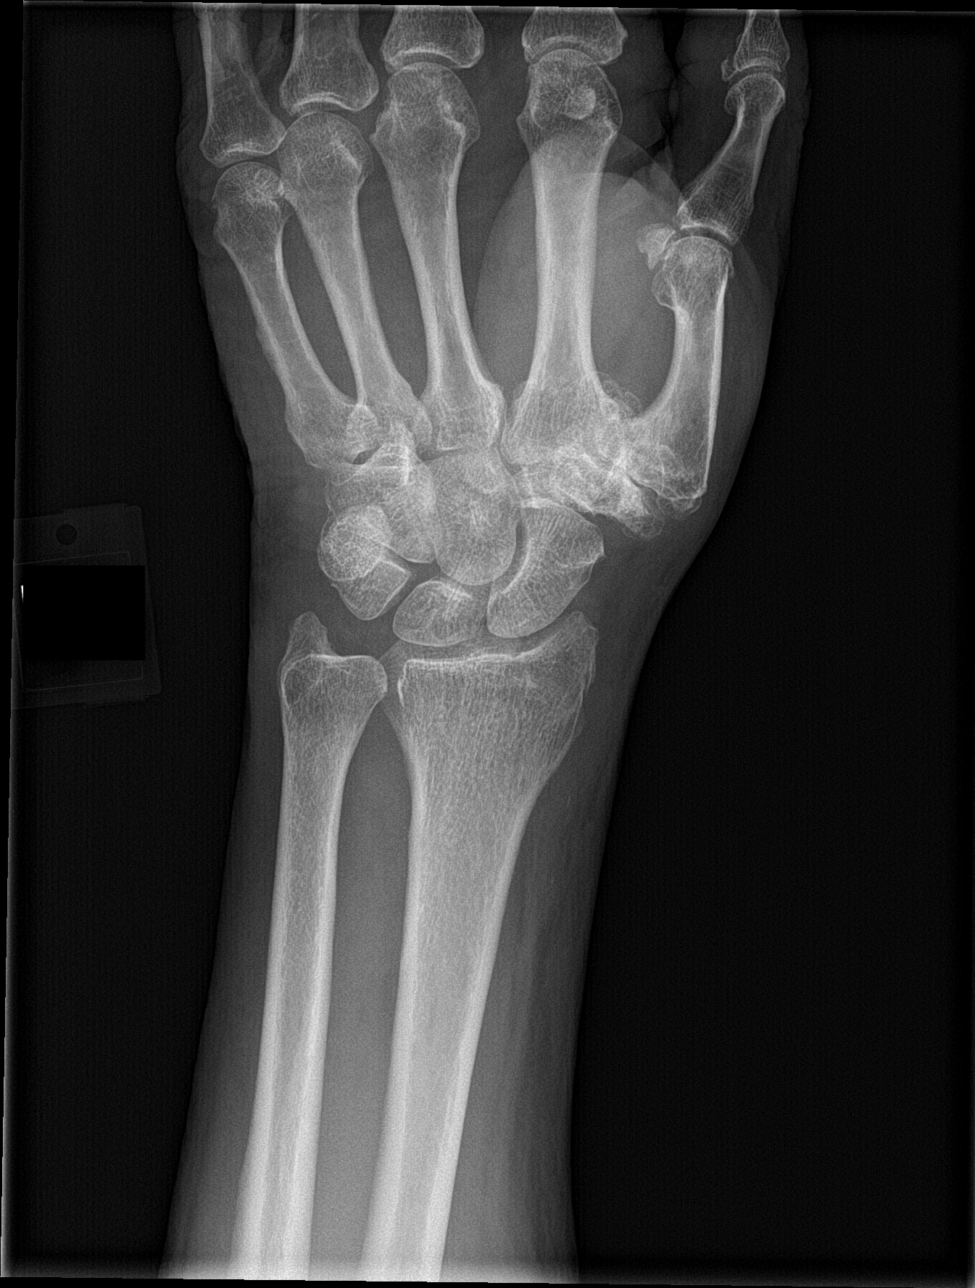

[wrist obl]
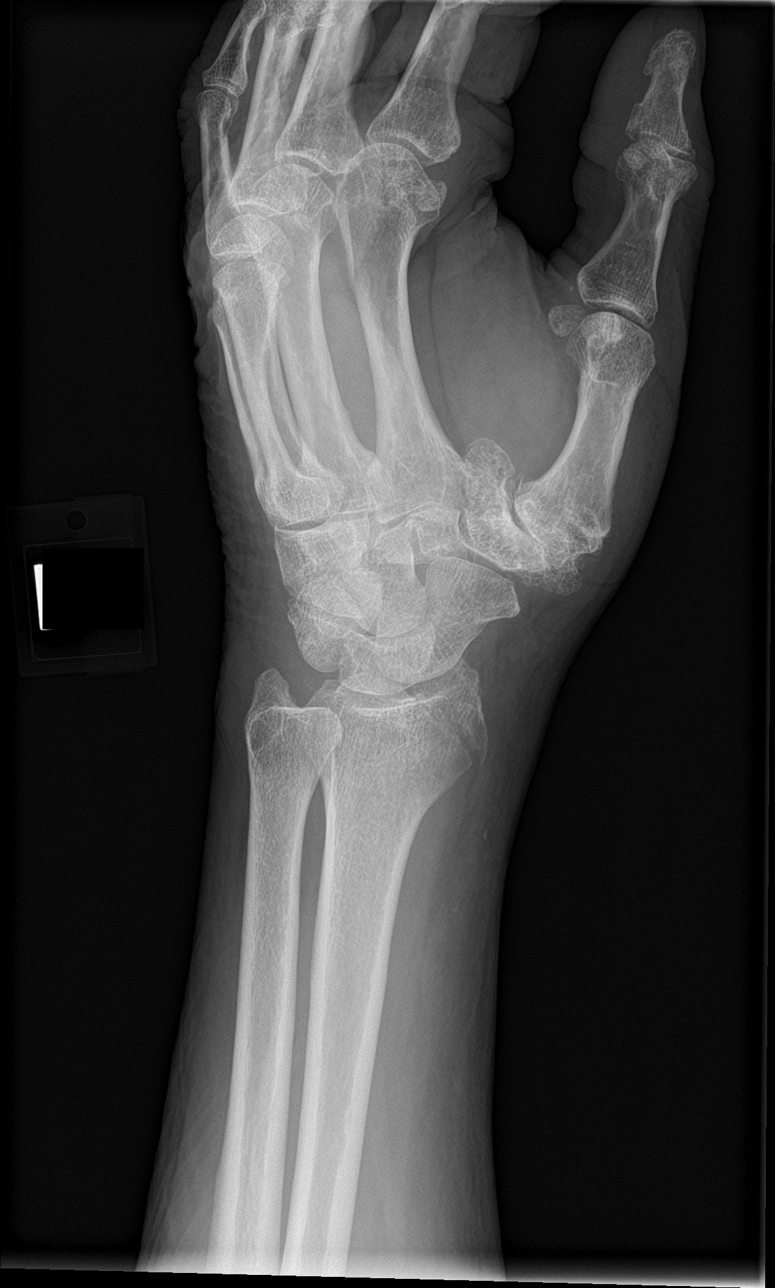

[wrist lat]
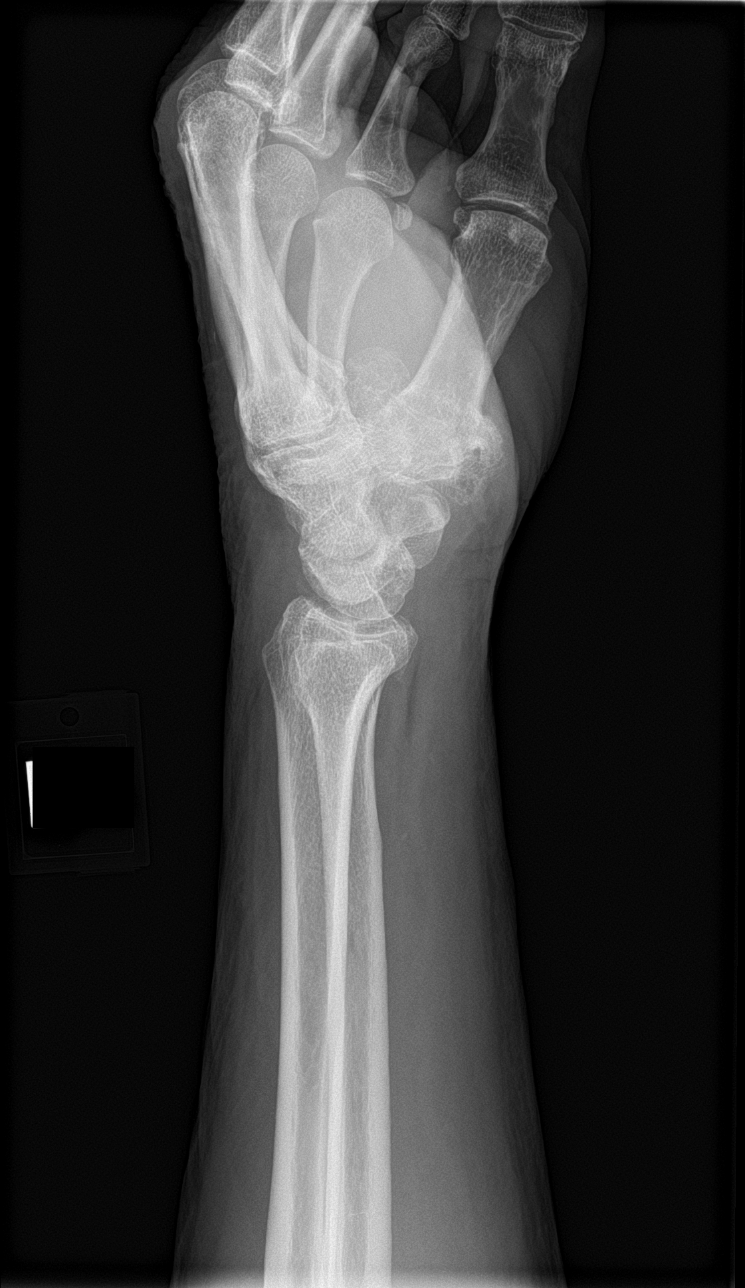

[wrist navicular]
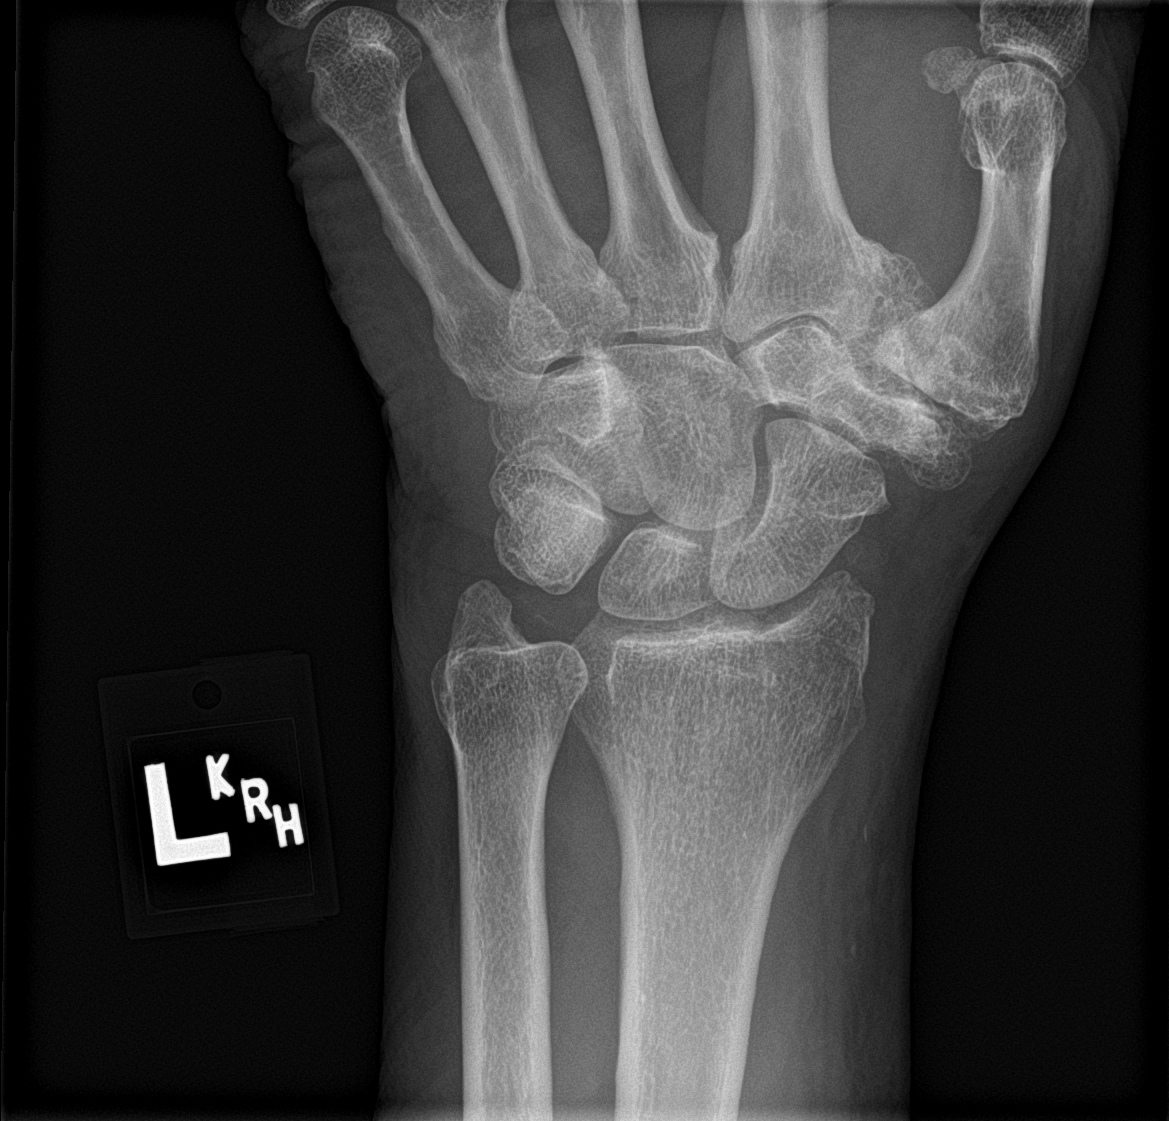

[4 of 4 positions shown; findings below may reference images not displayed]

FINDINGS: The radial styloid fracture is better demonstrated on today's study
likely due to surrounding bony resorption. There is no significant
periosteal reaction. The fracture clearly involves the articular
surface of the distal radius at the radiocarpal joint. The adjacent
ulna is intact. The carpal bones appear intact. There are
degenerative changes of the trapezium and trapezoid and first
metacarpal base.
IMPRESSION: Incomplete healing of the intra-articular radial styloid fracture.
No bony bridging observed dust far. Severe degenerative change
centered at the first carpometacarpal joint.

## 2016-09-25 ENCOUNTER — Emergency Department
Admission: EM | Admit: 2016-09-25 | Discharge: 2016-09-25 | Disposition: A | Discharge status: Home / Self Care | Payer: Medicare Other | Source: Home / Self Care | Attending: Emergency Medicine | Admitting: Emergency Medicine

## 2016-09-25 ENCOUNTER — Encounter: Payer: Self-pay | Admitting: Emergency Medicine

## 2016-09-25 DIAGNOSIS — L219 Seborrheic dermatitis, unspecified: Secondary | ICD-10-CM | POA: Diagnosis not present

## 2016-09-25 DIAGNOSIS — L739 Follicular disorder, unspecified: Secondary | ICD-10-CM

## 2016-09-25 MED ORDER — DOXYCYCLINE HYCLATE 100 MG PO CAPS: 100.0000 mg | ORAL_CAPSULE | Freq: Two times a day (BID) | ORAL | 0 refills | Status: DC

## 2016-09-25 MED ORDER — TRIAMCINOLONE ACETONIDE 0.1 % EX CREA: TOPICAL_CREAM | CUTANEOUS | 0 refills | Status: DC

## 2016-09-25 NOTE — ED Provider Notes (Signed)
[name]    CSN: [id number] Arrival date & time: 09/25/16  1425     History   Chief Complaint Chief Complaint  Patient presents with  . Cellulitis    HPI [name] is a 82 y.o. male.   HPI Complains of red and fleshy colored painless bumps, some dried and scaly behind both external ears, for 2 months. Denies pain or itching fever chills or systemic symptoms. Although not itching, it feels irritated at times. Has not seen a physician for this problem. Has not tried any particular treatment for this. No radiation of the symptoms Past Medical History:  Diagnosis Date  . Anemia   . Antral gastritis 2013   EGD   . Atrial fibrillation ([place])   . CAD (coronary artery disease)    s/p CABG 1993 (L-LAD, S-RI, S-D1);   echo 1/10: EF 55%;    myoview 12/09: inf MI, no ischemia  . Candida esophagitis ([place]) 2013   EGD   . Family history of malignant neoplasm of gastrointestinal tract   . Folliculitis   . Hiatal hernia   . HTN (hypertension)   . Hyperlipidemia   . Irritable bladder   . Myocardial infarction   . Obesity   . Osteoarthritis     Patient Active Problem List   Diagnosis Date Noted  . Closed fracture of radial styloid 02/28/2016  . PCP NOTES >>>>> 10/11/2015  . Internal hemorrhoid 06/01/2014  . Carpal tunnel syndrome 02/15/2014  . Need for prophylactic vaccination and inoculation against influenza 08/27/2012  . Anemia 08/14/2012  . Annual physical exam 01/08/2012  . Atrial fibrillation ([place]) 03/29/2011  . CAD, ARTERY BYPASS GRAFT 06/19/2010  . BRADYCARDIA 06/19/2010  . Venous (peripheral) insufficiency 09/04/2009  . PALPITATIONS 07/07/2009  . OBESITY 07/04/2009  . Lower urinary tract symptoms (LUTS) 07/04/2009  . DJD (degenerative joint disease) 07/04/2009  . SLEEP DISORDER 07/04/2009  . Dyspepsia 02/28/2009  . HEMATOCHEZIA 02/07/2009  . DM II (diabetes mellitus, type II), controlled ([place]) 04/25/2008  . Hyperlipidemia 06/25/2007  .  Essential hypertension 06/25/2007  . CORONARY ARTERY DISEASE 06/25/2007    Past Surgical History:  Procedure Laterality Date  . CHOLECYSTECTOMY     laparoscopic '92  . CORONARY ARTERY BYPASS GRAFT     graft '92: LIMA-LAD, SVG - D2,R1, D1  . ENTEROSCOPY  08/22/2012   Procedure: ENTEROSCOPY;  Surgeon: Juanita Craver, MD;  Location: [facility];  Service: Endoscopy;  Laterality: N/A;  . KNEE SURGERY    . MOLE REMOVAL  2001 and 2008  . PILONIDAL CYST EXCISION         Home Medications    Prior to Admission medications   Medication Sig Start Date End Date Taking? Authorizing Provider  amLODipine (NORVASC) 5 MG tablet Take 1 tablet (5 mg total) by mouth daily. 04/12/16   Colon Branch, MD  atorvastatin (LIPITOR) 80 MG tablet Take 0.5 tablets (40 mg total) by mouth at bedtime. 02/28/16   Lelon Perla, MD  benazepril (LOTENSIN) 40 MG tablet Take 1 tablet (40 mg total) by mouth daily. 04/16/16   Colon Branch, MD  Cholecalciferol (VITAMIN D) 1000 UNITS capsule Take 1,000 Units by mouth daily.      Historical Provider, MD  Coenzyme Q10 (COQ10 PO) Take 1 capsule by mouth daily.    Historical Provider, MD  cyclobenzaprine (FLEXERIL) 5 MG tablet Take 1 at bedtime as needed for muscle relaxant. Caution: May cause drowsiness. 06/16/14   Jacqulyn Cane, MD  diphenhydramine-acetaminophen (  TYLENOL PM) 25-500 MG TABS tablet Take 2 tablets by mouth at bedtime.    Historical Provider, MD  doxycycline (VIBRAMYCIN) 100 MG capsule Take 1 capsule (100 mg total) by mouth 2 (two) times daily. 09/25/16   Jacqulyn Cane, MD  HYDROcodone-acetaminophen (NORCO/VICODIN) 5-325 MG tablet Take 1 tablet by mouth every 6 (six) hours as needed for severe pain. 12/23/15   Carmin Muskrat, MD  Melatonin 5 MG TABS Take 5 mg by mouth at bedtime.    Historical Provider, MD  metoprolol succinate (TOPROL-XL) 25 MG 24 hr tablet Take 1 tablet (25 mg total) by mouth daily. 08/05/16   Colon Branch, MD  Multiple Vitamin (MULTIVITAMIN WITH MINERALS)  TABS tablet Take 1 tablet by mouth daily.    Historical Provider, MD  nitroGLYCERIN (NITROSTAT) 0.4 MG SL tablet Place 1 tablet (0.4 mg total) under the tongue every 5 (five) minutes as needed for chest pain. 01/04/15   Lelon Perla, MD  pantoprazole (PROTONIX) 40 MG tablet Take 1 tablet (40 mg total) by mouth daily. 05/10/16   Colon Branch, MD  Probiotic Product (PROBIOTIC DAILY PO) Take 1 tablet by mouth daily.    Historical Provider, MD  rivaroxaban (XARELTO) 20 MG TABS tablet Take 1 tablet (20 mg total) by mouth daily with supper. Take 1 tablet by mouth daily with supper. 03/25/16   Lelon Perla, MD  tamsulosin (FLOMAX) 0.4 MG CAPS capsule Take 1 capsule (0.4 mg total) by mouth daily. 07/15/16   Colon Branch, MD  triamcinolone cream (KENALOG) 0.1 % Apply to affected areas 2 or 3 times daily as directed. 09/25/16   Jacqulyn Cane, MD  vitamin B-12 (CYANOCOBALAMIN) 1000 MCG tablet Take 1,000 mcg by mouth daily.    Historical Provider, MD    Family History Family History  Problem Relation Age of Onset  . Coronary artery disease Father     and brother  . Heart attack Father     and brother-fatal  . Stroke Father   . Breast cancer Mother   . Alzheimer's disease Mother   . Colon cancer Maternal Uncle   . Esophageal cancer Neg Hx   . Stomach cancer Neg Hx   . Rectal cancer Neg Hx   . Prostate cancer Neg Hx     Social History Social History  Substance Use Topics  . Smoking status: Former Smoker    Years: 16.00    Quit date: 08/14/1964  . Smokeless tobacco: Never Used     Comment: quit 1965  . Alcohol use No     Allergies   Review of patient's allergies indicates no known allergies.   Review of Systems Review of Systems  All other systems reviewed and are negative.    Physical Exam Triage Vital Signs ED Triage Vitals  Enc Vitals Group     BP 09/25/16 1445 97/61     Pulse Rate 09/25/16 1445 63     Resp --      Temp 09/25/16 1445 97.7 F (36.5 C)     Temp Source 09/25/16  1445 Oral     SpO2 09/25/16 1445 97 %     Weight 09/25/16 1447 217 lb (98.4 kg)     Height 09/25/16 1447 5\' 10"  (1.778 m)     Head Circumference --      Peak Flow --      Pain Score 09/25/16 1448 0     Pain Loc --      Pain Edu? --  Excl. in GC? --    No data found.   Updated Vital Signs BP 97/61 (BP Location: Left Arm)   Pulse 63   Temp 97.7 F (36.5 C) (Oral)   Ht 5\' 10"  (1.778 m)   Wt 217 lb (98.4 kg)   SpO2 97%   BMI 31.14 kg/m   Visual Acuity Right Eye Distance:   Left Eye Distance:   Bilateral Distance:    Right Eye Near:   Left Eye Near:    Bilateral Near:     Physical Exam  Constitutional: He is oriented to person, place, and time. He appears well-developed and well-nourished. No distress.  HENT:  Head: Normocephalic and atraumatic.  Eyes: Pupils are equal, round, and reactive to light. No scleral icterus.  Neck: Normal range of motion. Neck supple.  Cardiovascular: Normal rate and regular rhythm.   Pulmonary/Chest: Effort normal.  Abdominal: He exhibits no distension.  Neurological: He is alert and oriented to person, place, and time.  Skin: Skin is warm and dry.  Multiple fleshy and follicular and papulosquamous dried lesions occipital scalp, especially posterior to the external ears bilaterally. No fluctuance or drainage or red streaks.  Psychiatric: He has a normal mood and affect. His behavior is normal.     UC Treatments / Results  Labs (all labs ordered are listed, but only abnormal results are displayed) Labs Reviewed - No data to display  EKG  EKG Interpretation None       Radiology No results found.  Procedures Procedures (including critical care time)  Medications Ordered in UC Medications - No data to display   Initial Impression / Assessment and Plan / UC Course  I have reviewed the triage vital signs and the nursing notes.  Pertinent labs & imaging results that were available during my care of the patient were  reviewed by me and considered in my medical decision making (see chart for details).  Clinical Course      Final Clinical Impressions(s) / UC Diagnoses   Final diagnoses:  Seborrheic dermatitis of scalp  Folliculitis  Affecting posterior scalp behind each external ear at the hairline.  New Prescriptions Discharge Medication List as of 09/25/2016  2:52 PM    Doxycycline 100 mg twice a day 10 days Triamcinolone cream, 45 g, no refills. Apply to affected areas twice a day to 3 times a day. Other symptomatic care discussed. I urged him to see dermatologist if no better in 1 week, sooner if worse or new symptoms. Explained risks of not seeing a dermatologist. I explained it's possible that some of these lesions could possibly be BCC or other type of early skin cancer. Precautions discussed. Red flags discussed. Questions invited and answered. Patient voiced understanding and agreement.    Jacqulyn Cane, MD 09/30/16 [phone]

## 2016-09-25 NOTE — ED Triage Notes (Signed)
Red painless bumps behind ears x 2 months, denies pain, itching, fever, chills.

## 2016-09-30 DIAGNOSIS — L02818 Cutaneous abscess of other sites: Secondary | ICD-10-CM | POA: Diagnosis not present

## 2016-09-30 DIAGNOSIS — L700 Acne vulgaris: Secondary | ICD-10-CM | POA: Diagnosis not present

## 2016-10-15 ENCOUNTER — Ambulatory Visit (INDEPENDENT_AMBULATORY_CARE_PROVIDER_SITE_OTHER): Payer: Medicare Other | Admitting: Family Medicine

## 2016-10-15 ENCOUNTER — Encounter: Payer: Self-pay | Admitting: Family Medicine

## 2016-10-15 VITALS — BP 126/71 | HR 59 | Wt 221.0 lb

## 2016-10-15 DIAGNOSIS — M19072 Primary osteoarthritis, left ankle and foot: Secondary | ICD-10-CM

## 2016-10-15 DIAGNOSIS — M8949 Other hypertrophic osteoarthropathy, multiple sites: Secondary | ICD-10-CM

## 2016-10-15 DIAGNOSIS — M150 Primary generalized (osteo)arthritis: Secondary | ICD-10-CM

## 2016-10-15 DIAGNOSIS — M159 Polyosteoarthritis, unspecified: Secondary | ICD-10-CM

## 2016-10-15 MED ORDER — DICLOFENAC SODIUM 1 % TD GEL: 4.0000 g | Freq: Four times a day (QID) | TRANSDERMAL | 11 refills | Status: DC

## 2016-10-15 NOTE — Progress Notes (Signed)
[name] is a 82 y.o. male who presents to [facility]: [place] today for follow-up left ankle pain. Patient was seen a month ago for chronic left ankle pain thought to be due to DJD. He received a diagnostic and therapeutic interarticular steroid injection. He notes that the pain resolved immediately with the pain relief only lasted a few days. He notes continued pain and swelling. He's also been using a compressive ankle sleeve that does help with activity. Symptoms are moderate and do bother him. He thinks are not bad enough that he would like to consider either ankle fusion or total ankle replacement.   Past Medical History:  Diagnosis Date  . Anemia   . Antral gastritis 2013   EGD   . Atrial fibrillation ([place])   . CAD (coronary artery disease)    s/p CABG 1993 (L-LAD, S-RI, S-D1);   echo 1/10: EF 55%;    myoview 12/09: inf MI, no ischemia  . Candida esophagitis ([place]) 2013   EGD   . Family history of malignant neoplasm of gastrointestinal tract   . Folliculitis   . Hiatal hernia   . HTN (hypertension)   . Hyperlipidemia   . Irritable bladder   . Myocardial infarction   . Obesity   . Osteoarthritis    Past Surgical History:  Procedure Laterality Date  . CHOLECYSTECTOMY     laparoscopic '92  . CORONARY ARTERY BYPASS GRAFT     graft '92: LIMA-LAD, SVG - D2,R1, D1  . ENTEROSCOPY  08/22/2012   Procedure: ENTEROSCOPY;  Surgeon: Juanita Craver, MD;  Location: [facility];  Service: Endoscopy;  Laterality: N/A;  . KNEE SURGERY    . MOLE REMOVAL  2001 and 2008  . PILONIDAL CYST EXCISION     Social History  Substance Use Topics  . Smoking status: Former Smoker    Years: 16.00    Quit date: 08/14/1964  . Smokeless tobacco: Never Used     Comment: quit 1965  . Alcohol use No   family history includes Alzheimer's disease in his mother; Breast cancer in his mother; Colon  cancer in his maternal uncle; Coronary artery disease in his father; Heart attack in his father; Stroke in his father.  ROS as above:  Medications: Current Outpatient Prescriptions  Medication Sig Dispense Refill  . amLODipine (NORVASC) 5 MG tablet Take 1 tablet (5 mg total) by mouth daily. 30 tablet 6  . atorvastatin (LIPITOR) 80 MG tablet Take 0.5 tablets (40 mg total) by mouth at bedtime. 90 tablet 3  . benazepril (LOTENSIN) 40 MG tablet Take 1 tablet (40 mg total) by mouth daily. 90 tablet 2  . Cholecalciferol (VITAMIN D) 1000 UNITS capsule Take 1,000 Units by mouth daily.      . Coenzyme Q10 (COQ10 PO) Take 1 capsule by mouth daily.    . cyclobenzaprine (FLEXERIL) 5 MG tablet Take 1 at bedtime as needed for muscle relaxant. Caution: May cause drowsiness. 10 tablet 0  . diphenhydramine-acetaminophen (TYLENOL PM) 25-500 MG TABS tablet Take 2 tablets by mouth at bedtime.    Marland Kitchen doxycycline (VIBRAMYCIN) 100 MG capsule Take 1 capsule (100 mg total) by mouth 2 (two) times daily. 20 capsule 0  . HYDROcodone-acetaminophen (NORCO/VICODIN) 5-325 MG tablet Take 1 tablet by mouth every 6 (six) hours as needed for severe pain. 10 tablet 0  . Melatonin 5 MG TABS Take 5 mg by mouth at bedtime.    . metoprolol  succinate (TOPROL-XL) 25 MG 24 hr tablet Take 1 tablet (25 mg total) by mouth daily. 30 tablet 5  . Multiple Vitamin (MULTIVITAMIN WITH MINERALS) TABS tablet Take 1 tablet by mouth daily.    . nitroGLYCERIN (NITROSTAT) 0.4 MG SL tablet Place 1 tablet (0.4 mg total) under the tongue every 5 (five) minutes as needed for chest pain. 25 tablet 3  . pantoprazole (PROTONIX) 40 MG tablet Take 1 tablet (40 mg total) by mouth daily. 90 tablet 2  . Probiotic Product (PROBIOTIC DAILY PO) Take 1 tablet by mouth daily.    . rivaroxaban (XARELTO) 20 MG TABS tablet Take 1 tablet (20 mg total) by mouth daily with supper. Take 1 tablet by mouth daily with supper. 90 tablet 1  . tamsulosin (FLOMAX) 0.4 MG CAPS capsule  Take 1 capsule (0.4 mg total) by mouth daily. 30 capsule 5  . triamcinolone cream (KENALOG) 0.1 % Apply to affected areas 2 or 3 times daily as directed. 45 g 0  . vitamin B-12 (CYANOCOBALAMIN) 1000 MCG tablet Take 1,000 mcg by mouth daily.    . diclofenac sodium (VOLTAREN) 1 % GEL Apply 4 g topically 4 (four) times daily. To affected joint. 100 g 11   No current facility-administered medications for this visit.    No Known Allergies  Health Maintenance Health Maintenance  Topic Date Due  . OPHTHALMOLOGY EXAM  11/11/1941  . FOOT EXAM  10/10/2016  . HEMOGLOBIN A1C  10/13/2016  . TETANUS/TDAP  02/13/2025  . INFLUENZA VACCINE  Completed  . ZOSTAVAX  Completed  . PNA vac Low Risk Adult  Completed     Exam:  BP 126/71   Pulse (!) 59   Wt 221 lb (100.2 kg)   BMI 31.71 kg/m  Gen: Well NAD Left ankle: Moderate effusion. Normal motion pulses Refill sensation. Mildly tender at the ATFL area.  Left ankle x-ray dated October 2017 showing DJD reviewed   No results found for this or any previous visit (from the past 5 hour(s)). No results found.    Assessment and Plan: 82 y.o. male with  Left ankle pain due to DJD. Recent steroid injection was not very helpful at all. Plan for conservative management with compression exercise and activity modification as well as topical NSAIDs. Will use Voltaren gel. Follow-up in a few months if not better.   No orders of the defined types were placed in this encounter.   Discussed warning signs or symptoms. Please see discharge instructions. Patient expresses understanding.  CC: Kathlene November, MD

## 2016-10-15 NOTE — Patient Instructions (Signed)
Thank you for coming in today. Use the voltaren gel as needed.  Continue the sleeve.  Return as needed.   Arthritis Arthritis is a term that is commonly used to refer to joint pain or joint disease. There are more than 100 types of arthritis. CAUSES The most common cause of this condition is wear and tear of a joint. Other causes include:  Gout.  Inflammation of a joint.  An infection of a joint.  Sprains and other injuries near the joint.  A drug reaction or allergic reaction. In some cases, the cause may not be known. SYMPTOMS The main symptom of this condition is pain in the joint with movement. Other symptoms include:  Redness, swelling, or stiffness at a joint.  Warmth coming from the joint.  Fever.  Overall feeling of illness. DIAGNOSIS This condition may be diagnosed with a physical exam and tests, including:  Blood tests.  Urine tests.  Imaging tests, such as MRI, X-rays, or a CT scan. Sometimes, fluid is removed from a joint for testing. TREATMENT Treatment for this condition may involve:  Treatment of the cause, if it is known.  Rest.  Raising (elevating) the joint.  Applying cold or hot packs to the joint.  Medicines to improve symptoms and reduce inflammation.  Injections of a steroid such as cortisone into the joint to help reduce pain and inflammation. Depending on the cause of your arthritis, you may need to make lifestyle changes to reduce stress on your joint. These changes may include exercising more and losing weight. HOME CARE INSTRUCTIONS Medicines  Take over-the-counter and prescription medicines only as told by your health care provider.  Do not take aspirin to relieve pain if gout is suspected. Activities  Rest your joint if told by your health care provider. Rest is important when your disease is active and your joint feels painful, swollen, or stiff.  Avoid activities that make the pain worse. It is important to balance  activity with rest.  Exercise your joint regularly with range-of-motion exercises as told by your health care provider. Try doing low-impact exercise, such as:  Swimming.  Water aerobics.  Biking.  Walking. Joint Care  If your joint is swollen, keep it elevated if told by your health care provider.  If your joint feels stiff in the morning, try taking a warm shower.  If directed, apply heat to the joint. If you have diabetes, do not apply heat without permission from your health care provider.  Put a towel between the joint and the hot pack or heating pad.  Leave the heat on the area for 20-30 minutes.  If directed, apply ice to the joint:  Put ice in a plastic bag.  Place a towel between your skin and the bag.  Leave the ice on for 20 minutes, 2-3 times per day.  Keep all follow-up visits as told by your health care provider. This is important. SEEK MEDICAL CARE IF:  The pain gets worse.  You have a fever. SEEK IMMEDIATE MEDICAL CARE IF:  You develop severe joint pain, swelling, or redness.  Many joints become painful and swollen.  You develop severe back pain.  You develop severe weakness in your leg.  You cannot control your bladder or bowels.   This information is not intended to replace advice given to you by your health care provider. Make sure you discuss any questions you have with your health care provider.   Document Released: 01/02/2005 Document Revised: 08/16/2015 Document Reviewed: 02/20/2015  Chartered certified accountant Patient Education [employer].

## 2016-10-16 ENCOUNTER — Telehealth: Payer: Self-pay | Admitting: *Deleted

## 2016-10-16 NOTE — Telephone Encounter (Signed)
PA approved for diclofenac gel. Through 12/08/2017.left message on patient's vm and pharm vm

## 2016-10-22 DIAGNOSIS — H2513 Age-related nuclear cataract, bilateral: Secondary | ICD-10-CM | POA: Diagnosis not present

## 2016-10-22 DIAGNOSIS — H40003 Preglaucoma, unspecified, bilateral: Secondary | ICD-10-CM | POA: Diagnosis not present

## 2016-10-22 DIAGNOSIS — H43813 Vitreous degeneration, bilateral: Secondary | ICD-10-CM | POA: Diagnosis not present

## 2016-10-22 DIAGNOSIS — H353131 Nonexudative age-related macular degeneration, bilateral, early dry stage: Secondary | ICD-10-CM | POA: Diagnosis not present

## 2016-10-26 ENCOUNTER — Other Ambulatory Visit: Payer: Self-pay | Admitting: Internal Medicine

## 2016-11-16 DIAGNOSIS — R0600 Dyspnea, unspecified: Secondary | ICD-10-CM | POA: Diagnosis not present

## 2016-11-16 DIAGNOSIS — R791 Abnormal coagulation profile: Secondary | ICD-10-CM | POA: Diagnosis not present

## 2016-11-16 DIAGNOSIS — R233 Spontaneous ecchymoses: Secondary | ICD-10-CM | POA: Diagnosis not present

## 2016-11-16 DIAGNOSIS — Z87891 Personal history of nicotine dependence: Secondary | ICD-10-CM | POA: Diagnosis not present

## 2016-11-16 DIAGNOSIS — I482 Chronic atrial fibrillation: Secondary | ICD-10-CM | POA: Diagnosis not present

## 2016-11-16 DIAGNOSIS — I10 Essential (primary) hypertension: Secondary | ICD-10-CM | POA: Diagnosis not present

## 2016-11-16 DIAGNOSIS — K219 Gastro-esophageal reflux disease without esophagitis: Secondary | ICD-10-CM | POA: Diagnosis not present

## 2016-11-16 DIAGNOSIS — R21 Rash and other nonspecific skin eruption: Secondary | ICD-10-CM | POA: Diagnosis not present

## 2016-11-16 DIAGNOSIS — Z7901 Long term (current) use of anticoagulants: Secondary | ICD-10-CM | POA: Diagnosis not present

## 2016-11-16 DIAGNOSIS — E785 Hyperlipidemia, unspecified: Secondary | ICD-10-CM | POA: Diagnosis not present

## 2016-11-16 DIAGNOSIS — R944 Abnormal results of kidney function studies: Secondary | ICD-10-CM | POA: Diagnosis not present

## 2016-11-16 DIAGNOSIS — R600 Localized edema: Secondary | ICD-10-CM | POA: Diagnosis not present

## 2016-11-18 ENCOUNTER — Telehealth: Payer: Self-pay | Admitting: Cardiology

## 2016-11-18 NOTE — Progress Notes (Signed)
HPI: FU coronary artery disease and atrial fibrillation. Patient is status post coronary artery bypassing graft in 1993 with a LIMA to the LAD, saphenous vein graft to the ramus intermedius and saphenous vein graft to the first diagonal. CT of the abdomen in January of 2009 showed no renal artery stenosis. There was no aortic aneurysm. Last echocardiogram in April of 2012 showed an ejection fraction of 45-50% and mild left atrial enlargement. Nuclear study in June 2014 showed scar in the mid/apical inferior wall and apex. There was a small area of ischemia in the base to mid anterior wall. The study was unchanged compared to May of 2012. The study was not gated. We have treated medically. Patient seen recently in the emergency room and was placed on diuretics for increased edema. Prior to the ER visit he had noticed increased dyspnea on exertion and bilateral lower extremity edema. His dyspnea has resolved. His lower extremity edema is also improving. He has lost 10 pounds on Lasix 20 mg daily. No chest pain, palpitations or syncope.  Current Outpatient Prescriptions  Medication Sig Dispense Refill  . amLODipine (NORVASC) 5 MG tablet Take 1 tablet (5 mg total) by mouth daily. 30 tablet 5  . atorvastatin (LIPITOR) 80 MG tablet Take 0.5 tablets (40 mg total) by mouth at bedtime. 90 tablet 3  . benazepril (LOTENSIN) 40 MG tablet Take 1 tablet (40 mg total) by mouth daily. 90 tablet 2  . Cholecalciferol (VITAMIN D) 1000 UNITS capsule Take 1,000 Units by mouth daily.      . Coenzyme Q10 (COQ10 PO) Take 1 capsule by mouth daily.    . cyclobenzaprine (FLEXERIL) 5 MG tablet Take 1 at bedtime as needed for muscle relaxant. Caution: May cause drowsiness. 10 tablet 0  . diclofenac sodium (VOLTAREN) 1 % GEL Apply 4 g topically 4 (four) times daily. To affected joint. 100 g 11  . diphenhydramine-acetaminophen (TYLENOL PM) 25-500 MG TABS tablet Take 2 tablets by mouth at bedtime.    . Melatonin 5 MG TABS  Take 5 mg by mouth at bedtime.    . metoprolol succinate (TOPROL-XL) 25 MG 24 hr tablet Take 1 tablet (25 mg total) by mouth daily. 30 tablet 5  . Multiple Vitamin (MULTIVITAMIN WITH MINERALS) TABS tablet Take 1 tablet by mouth daily.    . nitroGLYCERIN (NITROSTAT) 0.4 MG SL tablet Place 1 tablet (0.4 mg total) under the tongue every 5 (five) minutes as needed for chest pain. 25 tablet 3  . pantoprazole (PROTONIX) 40 MG tablet Take 1 tablet (40 mg total) by mouth daily. 90 tablet 2  . Probiotic Product (PROBIOTIC DAILY PO) Take 1 tablet by mouth daily.    . rivaroxaban (XARELTO) 20 MG TABS tablet Take 1 tablet (20 mg total) by mouth daily with supper. Take 1 tablet by mouth daily with supper. 90 tablet 1  . tamsulosin (FLOMAX) 0.4 MG CAPS capsule Take 1 capsule (0.4 mg total) by mouth daily. 30 capsule 5  . vitamin B-12 (CYANOCOBALAMIN) 1000 MCG tablet Take 1,000 mcg by mouth daily.     No current facility-administered medications for this visit.      Past Medical History:  Diagnosis Date  . Anemia   . Antral gastritis 2013   EGD   . Atrial fibrillation ([place])   . CAD (coronary artery disease)    s/p CABG 1993 (L-LAD, S-RI, S-D1);   echo 1/10: EF 55%;    myoview 12/09: inf MI, no ischemia  .  Candida esophagitis ([place]) 2013   EGD   . Family history of malignant neoplasm of gastrointestinal tract   . Folliculitis   . Hiatal hernia   . HTN (hypertension)   . Hyperlipidemia   . Irritable bladder   . Myocardial infarction   . Obesity   . Osteoarthritis     Past Surgical History:  Procedure Laterality Date  . CHOLECYSTECTOMY     laparoscopic '92  . CORONARY ARTERY BYPASS GRAFT     graft '92: LIMA-LAD, SVG - D2,R1, D1  . ENTEROSCOPY  08/22/2012   Procedure: ENTEROSCOPY;  Surgeon: Juanita Craver, MD;  Location: [facility];  Service: Endoscopy;  Laterality: N/A;  . KNEE SURGERY    . MOLE REMOVAL  2001 and 2008  . PILONIDAL CYST EXCISION      Social History   Social History  .  Marital status: Widowed    Spouse name: N/A  . Number of children: 1  . Years of education: N/A   Occupational History  . Retired Retired   Social History Main Topics  . Smoking status: Former Smoker    Years: 16.00    Quit date: 08/14/1964  . Smokeless tobacco: Never Used     Comment: quit 1965  . Alcohol use No  . Drug use: No  . Sexual activity: Not on file   Other Topics Concern  . 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 Married '57- widowed 02/17/23. 1 son -'69- died '39 MVA.    Lives by himself   No immediate family. Emergency contact : Daron Offer [phone] (friend)    Family History  Problem Relation Age of Onset  . Coronary artery disease Father     and brother  . Heart attack Father     and brother-fatal  . Stroke Father   . Breast cancer Mother   . Alzheimer's disease Mother   . Colon cancer Maternal Uncle   . Esophageal cancer Neg Hx   . Stomach cancer Neg Hx   . Rectal cancer Neg Hx   . Prostate cancer Neg Hx     ROS: no fevers or chills, productive cough, hemoptysis, dysphasia, odynophagia, melena, hematochezia, dysuria, hematuria, rash, seizure activity, orthopnea, PND, claudication. Remaining systems are negative.  Physical Exam: Well-developed well-nourished in no acute distress.  Skin is warm and dry.  HEENT is normal.  Neck is supple.  Chest is clear to auscultation with normal expansion.  Cardiovascular exam is irregular Abdominal exam nontender or distended. No masses palpated. Extremities show 1+ edema. neuro grossly intact  ECG-Atrial fibrillation at a rate of 66. No ST changes.  A/P  1 Permanent atrial fibrillation-continue beta blocker for rate control. Continue xarelto.  2 coronary artery disease-continue statin. No aspirin given need for anticoagulation.  3 hypertension-blood pressure controlled. Continue present medications.  4 hyperlipidemia-continue statin.  5 chronic combined systolic/diastolic  congestive heart failure-patient's volume status is improving following recent initiation of Lasix 20 mg daily. We will continue with that dose. In 5 days we will check potassium and renal function. I have instructed him on the importance of low sodium diet. He weighs himself daily and will take an additional 20 mg of Lasix for weight gain of 2-3 pounds. Repeat echocardiogram.  Kirk Ruths, MD

## 2016-11-18 NOTE — Telephone Encounter (Signed)
Spoke with pt, he had swelling in both legs and went to the er. He was given furosemide and told to f/u with [place]. He denies SOB. Follow up scheduled with dr Stanford Breed in the [place] office.

## 2016-11-20 ENCOUNTER — Ambulatory Visit (INDEPENDENT_AMBULATORY_CARE_PROVIDER_SITE_OTHER): Payer: Medicare Other | Admitting: Cardiology

## 2016-11-20 ENCOUNTER — Encounter: Payer: Self-pay | Admitting: Cardiology

## 2016-11-20 VITALS — BP 111/68 | HR 66 | Ht 70.0 in | Wt 222.4 lb

## 2016-11-20 DIAGNOSIS — E7800 Pure hypercholesterolemia, unspecified: Secondary | ICD-10-CM

## 2016-11-20 DIAGNOSIS — I10 Essential (primary) hypertension: Secondary | ICD-10-CM | POA: Diagnosis not present

## 2016-11-20 DIAGNOSIS — I2510 Atherosclerotic heart disease of native coronary artery without angina pectoris: Secondary | ICD-10-CM | POA: Diagnosis not present

## 2016-11-20 DIAGNOSIS — R609 Edema, unspecified: Secondary | ICD-10-CM

## 2016-11-20 MED ORDER — FUROSEMIDE 20 MG PO TABS: 20.0000 mg | ORAL_TABLET | Freq: Every day | ORAL | 3 refills | Status: DC

## 2016-11-20 NOTE — Patient Instructions (Signed)
Medication Instructions:   NO CHANGE  Labwork:  Your physician recommends that you return for lab work Monday NEXT WEEK  Testing/Procedures:  Your physician has requested that you have an echocardiogram. Echocardiography is a painless test that uses sound waves to create images of your heart. It provides your doctor with information about the size and shape of your heart and how well your heart's chambers and valves are working. This procedure takes approximately one hour. There are no restrictions for this procedure.    Follow-Up:  Your physician recommends that you schedule a follow-up appointment in: [name]

## 2016-11-20 NOTE — Addendum Note (Signed)
Addended by: Cristopher Estimable on: 11/20/2016 03:45 PM   Modules accepted: Orders

## 2016-11-21 ENCOUNTER — Ambulatory Visit: Payer: Medicare Other | Admitting: Cardiology

## 2016-11-25 DIAGNOSIS — R609 Edema, unspecified: Secondary | ICD-10-CM | POA: Diagnosis not present

## 2016-11-26 LAB — BASIC METABOLIC PANEL
BUN: 25 mg/dL (ref 7–25)
CALCIUM: 9.2 mg/dL (ref 8.6–10.3)
CO2: 26 mmol/L (ref 20–31)
Chloride: 107 mmol/L (ref 98–110)
Creat: 1.29 mg/dL — ABNORMAL HIGH (ref 0.70–1.11)
GLUCOSE: 107 mg/dL — AB (ref 65–99)
POTASSIUM: 4.4 mmol/L (ref 3.5–5.3)
SODIUM: 139 mmol/L (ref 135–146)

## 2016-11-28 ENCOUNTER — Ambulatory Visit (HOSPITAL_BASED_OUTPATIENT_CLINIC_OR_DEPARTMENT_OTHER)
Admission: RE | Admit: 2016-11-28 | Discharge: 2016-11-28 | Disposition: A | Discharge status: Home / Self Care | Payer: Medicare Other | Source: Ambulatory Visit | Attending: Cardiology | Admitting: Cardiology

## 2016-11-28 DIAGNOSIS — I361 Nonrheumatic tricuspid (valve) insufficiency: Secondary | ICD-10-CM | POA: Diagnosis not present

## 2016-11-28 DIAGNOSIS — R609 Edema, unspecified: Secondary | ICD-10-CM | POA: Diagnosis not present

## 2016-11-28 DIAGNOSIS — I509 Heart failure, unspecified: Secondary | ICD-10-CM | POA: Diagnosis not present

## 2016-11-28 DIAGNOSIS — I340 Nonrheumatic mitral (valve) insufficiency: Secondary | ICD-10-CM | POA: Insufficient documentation

## 2016-11-28 DIAGNOSIS — E785 Hyperlipidemia, unspecified: Secondary | ICD-10-CM | POA: Insufficient documentation

## 2016-11-28 DIAGNOSIS — I110 Hypertensive heart disease with heart failure: Secondary | ICD-10-CM | POA: Diagnosis not present

## 2016-12-16 ENCOUNTER — Encounter: Payer: Self-pay | Admitting: Internal Medicine

## 2016-12-16 ENCOUNTER — Ambulatory Visit (INDEPENDENT_AMBULATORY_CARE_PROVIDER_SITE_OTHER): Payer: Medicare Other | Admitting: Internal Medicine

## 2016-12-16 VITALS — BP 126/72 | HR 68 | Temp 98.2°F | Resp 14 | Ht 70.0 in | Wt 223.5 lb

## 2016-12-16 DIAGNOSIS — I5042 Chronic combined systolic (congestive) and diastolic (congestive) heart failure: Secondary | ICD-10-CM | POA: Diagnosis not present

## 2016-12-16 DIAGNOSIS — E1159 Type 2 diabetes mellitus with other circulatory complications: Secondary | ICD-10-CM | POA: Diagnosis not present

## 2016-12-16 DIAGNOSIS — D649 Anemia, unspecified: Secondary | ICD-10-CM

## 2016-12-16 DIAGNOSIS — Z09 Encounter for follow-up examination after completed treatment for conditions other than malignant neoplasm: Secondary | ICD-10-CM | POA: Diagnosis not present

## 2016-12-16 LAB — HEMOGLOBIN A1C: HEMOGLOBIN A1C: 6.2 % (ref 4.6–6.5)

## 2016-12-16 LAB — CBC WITH DIFFERENTIAL/PLATELET
BASOS ABS: 0 10*3/uL (ref 0.0–0.1)
Basophils Relative: 0.4 % (ref 0.0–3.0)
EOS ABS: 0.2 10*3/uL (ref 0.0–0.7)
Eosinophils Relative: 2.5 % (ref 0.0–5.0)
HCT: 40 % (ref 39.0–52.0)
Hemoglobin: 13.8 g/dL (ref 13.0–17.0)
LYMPHS ABS: 1.8 10*3/uL (ref 0.7–4.0)
Lymphocytes Relative: 27 % (ref 12.0–46.0)
MCHC: 34.6 g/dL (ref 30.0–36.0)
MCV: 94.8 fl (ref 78.0–100.0)
MONO ABS: 0.8 10*3/uL (ref 0.1–1.0)
MONOS PCT: 11.9 % (ref 3.0–12.0)
NEUTROS ABS: 3.9 10*3/uL (ref 1.4–7.7)
NEUTROS PCT: 58.2 % (ref 43.0–77.0)
Platelets: 171 10*3/uL (ref 150.0–400.0)
RBC: 4.22 Mil/uL (ref 4.22–5.81)
RDW: 13.9 % (ref 11.5–15.5)
WBC: 6.6 10*3/uL (ref 4.0–10.5)

## 2016-12-16 MED ORDER — METOPROLOL SUCCINATE ER 25 MG PO TB24: 25.0000 mg | ORAL_TABLET | Freq: Every day | ORAL | 1 refills | Status: DC

## 2016-12-16 NOTE — Progress Notes (Signed)
Pre visit review using our clinic review tool, if applicable. No additional management support is needed unless otherwise documented below in the visit note. 

## 2016-12-16 NOTE — Progress Notes (Addendum)
Subjective:    Patient ID: [name], male    DOB: 06-02-1931, 83 y.o.   MRN: [id number]  DOS:  12/16/2016 Type of visit - description : Routine office visit Interval history: In  December, developed severe LE Swelling, went to the ER and subsequently saw his heart doctor. Had a echocardiogram. Records reviewed. Reports the swelling is better, in the last couple of weeks weight home has been about the same.  HTN: Good compliance of medication, ambulatory BPs  Wt Readings from Last 3 Encounters:  12/16/16 223 lb 8 oz (101.4 kg)  11/20/16 222 lb 6.4 oz (100.9 kg)  10/15/16 221 lb (100.2 kg)    Review of Systems Denies chest pain or difficulty breathing No orthopnea No nausea, vomiting, diarrhea  Past Medical History:  Diagnosis Date  . Anemia   . Antral gastritis 2013   EGD   . Atrial fibrillation ([place])   . CAD (coronary artery disease)    s/p CABG 1993 (L-LAD, S-RI, S-D1);   echo 1/10: EF 55%;    myoview 12/09: inf MI, no ischemia  . Candida esophagitis ([place]) 2013   EGD   . Family history of malignant neoplasm of gastrointestinal tract   . Folliculitis   . Hiatal hernia   . HTN (hypertension)   . Hyperlipidemia   . Irritable bladder   . Myocardial infarction   . Obesity   . Osteoarthritis     Past Surgical History:  Procedure Laterality Date  . CHOLECYSTECTOMY     laparoscopic '92  . CORONARY ARTERY BYPASS GRAFT     graft '92: LIMA-LAD, SVG - D2,R1, D1  . ENTEROSCOPY  08/22/2012   Procedure: ENTEROSCOPY;  Surgeon: Juanita Craver, MD;  Location: [facility];  Service: Endoscopy;  Laterality: N/A;  . KNEE SURGERY    . MOLE REMOVAL  2001 and 2008  . PILONIDAL CYST EXCISION      Social History   Social History  . Marital status: Widowed    Spouse name: N/A  . Number of children: 1  . Years of education: N/A   Occupational History  . Retired Retired   Social History Main Topics  . Smoking status: Former Smoker    Years: 16.00    Quit date: 08/14/1964  .  Smokeless tobacco: Never Used     Comment: quit 1965  . Alcohol use No  . Drug use: No  . Sexual activity: Not on file   Other Topics Concern  . 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 Married '57- widowed 2023-02-23. 1 son -'45- died '23 MVA.    Lives by himself   No immediate family. Emergency contact : Daron Offer [phone] (friend)      Allergies as of 12/16/2016   No Known Allergies     Medication List       Accurate as of 12/16/16 11:59 PM. Always use your most recent med list.          amLODipine 5 MG tablet Commonly known as:  NORVASC Take 1 tablet (5 mg total) by mouth daily.   atorvastatin 80 MG tablet Commonly known as:  LIPITOR Take 0.5 tablets (40 mg total) by mouth at bedtime.   benazepril 40 MG tablet Commonly known as:  LOTENSIN Take 1 tablet (40 mg total) by mouth daily.   COQ10 PO Take 1 capsule by mouth daily.   cyclobenzaprine 5 MG tablet Commonly known as:  FLEXERIL Take  1 at bedtime as needed for muscle relaxant. Caution: May cause drowsiness.   diclofenac sodium 1 % Gel Commonly known as:  VOLTAREN Apply 4 g topically 4 (four) times daily. To affected joint.   diphenhydramine-acetaminophen 25-500 MG Tabs tablet Commonly known as:  TYLENOL PM Take 2 tablets by mouth at bedtime.   furosemide 20 MG tablet Commonly known as:  LASIX Take 1 tablet (20 mg total) by mouth daily.   Melatonin 5 MG Tabs Take 5 mg by mouth at bedtime.   metoprolol succinate 25 MG 24 hr tablet Commonly known as:  TOPROL-XL Take 1 tablet (25 mg total) by mouth daily.   multivitamin with minerals Tabs tablet Take 1 tablet by mouth daily.   nitroGLYCERIN 0.4 MG SL tablet Commonly known as:  NITROSTAT Place 1 tablet (0.4 mg total) under the tongue every 5 (five) minutes as needed for chest pain.   pantoprazole 40 MG tablet Commonly known as:  PROTONIX Take 1 tablet (40 mg total) by mouth daily.   PROBIOTIC DAILY PO Take 1 tablet by mouth  daily.   rivaroxaban 20 MG Tabs tablet Commonly known as:  XARELTO Take 1 tablet (20 mg total) by mouth daily with supper. Take 1 tablet by mouth daily with supper.   tamsulosin 0.4 MG Caps capsule Commonly known as:  FLOMAX Take 1 capsule (0.4 mg total) by mouth daily.   vitamin B-12 1000 MCG tablet Commonly known as:  CYANOCOBALAMIN Take 1,000 mcg by mouth daily.   Vitamin D 1000 units capsule Take 1,000 Units by mouth daily.          Objective:   Physical Exam BP 126/72 (BP Location: Left Arm, Patient Position: Sitting, Cuff Size: Normal)   Pulse 68   Temp 98.2 F (36.8 C) (Oral)   Resp 14   Ht 5\' 10"  (1.778 m)   Wt 223 lb 8 oz (101.4 kg)   SpO2 97%   BMI 32.07 kg/m  General:   Well developed, well nourished . NAD.  HEENT:  Normocephalic . Face symmetric, atraumatic Lungs:  CTA B Normal respiratory effort, no intercostal retractions, no accessory muscle use. Heart: Irregularly irregular,  no murmur.  +/+++ symmetric pretibial edema bilaterally  Skin: Not pale. Not jaundice Neurologic:  alert & oriented X3.  Speech normal, gait appropriate for age and unassisted Psych--  Cognition and judgment appear intact.  Cooperative with normal attention span and concentration.  Behavior appropriate. No anxious or depressed appearing.      Assessment & Plan:    Assessment >  DM --diet control, no neuropathy HTN Hyperlipidemia CV: --CAD, CABG 1993, Myoview 2009 no ischemia. --Atrial fibrillation -- rate control, xarelto -- chronic combined systolic/diastolic congestive heart failure Venous insufficiency, mild LE edema L>>R  LUTS DJD GI: --Candida esophagitis, antral gastritis --->  2013 per EGD --HH  PLAN: Labs from 11/16/2016 at the  ER: BNP 665, creatinine 1.4, potassium 4.1, hemoglobin 12.4. Subsequently, a BMP showed a creat of 1.2. DM: Diet control, check a A1c   HTN: Continue with metoprolol, Lotensin and amlodipine. Recently Lasix was added due to  CHF Chronic systolic diastolic CHF: Last echo [id number], EF 45%. Still has some edema in the lower extremity, per our scales weight has not improved much. Continue Lasix 20 mg once daily, for the next 3 days take 40 mg daily. Continue daily weights, notify cardiology if weight increases. Watch salt intake. Mild anemia noted in labs  at the ER: Check labs RTC 04-2017, CPX  FTF > 25 min due to chart review

## 2016-12-16 NOTE — Patient Instructions (Addendum)
Go to the lab today for blood work   For the next 3 days, takes Lasix 20 mg 2 tablets every morning  Monitor  your weight every day  If you gain more than 3 pounds from this point on, please call your heart doctor.  Next visit with me 04/2017, CPX please make an appointment

## 2016-12-17 DIAGNOSIS — I509 Heart failure, unspecified: Secondary | ICD-10-CM | POA: Insufficient documentation

## 2016-12-17 NOTE — Assessment & Plan Note (Addendum)
Labs from 11/16/2016 at the  ER: BNP 665, creatinine 1.4, potassium 4.1, hemoglobin 12.4. Subsequently, a BMP showed a creat of 1.2. DM: Diet control, check a A1c   HTN: Continue with metoprolol, Lotensin and amlodipine. Recently Lasix was added due to CHF Chronic systolic diastolic CHF: Last echo [id number], EF 45%. Still has some edema in the lower extremity, per our scales weight has not improved much. Continue Lasix 20 mg once daily, for the next 3 days take 40 mg daily. Continue daily weights, notify cardiology if weight increases. Watch salt intake. Mild anemia noted in labs  at the ER: Check labs RTC 04-2017, CPX

## 2017-01-10 ENCOUNTER — Telehealth: Payer: Self-pay | Admitting: Internal Medicine

## 2017-01-10 MED ORDER — BENAZEPRIL HCL 40 MG PO TABS: 40.0000 mg | ORAL_TABLET | Freq: Every day | ORAL | 1 refills | Status: DC

## 2017-01-10 MED ORDER — TAMSULOSIN HCL 0.4 MG PO CAPS: 0.4000 mg | ORAL_CAPSULE | Freq: Every day | ORAL | 1 refills | Status: DC

## 2017-01-10 NOTE — Telephone Encounter (Signed)
Rxs sent

## 2017-01-10 NOTE — Telephone Encounter (Signed)
Relation to [id number] Call back [phone] Pharmacy: [facility], [place] [id number] (Phone) [phone] (Fax)     Reason for call:  Patient requesting a tamsulosin (FLOMAX) 0.4 MG CAPS capsule, benazepril (LOTENSIN) 40 MG tablet

## 2017-01-16 ENCOUNTER — Encounter: Payer: Self-pay | Admitting: Family Medicine

## 2017-01-16 ENCOUNTER — Ambulatory Visit (INDEPENDENT_AMBULATORY_CARE_PROVIDER_SITE_OTHER): Payer: Medicare Other | Admitting: Family Medicine

## 2017-01-16 VITALS — BP 130/65 | HR 61 | Temp 97.3°F | Resp 16 | Wt 226.0 lb

## 2017-01-16 DIAGNOSIS — M19072 Primary osteoarthritis, left ankle and foot: Secondary | ICD-10-CM

## 2017-01-16 NOTE — Progress Notes (Signed)
Pt here for follow up left ankle pain.  Pain scale 4 today.  Stated that he takes a pain pill when needed for the discomfort.

## 2017-01-16 NOTE — Progress Notes (Signed)
[name] is a 83 y.o. male who presents to [place] today for follow up ankle arthritis.  He has had pain in his ankle for the past year that worsens with activity. He got a steroid shot at his last visit 3 months ago that transiently improved the pain but did not last. Diclofenac gel did not help. He has still been walking and playing golf. Pain is worst in the lateral ankle but is also located in the posterolateral and medial ankle. He does not take any pain medications on a regular basis. He also endorses worsening left ankle edema towards the end of the day - has CHF and is currently on lasix and has tried compression stockings but feels like they cut into his skin.   Past Medical History:  Diagnosis Date  . Anemia   . Antral gastritis 2013   EGD   . Atrial fibrillation ([place])   . CAD (coronary artery disease)    s/p CABG 1993 (L-LAD, S-RI, S-D1);   echo 1/10: EF 55%;    myoview 12/09: inf MI, no ischemia  . Candida esophagitis ([place]) 2013   EGD   . Family history of malignant neoplasm of gastrointestinal tract   . Folliculitis   . Hiatal hernia   . HTN (hypertension)   . Hyperlipidemia   . Irritable bladder   . Myocardial infarction   . Obesity   . Osteoarthritis    Past Surgical History:  Procedure Laterality Date  . CHOLECYSTECTOMY     laparoscopic '92  . CORONARY ARTERY BYPASS GRAFT     graft '92: LIMA-LAD, SVG - D2,R1, D1  . ENTEROSCOPY  08/22/2012   Procedure: ENTEROSCOPY;  Surgeon: Juanita Craver, MD;  Location: [facility];  Service: Endoscopy;  Laterality: N/A;  . KNEE SURGERY    . MOLE REMOVAL  2001 and 2008  . PILONIDAL CYST EXCISION     Social History  Substance Use Topics  . Smoking status: Former Smoker    Years: 16.00    Quit date: 08/14/1964  . Smokeless tobacco: Never Used     Comment: quit 1965  . Alcohol use No     ROS:  As above   Medications: Current Outpatient Prescriptions  Medication Sig  Dispense Refill  . amLODipine (NORVASC) 5 MG tablet Take 1 tablet (5 mg total) by mouth daily. 30 tablet 5  . atorvastatin (LIPITOR) 80 MG tablet Take 0.5 tablets (40 mg total) by mouth at bedtime. 90 tablet 3  . benazepril (LOTENSIN) 40 MG tablet Take 1 tablet (40 mg total) by mouth daily. 90 tablet 1  . Cholecalciferol (VITAMIN D) 1000 UNITS capsule Take 1,000 Units by mouth daily.      . Coenzyme Q10 (COQ10 PO) Take 1 capsule by mouth daily.    . cyclobenzaprine (FLEXERIL) 5 MG tablet Take 1 at bedtime as needed for muscle relaxant. Caution: May cause drowsiness. 10 tablet 0  . diclofenac sodium (VOLTAREN) 1 % GEL Apply 4 g topically 4 (four) times daily. To affected joint. 100 g 11  . diphenhydramine-acetaminophen (TYLENOL PM) 25-500 MG TABS tablet Take 2 tablets by mouth at bedtime.    . furosemide (LASIX) 20 MG tablet Take 1 tablet (20 mg total) by mouth daily. 90 tablet 3  . Melatonin 5 MG TABS Take 5 mg by mouth at bedtime.    . metoprolol succinate (TOPROL-XL) 25 MG 24 hr tablet Take 1 tablet (25 mg total) by mouth daily. [place]  tablet 1  . Multiple Vitamin (MULTIVITAMIN WITH MINERALS) TABS tablet Take 1 tablet by mouth daily.    . nitroGLYCERIN (NITROSTAT) 0.4 MG SL tablet Place 1 tablet (0.4 mg total) under the tongue every 5 (five) minutes as needed for chest pain. (Patient not taking: Reported on 12/16/2016) 25 tablet 3  . pantoprazole (PROTONIX) 40 MG tablet Take 1 tablet (40 mg total) by mouth daily. 90 tablet 2  . Probiotic Product (PROBIOTIC DAILY PO) Take 1 tablet by mouth daily.    . rivaroxaban (XARELTO) 20 MG TABS tablet Take 1 tablet (20 mg total) by mouth daily with supper. Take 1 tablet by mouth daily with supper. 90 tablet 1  . tamsulosin (FLOMAX) 0.4 MG CAPS capsule Take 1 capsule (0.4 mg total) by mouth daily. 90 capsule 1  . vitamin B-12 (CYANOCOBALAMIN) 1000 MCG tablet Take 1,000 mcg by mouth daily.     No current facility-administered medications for this visit.    No  Known Allergies   Exam:  BP 130/65 (BP Location: Right Arm, Cuff Size: Normal)   Pulse 61   Temp 97.3 F (36.3 C) (Oral)   Resp 16   Wt 226 lb (102.5 kg)   SpO2 100%   BMI 32.43 kg/m  General: Well Developed, well nourished, and in no acute distress.  Neuro/Psych: Alert and oriented x3, extra-ocular muscles intact, able to move all 4 extremities, sensation grossly intact. Skin: Warm and dry, no rashes noted.  Respiratory: Not using accessory muscles, speaking in full sentences, trachea midline.  Cardiovascular: Pulses palpable, no extremity edema. Abdomen: Does not appear distended. MSK: BLEs with 2+ pitting edema. Right ankle diffusely edematous, however moderate effusion is also present. limited ROM due to edema, nontender to palpation.  X-ray left ankle 09/12/16: IMPRESSION: Degenerative changes of the tibiotalar articulation as well as the tarsal bones. Generalized soft tissue swelling is noted.  No results found for this or any previous visit (from the past 48 hour(s)). No results found.    Assessment and Plan: 83 y.o. male with ankle ankle pain due to DJD. Patient has failed conservative management so far. We'll research cost of hyaluronic acid injections however I'm not optimistic that insurance will authorize use of these medications. The meantime recommend compression stabilization and acetaminophen. Patient may benefit from tramadol however I'll defer long-term tramadol prescriptions to PCP discretion. Additionally we discussed the likely surgical options including ankle fusion or ankle replacement.   No orders of the defined types were placed in this encounter.   Discussed warning signs or symptoms. Please see discharge instructions. Patient expresses understanding.

## 2017-01-16 NOTE — Patient Instructions (Signed)
Thank you for coming in today. Use thigh high compression stockings with the ankle sleeve or brace.  Take acetaminophen every 6-8 hours for pain on a daily basis. We will research cost of gel shots. Return as needed.

## 2017-01-16 NOTE — Progress Notes (Signed)
HPI: FU coronary artery disease and atrial fibrillation. Patient is status post coronary artery bypassing graft in 1993 with a LIMA to the LAD, saphenous vein graft to the ramus intermedius and saphenous vein graft to the first diagonal. CT of the abdomen in January of 2009 showed no renal artery stenosis. There was no aortic aneurysm. Nuclear study in June 2014 showed scar in the mid/apical inferior wall and apex. There was a small area of ischemia in the base to mid anterior wall. The study was unchanged compared to May of 2012. The study was not gated. We have treated medically. Diuretics added recently for increased edema. Echo 12/17 showed EF 40-45, mild MR, severe LAE, moderate RAE, mild RVE, mild TR. Since last seen, patient's pedal edema has improved but not resolved. He denies dyspnea, chest pain, palpitations or syncope. No bleeding.  Current Outpatient Prescriptions  Medication Sig Dispense Refill  . amLODipine (NORVASC) 5 MG tablet Take 1 tablet (5 mg total) by mouth daily. 30 tablet 5  . atorvastatin (LIPITOR) 80 MG tablet Take 0.5 tablets (40 mg total) by mouth at bedtime. 90 tablet 3  . benazepril (LOTENSIN) 40 MG tablet Take 1 tablet (40 mg total) by mouth daily. 90 tablet 1  . Cholecalciferol (VITAMIN D) 1000 UNITS capsule Take 1,000 Units by mouth daily.      . Coenzyme Q10 (COQ10 PO) Take 1 capsule by mouth daily.    . cyclobenzaprine (FLEXERIL) 5 MG tablet Take 1 at bedtime as needed for muscle relaxant. Caution: May cause drowsiness. 10 tablet 0  . diclofenac sodium (VOLTAREN) 1 % GEL Apply 4 g topically 4 (four) times daily. To affected joint. 100 g 11  . diphenhydramine-acetaminophen (TYLENOL PM) 25-500 MG TABS tablet Take 2 tablets by mouth at bedtime.    . furosemide (LASIX) 20 MG tablet Take 1 tablet (20 mg total) by mouth daily. 90 tablet 3  . Melatonin 5 MG TABS Take 5 mg by mouth at bedtime.    . metoprolol succinate (TOPROL-XL) 25 MG 24 hr tablet Take 1 tablet  (25 mg total) by mouth daily. 90 tablet 1  . Multiple Vitamin (MULTIVITAMIN WITH MINERALS) TABS tablet Take 1 tablet by mouth daily.    . nitroGLYCERIN (NITROSTAT) 0.4 MG SL tablet Place 1 tablet (0.4 mg total) under the tongue every 5 (five) minutes as needed for chest pain. 25 tablet 3  . pantoprazole (PROTONIX) 40 MG tablet Take 1 tablet (40 mg total) by mouth daily. 90 tablet 2  . Probiotic Product (PROBIOTIC DAILY PO) Take 1 tablet by mouth daily.    . rivaroxaban (XARELTO) 20 MG TABS tablet Take 1 tablet (20 mg total) by mouth daily with supper. Take 1 tablet by mouth daily with supper. 90 tablet 1  . tamsulosin (FLOMAX) 0.4 MG CAPS capsule Take 1 capsule (0.4 mg total) by mouth daily. 90 capsule 1  . vitamin B-12 (CYANOCOBALAMIN) 1000 MCG tablet Take 1,000 mcg by mouth daily.     No current facility-administered medications for this visit.      Past Medical History:  Diagnosis Date  . Anemia   . Antral gastritis 2013   EGD   . Atrial fibrillation ([place])   . CAD (coronary artery disease)    s/p CABG 1993 (L-LAD, S-RI, S-D1);   echo 1/10: EF 55%;    myoview 12/09: inf MI, no ischemia  . Candida esophagitis ([place]) 2013   EGD   . Family history of malignant neoplasm  of gastrointestinal tract   . Folliculitis   . Hiatal hernia   . HTN (hypertension)   . Hyperlipidemia   . Irritable bladder   . Myocardial infarction   . Obesity   . Osteoarthritis     Past Surgical History:  Procedure Laterality Date  . CHOLECYSTECTOMY     laparoscopic '92  . CORONARY ARTERY BYPASS GRAFT     graft '92: LIMA-LAD, SVG - D2,R1, D1  . ENTEROSCOPY  08/22/2012   Procedure: ENTEROSCOPY;  Surgeon: Juanita Craver, MD;  Location: [facility];  Service: Endoscopy;  Laterality: N/A;  . KNEE SURGERY    . MOLE REMOVAL  2001 and 2008  . PILONIDAL CYST EXCISION      Social History   Social History  . Marital status: Widowed    Spouse name: N/A  . Number of children: 1  . Years of education: N/A    Occupational History  . Retired Retired   Social History Main Topics  . Smoking status: Former Smoker    Years: 16.00    Quit date: 08/14/1964  . Smokeless tobacco: Never Used     Comment: quit 1965  . Alcohol use No  . Drug use: No  . Sexual activity: Not on file   Other Topics Concern  . 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 Married '57- widowed Feb 26, 2023. 1 son -'23- died '68 MVA.    Lives by himself   No immediate family. Emergency contact : Daron Offer [phone] (friend)    Family History  Problem Relation Age of Onset  . Coronary artery disease Father     and brother  . Heart attack Father     and brother-fatal  . Stroke Father   . Breast cancer Mother   . Alzheimer's disease Mother   . Colon cancer Maternal Uncle   . Esophageal cancer Neg Hx   . Stomach cancer Neg Hx   . Rectal cancer Neg Hx   . Prostate cancer Neg Hx     ROS: no fevers or chills, productive cough, hemoptysis, dysphasia, odynophagia, melena, hematochezia, dysuria, hematuria, rash, seizure activity, orthopnea, PND, claudication. Remaining systems are negative.  Physical Exam: Well-developed well-nourished in no acute distress.  Skin is warm and dry.  HEENT is normal.  Neck is supple.  Chest is clear to auscultation with normal expansion.  Cardiovascular exam is irregular Abdominal exam nontender or distended. No masses palpated. Extremities show 1+ edema. neuro grossly intact   A/P  1 Permanent atrial fibrillation-continue toprol for rate control; continue xarelto.  2 Coronary artery disease-continue statin. No aspirin given need for anticoagulation.  3 hypertension-blood pressure controlled. Continue present medications.  4 hyperlipidemia-continue statin.  5 chronic combined systolic/diastolic congestive heart failure-recent echocardiogram showed LV function similar to past. His edema has improved but not resolved. I will increase Lasix to 40 mg daily. Check  potassium and renal function in 1 week.  Kirk Ruths, MD

## 2017-01-20 NOTE — Progress Notes (Signed)
Left VM for Pt to return clinic call regarding Monovisc for his ankle. Spoke with Rep, injection would be around $800. Will advise when Pt returns call.

## 2017-01-20 NOTE — Progress Notes (Signed)
[name] called back and was advised of the cost of the medication/injection.

## 2017-01-21 ENCOUNTER — Telehealth: Payer: Self-pay | Admitting: Cardiology

## 2017-01-21 NOTE — Telephone Encounter (Signed)
Pt notified no samples available 

## 2017-01-21 NOTE — Telephone Encounter (Signed)
[name] Is asking for samples of Xarelto . He goes to the [place] office

## 2017-01-21 NOTE — Telephone Encounter (Signed)
No saamples available of Xarelto 20mg 

## 2017-01-22 ENCOUNTER — Encounter: Payer: Self-pay | Admitting: Cardiology

## 2017-01-22 ENCOUNTER — Ambulatory Visit (INDEPENDENT_AMBULATORY_CARE_PROVIDER_SITE_OTHER): Payer: Medicare Other | Admitting: Cardiology

## 2017-01-22 VITALS — BP 125/75 | HR 66 | Ht 70.0 in | Wt 224.4 lb

## 2017-01-22 DIAGNOSIS — I5042 Chronic combined systolic (congestive) and diastolic (congestive) heart failure: Secondary | ICD-10-CM

## 2017-01-22 DIAGNOSIS — I4891 Unspecified atrial fibrillation: Secondary | ICD-10-CM

## 2017-01-22 DIAGNOSIS — I2510 Atherosclerotic heart disease of native coronary artery without angina pectoris: Secondary | ICD-10-CM | POA: Diagnosis not present

## 2017-01-22 MED ORDER — RIVAROXABAN 20 MG PO TABS: 20.0000 mg | ORAL_TABLET | Freq: Every day | ORAL | 1 refills | Status: DC

## 2017-01-22 MED ORDER — FUROSEMIDE 20 MG PO TABS: 40.0000 mg | ORAL_TABLET | Freq: Every day | ORAL | 3 refills | Status: DC

## 2017-01-22 NOTE — Patient Instructions (Signed)
Medication Instructions:   INCREASE FUROSEMIDE TO 40 MG ONCE DAILY= 2 OF THE 20 MG TABLETS ONCE DAILY  Labwork:  Your physician recommends that you return for lab work in: [place]:  Your physician wants you to follow-up in: [place] will receive a reminder letter in the mail two months in advance. If you don't receive a letter, please call our office to schedule the follow-up appointment.   If you need a refill on your cardiac medications before your next appointment, please call your pharmacy.

## 2017-01-30 DIAGNOSIS — I4891 Unspecified atrial fibrillation: Secondary | ICD-10-CM | POA: Diagnosis not present

## 2017-01-31 DIAGNOSIS — M1711 Unilateral primary osteoarthritis, right knee: Secondary | ICD-10-CM | POA: Diagnosis not present

## 2017-01-31 LAB — BASIC METABOLIC PANEL
BUN: 21 mg/dL (ref 7–25)
CHLORIDE: 109 mmol/L (ref 98–110)
CO2: 25 mmol/L (ref 20–31)
Calcium: 9.5 mg/dL (ref 8.6–10.3)
Creat: 1.49 mg/dL — ABNORMAL HIGH (ref 0.70–1.11)
Glucose, Bld: 151 mg/dL — ABNORMAL HIGH (ref 65–99)
POTASSIUM: 4.1 mmol/L (ref 3.5–5.3)
SODIUM: 143 mmol/L (ref 135–146)

## 2017-02-03 DIAGNOSIS — L821 Other seborrheic keratosis: Secondary | ICD-10-CM | POA: Diagnosis not present

## 2017-02-03 DIAGNOSIS — L570 Actinic keratosis: Secondary | ICD-10-CM | POA: Diagnosis not present

## 2017-02-03 DIAGNOSIS — Z08 Encounter for follow-up examination after completed treatment for malignant neoplasm: Secondary | ICD-10-CM | POA: Diagnosis not present

## 2017-02-03 DIAGNOSIS — Z85828 Personal history of other malignant neoplasm of skin: Secondary | ICD-10-CM | POA: Diagnosis not present

## 2017-02-07 ENCOUNTER — Telehealth: Payer: Self-pay | Admitting: Cardiology

## 2017-02-07 NOTE — Telephone Encounter (Signed)
Patient states that [name] was going to bring a Xarelto sample to [facility] Wednesday. Patient was out of town and could not get to the office and would like to know if the sample is still at the [place] office. Thanks.

## 2017-02-07 NOTE — Telephone Encounter (Signed)
[name] says that samples are there waiting for him  Pt notified

## 2017-02-11 DIAGNOSIS — M25561 Pain in right knee: Secondary | ICD-10-CM | POA: Diagnosis not present

## 2017-02-11 DIAGNOSIS — R262 Difficulty in walking, not elsewhere classified: Secondary | ICD-10-CM | POA: Diagnosis not present

## 2017-02-11 DIAGNOSIS — M25661 Stiffness of right knee, not elsewhere classified: Secondary | ICD-10-CM | POA: Diagnosis not present

## 2017-02-17 DIAGNOSIS — M6281 Muscle weakness (generalized): Secondary | ICD-10-CM | POA: Diagnosis not present

## 2017-02-17 DIAGNOSIS — R262 Difficulty in walking, not elsewhere classified: Secondary | ICD-10-CM | POA: Diagnosis not present

## 2017-02-17 DIAGNOSIS — M25561 Pain in right knee: Secondary | ICD-10-CM | POA: Diagnosis not present

## 2017-02-17 DIAGNOSIS — M25661 Stiffness of right knee, not elsewhere classified: Secondary | ICD-10-CM | POA: Diagnosis not present

## 2017-02-18 DIAGNOSIS — H353131 Nonexudative age-related macular degeneration, bilateral, early dry stage: Secondary | ICD-10-CM | POA: Diagnosis not present

## 2017-02-18 DIAGNOSIS — H43813 Vitreous degeneration, bilateral: Secondary | ICD-10-CM | POA: Diagnosis not present

## 2017-02-18 DIAGNOSIS — Z7901 Long term (current) use of anticoagulants: Secondary | ICD-10-CM | POA: Diagnosis not present

## 2017-02-18 DIAGNOSIS — H2513 Age-related nuclear cataract, bilateral: Secondary | ICD-10-CM | POA: Diagnosis not present

## 2017-02-20 DIAGNOSIS — M1711 Unilateral primary osteoarthritis, right knee: Secondary | ICD-10-CM | POA: Diagnosis not present

## 2017-02-26 DIAGNOSIS — M1711 Unilateral primary osteoarthritis, right knee: Secondary | ICD-10-CM | POA: Diagnosis not present

## 2017-02-27 DIAGNOSIS — M6281 Muscle weakness (generalized): Secondary | ICD-10-CM | POA: Diagnosis not present

## 2017-02-27 DIAGNOSIS — M25561 Pain in right knee: Secondary | ICD-10-CM | POA: Diagnosis not present

## 2017-02-27 DIAGNOSIS — R262 Difficulty in walking, not elsewhere classified: Secondary | ICD-10-CM | POA: Diagnosis not present

## 2017-03-04 DIAGNOSIS — R262 Difficulty in walking, not elsewhere classified: Secondary | ICD-10-CM | POA: Diagnosis not present

## 2017-03-04 DIAGNOSIS — M6281 Muscle weakness (generalized): Secondary | ICD-10-CM | POA: Diagnosis not present

## 2017-03-04 DIAGNOSIS — M25561 Pain in right knee: Secondary | ICD-10-CM | POA: Diagnosis not present

## 2017-03-04 DIAGNOSIS — M25661 Stiffness of right knee, not elsewhere classified: Secondary | ICD-10-CM | POA: Diagnosis not present

## 2017-03-05 DIAGNOSIS — M1711 Unilateral primary osteoarthritis, right knee: Secondary | ICD-10-CM | POA: Diagnosis not present

## 2017-03-10 ENCOUNTER — Telehealth: Payer: Self-pay | Admitting: Cardiology

## 2017-03-10 NOTE — Telephone Encounter (Signed)
DC norvasc and follow BP; last K normal 2/18. Kirk Ruths

## 2017-03-10 NOTE — Telephone Encounter (Signed)
Returned call to patient He reports low BP for about 1 week + He reports SBP high 80s-90s Today his BP ranged from 82-574 systolic He has dizzy spells He has positional dizziness Takes amlodipine QAM, benazepril QAM, metoprolol succinate 25mg  QAM, lasix 40mg  QAM - should he take one QHS?  Patient concerned that he is not taking potassium w/lasix  Informed him will defer to Dr. Stanford Breed for recommendations

## 2017-03-10 NOTE — Telephone Encounter (Signed)
New message   Pt c/o BP issue: STAT if pt c/o blurred vision, one-sided weakness or slurred speech  1. What are your last 5 BP readings? Pt only had top numbers. 99 89 he said the bottom was around 60 or so  2. Are you having any other symptoms (ex. Dizziness, headache, blurred vision, passed out)? Dizzy  3. What is your BP issue? Pt is calling because he says he is having dizzy spells. He said his bp is going low.

## 2017-03-10 NOTE — Telephone Encounter (Signed)
Spoke with pt, Aware of dr crenshaw's recommendations.  °

## 2017-04-10 ENCOUNTER — Other Ambulatory Visit: Payer: Self-pay | Admitting: Internal Medicine

## 2017-04-12 ENCOUNTER — Other Ambulatory Visit: Payer: Self-pay | Admitting: Internal Medicine

## 2017-04-17 ENCOUNTER — Ambulatory Visit (INDEPENDENT_AMBULATORY_CARE_PROVIDER_SITE_OTHER): Payer: Medicare Other | Admitting: Internal Medicine

## 2017-04-17 ENCOUNTER — Encounter: Payer: Self-pay | Admitting: Internal Medicine

## 2017-04-17 VITALS — BP 126/74 | HR 64 | Temp 98.4°F | Resp 14 | Ht 70.0 in | Wt 217.5 lb

## 2017-04-17 DIAGNOSIS — Z0000 Encounter for general adult medical examination without abnormal findings: Secondary | ICD-10-CM

## 2017-04-17 DIAGNOSIS — E1159 Type 2 diabetes mellitus with other circulatory complications: Secondary | ICD-10-CM | POA: Diagnosis not present

## 2017-04-17 LAB — COMPREHENSIVE METABOLIC PANEL
ALBUMIN: 4.3 g/dL (ref 3.5–5.2)
ALK PHOS: 50 U/L (ref 39–117)
ALT: 15 U/L (ref 0–53)
AST: 19 U/L (ref 0–37)
BUN: 21 mg/dL (ref 6–23)
CO2: 26 mEq/L (ref 19–32)
CREATININE: 1.53 mg/dL — AB (ref 0.40–1.50)
Calcium: 9.4 mg/dL (ref 8.4–10.5)
Chloride: 108 mEq/L (ref 96–112)
GFR: 46.38 mL/min — ABNORMAL LOW (ref >60.00)
GLUCOSE: 130 mg/dL — AB (ref 70–99)
POTASSIUM: 3.9 meq/L (ref 3.5–5.1)
SODIUM: 143 meq/L (ref 135–145)
TOTAL PROTEIN: 7.1 g/dL (ref 6.0–8.3)
Total Bilirubin: 0.8 mg/dL (ref 0.2–1.2)

## 2017-04-17 LAB — LIPID PANEL
CHOLESTEROL: 105 mg/dL (ref 0–200)
HDL: 33.4 mg/dL — ABNORMAL LOW (ref >39.00)
LDL Cholesterol: 43 mg/dL (ref 0–99)
NONHDL: 71.1
Total CHOL/HDL Ratio: 3
Triglycerides: 141 mg/dL (ref 0.0–149.0)
VLDL: 28.2 mg/dL (ref 0.0–40.0)

## 2017-04-17 LAB — TSH: TSH: 3.12 u[IU]/mL (ref 0.35–4.50)

## 2017-04-17 LAB — HEMOGLOBIN A1C: HEMOGLOBIN A1C: 6.1 % (ref 4.6–6.5)

## 2017-04-17 MED ORDER — RIVAROXABAN 20 MG PO TABS: 20.0000 mg | ORAL_TABLET | Freq: Every day | ORAL | 0 refills | Status: DC

## 2017-04-17 NOTE — Progress Notes (Signed)
Pre visit review using our clinic review tool, if applicable. No additional management support is needed unless otherwise documented below in the visit note. 

## 2017-04-17 NOTE — Progress Notes (Signed)
Subjective:    Patient ID: [name], male    DOB: Jul 01, 1931, 83 y.o.   MRN: [id number]  DOS:  04/17/2017 Type of visit - description : cpx Interval history: In general feeling well. Since the last visit, Lasix dose adjusted, amlodipine discontinued due to low BP.  Wt Readings from Last 3 Encounters:  04/17/17 217 lb 8 oz (98.7 kg)  01/22/17 224 lb 6.4 oz (101.8 kg)  01/16/17 226 lb (102.5 kg)     Review of Systems History of cataract surgery, to see the eye doctor soon. Continue with peri-ankle edema, worse at the end of the day, better with leg elevation. L UTS at baseline, occasionally urgency and "leaks". Has DJD, mild arthralgias on and off at baseline. He remains very active Denies any lower extremity paresthesias   Other than above, a 14 point review of systems is negative    Past Medical History:  Diagnosis Date  . Anemia   . Antral gastritis 2013   EGD   . Atrial fibrillation ([place])   . CAD (coronary artery disease)    s/p CABG 1993 (L-LAD, S-RI, S-D1);   echo 1/10: EF 55%;    myoview 12/09: inf MI, no ischemia  . Candida esophagitis ([place]) 2013   EGD   . Family history of malignant neoplasm of gastrointestinal tract   . Folliculitis   . Hiatal hernia   . HTN (hypertension)   . Hyperlipidemia   . Irritable bladder   . Myocardial infarction ([place])   . Obesity   . Osteoarthritis     Past Surgical History:  Procedure Laterality Date  . CHOLECYSTECTOMY     laparoscopic '92  . CORONARY ARTERY BYPASS GRAFT     graft '92: LIMA-LAD, SVG - D2,R1, D1  . ENTEROSCOPY  08/22/2012   Procedure: ENTEROSCOPY;  Surgeon: Juanita Craver, MD;  Location: [facility];  Service: Endoscopy;  Laterality: N/A;  . KNEE SURGERY    . MOLE REMOVAL  2001 and 2008  . PILONIDAL CYST EXCISION      Social History   Social History  . Marital status: Widowed    Spouse name: N/A  . Number of children: 1  . Years of education: N/A   Occupational History  . Retired Retired    Social History Main Topics  . Smoking status: Former Smoker    Years: 16.00    Quit date: 08/14/1964  . Smokeless tobacco: Never Used     Comment: quit 1965  . Alcohol use No  . Drug use: No  . Sexual activity: Not on file   Other Topics Concern  . 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 Married '57- widowed 09-Feb-2023. 1 son -'66- died '44 MVA.    Lives by himself   No immediate family. Emergency contact : Daron Offer [phone] (friend)     Family History  Problem Relation Age of Onset  . Coronary artery disease Father        and brother  . Heart attack Father        and brother-fatal  . Stroke Father   . Breast cancer Mother   . Alzheimer's disease Mother   . Colon cancer Maternal Uncle   . Esophageal cancer Neg Hx   . Stomach cancer Neg Hx   . Rectal cancer Neg Hx   . Prostate cancer Neg Hx      Allergies as of  04/17/2017   No Known Allergies     Medication List       Accurate as of 04/17/17 11:59 PM. Always use your most recent med list.          atorvastatin 80 MG tablet Commonly known as:  LIPITOR Take 0.5 tablets (40 mg total) by mouth at bedtime.   benazepril 40 MG tablet Commonly known as:  LOTENSIN Take 1 tablet (40 mg total) by mouth daily.   COQ10 PO Take 1 capsule by mouth daily.   cyclobenzaprine 5 MG tablet Commonly known as:  FLEXERIL Take 1 at bedtime as needed for muscle relaxant. Caution: May cause drowsiness.   diclofenac sodium 1 % Gel Commonly known as:  VOLTAREN Apply 4 g topically 4 (four) times daily. To affected joint.   diphenhydramine-acetaminophen 25-500 MG Tabs tablet Commonly known as:  TYLENOL PM Take 2 tablets by mouth at bedtime.   furosemide 20 MG tablet Commonly known as:  LASIX Take 2 tablets (40 mg total) by mouth daily.   Melatonin 5 MG Tabs Take 5 mg by mouth at bedtime.   metoprolol succinate 25 MG 24 hr tablet Commonly known as:  TOPROL-XL Take 1 tablet (25  mg total) by mouth daily.   multivitamin with minerals Tabs tablet Take 1 tablet by mouth daily.   nitroGLYCERIN 0.4 MG SL tablet Commonly known as:  NITROSTAT Place 1 tablet (0.4 mg total) under the tongue every 5 (five) minutes as needed for chest pain.   pantoprazole 40 MG tablet Commonly known as:  PROTONIX Take 1 tablet (40 mg total) by mouth daily.   PROBIOTIC DAILY PO Take 1 tablet by mouth daily.   rivaroxaban 20 MG Tabs tablet Commonly known as:  XARELTO Take 1 tablet (20 mg total) by mouth daily with supper. Take 1 tablet by mouth daily with supper.   tamsulosin 0.4 MG Caps capsule Commonly known as:  FLOMAX Take 1 capsule (0.4 mg total) by mouth daily.   vitamin B-12 1000 MCG tablet Commonly known as:  CYANOCOBALAMIN Take 1,000 mcg by mouth daily.   Vitamin D 1000 units capsule Take 1,000 Units by mouth daily.          Objective:   Physical Exam BP 126/74 (BP Location: Left Arm, Patient Position: Sitting, Cuff Size: Normal)   Pulse 64   Temp 98.4 F (36.9 C) (Oral)   Resp 14   Ht 5\' 10"  (1.778 m)   Wt 217 lb 8 oz (98.7 kg)   SpO2 97%   BMI 31.21 kg/m   General:   Well developed, well nourished . NAD.  Neck: No  thyromegaly  HEENT:  Normocephalic . Face symmetric, atraumatic Lungs:  CTA B Normal respiratory effort, no intercostal retractions, no accessory muscle use. Heart:irreg,  no murmur.  DIABETIC FEET EXAM: No lower extremity edema Normal pedal pulses bilaterally Skin dry, nails are thick, no calluses Pinprick examination of the feet normal. Abdomen:  Not distended, soft, non-tender. No rebound or rigidity.  No bruit Skin: Exposed areas without rash. Not pale. Not jaundice Neurologic:  alert & oriented X3.  Speech normal, gait appropriate for age and unassisted Strength symmetric and appropriate for age.  Psych: Cognition and judgment appear intact.  Cooperative with normal attention span and concentration.  Behavior  appropriate. No anxious or depressed appearing.    Assessment & Plan:    Assessment >  DM --diet control, no neuropathy HTN Hyperlipidemia CV: --CAD, CABG 1993, Myoview 2009 no ischemia. --Atrial fibrillation --  rate control, xarelto -- chronic combined systolic/diastolic congestive heart failure Venous insufficiency, mild LE edema L>>R  LUTS:  DRE-PSA wnl 07-2016 DJD GI: --Candida esophagitis, antral gastritis --->  2013 per EGD --HH  PLAN: DM: Diet control, check A1c, feet exam (-). HTN: Currently well controlled, see below Hyperlipidemia: On Lipitor, check labs A. fib --samples of Xarelto # 14 provided CHF: Since the last visit, he contacted cardiology, due to edema Lasix 20 mg was increased to 2 tablets, then BP dropped, was rec to d/c amlodipine. Currently feeling well, on Lasix 20 mg "one or 2 tablets a day",Lotensin, Toprol. Recommend to weight himself daily, call if there is increasing 5 pounds, I would favor to take 2 Lasix daily as long as his BP is not low. RTC 6 months, AWV recommended

## 2017-04-17 NOTE — Patient Instructions (Signed)
GO TO THE LAB : Get the blood work     GO TO THE FRONT DESK Schedule your next appointment for a  riutine check in 6 months    Weight yourself daily. If you gain more than 5 pounds please call cardiology or myself  Schedule a Medicare wellness one of our RNs

## 2017-04-17 NOTE — Assessment & Plan Note (Addendum)
--  Td 2013;    PNM shot 2013; prevnar 2015 ;zostavax 2016; shingrex discussed , it is in back order, pt will think about it  --Cscope 02-2012 (-), next per GI --Prostate cancer screening, see previous entry, no further screening  -- Doing well with lifestyle, goes to the gym 3 times a week, plays golf 3 times a week --Labs: CMP, FLP, A1c, TSH.

## 2017-04-18 NOTE — Assessment & Plan Note (Signed)
DM: Diet control, check A1c, feet exam (-). HTN: Currently well controlled, see below Hyperlipidemia: On Lipitor, check labs A. fib --samples of Xarelto # 14 provided CHF: Since the last visit, he contacted cardiology, due to edema Lasix 20 mg was increased to 2 tablets, then BP dropped, was rec to d/c amlodipine. Currently feeling well, on Lasix 20 mg "one or 2 tablets a day",Lotensin, Toprol. Recommend to weight himself daily, call if there is increasing 5 pounds, I would favor to take 2 Lasix daily as long as his BP is not low. RTC 6 months, AWV recommended

## 2017-05-06 ENCOUNTER — Other Ambulatory Visit: Payer: Self-pay | Admitting: Internal Medicine

## 2017-05-14 DIAGNOSIS — H25813 Combined forms of age-related cataract, bilateral: Secondary | ICD-10-CM | POA: Diagnosis not present

## 2017-05-14 DIAGNOSIS — H35033 Hypertensive retinopathy, bilateral: Secondary | ICD-10-CM | POA: Diagnosis not present

## 2017-05-14 DIAGNOSIS — H52203 Unspecified astigmatism, bilateral: Secondary | ICD-10-CM | POA: Diagnosis not present

## 2017-05-14 DIAGNOSIS — H02831 Dermatochalasis of right upper eyelid: Secondary | ICD-10-CM | POA: Diagnosis not present

## 2017-05-14 DIAGNOSIS — H527 Unspecified disorder of refraction: Secondary | ICD-10-CM | POA: Diagnosis not present

## 2017-05-14 LAB — HM DIABETES EYE EXAM

## 2017-05-15 ENCOUNTER — Ambulatory Visit (INDEPENDENT_AMBULATORY_CARE_PROVIDER_SITE_OTHER): Payer: Medicare Other | Admitting: Family Medicine

## 2017-05-15 ENCOUNTER — Encounter: Payer: Self-pay | Admitting: Family Medicine

## 2017-05-15 VITALS — BP 163/85 | HR 47 | Wt 214.0 lb

## 2017-05-15 DIAGNOSIS — M79661 Pain in right lower leg: Secondary | ICD-10-CM | POA: Diagnosis not present

## 2017-05-15 DIAGNOSIS — M660 Rupture of popliteal cyst: Secondary | ICD-10-CM

## 2017-05-15 NOTE — Patient Instructions (Signed)
Thank you for coming in today. I think you have a ruptured backers cyst.  Work on calf exercises.  Do 30 reps 2-3 x daily.  Use compression sleeve.  Recheck in 4 weeks if not better.    Baker Cyst A Baker cyst, also called a popliteal cyst, is a sac-like growth that forms at the back of the knee. The cyst forms when the fluid-filled sac (bursa) that cushions the knee joint becomes enlarged. The bursa that becomes a Baker cyst is located at the back of the knee joint. What are the causes? In most cases, a Baker cyst results from another knee problem that causes swelling inside the knee. This makes the fluid inside the knee joint (synovial fluid) flow into the bursa behind the knee, causing the bursa to enlarge. What increases the risk? You may be more likely to develop a Baker cyst if you already have a knee problem, such as:  A tear in cartilage that cushions the knee joint (meniscal tear).  A tear in the tissues that connect the bones of the knee joint (ligament tear).  Knee swelling from osteoarthritis, rheumatoid arthritis, or gout.  What are the signs or symptoms? A Baker cyst does not always cause symptoms. A lump behind the knee may be the only sign of the condition. The lump may be painful, especially when the knee is straightened. If the lump is painful, the pain may come and go. The knee may also be stiff. Symptoms may quickly get more severe if the cyst breaks open (ruptures). If your cyst ruptures, signs and symptoms may affect the knee and the back of the lower leg (calf) and may include:  Sudden or worsening pain.  Swelling.  Bruising.  How is this diagnosed? This condition may be diagnosed based on your symptoms and medical history. Your health care provider will also do a physical exam. This may include:  Feeling the cyst to check whether it is tender.  Checking your knee for signs of another knee condition that causes swelling.  You may have imaging tests, such  as:  X-rays.  MRI.  Ultrasound.  How is this treated? A Baker cyst that is not painful may go away without treatment. If the cyst gets large or painful, it will likely get better if the underlying knee problem is treated. Treatment for a Baker cyst may include:  Resting.  Keeping weight off of the knee. This means not leaning on the knee to support your body weight.  NSAIDs to reduce pain and swelling.  A procedure to drain the fluid from the cyst with a needle (aspiration). You may also get an injection of a medicine that reduces swelling (steroid).  Surgery. This may be needed if other treatments do not work. This usually involves correcting knee damage and removing the cyst.  Follow these instructions at home:  Take over-the-counter and prescription medicines only as told by your health care provider.  Rest and return to your normal activities as told by your health care provider. Avoid activities that make pain or swelling worse. Ask your health care provider what activities are safe for you.  Keep all follow-up visits as told by your health care provider. This is important. Contact a health care provider if:  You have knee pain, stiffness, or swelling that does not get better. Get help right away if:  You have sudden or worsening pain and swelling in your calf area. This information is not intended to replace advice given to  you by your health care provider. Make sure you discuss any questions you have with your health care provider. Document Released: 11/25/2005 Document Revised: 08/15/2016 Document Reviewed: 08/15/2016 Elsevier Interactive Patient Education  2018 [employer].   Medial Head Gastrocnemius Tear Rehab Ask your health care provider which exercises are safe for you. Do exercises exactly as told by your health care provider and adjust them as directed. It is normal to feel mild stretching, pulling, tightness, or discomfort as you do these exercises, but you  should stop right away if you feel sudden pain or your pain gets worse.Do not begin these exercises until told by your health care provider. Stretching and range of motion exercises These exercises warm up your muscles and joints and improve the movement and flexibility of your lower leg. These exercises also help to relieve pain and stiffness. Exercise A: Gastrocnemius stretch  1. Sit with your left / right leg extended. 2. Loop a belt or towel around the ball of your left / right foot. The ball of your foot is on the walking surface, right under your toes. 3. Hold both ends of the belt or towel. 4. Keep your left / right ankle and foot relaxed and keep your knee straight while you use the belt or towel to pull your foot and ankle toward you. Stop at the first point of resistance. 5. Hold this position for __________ seconds. Repeat __________ times. Complete this exercise __________ times a day. Exercise B: Ankle alphabet  1. Sit with your left / right leg supported at the lower leg. ? Do not rest your foot on anything. ? Make sure your foot has room to move freely. 2. Think of your left / right foot as a paintbrush, and move your foot to trace each letter of the alphabet in the air. Keep your hip and knee still while you trace. 3. Trace every letter from A to Z. Repeat __________ times. Complete this exercise __________ times a day. Strengthening exercises These exercises build strength and endurance in your lower leg. Endurance is the ability to use your muscles for a long time, even after they get tired. Exercise C: Plantar flexors with band  1. Sit with your left / right leg extended. 2. Loop a rubber exercise band or tube around the ball of your left / right foot. The ball of your foot is on the walking surface, right under your toes. 3. While holding both ends of the band or tube, slowly point your toes downward, pushing them away from you. 4. Hold this position for __________  seconds. 5. Slowly return your foot to the starting position. Repeat __________ times. Complete this exercise __________ times a day. Exercise D: Plantar flexors, standing  1. Stand with your feet shoulder-width apart. 2. Place your hands on a wall or table to steady yourself as needed, but try not to use it very much for support. 3. Rise up on your toes. 4. If this exercise is too easy, try these options: ? Shift your weight toward your left / right leg until you feel challenged. ? If told by your health care provider, stand on your left / right foot only. 5. Hold this position for __________ seconds. Repeat __________ times. Complete this exercise __________ times a day. Exercise E: Plantar flexors, eccentric  1. Stand on the balls of your feet on the edge of a step. The ball of your foot is on the walking surface, right under your toes. 2. Place your  hands on a wall or railing for balance as needed, but try not to lean on it for support. 3. Rise up on your toes, using both legs to help. 4. Slowly shift all of your weight to your left / right foot and lift your other foot off the step. 5. Slowly lower your left / right heel so it drops below the level of the step. You will feel a slight stretch in your left / right calf. 6. Put your other foot back onto the step. Repeat __________ times. Complete this exercise __________ times a day. This information is not intended to replace advice given to you by your health care provider. Make sure you discuss any questions you have with your health care provider. Document Released: 11/25/2005 Document Revised: 08/01/2016 Document Reviewed: 11/07/2015 Elsevier Interactive Patient Education  [employer].

## 2017-05-15 NOTE — Progress Notes (Signed)
[name] is a 83 y.o. male who presents to [facility] today for right calf pain. Patient has a 3 week history of right calf pain. Pain is worse with activity and better with rest. He denies significant swelling. He denies any injury. He notes that walking up a hill tends to make his pain worse. He notes that before the cath started hurting he did have some knee swelling which has resolved itself. He has not had much treatment for this calf pain yet.   Past Medical History:  Diagnosis Date  . Anemia   . Antral gastritis 2013   EGD   . Atrial fibrillation ([place])   . CAD (coronary artery disease)    s/p CABG 1993 (L-LAD, S-RI, S-D1);   echo 1/10: EF 55%;    myoview 12/09: inf MI, no ischemia  . Candida esophagitis ([place]) 2013   EGD   . Family history of malignant neoplasm of gastrointestinal tract   . Folliculitis   . Hiatal hernia   . HTN (hypertension)   . Hyperlipidemia   . Irritable bladder   . Myocardial infarction ([place])   . Obesity   . Osteoarthritis    Past Surgical History:  Procedure Laterality Date  . CHOLECYSTECTOMY     laparoscopic '92  . CORONARY ARTERY BYPASS GRAFT     graft '92: LIMA-LAD, SVG - D2,R1, D1  . ENTEROSCOPY  08/22/2012   Procedure: ENTEROSCOPY;  Surgeon: Juanita Craver, MD;  Location: [facility];  Service: Endoscopy;  Laterality: N/A;  . KNEE SURGERY    . MOLE REMOVAL  2001 and 2008  . PILONIDAL CYST EXCISION     Social History  Substance Use Topics  . Smoking status: Former Smoker    Years: 16.00    Quit date: 08/14/1964  . Smokeless tobacco: Never Used     Comment: quit 1965  . Alcohol use No     ROS:  As above   Medications: Current Outpatient Prescriptions  Medication Sig Dispense Refill  . atorvastatin (LIPITOR) 80 MG tablet Take 0.5 tablets (40 mg total) by mouth at bedtime. 90 tablet 3  . benazepril (LOTENSIN) 40 MG tablet Take 1 tablet (40 mg total) by mouth daily. 90 tablet 0  .  Cholecalciferol (VITAMIN D) 1000 UNITS capsule Take 1,000 Units by mouth daily.      . Coenzyme Q10 (COQ10 PO) Take 1 capsule by mouth daily.    . cyclobenzaprine (FLEXERIL) 5 MG tablet Take 1 at bedtime as needed for muscle relaxant. Caution: May cause drowsiness. 10 tablet 0  . diclofenac sodium (VOLTAREN) 1 % GEL Apply 4 g topically 4 (four) times daily. To affected joint. 100 g 11  . diphenhydramine-acetaminophen (TYLENOL PM) 25-500 MG TABS tablet Take 2 tablets by mouth at bedtime.    . furosemide (LASIX) 20 MG tablet Take 2 tablets (40 mg total) by mouth daily. (Patient taking differently: Take 20 mg by mouth daily. ) 180 tablet 3  . Melatonin 5 MG TABS Take 5 mg by mouth at bedtime.    . metoprolol succinate (TOPROL-XL) 25 MG 24 hr tablet Take 1 tablet (25 mg total) by mouth daily. 90 tablet 1  . Multiple Vitamin (MULTIVITAMIN WITH MINERALS) TABS tablet Take 1 tablet by mouth daily.    . nitroGLYCERIN (NITROSTAT) 0.4 MG SL tablet Place 1 tablet (0.4 mg total) under the tongue every 5 (five) minutes as needed for chest pain. (Patient not taking: Reported on 04/17/2017) 25  tablet 3  . pantoprazole (PROTONIX) 40 MG tablet Take 1 tablet (40 mg total) by mouth daily. 90 tablet 2  . Probiotic Product (PROBIOTIC DAILY PO) Take 1 tablet by mouth daily.    . rivaroxaban (XARELTO) 20 MG TABS tablet Take 1 tablet (20 mg total) by mouth daily with supper. Take 1 tablet by mouth daily with supper. 90 tablet 1  . tamsulosin (FLOMAX) 0.4 MG CAPS capsule Take 1 capsule (0.4 mg total) by mouth daily. 90 capsule 0  . vitamin B-12 (CYANOCOBALAMIN) 1000 MCG tablet Take 1,000 mcg by mouth daily.     No current facility-administered medications for this visit.    No Known Allergies   Exam:  BP (!) 163/85   Pulse (!) 47   Wt 214 lb (97.1 kg)   BMI 30.71 kg/m  General: Well Developed, well nourished, and in no acute distress.  Neuro/Psych: Alert and oriented x3, extra-ocular muscles intact, able to move  all 4 extremities, sensation grossly intact. Skin: Warm and dry, no rashes noted.  Respiratory: Not using accessory muscles, speaking in full sentences, trachea midline.  Cardiovascular: Pulses palpable, no extremity edema. Abdomen: Does not appear distended. MSK:  Right knee no effusion. No palpable Baker's cyst. Right calf normal-appearing without any obvious swelling or ecchymosis or palpable cords. Slightly tender to palpation at the distal medial gastrocnemius.  Achilles tendon is intact with no palpable defects and is nontender. Normal gait.  Limited musculoskeletal ultrasound of the right calf reveals hypoechoic marbling of the subcutaneous tissue of the posterior lower leg from the midportion of the gastrocnemius distally into the Achilles tendon area. No obvious muscle defects are present. No blood flow is present within the hypoechoic subcutaneous marbling. No Baker's cyst is present on ultrasound examination of the posterior knee. Normal bony structures are present.    No results found for this or any previous visit (from the past 48 hour(s)). No results found.    Assessment and Plan: 83 y.o. male with  Right-sided calf pain. Patient has tenderness in the medial gastrocnemius as well as marbling under the skin. This is typically seen with a medial gastrocnemius tear. However he does not have any ecchymosis of the skin that would suggest this. I think it more likely explanation is a ruptured Baker's cyst which would have a similar presentation. He did have resolving knee pain prior to the onset of calf pain which would explain the symptoms.  Regardless the treatment is similar. Plan for compressive Sleeve and eccentric calf exercises. Recheck in 4 weeks if not improving. Return sooner if needed.    No orders of the defined types were placed in this encounter.  No orders of the defined types were placed in this encounter.   Discussed warning signs or symptoms. Please see  discharge instructions. Patient expresses understanding.  CC: Colon Branch, MD

## 2017-05-19 ENCOUNTER — Encounter: Payer: Self-pay | Admitting: Internal Medicine

## 2017-05-20 ENCOUNTER — Other Ambulatory Visit: Payer: Self-pay | Admitting: Cardiology

## 2017-06-01 ENCOUNTER — Other Ambulatory Visit: Payer: Self-pay | Admitting: Cardiology

## 2017-06-01 DIAGNOSIS — I4891 Unspecified atrial fibrillation: Secondary | ICD-10-CM

## 2017-06-12 ENCOUNTER — Ambulatory Visit: Payer: Medicare Other | Admitting: Family Medicine

## 2017-07-08 DIAGNOSIS — H25813 Combined forms of age-related cataract, bilateral: Secondary | ICD-10-CM | POA: Diagnosis not present

## 2017-07-15 DIAGNOSIS — H52203 Unspecified astigmatism, bilateral: Secondary | ICD-10-CM | POA: Diagnosis not present

## 2017-07-15 DIAGNOSIS — I10 Essential (primary) hypertension: Secondary | ICD-10-CM | POA: Diagnosis not present

## 2017-07-15 DIAGNOSIS — Z79899 Other long term (current) drug therapy: Secondary | ICD-10-CM | POA: Diagnosis not present

## 2017-07-15 DIAGNOSIS — H25811 Combined forms of age-related cataract, right eye: Secondary | ICD-10-CM | POA: Diagnosis not present

## 2017-07-15 DIAGNOSIS — I2510 Atherosclerotic heart disease of native coronary artery without angina pectoris: Secondary | ICD-10-CM | POA: Diagnosis not present

## 2017-07-15 DIAGNOSIS — K219 Gastro-esophageal reflux disease without esophagitis: Secondary | ICD-10-CM | POA: Diagnosis not present

## 2017-07-15 DIAGNOSIS — E785 Hyperlipidemia, unspecified: Secondary | ICD-10-CM | POA: Diagnosis not present

## 2017-07-15 DIAGNOSIS — I252 Old myocardial infarction: Secondary | ICD-10-CM | POA: Diagnosis not present

## 2017-07-15 DIAGNOSIS — H35033 Hypertensive retinopathy, bilateral: Secondary | ICD-10-CM | POA: Diagnosis not present

## 2017-07-15 DIAGNOSIS — H43813 Vitreous degeneration, bilateral: Secondary | ICD-10-CM | POA: Diagnosis not present

## 2017-07-15 DIAGNOSIS — H25813 Combined forms of age-related cataract, bilateral: Secondary | ICD-10-CM | POA: Diagnosis not present

## 2017-07-15 DIAGNOSIS — Z951 Presence of aortocoronary bypass graft: Secondary | ICD-10-CM | POA: Diagnosis not present

## 2017-07-16 DIAGNOSIS — Z961 Presence of intraocular lens: Secondary | ICD-10-CM | POA: Diagnosis not present

## 2017-07-16 DIAGNOSIS — H25812 Combined forms of age-related cataract, left eye: Secondary | ICD-10-CM | POA: Diagnosis not present

## 2017-07-16 DIAGNOSIS — H35033 Hypertensive retinopathy, bilateral: Secondary | ICD-10-CM | POA: Diagnosis not present

## 2017-07-16 DIAGNOSIS — H52202 Unspecified astigmatism, left eye: Secondary | ICD-10-CM | POA: Diagnosis not present

## 2017-07-25 DIAGNOSIS — H35033 Hypertensive retinopathy, bilateral: Secondary | ICD-10-CM | POA: Diagnosis not present

## 2017-07-25 DIAGNOSIS — H52202 Unspecified astigmatism, left eye: Secondary | ICD-10-CM | POA: Diagnosis not present

## 2017-07-25 DIAGNOSIS — H25812 Combined forms of age-related cataract, left eye: Secondary | ICD-10-CM | POA: Diagnosis not present

## 2017-07-25 DIAGNOSIS — Z961 Presence of intraocular lens: Secondary | ICD-10-CM | POA: Diagnosis not present

## 2017-07-28 DIAGNOSIS — L570 Actinic keratosis: Secondary | ICD-10-CM | POA: Diagnosis not present

## 2017-07-28 DIAGNOSIS — D044 Carcinoma in situ of skin of scalp and neck: Secondary | ICD-10-CM | POA: Diagnosis not present

## 2017-07-29 DIAGNOSIS — K219 Gastro-esophageal reflux disease without esophagitis: Secondary | ICD-10-CM | POA: Diagnosis not present

## 2017-07-29 DIAGNOSIS — I2510 Atherosclerotic heart disease of native coronary artery without angina pectoris: Secondary | ICD-10-CM | POA: Diagnosis not present

## 2017-07-29 DIAGNOSIS — I10 Essential (primary) hypertension: Secondary | ICD-10-CM | POA: Diagnosis not present

## 2017-07-29 DIAGNOSIS — Z79899 Other long term (current) drug therapy: Secondary | ICD-10-CM | POA: Diagnosis not present

## 2017-07-29 DIAGNOSIS — H25812 Combined forms of age-related cataract, left eye: Secondary | ICD-10-CM | POA: Diagnosis not present

## 2017-07-29 DIAGNOSIS — E785 Hyperlipidemia, unspecified: Secondary | ICD-10-CM | POA: Diagnosis not present

## 2017-07-29 DIAGNOSIS — Z7901 Long term (current) use of anticoagulants: Secondary | ICD-10-CM | POA: Diagnosis not present

## 2017-07-30 DIAGNOSIS — H35033 Hypertensive retinopathy, bilateral: Secondary | ICD-10-CM | POA: Diagnosis not present

## 2017-07-30 DIAGNOSIS — H02834 Dermatochalasis of left upper eyelid: Secondary | ICD-10-CM | POA: Diagnosis not present

## 2017-07-30 DIAGNOSIS — Z961 Presence of intraocular lens: Secondary | ICD-10-CM | POA: Diagnosis not present

## 2017-07-30 DIAGNOSIS — H02831 Dermatochalasis of right upper eyelid: Secondary | ICD-10-CM | POA: Diagnosis not present

## 2017-07-30 LAB — HM DIABETES EYE EXAM

## 2017-08-05 ENCOUNTER — Encounter: Payer: Self-pay | Admitting: *Deleted

## 2017-08-05 ENCOUNTER — Emergency Department
Admission: EM | Admit: 2017-08-05 | Discharge: 2017-08-05 | Disposition: A | Discharge status: Home / Self Care | Payer: Medicare Other | Source: Home / Self Care | Attending: Family Medicine | Admitting: Family Medicine

## 2017-08-05 DIAGNOSIS — J069 Acute upper respiratory infection, unspecified: Secondary | ICD-10-CM | POA: Diagnosis not present

## 2017-08-05 DIAGNOSIS — B9789 Other viral agents as the cause of diseases classified elsewhere: Secondary | ICD-10-CM

## 2017-08-05 MED ORDER — DOXYCYCLINE HYCLATE 100 MG PO CAPS: 100.0000 mg | ORAL_CAPSULE | Freq: Two times a day (BID) | ORAL | 0 refills | Status: DC

## 2017-08-05 MED ORDER — BENZONATATE 200 MG PO CAPS: ORAL_CAPSULE | ORAL | 0 refills | Status: DC

## 2017-08-05 NOTE — Discharge Instructions (Signed)
Take plain guaifenesin (1200mg  extended release tabs such as Mucinex) twice daily, with plenty of water, for cough and congestion.  Get adequate rest.   May use Afrin nasal spray (or generic oxymetazoline) each morning for about 5 days and then discontinue.  Also recommend using saline nasal spray several times daily and saline nasal irrigation (AYR is a common brand).  Use Flonase nasal spray each morning after using Afrin nasal spray and saline nasal irrigation. Try warm salt water gargles for sore throat.  Stop all antihistamines for now, and other non-prescription cough/cold preparations. May continue Zicam. Begin Doxycycline if not improving about one week or if persistent fever develops   Follow-up with family doctor if not improving about10 days.

## 2017-08-05 NOTE — ED Triage Notes (Signed)
Pt c/o productive cough x 1 week after having cataract surgery. Denies fever.

## 2017-08-05 NOTE — ED Provider Notes (Signed)
[name]    CSN: [id number] Arrival date & time: 08/05/17  1110     History   Chief Complaint Chief Complaint  Patient presents with  . Cough    HPI ISHAM SMITHERMAN is a 83 y.o. male.   One week ago patient underwent cataract surgery.  That evening he developed typical cold-like symptoms developing over several days, including mild sore throat, sinus congestion, fatigue, and cough.  His cough is partly productive and worse at night.  He has had occasional wheezing, but no shortness of breath or pleuritic pain.  No fevers, chills, and sweats.   The history is provided by the patient and a friend.    Past Medical History:  Diagnosis Date  . Anemia   . Antral gastritis 2013   EGD   . Atrial fibrillation ([place])   . CAD (coronary artery disease)    s/p CABG 1993 (L-LAD, S-RI, S-D1);   echo 1/10: EF 55%;    myoview 12/09: inf MI, no ischemia  . Candida esophagitis ([place]) 2013   EGD   . Cataracts, bilateral   . Family history of malignant neoplasm of gastrointestinal tract   . Folliculitis   . Hiatal hernia   . HTN (hypertension)   . Hyperlipidemia   . Irritable bladder   . Myocardial infarction ([place])   . Obesity   . Osteoarthritis     Patient Active Problem List   Diagnosis Date Noted  . CHF (congestive heart failure) ([place]) 12/17/2016  . Arthritis of left ankle 10/15/2016  . PCP NOTES >>>>> 10/11/2015  . Internal hemorrhoid 06/01/2014  . Carpal tunnel syndrome 02/15/2014  . Anemia 08/14/2012  . Annual physical exam 01/08/2012  . Atrial fibrillation ([place]) 03/29/2011  . BRADYCARDIA 06/19/2010  . Venous (peripheral) insufficiency 09/04/2009  . PALPITATIONS 07/07/2009  . OBESITY 07/04/2009  . Lower urinary tract symptoms (LUTS) 07/04/2009  . DJD (degenerative joint disease) 07/04/2009  . SLEEP DISORDER 07/04/2009  . Dyspepsia 02/28/2009  . HEMATOCHEZIA 02/07/2009  . DM II (diabetes mellitus, type II), controlled ([place]) 04/25/2008  . Hyperlipidemia  06/25/2007  . Essential hypertension 06/25/2007  . CORONARY ARTERY DISEASE 06/25/2007    Past Surgical History:  Procedure Laterality Date  . CHOLECYSTECTOMY     laparoscopic '92  . CORONARY ARTERY BYPASS GRAFT     graft '92: LIMA-LAD, SVG - D2,R1, D1  . ENTEROSCOPY  08/22/2012   Procedure: ENTEROSCOPY;  Surgeon: Juanita Craver, MD;  Location: [facility];  Service: Endoscopy;  Laterality: N/A;  . KNEE SURGERY    . MOLE REMOVAL  2001 and 2008  . PILONIDAL CYST EXCISION         Home Medications    Prior to Admission medications   Medication Sig Start Date End Date Taking? Authorizing Provider  atorvastatin (LIPITOR) 80 MG tablet Take 0.5 tablets (40 mg total) by mouth at bedtime. 02/28/16   Lelon Perla, MD  benazepril (LOTENSIN) 40 MG tablet Take 1 tablet (40 mg total) by mouth daily. 04/14/17   Colon Branch, MD  benzonatate (TESSALON) 200 MG capsule Take one cap by mouth at bedtime as needed for cough.  May repeat in 4 to 6 hours 08/05/17   Kandra Nicolas, MD  Cholecalciferol (VITAMIN D) 1000 UNITS capsule Take 1,000 Units by mouth daily.      [provider]  Coenzyme Q10 (COQ10 PO) Take 1 capsule by mouth daily.    [provider]  cyclobenzaprine (FLEXERIL) 5 MG tablet Take  1 at bedtime as needed for muscle relaxant. Caution: May cause drowsiness. 06/16/14   Jacqulyn Cane, MD  diclofenac sodium (VOLTAREN) 1 % GEL Apply 4 g topically 4 (four) times daily. To affected joint. 10/15/16   Gregor Hams, MD  diphenhydramine-acetaminophen (TYLENOL PM) 25-500 MG TABS tablet Take 2 tablets by mouth at bedtime.    [provider]  doxycycline (VIBRAMYCIN) 100 MG capsule Take 1 capsule (100 mg total) by mouth 2 (two) times daily. Take with food (Rx void after 08/13/17) 08/05/17   Kandra Nicolas, MD  furosemide (LASIX) 20 MG tablet Take 2 tablets (40 mg total) by mouth daily. Patient taking differently: Take 20 mg by mouth daily.  01/22/17 04/22/17  Lelon Perla,  MD  furosemide (LASIX) 20 MG tablet Take 1 tablet (20 mg total) by mouth daily. 05/20/17 08/18/17  Lelon Perla, MD  Melatonin 5 MG TABS Take 5 mg by mouth at bedtime.    [provider]  metoprolol succinate (TOPROL-XL) 25 MG 24 hr tablet Take 1 tablet (25 mg total) by mouth daily. 05/06/17   Colon Branch, MD  Multiple Vitamin (MULTIVITAMIN WITH MINERALS) TABS tablet Take 1 tablet by mouth daily.    [provider]  nitroGLYCERIN (NITROSTAT) 0.4 MG SL tablet Place 1 tablet (0.4 mg total) under the tongue every 5 (five) minutes as needed for chest pain. Patient not taking: Reported on 04/17/2017 01/04/15   Lelon Perla, MD  pantoprazole (PROTONIX) 40 MG tablet Take 1 tablet (40 mg total) by mouth daily. 05/10/16   Colon Branch, MD  Probiotic Product (PROBIOTIC DAILY PO) Take 1 tablet by mouth daily.    [provider]  tamsulosin (FLOMAX) 0.4 MG CAPS capsule Take 1 capsule (0.4 mg total) by mouth daily. 04/10/17   Colon Branch, MD  vitamin B-12 (CYANOCOBALAMIN) 1000 MCG tablet Take 1,000 mcg by mouth daily.    [provider]  XARELTO 20 MG TABS tablet TAKE 1 TABLET BY MOUTH  DAILY WITH SUPPER 06/02/17   Lelon Perla, MD    Family History Family History  Problem Relation Age of Onset  . Coronary artery disease Father        and brother  . Heart attack Father        and brother-fatal  . Stroke Father   . Breast cancer Mother   . Alzheimer's disease Mother   . Colon cancer Maternal Uncle   . Esophageal cancer Neg Hx   . Stomach cancer Neg Hx   . Rectal cancer Neg Hx   . Prostate cancer Neg Hx     Social History Social History  Substance Use Topics  . Smoking status: Former Smoker    Years: 16.00    Quit date: 08/14/1964  . Smokeless tobacco: Never Used     Comment: quit 1965  . Alcohol use No     Allergies   Patient has no known allergies.   Review of Systems Review of Systems + sore throat + cough No pleuritic pain ? wheezing +  nasal congestion + post-nasal drainage No sinus pain/pressure No itchy/red eyes No earache No hemoptysis No SOB No fever/chills No nausea No vomiting No abdominal pain No diarrhea No urinary symptoms No skin rash + fatigue No myalgias No headache Used OTC meds without relief   Physical Exam Triage Vital Signs ED Triage Vitals  Enc Vitals Group     BP 08/05/17 1126 122/73     Pulse  Rate 08/05/17 1126 81     Resp 08/05/17 1126 18     Temp 08/05/17 1126 98 F (36.7 C)     Temp Source 08/05/17 1126 Oral     SpO2 08/05/17 1126 94 %     Weight 08/05/17 1126 219 lb (99.3 kg)     Height 08/05/17 1126 5\' 10"  (1.778 m)     Head Circumference --      Peak Flow --      Pain Score 08/05/17 1127 0     Pain Loc --      Pain Edu? --      Excl. in [place]? --    No data found.   Updated Vital Signs BP 122/73 (BP Location: Left Arm)   Pulse 81   Temp 98 F (36.7 C) (Oral)   Resp 18   Ht 5\' 10"  (1.778 m)   Wt 219 lb (99.3 kg)   SpO2 94%   BMI 31.42 kg/m   Visual Acuity Right Eye Distance:   Left Eye Distance:   Bilateral Distance:    Right Eye Near:   Left Eye Near:    Bilateral Near:     Physical Exam Nursing notes and Vital Signs reviewed. Appearance:  Patient appears stated age, and in no acute distress Eyes:  Pupils are equal, round, and reactive to light and accomodation.  Extraocular movement is intact.  Conjunctivae are not inflamed  Ears:  Canals normal.  Tympanic membranes normal.  Nose:  Mildly congested turbinates.  No sinus tenderness.   Pharynx:   Uvula slightly erythematous. Neck:  Supple.  Enlarged posterior/lateral nodes are palpated bilaterally, tender to palpation on the left.   Lungs:  Clear to auscultation.  Breath sounds are equal.  Moving air well. Heart:   Irrigular rate and rhythm without murmurs, rubs, or gallops.  Abdomen:  Nontender without masses or hepatosplenomegaly.  Bowel sounds are present.  No CVA or flank tenderness.  Extremities:   No edema.  Skin:  No rash present.    UC Treatments / Results  Labs (all labs ordered are listed, but only abnormal results are displayed) Labs Reviewed - No data to display  EKG  EKG Interpretation None       Radiology No results found.  Procedures Procedures (including critical care time)  Medications Ordered in UC Medications - No data to display   Initial Impression / Assessment and Plan / UC Course  I have reviewed the triage vital signs and the nursing notes.  Pertinent labs & imaging results that were available during my care of the patient were reviewed by me and considered in my medical decision making (see chart for details).    There is no evidence of bacterial infection today.  Treat symptomatically for now: Prescription written for Benzonatate [facility]) to take at bedtime for night-time cough.  Take plain guaifenesin (1200mg  extended release tabs such as Mucinex) twice daily, with plenty of water, for cough and congestion.  Get adequate rest.   May use Afrin nasal spray (or generic oxymetazoline) each morning for about 5 days and then discontinue.  Also recommend using saline nasal spray several times daily and saline nasal irrigation (AYR is a common brand).  Use Flonase nasal spray each morning after using Afrin nasal spray and saline nasal irrigation. Try warm salt water gargles for sore throat.  Stop all antihistamines for now, and other non-prescription cough/cold preparations. May continue Zicam. Begin Doxycycline if not improving about one week or if  persistent fever develops (Given a prescription to hold, with an expiration date)  Follow-up with family doctor if not improving about10 days.     Final Clinical Impressions(s) / UC Diagnoses   Final diagnoses:  Viral URI with cough    New Prescriptions New Prescriptions   BENZONATATE (TESSALON) 200 MG CAPSULE    Take one cap by mouth at bedtime as needed for cough.  May repeat in 4 to 6 hours    DOXYCYCLINE (VIBRAMYCIN) 100 MG CAPSULE    Take 1 capsule (100 mg total) by mouth 2 (two) times daily. Take with food (Rx void after 08/13/17)        Kandra Nicolas, MD 08/05/17 [phone]

## 2017-08-07 ENCOUNTER — Other Ambulatory Visit: Payer: Self-pay | Admitting: Cardiology

## 2017-08-08 ENCOUNTER — Telehealth: Payer: Self-pay | Admitting: Emergency Medicine

## 2017-08-08 NOTE — Telephone Encounter (Signed)
Hope he is feeling better and your cough is resolving, have a great weekend.

## 2017-08-18 DIAGNOSIS — Z85828 Personal history of other malignant neoplasm of skin: Secondary | ICD-10-CM | POA: Diagnosis not present

## 2017-08-18 DIAGNOSIS — Z08 Encounter for follow-up examination after completed treatment for malignant neoplasm: Secondary | ICD-10-CM | POA: Diagnosis not present

## 2017-08-18 DIAGNOSIS — L821 Other seborrheic keratosis: Secondary | ICD-10-CM | POA: Diagnosis not present

## 2017-09-05 ENCOUNTER — Other Ambulatory Visit: Payer: Self-pay | Admitting: Internal Medicine

## 2017-10-07 ENCOUNTER — Other Ambulatory Visit: Payer: Self-pay | Admitting: Internal Medicine

## 2017-10-11 ENCOUNTER — Other Ambulatory Visit: Payer: Self-pay | Admitting: Internal Medicine

## 2017-10-16 ENCOUNTER — Ambulatory Visit: Payer: Medicare Other | Admitting: Internal Medicine

## 2017-10-16 ENCOUNTER — Encounter: Payer: Self-pay | Admitting: Internal Medicine

## 2017-10-16 ENCOUNTER — Other Ambulatory Visit: Payer: Self-pay

## 2017-10-16 VITALS — BP 126/74 | HR 74 | Temp 98.1°F | Resp 14 | Ht 70.0 in | Wt 217.4 lb

## 2017-10-16 DIAGNOSIS — I509 Heart failure, unspecified: Secondary | ICD-10-CM

## 2017-10-16 DIAGNOSIS — N289 Disorder of kidney and ureter, unspecified: Secondary | ICD-10-CM | POA: Diagnosis not present

## 2017-10-16 DIAGNOSIS — I10 Essential (primary) hypertension: Secondary | ICD-10-CM | POA: Diagnosis not present

## 2017-10-16 DIAGNOSIS — R200 Anesthesia of skin: Secondary | ICD-10-CM | POA: Diagnosis not present

## 2017-10-16 LAB — BASIC METABOLIC PANEL
BUN: 24 mg/dL — AB (ref 6–23)
CALCIUM: 9.8 mg/dL (ref 8.4–10.5)
CO2: 28 mEq/L (ref 19–32)
Chloride: 106 mEq/L (ref 96–112)
Creatinine, Ser: 1.53 mg/dL — ABNORMAL HIGH (ref 0.40–1.50)
GFR: 46.33 mL/min — ABNORMAL LOW (ref >60.00)
GLUCOSE: 141 mg/dL — AB (ref 70–99)
POTASSIUM: 4.4 meq/L (ref 3.5–5.1)
SODIUM: 141 meq/L (ref 135–145)

## 2017-10-16 NOTE — Assessment & Plan Note (Signed)
HTN: Seems well controlled on Lotensin, Lasix 40 mg daily and Toprol.  Check a BMP CHF: Minimal edema noted.  No change. Right hand numbness: Had a right wrist fracture many years ago, numbness in the hand for 1 year, + thenar atrophy.  Recommend Ortho referral. RTC 1 year

## 2017-10-16 NOTE — Progress Notes (Signed)
Subjective:    Patient ID: [name], male    DOB: 17-Mar-1931, 83 y.o.   MRN: [id number]  DOS:  10/16/2017 Type of visit - description : rov Interval history:  CHF: Some edema, mostly at the end of the day, " a dent from the sock'  Also reports numbness, right hand, mostly at the first and second fingers.  Symptoms that started a year ago, on and off.  Symptoms are more persistent now.  Wt Readings from Last 3 Encounters:  10/16/17 217 lb 6 oz (98.6 kg)  08/05/17 219 lb (99.3 kg)  05/15/17 214 lb (97.1 kg)     Review of Systems Denies paresthesias on the left hand or feet No major problems with neck pain.   Past Medical History:  Diagnosis Date  . Anemia   . Antral gastritis 2013   EGD   . Atrial fibrillation ([place])   . CAD (coronary artery disease)    s/p CABG 1993 (L-LAD, S-RI, S-D1);   echo 1/10: EF 55%;    myoview 12/09: inf MI, no ischemia  . Candida esophagitis ([place]) 2013   EGD   . Cataracts, bilateral   . Family history of malignant neoplasm of gastrointestinal tract   . Folliculitis   . Hiatal hernia   . HTN (hypertension)   . Hyperlipidemia   . Irritable bladder   . Myocardial infarction ([place])   . Obesity   . Osteoarthritis     Past Surgical History:  Procedure Laterality Date  . CHOLECYSTECTOMY     laparoscopic '92  . CORONARY ARTERY BYPASS GRAFT     graft '92: LIMA-LAD, SVG - D2,R1, D1  . KNEE SURGERY    . MOLE REMOVAL  2001 and 2008  . PILONIDAL CYST EXCISION      Social History   Socioeconomic History  . Marital status: Widowed    Spouse name: Not on file  . Number of children: 1  . Years of education: Not on file  . Highest education level: Not on file  Social Needs  . Financial resource strain: Not on file  . Food insecurity - worry: Not on file  . Food insecurity - inability: Not on file  . Transportation needs - medical: Not on file  . Transportation needs - non-medical: Not on file  Occupational History  . Occupation: Retired      Fish farm manager: RETIRED  Tobacco Use  . Smoking status: Former Smoker    Years: 16.00    Last attempt to quit: 08/14/1964    Years since quitting: 53.2  . Smokeless tobacco: Never Used  . Tobacco comment: quit 1965  Substance and Sexual Activity  . Alcohol use: No  . Drug use: No  . Sexual activity: Not on file  Other Topics Concern  . 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 Married '57- widowed March 02, 2023. 1 son -'7- died '7 MVA.    Lives by himself   No immediate family. Emergency contact : Daron Offer [phone] (friend)      Allergies as of 10/16/2017   No Known Allergies     Medication List        Accurate as of 10/16/17 10:40 AM. Always use your most recent med list.          atorvastatin 80 MG tablet Commonly known as:  LIPITOR Take 1 tablet (80 mg total) by mouth at bedtime.   benazepril  40 MG tablet Commonly known as:  LOTENSIN Take 1 tablet (40 mg total) by mouth daily.   benzonatate 200 MG capsule Commonly known as:  TESSALON Take one cap by mouth at bedtime as needed for cough.  May repeat in 4 to 6 hours   COQ10 PO Take 1 capsule by mouth daily.   cyclobenzaprine 5 MG tablet Commonly known as:  FLEXERIL Take 1 at bedtime as needed for muscle relaxant. Caution: May cause drowsiness.   diclofenac sodium 1 % Gel Commonly known as:  VOLTAREN Apply 4 g topically 4 (four) times daily. To affected joint.   diphenhydramine-acetaminophen 25-500 MG Tabs tablet Commonly known as:  TYLENOL PM Take 2 tablets by mouth at bedtime.   doxycycline 100 MG capsule Commonly known as:  VIBRAMYCIN Take 1 capsule (100 mg total) by mouth 2 (two) times daily. Take with food (Rx void after 08/13/17)   furosemide 20 MG tablet Commonly known as:  LASIX Take 2 tablets (40 mg total) by mouth daily.   Melatonin 5 MG Tabs Take 5 mg by mouth at bedtime.   metoprolol succinate 25 MG 24 hr tablet Commonly known as:  TOPROL-XL Take 1  tablet (25 mg total) by mouth daily.   multivitamin with minerals Tabs tablet Take 1 tablet by mouth daily.   nitroGLYCERIN 0.4 MG SL tablet Commonly known as:  NITROSTAT Place 1 tablet (0.4 mg total) under the tongue every 5 (five) minutes as needed for chest pain.   pantoprazole 40 MG tablet Commonly known as:  PROTONIX Take 1 tablet (40 mg total) by mouth daily.   PROBIOTIC DAILY PO Take 1 tablet by mouth daily.   tamsulosin 0.4 MG Caps capsule Commonly known as:  FLOMAX Take 1 capsule (0.4 mg total) daily by mouth.   vitamin B-12 1000 MCG tablet Commonly known as:  CYANOCOBALAMIN Take 1,000 mcg by mouth daily.   Vitamin D 1000 units capsule Take 1,000 Units by mouth daily.   XARELTO 20 MG Tabs tablet Generic drug:  rivaroxaban TAKE 1 TABLET BY MOUTH  DAILY WITH SUPPER          Objective:   Physical Exam BP 126/74 (BP Location: Left Arm, Patient Position: Sitting, Cuff Size: Small)   Pulse 74   Temp 98.1 F (36.7 C) (Oral)   Resp 14   Ht 5\' 10"  (1.778 m)   Wt 217 lb 6 oz (98.6 kg)   SpO2 96%   BMI 31.19 kg/m  General:   Well developed, well nourished . NAD.  HEENT:  Normocephalic . Face symmetric, atraumatic Lungs:  CTA B Normal respiratory effort, no intercostal retractions, no accessory muscle use. Heart: irreg ,  no murmur.  No pretibial edema  Noted today Skin: Not pale. Not jaundice MSK: Moderate bony enlargement on the R wrist, + R thenar atrophy. Neurologic:  alert & oriented X3.  Speech normal, gait appropriate for age and unassisted Psych--  Cognition and judgment appear intact.  Cooperative with normal attention span and concentration.  Behavior appropriate. No anxious or depressed appearing.      Assessment & Plan:    Assessment  DM --diet control, no neuropathy HTN Hyperlipidemia CV: --CAD, CABG 1993, Myoview 2009 no ischemia. --Atrial fibrillation -- rate control, xarelto -- chronic combined systolic/diastolic congestive  heart failure Venous insufficiency, mild LE edema L>>R  LUTS:  DRE-PSA wnl 07-2016 MSK: DJD, R wrist Fx in the 80s GI: --Candida esophagitis, antral gastritis --->  2013 per EGD --HH  PLAN: HTN:  Seems well controlled on Lotensin, Lasix 40 mg daily and Toprol.  Check a BMP CHF: Minimal edema noted.  No change. Right hand numbness: Had a right wrist fracture many years ago, numbness in the hand for 1 year, + thenar atrophy.  Recommend Ortho referral. RTC 1 year

## 2017-10-16 NOTE — Progress Notes (Signed)
Pre visit review using our clinic review tool, if applicable. No additional management support is needed unless otherwise documented below in the visit note. 

## 2017-10-16 NOTE — Patient Instructions (Signed)
GO TO THE LAB : Get the blood work     GO TO THE FRONT DESK Schedule your next appointment for a  Physical exam in 6 months    Check the  blood pressure  weekly   Be sure your blood pressure is between 110/65 and  135/85. If it is consistently higher or lower, let me know

## 2017-10-17 NOTE — Addendum Note (Signed)
Addended byDamita Dunnings D on: 10/17/2017 04:41 PM   Modules accepted: Orders

## 2017-10-20 ENCOUNTER — Telehealth: Payer: Self-pay | Admitting: Internal Medicine

## 2017-10-20 ENCOUNTER — Other Ambulatory Visit: Payer: Self-pay | Admitting: Internal Medicine

## 2017-10-20 NOTE — Telephone Encounter (Signed)
Pt states returning call from Friday for lab results. Pt states he is playing golf and will call back later at his convenience.

## 2017-10-20 NOTE — Telephone Encounter (Signed)
Noted  

## 2017-10-20 NOTE — Telephone Encounter (Signed)
Pt returned call for lab results.   CB: [id number]

## 2017-10-20 NOTE — Telephone Encounter (Signed)
Notes recorded by Damita Dunnings, CMA on 10/20/2017 at 1:09 PM EST LMOM (mobile) informing Pt of lab results and recommendations. Instructed Pt to call office to schedule lab appt to be completed in 6 weeks. BMP ordered. ------  Notes recorded by Damita Dunnings, [place] on 10/17/2017 at 4:40 PM EST LMOM (home) informing Pt to return call regarding labs. ------  Notes recorded by Colon Branch, MD on 10/17/2017 at 2:30 PM EST Please call the patient. Kidney function slightly decreased compared to previous months. Advised patient to come back in 6 weeks (after the holidays) to recheck. (BMP dx HTN)  May need to adjust his BP meds. Avoid any OTC NSAIDs such as naproxen, Goody powders, ibuprofen. To keep himself hydrated with plenty of water.

## 2017-10-21 ENCOUNTER — Encounter (INDEPENDENT_AMBULATORY_CARE_PROVIDER_SITE_OTHER): Payer: Self-pay | Admitting: Orthopaedic Surgery

## 2017-10-21 ENCOUNTER — Ambulatory Visit (INDEPENDENT_AMBULATORY_CARE_PROVIDER_SITE_OTHER): Payer: Medicare Other

## 2017-10-21 ENCOUNTER — Ambulatory Visit (INDEPENDENT_AMBULATORY_CARE_PROVIDER_SITE_OTHER): Payer: Medicare Other | Admitting: Orthopaedic Surgery

## 2017-10-21 DIAGNOSIS — G5601 Carpal tunnel syndrome, right upper limb: Secondary | ICD-10-CM

## 2017-10-21 NOTE — Progress Notes (Signed)
Office Visit Note   Patient: [name]           Date of Birth: 09-Oct-1931           MRN: [id number] Visit Date: 10/21/2017              Requested by: Colon Branch, [address] [place], [place] [address] PCP: Colon Branch, MD   Assessment & Plan: Visit Diagnoses:  1. Right carpal tunnel syndrome     Plan: Impression is severe right carpal tunnel syndrome likely secondary to distal radius malunion.  We reviewed the x-rays with the patient and the likely cause of his carpal tunnel syndrome.  Recommend nerve conduction studies to confirm this.  Follow-up after the studies.  Follow-Up Instructions: Return if symptoms worsen or fail to improve.   Orders:  Orders Placed This Encounter  Procedures  . XR Wrist Complete Right  . Ambulatory referral to Physical Medicine Rehab   No orders of the defined types were placed in this encounter.     Procedures: No procedures performed   Clinical Data: No additional findings.   Subjective: Chief Complaint  Patient presents with  . Right Hand - Numbness    Patient is a 83 year old gentleman who comes in with right hand numbness for about a year.  He previously had a right distal radius fracture that was treated nonoperatively.  He is now complaining of pain and numbness in the median nerve distribution.  His pain is worse at night.  He has tried wearing wrist braces without any relief.    Review of Systems  Constitutional: Negative.   All other systems reviewed and are negative.    Objective: Vital Signs: There were no vitals taken for this visit.  Physical Exam  Constitutional: He is oriented to person, place, and time. He appears well-developed and well-nourished.  HENT:  Head: Normocephalic and atraumatic.  Eyes: Pupils are equal, round, and reactive to light.  Neck: Neck supple.  Pulmonary/Chest: Effort normal.  Abdominal: Soft.  Musculoskeletal: Normal range of motion.  Neurological: He is alert  and oriented to person, place, and time.  Skin: Skin is warm.  Psychiatric: He has a normal mood and affect. His behavior is normal. Judgment and thought content normal.  Nursing note and vitals reviewed.   Ortho Exam Right wrist exam and hand exam show mildly positive carpal tunnel compressive signs.  He has atrophy of the thenar compartment.  APB function is weak. Specialty Comments:  No specialty comments available.  Imaging: Xr Wrist Complete Right  Result Date: 10/21/2017 Malunion of right distal radius fracture with dorsal tilt of the distal radius and radial height and radial inclination loss.  Radio carpal arthritis.    PMFS History: Patient Active Problem List   Diagnosis Date Noted  . CHF (congestive heart failure) ([place]) 12/17/2016  . Arthritis of left ankle 10/15/2016  . PCP NOTES >>>>> 10/11/2015  . Internal hemorrhoid 06/01/2014  . Carpal tunnel syndrome 02/15/2014  . Anemia 08/14/2012  . Annual physical exam 01/08/2012  . Atrial fibrillation ([place]) 03/29/2011  . BRADYCARDIA 06/19/2010  . Venous (peripheral) insufficiency 09/04/2009  . PALPITATIONS 07/07/2009  . OBESITY 07/04/2009  . Lower urinary tract symptoms (LUTS) 07/04/2009  . DJD (degenerative joint disease) 07/04/2009  . SLEEP DISORDER 07/04/2009  . Dyspepsia 02/28/2009  . HEMATOCHEZIA 02/07/2009  . DM II (diabetes mellitus, type II), controlled ([place]) 04/25/2008  . Hyperlipidemia 06/25/2007  . Essential hypertension 06/25/2007  .  CORONARY ARTERY DISEASE 06/25/2007   Past Medical History:  Diagnosis Date  . Anemia   . Antral gastritis 2013   EGD   . Atrial fibrillation ([place])   . CAD (coronary artery disease)    s/p CABG 1993 (L-LAD, S-RI, S-D1);   echo 1/10: EF 55%;    myoview 12/09: inf MI, no ischemia  . Candida esophagitis ([place]) 2013   EGD   . Cataracts, bilateral   . Family history of malignant neoplasm of gastrointestinal tract   . Folliculitis   . Hiatal hernia   . HTN (hypertension)    . Hyperlipidemia   . Irritable bladder   . Myocardial infarction ([place])   . Obesity   . Osteoarthritis     Family History  Problem Relation Age of Onset  . Coronary artery disease Father        and brother  . Heart attack Father        and brother-fatal  . Stroke Father   . Breast cancer Mother   . Alzheimer's disease Mother   . Colon cancer Maternal Uncle   . Esophageal cancer Neg Hx   . Stomach cancer Neg Hx   . Rectal cancer Neg Hx   . Prostate cancer Neg Hx     Past Surgical History:  Procedure Laterality Date  . CHOLECYSTECTOMY     laparoscopic '92  . CORONARY ARTERY BYPASS GRAFT     graft '92: LIMA-LAD, SVG - D2,R1, D1  . KNEE SURGERY    . MOLE REMOVAL  2001 and 2008  . PILONIDAL CYST EXCISION     Social History   Occupational History  . Occupation: Retired    Fish farm manager: RETIRED  Tobacco Use  . Smoking status: Former Smoker    Years: 16.00    Last attempt to quit: 08/14/1964    Years since quitting: 53.2  . Smokeless tobacco: Never Used  . Tobacco comment: quit 1965  Substance and Sexual Activity  . Alcohol use: No  . Drug use: No  . Sexual activity: Not on file

## 2017-11-06 ENCOUNTER — Other Ambulatory Visit: Payer: Self-pay | Admitting: Cardiology

## 2017-11-07 ENCOUNTER — Encounter (INDEPENDENT_AMBULATORY_CARE_PROVIDER_SITE_OTHER): Payer: Self-pay | Admitting: Physical Medicine and Rehabilitation

## 2017-11-07 ENCOUNTER — Ambulatory Visit (INDEPENDENT_AMBULATORY_CARE_PROVIDER_SITE_OTHER): Payer: Medicare Other | Admitting: Physical Medicine and Rehabilitation

## 2017-11-07 DIAGNOSIS — R202 Paresthesia of skin: Secondary | ICD-10-CM

## 2017-11-07 NOTE — Progress Notes (Deleted)
Pain and numbness in second, third, and fourth fingers of right hand. Third finger is the worst. Symptoms are constant. Right hand dominant.

## 2017-11-10 NOTE — Progress Notes (Signed)
[name] - 83 y.o. male MRN [id number]  Date of birth: 14-Jul-1931  Office Visit Note: Visit Date: 11/07/2017 PCP: Colon Branch, MD Referred by: Colon Branch, MD  Subjective: Chief Complaint  Patient presents with  . Right Hand - Pain, Numbness   HPI: [name] is an 83 year old right-hand-dominant gentleman with a history of right distal radial malunion.  He has had chronic worsening numbness and weakness and pain in the right hand.  The numbness is mainly in the first 3 digits and half the fourth digit on the right.  He is reported significant weakness and difficulty with small objects and dropping things.  He has a history of using his right hand fairly extensively over the years.  He denies any real left-sided symptoms.  He has been wearing braces without relief.  Medications offered no relief.    ROS Otherwise per HPI.  Assessment & Plan: Visit Diagnoses:  1. Paresthesia of skin     Plan: No additional findings.  Impression: The above electrodiagnostic study is ABNORMAL and reveals evidence of a very severe right median nerve entrapment at the wrist (carpal tunnel syndrome) affecting sensory and motor components. The lesion is characterized by sensory and motor demyelination with evidence of significant axonal injury.  There are positive motor unit action potentials on needle EMG which portends potential sprouting and reinnervation at least some in terms of motor function.  He likely has some permanent deficits.   There is no significant electrodiagnostic evidence of any other focal nerve entrapment, brachial plexopathy or cervical radiculopathy.   Recommendations: 1.  Follow-up with referring physician. 2.  Continue current management of symptoms. 3.  Suggest surgical evaluation.   Meds & Orders: No orders of the defined types were placed in this encounter.   Orders Placed This Encounter  Procedures  . NCV with EMG (electromyography)    Follow-up: Return for Dr. Erlinda Hong.    Procedures: No procedures performed  EMG & NCV Findings: Evaluation of the right median motor nerve showed no response (Elbow), prolonged distal onset latency (7.1 ms), and reduced amplitude (0.1 mV).  The right median (across palm) sensory nerve showed no response (Wrist) and no response (Palm).  The right ulnar sensory nerve showed prolonged distal peak latency (4.1 ms), reduced amplitude (12.4 V), and decreased conduction velocity (Wrist-5th Digit, 34 m/s).  All remaining nerves (as indicated in the following tables) were within normal limits.    Needle evaluation of the right abductor pollicis brevis muscle showed decreased insertional activity and widespread spontaneous activity.  All remaining muscles (as indicated in the following table) showed no evidence of electrical instability.    Impression: The above electrodiagnostic study is ABNORMAL and reveals evidence of a very severe right median nerve entrapment at the wrist (carpal tunnel syndrome) affecting sensory and motor components. The lesion is characterized by sensory and motor demyelination with evidence of significant axonal injury.  There are positive motor unit action potentials on needle EMG which portends potential sprouting and reinnervation at least some in terms of motor function.  He likely has some permanent deficits.   There is no significant electrodiagnostic evidence of any other focal nerve entrapment, brachial plexopathy or cervical radiculopathy.   Recommendations: 1.  Follow-up with referring physician. 2.  Continue current management of symptoms. 3.  Suggest surgical evaluation.    Nerve Conduction Studies Anti Sensory Summary Table   Stim Site NR Peak (ms) Norm Peak (ms) P-T Amp (V) Norm P-T Amp Site1  Site2 Delta-P (ms) Dist (cm) Vel (m/s) Norm Vel (m/s)  Right Median Acr Palm Anti Sensory (2nd Digit)  32.9C  Wrist *NR  <3.6  >10 Wrist Palm  0.0    Palm *NR  <2.0          Site 3    5.4  2.8          Right Radial Anti Sensory (Base 1st Digit)  32C  Wrist    2.6 <3.1 10.4  Wrist Base 1st Digit 2.6 0.0    Right Ulnar Anti Sensory (5th Digit)  32.4C  Wrist    *4.1 <3.7 *12.4 >15.0 Wrist 5th Digit 4.1 14.0 *34 >38   Motor Summary Table   Stim Site NR Onset (ms) Norm Onset (ms) O-P Amp (mV) Norm O-P Amp Site1 Site2 Delta-0 (ms) Dist (cm) Vel (m/s) Norm Vel (m/s)  Right Median Motor (Abd Poll Brev)  31.4C  Wrist    *7.1 <4.2 *0.1 >5 Elbow Wrist  0.0  >50  Elbow *NR     Axilla Elbow  0.0    Axilla    8.3  0.0         Right Ulnar Motor (Abd Dig Min)  31.3C  Wrist    3.8 <4.2 8.4 >3 B Elbow Wrist 4.4 24.0 55 >53  B Elbow    8.2  7.0  A Elbow B Elbow 1.9 11.0 58 >53  A Elbow    10.1  7.2          EMG   Side Muscle Nerve Root Ins Act Fibs Psw Amp Dur Poly Recrt Int Fraser Din Comment  Right Abd Poll Brev Median C8-T1 *Decr *4+ *4+     0      pos MUAP  Right 1stDorInt Ulnar C8-T1 Nml Nml Nml Nml Nml 0 Nml Nml   Right PronatorTeres Median C6-7 Nml Nml Nml Nml Nml 0 Nml Nml     Nerve Conduction Studies Anti Sensory Left/Right Comparison   Stim Site L Lat (ms) R Lat (ms) L-R Lat (ms) L Amp (V) R Amp (V) L-R Amp (%) Site1 Site2 L Vel (m/s) R Vel (m/s) L-R Vel (m/s)  Median Acr Palm Anti Sensory (2nd Digit)  32.9C  Wrist       Wrist Palm     Palm             Site 3  5.4   2.8        Radial Anti Sensory (Base 1st Digit)  32C  Wrist  2.6   10.4  Wrist Base 1st Digit     Ulnar Anti Sensory (5th Digit)  32.4C  Wrist  *4.1   *12.4  Wrist 5th Digit  *34    Motor Left/Right Comparison   Stim Site L Lat (ms) R Lat (ms) L-R Lat (ms) L Amp (mV) R Amp (mV) L-R Amp (%) Site1 Site2 L Vel (m/s) R Vel (m/s) L-R Vel (m/s)  Median Motor (Abd Poll Brev)  31.4C  Wrist  *7.1   *0.1  Elbow Wrist     Elbow       Axilla Elbow     Axilla  8.3   0.0        Ulnar Motor (Abd Dig Min)  31.3C  Wrist  3.8   8.4  B Elbow Wrist  55   B Elbow  8.2   7.0  A Elbow B Elbow  58   A Elbow  10.1   7.2  Waveforms:            Clinical History: No specialty comments available.  He reports that he quit smoking about 53 years ago. He quit after 16.00 years of use. he has never used smokeless tobacco.  Recent Labs    12/16/16 1141 04/17/17 0939  HGBA1C 6.2 6.1    Objective:  VS:  HT:    WT:   BMI:     BP:   HR: bpm  TEMP: ( )  RESP:  Physical Exam  Musculoskeletal:  Inspection reveals significant atrophy of the right APB but no atrophy of the left APB or bilateral FDI or hand intrinsics. There is no swelling, color changes, allodynia or dystrophic changes. There is 3/5 right APB abduction and 5 out of 5 strength in the bilateral wrist extension, finger abduction and long finger flexion. There is decreased sensation to light touch in right median nerve distribution. . There is a negative Hoffmann's test bilaterally.    Ortho Exam Imaging: No results found.  Past Medical/Family/Surgical/Social History: Medications & Allergies reviewed per EMR Patient Active Problem List   Diagnosis Date Noted  . CHF (congestive heart failure) ([place]) 12/17/2016  . Arthritis of left ankle 10/15/2016  . PCP NOTES >>>>> 10/11/2015  . Internal hemorrhoid 06/01/2014  . Carpal tunnel syndrome 02/15/2014  . Anemia 08/14/2012  . Annual physical exam 01/08/2012  . Atrial fibrillation ([place]) 03/29/2011  . BRADYCARDIA 06/19/2010  . Venous (peripheral) insufficiency 09/04/2009  . PALPITATIONS 07/07/2009  . OBESITY 07/04/2009  . Lower urinary tract symptoms (LUTS) 07/04/2009  . DJD (degenerative joint disease) 07/04/2009  . SLEEP DISORDER 07/04/2009  . Dyspepsia 02/28/2009  . HEMATOCHEZIA 02/07/2009  . DM II (diabetes mellitus, type II), controlled ([place]) 04/25/2008  . Hyperlipidemia 06/25/2007  . Essential hypertension 06/25/2007  . CORONARY ARTERY DISEASE 06/25/2007   Past Medical History:  Diagnosis Date  . Anemia   . Antral gastritis 2013   EGD   . Atrial fibrillation ([place])   . CAD  (coronary artery disease)    s/p CABG 1993 (L-LAD, S-RI, S-D1);   echo 1/10: EF 55%;    myoview 12/09: inf MI, no ischemia  . Candida esophagitis ([place]) 2013   EGD   . Cataracts, bilateral   . Family history of malignant neoplasm of gastrointestinal tract   . Folliculitis   . Hiatal hernia   . HTN (hypertension)   . Hyperlipidemia   . Irritable bladder   . Myocardial infarction ([place])   . Obesity   . Osteoarthritis    Family History  Problem Relation Age of Onset  . Coronary artery disease Father        and brother  . Heart attack Father        and brother-fatal  . Stroke Father   . Breast cancer Mother   . Alzheimer's disease Mother   . Colon cancer Maternal Uncle   . Esophageal cancer Neg Hx   . Stomach cancer Neg Hx   . Rectal cancer Neg Hx   . Prostate cancer Neg Hx    Past Surgical History:  Procedure Laterality Date  . CHOLECYSTECTOMY     laparoscopic '92  . CORONARY ARTERY BYPASS GRAFT     graft '92: LIMA-LAD, SVG - D2,R1, D1  . ENTEROSCOPY  08/22/2012   Procedure: ENTEROSCOPY;  Surgeon: Juanita Craver, MD;  Location: [facility];  Service: Endoscopy;  Laterality: N/A;  . KNEE SURGERY    . MOLE REMOVAL  2001 and 2008  .  PILONIDAL CYST EXCISION     Social History   Occupational History  . Occupation: Retired    Fish farm manager: RETIRED  Tobacco Use  . Smoking status: Former Smoker    Years: 16.00    Last attempt to quit: 08/14/1964    Years since quitting: 53.2  . Smokeless tobacco: Never Used  . Tobacco comment: quit 1965  Substance and Sexual Activity  . Alcohol use: No  . Drug use: No  . Sexual activity: Not on file

## 2017-11-10 NOTE — Procedures (Signed)
EMG & NCV Findings: Evaluation of the right median motor nerve showed no response (Elbow), prolonged distal onset latency (7.1 ms), and reduced amplitude (0.1 mV).  The right median (across palm) sensory nerve showed no response (Wrist) and no response (Palm).  The right ulnar sensory nerve showed prolonged distal peak latency (4.1 ms), reduced amplitude (12.4 V), and decreased conduction velocity (Wrist-5th Digit, 34 m/s).  All remaining nerves (as indicated in the following tables) were within normal limits.    Needle evaluation of the right abductor pollicis brevis muscle showed decreased insertional activity and widespread spontaneous activity.  All remaining muscles (as indicated in the following table) showed no evidence of electrical instability.    Impression: The above electrodiagnostic study is ABNORMAL and reveals evidence of a very severe right median nerve entrapment at the wrist (carpal tunnel syndrome) affecting sensory and motor components. The lesion is characterized by sensory and motor demyelination with evidence of significant axonal injury.  There are positive motor unit action potentials on needle EMG which portends potential sprouting and reinnervation at least some in terms of motor function.  He likely has some permanent deficits.   There is no significant electrodiagnostic evidence of any other focal nerve entrapment, brachial plexopathy or cervical radiculopathy.   Recommendations: 1.  Follow-up with referring physician. 2.  Continue current management of symptoms. 3.  Suggest surgical evaluation.    Nerve Conduction Studies Anti Sensory Summary Table   Stim Site NR Peak (ms) Norm Peak (ms) P-T Amp (V) Norm P-T Amp Site1 Site2 Delta-P (ms) Dist (cm) Vel (m/s) Norm Vel (m/s)  Right Median Acr Palm Anti Sensory (2nd Digit)  32.9C  Wrist *NR  <3.6  >10 Wrist Palm  0.0    Palm *NR  <2.0          Site 3    5.4  2.8         Right Radial Anti Sensory (Base 1st Digit)   32C  Wrist    2.6 <3.1 10.4  Wrist Base 1st Digit 2.6 0.0    Right Ulnar Anti Sensory (5th Digit)  32.4C  Wrist    *4.1 <3.7 *12.4 >15.0 Wrist 5th Digit 4.1 14.0 *34 >38   Motor Summary Table   Stim Site NR Onset (ms) Norm Onset (ms) O-P Amp (mV) Norm O-P Amp Site1 Site2 Delta-0 (ms) Dist (cm) Vel (m/s) Norm Vel (m/s)  Right Median Motor (Abd Poll Brev)  31.4C  Wrist    *7.1 <4.2 *0.1 >5 Elbow Wrist  0.0  >50  Elbow *NR     Axilla Elbow  0.0    Axilla    8.3  0.0         Right Ulnar Motor (Abd Dig Min)  31.3C  Wrist    3.8 <4.2 8.4 >3 B Elbow Wrist 4.4 24.0 55 >53  B Elbow    8.2  7.0  A Elbow B Elbow 1.9 11.0 58 >53  A Elbow    10.1  7.2          EMG   Side Muscle Nerve Root Ins Act Fibs Psw Amp Dur Poly Recrt Int Fraser Din Comment  Right Abd Poll Brev Median C8-T1 *Decr *4+ *4+     0      pos MUAP  Right 1stDorInt Ulnar C8-T1 Nml Nml Nml Nml Nml 0 Nml Nml   Right PronatorTeres Median C6-7 Nml Nml Nml Nml Nml 0 Nml Nml     Nerve Conduction Studies Anti Sensory  Left/Right Comparison   Stim Site L Lat (ms) R Lat (ms) L-R Lat (ms) L Amp (V) R Amp (V) L-R Amp (%) Site1 Site2 L Vel (m/s) R Vel (m/s) L-R Vel (m/s)  Median Acr Palm Anti Sensory (2nd Digit)  32.9C  Wrist       Wrist Palm     Palm             Site 3  5.4   2.8        Radial Anti Sensory (Base 1st Digit)  32C  Wrist  2.6   10.4  Wrist Base 1st Digit     Ulnar Anti Sensory (5th Digit)  32.4C  Wrist  *4.1   *12.4  Wrist 5th Digit  *34    Motor Left/Right Comparison   Stim Site L Lat (ms) R Lat (ms) L-R Lat (ms) L Amp (mV) R Amp (mV) L-R Amp (%) Site1 Site2 L Vel (m/s) R Vel (m/s) L-R Vel (m/s)  Median Motor (Abd Poll Brev)  31.4C  Wrist  *7.1   *0.1  Elbow Wrist     Elbow       Axilla Elbow     Axilla  8.3   0.0        Ulnar Motor (Abd Dig Min)  31.3C  Wrist  3.8   8.4  B Elbow Wrist  55   B Elbow  8.2   7.0  A Elbow B Elbow  58   A Elbow  10.1   7.2           Waveforms:

## 2017-11-11 ENCOUNTER — Ambulatory Visit (INDEPENDENT_AMBULATORY_CARE_PROVIDER_SITE_OTHER): Payer: Medicare Other | Admitting: Orthopaedic Surgery

## 2017-11-11 DIAGNOSIS — G5601 Carpal tunnel syndrome, right upper limb: Secondary | ICD-10-CM | POA: Diagnosis not present

## 2017-11-11 NOTE — Progress Notes (Signed)
Office Visit Note   Patient: [name]           Date of Birth: Jun 10, 1931           MRN: [id number] Visit Date: 11/11/2017              Requested by: Colon Branch, [address] [place],  [id number] PCP: Colon Branch, MD   Assessment & Plan: Visit Diagnoses:  1. Right carpal tunnel syndrome     Plan: Impression is very severe right carpal tunnel syndrome with median nerve demyelination and axonal injury.  Discussed with the patient that carpal tunnel release is indicated.  He understands they will likely not get full relief and resolution given the severity of his condition.  He understands risk benefits alternatives of surgery and wishes to proceed.  We will have him stop taking his Xarelto 2 days in advance in preparation for the surgery.  Follow-Up Instructions: Return if symptoms worsen or fail to improve.   Orders:  No orders of the defined types were placed in this encounter.  No orders of the defined types were placed in this encounter.     Procedures: No procedures performed   Clinical Data: No additional findings.   Subjective: No chief complaint on file.   Patient comes in for review of nerve conduction studies.  Denies any changes in medical history.    Review of Systems  Constitutional: Negative.   All other systems reviewed and are negative.    Objective: Vital Signs: There were no vitals taken for this visit.  Physical Exam  Constitutional: He is oriented to person, place, and time. He appears well-developed and well-nourished.  Pulmonary/Chest: Effort normal.  Abdominal: Soft.  Neurological: He is alert and oriented to person, place, and time.  Skin: Skin is warm.  Psychiatric: He has a normal mood and affect. His behavior is normal. Judgment and thought content normal.  Nursing note and vitals reviewed.   Ortho Exam Right hand exam shows atrophy of the thenar muscle. Specialty Comments:  No specialty comments  available.  Imaging: No results found.   PMFS History: Patient Active Problem List   Diagnosis Date Noted  . CHF (congestive heart failure) ([place]) 12/17/2016  . Arthritis of left ankle 10/15/2016  . PCP NOTES >>>>> 10/11/2015  . Internal hemorrhoid 06/01/2014  . Carpal tunnel syndrome 02/15/2014  . Anemia 08/14/2012  . Annual physical exam 01/08/2012  . Atrial fibrillation ([place]) 03/29/2011  . BRADYCARDIA 06/19/2010  . Venous (peripheral) insufficiency 09/04/2009  . PALPITATIONS 07/07/2009  . OBESITY 07/04/2009  . Lower urinary tract symptoms (LUTS) 07/04/2009  . DJD (degenerative joint disease) 07/04/2009  . SLEEP DISORDER 07/04/2009  . Dyspepsia 02/28/2009  . HEMATOCHEZIA 02/07/2009  . DM II (diabetes mellitus, type II), controlled ([place]) 04/25/2008  . Hyperlipidemia 06/25/2007  . Essential hypertension 06/25/2007  . CORONARY ARTERY DISEASE 06/25/2007   Past Medical History:  Diagnosis Date  . Anemia   . Antral gastritis 2013   EGD   . Atrial fibrillation ([place])   . CAD (coronary artery disease)    s/p CABG 1993 (L-LAD, S-RI, S-D1);   echo 1/10: EF 55%;    myoview 12/09: inf MI, no ischemia  . Candida esophagitis ([place]) 2013   EGD   . Cataracts, bilateral   . Family history of malignant neoplasm of gastrointestinal tract   . Folliculitis   . Hiatal hernia   . HTN (hypertension)   .  Hyperlipidemia   . Irritable bladder   . Myocardial infarction ([place])   . Obesity   . Osteoarthritis     Family History  Problem Relation Age of Onset  . Coronary artery disease Father        and brother  . Heart attack Father        and brother-fatal  . Stroke Father   . Breast cancer Mother   . Alzheimer's disease Mother   . Colon cancer Maternal Uncle   . Esophageal cancer Neg Hx   . Stomach cancer Neg Hx   . Rectal cancer Neg Hx   . Prostate cancer Neg Hx     Past Surgical History:  Procedure Laterality Date  . CHOLECYSTECTOMY     laparoscopic '92  . CORONARY ARTERY  BYPASS GRAFT     graft '92: LIMA-LAD, SVG - D2,R1, D1  . ENTEROSCOPY  08/22/2012   Procedure: ENTEROSCOPY;  Surgeon: Juanita Craver, MD;  Location: [facility];  Service: Endoscopy;  Laterality: N/A;  . KNEE SURGERY    . MOLE REMOVAL  2001 and 2008  . PILONIDAL CYST EXCISION     Social History   Occupational History  . Occupation: Retired    Fish farm manager: RETIRED  Tobacco Use  . Smoking status: Former Smoker    Years: 16.00    Last attempt to quit: 08/14/1964    Years since quitting: 53.2  . Smokeless tobacco: Never Used  . Tobacco comment: quit 1965  Substance and Sexual Activity  . Alcohol use: No  . Drug use: No  . Sexual activity: Not on file

## 2017-11-14 DIAGNOSIS — G5601 Carpal tunnel syndrome, right upper limb: Secondary | ICD-10-CM | POA: Diagnosis not present

## 2017-11-24 ENCOUNTER — Encounter (INDEPENDENT_AMBULATORY_CARE_PROVIDER_SITE_OTHER): Payer: Self-pay | Admitting: Orthopaedic Surgery

## 2017-11-24 ENCOUNTER — Ambulatory Visit (INDEPENDENT_AMBULATORY_CARE_PROVIDER_SITE_OTHER): Payer: Medicare Other | Admitting: Orthopaedic Surgery

## 2017-11-24 DIAGNOSIS — G5601 Carpal tunnel syndrome, right upper limb: Secondary | ICD-10-CM

## 2017-11-24 NOTE — Progress Notes (Signed)
Patient ID: [name], male   DOB: [birth year], 83 y.o.   MRN: [id number]  Patient is 10 days status post right carpal tunnel release.  He is doing well.  Denies any pain.  He does have a little bit of numbness but this is also improved.  Surgical incision is healed without signs of infection or drainage.  Neurovascularly intact.  Sutures were removed today.  Patient instructed to apply Neosporin ointment to the incision until fully healed.  Follow-up in 4 weeks for recheck.

## 2017-11-26 ENCOUNTER — Encounter (INDEPENDENT_AMBULATORY_CARE_PROVIDER_SITE_OTHER): Payer: Self-pay | Admitting: Family

## 2017-11-26 ENCOUNTER — Ambulatory Visit (INDEPENDENT_AMBULATORY_CARE_PROVIDER_SITE_OTHER): Payer: Medicare Other | Admitting: Family

## 2017-11-26 VITALS — Ht 70.0 in | Wt 217.0 lb

## 2017-11-26 DIAGNOSIS — G5601 Carpal tunnel syndrome, right upper limb: Secondary | ICD-10-CM

## 2017-11-26 NOTE — Progress Notes (Signed)
Post-Op Visit Note   Patient: [name]           Date of Birth: August 16, 1931           MRN: [id number] Visit Date: 11/26/2017 PCP: Colon Branch, MD  Chief Complaint:  Chief Complaint  Patient presents with  . Right Wrist - Injury    Pt walked in s/p CTR states incision "opened up"     HPI:  HPI Patient is an 83 year old gentleman seen today as a work in. Had his sutures harvested 2 days ago. Today is concerned that his incision has opened up. No drainage. No pain. States has not been doing any manual labor. Applying neosporin dressings daily.  Ortho Exam Incision has opened up a little. Full length open, 1 mm wide. Is 1 mm deep. Granulation in wound bed. Does not probe. No drainage. No erythema or tenderness surrounding.  Visit Diagnoses:  1. Carpal tunnel syndrome of right wrist     Plan: cleanse daily with dial soap. Apply neosporin dressings. Discussed return precautions. Follow up in office as scheduled.  Follow-Up Instructions: Return for as scheduled.   Imaging: No results found.  Orders:  No orders of the defined types were placed in this encounter.  No orders of the defined types were placed in this encounter.    PMFS History: Patient Active Problem List   Diagnosis Date Noted  . CHF (congestive heart failure) ([place]) 12/17/2016  . Arthritis of left ankle 10/15/2016  . PCP NOTES >>>>> 10/11/2015  . Internal hemorrhoid 06/01/2014  . Carpal tunnel syndrome 02/15/2014  . Anemia 08/14/2012  . Annual physical exam 01/08/2012  . Atrial fibrillation ([place]) 03/29/2011  . BRADYCARDIA 06/19/2010  . Venous (peripheral) insufficiency 09/04/2009  . PALPITATIONS 07/07/2009  . OBESITY 07/04/2009  . Lower urinary tract symptoms (LUTS) 07/04/2009  . DJD (degenerative joint disease) 07/04/2009  . SLEEP DISORDER 07/04/2009  . Dyspepsia 02/28/2009  . HEMATOCHEZIA 02/07/2009  . DM II (diabetes mellitus, type II), controlled ([place]) 04/25/2008  . Hyperlipidemia 06/25/2007    . Essential hypertension 06/25/2007  . CORONARY ARTERY DISEASE 06/25/2007   Past Medical History:  Diagnosis Date  . Anemia   . Antral gastritis 2013   EGD   . Atrial fibrillation ([place])   . CAD (coronary artery disease)    s/p CABG 1993 (L-LAD, S-RI, S-D1);   echo 1/10: EF 55%;    myoview 12/09: inf MI, no ischemia  . Candida esophagitis ([place]) 2013   EGD   . Cataracts, bilateral   . Family history of malignant neoplasm of gastrointestinal tract   . Folliculitis   . Hiatal hernia   . HTN (hypertension)   . Hyperlipidemia   . Irritable bladder   . Myocardial infarction ([place])   . Obesity   . Osteoarthritis     Family History  Problem Relation Age of Onset  . Coronary artery disease Father        and brother  . Heart attack Father        and brother-fatal  . Stroke Father   . Breast cancer Mother   . Alzheimer's disease Mother   . Colon cancer Maternal Uncle   . Esophageal cancer Neg Hx   . Stomach cancer Neg Hx   . Rectal cancer Neg Hx   . Prostate cancer Neg Hx     Past Surgical History:  Procedure Laterality Date  . CHOLECYSTECTOMY     laparoscopic '92  . CORONARY ARTERY  BYPASS GRAFT     graft '92: LIMA-LAD, SVG - D2,R1, D1  . ENTEROSCOPY  08/22/2012   Procedure: ENTEROSCOPY;  Surgeon: Juanita Craver, MD;  Location: [facility];  Service: Endoscopy;  Laterality: N/A;  . KNEE SURGERY    . MOLE REMOVAL  2001 and 2008  . PILONIDAL CYST EXCISION     Social History   Occupational History  . Occupation: Retired    Fish farm manager: RETIRED  Tobacco Use  . Smoking status: Former Smoker    Years: 16.00    Last attempt to quit: 08/14/1964    Years since quitting: 53.3  . Smokeless tobacco: Never Used  . Tobacco comment: quit 1965  Substance and Sexual Activity  . Alcohol use: No  . Drug use: No  . Sexual activity: Not on file

## 2017-12-09 ENCOUNTER — Other Ambulatory Visit: Payer: Self-pay | Admitting: Cardiology

## 2017-12-09 DIAGNOSIS — I4891 Unspecified atrial fibrillation: Secondary | ICD-10-CM

## 2017-12-10 ENCOUNTER — Telehealth: Payer: Self-pay | Admitting: Cardiology

## 2017-12-10 DIAGNOSIS — H0100B Unspecified blepharitis left eye, upper and lower eyelids: Secondary | ICD-10-CM | POA: Diagnosis not present

## 2017-12-10 DIAGNOSIS — H02831 Dermatochalasis of right upper eyelid: Secondary | ICD-10-CM | POA: Diagnosis not present

## 2017-12-10 DIAGNOSIS — H35033 Hypertensive retinopathy, bilateral: Secondary | ICD-10-CM | POA: Diagnosis not present

## 2017-12-10 DIAGNOSIS — H02834 Dermatochalasis of left upper eyelid: Secondary | ICD-10-CM | POA: Diagnosis not present

## 2017-12-10 DIAGNOSIS — H0100A Unspecified blepharitis right eye, upper and lower eyelids: Secondary | ICD-10-CM | POA: Diagnosis not present

## 2017-12-10 NOTE — Telephone Encounter (Signed)
Patient calling the office for samples of medication:   1.  What medication and dosage are you requesting samples for? Xarelto  2.  Are you currently out of this medication?  2 days left

## 2017-12-10 NOTE — Telephone Encounter (Signed)
Patient aware samples are at the front desk for pick up  

## 2017-12-12 ENCOUNTER — Telehealth: Payer: Self-pay | Admitting: *Deleted

## 2017-12-12 NOTE — Telephone Encounter (Signed)
Received clinic note with summary from [place]; forwarded to provider/SLS 01/04

## 2017-12-17 DIAGNOSIS — L700 Acne vulgaris: Secondary | ICD-10-CM | POA: Diagnosis not present

## 2017-12-17 DIAGNOSIS — L570 Actinic keratosis: Secondary | ICD-10-CM | POA: Diagnosis not present

## 2017-12-22 ENCOUNTER — Other Ambulatory Visit (INDEPENDENT_AMBULATORY_CARE_PROVIDER_SITE_OTHER): Payer: Medicare Other

## 2017-12-22 ENCOUNTER — Encounter (INDEPENDENT_AMBULATORY_CARE_PROVIDER_SITE_OTHER): Payer: Self-pay | Admitting: Orthopaedic Surgery

## 2017-12-22 ENCOUNTER — Telehealth: Payer: Self-pay | Admitting: Cardiology

## 2017-12-22 ENCOUNTER — Ambulatory Visit (INDEPENDENT_AMBULATORY_CARE_PROVIDER_SITE_OTHER): Payer: Medicare Other | Admitting: Orthopaedic Surgery

## 2017-12-22 DIAGNOSIS — N289 Disorder of kidney and ureter, unspecified: Secondary | ICD-10-CM | POA: Diagnosis not present

## 2017-12-22 DIAGNOSIS — G5601 Carpal tunnel syndrome, right upper limb: Secondary | ICD-10-CM

## 2017-12-22 LAB — BASIC METABOLIC PANEL
BUN: 20 mg/dL (ref 6–23)
CHLORIDE: 103 meq/L (ref 96–112)
CO2: 30 meq/L (ref 19–32)
CREATININE: 1.4 mg/dL (ref 0.40–1.50)
Calcium: 9.2 mg/dL (ref 8.4–10.5)
GFR: 51.3 mL/min — ABNORMAL LOW (ref >60.00)
Glucose, Bld: 172 mg/dL — ABNORMAL HIGH (ref 70–99)
POTASSIUM: 4.3 meq/L (ref 3.5–5.1)
Sodium: 141 mEq/L (ref 135–145)

## 2017-12-22 MED ORDER — FUROSEMIDE 40 MG PO TABS: 40.0000 mg | ORAL_TABLET | Freq: Every day | ORAL | 0 refills | Status: DC

## 2017-12-22 NOTE — Progress Notes (Signed)
Patient ID: [name], male   DOB: June 10, 1931, 84 y.o.   MRN: [id number]  Patient is 6 weeks status post right carpal tunnel release.  He is doing well overall.  His symptoms have improved significantly.  His surgical scar is essentially fully healed.  He has a dry scab at the distal corner of the incision.  There is no evidence of infection or drainage or swelling.  Patient is overall doing well.  From my standpoint increase activity and use of his hand as tolerated.  I advised him that the scab will fall off on its own once the skin is fully healed.  Questions encouraged and answered.  Follow-up as needed.

## 2017-12-22 NOTE — Telephone Encounter (Signed)
°*  STAT* If patient is at the pharmacy, call can be transferred to refill team.   1. Which medications need to be refilled? (please list name of each medication and dose if known)  furosemide (LASIX) 20 MG tablet(Expired)    2. Which pharmacy/location (including street and city if local pharmacy) is medication to be sent to? [facility] in [place]. Do they need a 30 day or 90 day supply?  [place]

## 2017-12-22 NOTE — Telephone Encounter (Signed)
Patient aware needs appointment and medication e-sent to pharmacy

## 2017-12-29 ENCOUNTER — Emergency Department
Admission: EM | Admit: 2017-12-29 | Discharge: 2017-12-29 | Disposition: A | Discharge status: Home / Self Care | Payer: Medicare Other | Source: Home / Self Care | Attending: Family Medicine | Admitting: Family Medicine

## 2017-12-29 ENCOUNTER — Emergency Department (INDEPENDENT_AMBULATORY_CARE_PROVIDER_SITE_OTHER): Payer: Medicare Other

## 2017-12-29 ENCOUNTER — Encounter: Payer: Self-pay | Admitting: Emergency Medicine

## 2017-12-29 DIAGNOSIS — R05 Cough: Secondary | ICD-10-CM

## 2017-12-29 DIAGNOSIS — R053 Chronic cough: Secondary | ICD-10-CM

## 2017-12-29 MED ORDER — DOXYCYCLINE HYCLATE 100 MG PO CAPS: 100.0000 mg | ORAL_CAPSULE | Freq: Two times a day (BID) | ORAL | 0 refills | Status: DC

## 2017-12-29 NOTE — Discharge Instructions (Signed)
Take plain guaifenesin (1200mg  extended release tabs such as Mucinex) twice daily, with plenty of water, for cough and congestion.  Also recommend using saline nasal spray several times daily or saline nasal irrigation (AYR is a common brand).   May continue Delsym Cough Suppressant at bedtime for nighttime cough.  Stop all antihistamines for now (including Tylenol PM) and other non-prescription cough/cold preparations.

## 2017-12-29 NOTE — ED Triage Notes (Signed)
Pt c/o cough with mucous and chest congestion x2 weeks. States he is taking keflex for infection on arm from his dermatologist.

## 2017-12-29 NOTE — ED Provider Notes (Signed)
[name]    CSN: [id number] Arrival date & time: 12/29/17  1119     History   Chief Complaint Chief Complaint  Patient presents with  . Cough    HPI [name] is a 84 y.o. male.   Patient states that he developed a flu-like illness about 2 weeks ago without sore throat.  He has improved but still has a cough, worse at night.  He has developed occasional wheezing, but denies fevers, chills, and sweats, and no pleuritic pain.  He is presently taking Keflex 500mg  TID, prescribed by his dermatologist for a skin infection.   The history is provided by the patient.    Past Medical History:  Diagnosis Date  . Anemia   . Antral gastritis 2013   EGD   . Atrial fibrillation ([place])   . CAD (coronary artery disease)    s/p CABG 1993 (L-LAD, S-RI, S-D1);   echo 1/10: EF 55%;    myoview 12/09: inf MI, no ischemia  . Candida esophagitis ([place]) 2013   EGD   . Cataracts, bilateral   . Family history of malignant neoplasm of gastrointestinal tract   . Folliculitis   . Hiatal hernia   . HTN (hypertension)   . Hyperlipidemia   . Irritable bladder   . Myocardial infarction ([place])   . Obesity   . Osteoarthritis     Patient Active Problem List   Diagnosis Date Noted  . CHF (congestive heart failure) ([place]) 12/17/2016  . Arthritis of left ankle 10/15/2016  . PCP NOTES >>>>> 10/11/2015  . Internal hemorrhoid 06/01/2014  . Carpal tunnel syndrome 02/15/2014  . Anemia 08/14/2012  . Annual physical exam 01/08/2012  . Atrial fibrillation ([place]) 03/29/2011  . BRADYCARDIA 06/19/2010  . Venous (peripheral) insufficiency 09/04/2009  . PALPITATIONS 07/07/2009  . OBESITY 07/04/2009  . Lower urinary tract symptoms (LUTS) 07/04/2009  . DJD (degenerative joint disease) 07/04/2009  . SLEEP DISORDER 07/04/2009  . Dyspepsia 02/28/2009  . HEMATOCHEZIA 02/07/2009  . DM II (diabetes mellitus, type II), controlled ([place]) 04/25/2008  . Hyperlipidemia 06/25/2007  . Essential  hypertension 06/25/2007  . CORONARY ARTERY DISEASE 06/25/2007    Past Surgical History:  Procedure Laterality Date  . CHOLECYSTECTOMY     laparoscopic '92  . CORONARY ARTERY BYPASS GRAFT     graft '92: LIMA-LAD, SVG - D2,R1, D1  . ENTEROSCOPY  08/22/2012   Procedure: ENTEROSCOPY;  Surgeon: Juanita Craver, MD;  Location: [facility];  Service: Endoscopy;  Laterality: N/A;  . KNEE SURGERY    . MOLE REMOVAL  2001 and 2008  . PILONIDAL CYST EXCISION         Home Medications    Prior to Admission medications   Medication Sig Start Date End Date Taking? Authorizing Provider  atorvastatin (LIPITOR) 80 MG tablet Take 1 tablet (80 mg total) by mouth at bedtime. 08/07/17   Lelon Perla, MD  benazepril (LOTENSIN) 40 MG tablet Take 1 tablet (40 mg total) daily by mouth. 10/20/17   Colon Branch, MD  Cholecalciferol (VITAMIN D) 1000 UNITS capsule Take 1,000 Units by mouth daily.      [provider]  Coenzyme Q10 (COQ10 PO) Take 1 capsule by mouth daily.    [provider]  cyclobenzaprine (FLEXERIL) 5 MG tablet Take 1 at bedtime as needed for muscle relaxant. Caution: May cause drowsiness. 06/16/14   Jacqulyn Cane, MD  diclofenac sodium (VOLTAREN) 1 % GEL Apply 4 g topically 4 (four) times daily. To  affected joint. 10/15/16   Gregor Hams, MD  diphenhydramine-acetaminophen (TYLENOL PM) 25-500 MG TABS tablet Take 2 tablets by mouth at bedtime.    [provider]  doxycycline (VIBRAMYCIN) 100 MG capsule Take 1 capsule (100 mg total) by mouth 2 (two) times daily. Take with food. 12/29/17   Kandra Nicolas, MD  furosemide (LASIX) 40 MG tablet Take 1 tablet (40 mg total) by mouth daily. Keep appointment 12/22/17 03/22/18  Lelon Perla, MD  Melatonin 5 MG TABS Take 5 mg by mouth at bedtime.    [provider]  metoprolol succinate (TOPROL-XL) 25 MG 24 hr tablet Take 1 tablet (25 mg total) by mouth daily. 09/05/17   Colon Branch, MD  Multiple Vitamin (MULTIVITAMIN  WITH MINERALS) TABS tablet Take 1 tablet by mouth daily.    [provider]  nitroGLYCERIN (NITROSTAT) 0.4 MG SL tablet Place 1 tablet (0.4 mg total) under the tongue every 5 (five) minutes as needed for chest pain. 01/04/15   Lelon Perla, MD  pantoprazole (PROTONIX) 40 MG tablet Take 1 tablet (40 mg total) by mouth daily. 10/07/17   Colon Branch, MD  Probiotic Product (PROBIOTIC DAILY PO) Take 1 tablet by mouth daily.    [provider]  tamsulosin (FLOMAX) 0.4 MG CAPS capsule Take 1 capsule (0.4 mg total) daily by mouth. 10/13/17   Colon Branch, MD  vitamin B-12 (CYANOCOBALAMIN) 1000 MCG tablet Take 1,000 mcg by mouth daily.    [provider]  XARELTO 20 MG TABS tablet TAKE 1 TABLET BY MOUTH  DAILY WITH SUPPER 12/10/17   Lelon Perla, MD    Family History Family History  Problem Relation Age of Onset  . Coronary artery disease Father        and brother  . Heart attack Father        and brother-fatal  . Stroke Father   . Breast cancer Mother   . Alzheimer's disease Mother   . Colon cancer Maternal Uncle   . Esophageal cancer Neg Hx   . Stomach cancer Neg Hx   . Rectal cancer Neg Hx   . Prostate cancer Neg Hx     Social History Social History   Tobacco Use  . Smoking status: Former Smoker    Years: 16.00    Last attempt to quit: 08/14/1964    Years since quitting: 53.4  . Smokeless tobacco: Never Used  . Tobacco comment: quit 1965  Substance Use Topics  . Alcohol use: No  . Drug use: No     Allergies   Patient has no known allergies.   Review of Systems Review of Systems No sore throat + cough No pleuritic pain + wheezing + nasal congestion ? post-nasal drainage No sinus pain/pressure No itchy/red eyes No earache No hemoptysis No SOB No fever/chills No nausea No vomiting No abdominal pain No diarrhea No urinary symptoms No skin rash + fatigue No myalgias No headache Used OTC meds without relief   Physical  Exam Triage Vital Signs ED Triage Vitals [12/29/17 1200]  Enc Vitals Group     BP 108/68     Pulse Rate 70     Resp      Temp 97.6 F (36.4 C)     Temp Source Oral     SpO2 95 %     Weight 229 lb (103.9 kg)     Height      Head Circumference  Peak Flow      Pain Score      Pain Loc      Pain Edu?      Excl. in [place]?    No data found.  Updated Vital Signs BP 108/68 (BP Location: Right Arm)   Pulse 70   Temp 97.6 F (36.4 C) (Oral)   Wt 229 lb (103.9 kg)   SpO2 95%   BMI 32.86 kg/m   Visual Acuity Right Eye Distance:   Left Eye Distance:   Bilateral Distance:    Right Eye Near:   Left Eye Near:    Bilateral Near:     Physical Exam Nursing notes and Vital Signs reviewed. Appearance:  Patient appears stated age, and in no acute distress Eyes:  Pupils are equal, round, and reactive to light and accomodation.  Extraocular movement is intact.  Conjunctivae are not inflamed  Ears:  Canals normal.  Tympanic membranes normal.  Nose:  Mildly congested turbinates.  No sinus tenderness. Neck:  Supple.  Enlarged posterior/lateral nodes are palpated bilaterally, tender to palpation on the left.   Lungs:  Clear to auscultation.  Breath sounds are equal.  Moving air well. Heart:  Regular rate and rhythm without murmurs, rubs, or gallops.  Abdomen:  Nontender without masses or hepatosplenomegaly.  Bowel sounds are present.  No CVA or flank tenderness.  Extremities:  No edema.  Skin:  No rash present.    UC Treatments / Results  Labs (all labs ordered are listed, but only abnormal results are displayed) Labs Reviewed - No data to display  EKG  EKG Interpretation None       Radiology Dg Chest 2 View  Result Date: 12/29/2017 CLINICAL DATA:  Two weeks of flu like symptoms, persistent cough. Nonsmoker. EXAM: CHEST  2 VIEW COMPARISON:  Portable chest x-ray of November 16, 2016 FINDINGS: The lungs are adequately inflated. There is no focal infiltrate. There is no pleural  effusion. The heart and pulmonary vascularity are normal. The mediastinum is normal in width. The sternal wires are intact. There is calcification in the wall of the aortic arch. The bony thorax exhibits no acute abnormality. IMPRESSION: There is no active cardiopulmonary disease. Electronically Signed   By: David  Martinique M.D.   On: 12/29/2017 13:30    Procedures Procedures (including critical care time)  Medications Ordered in UC Medications - No data to display   Initial Impression / Assessment and Plan / UC Course  I have reviewed the triage vital signs and the nursing notes.  Pertinent labs & imaging results that were available during my care of the patient were reviewed by me and considered in my medical decision making (see chart for details).    Begin empiric doxycycline 100mg  BID for one week. Take plain guaifenesin (1200mg  extended release tabs such as Mucinex) twice daily, with plenty of water, for cough and congestion.  Also recommend using saline nasal spray several times daily or saline nasal irrigation (AYR is a common brand).   May continue Delsym Cough Suppressant at bedtime for nighttime cough.  Stop all antihistamines for now (including Tylenol PM) and other non-prescription cough/cold preparations. Followup with Family Doctor if not improved in one week.     Final Clinical Impressions(s) / UC Diagnoses   Final diagnoses:  Persistent cough    ED Discharge Orders        Ordered    doxycycline (VIBRAMYCIN) 100 MG capsule  2 times daily     12/29/17 1403  Kandra Nicolas, MD 01/03/18 [phone]

## 2017-12-30 ENCOUNTER — Ambulatory Visit (HOSPITAL_BASED_OUTPATIENT_CLINIC_OR_DEPARTMENT_OTHER)
Admission: RE | Admit: 2017-12-30 | Discharge: 2017-12-30 | Disposition: A | Discharge status: Home / Self Care | Payer: Medicare Other | Source: Ambulatory Visit | Attending: Internal Medicine | Admitting: Internal Medicine

## 2017-12-30 ENCOUNTER — Ambulatory Visit (INDEPENDENT_AMBULATORY_CARE_PROVIDER_SITE_OTHER): Payer: Medicare Other | Admitting: Internal Medicine

## 2017-12-30 ENCOUNTER — Ambulatory Visit: Payer: Self-pay

## 2017-12-30 ENCOUNTER — Encounter: Payer: Self-pay | Admitting: Internal Medicine

## 2017-12-30 VITALS — BP 126/78 | HR 76 | Temp 97.6°F | Resp 14 | Ht 70.0 in | Wt 230.1 lb

## 2017-12-30 DIAGNOSIS — Z7901 Long term (current) use of anticoagulants: Secondary | ICD-10-CM | POA: Diagnosis not present

## 2017-12-30 DIAGNOSIS — W19XXXA Unspecified fall, initial encounter: Secondary | ICD-10-CM | POA: Insufficient documentation

## 2017-12-30 DIAGNOSIS — S0990XA Unspecified injury of head, initial encounter: Secondary | ICD-10-CM

## 2017-12-30 DIAGNOSIS — S41111A Laceration without foreign body of right upper arm, initial encounter: Secondary | ICD-10-CM | POA: Diagnosis not present

## 2017-12-30 NOTE — Progress Notes (Signed)
Subjective:    Patient ID: [name], male    DOB: 1931/02/26, 84 y.o.   MRN: [id number]  DOS:  12/30/2017 Type of visit - description : acute, here with his best friend Shirlee Limerick Interval history:  The patient went to bed at 11 pm last night,  At 1 AM today he was in the floor by his bed , he had a bruise on the forehead and  right arm.  He is not clear as of what happened, thinks he remember falling and after that he was completely conscious, non-postictal, denies any bladder or bowel incontinence. Shirlee Limerick who is here, talked to him earlier in the morning, he was not as "sharp" (mentaly)  as usual but he was not completely confused either.  He is now at baseline.  He also, mild discomfort at the neck, upper back, more by turning his head.   Review of Systems  Denies upper or lower extremity paresthesias, no leg incoordination No chest pain, palpitations. Went to a urgent care yesterday with a URI, prescribed doxycycline, states that overall he feels well, states that he has been keeping himself hydrated.  Past Medical History:  Diagnosis Date  . Anemia   . Antral gastritis 2013   EGD   . Atrial fibrillation ([place])   . CAD (coronary artery disease)    s/p CABG 1993 (L-LAD, S-RI, S-D1);   echo 1/10: EF 55%;    myoview 12/09: inf MI, no ischemia  . Candida esophagitis ([place]) 2013   EGD   . Cataracts, bilateral   . Family history of malignant neoplasm of gastrointestinal tract   . Folliculitis   . Hiatal hernia   . HTN (hypertension)   . Hyperlipidemia   . Irritable bladder   . Myocardial infarction ([place])   . Obesity   . Osteoarthritis     Past Surgical History:  Procedure Laterality Date  . CHOLECYSTECTOMY     laparoscopic '92  . CORONARY ARTERY BYPASS GRAFT     graft '92: LIMA-LAD, SVG - D2,R1, D1  . ENTEROSCOPY  08/22/2012   Procedure: ENTEROSCOPY;  Surgeon: Juanita Craver, MD;  Location: [facility];  Service: Endoscopy;  Laterality: N/A;  . KNEE SURGERY    . MOLE REMOVAL   2001 and 2008  . PILONIDAL CYST EXCISION      Social History   Socioeconomic History  . Marital status: Widowed    Spouse name: Not on file  . Number of children: 1  . Years of education: Not on file  . Highest education level: Not on file  Social Needs  . Financial resource strain: Not on file  . Food insecurity - worry: Not on file  . Food insecurity - inability: Not on file  . Transportation needs - medical: Not on file  . Transportation needs - non-medical: Not on file  Occupational History  . Occupation: Retired    Fish farm manager: RETIRED  Tobacco Use  . Smoking status: Former Smoker    Years: 16.00    Last attempt to quit: 08/14/1964    Years since quitting: 53.4  . Smokeless tobacco: Never Used  . Tobacco comment: quit 1965  Substance and Sexual Activity  . Alcohol use: No  . Drug use: No  . Sexual activity: Not on file  Other Topics Concern  . 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 Married '57- widowed 03/10/23. 1 son -'6- died '22 MVA.  Lives by himself   No immediate family. Emergency contact : Daron Offer [phone] (friend)      Allergies as of 12/30/2017   No Known Allergies     Medication List        Accurate as of 12/30/17 11:59 PM. Always use your most recent med list.          atorvastatin 80 MG tablet Commonly known as:  LIPITOR Take 1 tablet (80 mg total) by mouth at bedtime.   benazepril 40 MG tablet Commonly known as:  LOTENSIN Take 1 tablet (40 mg total) daily by mouth.   COQ10 PO Take 1 capsule by mouth daily.   cyclobenzaprine 5 MG tablet Commonly known as:  FLEXERIL Take 1 at bedtime as needed for muscle relaxant. Caution: May cause drowsiness.   diclofenac sodium 1 % Gel Commonly known as:  VOLTAREN Apply 4 g topically 4 (four) times daily. To affected joint.   diphenhydramine-acetaminophen 25-500 MG Tabs tablet Commonly known as:  TYLENOL PM Take 2 tablets by mouth at bedtime.     doxycycline 100 MG capsule Commonly known as:  VIBRAMYCIN Take 1 capsule (100 mg total) by mouth 2 (two) times daily. Take with food.   furosemide 40 MG tablet Commonly known as:  LASIX Take 1 tablet (40 mg total) by mouth daily. Keep appointment   Melatonin 5 MG Tabs Take 5 mg by mouth at bedtime.   metoprolol succinate 25 MG 24 hr tablet Commonly known as:  TOPROL-XL Take 1 tablet (25 mg total) by mouth daily.   multivitamin with minerals Tabs tablet Take 1 tablet by mouth daily.   nitroGLYCERIN 0.4 MG SL tablet Commonly known as:  NITROSTAT Place 1 tablet (0.4 mg total) under the tongue every 5 (five) minutes as needed for chest pain.   pantoprazole 40 MG tablet Commonly known as:  PROTONIX Take 1 tablet (40 mg total) by mouth daily.   PROBIOTIC DAILY PO Take 1 tablet by mouth daily.   tamsulosin 0.4 MG Caps capsule Commonly known as:  FLOMAX Take 1 capsule (0.4 mg total) daily by mouth.   vitamin B-12 1000 MCG tablet Commonly known as:  CYANOCOBALAMIN Take 1,000 mcg by mouth daily.   Vitamin D 1000 units capsule Take 1,000 Units by mouth daily.   XARELTO 20 MG Tabs tablet Generic drug:  rivaroxaban TAKE 1 TABLET BY MOUTH  DAILY WITH SUPPER          Objective:   Physical Exam BP 126/78 (BP Location: Right Arm, Patient Position: Sitting, Cuff Size: Normal)   Pulse 76   Temp 97.6 F (36.4 C) (Oral)   Resp 14   Ht 5\' 10"  (1.778 m)   Wt 230 lb 2 oz (104.4 kg)   SpO2 95%   BMI 33.02 kg/m  General:   Well developed, well nourished . NAD.  HEENT:  Normocephalic . Face symmetric, atraumatic Neck: No TTP in the cervical and proximal thoracic spine.  Range of motion is normal, did report neck pain when he turns his head to the left or right.   Lungs:  CTA B Normal respiratory effort, no intercostal retractions, no accessory muscle use. Heart: irreg, No pretibial edema bilaterally  Skin: See pictures, had a scratch at the right arm and  forehead. Neurologic:  alert & oriented X3.  Speech normal, gait appropriate for age and unassisted.  Motor and DTR symmetric. Psych--  Cognition and judgment appear intact.  Cooperative with normal attention span and concentration.  Behavior appropriate. No anxious or depressed appearing.          Assessment & Plan:    Assessment  DM --diet control, no neuropathy HTN Hyperlipidemia CV: --CAD, CABG 1993, Myoview 2009 no ischemia. --Atrial fibrillation -- rate control, xarelto -- chronic combined systolic/diastolic congestive heart failure Venous insufficiency, mild LE edema L>>R  LUTS:  DRE-PSA wnl 07-2016 MSK: DJD, R wrist Fx in the 80s GI: --Candida esophagitis, antral gastritis --->  2013 per EGD --HH  PLAN: Fall: Patient had a fall, mechanism unclear, he feels that he simply was sleeping and fell off the bed.  He had no chest pain or palpitations.  No seizure on clinical grounds. He was a little confused after the incident. EKG today A. fib, unchanged from previous. Plan:  [place] head Fall and head injury precautions d/w pt  Closed head injury: As above Wound care: Rec to keep the areas clean, dry and covered.  Okay antibiotic ointments. Call if any presyncope feeling, ER if syncope Arm wound R arm re-dressed

## 2017-12-30 NOTE — Patient Instructions (Signed)
Get your CT down  stairs  Keep the wounds clean, okay to put an antibiotic ointment and then cover them for a few days  Call if redness or swelling or discharge  ER if severe headache, nausea, vomiting, confusion    Concussion, Adult A concussion is a brain injury from a direct hit (blow) to the head or body. This injury causes the brain to shake quickly back and forth inside the skull. It is caused by:  A hit to the head.  A quick and sudden movement (jolt) of the head or neck.  How fast you will get better from a concussion depends on many things like how bad your concussion was, what part of your brain was hurt, how old you are, and how healthy you were before the concussion. Recovery can take time. It is important to wait to return to activity until a doctor says it is safe and your symptoms are all gone. Follow these instructions at home: Activity  Limit activities that need a lot of thought or concentration. These include: ? Homework or work for your job. ? Watching TV. ? Computer work. ? Playing memory games and puzzles.  Rest. Rest helps the brain to heal. Make sure you: ? Get plenty of sleep at night. Do not stay up late. ? Go to bed at the same time every day. ? Rest during the day. Take naps or rest breaks when you feel tired.  It can be dangerous if you get another concussion before the first one has healed Do not do activities that could cause a second concussion, such as riding a bike or playing sports.  Ask your doctor when you can return to your normal activities, like driving, riding a bike, or using machinery. Your ability to react may be slower. Do not do these activities if you are dizzy. Your doctor will likely give you a plan for slowly going back to activities. General instructions  Take over-the-counter and prescription medicines only as told by your doctor.  Do not drink alcohol until your doctor says you can.  If it is harder than usual to remember  things, write them down.  If you are easily distracted, try to do one thing at a time. For example, do not try to watch TV while making dinner.  Talk with family members or close friends when you need to make important decisions.  Watch your symptoms and tell other people to do the same. Other problems (complications) can happen after a concussion. Older adults with a brain injury may have a higher risk of serious problems, such as a blood clot in the brain.  Tell your teachers, school nurse, school counselor, coach, Product/process development scientist, or work Freight forwarder about your injury and symptoms. Tell them about what you can or cannot do. They should watch for: ? More problems with attention or concentration. ? More trouble remembering or learning new information. ? More time needed to do tasks or assignments. ? Being more annoyed (irritable) or having a harder time dealing with stress. ? Any other symptoms that get worse.  Keep all follow-up visits as told by your health care provider. This is important. Prevention  It is very important that you donot get another brain injury, especially before you have healed. In rare cases, another injury can cause permanent brain damage, brain swelling, or death. You have the most risk if you get another head injury in the first 7-10 days after you were hurt before. To avoid injuries: ?  Wear a seat belt when you ride in a car. ? Do not drink too much alcohol. ? Avoid activities that could make you get a second concussion, like contact sports. ? Wear a helmet when you do activities like:  Biking.  Skiing.  Skateboarding.  Skating. ? Make your home safe by:  Removing things from the floor or stairs that could make you trip.  Using grab bars in bathrooms and handrails by stairs.  Placing non-slip mats on floors and in bathtubs.  Putting more light in dark areas. Contact a doctor if:  Your symptoms get worse.  You have new symptoms.  You keep having  symptoms for more than 2 weeks. Get help right away if:  You have bad headaches, or your headaches get worse.  You have weakness in any part of your body.  You have loss of feeling (numbness).  You feel off balance.  You keep throwing up (vomiting).  You feel more sleepy.  The black center of one eye (pupil) is bigger than the other one.  You twitch or shake violently (convulse) or have a seizure.  Your speech is not clear (is slurred).  You feel more tired, more confused, or more annoyed.  You do not recognize people or places.  You have neck pain.  It is hard to wake you up.  You have strange behavior changes.  You pass out (lose consciousness). Summary  A concussion is a brain injury from a direct hit (blow) to the head or body.  This condition is treated with rest and careful watching of symptoms.  If you keep having symptoms for more than 2 weeks, call your doctor. This information is not intended to replace advice given to you by your health care provider. Make sure you discuss any questions you have with your health care provider. Document Released: 11/13/2009 Document Revised: 11/09/2016 Document Reviewed: 11/09/2016 Elsevier Interactive Patient Education  2017 [employer] Prevention in the Home Falls can cause injuries and can affect people from all age groups. There are many simple things that you can do to make your home safe and to help prevent falls. What can I do on the outside of my home?  Regularly repair the edges of walkways and driveways and fix any cracks.  Remove high doorway thresholds.  Trim any shrubbery on the main path into your home.  Use bright outdoor lighting.  Clear walkways of debris and clutter, including tools and rocks.  Regularly check that handrails are securely fastened and in good repair. Both sides of any steps should have handrails.  Install guardrails along the edges of any raised decks or porches.  Have  leaves, snow, and ice cleared regularly.  Use sand or salt on walkways during winter months.  In the garage, clean up any spills right away, including grease or oil spills. What can I do in the bathroom?  Use night lights.  Install grab bars by the toilet and in the tub and shower. Do not use towel bars as grab bars.  Use non-skid mats or decals on the floor of the tub or shower.  If you need to sit down while you are in the shower, use a plastic, non-slip stool.  Keep the floor dry. Immediately clean up any water that spills on the floor.  Remove soap buildup in the tub or shower on a regular basis.  Attach bath mats securely with double-sided non-slip rug tape.  Remove throw rugs and other tripping  hazards from the floor. What can I do in the bedroom?  Use night lights.  Make sure that a bedside light is easy to reach.  Do not use oversized bedding that drapes onto the floor.  Have a firm chair that has side arms to use for getting dressed.  Remove throw rugs and other tripping hazards from the floor. What can I do in the kitchen?  Clean up any spills right away.  Avoid walking on wet floors.  Place frequently used items in easy-to-reach places.  If you need to reach for something above you, use a sturdy step stool that has a grab bar.  Keep electrical cables out of the way.  Do not use floor polish or wax that makes floors slippery. If you have to use wax, make sure that it is non-skid floor wax.  Remove throw rugs and other tripping hazards from the floor. What can I do in the stairways?  Do not leave any items on the stairs.  Make sure that there are handrails on both sides of the stairs. Fix handrails that are broken or loose. Make sure that handrails are as long as the stairways.  Check any carpeting to make sure that it is firmly attached to the stairs. Fix any carpet that is loose or worn.  Avoid having throw rugs at the top or bottom of stairways, or  secure the rugs with carpet tape to prevent them from moving.  Make sure that you have a light switch at the top of the stairs and the bottom of the stairs. If you do not have them, have them installed. What are some other fall prevention tips?  Wear closed-toe shoes that fit well and support your feet. Wear shoes that have rubber soles or low heels.  When you use a stepladder, make sure that it is completely opened and that the sides are firmly locked. Have someone hold the ladder while you are using it. Do not climb a closed stepladder.  Add color or contrast paint or tape to grab bars and handrails in your home. Place contrasting color strips on the first and last steps.  Use mobility aids as needed, such as canes, walkers, scooters, and crutches.  Turn on lights if it is dark. Replace any light bulbs that burn out.  Set up furniture so that there are clear paths. Keep the furniture in the same spot.  Fix any uneven floor surfaces.  Choose a carpet design that does not hide the edge of steps of a stairway.  Be aware of any and all pets.  Review your medicines with your healthcare provider. Some medicines can cause dizziness or changes in blood pressure, which increase your risk of falling. Talk with your health care provider about other ways that you can decrease your risk of falls. This may include working with a physical therapist or trainer to improve your strength, balance, and endurance. This information is not intended to replace advice given to you by your health care provider. Make sure you discuss any questions you have with your health care provider. Document Released: 11/15/2002 Document Revised: 04/23/2016 Document Reviewed: 12/30/2014 Elsevier Interactive Patient Education  [employer].

## 2017-12-30 NOTE — Progress Notes (Signed)
Pre visit review using our clinic review tool, if applicable. No additional management support is needed unless otherwise documented below in the visit note. 

## 2017-12-30 NOTE — Telephone Encounter (Signed)
FYI. Pt has appt later today.  

## 2017-12-30 NOTE — Telephone Encounter (Signed)
   Reason for Disposition . [1] Last tetanus shot > 5 years ago AND [2] DIRTY cut or scrape  Answer Assessment - Initial Assessment Questions 1. MECHANISM: "How did the injury happen?" For falls, ask: "What height did you fall from?" and "What surface did you fall against?"      Fell out of bed last night 2. ONSET: "When did the injury happen?" (Minutes or hours ago)      Last night 3. NEUROLOGIC SYMPTOMS: "Was there any loss of consciousness?" "Are there any other neurological symptoms?"      No 4. MENTAL STATUS: "Does the person know who he is, who you are, and where he is?"      Alert 5. LOCATION: "What part of the head was hit?"      Forehead scrape 6. SCALP APPEARANCE: "What does the scalp look like? Is it bleeding now?" If so, ask: "Is it difficult to stop?"      Forehead scrape 7. SIZE: For cuts, bruises, or swelling, ask: "How large is it?" (e.g., inches or centimeters)      1 inch 8. PAIN: "Is there any pain?" If so, ask: "How bad is it?"  (e.g., Scale 1-10; or mild, moderate, severe)     Neck pain  9. TETANUS: For any breaks in the skin, ask: "When was the last tetanus booster?"     Unknown 10. OTHER SYMPTOMS: "Do you have any other symptoms?" (e.g., neck pain, vomiting)       Neck pain 11. PREGNANCY: "Is there any chance you are pregnant?" "When was your last menstrual period?"       No  Protocols used: HEAD INJURY-A-AH  No LOC or swelling to forehead. Will apply ice pack. Appointment made for today.

## 2017-12-31 NOTE — Assessment & Plan Note (Addendum)
Fall: Patient had a fall, mechanism unclear, he feels that he simply was sleeping and fell off the bed.  He had no chest pain or palpitations.  No seizure on clinical grounds. He was a little confused after the incident. EKG today A. fib, unchanged from previous. Plan:  [place] head Fall and head injury precautions d/w pt  Closed head injury: As above Wound care: Rec to keep the areas clean, dry and covered.  Okay antibiotic ointments. Call if any presyncope feeling, ER if syncope Arm wound R arm re-dressed

## 2018-01-07 ENCOUNTER — Other Ambulatory Visit: Payer: Self-pay | Admitting: Internal Medicine

## 2018-01-13 NOTE — Progress Notes (Signed)
HPI: FU coronary artery disease and atrial fibrillation. Patient is status post coronary artery bypassing graft in 1993 with a LIMA to the LAD, saphenous vein graft to the ramus intermedius and saphenous vein graft to the first diagonal. CT of the abdomen in January of 2009 showed no renal artery stenosis. There was no aortic aneurysm. Nuclear study in June 2014 showed scar in the mid/apical inferior wall and apex. There was a small area of ischemia in the base to mid anterior wall. The study was unchanged compared to May of 2012. The study was not gated. We have treated medically. Diuretics added recently for increased edema. Echo 12/17 showed EF 40-45, mild MR, severe LAE, moderate RAE, mild RVE, mild TR. Since last seen, there is no dyspnea, chest pain, palpitations, syncope or bleeding.   Current Outpatient Medications  Medication Sig Dispense Refill  . atorvastatin (LIPITOR) 80 MG tablet Take 1 tablet (80 mg total) by mouth at bedtime. 90 tablet 0  . benazepril (LOTENSIN) 40 MG tablet Take 1 tablet (40 mg total) by mouth daily. 90 tablet 1  . benzonatate (TESSALON) 100 MG capsule Take 1-2 capsules (100-200 mg total) by mouth every 8 (eight) hours. 21 capsule 0  . Cholecalciferol (VITAMIN D) 1000 UNITS capsule Take 1,000 Units by mouth daily.      . Coenzyme Q10 (COQ10 PO) Take 1 capsule by mouth daily.    . diphenhydramine-acetaminophen (TYLENOL PM) 25-500 MG TABS tablet Take 2 tablets by mouth at bedtime.    . furosemide (LASIX) 40 MG tablet Take 1 tablet (40 mg total) by mouth daily. Keep appointment 90 tablet 0  . Melatonin 5 MG TABS Take 5 mg by mouth at bedtime.    . metoprolol succinate (TOPROL-XL) 25 MG 24 hr tablet Take 1 tablet (25 mg total) by mouth daily. 90 tablet 0  . Multiple Vitamin (MULTIVITAMIN WITH MINERALS) TABS tablet Take 1 tablet by mouth daily.    . nitroGLYCERIN (NITROSTAT) 0.4 MG SL tablet Place 1 tablet (0.4 mg total) under the tongue every 5 (five) minutes as  needed for chest pain. 25 tablet 3  . pantoprazole (PROTONIX) 40 MG tablet Take 1 tablet (40 mg total) by mouth daily. 90 tablet 1  . vitamin B-12 (CYANOCOBALAMIN) 1000 MCG tablet Take 1,000 mcg by mouth daily.    Alveda Reasons 20 MG TABS tablet TAKE 1 TABLET BY MOUTH  DAILY WITH SUPPER 90 tablet 1   No current facility-administered medications for this visit.      Past Medical History:  Diagnosis Date  . Anemia   . Antral gastritis 2013   EGD   . Atrial fibrillation ([facility])   . CAD (coronary artery disease)    s/p CABG 1993 (L-LAD, S-RI, S-D1);   echo 1/10: EF 55%;    myoview 12/09: inf MI, no ischemia  . Candida esophagitis ([place]) 2013   EGD   . Cataracts, bilateral   . Family history of malignant neoplasm of gastrointestinal tract   . Folliculitis   . Hiatal hernia   . HTN (hypertension)   . Hyperlipidemia   . Irritable bladder   . Myocardial infarction ([place])   . Obesity   . Osteoarthritis     Past Surgical History:  Procedure Laterality Date  . CHOLECYSTECTOMY     laparoscopic '92  . CORONARY ARTERY BYPASS GRAFT     graft '92: LIMA-LAD, SVG - D2,R1, D1  . ENTEROSCOPY  08/22/2012   Procedure: ENTEROSCOPY;  Surgeon:  Juanita Craver, MD;  Location: [facility];  Service: Endoscopy;  Laterality: N/A;  . KNEE SURGERY    . MOLE REMOVAL  2001 and 2008  . PILONIDAL CYST EXCISION      Social History   Socioeconomic History  . Marital status: Widowed    Spouse name: Not on file  . Number of children: 1  . Years of education: Not on file  . Highest education level: Not on file  Social Needs  . Financial resource strain: Not on file  . Food insecurity - worry: Not on file  . Food insecurity - inability: Not on file  . Transportation needs - medical: Not on file  . Transportation needs - non-medical: Not on file  Occupational History  . Occupation: Retired    Fish farm manager: RETIRED  Tobacco Use  . Smoking status: Former Smoker    Years: 16.00    Last attempt to quit: 08/14/1964      Years since quitting: 53.4  . Smokeless tobacco: Never Used  . Tobacco comment: quit 1965  Substance and Sexual Activity  . Alcohol use: No  . Drug use: No  . Sexual activity: Not on file  Other Topics Concern  . 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 Married '57- widowed February 15, 2023. 1 son -'59- died '26 MVA.    Lives by himself   No immediate family. Emergency contact : Daron Offer [phone] (friend)    Family History  Problem Relation Age of Onset  . Coronary artery disease Father        and brother  . Heart attack Father        and brother-fatal  . Stroke Father   . Breast cancer Mother   . Alzheimer's disease Mother   . Colon cancer Maternal Uncle   . Esophageal cancer Neg Hx   . Stomach cancer Neg Hx   . Rectal cancer Neg Hx   . Prostate cancer Neg Hx     ROS: back pain but no fevers or chills, productive cough, hemoptysis, dysphasia, odynophagia, melena, hematochezia, dysuria, hematuria, rash, seizure activity, orthopnea, PND, pedal edema, claudication. Remaining systems are negative.  Physical Exam: Well-developed well-nourished in no acute distress.  Skin is warm and dry.  HEENT is normal.  Neck is supple.  Chest is clear to auscultation with normal expansion.  Cardiovascular exam is irregular Abdominal exam nontender or distended. No masses palpated. Extremities show trace edema. neuro grossly intact  A/P  1 permanent atrial fibrillation-rate is controlled today.  Continue Toprol.  Continue Xarelto. Check hgb.  2 chronic combined systolic/diastolic congestive heart failure-patient appears to be euvolemic on examination.  Continue present dose of Lasix.  Take an additional 20 mg daily for weight gain of 2-3 pounds.  We discussed the importance of fluid restriction and low-sodium diet.  3 coronary artery disease-continue statin.  Not on aspirin given need for anticoagulation.  4 Hypertension-blood pressure is  controlled.  Continue present medications.  5 hyperlipidemia-continue statin.  Kirk Ruths, MD

## 2018-01-16 ENCOUNTER — Emergency Department
Admission: EM | Admit: 2018-01-16 | Discharge: 2018-01-16 | Disposition: A | Discharge status: Home / Self Care | Payer: Medicare Other | Source: Home / Self Care | Attending: Family Medicine | Admitting: Family Medicine

## 2018-01-16 ENCOUNTER — Other Ambulatory Visit: Payer: Self-pay

## 2018-01-16 ENCOUNTER — Emergency Department (INDEPENDENT_AMBULATORY_CARE_PROVIDER_SITE_OTHER): Payer: Medicare Other

## 2018-01-16 ENCOUNTER — Emergency Department: Admission: EM | Admit: 2018-01-16 | Discharge: 2018-01-16 | Discharge status: Absent without leave | Payer: Self-pay

## 2018-01-16 DIAGNOSIS — I517 Cardiomegaly: Secondary | ICD-10-CM | POA: Diagnosis not present

## 2018-01-16 DIAGNOSIS — I700 Atherosclerosis of aorta: Secondary | ICD-10-CM | POA: Diagnosis not present

## 2018-01-16 DIAGNOSIS — R05 Cough: Secondary | ICD-10-CM

## 2018-01-16 DIAGNOSIS — R059 Cough, unspecified: Secondary | ICD-10-CM

## 2018-01-16 DIAGNOSIS — R0602 Shortness of breath: Secondary | ICD-10-CM | POA: Diagnosis not present

## 2018-01-16 MED ORDER — BENZONATATE 100 MG PO CAPS: 100.0000 mg | ORAL_CAPSULE | Freq: Three times a day (TID) | ORAL | 0 refills | Status: DC

## 2018-01-16 NOTE — ED Triage Notes (Signed)
Pt c/o fatigue, chest congestion, cough with clear sputum production, shortness of breath on exertion.  Pt states that he was wheezing last night, but has not been wheezing today.  Onset x 9 days.  Charyl Bigger, CMA

## 2018-01-16 NOTE — ED Provider Notes (Addendum)
[name]    CSN: [id number] Arrival date & time: 01/16/18  1634     History   Chief Complaint Chief Complaint  Patient presents with  . Cough    HPI [name] is a 84 y.o. male.   HPI  [name] is a 84 y.o. male presenting to UC with wife c/o worsening cough the last 9 days.  He was seen at [facility] on 12/29/17 for URI symptoms and tx with doxycycline. Pt states symptoms improved but then worsened again 9 days ago.  Sputum is clear.  Wife became concerned because pt was wheezing last night but no wheezing today until this afternoon. Wife states if he exerts himself "like flapping his arms" he has some wheeze.  He has not taken any cough medication.  Denies fever, chills, n/v/d. Denies chest pain. Hx of afib. He is on Xarelto.  He had EKG performed on 12/30/17 by his PCP as routine. It showed he was in afib and pt notes his PCP was not concerned.     Past Medical History:  Diagnosis Date  . Anemia   . Antral gastritis 2013   EGD   . Atrial fibrillation ([place])   . CAD (coronary artery disease)    s/p CABG 1993 (L-LAD, S-RI, S-D1);   echo 1/10: EF 55%;    myoview 12/09: inf MI, no ischemia  . Candida esophagitis ([place]) 2013   EGD   . Cataracts, bilateral   . Family history of malignant neoplasm of gastrointestinal tract   . Folliculitis   . Hiatal hernia   . HTN (hypertension)   . Hyperlipidemia   . Irritable bladder   . Myocardial infarction ([place])   . Obesity   . Osteoarthritis     Patient Active Problem List   Diagnosis Date Noted  . CHF (congestive heart failure) ([place]) 12/17/2016  . Arthritis of left ankle 10/15/2016  . PCP NOTES >>>>> 10/11/2015  . Internal hemorrhoid 06/01/2014  . Carpal tunnel syndrome 02/15/2014  . Anemia 08/14/2012  . Annual physical exam 01/08/2012  . Atrial fibrillation ([place]) 03/29/2011  . BRADYCARDIA 06/19/2010  . Venous (peripheral) insufficiency 09/04/2009  . PALPITATIONS 07/07/2009  . OBESITY 07/04/2009  . Lower urinary  tract symptoms (LUTS) 07/04/2009  . DJD (degenerative joint disease) 07/04/2009  . SLEEP DISORDER 07/04/2009  . Dyspepsia 02/28/2009  . HEMATOCHEZIA 02/07/2009  . DM II (diabetes mellitus, type II), controlled ([place]) 04/25/2008  . Hyperlipidemia 06/25/2007  . Essential hypertension 06/25/2007  . CORONARY ARTERY DISEASE 06/25/2007    Past Surgical History:  Procedure Laterality Date  . CHOLECYSTECTOMY     laparoscopic '92  . CORONARY ARTERY BYPASS GRAFT     graft '92: LIMA-LAD, SVG - D2,R1, D1  . ENTEROSCOPY  08/22/2012   Procedure: ENTEROSCOPY;  Surgeon: Juanita Craver, MD;  Location: [facility];  Service: Endoscopy;  Laterality: N/A;  . KNEE SURGERY    . MOLE REMOVAL  2001 and 2008  . PILONIDAL CYST EXCISION         Home Medications    Prior to Admission medications   Medication Sig Start Date End Date Taking? Authorizing Provider  atorvastatin (LIPITOR) 80 MG tablet Take 1 tablet (80 mg total) by mouth at bedtime. 08/07/17  Yes Lelon Perla, MD  benazepril (LOTENSIN) 40 MG tablet Take 1 tablet (40 mg total) daily by mouth. 10/20/17  Yes Paz, Alda Berthold, MD  Cholecalciferol (VITAMIN D) 1000 UNITS capsule Take 1,000 Units by mouth daily.  Yes [provider]  Coenzyme Q10 (COQ10 PO) Take 1 capsule by mouth daily.   Yes [provider]  cyclobenzaprine (FLEXERIL) 5 MG tablet Take 1 at bedtime as needed for muscle relaxant. Caution: May cause drowsiness. 06/16/14  Yes Jacqulyn Cane, MD  diclofenac sodium (VOLTAREN) 1 % GEL Apply 4 g topically 4 (four) times daily. To affected joint. 10/15/16  Yes Gregor Hams, MD  diphenhydramine-acetaminophen (TYLENOL PM) 25-500 MG TABS tablet Take 2 tablets by mouth at bedtime.   Yes [provider]  doxycycline (VIBRAMYCIN) 100 MG capsule Take 1 capsule (100 mg total) by mouth 2 (two) times daily. Take with food. 12/29/17  Yes Kandra Nicolas, MD  furosemide (LASIX) 40 MG tablet Take 1 tablet (40 mg total) by mouth  daily. Keep appointment 12/22/17 03/22/18 Yes Lelon Perla, MD  Melatonin 5 MG TABS Take 5 mg by mouth at bedtime.   Yes [provider]  metoprolol succinate (TOPROL-XL) 25 MG 24 hr tablet Take 1 tablet (25 mg total) by mouth daily. 09/05/17  Yes Paz, Alda Berthold, MD  Multiple Vitamin (MULTIVITAMIN WITH MINERALS) TABS tablet Take 1 tablet by mouth daily.   Yes [provider]  pantoprazole (PROTONIX) 40 MG tablet Take 1 tablet (40 mg total) by mouth daily. 10/07/17  Yes Paz, Alda Berthold, MD  Probiotic Product (PROBIOTIC DAILY PO) Take 1 tablet by mouth daily.   Yes [provider]  tamsulosin (FLOMAX) 0.4 MG CAPS capsule Take 1 capsule (0.4 mg total) by mouth daily. 01/07/18  Yes Paz, Alda Berthold, MD  vitamin B-12 (CYANOCOBALAMIN) 1000 MCG tablet Take 1,000 mcg by mouth daily.   Yes [provider]  XARELTO 20 MG TABS tablet TAKE 1 TABLET BY MOUTH  DAILY WITH SUPPER 12/10/17  Yes Crenshaw, Denice Bors, MD  benzonatate (TESSALON) 100 MG capsule Take 1-2 capsules (100-200 mg total) by mouth every 8 (eight) hours. 01/16/18   Noe Gens, PA-C  nitroGLYCERIN (NITROSTAT) 0.4 MG SL tablet Place 1 tablet (0.4 mg total) under the tongue every 5 (five) minutes as needed for chest pain. 01/04/15   Lelon Perla, MD    Family History Family History  Problem Relation Age of Onset  . Coronary artery disease Father        and brother  . Heart attack Father        and brother-fatal  . Stroke Father   . Breast cancer Mother   . Alzheimer's disease Mother   . Colon cancer Maternal Uncle   . Esophageal cancer Neg Hx   . Stomach cancer Neg Hx   . Rectal cancer Neg Hx   . Prostate cancer Neg Hx     Social History Social History   Tobacco Use  . Smoking status: Former Smoker    Years: 16.00    Last attempt to quit: 08/14/1964    Years since quitting: 53.4  . Smokeless tobacco: Never Used  . Tobacco comment: quit 1965  Substance Use Topics  . Alcohol use: No  . Drug use: No      Allergies   Patient has no known allergies.   Review of Systems Review of Systems  Constitutional: Positive for fatigue. Negative for chills and fever.  HENT: Positive for congestion. Negative for ear pain, sore throat, trouble swallowing and voice change.   Respiratory: Positive for cough, shortness of breath and wheezing.   Cardiovascular: Negative for chest pain and palpitations.  Gastrointestinal: Negative for abdominal pain, diarrhea,  nausea and vomiting.  Musculoskeletal: Negative for arthralgias, back pain and myalgias.  Skin: Negative for rash.     Physical Exam Triage Vital Signs ED Triage Vitals [01/16/18 1702]  Enc Vitals Group     BP 104/68     Pulse Rate (!) 51     Resp      Temp 98.1 F (36.7 C)     Temp Source Oral     SpO2 94 %     Weight 213 lb 8 oz (96.8 kg)     Height 5\' 10"  (1.778 m)     Head Circumference      Peak Flow      Pain Score      Pain Loc      Pain Edu?      Excl. in [place]?    Orthostatic VS for the past 24 hrs:  BP- Lying Pulse- Lying BP- Sitting Pulse- Sitting BP- Standing at 0 minutes Pulse- Standing at 0 minutes  01/16/18 1732 145/76 64 131/70 62 137/66 73    Updated Vital Signs BP 104/68 (BP Location: Right Arm)   Pulse (!) 51   Temp 98.1 F (36.7 C) (Oral)   Ht 5\' 10"  (1.778 m)   Wt 213 lb 8 oz (96.8 kg)   SpO2 94%   BMI 30.63 kg/m   Visual Acuity Right Eye Distance:   Left Eye Distance:   Bilateral Distance:    Right Eye Near:   Left Eye Near:    Bilateral Near:     Physical Exam  Constitutional: He is oriented to person, place, and time. He appears well-developed and well-nourished. No distress.  HENT:  Head: Normocephalic and atraumatic.  Right Ear: Tympanic membrane normal.  Left Ear: Tympanic membrane normal.  Nose: Nose normal.  Mouth/Throat: Uvula is midline, oropharynx is clear and moist and mucous membranes are normal.  Eyes: EOM are normal.  Neck: Normal range of motion. Neck supple.   Cardiovascular: Normal rate. An irregularly irregular rhythm present.  Mild bradycardia in triage, regular rate on exam  Pulmonary/Chest: Effort normal and breath sounds normal. No stridor. No respiratory distress. He has no wheezes. He has no rales.  Musculoskeletal: Normal range of motion.  Neurological: He is alert and oriented to person, place, and time.  Skin: Skin is warm and dry. He is not diaphoretic.  Psychiatric: He has a normal mood and affect. His behavior is normal.  Nursing note and vitals reviewed.    UC Treatments / Results  Labs (all labs ordered are listed, but only abnormal results are displayed) Labs Reviewed - No data to display  EKG  EKG Interpretation None       Radiology Dg Chest 2 View  Result Date: 01/16/2018 CLINICAL DATA:  Congestion and cough.  Shortness of breath. EXAM: CHEST  2 VIEW COMPARISON:  December 29, 2017 FINDINGS: There is no edema or consolidation. Heart is mildly enlarged with pulmonary vascularity within normal limits. Patient is status post internal mammary bypass grafting. There is aortic atherosclerosis. No adenopathy. No evident bone lesions. IMPRESSION: Mild cardiac enlargement. No edema or consolidation. There is aortic atherosclerosis. Aortic Atherosclerosis (ICD10-I70.0). Electronically Signed   By: Lowella Grip III M.D.   On: 01/16/2018 17:38      Procedures Procedures (including critical care time)  Medications Ordered in UC Medications - No data to display   Initial Impression / Assessment and Plan / UC Course  I have reviewed the triage vital signs and the nursing notes.  Pertinent labs & imaging results that were available during my care of the patient were reviewed by me and considered in my medical decision making (see chart for details).     CXR: Impression- mild cardiac enlargement. No edema or consolidation. There is aortic atherosclerosis.   Reviewed orthostatic vitals. O2 Sat actually improved from  triage at 94% to 96-97% on RA  Pt appears well. NAD.  Reassured pt of no evidence of acute findings on CXR Encouraged f/u with PCP and cardiologist. Will tx cough with tessalon. Discussed symptoms that warrant emergent care in the ED.   Final Clinical Impressions(s) / UC Diagnoses   Final diagnoses:  Cough    ED Discharge Orders        Ordered    benzonatate (TESSALON) 100 MG capsule  Every 8 hours     01/16/18 1832       Controlled Substance Prescriptions [place] Controlled Substance Registry consulted? Not Applicable   Tyrell Antonio 01/16/18 1839    Noe Gens, PA-C 01/16/18 [place], PA-C 01/16/18 1946

## 2018-01-17 ENCOUNTER — Other Ambulatory Visit: Payer: Self-pay | Admitting: Internal Medicine

## 2018-01-27 ENCOUNTER — Encounter: Payer: Self-pay | Admitting: Cardiology

## 2018-01-27 ENCOUNTER — Ambulatory Visit: Payer: Medicare Other | Admitting: Cardiology

## 2018-01-27 VITALS — BP 132/88 | HR 73 | Ht 70.0 in | Wt 227.0 lb

## 2018-01-27 DIAGNOSIS — I10 Essential (primary) hypertension: Secondary | ICD-10-CM

## 2018-01-27 DIAGNOSIS — I4891 Unspecified atrial fibrillation: Secondary | ICD-10-CM | POA: Diagnosis not present

## 2018-01-27 DIAGNOSIS — I4821 Permanent atrial fibrillation: Secondary | ICD-10-CM

## 2018-01-27 DIAGNOSIS — I2510 Atherosclerotic heart disease of native coronary artery without angina pectoris: Secondary | ICD-10-CM

## 2018-01-27 DIAGNOSIS — E7800 Pure hypercholesterolemia, unspecified: Secondary | ICD-10-CM

## 2018-01-27 DIAGNOSIS — I482 Chronic atrial fibrillation: Secondary | ICD-10-CM | POA: Diagnosis not present

## 2018-01-27 LAB — CBC
Hematocrit: 42.2 % (ref 37.5–51.0)
Hemoglobin: 14.6 g/dL (ref 13.0–17.7)
MCH: 32.4 pg (ref 26.6–33.0)
MCHC: 34.6 g/dL (ref 31.5–35.7)
MCV: 94 fL (ref 79–97)
PLATELETS: 195 10*3/uL (ref 150–379)
RBC: 4.51 x10E6/uL (ref 4.14–5.80)
RDW: 14.1 % (ref 12.3–15.4)
WBC: 7.2 10*3/uL (ref 3.4–10.8)

## 2018-01-27 MED ORDER — RIVAROXABAN 20 MG PO TABS: ORAL_TABLET | ORAL | 1 refills | Status: DC

## 2018-01-27 NOTE — Patient Instructions (Signed)

## 2018-01-28 ENCOUNTER — Encounter: Payer: Self-pay | Admitting: *Deleted

## 2018-01-29 DIAGNOSIS — M545 Low back pain: Secondary | ICD-10-CM | POA: Diagnosis not present

## 2018-01-29 DIAGNOSIS — M6281 Muscle weakness (generalized): Secondary | ICD-10-CM | POA: Diagnosis not present

## 2018-01-29 DIAGNOSIS — M479 Spondylosis, unspecified: Secondary | ICD-10-CM | POA: Diagnosis not present

## 2018-02-03 ENCOUNTER — Other Ambulatory Visit: Payer: Self-pay | Admitting: Cardiology

## 2018-02-03 DIAGNOSIS — R262 Difficulty in walking, not elsewhere classified: Secondary | ICD-10-CM | POA: Diagnosis not present

## 2018-02-03 DIAGNOSIS — M545 Low back pain: Secondary | ICD-10-CM | POA: Diagnosis not present

## 2018-02-03 DIAGNOSIS — M6281 Muscle weakness (generalized): Secondary | ICD-10-CM | POA: Diagnosis not present

## 2018-02-09 DIAGNOSIS — R262 Difficulty in walking, not elsewhere classified: Secondary | ICD-10-CM | POA: Diagnosis not present

## 2018-02-09 DIAGNOSIS — M6281 Muscle weakness (generalized): Secondary | ICD-10-CM | POA: Diagnosis not present

## 2018-02-09 DIAGNOSIS — M545 Low back pain: Secondary | ICD-10-CM | POA: Diagnosis not present

## 2018-03-09 ENCOUNTER — Other Ambulatory Visit: Payer: Self-pay | Admitting: Internal Medicine

## 2018-03-16 ENCOUNTER — Other Ambulatory Visit: Payer: Self-pay | Admitting: Cardiology

## 2018-03-16 DIAGNOSIS — H35033 Hypertensive retinopathy, bilateral: Secondary | ICD-10-CM | POA: Diagnosis not present

## 2018-03-16 DIAGNOSIS — H353131 Nonexudative age-related macular degeneration, bilateral, early dry stage: Secondary | ICD-10-CM | POA: Diagnosis not present

## 2018-03-16 DIAGNOSIS — D3132 Benign neoplasm of left choroid: Secondary | ICD-10-CM | POA: Diagnosis not present

## 2018-03-16 DIAGNOSIS — H43813 Vitreous degeneration, bilateral: Secondary | ICD-10-CM | POA: Diagnosis not present

## 2018-03-16 DIAGNOSIS — Z961 Presence of intraocular lens: Secondary | ICD-10-CM | POA: Diagnosis not present

## 2018-03-30 ENCOUNTER — Other Ambulatory Visit: Payer: Self-pay | Admitting: Internal Medicine

## 2018-04-08 DIAGNOSIS — Z85828 Personal history of other malignant neoplasm of skin: Secondary | ICD-10-CM | POA: Diagnosis not present

## 2018-04-08 DIAGNOSIS — L218 Other seborrheic dermatitis: Secondary | ICD-10-CM | POA: Diagnosis not present

## 2018-04-08 DIAGNOSIS — L570 Actinic keratosis: Secondary | ICD-10-CM | POA: Diagnosis not present

## 2018-04-08 DIAGNOSIS — Z08 Encounter for follow-up examination after completed treatment for malignant neoplasm: Secondary | ICD-10-CM | POA: Diagnosis not present

## 2018-04-24 ENCOUNTER — Encounter: Payer: Medicare Other | Admitting: Internal Medicine

## 2018-05-01 ENCOUNTER — Ambulatory Visit (INDEPENDENT_AMBULATORY_CARE_PROVIDER_SITE_OTHER): Payer: Medicare Other | Admitting: Internal Medicine

## 2018-05-01 ENCOUNTER — Encounter: Payer: Self-pay | Admitting: Internal Medicine

## 2018-05-01 VITALS — BP 126/80 | HR 60 | Temp 97.4°F | Resp 16 | Ht 70.0 in | Wt 212.1 lb

## 2018-05-01 DIAGNOSIS — Z0000 Encounter for general adult medical examination without abnormal findings: Secondary | ICD-10-CM

## 2018-05-01 LAB — HEMOGLOBIN A1C: HEMOGLOBIN A1C: 6.4 % (ref 4.6–6.5)

## 2018-05-01 LAB — LIPID PANEL
CHOLESTEROL: 102 mg/dL (ref 0–200)
HDL: 28.7 mg/dL — ABNORMAL LOW (ref >39.00)
NonHDL: 73.6
Total CHOL/HDL Ratio: 4
Triglycerides: 239 mg/dL — ABNORMAL HIGH (ref 0.0–149.0)
VLDL: 47.8 mg/dL — ABNORMAL HIGH (ref 0.0–40.0)

## 2018-05-01 LAB — BASIC METABOLIC PANEL
BUN: 22 mg/dL (ref 6–23)
CALCIUM: 9.6 mg/dL (ref 8.4–10.5)
CO2: 26 meq/L (ref 19–32)
CREATININE: 1.42 mg/dL (ref 0.40–1.50)
Chloride: 105 mEq/L (ref 96–112)
GFR: 50.43 mL/min — AB (ref >60.00)
Glucose, Bld: 110 mg/dL — ABNORMAL HIGH (ref 70–99)
Potassium: 3.9 mEq/L (ref 3.5–5.1)
SODIUM: 141 meq/L (ref 135–145)

## 2018-05-01 LAB — LDL CHOLESTEROL, DIRECT: LDL DIRECT: 47 mg/dL

## 2018-05-01 LAB — TSH: TSH: 3.46 u[IU]/mL (ref 0.35–4.50)

## 2018-05-01 LAB — AST: AST: 23 U/L (ref 0–37)

## 2018-05-01 NOTE — Assessment & Plan Note (Signed)
--  Td 2013;    PNM shot 2013; prevnar 2015 ;zostavax 2016; shingrex not available --Cscope 02-2012 (-), no further screen per GI letter  --Prostate cancer screening: no further screening  --   Not fasting, will check labs: BMP, AST, ALT, FLP, A1c, TSH (last TSH was a slightly elevated) --Doing well with diet and exercise

## 2018-05-01 NOTE — Progress Notes (Signed)
Pre visit review using our clinic review tool, if applicable. No additional management support is needed unless otherwise documented below in the visit note. 

## 2018-05-01 NOTE — Patient Instructions (Signed)
GO TO THE LAB : Get the blood work     GO TO THE FRONT DESK Schedule your next appointment for a  Check up in 6 to 8 months

## 2018-05-01 NOTE — Progress Notes (Signed)
Subjective:    Patient ID: [name], male    DOB: 1931/04/30, 84 y.o.   MRN: [id number]  DOS:  05/01/2018 Type of visit - description : cpx Interval history: No major concerns   Review of Systems Occasionally sees lower extremity edema around the ankles, worse on the left.  Not every day. Occasionally urinary frequency and urgency.  No blood in the urine or difficulty urinating.  No nocturia. Denies dysphasia or odynophagia  Other than above, a 14 point review of systems is negative    Past Medical History:  Diagnosis Date  . Anemia   . Antral gastritis 2013   EGD   . Atrial fibrillation ([place])   . CAD (coronary artery disease)    s/p CABG 1993 (L-LAD, S-RI, S-D1);   echo 1/10: EF 55%;    myoview 12/09: inf MI, no ischemia  . Candida esophagitis ([place]) 2013   EGD   . Cataracts, bilateral   . Family history of malignant neoplasm of gastrointestinal tract   . Folliculitis   . Hiatal hernia   . HTN (hypertension)   . Hyperlipidemia   . Irritable bladder   . Myocardial infarction ([place])   . Obesity   . Osteoarthritis     Past Surgical History:  Procedure Laterality Date  . CHOLECYSTECTOMY     laparoscopic '92  . CORONARY ARTERY BYPASS GRAFT     graft '92: LIMA-LAD, SVG - D2,R1, D1  . ENTEROSCOPY  08/22/2012   Procedure: ENTEROSCOPY;  Surgeon: Juanita Craver, MD;  Location: [facility];  Service: Endoscopy;  Laterality: N/A;  . KNEE SURGERY    . MOLE REMOVAL  2001 and 2008  . PILONIDAL CYST EXCISION      Social History   Socioeconomic History  . Marital status: Widowed    Spouse name: Not on file  . Number of children: 1  . Years of education: Not on file  . Highest education level: Not on file  Occupational History  . Occupation: Retired    Fish farm manager: RETIRED  Social Needs  . Financial resource strain: Not on file  . Food insecurity:    Worry: Not on file    Inability: Not on file  . Transportation needs:    Medical: Not on file    Non-medical: Not on  file  Tobacco Use  . Smoking status: Former Smoker    Years: 16.00    Last attempt to quit: 08/14/1964    Years since quitting: 53.7  . Smokeless tobacco: Never Used  . Tobacco comment: quit 1965  Substance and Sexual Activity  . Alcohol use: No  . Drug use: No  . Sexual activity: Not on file  Lifestyle  . Physical activity:    Days per week: Not on file    Minutes per session: Not on file  . Stress: Not on file  Relationships  . Social connections:    Talks on phone: Not on file    Gets together: Not on file    Attends religious service: Not on file    Active member of club or organization: Not on file    Attends meetings of clubs or organizations: Not on file    Relationship status: Not on file  . Intimate partner violence:    Fear of current or ex partner: Not on file    Emotionally abused: Not on file    Physically abused: Not on file    Forced sexual activity: Not on file  Other Topics  Concern  . 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 Married '57- widowed 02-13-2023. 1 son -'21- died '30 MVA.    Lives by himself   No immediate family. Emergency contact : Daron Offer [phone] (friend)     Family History  Problem Relation Age of Onset  . Coronary artery disease Father        and brother  . Heart attack Father        and brother-fatal  . Stroke Father   . Breast cancer Mother   . Alzheimer's disease Mother   . Colon cancer Maternal Uncle   . Esophageal cancer Neg Hx   . Stomach cancer Neg Hx   . Rectal cancer Neg Hx   . Prostate cancer Neg Hx      Allergies as of 05/01/2018   No Known Allergies     Medication List        Accurate as of 05/01/18 11:59 PM. Always use your most recent med list.          atorvastatin 80 MG tablet Commonly known as:  LIPITOR Take 1 tablet (80 mg total) by mouth at bedtime. NEED OFFICE VISIT BEFORE ANY MORE REFILLS   benazepril 40 MG tablet Commonly known as:  LOTENSIN Take 1  tablet (40 mg total) by mouth daily.   COQ10 PO Take 1 capsule by mouth daily.   diphenhydramine-acetaminophen 25-500 MG Tabs tablet Commonly known as:  TYLENOL PM Take 2 tablets by mouth at bedtime.   furosemide 40 MG tablet Commonly known as:  LASIX Take 1 tablet (40 mg total) by mouth daily. Keep appointment   Melatonin 5 MG Tabs Take 5 mg by mouth at bedtime.   metoprolol succinate 25 MG 24 hr tablet Commonly known as:  TOPROL-XL Take 1 tablet (25 mg total) by mouth daily.   multivitamin with minerals Tabs tablet Take 1 tablet by mouth daily.   nitroGLYCERIN 0.4 MG SL tablet Commonly known as:  NITROSTAT Place 1 tablet (0.4 mg total) under the tongue every 5 (five) minutes as needed for chest pain.   pantoprazole 40 MG tablet Commonly known as:  PROTONIX Take 1 tablet (40 mg total) by mouth daily.   rivaroxaban 20 MG Tabs tablet Commonly known as:  XARELTO TAKE 1 TABLET BY MOUTH  DAILY WITH SUPPER   vitamin B-12 1000 MCG tablet Commonly known as:  CYANOCOBALAMIN Take 1,000 mcg by mouth daily.   Vitamin D 1000 units capsule Take 1,000 Units by mouth daily.          Objective:   Physical Exam BP 126/80 (BP Location: Left Arm, Patient Position: Sitting, Cuff Size: Small)   Pulse 60   Temp (!) 97.4 F (36.3 C) (Oral)   Resp 16   Ht 5\' 10"  (1.778 m)   Wt 212 lb 2 oz (96.2 kg)   SpO2 97%   BMI 30.44 kg/m  General:   Well developed, well nourished . NAD.  Neck: No  thyromegaly  HEENT:  Normocephalic . Face symmetric, atraumatic Lungs:  CTA B Normal respiratory effort, no intercostal retractions, no accessory muscle use. Heart: Irregularly irregular .   trace peri-ankle edema Abdomen:  Not distended, soft, non-tender. No rebound or rigidity.   Skin: Exposed areas without rash. Not pale. Not jaundice Neurologic:  alert & oriented X3.  Speech normal, gait appropriate for age and unassisted Strength symmetric and appropriate for age.  Psych: Cognition and judgment appear intact.  Cooperative with normal attention span and concentration.  Behavior appropriate. No anxious or depressed appearing.     Assessment & Plan:   Assessment  DM --diet control, no neuropathy HTN Hyperlipidemia CV: --CAD, CABG 1993, Myoview 2009 no ischemia. --Atrial fibrillation -- rate control, xarelto -- chronic combined systolic/diastolic congestive heart failure Venous insufficiency, mild LE edema L>>R  LUTS:  DRE-PSA wnl 07-2016 MSK: DJD, R wrist Fx in the 80s GI: --Candida esophagitis, antral gastritis --->  2013 per EGD --HH Sees derm x 2/year  PLAN: DM, HTN, hyperlipidemia, CAD, atrial fibrillation: all seem stable on Lipitor, Lotensin, Lasix, Toprol, Xarelto. Checking labs. LUTS: Has symptoms on and off, not bothersome, recommend observation for now H/o  Candida esophagitis: Asymptomatic.   DC PPIs on RTC? RTC 6 to 8 months

## 2018-05-03 NOTE — Assessment & Plan Note (Signed)
DM, HTN, hyperlipidemia, CAD, atrial fibrillation: all seem stable on Lipitor, Lotensin, Lasix, Toprol, Xarelto. Checking labs. LUTS: Has symptoms on and off, not bothersome, recommend observation for now H/o  Candida esophagitis: Asymptomatic.   DC PPIs on RTC? RTC 6 to 8 months

## 2018-06-15 DIAGNOSIS — H26493 Other secondary cataract, bilateral: Secondary | ICD-10-CM | POA: Diagnosis not present

## 2018-06-15 DIAGNOSIS — H0100A Unspecified blepharitis right eye, upper and lower eyelids: Secondary | ICD-10-CM | POA: Diagnosis not present

## 2018-06-15 DIAGNOSIS — H35033 Hypertensive retinopathy, bilateral: Secondary | ICD-10-CM | POA: Diagnosis not present

## 2018-06-15 DIAGNOSIS — H0100B Unspecified blepharitis left eye, upper and lower eyelids: Secondary | ICD-10-CM | POA: Diagnosis not present

## 2018-06-15 DIAGNOSIS — H02831 Dermatochalasis of right upper eyelid: Secondary | ICD-10-CM | POA: Diagnosis not present

## 2018-06-15 LAB — HM DIABETES EYE EXAM

## 2018-06-16 ENCOUNTER — Encounter: Payer: Self-pay | Admitting: Internal Medicine

## 2018-06-23 ENCOUNTER — Other Ambulatory Visit: Payer: Self-pay

## 2018-06-23 MED ORDER — FUROSEMIDE 40 MG PO TABS: 40.0000 mg | ORAL_TABLET | Freq: Every day | ORAL | 1 refills | Status: DC

## 2018-06-26 ENCOUNTER — Other Ambulatory Visit: Payer: Self-pay | Admitting: Cardiology

## 2018-06-26 MED ORDER — FUROSEMIDE 40 MG PO TABS: 40.0000 mg | ORAL_TABLET | Freq: Every day | ORAL | 1 refills | Status: DC

## 2018-06-26 NOTE — Telephone Encounter (Signed)
°*  STAT* If patient is at the pharmacy, call can be transferred to refill team.   1. Which medications need to be refilled? (please list name of each medication and dose if known) Furosemide 40mg   2. Which pharmacy/location (including street and city if local pharmacy) is medication to be sent to [facility]   3. Do they need a 30 day or 90 day supply? [name]

## 2018-06-26 NOTE — Telephone Encounter (Signed)
Rx request sent to pharmacy.  

## 2018-07-15 DIAGNOSIS — H0100A Unspecified blepharitis right eye, upper and lower eyelids: Secondary | ICD-10-CM | POA: Diagnosis not present

## 2018-07-15 DIAGNOSIS — H5231 Anisometropia: Secondary | ICD-10-CM | POA: Diagnosis not present

## 2018-07-15 DIAGNOSIS — H35033 Hypertensive retinopathy, bilateral: Secondary | ICD-10-CM | POA: Diagnosis not present

## 2018-07-15 DIAGNOSIS — H0100B Unspecified blepharitis left eye, upper and lower eyelids: Secondary | ICD-10-CM | POA: Diagnosis not present

## 2018-07-15 DIAGNOSIS — H26493 Other secondary cataract, bilateral: Secondary | ICD-10-CM | POA: Diagnosis not present

## 2018-07-21 DIAGNOSIS — Z79899 Other long term (current) drug therapy: Secondary | ICD-10-CM | POA: Diagnosis not present

## 2018-07-21 DIAGNOSIS — K219 Gastro-esophageal reflux disease without esophagitis: Secondary | ICD-10-CM | POA: Diagnosis not present

## 2018-07-21 DIAGNOSIS — E785 Hyperlipidemia, unspecified: Secondary | ICD-10-CM | POA: Diagnosis not present

## 2018-07-21 DIAGNOSIS — Z961 Presence of intraocular lens: Secondary | ICD-10-CM | POA: Diagnosis not present

## 2018-07-21 DIAGNOSIS — H5231 Anisometropia: Secondary | ICD-10-CM | POA: Diagnosis not present

## 2018-07-21 DIAGNOSIS — Z7901 Long term (current) use of anticoagulants: Secondary | ICD-10-CM | POA: Diagnosis not present

## 2018-07-21 DIAGNOSIS — I10 Essential (primary) hypertension: Secondary | ICD-10-CM | POA: Diagnosis not present

## 2018-07-21 DIAGNOSIS — H35033 Hypertensive retinopathy, bilateral: Secondary | ICD-10-CM | POA: Diagnosis not present

## 2018-07-21 HISTORY — PX: INTRAOCULAR LENS EXCHANGE: SHX1841

## 2018-07-21 HISTORY — PX: CATARACT EXTRACTION: SUR2

## 2018-07-22 DIAGNOSIS — H0100A Unspecified blepharitis right eye, upper and lower eyelids: Secondary | ICD-10-CM | POA: Diagnosis not present

## 2018-07-22 DIAGNOSIS — Z961 Presence of intraocular lens: Secondary | ICD-10-CM | POA: Diagnosis not present

## 2018-07-22 DIAGNOSIS — H26492 Other secondary cataract, left eye: Secondary | ICD-10-CM | POA: Diagnosis not present

## 2018-07-22 DIAGNOSIS — H0100B Unspecified blepharitis left eye, upper and lower eyelids: Secondary | ICD-10-CM | POA: Diagnosis not present

## 2018-07-22 DIAGNOSIS — H5231 Anisometropia: Secondary | ICD-10-CM | POA: Diagnosis not present

## 2018-07-22 LAB — HM DIABETES EYE EXAM

## 2018-07-29 DIAGNOSIS — H0100A Unspecified blepharitis right eye, upper and lower eyelids: Secondary | ICD-10-CM | POA: Diagnosis not present

## 2018-07-29 DIAGNOSIS — H5231 Anisometropia: Secondary | ICD-10-CM | POA: Diagnosis not present

## 2018-07-29 DIAGNOSIS — H26492 Other secondary cataract, left eye: Secondary | ICD-10-CM | POA: Diagnosis not present

## 2018-07-29 DIAGNOSIS — Z961 Presence of intraocular lens: Secondary | ICD-10-CM | POA: Diagnosis not present

## 2018-07-29 DIAGNOSIS — H0100B Unspecified blepharitis left eye, upper and lower eyelids: Secondary | ICD-10-CM | POA: Diagnosis not present

## 2018-08-04 ENCOUNTER — Other Ambulatory Visit: Payer: Self-pay | Admitting: Cardiology

## 2018-08-06 ENCOUNTER — Other Ambulatory Visit: Payer: Self-pay | Admitting: Cardiology

## 2018-08-06 DIAGNOSIS — I4891 Unspecified atrial fibrillation: Secondary | ICD-10-CM

## 2018-08-06 DIAGNOSIS — Z961 Presence of intraocular lens: Secondary | ICD-10-CM | POA: Diagnosis not present

## 2018-08-06 DIAGNOSIS — H00012 Hordeolum externum right lower eyelid: Secondary | ICD-10-CM | POA: Diagnosis not present

## 2018-08-19 DIAGNOSIS — Z961 Presence of intraocular lens: Secondary | ICD-10-CM | POA: Diagnosis not present

## 2018-08-19 LAB — HM DIABETES EYE EXAM

## 2018-09-01 ENCOUNTER — Encounter: Payer: Self-pay | Admitting: Cardiology

## 2018-09-09 ENCOUNTER — Other Ambulatory Visit: Payer: Self-pay | Admitting: Internal Medicine

## 2018-09-10 NOTE — Progress Notes (Signed)
HPI: FU coronary artery disease and atrial fibrillation. Patient is status post coronary artery bypassing graft in 1993 with a LIMA to the LAD, saphenous vein graft to the ramus intermedius and saphenous vein graft to the first diagonal. CT of the abdomen in January of 2009 showed no renal artery stenosis. There was no aortic aneurysm. Nuclear study in June 2014 showed scar in the mid/apical inferior wall and apex. There was a small area of ischemia in the base to mid anterior wall. The study was unchanged compared to May of 2012. The study was not gated. We have treated medically. Echo 12/17 showed EF 40-45, mild MR, severe LAE, moderate RAE, mild RVE, mild TR. Since last seen,patient denies dyspnea, chest pain, palpitations or syncope.  Occasional mild dizziness when standing unchanged.  He denies fevers, chills, abdominal pain, melena or hematochezia.  No diarrhea.  Normal p.o. intake.  Current Outpatient Medications  Medication Sig Dispense Refill  . atorvastatin (LIPITOR) 80 MG tablet TAKE 1 TABLET BY MOUTH EVERY DAY 90 tablet 2  . benazepril (LOTENSIN) 40 MG tablet Take 1 tablet (40 mg total) by mouth daily. 90 tablet 1  . Cholecalciferol (VITAMIN D) 1000 UNITS capsule Take 1,000 Units by mouth daily.      . Coenzyme Q10 (COQ10 PO) Take 1 capsule by mouth daily.    . diphenhydramine-acetaminophen (TYLENOL PM) 25-500 MG TABS tablet Take 2 tablets by mouth at bedtime.    . furosemide (LASIX) 40 MG tablet Take 1 tablet (40 mg total) by mouth daily. 90 tablet 1  . Melatonin 5 MG TABS Take 5 mg by mouth at bedtime.    . metoprolol succinate (TOPROL-XL) 25 MG 24 hr tablet Take 1 tablet (25 mg total) by mouth daily. 90 tablet 1  . Multiple Vitamin (MULTIVITAMIN WITH MINERALS) TABS tablet Take 1 tablet by mouth daily.    . nitroGLYCERIN (NITROSTAT) 0.4 MG SL tablet Place 1 tablet (0.4 mg total) under the tongue every 5 (five) minutes as needed for chest pain. 25 tablet 3  . pantoprazole  (PROTONIX) 40 MG tablet Take 1 tablet (40 mg total) by mouth daily. 90 tablet 3  . vitamin B-12 (CYANOCOBALAMIN) 1000 MCG tablet Take 1,000 mcg by mouth daily.    Alveda Reasons 20 MG TABS tablet TAKE 1 TABLET BY MOUTH  DAILY WITH SUPPER 90 tablet 1   No current facility-administered medications for this visit.      Past Medical History:  Diagnosis Date  . Anemia   . Antral gastritis 2013   EGD   . Atrial fibrillation ([place])   . CAD (coronary artery disease)    s/p CABG 1993 (L-LAD, S-RI, S-D1);   echo 1/10: EF 55%;    myoview 12/09: inf MI, no ischemia  . Candida esophagitis ([place]) 2013   EGD   . Cataracts, bilateral   . Family history of malignant neoplasm of gastrointestinal tract   . Folliculitis   . Hiatal hernia   . HTN (hypertension)   . Hyperlipidemia   . Irritable bladder   . Myocardial infarction ([place])   . Obesity   . Osteoarthritis     Past Surgical History:  Procedure Laterality Date  . CHOLECYSTECTOMY     laparoscopic '92  . CORONARY ARTERY BYPASS GRAFT     graft '92: LIMA-LAD, SVG - D2,R1, D1  . ENTEROSCOPY  08/22/2012   Procedure: ENTEROSCOPY;  Surgeon: Juanita Craver, MD;  Location: [facility];  Service: Endoscopy;  Laterality: N/A;  .  KNEE SURGERY    . MOLE REMOVAL  2001 and 2008  . PILONIDAL CYST EXCISION      Social History   Socioeconomic History  . Marital status: Widowed    Spouse name: Not on file  . Number of children: 1  . Years of education: Not on file  . Highest education level: Not on file  Occupational History  . Occupation: Retired    Fish farm manager: RETIRED  Social Needs  . Financial resource strain: Not on file  . Food insecurity:    Worry: Not on file    Inability: Not on file  . Transportation needs:    Medical: Not on file    Non-medical: Not on file  Tobacco Use  . Smoking status: Former Smoker    Years: 16.00    Last attempt to quit: 08/14/1964    Years since quitting: 54.1  . Smokeless tobacco: Never Used  . Tobacco comment:  quit 1965  Substance and Sexual Activity  . Alcohol use: No  . Drug use: No  . Sexual activity: Not on file  Lifestyle  . Physical activity:    Days per week: Not on file    Minutes per session: Not on file  . Stress: Not on file  Relationships  . Social connections:    Talks on phone: Not on file    Gets together: Not on file    Attends religious service: Not on file    Active member of club or organization: Not on file    Attends meetings of clubs or organizations: Not on file    Relationship status: Not on file  . Intimate partner violence:    Fear of current or ex partner: Not on file    Emotionally abused: Not on file    Physically abused: Not on file    Forced sexual activity: Not on file  Other Topics Concern  . 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 Married '57- widowed 03-10-23. 1 son -'31- died '20 MVA.    Lives by himself   No immediate family. Emergency contact : Daron Offer [phone] (friend)    Family History  Problem Relation Age of Onset  . Coronary artery disease Father        and brother  . Heart attack Father        and brother-fatal  . Stroke Father   . Breast cancer Mother   . Alzheimer's disease Mother   . Colon cancer Maternal Uncle   . Esophageal cancer Neg Hx   . Stomach cancer Neg Hx   . Rectal cancer Neg Hx   . Prostate cancer Neg Hx     ROS: no fevers or chills, productive cough, hemoptysis, dysphasia, odynophagia, melena, hematochezia, dysuria, hematuria, rash, seizure activity, orthopnea, PND, pedal edema, claudication. Remaining systems are negative.  Physical Exam: Well-developed well-nourished in no acute distress.  Skin is warm and dry.  HEENT is normal.  Neck is supple.  Chest is clear to auscultation with normal expansion.  Cardiovascular exam is irregular Abdominal exam nontender or distended. No masses palpated. Extremities show no edema. neuro grossly intact  A/P  1 permanent  atrial fibrillation-plan to continue Toprol for rate control.  Continue Xarelto.  Check hemoglobin and renal function.  2 chronic combined systolic/diastolic congestive heart failure-he is euvolemic on examination today.  Continue present dose of Lasix.  Check potassium and renal function.  Continue  fluid restriction and low-sodium diet.  3 coronary artery disease-continue statin.  He is not on aspirin given need for Xarelto.  4 hypertension-patient's blood pressure is low today.  However he is not having any symptoms whatsoever.  He denies dizziness, chest pain or dyspnea.  No recent fevers or chills to suggest infection.  No melena or hematochezia.  I have asked him to hold lisinopril tomorrow and the following day decreased to 10 mg daily.  He will call us with his blood pressure tomorrow and Friday and we will adjust further as needed.  As outlined he is completely asymptomatic.  5 hyperlipidemia-continue statin.  Kirk Ruths, MD

## 2018-09-16 ENCOUNTER — Ambulatory Visit: Payer: Medicare Other | Admitting: Cardiology

## 2018-09-16 ENCOUNTER — Encounter: Payer: Self-pay | Admitting: Cardiology

## 2018-09-16 VITALS — BP 84/60 | HR 90 | Ht 70.0 in | Wt 207.8 lb

## 2018-09-16 DIAGNOSIS — I5042 Chronic combined systolic (congestive) and diastolic (congestive) heart failure: Secondary | ICD-10-CM

## 2018-09-16 DIAGNOSIS — I4821 Permanent atrial fibrillation: Secondary | ICD-10-CM | POA: Diagnosis not present

## 2018-09-16 DIAGNOSIS — I2510 Atherosclerotic heart disease of native coronary artery without angina pectoris: Secondary | ICD-10-CM | POA: Diagnosis not present

## 2018-09-16 LAB — CBC
HCT: 40.2 % (ref 38.5–50.0)
Hemoglobin: 13.8 g/dL (ref 13.2–17.1)
MCH: 31.9 pg (ref 27.0–33.0)
MCHC: 34.3 g/dL (ref 32.0–36.0)
MCV: 93.1 fL (ref 80.0–100.0)
MPV: 10.5 fL (ref 7.5–12.5)
PLATELETS: 174 10*3/uL (ref 140–400)
RBC: 4.32 10*6/uL (ref 4.20–5.80)
RDW: 12.9 % (ref 11.0–15.0)
WBC: 6.3 10*3/uL (ref 3.8–10.8)

## 2018-09-16 LAB — BASIC METABOLIC PANEL
BUN / CREAT RATIO: 15 (calc) (ref 6–22)
BUN: 26 mg/dL — ABNORMAL HIGH (ref 7–25)
CALCIUM: 9.2 mg/dL (ref 8.6–10.3)
CO2: 29 mmol/L (ref 20–32)
Chloride: 103 mmol/L (ref 98–110)
Creat: 1.74 mg/dL — ABNORMAL HIGH (ref 0.70–1.11)
GLUCOSE: 103 mg/dL — AB (ref 65–99)
Potassium: 4.3 mmol/L (ref 3.5–5.3)
Sodium: 139 mmol/L (ref 135–146)

## 2018-09-16 MED ORDER — BENAZEPRIL HCL 10 MG PO TABS: 10.0000 mg | ORAL_TABLET | Freq: Every day | ORAL | 3 refills | Status: DC

## 2018-09-16 NOTE — Patient Instructions (Signed)
Medication Instructions:   DECREASE BENAZEPRIL TO 10 MG ONCE DAILY  Labwork:  Your physician recommends that you HAVE LAB WORK TODAY  Follow-Up:  Your physician recommends that you schedule a follow-up appointment in: IN 2 WEEKS

## 2018-09-17 ENCOUNTER — Other Ambulatory Visit: Payer: Self-pay | Admitting: *Deleted

## 2018-09-17 ENCOUNTER — Telehealth: Payer: Self-pay | Admitting: *Deleted

## 2018-09-17 DIAGNOSIS — I4891 Unspecified atrial fibrillation: Secondary | ICD-10-CM

## 2018-09-17 MED ORDER — RIVAROXABAN 15 MG PO TABS: 15.0000 mg | ORAL_TABLET | Freq: Every day | ORAL | 3 refills | Status: DC

## 2018-09-17 NOTE — Telephone Encounter (Signed)
Spoke with pt, his bp this morning was 123/61, 120/63 and 127/68. Per dr Stanford Breed the patient was told to take benazepril 10 mg today. He is also aware of the labs and change in xarelto dosage. [facility] called and mail order changed prior to next shipment.

## 2018-09-17 NOTE — Telephone Encounter (Signed)
-----   Message from Lelon Perla, MD sent at 09/17/2018  7:30 AM EDT ----- GFR 42; change xarelto to 15 mg daily Kirk Ruths

## 2018-09-23 NOTE — Progress Notes (Deleted)
HPI: FU coronary artery disease and atrial fibrillation. Patient is status post coronary artery bypassing graft in 1993 with a LIMA to the LAD, saphenous vein graft to the ramus intermedius and saphenous vein graft to the first diagonal. CT of the abdomen in January of 2009 showed no renal artery stenosis. There was no aortic aneurysm. Nuclear study in June 2014 showed scar in the mid/apical inferior wall and apex. There was a small area of ischemia in the base to mid anterior wall. The study was unchanged compared to May of 2012. The study was not gated. We have treated medically. Echo 12/17 showed EF 40-45, mild MR, severe LAE, moderate RAE, mild RVE, mild TR. at last office visit patient's blood pressure was low with systolic of 84.  His medications were reduced.  Since last seen,   Current Outpatient Medications  Medication Sig Dispense Refill  . atorvastatin (LIPITOR) 80 MG tablet TAKE 1 TABLET BY MOUTH EVERY DAY 90 tablet 2  . benazepril (LOTENSIN) 10 MG tablet Take 1 tablet (10 mg total) by mouth daily. 90 tablet 3  . Cholecalciferol (VITAMIN D) 1000 UNITS capsule Take 1,000 Units by mouth daily.      . Coenzyme Q10 (COQ10 PO) Take 1 capsule by mouth daily.    . diphenhydramine-acetaminophen (TYLENOL PM) 25-500 MG TABS tablet Take 2 tablets by mouth at bedtime.    . furosemide (LASIX) 40 MG tablet Take 1 tablet (40 mg total) by mouth daily. 90 tablet 1  . Melatonin 5 MG TABS Take 5 mg by mouth at bedtime.    . metoprolol succinate (TOPROL-XL) 25 MG 24 hr tablet Take 1 tablet (25 mg total) by mouth daily. 90 tablet 1  . Multiple Vitamin (MULTIVITAMIN WITH MINERALS) TABS tablet Take 1 tablet by mouth daily.    . nitroGLYCERIN (NITROSTAT) 0.4 MG SL tablet Place 1 tablet (0.4 mg total) under the tongue every 5 (five) minutes as needed for chest pain. 25 tablet 3  . pantoprazole (PROTONIX) 40 MG tablet Take 1 tablet (40 mg total) by mouth daily. 90 tablet 3  . Rivaroxaban (XARELTO) 15 MG  TABS tablet Take 1 tablet (15 mg total) by mouth daily with supper. 90 tablet 3  . vitamin B-12 (CYANOCOBALAMIN) 1000 MCG tablet Take 1,000 mcg by mouth daily.     No current facility-administered medications for this visit.      Past Medical History:  Diagnosis Date  . Anemia   . Antral gastritis 2013   EGD   . Atrial fibrillation ([place])   . CAD (coronary artery disease)    s/p CABG 1993 (L-LAD, S-RI, S-D1);   echo 1/10: EF 55%;    myoview 12/09: inf MI, no ischemia  . Candida esophagitis ([place]) 2013   EGD   . Cataracts, bilateral   . Family history of malignant neoplasm of gastrointestinal tract   . Folliculitis   . Hiatal hernia   . HTN (hypertension)   . Hyperlipidemia   . Irritable bladder   . Myocardial infarction ([place])   . Obesity   . Osteoarthritis     Past Surgical History:  Procedure Laterality Date  . CHOLECYSTECTOMY     laparoscopic '92  . CORONARY ARTERY BYPASS GRAFT     graft '92: LIMA-LAD, SVG - D2,R1, D1  . ENTEROSCOPY  08/22/2012   Procedure: ENTEROSCOPY;  Surgeon: Juanita Craver, MD;  Location: [facility];  Service: Endoscopy;  Laterality: N/A;  . KNEE SURGERY    .  MOLE REMOVAL  2001 and 2008  . PILONIDAL CYST EXCISION      Social History   Socioeconomic History  . Marital status: Widowed    Spouse name: Not on file  . Number of children: 1  . Years of education: Not on file  . Highest education level: Not on file  Occupational History  . Occupation: Retired    Fish farm manager: RETIRED  Social Needs  . Financial resource strain: Not on file  . Food insecurity:    Worry: Not on file    Inability: Not on file  . Transportation needs:    Medical: Not on file    Non-medical: Not on file  Tobacco Use  . Smoking status: Former Smoker    Years: 16.00    Last attempt to quit: 08/14/1964    Years since quitting: 54.1  . Smokeless tobacco: Never Used  . Tobacco comment: quit 1965  Substance and Sexual Activity  . Alcohol use: No  . Drug use: No  .  Sexual activity: Not on file  Lifestyle  . Physical activity:    Days per week: Not on file    Minutes per session: Not on file  . Stress: Not on file  Relationships  . Social connections:    Talks on phone: Not on file    Gets together: Not on file    Attends religious service: Not on file    Active member of club or organization: Not on file    Attends meetings of clubs or organizations: Not on file    Relationship status: Not on file  . Intimate partner violence:    Fear of current or ex partner: Not on file    Emotionally abused: Not on file    Physically abused: Not on file    Forced sexual activity: Not on file  Other Topics Concern  . 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 Married '57- widowed 03/03/2023. 1 son -'2- died '11 MVA.    Lives by himself   No immediate family. Emergency contact : Daron Offer [phone] (friend)    Family History  Problem Relation Age of Onset  . Coronary artery disease Father        and brother  . Heart attack Father        and brother-fatal  . Stroke Father   . Breast cancer Mother   . Alzheimer's disease Mother   . Colon cancer Maternal Uncle   . Esophageal cancer Neg Hx   . Stomach cancer Neg Hx   . Rectal cancer Neg Hx   . Prostate cancer Neg Hx     ROS: no fevers or chills, productive cough, hemoptysis, dysphasia, odynophagia, melena, hematochezia, dysuria, hematuria, rash, seizure activity, orthopnea, PND, pedal edema, claudication. Remaining systems are negative.  Physical Exam: Well-developed well-nourished in no acute distress.  Skin is warm and dry.  HEENT is normal.  Neck is supple.  Chest is clear to auscultation with normal expansion.  Cardiovascular exam is regular rate and rhythm.  Abdominal exam nontender or distended. No masses palpated. Extremities show no edema. neuro grossly intact  ECG- personally reviewed  A/P  1  Kirk Ruths, MD

## 2018-09-29 NOTE — Progress Notes (Signed)
HPI: FU coronary artery disease and atrial fibrillation. Patient is status post coronary artery bypassing graft in 1993 with a LIMA to the LAD, saphenous vein graft to the ramus intermedius and saphenous vein graft to the first diagonal. CT of the abdomen in January of 2009 showed no renal artery stenosis. There was no aortic aneurysm. Nuclear study in June 2014 showed scar in the mid/apical inferior wall and apex. There was a small area of ischemia in the base to mid anterior wall. The study was unchanged compared to May of 2012. The study was not gated. We have treated medically. Echo 12/17 showed EF 40-45, mild MR, severe LAE, moderate RAE, mild RVE, mild TR. at last office visit patient's systolic blood pressure was low 80s.  We adjusted his blood pressure regimen.  Since last seen,patient denies dyspnea, chest pain, palpitations or syncope.  Current Outpatient Medications  Medication Sig Dispense Refill  . atorvastatin (LIPITOR) 80 MG tablet TAKE 1 TABLET BY MOUTH EVERY DAY 90 tablet 2  . benazepril (LOTENSIN) 10 MG tablet Take 1 tablet (10 mg total) by mouth daily. 90 tablet 3  . Cholecalciferol (VITAMIN D) 1000 UNITS capsule Take 1,000 Units by mouth daily.      . Coenzyme Q10 (COQ10 PO) Take 1 capsule by mouth daily.    . diphenhydramine-acetaminophen (TYLENOL PM) 25-500 MG TABS tablet Take 2 tablets by mouth at bedtime.    . furosemide (LASIX) 40 MG tablet Take 1 tablet (40 mg total) by mouth daily. 90 tablet 1  . Melatonin 5 MG TABS Take 5 mg by mouth at bedtime.    . metoprolol succinate (TOPROL-XL) 25 MG 24 hr tablet Take 1 tablet (25 mg total) by mouth daily. 90 tablet 1  . Multiple Vitamin (MULTIVITAMIN WITH MINERALS) TABS tablet Take 1 tablet by mouth daily.    . nitroGLYCERIN (NITROSTAT) 0.4 MG SL tablet Place 1 tablet (0.4 mg total) under the tongue every 5 (five) minutes as needed for chest pain. 25 tablet 3  . pantoprazole (PROTONIX) 40 MG tablet Take 1 tablet (40 mg  total) by mouth daily. 90 tablet 3  . Rivaroxaban (XARELTO) 15 MG TABS tablet Take 1 tablet (15 mg total) by mouth daily with supper. 90 tablet 3  . vitamin B-12 (CYANOCOBALAMIN) 1000 MCG tablet Take 1,000 mcg by mouth daily.     No current facility-administered medications for this visit.      Past Medical History:  Diagnosis Date  . Anemia   . Antral gastritis 2013   EGD   . Atrial fibrillation ([place])   . CAD (coronary artery disease)    s/p CABG 1993 (L-LAD, S-RI, S-D1);   echo 1/10: EF 55%;    myoview 12/09: inf MI, no ischemia  . Candida esophagitis ([place]) 2013   EGD   . Cataracts, bilateral   . Family history of malignant neoplasm of gastrointestinal tract   . Folliculitis   . Hiatal hernia   . HTN (hypertension)   . Hyperlipidemia   . Irritable bladder   . Myocardial infarction ([employer])   . Obesity   . Osteoarthritis     Past Surgical History:  Procedure Laterality Date  . CHOLECYSTECTOMY     laparoscopic '92  . CORONARY ARTERY BYPASS GRAFT     graft '92: LIMA-LAD, SVG - D2,R1, D1  . ENTEROSCOPY  08/22/2012   Procedure: ENTEROSCOPY;  Surgeon: Juanita Craver, MD;  Location: [facility];  Service: Endoscopy;  Laterality: N/A;  .  KNEE SURGERY    . MOLE REMOVAL  2001 and 2008  . PILONIDAL CYST EXCISION      Social History   Socioeconomic History  . Marital status: Widowed    Spouse name: Not on file  . Number of children: 1  . Years of education: Not on file  . Highest education level: Not on file  Occupational History  . Occupation: Retired    Fish farm manager: RETIRED  Social Needs  . Financial resource strain: Not on file  . Food insecurity:    Worry: Not on file    Inability: Not on file  . Transportation needs:    Medical: Not on file    Non-medical: Not on file  Tobacco Use  . Smoking status: Former Smoker    Years: 16.00    Last attempt to quit: 08/14/1964    Years since quitting: 54.1  . Smokeless tobacco: Never Used  . Tobacco comment: quit 1965    Substance and Sexual Activity  . Alcohol use: No  . Drug use: No  . Sexual activity: Not on file  Lifestyle  . Physical activity:    Days per week: Not on file    Minutes per session: Not on file  . Stress: Not on file  Relationships  . Social connections:    Talks on phone: Not on file    Gets together: Not on file    Attends religious service: Not on file    Active member of club or organization: Not on file    Attends meetings of clubs or organizations: Not on file    Relationship status: Not on file  . Intimate partner violence:    Fear of current or ex partner: Not on file    Emotionally abused: Not on file    Physically abused: Not on file    Forced sexual activity: Not on file  Other Topics Concern  . 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 Married '57- widowed 2023/03/08. 1 son -'46- died '69 MVA.    Lives by himself   No immediate family. Emergency contact : Daron Offer [phone] (friend)    Family History  Problem Relation Age of Onset  . Coronary artery disease Father        and brother  . Heart attack Father        and brother-fatal  . Stroke Father   . Breast cancer Mother   . Alzheimer's disease Mother   . Colon cancer Maternal Uncle   . Esophageal cancer Neg Hx   . Stomach cancer Neg Hx   . Rectal cancer Neg Hx   . Prostate cancer Neg Hx     ROS: no fevers or chills, productive cough, hemoptysis, dysphasia, odynophagia, melena, hematochezia, dysuria, hematuria, rash, seizure activity, orthopnea, PND, pedal edema, claudication. Remaining systems are negative.  Physical Exam: Well-developed well-nourished in no acute distress.  Skin is warm and dry.  HEENT is normal.  Neck is supple.  Chest is clear to auscultation with normal expansion.  Cardiovascular exam is irregular Abdominal exam nontender or distended. No masses palpated. Extremities show no edema. neuro grossly intact   A/P  1  hypertension-patient's blood pressure was low at time of previous office visit.  It has improved today.  We will continue with present medications and he will follow.  2 permanent atrial fibrillation-continue present dose of Toprol.  Continue Xarelto (dose recently reduced as  GFR 15).  3 coronary artery disease-plan to continue statin.  He is not on aspirin given need for anticoagulation.  4 chronic combined systolic/diastolic congestive heart failure-patient remains euvolemic on examination.  Continue present dose of Lasix.  We will also continue with fluid restriction and low-sodium diet.  5 hyperlipidemia-continue statin.  Kirk Ruths, MD

## 2018-09-30 ENCOUNTER — Encounter: Payer: Self-pay | Admitting: Cardiology

## 2018-09-30 ENCOUNTER — Ambulatory Visit (INDEPENDENT_AMBULATORY_CARE_PROVIDER_SITE_OTHER): Payer: Medicare Other | Admitting: Cardiology

## 2018-09-30 ENCOUNTER — Ambulatory Visit: Payer: Medicare Other | Admitting: Cardiology

## 2018-09-30 VITALS — BP 105/63 | HR 58 | Ht 70.0 in | Wt 210.1 lb

## 2018-09-30 DIAGNOSIS — I2510 Atherosclerotic heart disease of native coronary artery without angina pectoris: Secondary | ICD-10-CM | POA: Diagnosis not present

## 2018-09-30 DIAGNOSIS — I4821 Permanent atrial fibrillation: Secondary | ICD-10-CM | POA: Diagnosis not present

## 2018-09-30 DIAGNOSIS — I10 Essential (primary) hypertension: Secondary | ICD-10-CM | POA: Diagnosis not present

## 2018-09-30 NOTE — Patient Instructions (Signed)
Your physician recommends that you schedule a follow-up appointment in: 6 MONTHS WITH DR CRENSHAW PLEASE GIVE OUR OFFICE A CALL 2 MONTHS PRIOR TO THAT APPOINTMENT TIME TO SCHEDULE   

## 2018-10-02 ENCOUNTER — Other Ambulatory Visit: Payer: Self-pay | Admitting: Internal Medicine

## 2018-10-02 NOTE — Telephone Encounter (Signed)
Refill request for Flomax- no longer on med list. Please advise.

## 2018-10-02 NOTE — Telephone Encounter (Signed)
He has prostate symptoms on and off, a prescription was sent.

## 2018-10-12 DIAGNOSIS — Z08 Encounter for follow-up examination after completed treatment for malignant neoplasm: Secondary | ICD-10-CM | POA: Diagnosis not present

## 2018-10-12 DIAGNOSIS — L570 Actinic keratosis: Secondary | ICD-10-CM | POA: Diagnosis not present

## 2018-10-12 DIAGNOSIS — L821 Other seborrheic keratosis: Secondary | ICD-10-CM | POA: Diagnosis not present

## 2018-10-12 DIAGNOSIS — Z85828 Personal history of other malignant neoplasm of skin: Secondary | ICD-10-CM | POA: Diagnosis not present

## 2018-11-27 ENCOUNTER — Encounter: Payer: Self-pay | Admitting: Internal Medicine

## 2018-11-27 ENCOUNTER — Ambulatory Visit (INDEPENDENT_AMBULATORY_CARE_PROVIDER_SITE_OTHER): Payer: Medicare Other | Admitting: Internal Medicine

## 2018-11-27 VITALS — BP 126/68 | HR 37 | Temp 97.7°F | Resp 16 | Ht 70.0 in | Wt 216.5 lb

## 2018-11-27 DIAGNOSIS — E1159 Type 2 diabetes mellitus with other circulatory complications: Secondary | ICD-10-CM | POA: Diagnosis not present

## 2018-11-27 DIAGNOSIS — I2510 Atherosclerotic heart disease of native coronary artery without angina pectoris: Secondary | ICD-10-CM | POA: Diagnosis not present

## 2018-11-27 DIAGNOSIS — I10 Essential (primary) hypertension: Secondary | ICD-10-CM | POA: Diagnosis not present

## 2018-11-27 DIAGNOSIS — I2583 Coronary atherosclerosis due to lipid rich plaque: Secondary | ICD-10-CM

## 2018-11-27 DIAGNOSIS — R194 Change in bowel habit: Secondary | ICD-10-CM

## 2018-11-27 DIAGNOSIS — N402 Nodular prostate without lower urinary tract symptoms: Secondary | ICD-10-CM

## 2018-11-27 LAB — BASIC METABOLIC PANEL
BUN: 25 mg/dL — AB (ref 6–23)
CALCIUM: 9.2 mg/dL (ref 8.4–10.5)
CO2: 28 mEq/L (ref 19–32)
Chloride: 105 mEq/L (ref 96–112)
Creatinine, Ser: 1.61 mg/dL — ABNORMAL HIGH (ref 0.40–1.50)
GFR: 43.56 mL/min — AB (ref >60.00)
GLUCOSE: 122 mg/dL — AB (ref 70–99)
POTASSIUM: 4.3 meq/L (ref 3.5–5.1)
Sodium: 141 mEq/L (ref 135–145)

## 2018-11-27 LAB — HEMOGLOBIN A1C: Hgb A1c MFr Bld: 6.5 % (ref 4.6–6.5)

## 2018-11-27 LAB — PSA: PSA: 2.7 ng/mL (ref 0.10–4.00)

## 2018-11-27 NOTE — Progress Notes (Signed)
Subjective:    Patient ID: [name], male    DOB: 05-10-1931, 84 y.o.   MRN: [id number]  DOS:  11/27/2018 Type of visit - description : Routine visit CAD: Cardiology note reviewed HTN: Good med compliance, ambulatory BPs within normal DM: Due for A1c Also, he reports problems with bowel movements for the last 2 to 3 months.  He feels that after a bowel movement, he does not empty completely the rectum, and sometimes afterwards he leaks some stool.  The stool is loose. He denies any rectal pain or itching. No nausea, vomiting or weight loss. No blood or mucus in the stools. Overall symptoms have decreased over the last week.   Review of Systems As above  Past Medical History:  Diagnosis Date  . Anemia   . Antral gastritis 2013   EGD   . Atrial fibrillation ([place])   . CAD (coronary artery disease)    s/p CABG 1993 (L-LAD, S-RI, S-D1);   echo 1/10: EF 55%;    myoview 12/09: inf MI, no ischemia  . Candida esophagitis ([place]) 2013   EGD   . Cataracts, bilateral   . Family history of malignant neoplasm of gastrointestinal tract   . Folliculitis   . Hiatal hernia   . HTN (hypertension)   . Hyperlipidemia   . Irritable bladder   . Myocardial infarction ([place])   . Obesity   . Osteoarthritis     Past Surgical History:  Procedure Laterality Date  . CATARACT EXTRACTION Right 07/21/2018  . CHOLECYSTECTOMY     laparoscopic '92  . CORONARY ARTERY BYPASS GRAFT     graft '92: LIMA-LAD, SVG - D2,R1, D1  . ENTEROSCOPY  08/22/2012   Procedure: ENTEROSCOPY;  Surgeon: Juanita Craver, MD;  Location: [facility];  Service: Endoscopy;  Laterality: N/A;  . INTRAOCULAR LENS EXCHANGE Right 07/21/2018  . KNEE SURGERY    . MOLE REMOVAL  2001 and 2008  . PILONIDAL CYST EXCISION      Social History   Socioeconomic History  . Marital status: Widowed    Spouse name: Not on file  . Number of children: 1  . Years of education: Not on file  . Highest education level: Not on file    Occupational History  . Occupation: Retired    Fish farm manager: RETIRED  Social Needs  . Financial resource strain: Not on file  . Food insecurity:    Worry: Not on file    Inability: Not on file  . Transportation needs:    Medical: Not on file    Non-medical: Not on file  Tobacco Use  . Smoking status: Former Smoker    Years: 16.00    Last attempt to quit: 08/14/1964    Years since quitting: 54.3  . Smokeless tobacco: Never Used  . Tobacco comment: quit 1965  Substance and Sexual Activity  . Alcohol use: No  . Drug use: No  . Sexual activity: Not on file  Lifestyle  . Physical activity:    Days per week: Not on file    Minutes per session: Not on file  . Stress: Not on file  Relationships  . Social connections:    Talks on phone: Not on file    Gets together: Not on file    Attends religious service: Not on file    Active member of club or organization: Not on file    Attends meetings of clubs or organizations: Not on file    Relationship status: Not on  file  . Intimate partner violence:    Fear of current or ex partner: Not on file    Emotionally abused: Not on file    Physically abused: Not on file    Forced sexual activity: Not on file  Other Topics Concern  . 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 Married '57- widowed 02-13-2023. 1 son -'23- died '71 MVA.    Lives by himself   No immediate family. Emergency contact : Daron Offer [phone] (friend)      Allergies as of 11/27/2018   No Known Allergies     Medication List       Accurate as of November 27, 2018 11:59 PM. Always use your most recent med list.        atorvastatin 80 MG tablet Commonly known as:  LIPITOR TAKE 1 TABLET BY MOUTH EVERY DAY   benazepril 10 MG tablet Commonly known as:  LOTENSIN Take 1 tablet (10 mg total) by mouth daily.   COQ10 PO Take 1 capsule by mouth daily.   diphenhydramine-acetaminophen 25-500 MG Tabs tablet Commonly known as:   TYLENOL PM Take 2 tablets by mouth at bedtime.   furosemide 40 MG tablet Commonly known as:  LASIX Take 1 tablet (40 mg total) by mouth daily.   Melatonin 5 MG Tabs Take 5 mg by mouth at bedtime.   metoprolol succinate 25 MG 24 hr tablet Commonly known as:  TOPROL-XL Take 1 tablet (25 mg total) by mouth daily.   multivitamin with minerals Tabs tablet Take 1 tablet by mouth daily.   nitroGLYCERIN 0.4 MG SL tablet Commonly known as:  NITROSTAT Place 1 tablet (0.4 mg total) under the tongue every 5 (five) minutes as needed for chest pain.   pantoprazole 40 MG tablet Commonly known as:  PROTONIX Take 1 tablet (40 mg total) by mouth daily.   Rivaroxaban 15 MG Tabs tablet Commonly known as:  XARELTO Take 1 tablet (15 mg total) by mouth daily with supper.   tamsulosin 0.4 MG Caps capsule Commonly known as:  FLOMAX TAKE ONE CAPSULE BY MOUTH EVERY DAY   vitamin B-12 1000 MCG tablet Commonly known as:  CYANOCOBALAMIN Take 1,000 mcg by mouth daily.   Vitamin D 1000 units capsule Take 1,000 Units by mouth daily.           Objective:   Physical Exam BP 126/68 (BP Location: Left Arm, Patient Position: Sitting, Cuff Size: Normal)   Pulse (!) 37   Temp 97.7 F (36.5 C) (Oral)   Resp 16   Ht 5\' 10"  (1.778 m)   Wt 216 lb 8 oz (98.2 kg)   SpO2 96%   BMI 31.06 kg/m  General:   Well developed, NAD, BMI noted.  HEENT:  Normocephalic . Face symmetric, atraumatic Lungs:  CTA B Normal respiratory effort, no intercostal retractions, no accessory muscle use. Heart: RRR,  no murmur.  no pretibial edema bilaterally  Abdomen:  Not distended, soft, non-tender. No rebound or rigidity.   DRE: Sphincter tone slightly decreased?.  Otherwise rectal exam is normal.  Prostate: Soft, nontender, small nodule on the right. Skin: Not pale. Not jaundice Neurologic:  alert & oriented X3.  Speech normal, gait appropriate for age and unassisted Psych--  Cognition and judgment appear  intact.  Cooperative with normal attention span and concentration.  Behavior appropriate. No anxious or depressed appearing.     Assessment & Plan:  Assessment  DM --diet control, no neuropathy HTN Hyperlipidemia CV: --CAD, CABG 1993, Myoview 2009 no ischemia. --Atrial fibrillation -- rate control, xarelto -- chronic combined systolic/diastolic congestive heart failure Venous insufficiency, mild LE edema L>>R  LUTS:  DRE-PSA wnl 07-2016 MSK: DJD, R wrist Fx in the 80s GI: --Recurrent melena: Work-up 2013 Dr. Sharlett Iles: Colonoscopy, EGD, capsule endoscopy.  Felt to be due to NSAIDs --Candida esophagitis, antral gastritis --->  2013 per EGD --HH Sees derm x 2/year  PLAN: Atrial fibrillation, CAD, CHF: Last visit with cardiology 09/30/2018, was recommended to continue same medications, was felt to be stable DM: Diet controlled, check A1c HTN: On Lotensin, metoprolol.  Last creatinine slightly elevated, check a BMP Change in bowel habits: No weight loss, no blood in the stools, last CBC satisfactory.  Will recommend increase fiber intake with Metamucil and observation; call if no back to normal in 2-3 weeks. Prostate nodule: Incidental finding, I did a rectal exam mostly to assess the change in bowel habits but he did have a nodule.  We will check a PSA.  Explained the patient this could be observed versus referral to urology, pending PSA. RTC 3 months

## 2018-11-27 NOTE — Patient Instructions (Addendum)
Please schedule Medicare Wellness with Glenard Haring.   GO TO THE LAB : Get the blood work     GO TO THE FRONT DESK Schedule your next appointment for a checkup in 3 months   Increase your fiber intake (fruits, vegetables) Metamucil 1 or 2 capsules in the morning with lots of fluids  Call if your stomach is not going back to normal in the next 3 to 4 weeks

## 2018-11-27 NOTE — Progress Notes (Signed)
Pre visit review using our clinic review tool, if applicable. No additional management support is needed unless otherwise documented below in the visit note. 

## 2018-11-29 NOTE — Assessment & Plan Note (Signed)
Atrial fibrillation, CAD, CHF: Last visit with cardiology 09/30/2018, was recommended to continue same medications, was felt to be stable DM: Diet controlled, check A1c HTN: On Lotensin, metoprolol.  Last creatinine slightly elevated, check a BMP Change in bowel habits: No weight loss, no blood in the stools, last CBC satisfactory.  Will recommend increase fiber intake with Metamucil and observation; call if no back to normal in 2-3 weeks. Prostate nodule: Incidental finding, I did a rectal exam mostly to assess the change in bowel habits but he did have a nodule.  We will check a PSA.  Explained the patient this could be observed versus referral to urology, pending PSA. RTC 3 months

## 2018-12-30 ENCOUNTER — Other Ambulatory Visit: Payer: Self-pay | Admitting: Internal Medicine

## 2019-02-10 LAB — HM DIABETES FOOT EXAM: HM Diabetic Foot Exam: NORMAL

## 2019-02-19 ENCOUNTER — Encounter: Payer: Self-pay | Admitting: Internal Medicine

## 2019-02-24 ENCOUNTER — Other Ambulatory Visit: Payer: Self-pay

## 2019-02-24 ENCOUNTER — Encounter: Payer: Self-pay | Admitting: Internal Medicine

## 2019-02-24 ENCOUNTER — Ambulatory Visit (INDEPENDENT_AMBULATORY_CARE_PROVIDER_SITE_OTHER): Payer: Medicare Other | Admitting: Internal Medicine

## 2019-02-24 VITALS — BP 126/82 | HR 61 | Temp 98.1°F | Resp 16 | Ht 70.0 in | Wt 213.2 lb

## 2019-02-24 DIAGNOSIS — I10 Essential (primary) hypertension: Secondary | ICD-10-CM | POA: Diagnosis not present

## 2019-02-24 DIAGNOSIS — E7800 Pure hypercholesterolemia, unspecified: Secondary | ICD-10-CM | POA: Diagnosis not present

## 2019-02-24 DIAGNOSIS — E1159 Type 2 diabetes mellitus with other circulatory complications: Secondary | ICD-10-CM | POA: Diagnosis not present

## 2019-02-24 LAB — BASIC METABOLIC PANEL
BUN: 33 mg/dL — AB (ref 6–23)
CO2: 30 meq/L (ref 19–32)
Calcium: 9.7 mg/dL (ref 8.4–10.5)
Chloride: 103 mEq/L (ref 96–112)
Creatinine, Ser: 1.53 mg/dL — ABNORMAL HIGH (ref 0.40–1.50)
GFR: 43.44 mL/min — ABNORMAL LOW (ref >60.00)
GLUCOSE: 125 mg/dL — AB (ref 70–99)
POTASSIUM: 4.4 meq/L (ref 3.5–5.1)
Sodium: 140 mEq/L (ref 135–145)

## 2019-02-24 LAB — ALT: ALT: 19 U/L (ref 0–53)

## 2019-02-24 LAB — LIPID PANEL
Cholesterol: 122 mg/dL (ref 0–200)
HDL: 34.5 mg/dL — AB (ref >39.00)
LDL Cholesterol: 57 mg/dL (ref 0–99)
NonHDL: 87.8
TRIGLYCERIDES: 152 mg/dL — AB (ref 0.0–149.0)
Total CHOL/HDL Ratio: 4
VLDL: 30.4 mg/dL (ref 0.0–40.0)

## 2019-02-24 LAB — AST: AST: 23 U/L (ref 0–37)

## 2019-02-24 NOTE — Progress Notes (Signed)
Pre visit review using our clinic review tool, if applicable. No additional management support is needed unless otherwise documented below in the visit note. 

## 2019-02-24 NOTE — Patient Instructions (Signed)
GO TO THE LAB : Get the blood work     GO TO THE FRONT DESK Schedule your next appointment for a physical exam in 4 to 5 months  For ankle pain: Ice the area twice a day as needed Tylenol  500 mg OTC 2 tabs a day every 8 hours as needed for pain Please avoid ibuprofen, naproxen or similar anti-inflammatories  Check the  blood pressure 2 or 3 times a week Be sure your blood pressure is between 110/65 and  135/85. If it is consistently higher or lower, let me know

## 2019-02-24 NOTE — Progress Notes (Signed)
Subjective:    Patient ID: [name], male    DOB: 22-Oct-1931, 85 y.o.   MRN: [id number]  DOS:  02/24/2019 Type of visit - description: Follow-up No major concerns Did report several months history of left ankle pain and some swelling. Denies any redness or warmness. No injury Has not needed any medication for pain. Medications reviewed, good compliance, no ambulatory or blood sugar readings.  Review of Systems LUTS: Occasional symptoms but they are at baseline or better Denies chest pain difficulty breathing No diarrhea blood in the stools  Past Medical History:  Diagnosis Date  . Anemia   . Antral gastritis 2013   EGD   . Atrial fibrillation ([place])   . CAD (coronary artery disease)    s/p CABG 1993 (L-LAD, S-RI, S-D1);   echo 1/10: EF 55%;    myoview 12/09: inf MI, no ischemia  . Candida esophagitis ([place]) 2013   EGD   . Cataracts, bilateral   . Family history of malignant neoplasm of gastrointestinal tract   . Folliculitis   . Hiatal hernia   . HTN (hypertension)   . Hyperlipidemia   . Irritable bladder   . Myocardial infarction ([place])   . Obesity   . Osteoarthritis     Past Surgical History:  Procedure Laterality Date  . CATARACT EXTRACTION Right 07/21/2018  . CHOLECYSTECTOMY     laparoscopic '92  . CORONARY ARTERY BYPASS GRAFT     graft '92: LIMA-LAD, SVG - D2,R1, D1  . ENTEROSCOPY  08/22/2012   Procedure: ENTEROSCOPY;  Surgeon: Juanita Craver, MD;  Location: [facility];  Service: Endoscopy;  Laterality: N/A;  . INTRAOCULAR LENS EXCHANGE Right 07/21/2018  . KNEE SURGERY    . MOLE REMOVAL  2001 and 2008  . PILONIDAL CYST EXCISION      Social History   Socioeconomic History  . Marital status: Widowed    Spouse name: Not on file  . Number of children: 1  . Years of education: Not on file  . Highest education level: Not on file  Occupational History  . Occupation: Retired    Fish farm manager: RETIRED  Social Needs  . Financial resource strain: Not on file  .  Food insecurity:    Worry: Not on file    Inability: Not on file  . Transportation needs:    Medical: Not on file    Non-medical: Not on file  Tobacco Use  . Smoking status: Former Smoker    Years: 16.00    Last attempt to quit: 08/14/1964    Years since quitting: 54.5  . Smokeless tobacco: Never Used  . Tobacco comment: quit 1965  Substance and Sexual Activity  . Alcohol use: No  . Drug use: No  . Sexual activity: Not on file  Lifestyle  . Physical activity:    Days per week: Not on file    Minutes per session: Not on file  . Stress: Not on file  Relationships  . Social connections:    Talks on phone: Not on file    Gets together: Not on file    Attends religious service: Not on file    Active member of club or organization: Not on file    Attends meetings of clubs or organizations: Not on file    Relationship status: Not on file  . Intimate partner violence:    Fear of current or ex partner: Not on file    Emotionally abused: Not on file    Physically abused:  Not on file    Forced sexual activity: Not on file  Other Topics Concern  . 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 Married '57- widowed 2023-02-22. 1 son -'33- died '22 MVA.    Lives by himself   No immediate family. Emergency contact : Daron Offer [phone] (friend)      Allergies as of 02/24/2019   No Known Allergies     Medication List       Accurate as of February 24, 2019 10:07 AM. Always use your most recent med list.        atorvastatin 80 MG tablet Commonly known as:  LIPITOR TAKE 1 TABLET BY MOUTH EVERY DAY   benazepril 10 MG tablet Commonly known as:  LOTENSIN Take 1 tablet (10 mg total) by mouth daily.   COQ10 PO Take 1 capsule by mouth daily.   diphenhydramine-acetaminophen 25-500 MG Tabs tablet Commonly known as:  TYLENOL PM Take 2 tablets by mouth at bedtime.   furosemide 40 MG tablet Commonly known as:  LASIX Take 1 tablet (40 mg total) by  mouth daily.   Melatonin 5 MG Tabs Take 5 mg by mouth at bedtime.   metoprolol succinate 25 MG 24 hr tablet Commonly known as:  TOPROL-XL Take 1 tablet (25 mg total) by mouth daily.   multivitamin with minerals Tabs tablet Take 1 tablet by mouth daily.   nitroGLYCERIN 0.4 MG SL tablet Commonly known as:  Nitrostat Place 1 tablet (0.4 mg total) under the tongue every 5 (five) minutes as needed for chest pain.   pantoprazole 40 MG tablet Commonly known as:  PROTONIX Take 1 tablet (40 mg total) by mouth daily.   Rivaroxaban 15 MG Tabs tablet Commonly known as:  XARELTO Take 1 tablet (15 mg total) by mouth daily with supper.   tamsulosin 0.4 MG Caps capsule Commonly known as:  FLOMAX Take 1 capsule (0.4 mg total) by mouth daily.   vitamin B-12 1000 MCG tablet Commonly known as:  CYANOCOBALAMIN Take 1,000 mcg by mouth daily.   Vitamin D 1000 units capsule Take 1,000 Units by mouth daily.           Objective:   Physical Exam BP 126/82 (BP Location: Left Arm, Patient Position: Sitting, Cuff Size: Normal)   Pulse 61   Temp 98.1 F (36.7 C) (Oral)   Resp 16   Ht 5\' 10"  (1.778 m)   Wt 213 lb 4 oz (96.7 kg)   SpO2 98%   BMI 30.60 kg/m  General:   Well developed, NAD, BMI noted. HEENT:  Normocephalic . Face symmetric, atraumatic Lungs:  CTA B Normal respiratory effort, no intercostal retractions, no accessory muscle use. Heart: Irregularly irregular,  no murmur.  MSK: Both ankles have bony changes consistent with DJD, no redness, effusion, warmness.  ROM slightly limited bilaterally.  No areas of particular tenderness. Skin: Not pale. Not jaundice Neurologic:  alert & oriented X3.  Speech normal, gait appropriate for age and unassisted Psych--  Cognition and judgment appear intact.  Cooperative with normal attention span and concentration.  Behavior appropriate. No anxious or depressed appearing.      Assessment    Assessment  DM --diet control, no  neuropathy HTN Hyperlipidemia CRI:  CV: --CAD, CABG 1993, Myoview 2009 no ischemia. --Atrial fibrillation -- rate control, xarelto -- chronic combined systolic/diastolic congestive heart failure Venous insufficiency, mild LE edema L>>R  LUTS:  DRE-PSA  wnl 07-2016 MSK: DJD, R wrist Fx in the 80s GI: --Recurrent melena: Work-up 2013 Dr. Sharlett Iles: Colonoscopy, EGD, capsule endoscopy.  Felt to be due to NSAIDs --Candida esophagitis, antral gastritis --->  2013 per EGD --HH Sees derm x 2/year  PLAN: DM: Diet controlled, last A1c satisfactory HTN: Currently on Lotensin, Lasix, Toprol.  Check a BMP.  Seems controlled High cholesterol: On Lipitor,Check FLP. CRI: Baseline creatinine slightly elevated, will continue monitoring Prostate nodule: See last visit, PSA was stable, minimal LUTS.  No further action needed at this time. RTC 4 to 6 months, CPX

## 2019-02-25 NOTE — Assessment & Plan Note (Signed)
DM: Diet controlled, last A1c satisfactory HTN: Currently on Lotensin, Lasix, Toprol.  Check a BMP.  Seems controlled High cholesterol: On Lipitor,Check FLP. CRI: Baseline creatinine slightly elevated, will continue monitoring Prostate nodule: See last visit, PSA was stable, minimal LUTS.  No further action needed at this time. RTC 4 to 6 months, CPX

## 2019-03-07 ENCOUNTER — Other Ambulatory Visit: Payer: Self-pay | Admitting: Internal Medicine

## 2019-03-17 ENCOUNTER — Other Ambulatory Visit: Payer: Self-pay | Admitting: Cardiology

## 2019-04-14 DIAGNOSIS — L570 Actinic keratosis: Secondary | ICD-10-CM | POA: Diagnosis not present

## 2019-04-14 DIAGNOSIS — Z08 Encounter for follow-up examination after completed treatment for malignant neoplasm: Secondary | ICD-10-CM | POA: Diagnosis not present

## 2019-04-14 DIAGNOSIS — Z85828 Personal history of other malignant neoplasm of skin: Secondary | ICD-10-CM | POA: Diagnosis not present

## 2019-04-16 ENCOUNTER — Ambulatory Visit (INDEPENDENT_AMBULATORY_CARE_PROVIDER_SITE_OTHER): Payer: Medicare Other

## 2019-04-16 ENCOUNTER — Other Ambulatory Visit: Payer: Self-pay

## 2019-04-16 ENCOUNTER — Ambulatory Visit (INDEPENDENT_AMBULATORY_CARE_PROVIDER_SITE_OTHER): Payer: Medicare Other | Admitting: Sports Medicine

## 2019-04-16 ENCOUNTER — Encounter: Payer: Self-pay | Admitting: Sports Medicine

## 2019-04-16 DIAGNOSIS — M48061 Spinal stenosis, lumbar region without neurogenic claudication: Secondary | ICD-10-CM

## 2019-04-16 DIAGNOSIS — M545 Low back pain: Secondary | ICD-10-CM | POA: Diagnosis not present

## 2019-04-16 DIAGNOSIS — M47816 Spondylosis without myelopathy or radiculopathy, lumbar region: Secondary | ICD-10-CM | POA: Insufficient documentation

## 2019-04-16 MED ORDER — TRAMADOL HCL 50 MG PO TABS: 50.0000 mg | ORAL_TABLET | Freq: Three times a day (TID) | ORAL | 0 refills | Status: DC | PRN

## 2019-04-16 MED ORDER — CYCLOBENZAPRINE HCL 10 MG PO TABS
ORAL_TABLET | ORAL | 0 refills | Status: DC
Start: 2019-04-16 — End: 2019-05-19

## 2019-04-16 MED ORDER — PREDNISONE 50 MG PO TABS: ORAL_TABLET | ORAL | 0 refills | Status: DC

## 2019-04-16 NOTE — Assessment & Plan Note (Addendum)
Moderate to severe at L4-L5 based on a 2008 MRI. Facet arthritis at multiple levels. Prednisone, x-rays, home physical therapy. Avoiding NSAIDs due to Xarelto, adding tramadol for pain relief. Adding a bit of Flexeril for use at night. Return to see me in 4 to 6 weeks, MR for interventional planning if no better versus switching to formal PT.

## 2019-04-16 NOTE — Progress Notes (Signed)
Subjective:    CC: Back pain  HPI: [name] is a pleasant 85 year old male, he has a history of lumbar spinal stenosis and facet arthritis.  More recently he did some digging in the garden, now has pain in the left side of his low back, moderate, persistent, localized without radiation, better with sitting, worse with standing, twisting.  Nothing radicular, no bowel or bladder dysfunction, saddle numbness, constitutional symptoms.  I reviewed the past medical history, family history, social history, surgical history, and allergies today and no changes were needed.  Please see the problem list section below in epic for further details.  Past Medical History: Past Medical History:  Diagnosis Date  . Anemia   . Antral gastritis 2013   EGD   . Atrial fibrillation ([place])   . CAD (coronary artery disease)    s/p CABG 1993 (L-LAD, S-RI, S-D1);   echo 1/10: EF 55%;    myoview 12/09: inf MI, no ischemia  . Candida esophagitis ([place]) 2013   EGD   . Cataracts, bilateral   . Family history of malignant neoplasm of gastrointestinal tract   . Folliculitis   . Hiatal hernia   . HTN (hypertension)   . Hyperlipidemia   . Irritable bladder   . Myocardial infarction ([place])   . Obesity   . Osteoarthritis    Past Surgical History: Past Surgical History:  Procedure Laterality Date  . CATARACT EXTRACTION Right 07/21/2018  . CHOLECYSTECTOMY     laparoscopic '92  . CORONARY ARTERY BYPASS GRAFT     graft '92: LIMA-LAD, SVG - D2,R1, D1  . ENTEROSCOPY  08/22/2012   Procedure: ENTEROSCOPY;  Surgeon: Juanita Craver, MD;  Location: [facility];  Service: Endoscopy;  Laterality: N/A;  . INTRAOCULAR LENS EXCHANGE Right 07/21/2018  . KNEE SURGERY    . MOLE REMOVAL  2001 and 2008  . PILONIDAL CYST EXCISION     Social History: Social History   Socioeconomic History  . Marital status: Widowed    Spouse name: Not on file  . Number of children: 1  . Years of education: Not on file  . Highest education level:  Not on file  Occupational History  . Occupation: Retired    Fish farm manager: RETIRED  Social Needs  . Financial resource strain: Not on file  . Food insecurity:    Worry: Not on file    Inability: Not on file  . Transportation needs:    Medical: Not on file    Non-medical: Not on file  Tobacco Use  . Smoking status: Former Smoker    Years: 16.00    Last attempt to quit: 08/14/1964    Years since quitting: 54.7  . Smokeless tobacco: Never Used  . Tobacco comment: quit 1965  Substance and Sexual Activity  . Alcohol use: No  . Drug use: No  . Sexual activity: Not on file  Lifestyle  . Physical activity:    Days per week: Not on file    Minutes per session: Not on file  . Stress: Not on file  Relationships  . Social connections:    Talks on phone: Not on file    Gets together: Not on file    Attends religious service: Not on file    Active member of club or organization: Not on file    Attends meetings of clubs or organizations: Not on file    Relationship status: Not on file  Other Topics Concern  . Not on file  Social History Narrative  Lost his only child   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 Married '57- widowed 08-Mar-2023. 1 son -'72- died '34 MVA.    Lives by himself   No immediate family. Emergency contact : Daron Offer [phone] (friend)   Family History: Family History  Problem Relation Age of Onset  . Coronary artery disease Father        and brother  . Heart attack Father        and brother-fatal  . Stroke Father   . Breast cancer Mother   . Alzheimer's disease Mother   . Colon cancer Maternal Uncle   . Esophageal cancer Neg Hx   . Stomach cancer Neg Hx   . Rectal cancer Neg Hx   . Prostate cancer Neg Hx    Allergies: No Known Allergies Medications: See med rec.  Review of Systems: No fevers, chills, night sweats, weight loss, chest pain, or shortness of breath.   Objective:    General: Well Developed, well nourished, and in no acute distress.  Neuro: Alert and oriented  x3, extra-ocular muscles intact, sensation grossly intact.  HEENT: Normocephalic, atraumatic, pupils equal round reactive to light, neck supple, no masses, no lymphadenopathy, thyroid nonpalpable.  Skin: Warm and dry, no rashes. Cardiac: Regular rate and rhythm, no murmurs rubs or gallops, no lower extremity edema.  Respiratory: Clear to auscultation bilaterally. Not using accessory muscles, speaking in full sentences. Back Exam:  Inspection: Unremarkable  Motion: Flexion 45 deg, Extension 45 deg, Side Bending to 45 deg bilaterally,  Rotation to 45 deg bilaterally  SLR laying: Negative  XSLR laying: Negative  Palpable tenderness: None. FABER: negative. Sensory change: Gross sensation intact to all lumbar and sacral dermatomes.  Reflexes: 2+ at both patellar tendons, 2+ at achilles tendons, Babinski's downgoing.  Strength at foot  Plantar-flexion: 5/5 Dorsi-flexion: 5/5 Eversion: 5/5 Inversion: 5/5  Leg strength  Quad: 5/5 Hamstring: 5/5 Hip flexor: 5/5 Hip abductors: 5/5  Gait unremarkable.  Impression and Recommendations:    Lumbar spinal stenosis Moderate to severe at L4-L5 based on a 2008 MRI. Facet arthritis at multiple levels. Prednisone, x-rays, home physical therapy. Avoiding NSAIDs due to Xarelto, adding tramadol for pain relief. Adding a bit of Flexeril for use at night. Return to see me in 4 to 6 weeks, MR for interventional planning if no better versus switching to formal PT.    ___________________________________________ Gwen Her. Dianah Field, M.D., ABFM., CAQSM. Primary Care and Sports Medicine [facility]  Adjunct Professor of [place] [employer]

## 2019-04-19 ENCOUNTER — Encounter: Payer: Self-pay | Admitting: Sports Medicine

## 2019-05-14 ENCOUNTER — Ambulatory Visit: Payer: Medicare Other | Admitting: Sports Medicine

## 2019-05-17 DIAGNOSIS — Z961 Presence of intraocular lens: Secondary | ICD-10-CM | POA: Diagnosis not present

## 2019-05-17 DIAGNOSIS — H43813 Vitreous degeneration, bilateral: Secondary | ICD-10-CM | POA: Diagnosis not present

## 2019-05-17 DIAGNOSIS — H353131 Nonexudative age-related macular degeneration, bilateral, early dry stage: Secondary | ICD-10-CM | POA: Diagnosis not present

## 2019-05-17 DIAGNOSIS — D3132 Benign neoplasm of left choroid: Secondary | ICD-10-CM | POA: Diagnosis not present

## 2019-05-17 LAB — HM DIABETES EYE EXAM

## 2019-05-19 ENCOUNTER — Ambulatory Visit (INDEPENDENT_AMBULATORY_CARE_PROVIDER_SITE_OTHER): Payer: Medicare Other | Admitting: Sports Medicine

## 2019-05-19 ENCOUNTER — Encounter: Payer: Self-pay | Admitting: Sports Medicine

## 2019-05-19 DIAGNOSIS — M48061 Spinal stenosis, lumbar region without neurogenic claudication: Secondary | ICD-10-CM

## 2019-05-19 NOTE — Assessment & Plan Note (Signed)
L4-L5 central canal stenosis based on a 2008 MRI. Facet arthritis at multiple levels. He did extremely well with prednisone, home physical therapy. We avoided NSAIDs due to Xarelto. He is now pain-free, he has been playing 18 holes of golf every few days, he could be a little bit more diligent with his rehabilitation but is happy with how things are going.

## 2019-05-19 NOTE — Progress Notes (Signed)
Subjective:    CC: Follow-up  HPI: Lumbar spinal stenosis: Symptoms now completely resolved.  I reviewed the past medical history, family history, social history, surgical history, and allergies today and no changes were needed.  Please see the problem list section below in epic for further details.  Past Medical History: Past Medical History:  Diagnosis Date  . Anemia   . Antral gastritis 2013   EGD   . Atrial fibrillation ([place])   . CAD (coronary artery disease)    s/p CABG 1993 (L-LAD, S-RI, S-D1);   echo 1/10: EF 55%;    myoview 12/09: inf MI, no ischemia  . Candida esophagitis ([place]) 2013   EGD   . Cataracts, bilateral   . Family history of malignant neoplasm of gastrointestinal tract   . Folliculitis   . Hiatal hernia   . HTN (hypertension)   . Hyperlipidemia   . Irritable bladder   . Myocardial infarction ([place])   . Obesity   . Osteoarthritis    Past Surgical History: Past Surgical History:  Procedure Laterality Date  . CATARACT EXTRACTION Right 07/21/2018  . CHOLECYSTECTOMY     laparoscopic '92  . CORONARY ARTERY BYPASS GRAFT     graft '92: LIMA-LAD, SVG - D2,R1, D1  . ENTEROSCOPY  08/22/2012   Procedure: ENTEROSCOPY;  Surgeon: Juanita Craver, MD;  Location: [facility];  Service: Endoscopy;  Laterality: N/A;  . INTRAOCULAR LENS EXCHANGE Right 07/21/2018  . KNEE SURGERY    . MOLE REMOVAL  2001 and 2008  . PILONIDAL CYST EXCISION     Social History: Social History   Socioeconomic History  . Marital status: Widowed    Spouse name: Not on file  . Number of children: 1  . Years of education: Not on file  . Highest education level: Not on file  Occupational History  . Occupation: Retired    Fish farm manager: RETIRED  Social Needs  . Financial resource strain: Not on file  . Food insecurity:    Worry: Not on file    Inability: Not on file  . Transportation needs:    Medical: Not on file    Non-medical: Not on file  Tobacco Use  . Smoking status: Former Smoker     Years: 16.00    Last attempt to quit: 08/14/1964    Years since quitting: 54.7  . Smokeless tobacco: Never Used  . Tobacco comment: quit 1965  Substance and Sexual Activity  . Alcohol use: No  . Drug use: No  . Sexual activity: Not on file  Lifestyle  . Physical activity:    Days per week: Not on file    Minutes per session: Not on file  . Stress: Not on file  Relationships  . Social connections:    Talks on phone: Not on file    Gets together: Not on file    Attends religious service: Not on file    Active member of club or organization: Not on file    Attends meetings of clubs or organizations: Not on file    Relationship status: Not on file  Other Topics Concern  . 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 Married '57- widowed 03-08-2023. 1 son -'78- died '88 MVA.    Lives by himself   No immediate family. Emergency contact : Daron Offer [phone] (friend)   Family History: Family History  Problem Relation Age of Onset  . Coronary artery  disease Father        and brother  . Heart attack Father        and brother-fatal  . Stroke Father   . Breast cancer Mother   . Alzheimer's disease Mother   . Colon cancer Maternal Uncle   . Esophageal cancer Neg Hx   . Stomach cancer Neg Hx   . Rectal cancer Neg Hx   . Prostate cancer Neg Hx    Allergies: No Known Allergies Medications: See med rec.  Review of Systems: No fevers, chills, night sweats, weight loss, chest pain, or shortness of breath.   Objective:    General: Well Developed, well nourished, and in no acute distress.  Neuro: Alert and oriented x3, extra-ocular muscles intact, sensation grossly intact.  HEENT: Normocephalic, atraumatic, pupils equal round reactive to light, neck supple, no masses, no lymphadenopathy, thyroid nonpalpable.  Skin: Warm and dry, no rashes. Cardiac: Regular rate and rhythm, no murmurs rubs or gallops, no lower extremity edema.  Respiratory:  Clear to auscultation bilaterally. Not using accessory muscles, speaking in full sentences.   Impression and Recommendations:    Lumbar spinal stenosis L4-L5 central canal stenosis based on a 2008 MRI. Facet arthritis at multiple levels. He did extremely well with prednisone, home physical therapy. We avoided NSAIDs due to Xarelto. He is now pain-free, he has been playing 18 holes of golf every few days, he could be a little bit more diligent with his rehabilitation but is happy with how things are going.   ___________________________________________ Gwen Her. Dianah Field, M.D., ABFM., CAQSM. Primary Care and Sports Medicine [facility]  Adjunct Professor of [place] [employer]

## 2019-06-01 ENCOUNTER — Other Ambulatory Visit: Payer: Self-pay | Admitting: Internal Medicine

## 2019-07-20 ENCOUNTER — Telehealth: Payer: Self-pay

## 2019-07-20 NOTE — Telephone Encounter (Signed)
Called pt to schedule overdue f/u s/w Friend, Debbrah Alar  She states to [facility] tomorrow to schedule appt, will discuss w/pt

## 2019-07-27 ENCOUNTER — Other Ambulatory Visit: Payer: Self-pay

## 2019-07-27 ENCOUNTER — Other Ambulatory Visit: Payer: Self-pay | Admitting: Cardiology

## 2019-07-27 ENCOUNTER — Encounter: Payer: Self-pay | Admitting: Internal Medicine

## 2019-07-27 DIAGNOSIS — I4891 Unspecified atrial fibrillation: Secondary | ICD-10-CM

## 2019-07-27 NOTE — Telephone Encounter (Signed)
73m 97.5kg Scr  1.53 02/24/19 ccr 49 mlmin Lovw/crenshaw 09/30/18 Pt requesting 20mg  xarelto but only qualifies for 15mg  with the ccr of 49 will need a pharmd review

## 2019-07-27 NOTE — Telephone Encounter (Signed)
Please review for refill. Thank you! 

## 2019-07-28 ENCOUNTER — Encounter: Payer: Self-pay | Admitting: Internal Medicine

## 2019-07-28 ENCOUNTER — Ambulatory Visit (INDEPENDENT_AMBULATORY_CARE_PROVIDER_SITE_OTHER): Payer: Medicare Other | Admitting: Internal Medicine

## 2019-07-28 VITALS — BP 152/78 | HR 59 | Temp 97.0°F | Resp 18 | Ht 70.0 in | Wt 218.4 lb

## 2019-07-28 DIAGNOSIS — E1159 Type 2 diabetes mellitus with other circulatory complications: Secondary | ICD-10-CM | POA: Diagnosis not present

## 2019-07-28 DIAGNOSIS — I4821 Permanent atrial fibrillation: Secondary | ICD-10-CM

## 2019-07-28 DIAGNOSIS — I4891 Unspecified atrial fibrillation: Secondary | ICD-10-CM | POA: Diagnosis not present

## 2019-07-28 DIAGNOSIS — Z0000 Encounter for general adult medical examination without abnormal findings: Secondary | ICD-10-CM

## 2019-07-28 LAB — BASIC METABOLIC PANEL
BUN: 24 mg/dL — ABNORMAL HIGH (ref 6–23)
CO2: 27 mEq/L (ref 19–32)
Calcium: 9.5 mg/dL (ref 8.4–10.5)
Chloride: 103 mEq/L (ref 96–112)
Creatinine, Ser: 1.62 mg/dL — ABNORMAL HIGH (ref 0.40–1.50)
GFR: 40.63 mL/min — ABNORMAL LOW (ref >60.00)
Glucose, Bld: 151 mg/dL — ABNORMAL HIGH (ref 70–99)
Potassium: 4.4 mEq/L (ref 3.5–5.1)
Sodium: 139 mEq/L (ref 135–145)

## 2019-07-28 LAB — CBC WITH DIFFERENTIAL/PLATELET
Basophils Absolute: 0 10*3/uL (ref 0.0–0.1)
Basophils Relative: 0.5 % (ref 0.0–3.0)
Eosinophils Absolute: 0.2 10*3/uL (ref 0.0–0.7)
Eosinophils Relative: 3.1 % (ref 0.0–5.0)
HCT: 42.4 % (ref 39.0–52.0)
Hemoglobin: 14.5 g/dL (ref 13.0–17.0)
Lymphocytes Relative: 32.2 % (ref 12.0–46.0)
Lymphs Abs: 1.9 10*3/uL (ref 0.7–4.0)
MCHC: 34.2 g/dL (ref 30.0–36.0)
MCV: 96.4 fl (ref 78.0–100.0)
Monocytes Absolute: 0.5 10*3/uL (ref 0.1–1.0)
Monocytes Relative: 9.2 % (ref 3.0–12.0)
Neutro Abs: 3.2 10*3/uL (ref 1.4–7.7)
Neutrophils Relative %: 55 % (ref 43.0–77.0)
Platelets: 178 10*3/uL (ref 150.0–400.0)
RBC: 4.4 Mil/uL (ref 4.22–5.81)
RDW: 14.2 % (ref 11.5–15.5)
WBC: 5.8 10*3/uL (ref 4.0–10.5)

## 2019-07-28 LAB — HEMOGLOBIN A1C: Hgb A1c MFr Bld: 6.9 % — ABNORMAL HIGH (ref 4.6–6.5)

## 2019-07-28 LAB — TSH: TSH: 3.95 u[IU]/mL (ref 0.35–4.50)

## 2019-07-28 MED ORDER — RIVAROXABAN 15 MG PO TABS: 15.0000 mg | ORAL_TABLET | Freq: Every day | ORAL | 3 refills | Status: DC

## 2019-07-28 MED ORDER — FUROSEMIDE 40 MG PO TABS: 40.0000 mg | ORAL_TABLET | Freq: Every day | ORAL | 1 refills | Status: DC

## 2019-07-28 MED ORDER — METOPROLOL SUCCINATE ER 25 MG PO TB24: 25.0000 mg | ORAL_TABLET | Freq: Every day | ORAL | 1 refills | Status: DC

## 2019-07-28 MED ORDER — BENAZEPRIL HCL 10 MG PO TABS: 10.0000 mg | ORAL_TABLET | Freq: Every day | ORAL | 1 refills | Status: DC

## 2019-07-28 NOTE — Patient Instructions (Addendum)
Please schedule Medicare Wellness with Glenard Haring.   GO TO THE LAB : Get the blood work     GO TO THE FRONT DESK Schedule your next appointment for a check up in 6 months     Schedule a flu shot for next month; bring the blood pressure readings then  For pain: ICE, tylenol    Check the  blood pressure 2 or 3 times a week BP GOAL is between 110/65 and  135/85. If it is consistently higher or lower, let me know   Decrease pantoprazole to one tablet Monday-Wednesday-Friday

## 2019-07-28 NOTE — Progress Notes (Signed)
Pre visit review using our clinic review tool, if applicable. No additional management support is needed unless otherwise documented below in the visit note. 

## 2019-07-28 NOTE — Progress Notes (Signed)
Subjective:    Patient ID: [name], male    DOB: 06-24-1931, 85 y.o.   MRN: [id number]  DOS:  07/28/2019 Type of visit - description: CPX In general feeling well.  No major concerns. Still active, playing golf 3 times a week. Occasionally aches and pains particularly at the ankles. Specifically denies chest pain, difficulty breathing. L UTS controlled   BP Readings from Last 3 Encounters:  07/28/19 (!) 152/78  05/19/19 117/66  04/16/19 (!) 165/69     Review of Systems  Other than above, a 14 point review of systems is negative     Past Medical History:  Diagnosis Date  . Anemia   . Antral gastritis 2013   EGD   . Atrial fibrillation ([place])   . CAD (coronary artery disease)    s/p CABG 1993 (L-LAD, S-RI, S-D1);   echo 1/10: EF 55%;    myoview 12/09: inf MI, no ischemia  . Candida esophagitis ([place]) 2013   EGD   . Cataracts, bilateral   . Family history of malignant neoplasm of gastrointestinal tract   . Folliculitis   . Hiatal hernia   . HTN (hypertension)   . Hyperlipidemia   . Irritable bladder   . Myocardial infarction ([place])   . Obesity   . Osteoarthritis     Past Surgical History:  Procedure Laterality Date  . CATARACT EXTRACTION Right 07/21/2018  . CHOLECYSTECTOMY     laparoscopic '92  . CORONARY ARTERY BYPASS GRAFT     graft '92: LIMA-LAD, SVG - D2,R1, D1  . ENTEROSCOPY  08/22/2012   Procedure: ENTEROSCOPY;  Surgeon: Juanita Craver, MD;  Location: [facility];  Service: Endoscopy;  Laterality: N/A;  . INTRAOCULAR LENS EXCHANGE Right 07/21/2018  . KNEE SURGERY    . MOLE REMOVAL  2001 and 2008  . PILONIDAL CYST EXCISION      Social History   Socioeconomic History  . Marital status: Widowed    Spouse name: Not on file  . Number of children: 1  . Years of education: Not on file  . Highest education level: Not on file  Occupational History  . Occupation: Retired    Fish farm manager: RETIRED  Social Needs  . Financial resource strain: Not on file  .  Food insecurity    Worry: Not on file    Inability: Not on file  . Transportation needs    Medical: Not on file    Non-medical: Not on file  Tobacco Use  . Smoking status: Former Smoker    Years: 16.00    Quit date: 08/14/1964    Years since quitting: 54.9  . Smokeless tobacco: Never Used  . Tobacco comment: quit 1965  Substance and Sexual Activity  . Alcohol use: No  . Drug use: No  . Sexual activity: Not on file  Lifestyle  . Physical activity    Days per week: Not on file    Minutes per session: Not on file  . Stress: Not on file  Relationships  . Social Herbalist on phone: Not on file    Gets together: Not on file    Attends religious service: Not on file    Active member of club or organization: Not on file    Attends meetings of clubs or organizations: Not on file    Relationship status: Not on file  . Intimate partner violence    Fear of current or ex partner: Not on file    Emotionally abused:  Not on file    Physically abused: Not on file    Forced sexual activity: Not on file  Other Topics Concern  . 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 Married '57- widowed Feb 22, 2023. 1 son -'67- died '21 MVA.    Lives by himself    Emergency contact: see DPR, brother and  Daron Offer [phone] (friend)     Family History  Problem Relation Age of Onset  . Coronary artery disease Father        and brother  . Heart attack Father        and brother-fatal  . Stroke Father   . Breast cancer Mother   . Alzheimer's disease Mother   . Colon cancer Maternal Uncle   . Esophageal cancer Neg Hx   . Stomach cancer Neg Hx   . Rectal cancer Neg Hx   . Prostate cancer Neg Hx      Allergies as of 07/28/2019   No Known Allergies     Medication List       Accurate as of July 28, 2019 11:59 PM. If you have any questions, ask your nurse or doctor.        atorvastatin 80 MG tablet Commonly known as: LIPITOR TAKE 1 TABLET BY  MOUTH EVERY DAY   benazepril 10 MG tablet Commonly known as: LOTENSIN Take 1 tablet (10 mg total) by mouth daily.   COQ10 PO Take 1 capsule by mouth daily.   diphenhydramine-acetaminophen 25-500 MG Tabs tablet Commonly known as: TYLENOL PM Take 2 tablets by mouth at bedtime.   furosemide 40 MG tablet Commonly known as: LASIX Take 1 tablet (40 mg total) by mouth daily. What changed: See the new instructions. Changed by: Kathlene November, MD   Melatonin 5 MG Tabs Take 5 mg by mouth at bedtime.   metoprolol succinate 25 MG 24 hr tablet Commonly known as: TOPROL-XL Take 1 tablet (25 mg total) by mouth daily.   multivitamin with minerals Tabs tablet Take 1 tablet by mouth daily.   nitroGLYCERIN 0.4 MG SL tablet Commonly known as: Nitrostat Place 1 tablet (0.4 mg total) under the tongue every 5 (five) minutes as needed for chest pain.   pantoprazole 40 MG tablet Commonly known as: PROTONIX Take 1 tablet (40 mg total) by mouth daily.   Rivaroxaban 15 MG Tabs tablet Commonly known as: XARELTO Take 1 tablet (15 mg total) by mouth daily with supper.   tamsulosin 0.4 MG Caps capsule Commonly known as: FLOMAX Take 1 capsule (0.4 mg total) by mouth daily.   vitamin B-12 1000 MCG tablet Commonly known as: CYANOCOBALAMIN Take 1,000 mcg by mouth daily.   Vitamin D 1000 units capsule Take 1,000 Units by mouth daily.           Objective:   Physical Exam BP (!) 152/78 (BP Location: Left Arm, Patient Position: Sitting, Cuff Size: Small)   Pulse (!) 59   Temp (!) 97 F (36.1 C) (Temporal)   Resp 18   Ht 5\' 10"  (1.778 m)   Wt 218 lb 6 oz (99.1 kg)   SpO2 98%   BMI 31.33 kg/m  General: Well developed, NAD, BMI noted Neck: No  thyromegaly  HEENT:  Normocephalic . Face symmetric, atraumatic Lungs:  CTA B Normal respiratory effort, no intercostal retractions, no accessory muscle use. Heart: Bradycardic, irregular   No pretibial edema bilaterally  Abdomen:  Not distended,  soft, non-tender. No rebound or rigidity.   Skin: Exposed areas without rash. Not pale. Not jaundice Neurologic:  alert & oriented X3.  Speech normal, gait appropriate for age and unassisted Strength symmetric and appropriate for age.  Psych: Cognition and judgment appear intact.  Cooperative with normal attention span and concentration.  Behavior appropriate. No anxious or depressed appearing.     Assessment     Assessment  DM --diet control, no neuropathy HTN Hyperlipidemia CRI:  CV: --CAD, CABG 1993, Myoview 2009 no ischemia. --Atrial fibrillation -- rate control, xarelto -- chronic combined systolic/diastolic congestive heart failure Venous insufficiency, mild LE edema L>>R  LUTS:  DRE-PSA wnl 07-2016 MSK: DJD, spinal stenosis, R wrist Fx in the 80s GI: --Recurrent melena: Work-up 2013 Dr. Sharlett Iles: Colonoscopy, EGD, capsule endoscopy.  Felt to be due to NSAIDs --Candida esophagitis, antral gastritis --->  2013 per EGD --HH Sees derm x 2/year  PLAN: DM: Check A1c, diet control HTN: Currently on Lotensin, Toprol, Lasix.  BP today in the 150s, at home it was elevated a couple weeks ago in the 150s.  D/w pt add a medication versus monitor BP closely and then decide if an adjustment of medication is needed.  Elected to monitor closely, see AVS. Consider restart amlodipine. Atrial fibrillation: Rate controlled, on Xarelto, RF meds as needed Dyspepsia: On daily Protonix, no GI symptoms, recommend to decrease Protonix to 3 times a week.  Consider stop PPIs. RTC Flu shot next month Follow-up 6 months

## 2019-07-29 NOTE — Assessment & Plan Note (Signed)
--  Td 2013 - PNM 23: 2013 - prevnar 2015  - zostavax 2016 - s/p shingrex  - flu shot recommended --Cscope 02-2012 (-), no further screen per GI letter  --Prostate cancer screening: no further screening.  On 11-2018, DRE was performed, a small nodule noted, PSA was 2.7.  No further evaluation needed at this point --  Labs:   BMP, A1c, CBC, TSH

## 2019-07-29 NOTE — Assessment & Plan Note (Signed)
DM: Check A1c, diet control HTN: Currently on Lotensin, Toprol, Lasix.  BP today in the 150s, at home it was elevated a couple weeks ago in the 150s.  D/w pt add a medication versus monitor BP closely and then decide if an adjustment of medication is needed.  Elected to monitor closely, see AVS. Consider restart amlodipine. Atrial fibrillation: Rate controlled, on Xarelto, RF meds as needed Dyspepsia: On daily Protonix, no GI symptoms, recommend to decrease Protonix to 3 times a week.  Consider stop PPIs. RTC Flu shot next month Follow-up 6 months

## 2019-08-25 ENCOUNTER — Encounter: Payer: Self-pay | Admitting: Sports Medicine

## 2019-08-25 ENCOUNTER — Ambulatory Visit (INDEPENDENT_AMBULATORY_CARE_PROVIDER_SITE_OTHER): Payer: Medicare Other | Admitting: Sports Medicine

## 2019-08-25 DIAGNOSIS — M48061 Spinal stenosis, lumbar region without neurogenic claudication: Secondary | ICD-10-CM | POA: Diagnosis not present

## 2019-08-25 MED ORDER — PREDNISONE 50 MG PO TABS: ORAL_TABLET | ORAL | 0 refills | Status: DC

## 2019-08-25 NOTE — Patient Instructions (Signed)
Spinal Stenosis  Spinal stenosis occurs when the open space (spinal canal) between the bones of your spine (vertebrae) narrows, putting pressure on the spinal cord or nerves. What are the causes? This condition is caused by areas of bone pushing into the central canals of your vertebrae. This condition may be present at birth (congenital), or it may be caused by:  Arthritic deterioration of your vertebrae (spinal degeneration). This usually starts around age 50.  Injury or trauma to the spine.  Tumors in the spine.  Calcium deposits in the spine. What are the signs or symptoms? Symptoms of this condition include:  Pain in the neck or back that is generally worse with activities, particularly when standing and walking.  Numbness, tingling, hot or cold sensations, weakness, or weariness in your legs.  Pain going up and down the leg (sciatica).  Frequent episodes of falling.  A foot-slapping gait that leads to muscle weakness. In more serious cases, you may develop:  Problemspassing stool or passing urine.  Difficulty having sex.  Loss of feeling in part or all of your leg. Symptoms may come on slowly and get worse over time. How is this diagnosed? This condition is diagnosed based on your medical history and a physical exam. Tests will also be done, such as:  MRI.  CT scan.  X-ray. How is this treated? Treatment for this condition often focuses on managing your pain and any other symptoms. Treatment may include:  Practicing good posture to lessen pressure on your nerves.  Exercising to strengthen muscles, build endurance, improve balance, and maintain good joint movement (range of motion).  Losing weight, if needed.  Taking medicines to reduce swelling, inflammation, or pain.  Assistive devices, such as a corset or brace. In some cases, surgery may be needed. The most common procedure is decompression laminectomy. This is done to remove excess bone that puts  pressure on your nerve roots. Follow these instructions at home: Managing pain, stiffness, and swelling  Do all exercises and stretches as told by your health care provider.  Practice good posture. If you were given a brace or a corset, wear it as told by your health care provider.  Do not do any activities that cause pain. Ask your health care provider what activities are safe for you.  Do not lift anything that is heavier than 10 lb (4.5 kg) or the limit that your health care provider tells you.  Maintain a healthy weight. Talk with your health care provider if you need help losing weight.  If directed, apply heat to the affected area as often as told by your health care provider. Use the heat source that your health care provider recommends, such as a moist heat pack or a heating pad. ? Place a towel between your skin and the heat source. ? Leave the heat on for 20-30 minutes. ? Remove the heat if your skin turns bright red. This is especially important if you are not able to feel pain, heat, or cold. You may have a greater risk of getting burned. General instructions  Take over-the-counter and prescription medicines only as told by your health care provider.  Do not use any products that contain nicotine or tobacco, such as cigarettes and e-cigarettes. If you need help quitting, ask your health care provider.  Eat a healthy diet. This includes plenty of fruits and vegetables, whole grains, and low-fat (lean) protein.  Keep all follow-up visits as told by your health care provider. This is important. Contact   a health care provider if:  Your symptoms do not get better or they get worse.  You have a fever. Get help right away if:  You have new or worse pain in your neck or upper back.  You have severe pain that cannot be controlled with medicines.  You are dizzy.  You have vision problems, blurred vision, or double vision.  You have a severe headache that is worse when you  stand.  You have nausea or you vomit.  You develop new or worse numbness or tingling in your back or legs.  You have pain, redness, swelling, or warmth in your arm or leg. Summary  Spinal stenosis occurs when the open space (spinal canal) between the bones of your spine (vertebrae) narrows. This narrowing puts pressure on the spinal cord or nerves.  Spinal stenosis can cause numbness, weakness, or pain in the neck, back, and legs.  This condition may be caused by a birth defect, arthritic deterioration of your vertebrae, injury, tumors, or calcium deposits.  This condition is usually diagnosed with MRIs, CT scans, and X-rays. This information is not intended to replace advice given to you by your health care provider. Make sure you discuss any questions you have with your health care provider. Document Released: 02/15/2004 Document Revised: 11/07/2017 Document Reviewed: 10/30/2016 Elsevier Patient Education  2020 [employer] Inc.  

## 2019-08-25 NOTE — Assessment & Plan Note (Signed)
Recurrence of pain, we are 4 months post his last burst of prednisone. We are avoiding NSAIDs due to Xarelto. Repeat burst of prednisone, home rehab exercises given. An MRI from 2008 showed L4-5 central canal stenosis and facet arthritis at multiple levels. If he does not have a good improvement we will proceed with a new MRI and likely an epidural.

## 2019-08-25 NOTE — Progress Notes (Signed)
Subjective:    CC: Low back pain  HPI: [name] returns, he is a pleasant 85 year old male with symptoms consistent with lumbar spinal stenosis, his last advanced imaging study was in 2008, which did show L4-L5 moderate central canal spinal stenosis.  He responded well to conservative treatment including home rehab as well as a burst of prednisone about 4 to 5 months ago.  Now having recurrence of pain, left-sided low back with radiation to the left lower leg, no progressive weakness, bowel or bladder dysfunction, saddle numbness, constitutional symptoms, no trauma.  I reviewed the past medical history, family history, social history, surgical history, and allergies today and no changes were needed.  Please see the problem list section below in epic for further details.  Past Medical History: Past Medical History:  Diagnosis Date  . Anemia   . Antral gastritis 2013   EGD   . Atrial fibrillation ([place])   . CAD (coronary artery disease)    s/p CABG 1993 (L-LAD, S-RI, S-D1);   echo 1/10: EF 55%;    myoview 12/09: inf MI, no ischemia  . Candida esophagitis ([place]) 2013   EGD   . Cataracts, bilateral   . Family history of malignant neoplasm of gastrointestinal tract   . Folliculitis   . Hiatal hernia   . HTN (hypertension)   . Hyperlipidemia   . Irritable bladder   . Myocardial infarction ([place])   . Obesity   . Osteoarthritis    Past Surgical History: Past Surgical History:  Procedure Laterality Date  . CATARACT EXTRACTION Right 07/21/2018  . CHOLECYSTECTOMY     laparoscopic '92  . CORONARY ARTERY BYPASS GRAFT     graft '92: LIMA-LAD, SVG - D2,R1, D1  . ENTEROSCOPY  08/22/2012   Procedure: ENTEROSCOPY;  Surgeon: Juanita Craver, MD;  Location: [facility];  Service: Endoscopy;  Laterality: N/A;  . INTRAOCULAR LENS EXCHANGE Right 07/21/2018  . KNEE SURGERY    . MOLE REMOVAL  2001 and 2008  . PILONIDAL CYST EXCISION     Social History: Social History   Socioeconomic History  .  Marital status: Widowed    Spouse name: Not on file  . Number of children: 1  . Years of education: Not on file  . Highest education level: Not on file  Occupational History  . Occupation: Retired    Fish farm manager: RETIRED  Social Needs  . Financial resource strain: Not on file  . Food insecurity    Worry: Not on file    Inability: Not on file  . Transportation needs    Medical: Not on file    Non-medical: Not on file  Tobacco Use  . Smoking status: Former Smoker    Years: 16.00    Quit date: 08/14/1964    Years since quitting: 55.0  . Smokeless tobacco: Never Used  . Tobacco comment: quit 1965  Substance and Sexual Activity  . Alcohol use: No  . Drug use: No  . Sexual activity: Not on file  Lifestyle  . Physical activity    Days per week: Not on file    Minutes per session: Not on file  . Stress: Not on file  Relationships  . Social Herbalist on phone: Not on file    Gets together: Not on file    Attends religious service: Not on file    Active member of club or organization: Not on file    Attends meetings of clubs or organizations: Not on file  Relationship status: Not on file  Other Topics Concern  . 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 Married '57- widowed Feb 17, 2023. 1 son -'27- died '28 MVA.    Lives by himself    Emergency contact: see DPR, brother and  Daron Offer [phone] (friend)   Family History: Family History  Problem Relation Age of Onset  . Coronary artery disease Father        and brother  . Heart attack Father        and brother-fatal  . Stroke Father   . Breast cancer Mother   . Alzheimer's disease Mother   . Colon cancer Maternal Uncle   . Esophageal cancer Neg Hx   . Stomach cancer Neg Hx   . Rectal cancer Neg Hx   . Prostate cancer Neg Hx    Allergies: No Known Allergies Medications: See med rec.  Review of Systems: No fevers, chills, night sweats, weight loss, chest pain, or  shortness of breath.   Objective:    General: Well Developed, well nourished, and in no acute distress.  Neuro: Alert and oriented x3, extra-ocular muscles intact, sensation grossly intact.  HEENT: Normocephalic, atraumatic, pupils equal round reactive to light, neck supple, no masses, no lymphadenopathy, thyroid nonpalpable.  Skin: Warm and dry, no rashes. Cardiac: Regular rate and rhythm, no murmurs rubs or gallops, no lower extremity edema.  Respiratory: Clear to auscultation bilaterally. Not using accessory muscles, speaking in full sentences.  Impression and Recommendations:    Lumbar spinal stenosis Recurrence of pain, we are 4 months post his last burst of prednisone. We are avoiding NSAIDs due to Xarelto. Repeat burst of prednisone, home rehab exercises given. An MRI from 2008 showed L4-5 central canal stenosis and facet arthritis at multiple levels. If he does not have a good improvement we will proceed with a new MRI and likely an epidural.   ___________________________________________ Gwen Her. Dianah Field, M.D., ABFM., CAQSM. Primary Care and Sports Medicine [facility]  Adjunct Professor of [employer]

## 2019-08-27 DIAGNOSIS — M545 Low back pain: Secondary | ICD-10-CM | POA: Diagnosis not present

## 2019-08-27 DIAGNOSIS — M461 Sacroiliitis, not elsewhere classified: Secondary | ICD-10-CM | POA: Diagnosis not present

## 2019-08-27 DIAGNOSIS — M9905 Segmental and somatic dysfunction of pelvic region: Secondary | ICD-10-CM | POA: Diagnosis not present

## 2019-08-27 DIAGNOSIS — M9903 Segmental and somatic dysfunction of lumbar region: Secondary | ICD-10-CM | POA: Diagnosis not present

## 2019-08-27 DIAGNOSIS — M25552 Pain in left hip: Secondary | ICD-10-CM | POA: Diagnosis not present

## 2019-08-30 DIAGNOSIS — M461 Sacroiliitis, not elsewhere classified: Secondary | ICD-10-CM | POA: Diagnosis not present

## 2019-08-30 DIAGNOSIS — M545 Low back pain: Secondary | ICD-10-CM | POA: Diagnosis not present

## 2019-08-30 DIAGNOSIS — M9903 Segmental and somatic dysfunction of lumbar region: Secondary | ICD-10-CM | POA: Diagnosis not present

## 2019-08-30 DIAGNOSIS — M9905 Segmental and somatic dysfunction of pelvic region: Secondary | ICD-10-CM | POA: Diagnosis not present

## 2019-08-31 ENCOUNTER — Other Ambulatory Visit: Payer: Self-pay

## 2019-08-31 MED ORDER — ATORVASTATIN CALCIUM 80 MG PO TABS: 80.0000 mg | ORAL_TABLET | Freq: Every day | ORAL | 2 refills | Status: DC

## 2019-09-01 DIAGNOSIS — M9903 Segmental and somatic dysfunction of lumbar region: Secondary | ICD-10-CM | POA: Diagnosis not present

## 2019-09-01 DIAGNOSIS — M9905 Segmental and somatic dysfunction of pelvic region: Secondary | ICD-10-CM | POA: Diagnosis not present

## 2019-09-01 DIAGNOSIS — M545 Low back pain: Secondary | ICD-10-CM | POA: Diagnosis not present

## 2019-09-01 DIAGNOSIS — M461 Sacroiliitis, not elsewhere classified: Secondary | ICD-10-CM | POA: Diagnosis not present

## 2019-09-01 DIAGNOSIS — M25552 Pain in left hip: Secondary | ICD-10-CM | POA: Diagnosis not present

## 2019-09-03 DIAGNOSIS — M9903 Segmental and somatic dysfunction of lumbar region: Secondary | ICD-10-CM | POA: Diagnosis not present

## 2019-09-03 DIAGNOSIS — M461 Sacroiliitis, not elsewhere classified: Secondary | ICD-10-CM | POA: Diagnosis not present

## 2019-09-03 DIAGNOSIS — M9905 Segmental and somatic dysfunction of pelvic region: Secondary | ICD-10-CM | POA: Diagnosis not present

## 2019-09-06 ENCOUNTER — Telehealth: Payer: Self-pay

## 2019-09-06 DIAGNOSIS — M461 Sacroiliitis, not elsewhere classified: Secondary | ICD-10-CM | POA: Diagnosis not present

## 2019-09-06 DIAGNOSIS — M9903 Segmental and somatic dysfunction of lumbar region: Secondary | ICD-10-CM | POA: Diagnosis not present

## 2019-09-06 DIAGNOSIS — M25552 Pain in left hip: Secondary | ICD-10-CM | POA: Diagnosis not present

## 2019-09-06 DIAGNOSIS — M545 Low back pain: Secondary | ICD-10-CM | POA: Diagnosis not present

## 2019-09-06 DIAGNOSIS — M9905 Segmental and somatic dysfunction of pelvic region: Secondary | ICD-10-CM | POA: Diagnosis not present

## 2019-09-06 NOTE — Telephone Encounter (Signed)
Patient called after hour line and stated that he is having back spasms. Requesting RX be sent in to hold him over until upcoming appt on 09/22/19. Per note, spasms started on the morning of 09/05/19 and wake him from his sleep.   Do you want patient to call and schedule appt for sooner, or OK to send medication.

## 2019-09-06 NOTE — Telephone Encounter (Signed)
Left msg to call back and let us know if OK to proceed with MRI/Epidural

## 2019-09-06 NOTE — Telephone Encounter (Signed)
Next step is an MRI and epidural.  Would he like to proceed?

## 2019-09-07 DIAGNOSIS — M545 Low back pain: Secondary | ICD-10-CM | POA: Diagnosis not present

## 2019-09-07 DIAGNOSIS — M461 Sacroiliitis, not elsewhere classified: Secondary | ICD-10-CM | POA: Diagnosis not present

## 2019-09-07 DIAGNOSIS — M9903 Segmental and somatic dysfunction of lumbar region: Secondary | ICD-10-CM | POA: Diagnosis not present

## 2019-09-07 DIAGNOSIS — M9905 Segmental and somatic dysfunction of pelvic region: Secondary | ICD-10-CM | POA: Diagnosis not present

## 2019-09-07 DIAGNOSIS — M25552 Pain in left hip: Secondary | ICD-10-CM | POA: Diagnosis not present

## 2019-09-08 DIAGNOSIS — M461 Sacroiliitis, not elsewhere classified: Secondary | ICD-10-CM | POA: Diagnosis not present

## 2019-09-08 DIAGNOSIS — M9905 Segmental and somatic dysfunction of pelvic region: Secondary | ICD-10-CM | POA: Diagnosis not present

## 2019-09-08 DIAGNOSIS — M9903 Segmental and somatic dysfunction of lumbar region: Secondary | ICD-10-CM | POA: Diagnosis not present

## 2019-09-09 ENCOUNTER — Other Ambulatory Visit: Payer: Self-pay

## 2019-09-09 ENCOUNTER — Encounter: Payer: Self-pay | Admitting: Family Medicine

## 2019-09-09 ENCOUNTER — Ambulatory Visit (INDEPENDENT_AMBULATORY_CARE_PROVIDER_SITE_OTHER): Payer: Medicare Other | Admitting: Family Medicine

## 2019-09-09 VITALS — BP 147/73 | HR 64 | Temp 97.9°F | Wt 219.0 lb

## 2019-09-09 DIAGNOSIS — M7062 Trochanteric bursitis, left hip: Secondary | ICD-10-CM

## 2019-09-09 MED ORDER — DICLOFENAC SODIUM 1 % TD GEL: 4.0000 g | Freq: Four times a day (QID) | TRANSDERMAL | 11 refills | Status: DC

## 2019-09-09 NOTE — Progress Notes (Signed)
[name] is a 85 y.o. male who presents to [facility] today for left lateral hip pain.  Patient has been seen several times by my partner Dr. Dianah Field for back pain thought to be related to DDD and spinal stenosis.  He notes his back has been feeling better recently following visit on September 16.  However he notes pain now in the left lateral hip and buttocks.  This is worse when he stands from seat position ambulates or lays on his left side.  He denies any pain radiating down his leg.  He denies any injury.  No fevers chills nausea vomiting or diarrhea.  Symptoms are moderate.  He has tried ice and heat which help a little. ROS:  As above  Exam:  BP (!) 147/73   Pulse 64   Temp 97.9 F (36.6 C) (Oral)   Wt 219 lb (99.3 kg)   BMI 31.42 kg/m  Wt Readings from Last 5 Encounters:  09/09/19 219 lb (99.3 kg)  08/25/19 220 lb (99.8 kg)  07/28/19 218 lb 6 oz (99.1 kg)  05/19/19 215 lb (97.5 kg)  04/16/19 215 lb (97.5 kg)   General: Well Developed, well nourished, and in no acute distress.  Neuro/Psych: Alert and oriented x3, extra-ocular muscles intact, able to move all 4 extremities, sensation grossly intact. Skin: Warm and dry, no rashes noted.  Respiratory: Not using accessory muscles, speaking in full sentences, trachea midline.  Cardiovascular: Pulses palpable, no extremity edema. Abdomen: Does not appear distended. MSK:  Left hip normal-appearing normal motion. Tender palpation greater trochanter insertion of gluteus medius tendon. Hip abduction strength is diminished 4/5. Mild antalgic gait.     Lab and Radiology Results EXAM: LUMBAR SPINE - COMPLETE 4+ VIEW  COMPARISON:  CT abdomen pelvis dated 01/08/2008.  FINDINGS: Phleboliths project over the patient's pelvis. Surgical clips are noted in the right upper quadrant. Calcifications are noted in the left upper quadrant and are favored to be vascular in origin.  Mild multilevel disc height loss is noted, greatest at the L5-S1 level. There is moderate facet arthrosis at the lower lumbar segments, greatest at the L4-L5 and L5-S1 levels. Mild to moderate multilevel osseous neural foraminal narrowing is noted, greatest at the lower lumbar segments. Atherosclerotic calcifications are noted of the abdominal aorta. There are few calcifications projecting over both kidneys which may represent nephroliths versus vascular calcifications.  IMPRESSION: 1. No acute osseous abnormality. 2. Multilevel degenerative changes as detailed above.   Electronically Signed   By: Constance Holster M.D.   On: 04/16/2019 13:32 I personally (independently) visualized and performed the interpretation of the images attached in this note.     Assessment and Plan: 84 y.o. male with left lateral hip pain.  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  Plan for physical therapy and home exercise program.  Taught side leg raises and standing IT band stretch.  We will also use diclofenac gel for pain control.  Recheck if not improving.  Follow back up with Dr. Darene Lamer if back pain or radicular pain returns.  Neck step at that point would be MRI likely.   PDMP not reviewed this encounter. Orders Placed This Encounter  Procedures  . Ambulatory referral to Physical Therapy    Referral Priority:   Routine    Referral Type:   Physical Medicine    Referral Reason:   Specialty Services Required    Requested Specialty:   Physical Therapy  Number of Visits Requested:   1   Meds ordered this encounter  Medications  . diclofenac sodium (VOLTAREN) 1 % GEL    Sig: Apply 4 g topically 4 (four) times daily. To affected joint.    Dispense:  100 g    Refill:  11    Historical information moved to improve visibility of documentation.  Past Medical History:  Diagnosis Date  . Anemia   . Antral gastritis 2013   EGD   . Atrial fibrillation ([place])    . CAD (coronary artery disease)    s/p CABG 1993 (L-LAD, S-RI, S-D1);   echo 1/10: EF 55%;    myoview 12/09: inf MI, no ischemia  . Candida esophagitis ([place]) 2013   EGD   . Cataracts, bilateral   . Family history of malignant neoplasm of gastrointestinal tract   . Folliculitis   . Hiatal hernia   . HTN (hypertension)   . Hyperlipidemia   . Irritable bladder   . Myocardial infarction ([place])   . Obesity   . Osteoarthritis    Past Surgical History:  Procedure Laterality Date  . CATARACT EXTRACTION Right 07/21/2018  . CHOLECYSTECTOMY     laparoscopic '92  . CORONARY ARTERY BYPASS GRAFT     graft '92: LIMA-LAD, SVG - D2,R1, D1  . ENTEROSCOPY  08/22/2012   Procedure: ENTEROSCOPY;  Surgeon: Juanita Craver, MD;  Location: [facility];  Service: Endoscopy;  Laterality: N/A;  . INTRAOCULAR LENS EXCHANGE Right 07/21/2018  . KNEE SURGERY    . MOLE REMOVAL  2001 and 2008  . PILONIDAL CYST EXCISION     Social History   Tobacco Use  . Smoking status: Former Smoker    Years: 16.00    Quit date: 08/14/1964    Years since quitting: 55.1  . Smokeless tobacco: Never Used  . Tobacco comment: quit 1965  Substance Use Topics  . Alcohol use: No   family history includes Alzheimer's disease in his mother; Breast cancer in his mother; Colon cancer in his maternal uncle; Coronary artery disease in his father; Heart attack in his father; Stroke in his father.  Medications: Current Outpatient Medications  Medication Sig Dispense Refill  . atorvastatin (LIPITOR) 80 MG tablet Take 1 tablet (80 mg total) by mouth daily. 90 tablet 2  . benazepril (LOTENSIN) 10 MG tablet Take 1 tablet (10 mg total) by mouth daily. 90 tablet 1  . Cholecalciferol (VITAMIN D) 1000 UNITS capsule Take 1,000 Units by mouth daily.      . Coenzyme Q10 (COQ10 PO) Take 1 capsule by mouth daily.    . diclofenac sodium (VOLTAREN) 1 % GEL Apply 4 g topically 4 (four) times daily. To affected joint. 100 g 11  .  diphenhydramine-acetaminophen (TYLENOL PM) 25-500 MG TABS tablet Take 2 tablets by mouth at bedtime.    . furosemide (LASIX) 40 MG tablet Take 1 tablet (40 mg total) by mouth daily. 90 tablet 1  . Melatonin 5 MG TABS Take 5 mg by mouth at bedtime.    . metoprolol succinate (TOPROL-XL) 25 MG 24 hr tablet Take 1 tablet (25 mg total) by mouth daily. 90 tablet 1  . Multiple Vitamin (MULTIVITAMIN WITH MINERALS) TABS tablet Take 1 tablet by mouth daily.    . nitroGLYCERIN (NITROSTAT) 0.4 MG SL tablet Place 1 tablet (0.4 mg total) under the tongue every 5 (five) minutes as needed for chest pain. (Patient not taking: Reported on 11/27/2018) 25 tablet 3  . pantoprazole (PROTONIX) 40 MG tablet  Take 1 tablet (40 mg total) by mouth daily. 90 tablet 3  . predniSONE (DELTASONE) 50 MG tablet One tab PO daily for 5 days. 5 tablet 0  . Rivaroxaban (XARELTO) 15 MG TABS tablet Take 1 tablet (15 mg total) by mouth daily with supper. 90 tablet 3  . tamsulosin (FLOMAX) 0.4 MG CAPS capsule Take 1 capsule (0.4 mg total) by mouth daily. 90 capsule 3  . vitamin B-12 (CYANOCOBALAMIN) 1000 MCG tablet Take 1,000 mcg by mouth daily.     No current facility-administered medications for this visit.    No Known Allergies    Discussed warning signs or symptoms. Please see discharge instructions. Patient expresses understanding.

## 2019-09-09 NOTE — Patient Instructions (Addendum)
Thank you for coming in today. I think the problem in the buttock/hip is tendonitis of the glueutus medius tendon.  This is commonly called Trochanteric Bursitis.  Attend PT.  Use the diclofenac gel 4x daily for pain as needed.  Recheck if not better.   If your back flairs up again let me or Dr T know and the next step is like MRI for injection planning.   Do the side leg raises 15-30 reps 1-2x daily.  Do the standing stretch as needed.  Remember to bend that left knee a little.    Hip Bursitis Rehab Ask your health care provider which exercises are safe for you. Do exercises exactly as told by your health care provider and adjust them as directed. It is normal to feel mild stretching, pulling, tightness, or discomfort as you do these exercises. Stop right away if you feel sudden pain or your pain gets worse. Do not begin these exercises until told by your health care provider. Stretching exercise This exercise warms up your muscles and joints and improves the movement and flexibility of your hip. This exercise also helps to relieve pain and stiffness. Iliotibial band stretch An iliotibial band is a strong band of muscle tissue that runs from the outer side of your hip to the outer side of your thigh and knee. 1. Lie on your side with your left / right leg in the top position. 2. Bend your left / right knee and grab your ankle. Stretch out your bottom arm to help you balance. 3. Slowly bring your knee back so your thigh is behind your body. 4. Slowly lower your knee toward the floor until you feel a gentle stretch on the outside of your left / right thigh. If you do not feel a stretch and your knee will not fall farther, place the heel of your other foot on top of your knee and pull your knee down toward the floor with your foot. 5. Hold this position for __________ seconds. 6. Slowly return to the starting position. Repeat __________ times. Complete this exercise __________ times a day.  Strengthening exercises These exercises build strength and endurance in your hip and pelvis. Endurance is the ability to use your muscles for a long time, even after they get tired. Bridge This exercise strengthens the muscles that move your thigh backward (hip extensors). 1. Lie on your back on a firm surface with your knees bent and your feet flat on the floor. 2. Tighten your buttocks muscles and lift your buttocks off the floor until your trunk is level with your thighs. ? Do not arch your back. ? You should feel the muscles working in your buttocks and the back of your thighs. If you do not feel these muscles, slide your feet 1-2 inches (2.5-5 cm) farther away from your buttocks. ? If this exercise is too easy, try doing it with your arms crossed over your chest. 3. Hold this position for __________ seconds. 4. Slowly lower your hips to the starting position. 5. Let your muscles relax completely after each repetition. Repeat __________ times. Complete this exercise __________ times a day. Squats This exercise strengthens the muscles in front of your thigh and knee (quadriceps). 1. Stand in front of a table, with your feet and knees pointing straight ahead. You may rest your hands on the table for balance but not for support. 2. Slowly bend your knees and lower your hips like you are going to sit in a chair. ?  Keep your weight over your heels, not over your toes. ? Keep your lower legs upright so they are parallel with the table legs. ? Do not let your hips go lower than your knees. ? Do not bend lower than told by your health care provider. ? If your hip pain increases, do not bend as low. 3. Hold the squat position for __________ seconds. 4. Slowly push with your legs to return to standing. Do not use your hands to pull yourself to standing. Repeat __________ times. Complete this exercise __________ times a day. Hip hike 1. Stand sideways on a bottom step. Stand on your left / right  leg with your other foot unsupported next to the step. You can hold on to the railing or wall for balance if needed. 2. Keep your knees straight and your torso square. Then lift your left / right hip up toward the ceiling. 3. Hold this position for __________ seconds. 4. Slowly let your left / right hip lower toward the floor, past the starting position. Your foot should get closer to the floor. Do not lean or bend your knees. Repeat __________ times. Complete this exercise __________ times a day. Single leg stand 1. Without shoes, stand near a railing or in a doorway. You may hold on to the railing or door frame as needed for balance. 2. Squeeze your left / right buttock muscles, then lift up your other foot. ? Do not let your left / right hip push out to the side. ? It is helpful to stand in front of a mirror for this exercise so you can watch your hip. 3. Hold this position for __________ seconds. Repeat __________ times. Complete this exercise __________ times a day. This information is not intended to replace advice given to you by your health care provider. Make sure you discuss any questions you have with your health care provider. Document Released: 01/02/2005 Document Revised: 03/22/2019 Document Reviewed: 03/22/2019 Elsevier Patient Education  2020 [employer].

## 2019-09-10 DIAGNOSIS — M9903 Segmental and somatic dysfunction of lumbar region: Secondary | ICD-10-CM | POA: Diagnosis not present

## 2019-09-10 DIAGNOSIS — M9905 Segmental and somatic dysfunction of pelvic region: Secondary | ICD-10-CM | POA: Diagnosis not present

## 2019-09-10 DIAGNOSIS — M461 Sacroiliitis, not elsewhere classified: Secondary | ICD-10-CM | POA: Diagnosis not present

## 2019-09-13 ENCOUNTER — Ambulatory Visit: Payer: Medicare Other | Admitting: Sports Medicine

## 2019-09-13 DIAGNOSIS — M25652 Stiffness of left hip, not elsewhere classified: Secondary | ICD-10-CM | POA: Diagnosis not present

## 2019-09-13 DIAGNOSIS — M545 Low back pain: Secondary | ICD-10-CM | POA: Diagnosis not present

## 2019-09-13 DIAGNOSIS — M6281 Muscle weakness (generalized): Secondary | ICD-10-CM | POA: Diagnosis not present

## 2019-09-13 DIAGNOSIS — M25552 Pain in left hip: Secondary | ICD-10-CM | POA: Diagnosis not present

## 2019-09-15 DIAGNOSIS — M6281 Muscle weakness (generalized): Secondary | ICD-10-CM | POA: Diagnosis not present

## 2019-09-15 DIAGNOSIS — M545 Low back pain: Secondary | ICD-10-CM | POA: Diagnosis not present

## 2019-09-15 DIAGNOSIS — M25652 Stiffness of left hip, not elsewhere classified: Secondary | ICD-10-CM | POA: Diagnosis not present

## 2019-09-15 DIAGNOSIS — M25552 Pain in left hip: Secondary | ICD-10-CM | POA: Diagnosis not present

## 2019-09-20 DIAGNOSIS — M25552 Pain in left hip: Secondary | ICD-10-CM | POA: Diagnosis not present

## 2019-09-20 DIAGNOSIS — M25652 Stiffness of left hip, not elsewhere classified: Secondary | ICD-10-CM | POA: Diagnosis not present

## 2019-09-20 DIAGNOSIS — M6281 Muscle weakness (generalized): Secondary | ICD-10-CM | POA: Diagnosis not present

## 2019-09-20 DIAGNOSIS — M545 Low back pain: Secondary | ICD-10-CM | POA: Diagnosis not present

## 2019-09-22 ENCOUNTER — Ambulatory Visit: Payer: Medicare Other | Admitting: Sports Medicine

## 2019-09-22 DIAGNOSIS — M6281 Muscle weakness (generalized): Secondary | ICD-10-CM | POA: Diagnosis not present

## 2019-09-22 DIAGNOSIS — M25552 Pain in left hip: Secondary | ICD-10-CM | POA: Diagnosis not present

## 2019-09-22 DIAGNOSIS — M25652 Stiffness of left hip, not elsewhere classified: Secondary | ICD-10-CM | POA: Diagnosis not present

## 2019-09-27 DIAGNOSIS — D485 Neoplasm of uncertain behavior of skin: Secondary | ICD-10-CM | POA: Diagnosis not present

## 2019-09-27 DIAGNOSIS — M25652 Stiffness of left hip, not elsewhere classified: Secondary | ICD-10-CM | POA: Diagnosis not present

## 2019-09-27 DIAGNOSIS — M25552 Pain in left hip: Secondary | ICD-10-CM | POA: Diagnosis not present

## 2019-09-27 DIAGNOSIS — M6281 Muscle weakness (generalized): Secondary | ICD-10-CM | POA: Diagnosis not present

## 2019-09-27 DIAGNOSIS — Z85828 Personal history of other malignant neoplasm of skin: Secondary | ICD-10-CM | POA: Diagnosis not present

## 2019-09-27 DIAGNOSIS — M545 Low back pain: Secondary | ICD-10-CM | POA: Diagnosis not present

## 2019-09-27 DIAGNOSIS — L570 Actinic keratosis: Secondary | ICD-10-CM | POA: Diagnosis not present

## 2019-09-27 DIAGNOSIS — D692 Other nonthrombocytopenic purpura: Secondary | ICD-10-CM | POA: Diagnosis not present

## 2019-09-27 DIAGNOSIS — Z08 Encounter for follow-up examination after completed treatment for malignant neoplasm: Secondary | ICD-10-CM | POA: Diagnosis not present

## 2019-09-27 DIAGNOSIS — C4442 Squamous cell carcinoma of skin of scalp and neck: Secondary | ICD-10-CM | POA: Diagnosis not present

## 2019-09-29 DIAGNOSIS — M25652 Stiffness of left hip, not elsewhere classified: Secondary | ICD-10-CM | POA: Diagnosis not present

## 2019-09-29 DIAGNOSIS — M25552 Pain in left hip: Secondary | ICD-10-CM | POA: Diagnosis not present

## 2019-09-29 DIAGNOSIS — M545 Low back pain: Secondary | ICD-10-CM | POA: Diagnosis not present

## 2019-09-29 DIAGNOSIS — M6281 Muscle weakness (generalized): Secondary | ICD-10-CM | POA: Diagnosis not present

## 2019-10-06 DIAGNOSIS — M25552 Pain in left hip: Secondary | ICD-10-CM | POA: Diagnosis not present

## 2019-10-06 DIAGNOSIS — M545 Low back pain: Secondary | ICD-10-CM | POA: Diagnosis not present

## 2019-10-06 DIAGNOSIS — M25652 Stiffness of left hip, not elsewhere classified: Secondary | ICD-10-CM | POA: Diagnosis not present

## 2019-10-06 DIAGNOSIS — M6281 Muscle weakness (generalized): Secondary | ICD-10-CM | POA: Diagnosis not present

## 2019-10-13 DIAGNOSIS — M25652 Stiffness of left hip, not elsewhere classified: Secondary | ICD-10-CM | POA: Diagnosis not present

## 2019-10-13 DIAGNOSIS — M25552 Pain in left hip: Secondary | ICD-10-CM | POA: Diagnosis not present

## 2019-10-13 DIAGNOSIS — M6281 Muscle weakness (generalized): Secondary | ICD-10-CM | POA: Diagnosis not present

## 2019-10-13 DIAGNOSIS — M545 Low back pain: Secondary | ICD-10-CM | POA: Diagnosis not present

## 2019-10-18 ENCOUNTER — Other Ambulatory Visit: Payer: Self-pay

## 2019-10-18 ENCOUNTER — Ambulatory Visit (INDEPENDENT_AMBULATORY_CARE_PROVIDER_SITE_OTHER): Payer: Medicare Other | Admitting: Sports Medicine

## 2019-10-18 ENCOUNTER — Encounter: Payer: Self-pay | Admitting: Sports Medicine

## 2019-10-18 DIAGNOSIS — M25552 Pain in left hip: Secondary | ICD-10-CM

## 2019-10-18 NOTE — Progress Notes (Signed)
Subjective:    CC: L hip pain  HPI: [name] is a pleasant 85 year old with a history significant for lumbar spinal stenosis who presents today for 2-3 weeks of L sided hip/gluteal pain. He reports that the pain is worse with laying down, prolonged sitting on hard surfaces, and while golfing. He states that it is dull and ache in character. He has been participating in physical therapy for his hip which has been evaluated previously. He has had no known trauma or falls. He has no loss of bowel/ bladder function.   I reviewed the past medical history, family history, social history, surgical history, and allergies today and no changes were needed.  Please see the problem list section below in epic for further details.  Past Medical History: Past Medical History:  Diagnosis Date  . Anemia   . Antral gastritis 2013   EGD   . Atrial fibrillation ([place])   . CAD (coronary artery disease)    s/p CABG 1993 (L-LAD, S-RI, S-D1);   echo 1/10: EF 55%;    myoview 12/09: inf MI, no ischemia  . Candida esophagitis ([place]) 2013   EGD   . Cataracts, bilateral   . Family history of malignant neoplasm of gastrointestinal tract   . Folliculitis   . Hiatal hernia   . HTN (hypertension)   . Hyperlipidemia   . Irritable bladder   . Myocardial infarction ([place])   . Obesity   . Osteoarthritis    Past Surgical History: Past Surgical History:  Procedure Laterality Date  . CATARACT EXTRACTION Right 07/21/2018  . CHOLECYSTECTOMY     laparoscopic '92  . CORONARY ARTERY BYPASS GRAFT     graft '92: LIMA-LAD, SVG - D2,R1, D1  . ENTEROSCOPY  08/22/2012   Procedure: ENTEROSCOPY;  Surgeon: Juanita Craver, MD;  Location: [facility];  Service: Endoscopy;  Laterality: N/A;  . INTRAOCULAR LENS EXCHANGE Right 07/21/2018  . KNEE SURGERY    . MOLE REMOVAL  2001 and 2008  . PILONIDAL CYST EXCISION     Social History: Social History   Socioeconomic History  . Marital status: Widowed    Spouse name: Not on file  .  Number of children: 1  . Years of education: Not on file  . Highest education level: Not on file  Occupational History  . Occupation: Retired    Fish farm manager: RETIRED  Social Needs  . Financial resource strain: Not on file  . Food insecurity    Worry: Not on file    Inability: Not on file  . Transportation needs    Medical: Not on file    Non-medical: Not on file  Tobacco Use  . Smoking status: Former Smoker    Years: 16.00    Quit date: 08/14/1964    Years since quitting: 55.2  . Smokeless tobacco: Never Used  . Tobacco comment: quit 1965  Substance and Sexual Activity  . Alcohol use: No  . Drug use: No  . Sexual activity: Not on file  Lifestyle  . Physical activity    Days per week: Not on file    Minutes per session: Not on file  . Stress: Not on file  Relationships  . Social Herbalist on phone: Not on file    Gets together: Not on file    Attends religious service: Not on file    Active member of club or organization: Not on file    Attends meetings of clubs or organizations: Not on file  Relationship status: Not on file  Other Topics Concern  . 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 Married '57- widowed Feb 18, 2023. 1 son -'59- died '63 MVA.    Lives by himself    Emergency contact: see DPR, brother and  Daron Offer [phone] (friend)   Family History: Family History  Problem Relation Age of Onset  . Coronary artery disease Father        and brother  . Heart attack Father        and brother-fatal  . Stroke Father   . Breast cancer Mother   . Alzheimer's disease Mother   . Colon cancer Maternal Uncle   . Esophageal cancer Neg Hx   . Stomach cancer Neg Hx   . Rectal cancer Neg Hx   . Prostate cancer Neg Hx    Allergies: No Known Allergies Medications: See med rec.  Review of Systems: No fevers, chills, night sweats, weight loss, chest pain, or shortness of breath.   Objective:    General: Well  Developed, well nourished, and in no acute distress.  Neuro: Alert and oriented x3, extra-ocular muscles intact, sensation grossly intact.  HEENT: Normocephalic, atraumatic, pupils equal round reactive to light, neck supple, no masses, no lymphadenopathy, thyroid nonpalpable.  Skin: Warm and dry, no rashes. Cardiac: Regular rate and rhythm, no murmurs rubs or gallops, no lower extremity edema.  Respiratory: Clear to auscultation bilaterally. Not using accessory muscles, speaking in full sentences.  L Hip: ROM IR: 45 Deg, ER: 45 Deg, Flexion: 120 Deg, Extension: 100 Deg, Abduction: 45 Deg, Adduction: 45 Deg Strength IR: 5/5, ER: 5/5, Flexion: 5/5, Extension: 5/5, Abduction: 5/5, Adduction: 5/5 Pelvic alignment unremarkable to inspection and palpation. Mixed greater trochanter versus glueus medius tenderness to palpation. No tenderness over piriformis. No pain with FABER or FADIR. No SI joint tenderness and normal minimal SI movement.  A/P: [name] has had 2-3 weeks of L hip pain worse with sitting and golfing. His pain was not due to trauma and he has not had any recent falls. He states that this pain is distinct from that of his prior lumbar spinal stenosis. Given the localization of pain to his gluteus medius muscle belly it was was injected today with instructions to follow up in one month.  mhImpression and Recommendations:    Left hip pain Pain is not localized directly over the greater trochanter, not over the SI joint. He did have some triggering over the gluteus medius muscle belly.  We will perform gluteus medius muscle belly injections today, return to see me in a month, if insufficient improvement we will proceed with lumbar spine intervention.   ___________________________________________ Gwen Her. Dianah Field, M.D., ABFM., CAQSM. Primary Care and Sports Medicine [facility]  Adjunct Professor of [employer] [facility] [employer]  [employer]

## 2019-10-18 NOTE — Assessment & Plan Note (Signed)
Pain is not localized directly over the greater trochanter, not over the SI joint. He did have some triggering over the gluteus medius muscle belly.  We will perform gluteus medius muscle belly injections today, return to see me in a month, if insufficient improvement we will proceed with lumbar spine intervention.

## 2019-10-19 NOTE — Progress Notes (Deleted)
HPI: FU coronary artery disease and atrial fibrillation. Patient is status post coronary artery bypassing graft in 1993 with a LIMA to the LAD, saphenous vein graft to the ramus intermedius and saphenous vein graft to the first diagonal. CT of the abdomen in January of 2009 showed no renal artery stenosis. There was no aortic aneurysm. Nuclear study in June 2014 showed scar in the mid/apical inferior wall and apex. There was a small area of ischemia in the base to mid anterior wall. The study was unchanged compared to May of 2012. The study was not gated. We have treated medically. Echo 12/17 showed EF 40-45, mild MR, severe LAE, moderate RAE, mild RVE, mild TR. at last office visit patient's systolic blood pressure was low 80s.  We adjusted his blood pressure regimen.  Since last seen,   Current Outpatient Medications  Medication Sig Dispense Refill  . atorvastatin (LIPITOR) 80 MG tablet Take 1 tablet (80 mg total) by mouth daily. 90 tablet 2  . benazepril (LOTENSIN) 10 MG tablet Take 1 tablet (10 mg total) by mouth daily. 90 tablet 1  . Cholecalciferol (VITAMIN D) 1000 UNITS capsule Take 1,000 Units by mouth daily.      . Coenzyme Q10 (COQ10 PO) Take 1 capsule by mouth daily.    . diclofenac sodium (VOLTAREN) 1 % GEL Apply 4 g topically 4 (four) times daily. To affected joint. 100 g 11  . diphenhydramine-acetaminophen (TYLENOL PM) 25-500 MG TABS tablet Take 2 tablets by mouth at bedtime.    . furosemide (LASIX) 40 MG tablet Take 1 tablet (40 mg total) by mouth daily. 90 tablet 1  . Melatonin 5 MG TABS Take 5 mg by mouth at bedtime.    . metoprolol succinate (TOPROL-XL) 25 MG 24 hr tablet Take 1 tablet (25 mg total) by mouth daily. 90 tablet 1  . Multiple Vitamin (MULTIVITAMIN WITH MINERALS) TABS tablet Take 1 tablet by mouth daily.    . nitroGLYCERIN (NITROSTAT) 0.4 MG SL tablet Place 1 tablet (0.4 mg total) under the tongue every 5 (five) minutes as needed for chest pain. (Patient not  taking: Reported on 11/27/2018) 25 tablet 3  . pantoprazole (PROTONIX) 40 MG tablet Take 1 tablet (40 mg total) by mouth daily. 90 tablet 3  . Rivaroxaban (XARELTO) 15 MG TABS tablet Take 1 tablet (15 mg total) by mouth daily with supper. 90 tablet 3  . tamsulosin (FLOMAX) 0.4 MG CAPS capsule Take 1 capsule (0.4 mg total) by mouth daily. 90 capsule 3  . vitamin B-12 (CYANOCOBALAMIN) 1000 MCG tablet Take 1,000 mcg by mouth daily.     No current facility-administered medications for this visit.      Past Medical History:  Diagnosis Date  . Anemia   . Antral gastritis 2013   EGD   . Atrial fibrillation ([place])   . CAD (coronary artery disease)    s/p CABG 1993 (L-LAD, S-RI, S-D1);   echo 1/10: EF 55%;    myoview 12/09: inf MI, no ischemia  . Candida esophagitis ([place]) 2013   EGD   . Cataracts, bilateral   . Family history of malignant neoplasm of gastrointestinal tract   . Folliculitis   . Hiatal hernia   . HTN (hypertension)   . Hyperlipidemia   . Irritable bladder   . Myocardial infarction ([place])   . Obesity   . Osteoarthritis     Past Surgical History:  Procedure Laterality Date  . CATARACT EXTRACTION Right 07/21/2018  .  CHOLECYSTECTOMY     laparoscopic '92  . CORONARY ARTERY BYPASS GRAFT     graft '92: LIMA-LAD, SVG - D2,R1, D1  . ENTEROSCOPY  08/22/2012   Procedure: ENTEROSCOPY;  Surgeon: Juanita Craver, MD;  Location: [facility];  Service: Endoscopy;  Laterality: N/A;  . INTRAOCULAR LENS EXCHANGE Right 07/21/2018  . KNEE SURGERY    . MOLE REMOVAL  2001 and 2008  . PILONIDAL CYST EXCISION      Social History   Socioeconomic History  . Marital status: Widowed    Spouse name: Not on file  . Number of children: 1  . Years of education: Not on file  . Highest education level: Not on file  Occupational History  . Occupation: Retired    Fish farm manager: RETIRED  Social Needs  . Financial resource strain: Not on file  . Food insecurity    Worry: Not on file    Inability:  Not on file  . Transportation needs    Medical: Not on file    Non-medical: Not on file  Tobacco Use  . Smoking status: Former Smoker    Years: 16.00    Quit date: 08/14/1964    Years since quitting: 55.2  . Smokeless tobacco: Never Used  . Tobacco comment: quit 1965  Substance and Sexual Activity  . Alcohol use: No  . Drug use: No  . Sexual activity: Not on file  Lifestyle  . Physical activity    Days per week: Not on file    Minutes per session: Not on file  . Stress: Not on file  Relationships  . Social Herbalist on phone: Not on file    Gets together: Not on file    Attends religious service: Not on file    Active member of club or organization: Not on file    Attends meetings of clubs or organizations: Not on file    Relationship status: Not on file  . Intimate partner violence    Fear of current or ex partner: Not on file    Emotionally abused: Not on file    Physically abused: Not on file    Forced sexual activity: Not on file  Other Topics Concern  . 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 Married '57- widowed 02/20/23. 1 son -'49- died '4 MVA.    Lives by himself    Emergency contact: see DPR, brother and  Daron Offer [phone] (friend)    Family History  Problem Relation Age of Onset  . Coronary artery disease Father        and brother  . Heart attack Father        and brother-fatal  . Stroke Father   . Breast cancer Mother   . Alzheimer's disease Mother   . Colon cancer Maternal Uncle   . Esophageal cancer Neg Hx   . Stomach cancer Neg Hx   . Rectal cancer Neg Hx   . Prostate cancer Neg Hx     ROS: no fevers or chills, productive cough, hemoptysis, dysphasia, odynophagia, melena, hematochezia, dysuria, hematuria, rash, seizure activity, orthopnea, PND, pedal edema, claudication. Remaining systems are negative.  Physical Exam: Well-developed well-nourished in no acute distress.  Skin is warm and  dry.  HEENT is normal.  Neck is supple.  Chest is clear to auscultation with normal expansion.  Cardiovascular exam is regular rate and rhythm.  Abdominal exam nontender  or distended. No masses palpated. Extremities show no edema. neuro grossly intact  ECG- personally reviewed  A/P  1 coronary artery disease-patient has not had recurrent chest pain.  Continue medical therapy with statin.  He is not on aspirin given need for anticoagulation.  2 permanent atrial fibrillation-continue beta-blocker at present dose.  Continue Xarelto.  Check potassium, renal function and hemoglobin.  3 chronic combined systolic/diastolic congestive heart failure-patient is euvolemic.  Continue present dose of diuretic.  4 hypertension-blood pressure controlled.  Continue present medications and follow.  5 hyperlipidemia-continue statin.  Kirk Ruths, MD

## 2019-10-27 ENCOUNTER — Ambulatory Visit: Payer: Medicare Other | Admitting: Cardiology

## 2019-11-03 ENCOUNTER — Other Ambulatory Visit: Payer: Self-pay

## 2019-11-03 ENCOUNTER — Emergency Department
Admission: EM | Admit: 2019-11-03 | Discharge: 2019-11-03 | Disposition: A | Discharge status: Home / Self Care | Payer: Medicare Other | Source: Home / Self Care | Attending: Family Medicine | Admitting: Family Medicine

## 2019-11-03 ENCOUNTER — Encounter: Payer: Self-pay | Admitting: Emergency Medicine

## 2019-11-03 DIAGNOSIS — L03211 Cellulitis of face: Secondary | ICD-10-CM | POA: Diagnosis not present

## 2019-11-03 MED ORDER — DOXYCYCLINE HYCLATE 100 MG PO CAPS: 100.0000 mg | ORAL_CAPSULE | Freq: Two times a day (BID) | ORAL | 0 refills | Status: DC

## 2019-11-03 NOTE — Discharge Instructions (Addendum)
Drink enough fluid to keep your urine pale yellow.

## 2019-11-03 NOTE — ED Provider Notes (Signed)
[name]    CSN: [id number] Arrival date & time: 11/03/19  1640      History   Chief Complaint Chief Complaint  Patient presents with  . Rash    HPI [name] is a 85 y.o. male.   Patient complains of an itching, burning rash on both sides of his face for about 6 days, with increased soreness on the left.  He denies the appearance of vesicles.  He notes that he has been treated by a dermatologist with cryotherapy for facial lesions.  He denies swelling or warmth.  The history is provided by the patient.  Rash Location:  Face Facial rash location:  L cheek and R cheek Quality: burning, dryness, itchiness, painful and redness   Quality: not blistering, not bruising, not draining, not peeling, not scaling, not swelling and not weeping   Pain details:    Quality:  Itching and burning   Severity:  Mild   Onset quality:  Gradual   Duration:  6 days   Timing:  Constant   Progression:  Worsening Severity:  Mild Onset quality:  Gradual Duration:  6 days Timing:  Constant Progression:  Worsening Chronicity:  New Context: not animal contact, not chemical exposure, not exposure to similar rash, not food, not insect bite/sting, not medications, not new detergent/soap, not nuts and not plant contact   Relieved by:  None tried Worsened by:  Nothing Ineffective treatments:  None tried Associated symptoms: no fever, no induration, no joint pain and no periorbital edema     Past Medical History:  Diagnosis Date  . Anemia   . Antral gastritis 2013   EGD   . Atrial fibrillation ([place])   . CAD (coronary artery disease)    s/p CABG 1993 (L-LAD, S-RI, S-D1);   echo 1/10: EF 55%;    myoview 12/09: inf MI, no ischemia  . Candida esophagitis ([place]) 2013   EGD   . Cataracts, bilateral   . Family history of malignant neoplasm of gastrointestinal tract   . Folliculitis   . Hiatal hernia   . HTN (hypertension)   . Hyperlipidemia   . Irritable bladder   . Myocardial  infarction ([place])   . Obesity   . Osteoarthritis     Patient Active Problem List   Diagnosis Date Noted  . Left hip pain 10/18/2019  . Lumbar spinal stenosis 04/16/2019  . CHF (congestive heart failure) ([place]) 12/17/2016  . Arthritis of left ankle 10/15/2016  . PCP NOTES >>>>> 10/11/2015  . Carpal tunnel syndrome 02/15/2014  . Anemia 08/14/2012  . Annual physical exam 01/08/2012  . Atrial fibrillation ([place]) 03/29/2011  . BRADYCARDIA 06/19/2010  . Venous (peripheral) insufficiency 09/04/2009  . PALPITATIONS 07/07/2009  . OBESITY 07/04/2009  . DJD (degenerative joint disease) 07/04/2009  . SLEEP DISORDER 07/04/2009  . Dyspepsia 02/28/2009  . HEMATOCHEZIA 02/07/2009  . DM II (diabetes mellitus, type II), controlled ([place]) 04/25/2008  . Hyperlipidemia 06/25/2007  . Essential hypertension 06/25/2007  . Coronary atherosclerosis 06/25/2007    Past Surgical History:  Procedure Laterality Date  . CATARACT EXTRACTION Right 07/21/2018  . CHOLECYSTECTOMY     laparoscopic '92  . CORONARY ARTERY BYPASS GRAFT     graft '92: LIMA-LAD, SVG - D2,R1, D1  . ENTEROSCOPY  08/22/2012   Procedure: ENTEROSCOPY;  Surgeon: Juanita Craver, MD;  Location: [facility];  Service: Endoscopy;  Laterality: N/A;  . INTRAOCULAR LENS EXCHANGE Right 07/21/2018  . KNEE SURGERY    . MOLE REMOVAL  2001 and 2008  . PILONIDAL CYST EXCISION         Home Medications    Prior to Admission medications   Medication Sig Start Date End Date Taking? Authorizing Provider  atorvastatin (LIPITOR) 80 MG tablet Take 1 tablet (80 mg total) by mouth daily. 08/31/19   Lelon Perla, MD  benazepril (LOTENSIN) 10 MG tablet Take 1 tablet (10 mg total) by mouth daily. 07/28/19   Colon Branch, MD  Cholecalciferol (VITAMIN D) 1000 UNITS capsule Take 1,000 Units by mouth daily.      [provider]  Coenzyme Q10 (COQ10 PO) Take 1 capsule by mouth daily.    [provider]  diclofenac sodium (VOLTAREN) 1 % GEL  Apply 4 g topically 4 (four) times daily. To affected joint. 09/09/19   Gregor Hams, MD  diphenhydramine-acetaminophen (TYLENOL PM) 25-500 MG TABS tablet Take 2 tablets by mouth at bedtime.    [provider]  doxycycline (VIBRAMYCIN) 100 MG capsule Take 1 capsule (100 mg total) by mouth 2 (two) times daily. Take with food. 11/03/19   Kandra Nicolas, MD  furosemide (LASIX) 40 MG tablet Take 1 tablet (40 mg total) by mouth daily. 07/28/19   Colon Branch, MD  Melatonin 5 MG TABS Take 5 mg by mouth at bedtime.    [provider]  metoprolol succinate (TOPROL-XL) 25 MG 24 hr tablet Take 1 tablet (25 mg total) by mouth daily. 07/28/19   Colon Branch, MD  Multiple Vitamin (MULTIVITAMIN WITH MINERALS) TABS tablet Take 1 tablet by mouth daily.    [provider]  pantoprazole (PROTONIX) 40 MG tablet Take 1 tablet (40 mg total) by mouth daily. 06/01/19   Colon Branch, MD  Rivaroxaban (XARELTO) 15 MG TABS tablet Take 1 tablet (15 mg total) by mouth daily with supper. 07/28/19   Colon Branch, MD  tamsulosin (FLOMAX) 0.4 MG CAPS capsule Take 1 capsule (0.4 mg total) by mouth daily. 12/31/18   Colon Branch, MD  vitamin B-12 (CYANOCOBALAMIN) 1000 MCG tablet Take 1,000 mcg by mouth daily.    [provider]    Family History Family History  Problem Relation Age of Onset  . Coronary artery disease Father        and brother  . Heart attack Father        and brother-fatal  . Stroke Father   . Breast cancer Mother   . Alzheimer's disease Mother   . Colon cancer Maternal Uncle   . Esophageal cancer Neg Hx   . Stomach cancer Neg Hx   . Rectal cancer Neg Hx   . Prostate cancer Neg Hx     Social History Social History   Tobacco Use  . Smoking status: Former Smoker    Years: 16.00    Quit date: 08/14/1964    Years since quitting: 55.2  . Smokeless tobacco: Never Used  . Tobacco comment: quit 1965  Substance Use Topics  . Alcohol use: Yes  . Drug use: No     Allergies    Patient has no known allergies.   Review of Systems Review of Systems  Constitutional: Negative for fever.  Musculoskeletal: Negative for arthralgias.  Skin: Positive for rash.  All other systems reviewed and are negative.    Physical Exam Triage Vital Signs ED Triage Vitals  Enc Vitals Group     BP 11/03/19 1659 110/66     Pulse Rate 11/03/19 1659 74  Resp --      Temp 11/03/19 1659 98.3 F (36.8 C)     Temp Source 11/03/19 1659 Oral     SpO2 11/03/19 1659 99 %     Weight 11/03/19 1700 218 lb (98.9 kg)     Height 11/03/19 1700 5\' 10"  (1.778 m)     Head Circumference --      Peak Flow --      Pain Score 11/03/19 1700 0     Pain Loc --      Pain Edu? --      Excl. in [place]? --    No data found.  Updated Vital Signs BP 110/66 (BP Location: Right Arm)   Pulse 74   Temp 98.3 F (36.8 C) (Oral)   Ht 5\' 10"  (1.778 m)   Wt 98.9 kg   SpO2 99%   BMI 31.28 kg/m   Visual Acuity Right Eye Distance:   Left Eye Distance:   Bilateral Distance:    Right Eye Near:   Left Eye Near:    Bilateral Near:     Physical Exam Vitals signs and nursing note reviewed.  Constitutional:      General: He is not in acute distress. HENT:     Head: Atraumatic. No right periorbital erythema or left periorbital erythema.      Comments: Bilateral cheeks are slightly erythematous with multiple small erythematous macules in a mask-like distribution.  There is no tenderness to palpation, induration or fluctuance.  On the left cheek there is an 15mm crusted lesion suggestive of actinic keratosis.    Right Ear: External ear normal.     Left Ear: External ear normal.     Nose: Nose normal.     Mouth/Throat:     Pharynx: Oropharynx is clear.  Eyes:     Extraocular Movements: Extraocular movements intact.     Conjunctiva/sclera: Conjunctivae normal.     Pupils: Pupils are equal, round, and reactive to light.  Cardiovascular:     Rate and Rhythm: Normal rate.  Pulmonary:     Effort:  Pulmonary effort is normal.  Lymphadenopathy:     Cervical: No cervical adenopathy.  Skin:    General: Skin is warm and dry.  Neurological:     Mental Status: He is alert.      UC Treatments / Results  Labs (all labs ordered are listed, but only abnormal results are displayed) Labs Reviewed - No data to display  EKG   Radiology No results found.  Procedures Procedures (including critical care time)  Medications Ordered in UC Medications - No data to display  Initial Impression / Assessment and Plan / UC Course  I have reviewed the triage vital signs and the nursing notes.  Pertinent labs & imaging results that were available during my care of the patient were reviewed by me and considered in my medical decision making (see chart for details).    Begin doxycycline for staph coverage. With his history of actinic keratoses, recommend follow-up with dermatologist.   Final Clinical Impressions(s) / UC Diagnoses   Final diagnoses:  Cellulitis, face     Discharge Instructions     Drink enough fluid to keep your urine pale yellow.    ED Prescriptions    Medication Sig Dispense Auth. Provider   doxycycline (VIBRAMYCIN) 100 MG capsule Take 1 capsule (100 mg total) by mouth 2 (two) times daily. Take with food. 20 capsule Kandra Nicolas, MD  Kandra Nicolas, MD 11/06/19 2126

## 2019-11-03 NOTE — ED Triage Notes (Addendum)
Rash on face x 1 week, itching, sore on left side, dry skin on face when he woke up this morning

## 2019-11-15 ENCOUNTER — Ambulatory Visit: Payer: Medicare Other | Admitting: Sports Medicine

## 2019-11-15 DIAGNOSIS — L718 Other rosacea: Secondary | ICD-10-CM | POA: Diagnosis not present

## 2019-11-24 DIAGNOSIS — B353 Tinea pedis: Secondary | ICD-10-CM | POA: Diagnosis not present

## 2019-11-24 DIAGNOSIS — B351 Tinea unguium: Secondary | ICD-10-CM | POA: Diagnosis not present

## 2019-11-28 ENCOUNTER — Other Ambulatory Visit: Payer: Self-pay | Admitting: Internal Medicine

## 2019-12-30 ENCOUNTER — Ambulatory Visit: Payer: Medicare Other | Attending: Internal Medicine

## 2019-12-30 DIAGNOSIS — Z23 Encounter for immunization: Secondary | ICD-10-CM

## 2019-12-30 NOTE — Progress Notes (Signed)
   Covid-19 Vaccination Clinic  Name:  [name]    MRN: [id number] DOB: [birth year]  12/30/2019  [name] was observed post Covid-19 immunization for 15 minutes without incidence. He was provided with Vaccine Information Sheet and instruction to access the V-Safe system.   [name] was instructed to call 911 with any severe reactions post vaccine: Marland Kitchen Difficulty breathing  . Swelling of your face and throat  . A fast heartbeat  . A bad rash all over your body  . Dizziness and weakness    Immunizations Administered    Name Date Dose VIS Date Route   Pfizer COVID-19 Vaccine 12/30/2019 11:11 AM 0.3 mL 11/19/2019 Intramuscular   Manufacturer: [employer], [employer]   Lot: [id number]   [place]: [id number]

## 2020-01-12 NOTE — Progress Notes (Signed)
HPI: FU coronary artery disease and atrial fibrillation. Patient is status post coronary artery bypassing graft in 1993 with a LIMA to the LAD, saphenous vein graft to the ramus intermedius and saphenous vein graft to the first diagonal. CT of the abdomen in January of 2009 showed no renal artery stenosis. There was no aortic aneurysm. Nuclear study in June 2014 showed scar in the mid/apical inferior wall and apex. There was a small area of ischemia in the base to mid anterior wall. The study was unchanged compared to May of 2012. The study was not gated. We have treated medically. Echo 12/17 showed EF 40-45, mild MR, severe LAE, moderate RAE, mild RVE, mild TR. at last office visit patient's systolic blood pressure was low 80s.  We adjusted his blood pressure regimen.  Since last seen,patient denies dyspnea, chest pain, palpitations, syncope or bleeding.  He has chronic mild pedal edema.  Current Outpatient Medications  Medication Sig Dispense Refill  . atorvastatin (LIPITOR) 80 MG tablet Take 1 tablet (80 mg total) by mouth daily. 90 tablet 2  . benazepril (LOTENSIN) 10 MG tablet Take 1 tablet (10 mg total) by mouth daily. 90 tablet 1  . Cholecalciferol (VITAMIN D) 1000 UNITS capsule Take 1,000 Units by mouth daily.      . Coenzyme Q10 (COQ10 PO) Take 1 capsule by mouth daily.    . diclofenac sodium (VOLTAREN) 1 % GEL Apply 4 g topically 4 (four) times daily. To affected joint. 100 g 11  . diphenhydramine-acetaminophen (TYLENOL PM) 25-500 MG TABS tablet Take 2 tablets by mouth at bedtime.    . furosemide (LASIX) 40 MG tablet Take 1 tablet (40 mg total) by mouth daily. 90 tablet 1  . Melatonin 5 MG TABS Take 5 mg by mouth at bedtime.    . metoprolol succinate (TOPROL-XL) 25 MG 24 hr tablet Take 1 tablet (25 mg total) by mouth daily. 90 tablet 1  . Multiple Vitamin (MULTIVITAMIN WITH MINERALS) TABS tablet Take 1 tablet by mouth daily.    . pantoprazole (PROTONIX) 40 MG tablet Take 1 tablet  (40 mg total) by mouth daily. 90 tablet 3  . Rivaroxaban (XARELTO) 15 MG TABS tablet Take 1 tablet (15 mg total) by mouth daily with supper. 90 tablet 3  . tamsulosin (FLOMAX) 0.4 MG CAPS capsule Take 1 capsule (0.4 mg total) by mouth daily. 90 capsule 3  . vitamin B-12 (CYANOCOBALAMIN) 1000 MCG tablet Take 1,000 mcg by mouth daily.     No current facility-administered medications for this visit.     Past Medical History:  Diagnosis Date  . Anemia   . Antral gastritis 2013   EGD   . Atrial fibrillation ([place])   . CAD (coronary artery disease)    s/p CABG 1993 (L-LAD, S-RI, S-D1);   echo 1/10: EF 55%;    myoview 12/09: inf MI, no ischemia  . Candida esophagitis ([place]) 2013   EGD   . Cataracts, bilateral   . Family history of malignant neoplasm of gastrointestinal tract   . Folliculitis   . Hiatal hernia   . HTN (hypertension)   . Hyperlipidemia   . Irritable bladder   . Myocardial infarction ([place])   . Obesity   . Osteoarthritis     Past Surgical History:  Procedure Laterality Date  . CATARACT EXTRACTION Right 07/21/2018  . CHOLECYSTECTOMY     laparoscopic '92  . CORONARY ARTERY BYPASS GRAFT     graft '92: LIMA-LAD, SVG -  D2,R1, D1  . ENTEROSCOPY  08/22/2012   Procedure: ENTEROSCOPY;  Surgeon: Juanita Craver, MD;  Location: [facility];  Service: Endoscopy;  Laterality: N/A;  . INTRAOCULAR LENS EXCHANGE Right 07/21/2018  . KNEE SURGERY    . MOLE REMOVAL  2001 and 2008  . PILONIDAL CYST EXCISION      Social History   Socioeconomic History  . Marital status: Widowed    Spouse name: Not on file  . Number of children: 1  . Years of education: Not on file  . Highest education level: Not on file  Occupational History  . Occupation: Retired    Fish farm manager: RETIRED  Tobacco Use  . Smoking status: Former Smoker    Years: 16.00    Quit date: 08/14/1964    Years since quitting: 55.4  . Smokeless tobacco: Never Used  . Tobacco comment: quit 1965  Substance and Sexual Activity    . Alcohol use: Yes  . Drug use: No  . Sexual activity: Not on file  Other Topics Concern  . 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 Married '57- widowed 2023/03/04. 1 son -'67- died '61 MVA.    Lives by himself    Emergency contact: see DPR, brother and  Daron Offer [phone] (friend)   Social Determinants of Health   Financial Resource Strain:   . Difficulty of Paying Living Expenses: Not on file  Food Insecurity:   . Worried About Charity fundraiser in the Last Year: Not on file  . Ran Out of Food in the Last Year: Not on file  Transportation Needs:   . Lack of Transportation (Medical): Not on file  . Lack of Transportation (Non-Medical): Not on file  Physical Activity:   . Days of Exercise per Week: Not on file  . Minutes of Exercise per Session: Not on file  Stress:   . Feeling of Stress : Not on file  Social Connections:   . Frequency of Communication with Friends and Family: Not on file  . Frequency of Social Gatherings with Friends and Family: Not on file  . Attends Religious Services: Not on file  . Active Member of Clubs or Organizations: Not on file  . Attends Archivist Meetings: Not on file  . Marital Status: Not on file  Intimate Partner Violence:   . Fear of Current or Ex-Partner: Not on file  . Emotionally Abused: Not on file  . Physically Abused: Not on file  . Sexually Abused: Not on file    Family History  Problem Relation Age of Onset  . Coronary artery disease Father        and brother  . Heart attack Father        and brother-fatal  . Stroke Father   . Breast cancer Mother   . Alzheimer's disease Mother   . Colon cancer Maternal Uncle   . Esophageal cancer Neg Hx   . Stomach cancer Neg Hx   . Rectal cancer Neg Hx   . Prostate cancer Neg Hx     ROS: no fevers or chills, productive cough, hemoptysis, dysphasia, odynophagia, melena, hematochezia, dysuria, hematuria, rash, seizure activity,  orthopnea, PND, claudication. Remaining systems are negative.  Physical Exam: Well-developed well-nourished in no acute distress.  Skin is warm and dry.  HEENT is normal.  Neck is supple.  Chest is clear to auscultation with normal expansion.  Cardiovascular exam is irregular  Abdominal exam nontender or distended. No masses palpated. Extremities show trace edema. neuro grossly intact  ECG-atrial fibrillation at a rate of 60, normal axis, no ST changes.  Personally reviewed  A/P  1 permanent atrial fibrillation-continue beta-blocker for rate control.  Continue Xarelto.  We will have most recent hemoglobin and renal function forwarded to [place] from primary care.  2 coronary artery disease-patient denies chest pain.  Continue statin.  He is not on aspirin given need for anticoagulation.  3 hypertension-blood pressure elevated.  I have asked him to follow this and we will increase benazepril if needed.  4 hyperlipidemia-continue statin.  Will have lipids and liver forwarded to [place] from primary care.  5 chronic combined systolic/diastolic congestive heart failure-patient is doing well from a volume standpoint.  Continue present dose of diuretic.  I will have most recent potassium and renal function forwarded from primary care.  Kirk Ruths, MD

## 2020-01-17 ENCOUNTER — Ambulatory Visit: Payer: Medicare Other | Attending: Internal Medicine

## 2020-01-17 DIAGNOSIS — Z23 Encounter for immunization: Secondary | ICD-10-CM | POA: Insufficient documentation

## 2020-01-17 LAB — HM DIABETES FOOT EXAM: HM Diabetic Foot Exam: NORMAL

## 2020-01-17 NOTE — Progress Notes (Signed)
   Covid-19 Vaccination Clinic  Name:  [name]    MRN: [id number] DOB: 20-Dec-1930  01/17/2020  [name] was observed post Covid-19 immunization for 15 minutes without incidence. He was provided with Vaccine Information Sheet and instruction to access the V-Safe system.   [name] was instructed to call 911 with any severe reactions post vaccine: Marland Kitchen Difficulty breathing  . Swelling of your face and throat  . A fast heartbeat  . A bad rash all over your body  . Dizziness and weakness    Immunizations Administered    Name Date Dose VIS Date Route   Pfizer COVID-19 Vaccine 01/17/2020 11:08 AM 0.3 mL 11/19/2019 Intramuscular   Manufacturer: [place]   Lot: [id number]   [place]: [id number]

## 2020-01-19 ENCOUNTER — Ambulatory Visit: Payer: Medicare Other | Admitting: Cardiology

## 2020-01-19 ENCOUNTER — Encounter: Payer: Self-pay | Admitting: Cardiology

## 2020-01-19 ENCOUNTER — Other Ambulatory Visit: Payer: Self-pay

## 2020-01-19 VITALS — BP 145/84 | HR 60 | Ht 70.0 in | Wt 224.1 lb

## 2020-01-19 DIAGNOSIS — I2510 Atherosclerotic heart disease of native coronary artery without angina pectoris: Secondary | ICD-10-CM | POA: Diagnosis not present

## 2020-01-19 DIAGNOSIS — I5042 Chronic combined systolic (congestive) and diastolic (congestive) heart failure: Secondary | ICD-10-CM | POA: Diagnosis not present

## 2020-01-19 DIAGNOSIS — I4821 Permanent atrial fibrillation: Secondary | ICD-10-CM | POA: Diagnosis not present

## 2020-01-19 DIAGNOSIS — I10 Essential (primary) hypertension: Secondary | ICD-10-CM

## 2020-01-19 NOTE — Patient Instructions (Signed)
Medication Instructions:  NO CHANGE *If you need a refill on your cardiac medications before your next appointment, please call your pharmacy*  Lab Work: If you have labs (blood work) drawn today and your tests are completely normal, you will receive your results only by: . MyChart Message (if you have MyChart) OR . A paper copy in the mail If you have any lab test that is abnormal or we need to change your treatment, we will call you to review the results.  Follow-Up: At [facility], you and your health needs are our priority.  As part of our continuing mission to provide you with exceptional heart care, we have created designated Provider Care Teams.  These Care Teams include your primary Cardiologist (physician) and Advanced Practice Providers (APPs -  Physician Assistants and Nurse Practitioners) who all work together to provide you with the care you need, when you need it.  Your next appointment:   6 month(s)  The format for your next appointment:   Either In Person or Virtual  Provider:   Brian Crenshaw, MD   

## 2020-01-27 ENCOUNTER — Other Ambulatory Visit: Payer: Self-pay

## 2020-01-28 ENCOUNTER — Ambulatory Visit: Payer: Medicare Other | Admitting: Internal Medicine

## 2020-01-28 ENCOUNTER — Encounter: Payer: Self-pay | Admitting: Internal Medicine

## 2020-01-28 ENCOUNTER — Ambulatory Visit (INDEPENDENT_AMBULATORY_CARE_PROVIDER_SITE_OTHER): Payer: Medicare Other | Admitting: Internal Medicine

## 2020-01-28 VITALS — BP 158/71 | HR 47 | Temp 96.6°F | Resp 18 | Ht 70.0 in | Wt 223.5 lb

## 2020-01-28 DIAGNOSIS — I4891 Unspecified atrial fibrillation: Secondary | ICD-10-CM | POA: Diagnosis not present

## 2020-01-28 DIAGNOSIS — E7800 Pure hypercholesterolemia, unspecified: Secondary | ICD-10-CM | POA: Diagnosis not present

## 2020-01-28 DIAGNOSIS — E1159 Type 2 diabetes mellitus with other circulatory complications: Secondary | ICD-10-CM | POA: Diagnosis not present

## 2020-01-28 DIAGNOSIS — I10 Essential (primary) hypertension: Secondary | ICD-10-CM | POA: Diagnosis not present

## 2020-01-28 NOTE — Progress Notes (Signed)
Pre visit review using our clinic review tool, if applicable. No additional management support is needed unless otherwise documented below in the visit note. 

## 2020-01-28 NOTE — Progress Notes (Signed)
Subjective:    Patient ID: Florence Canner, male    DOB: Aug 16, 1931, 86 y.o.   MRN: [id number]  DOS:  01/28/2020 Type of visit - description: Routine visit Today we talk about his blood pressure, CAD, CHF and GERD. I reviewed the cardiology note. In general he feels well. Ambulatory BPs are in the high side, from the 130s to the 160s, mostly in the high 150s. Good med compliance Reports a good diet with low-salt He remains active playing golf   Wt Readings from Last 3 Encounters:  01/28/20 223 lb 8 oz (101.4 kg)  01/19/20 224 lb 1.9 oz (101.7 kg)  11/03/19 218 lb (98.9 kg)      Review of Systems Denies chest pain or difficulty breathing No blood in the stools or in the urine Has mild lower extremity edema, at baseline   Past Medical History:  Diagnosis Date  . Anemia   . Antral gastritis 2013   EGD   . Atrial fibrillation ([place])   . CAD (coronary artery disease)    s/p CABG 1993 (L-LAD, S-RI, S-D1);   echo 1/10: EF 55%;    myoview 12/09: inf MI, no ischemia  . Candida esophagitis ([place]) 2013   EGD   . Cataracts, bilateral   . Family history of malignant neoplasm of gastrointestinal tract   . Folliculitis   . Hiatal hernia   . HTN (hypertension)   . Hyperlipidemia   . Irritable bladder   . Myocardial infarction ([place])   . Obesity   . Osteoarthritis     Past Surgical History:  Procedure Laterality Date  . CATARACT EXTRACTION Right 07/21/2018  . CHOLECYSTECTOMY     laparoscopic '92  . CORONARY ARTERY BYPASS GRAFT     graft '92: LIMA-LAD, SVG - D2,R1, D1  . ENTEROSCOPY  08/22/2012   Procedure: ENTEROSCOPY;  Surgeon: Juanita Craver, MD;  Location: [facility];  Service: Endoscopy;  Laterality: N/A;  . INTRAOCULAR LENS EXCHANGE Right 07/21/2018  . KNEE SURGERY    . MOLE REMOVAL  2001 and 2008  . PILONIDAL CYST EXCISION      Allergies as of 01/28/2020   No Known Allergies     Medication List       Accurate as of January 28, 2020 11:59 PM. If you have any  questions, ask your nurse or doctor.        atorvastatin 80 MG tablet Commonly known as: LIPITOR Take 1 tablet (80 mg total) by mouth daily.   benazepril 10 MG tablet Commonly known as: LOTENSIN Take 2 tablets (20 mg total) by mouth daily. What changed: how much to take Changed by: Kathlene November, MD   COQ10 PO Take 1 capsule by mouth daily.   diclofenac sodium 1 % Gel Commonly known as: VOLTAREN Apply 4 g topically 4 (four) times daily. To affected joint.   diphenhydramine-acetaminophen 25-500 MG Tabs tablet Commonly known as: TYLENOL PM Take 2 tablets by mouth at bedtime.   furosemide 40 MG tablet Commonly known as: LASIX Take 1 tablet (40 mg total) by mouth daily.   Melatonin 5 MG Tabs Take 5 mg by mouth at bedtime.   metoprolol succinate 25 MG 24 hr tablet Commonly known as: TOPROL-XL Take 1 tablet (25 mg total) by mouth daily.   multivitamin with minerals Tabs tablet Take 1 tablet by mouth daily.   pantoprazole 40 MG tablet Commonly known as: PROTONIX Take 1 tablet (40 mg total) by mouth daily.   Rivaroxaban 15 MG Tabs  tablet Commonly known as: XARELTO Take 1 tablet (15 mg total) by mouth daily with supper.   tamsulosin 0.4 MG Caps capsule Commonly known as: FLOMAX Take 1 capsule (0.4 mg total) by mouth daily.   vitamin B-12 1000 MCG tablet Commonly known as: CYANOCOBALAMIN Take 1,000 mcg by mouth daily.   Vitamin D 1000 units capsule Take 1,000 Units by mouth daily.             Objective:   Physical Exam BP (!) 158/71 (BP Location: Left Arm, Patient Position: Sitting, Cuff Size: Normal)   Pulse (!) 47   Temp (!) 96.6 F (35.9 C) (Temporal)   Resp 18   Ht 5\' 10"  (1.778 m)   Wt 223 lb 8 oz (101.4 kg)   SpO2 98%   BMI 32.07 kg/m  General:   Well developed, NAD, BMI noted. HEENT:  Normocephalic . Face symmetric, atraumatic Lungs:  CTA B Normal respiratory effort, no intercostal retractions, no accessory muscle use. Heart: Irregularly  irregular Lower extremities: trace pretibial edema bilaterally  Skin: Not pale. Not jaundice Neurologic:  alert & oriented X3.  Speech normal, gait appropriate for age and unassisted Psych--  Cognition and judgment appear intact.  Cooperative with normal attention span and concentration.  Behavior appropriate. No anxious or depressed appearing.      Assessment      Assessment  DM --diet control, no neuropathy HTN Hyperlipidemia CRI:  CV: --CAD, CABG 1993, Myoview 2009 no ischemia. --Atrial fibrillation -- rate control, xarelto -- chronic combined systolic/diastolic congestive heart failure Venous insufficiency, mild LE edema L>>R  LUTS:  DRE-PSA wnl 07-2016 MSK: DJD, spinal stenosis, R wrist Fx in the 80s GI: --Recurrent melena: Work-up 2013 Dr. Sharlett Iles: Colonoscopy, EGD, capsule endoscopy.  Felt to be due to NSAIDs --Candida esophagitis, antral gastritis --->  2013 per EGD --HH Sees derm x 2/year  PLAN: DM: Last A1c 6.9, is doing okay with lifestyle, on no medications.  Recheck A1c, consider a SGLT2s HTN: Currently on Lotensin, Lasix, metoprolol.  Ambulatory BPs mostly in the 150s and 160s.  Increase Lotensin to 20 mg, BMP in 10 days, monitor BPs. CAD, atrial fibrillation, CHF: Last visit with cardiology 01/19/2020, felt to be stable.  Check a CBC High cholesterol: On Lipitor, check FLP, adjust treatment based on results Dyspepsia: On PPIs every other day, symptoms remain well controlled Will RTC for a A1c, BMP, CBC, FLP in few days RTC for a visit in 3 months   This visit occurred during the SARS-CoV-2 public health emergency.  Safety protocols were in place, including screening questions prior to the visit, additional usage of staff PPE, and extensive cleaning of exam room while observing appropriate contact time as indicated for disinfecting solutions.

## 2020-01-28 NOTE — Patient Instructions (Addendum)
Please schedule Medicare Wellness with Glenard Haring.   GO TO THE FRONT DESK Come back for blood work only in 10 days, please make an appointment When you come, please tell the nurse how your blood pressures are doing  Come back for a routine visit in 3 months to see me  Increase Lotensin 10 mg to two tablets a day, okay to take them together  Continue checking your blood pressures daily BP GOAL is between 110/65 and  135/85. If it is consistently higher or lower, let me know

## 2020-01-29 NOTE — Assessment & Plan Note (Signed)
DM: Last A1c 6.9, is doing okay with lifestyle, on no medications.  Recheck A1c, consider a SGLT2s HTN: Currently on Lotensin, Lasix, metoprolol.  Ambulatory BPs mostly in the 150s and 160s.  Increase Lotensin to 20 mg, BMP in 10 days, monitor BPs. CAD, atrial fibrillation, CHF: Last visit with cardiology 01/19/2020, felt to be stable.  Check a CBC High cholesterol: On Lipitor, check FLP, adjust treatment based on results Dyspepsia: On PPIs every other day, symptoms remain well controlled Will RTC for a A1c, BMP, CBC, FLP in few days RTC for a visit in 3 months

## 2020-02-07 ENCOUNTER — Other Ambulatory Visit (INDEPENDENT_AMBULATORY_CARE_PROVIDER_SITE_OTHER): Payer: Medicare Other

## 2020-02-07 ENCOUNTER — Telehealth: Payer: Self-pay

## 2020-02-07 ENCOUNTER — Other Ambulatory Visit: Payer: Self-pay

## 2020-02-07 DIAGNOSIS — I4891 Unspecified atrial fibrillation: Secondary | ICD-10-CM | POA: Diagnosis not present

## 2020-02-07 DIAGNOSIS — I10 Essential (primary) hypertension: Secondary | ICD-10-CM

## 2020-02-07 DIAGNOSIS — E7800 Pure hypercholesterolemia, unspecified: Secondary | ICD-10-CM | POA: Diagnosis not present

## 2020-02-07 DIAGNOSIS — E1159 Type 2 diabetes mellitus with other circulatory complications: Secondary | ICD-10-CM | POA: Diagnosis not present

## 2020-02-07 LAB — COMPREHENSIVE METABOLIC PANEL
ALT: 18 U/L (ref 0–53)
AST: 19 U/L (ref 0–37)
Albumin: 4.1 g/dL (ref 3.5–5.2)
Alkaline Phosphatase: 54 U/L (ref 39–117)
BUN: 16 mg/dL (ref 6–23)
CO2: 28 mEq/L (ref 19–32)
Calcium: 9.2 mg/dL (ref 8.4–10.5)
Chloride: 105 mEq/L (ref 96–112)
Creatinine, Ser: 1.62 mg/dL — ABNORMAL HIGH (ref 0.40–1.50)
GFR: 40.58 mL/min — ABNORMAL LOW (ref >60.00)
Glucose, Bld: 153 mg/dL — ABNORMAL HIGH (ref 70–99)
Potassium: 4.1 mEq/L (ref 3.5–5.1)
Sodium: 142 mEq/L (ref 135–145)
Total Bilirubin: 0.7 mg/dL (ref 0.2–1.2)
Total Protein: 6.8 g/dL (ref 6.0–8.3)

## 2020-02-07 LAB — CBC WITH DIFFERENTIAL/PLATELET
Basophils Absolute: 0 10*3/uL (ref 0.0–0.1)
Basophils Relative: 0.5 % (ref 0.0–3.0)
Eosinophils Absolute: 0.2 10*3/uL (ref 0.0–0.7)
Eosinophils Relative: 3.3 % (ref 0.0–5.0)
HCT: 41.4 % (ref 39.0–52.0)
Hemoglobin: 14.1 g/dL (ref 13.0–17.0)
Lymphocytes Relative: 38.1 % (ref 12.0–46.0)
Lymphs Abs: 2.6 10*3/uL (ref 0.7–4.0)
MCHC: 34.1 g/dL (ref 30.0–36.0)
MCV: 96 fl (ref 78.0–100.0)
Monocytes Absolute: 0.5 10*3/uL (ref 0.1–1.0)
Monocytes Relative: 7.8 % (ref 3.0–12.0)
Neutro Abs: 3.4 10*3/uL (ref 1.4–7.7)
Neutrophils Relative %: 50.3 % (ref 43.0–77.0)
Platelets: 170 10*3/uL (ref 150.0–400.0)
RBC: 4.32 Mil/uL (ref 4.22–5.81)
RDW: 13.9 % (ref 11.5–15.5)
WBC: 6.8 10*3/uL (ref 4.0–10.5)

## 2020-02-07 LAB — LIPID PANEL
Cholesterol: 107 mg/dL (ref 0–200)
HDL: 25.5 mg/dL — ABNORMAL LOW (ref >39.00)
NonHDL: 81.05
Total CHOL/HDL Ratio: 4
Triglycerides: 253 mg/dL — ABNORMAL HIGH (ref 0.0–149.0)
VLDL: 50.6 mg/dL — ABNORMAL HIGH (ref 0.0–40.0)

## 2020-02-07 LAB — HEMOGLOBIN A1C: Hgb A1c MFr Bld: 6.8 % — ABNORMAL HIGH (ref 4.6–6.5)

## 2020-02-07 LAB — LDL CHOLESTEROL, DIRECT: Direct LDL: 46 mg/dL

## 2020-02-07 NOTE — Telephone Encounter (Signed)
Lotensin was increased to 20 mg, BPs are now overall improving.  Labs satisfactory.  See lab results.

## 2020-02-07 NOTE — Telephone Encounter (Signed)
Pt dropped off BP's at front desk. BP readings placed in PCP red folder for review.

## 2020-02-10 ENCOUNTER — Telehealth: Payer: Self-pay | Admitting: Internal Medicine

## 2020-02-10 MED ORDER — BENAZEPRIL HCL 10 MG PO TABS: 20.0000 mg | ORAL_TABLET | Freq: Every day | ORAL | 1 refills | Status: DC

## 2020-02-10 NOTE — Telephone Encounter (Signed)
Medication: benazepril (LOTENSIN) 10 MG tablet [id number] Patient states need 20mg    Has the patient contacted their pharmacy? No. (If no, request that the patient contact the pharmacy for the refill.) (If yes, when and what did the pharmacy advise?)  Preferred Pharmacy (with phone number or street name): [id number] - [place], [place] Phone:  [phone]  Fax:  [phone]       Agent: Please be advised that RX refills may take up to 3 business days. We ask that you follow-up with your pharmacy.

## 2020-02-10 NOTE — Telephone Encounter (Signed)
Pt on benazepril 10mg  2 tablets daily. Rx sent.

## 2020-02-14 ENCOUNTER — Encounter: Payer: Self-pay | Admitting: Internal Medicine

## 2020-02-26 ENCOUNTER — Other Ambulatory Visit: Payer: Self-pay | Admitting: Internal Medicine

## 2020-03-23 DIAGNOSIS — C44222 Squamous cell carcinoma of skin of right ear and external auricular canal: Secondary | ICD-10-CM | POA: Diagnosis not present

## 2020-04-05 ENCOUNTER — Telehealth: Payer: Self-pay | Admitting: Internal Medicine

## 2020-04-05 NOTE — Progress Notes (Signed)
  Chronic Care Management   Outreach Note  04/05/2020 Name: [name] MRN: [id number] DOB: 24-Sep-1931  Referred by: Colon Branch, MD Reason for referral : No chief complaint on file.   An unsuccessful telephone outreach was attempted today. The patient was referred to the pharmacist for assistance with care management and care coordination.    This note is not being shared with the patient for the following reason: To respect privacy (The patient or proxy has requested that the information not be shared).  Follow Up Plan:   Raynicia Dukes [employer] Scheduler

## 2020-04-07 NOTE — Progress Notes (Signed)
This visit is being conducted via phone call  - after an attmept to do on video chat - due to the COVID-19 pandemic. This patient has given me verbal consent via phone to conduct this visit, patient states they are participating from their home address. Some vital signs may be absent or patient reported.   Patient identification: identified by name, DOB, and current address.    Subjective:   MONTANA OTTMAR is a 86 y.o. male who presents for Medicare Annual/Subsequent preventive examination.  Plays golf 3 days per week. Gym 3 days per week.  Review of Systems:  Home Safety/Smoke Alarms: Feels safe in home. Smoke alarms in place.  Lives in 2 story home.  Does well w/ stairs.   Male:     PSA-  Lab Results  Component Value Date   PSA 2.70 11/27/2018   PSA 2.25 07/15/2016   PSA 1.18 04/19/2008       Objective:    Vitals: Unable to assess. This visit is enabled though telemedicine due to Covid 19.   Advanced Directives 04/10/2020 02/14/2015 08/22/2014 08/20/2012 08/14/2012  Does Patient Have a Medical Advance Directive? No No No Patient does not have advance directive;Patient would not like information Patient does not have advance directive;Patient would not like information  Would patient like information on creating a medical advance directive? No - Patient declined - No - patient declined information - -  Pre-existing out of facility DNR order (yellow form or pink MOST form) - - - No -    Tobacco Social History   Tobacco Use  Smoking Status Former Smoker  . Years: 16.00  . Quit date: 08/14/1964  . Years since quitting: 55.6  Smokeless Tobacco Never Used  Tobacco Comment   quit 1965     Counseling given: Not Answered Comment: quit 1965   Clinical Intake: Pain : No/denies pain     Past Medical History:  Diagnosis Date  . Anemia   . Antral gastritis 2013   EGD   . Atrial fibrillation ([place])   . CAD (coronary artery disease)    s/p CABG 1993 (L-LAD, S-RI, S-D1);   echo  1/10: EF 55%;    myoview 12/09: inf MI, no ischemia  . Candida esophagitis ([place]) 2013   EGD   . Cataracts, bilateral   . Family history of malignant neoplasm of gastrointestinal tract   . Folliculitis   . Hiatal hernia   . HTN (hypertension)   . Hyperlipidemia   . Irritable bladder   . Myocardial infarction ([place])   . Obesity   . Osteoarthritis    Past Surgical History:  Procedure Laterality Date  . CATARACT EXTRACTION Right 07/21/2018  . CHOLECYSTECTOMY     laparoscopic '92  . CORONARY ARTERY BYPASS GRAFT     graft '92: LIMA-LAD, SVG - D2,R1, D1  . ENTEROSCOPY  08/22/2012   Procedure: ENTEROSCOPY;  Surgeon: Juanita Craver, MD;  Location: [facility];  Service: Endoscopy;  Laterality: N/A;  . INTRAOCULAR LENS EXCHANGE Right 07/21/2018  . KNEE SURGERY    . MOLE REMOVAL  2001 and 2008  . PILONIDAL CYST EXCISION     Family History  Problem Relation Age of Onset  . Coronary artery disease Father        and brother  . Heart attack Father        and brother-fatal  . Stroke Father   . Breast cancer Mother   . Alzheimer's disease Mother   . Colon cancer Maternal Uncle   .  Esophageal cancer Neg Hx   . Stomach cancer Neg Hx   . Rectal cancer Neg Hx   . Prostate cancer Neg Hx    Social History   Socioeconomic History  . Marital status: Widowed    Spouse name: Not on file  . Number of children: 1  . Years of education: Not on file  . Highest education level: Not on file  Occupational History  . Occupation: Retired    Fish farm manager: RETIRED  Tobacco Use  . Smoking status: Former Smoker    Years: 16.00    Quit date: 08/14/1964    Years since quitting: 55.6  . Smokeless tobacco: Never Used  . Tobacco comment: quit 1965  Substance and Sexual Activity  . Alcohol use: Yes  . Drug use: No  . Sexual activity: Not on file  Other Topics Concern  . 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 Married '57- widowed 02-09-23. 1 son -'52- died '20 MVA.     Lives by himself    Emergency contact: see DPR, brother and  Daron Offer [phone] (friend)   Social Determinants of Health   Financial Resource Strain:   . Difficulty of Paying Living Expenses:   Food Insecurity:   . Worried About Charity fundraiser in the Last Year:   . Arboriculturist in the Last Year:   Transportation Needs:   . Film/video editor (Medical):   Marland Kitchen Lack of Transportation (Non-Medical):   Physical Activity:   . Days of Exercise per Week:   . Minutes of Exercise per Session:   Stress:   . Feeling of Stress :   Social Connections:   . Frequency of Communication with Friends and Family:   . Frequency of Social Gatherings with Friends and Family:   . Attends Religious Services:   . Active Member of Clubs or Organizations:   . Attends Archivist Meetings:   Marland Kitchen Marital Status:     Outpatient Encounter Medications as of 04/10/2020  Medication Sig  . atorvastatin (LIPITOR) 80 MG tablet Take 1 tablet (80 mg total) by mouth daily.  . benazepril (LOTENSIN) 10 MG tablet Take 2 tablets (20 mg total) by mouth daily.  . Cholecalciferol (VITAMIN D) 1000 UNITS capsule Take 1,000 Units by mouth daily.    . Coenzyme Q10 (COQ10 PO) Take 1 capsule by mouth daily.  . diclofenac sodium (VOLTAREN) 1 % GEL Apply 4 g topically 4 (four) times daily. To affected joint.  . furosemide (LASIX) 40 MG tablet Take 1 tablet (40 mg total) by mouth daily.  . Melatonin 5 MG TABS Take 5 mg by mouth at bedtime.  . metoprolol succinate (TOPROL-XL) 25 MG 24 hr tablet Take 1 tablet (25 mg total) by mouth daily.  . Multiple Vitamin (MULTIVITAMIN WITH MINERALS) TABS tablet Take 1 tablet by mouth daily.  . pantoprazole (PROTONIX) 40 MG tablet Take 1 tablet (40 mg total) by mouth daily.  . Rivaroxaban (XARELTO) 15 MG TABS tablet Take 1 tablet (15 mg total) by mouth daily with supper. (Patient taking differently: Take 20 mg by mouth daily with supper. )  . tamsulosin (FLOMAX) 0.4 MG CAPS  capsule Take 1 capsule (0.4 mg total) by mouth daily.  . vitamin B-12 (CYANOCOBALAMIN) 1000 MCG tablet Take 1,000 mcg by mouth daily.  . diphenhydramine-acetaminophen (TYLENOL PM) 25-500 MG TABS tablet Take 2 tablets by mouth at bedtime.  No facility-administered encounter medications on file as of 04/10/2020.    Activities of Daily Living In your present state of health, do you have any difficulty performing the following activities: 04/10/2020 07/28/2019  Hearing? N N  Vision? N N  Difficulty concentrating or making decisions? N N  Walking or climbing stairs? N N  Dressing or bathing? N N  Doing errands, shopping? N N  Preparing Food and eating ? N -  Using the Toilet? N -  In the past six months, have you accidently leaked urine? N -  Do you have problems with loss of bowel control? N -  Managing your Medications? N -  Managing your Finances? N -  Housekeeping or managing your Housekeeping? N -  Some recent data might be hidden    Patient Care Team: Colon Branch, MD as PCP - General (Internal Medicine) Stanford Breed, Denice Bors, MD as Consulting Physician (Cardiology) Juanita Craver, MD as Consulting Physician (Gastroenterology) Earlie Server, MD as Consulting Physician (Orthopedic Surgery) Wilford Corner, MD as Consulting Physician (Ophthalmology) Melina Schools, MD as Consulting Physician (Orthopedic Surgery) Leonia Corona, MD as Referring Physician (Ophthalmology)   Assessment:   This is a routine wellness examination for Forrester. Physical assessment deferred to PCP.  Exercise Activities and Dietary recommendations Current Exercise Habits: Home exercise routine, Type of exercise: strength training/weights, Time (Minutes): 60, Frequency (Times/Week): 3, Weekly Exercise (Minutes/Week): 180, Intensity: Mild, Exercise limited by: None identified Diet (meal preparation, eat out, water intake, caffeinated beverages, dairy products, fruits and vegetables): well balanced  Goals    .  Maintain healthy active lifestyle.       Fall Risk Fall Risk  04/10/2020 07/28/2019 02/24/2019 12/30/2017 12/16/2016  Falls in the past year? 0 0 0 Yes No  Number falls in past yr: 0 - - 1 -  Injury with Fall? 0 - - Yes -  Risk Factor Category  - - - High Fall Risk -  Follow up Education provided;Falls prevention discussed - - Education provided;Falls prevention discussed;Falls evaluation completed -    Depression Screen PHQ 2/9 Scores 04/10/2020 07/28/2019 02/24/2019 12/30/2017  PHQ - 2 Score 0 0 0 0    Cognitive Function Ad8 score reviewed for issues:  Issues making decisions:no  Less interest in hobbies / activities:no  Repeats questions, stories (family complaining):no  Trouble using ordinary gadgets (microwave, computer, phone):no  Forgets the month or year: no  Mismanaging finances: no  Remembering appts:no  Daily problems with thinking and/or memory:no Ad8 score is=0         Immunization History  Administered Date(s) Administered  . Influenza Split 08/27/2012, 09/06/2014, 09/09/2018  . Influenza Whole 09/04/2009  . Influenza, High Dose Seasonal PF 09/07/2019  . Influenza-Unspecified 09/25/2015, 09/09/2016, 09/08/2017  . PFIZER SARS-COV-2 Vaccination 12/30/2019, 01/17/2020  . Pneumococcal Conjugate-13 04/05/2014  . Pneumococcal Polysaccharide-23 01/07/2012  . Td 10/20/2009  . Tdap 03/17/2012, 02/14/2015  . Zoster 01/17/2015  . Zoster Recombinat (Shingrix) 10/27/2018, 12/26/2018    Screening Tests Health Maintenance  Topic Date Due  . OPHTHALMOLOGY EXAM  05/16/2020  . INFLUENZA VACCINE  07/09/2020  . HEMOGLOBIN A1C  08/09/2020  . FOOT EXAM  01/16/2021  . TETANUS/TDAP  02/13/2025  . COVID-19 Vaccine  Completed  . PNA vac Low Risk Adult  Completed     Plan:    Please schedule your next medicare wellness visit with me in 1 yr.  Continue to eat heart healthy diet (full of fruits, vegetables, whole grains, lean protein, water--limit salt, fat,  and sugar  intake) and increase physical activity as tolerated.  Continue doing brain stimulating activities (puzzles, reading, adult coloring books, staying active) to keep memory sharp.   Bring a copy of your living will and/or healthcare power of attorney to your next office visit.    I have personally reviewed and noted the following in the patient's chart:   . Medical and social history . Use of alcohol, tobacco or illicit drugs  . Current medications and supplements . Functional ability and status . Nutritional status . Physical activity . Advanced directives . List of other physicians . Hospitalizations, surgeries, and ER visits in previous 12 months . Vitals . Screenings to include cognitive, depression, and falls . Referrals and appointments  In addition, I have reviewed and discussed with patient certain preventive protocols, quality metrics, and best practice recommendations. A written personalized care plan for preventive services as well as general preventive health recommendations were provided to patient.     Shela Nevin, [place]  04/10/2020

## 2020-04-10 ENCOUNTER — Encounter: Payer: Self-pay | Admitting: *Deleted

## 2020-04-10 ENCOUNTER — Other Ambulatory Visit: Payer: Self-pay

## 2020-04-10 ENCOUNTER — Ambulatory Visit (INDEPENDENT_AMBULATORY_CARE_PROVIDER_SITE_OTHER): Payer: Medicare Other | Admitting: *Deleted

## 2020-04-10 DIAGNOSIS — Z0000 Encounter for general adult medical examination without abnormal findings: Secondary | ICD-10-CM | POA: Diagnosis not present

## 2020-04-10 NOTE — Patient Instructions (Signed)
Please schedule your next medicare wellness visit with me in 1 yr.  Continue to eat heart healthy diet (full of fruits, vegetables, whole grains, lean protein, water--limit salt, fat, and sugar intake) and increase physical activity as tolerated.  Continue doing brain stimulating activities (puzzles, reading, adult coloring books, staying active) to keep memory sharp.   Bring a copy of your living will and/or healthcare power of attorney to your next office visit.   [name] , Thank you for taking time to come for your Medicare Wellness Visit. I appreciate your ongoing commitment to your health goals. Please review the following plan we discussed and let me know if I can assist you in the future.   These are the goals we discussed: Goals    . Maintain healthy active lifestyle.       This is a list of the screening recommended for you and due dates:  Health Maintenance  Topic Date Due  . Eye exam for diabetics  05/16/2020  . Flu Shot  07/09/2020  . Hemoglobin A1C  08/09/2020  . Complete foot exam   01/16/2021  . Tetanus Vaccine  02/13/2025  . COVID-19 Vaccine  Completed  . Pneumonia vaccines  Completed    Preventive Care 68 Years and Older, Male Preventive care refers to lifestyle choices and visits with your health care provider that can promote health and wellness. This includes:  A yearly physical exam. This is also called an annual well check.  Regular dental and eye exams.  Immunizations.  Screening for certain conditions.  Healthy lifestyle choices, such as diet and exercise. What can I expect for my preventive care visit? Physical exam Your health care provider will check:  Height and weight. These may be used to calculate body mass index (BMI), which is a measurement that tells if you are at a healthy weight.  Heart rate and blood pressure.  Your skin for abnormal spots. Counseling Your health care provider may ask you questions about:  Alcohol, tobacco,  and drug use.  Emotional well-being.  Home and relationship well-being.  Sexual activity.  Eating habits.  History of falls.  Memory and ability to understand (cognition).  Work and work Statistician. What immunizations do I need?  Influenza (flu) vaccine  This is recommended every year. Tetanus, diphtheria, and pertussis (Tdap) vaccine  You may need a Td booster every 10 years. Varicella (chickenpox) vaccine  You may need this vaccine if you have not already been vaccinated. Zoster (shingles) vaccine  You may need this after age 72. Pneumococcal conjugate (PCV13) vaccine  One dose is recommended after age 64. Pneumococcal polysaccharide (PPSV23) vaccine  One dose is recommended after age 37. Measles, mumps, and rubella (MMR) vaccine  You may need at least one dose of MMR if you were born in 1957 or later. You may also need a second dose. Meningococcal conjugate (MenACWY) vaccine  You may need this if you have certain conditions. Hepatitis A vaccine  You may need this if you have certain conditions or if you travel or work in places where you may be exposed to hepatitis A. Hepatitis B vaccine  You may need this if you have certain conditions or if you travel or work in places where you may be exposed to hepatitis B. Haemophilus influenzae type b (Hib) vaccine  You may need this if you have certain conditions. You may receive vaccines as individual doses or as more than one vaccine together in one shot (combination vaccines). Talk with  your health care provider about the risks and benefits of combination vaccines. What tests do I need? Blood tests  Lipid and cholesterol levels. These may be checked every 5 years, or more frequently depending on your overall health.  Hepatitis C test.  Hepatitis B test. Screening  Lung cancer screening. You may have this screening every year starting at age 84 if you have a 30-pack-year history of smoking and currently smoke  or have quit within the past 15 years.  Colorectal cancer screening. All adults should have this screening starting at age 29 and continuing until age 42. Your health care provider may recommend screening at age 109 if you are at increased risk. You will have tests every 1-10 years, depending on your results and the type of screening test.  Prostate cancer screening. Recommendations will vary depending on your family history and other risks.  Diabetes screening. This is done by checking your blood sugar (glucose) after you have not eaten for a while (fasting). You may have this done every 1-3 years.  Abdominal aortic aneurysm (AAA) screening. You may need this if you are a current or former smoker.  Sexually transmitted disease (STD) testing. Follow these instructions at home: Eating and drinking  Eat a diet that includes fresh fruits and vegetables, whole grains, lean protein, and low-fat dairy products. Limit your intake of foods with high amounts of sugar, saturated fats, and salt.  Take vitamin and mineral supplements as recommended by your health care provider.  Do not drink alcohol if your health care provider tells you not to drink.  If you drink alcohol: ? Limit how much you have to 0-2 drinks a day. ? Be aware of how much alcohol is in your drink. In the [place] one drink equals one 12 oz bottle of beer (355 mL), one 5 oz glass of wine (148 mL), or one 1 oz glass of hard liquor (44 mL). Lifestyle  Take daily care of your teeth and gums.  Stay active. Exercise for at least 30 minutes on 5 or more days each week.  Do not use any products that contain nicotine or tobacco, such as cigarettes, e-cigarettes, and chewing tobacco. If you need help quitting, ask your health care provider.  If you are sexually active, practice safe sex. Use a condom or other form of protection to prevent STIs (sexually transmitted infections).  Talk with your health care provider about taking a low-dose  aspirin or statin. What's next?  Visit your health care provider once a year for a well check visit.  Ask your health care provider how often you should have your eyes and teeth checked.  Stay up to date on all vaccines. This information is not intended to replace advice given to you by your health care provider. Make sure you discuss any questions you have with your health care provider. Document Revised: 11/19/2018 Document Reviewed: 11/19/2018 Elsevier Patient Education  2020 [employer].

## 2020-04-18 DIAGNOSIS — C44222 Squamous cell carcinoma of skin of right ear and external auricular canal: Secondary | ICD-10-CM | POA: Diagnosis not present

## 2020-04-20 ENCOUNTER — Emergency Department (INDEPENDENT_AMBULATORY_CARE_PROVIDER_SITE_OTHER): Payer: Medicare Other

## 2020-04-20 ENCOUNTER — Other Ambulatory Visit: Payer: Self-pay

## 2020-04-20 ENCOUNTER — Emergency Department
Admission: EM | Admit: 2020-04-20 | Discharge: 2020-04-20 | Disposition: A | Discharge status: Home / Self Care | Payer: Medicare Other | Source: Home / Self Care | Attending: Family Medicine | Admitting: Family Medicine

## 2020-04-20 DIAGNOSIS — S7011XA Contusion of right thigh, initial encounter: Secondary | ICD-10-CM

## 2020-04-20 DIAGNOSIS — D649 Anemia, unspecified: Secondary | ICD-10-CM | POA: Diagnosis not present

## 2020-04-20 DIAGNOSIS — M7989 Other specified soft tissue disorders: Secondary | ICD-10-CM

## 2020-04-20 DIAGNOSIS — M25551 Pain in right hip: Secondary | ICD-10-CM | POA: Diagnosis not present

## 2020-04-20 LAB — POCT CBC W AUTO DIFF (K'VILLE URGENT CARE)

## 2020-04-20 NOTE — ED Triage Notes (Signed)
Patient presents to Urgent Care with complaints of right hip pain since earlier this morning. Patient reports he started rubbing his right hip and it has gotten progressively more tender to the touch. Pt denies injury or fall.

## 2020-04-20 NOTE — ED Provider Notes (Signed)
[name]    CSN: [id number] Arrival date & time: 04/20/20  1307      History   Chief Complaint Chief Complaint  Patient presents with  . Hip Pain    HPI [name] is a 86 y.o. male.   The history is provided by the patient.   Patient complains of onset of pain in his right anterior thigh this morning. He recalls no injury or change in activities.  The history is provided by the patient.  Leg Pain  Location: Leg  Time since incident: 7 hours Injury: no  Leg location: R upper leg  Pain details:  Quality: Aching  Radiates to: Does not radiate  Severity: Mild  Onset quality: Sudden  Duration: 7 hours  Timing: Constant  Progression: Unchanged  Chronicity: New  Prior injury to area: No  Relieved by: None tried  Worsened by: Flexion and activity  Ineffective treatments: None tried Associated symptoms: decreased ROM and swelling  Associated symptoms: no fatigue, no fever, no muscle weakness and no numbness   Past Medical History:  Diagnosis Date  . Anemia   . Antral gastritis 2013   EGD   . Atrial fibrillation ([place])   . CAD (coronary artery disease)    s/p CABG 1993 (L-LAD, S-RI, S-D1);   echo 1/10: EF 55%;    myoview 12/09: inf MI, no ischemia  . Candida esophagitis ([place]) 2013   EGD   . Cataracts, bilateral   . Family history of malignant neoplasm of gastrointestinal tract   . Folliculitis   . Hiatal hernia   . HTN (hypertension)   . Hyperlipidemia   . Irritable bladder   . Myocardial infarction ([place])   . Obesity   . Osteoarthritis     Patient Active Problem List   Diagnosis Date Noted  . Left hip pain 10/18/2019  . Lumbar spinal stenosis 04/16/2019  . CHF (congestive heart failure) ([place]) 12/17/2016  . Arthritis of left ankle 10/15/2016  . PCP NOTES >>>>> 10/11/2015  . Carpal tunnel syndrome 02/15/2014  . Anemia 08/14/2012  . Annual physical exam 01/08/2012  . Atrial fibrillation ([place]) 03/29/2011  . BRADYCARDIA 06/19/2010  .  Venous (peripheral) insufficiency 09/04/2009  . PALPITATIONS 07/07/2009  . OBESITY 07/04/2009  . DJD (degenerative joint disease) 07/04/2009  . SLEEP DISORDER 07/04/2009  . Dyspepsia 02/28/2009  . HEMATOCHEZIA 02/07/2009  . DM II (diabetes mellitus, type II), controlled ([place]) 04/25/2008  . Hyperlipidemia 06/25/2007  . Essential hypertension 06/25/2007  . Coronary atherosclerosis 06/25/2007    Past Surgical History:  Procedure Laterality Date  . CATARACT EXTRACTION Right 07/21/2018  . CHOLECYSTECTOMY     laparoscopic '92  . CORONARY ARTERY BYPASS GRAFT     graft '92: LIMA-LAD, SVG - D2,R1, D1  . ENTEROSCOPY  08/22/2012   Procedure: ENTEROSCOPY;  Surgeon: Juanita Craver, MD;  Location: [facility];  Service: Endoscopy;  Laterality: N/A;  . INTRAOCULAR LENS EXCHANGE Right 07/21/2018  . KNEE SURGERY    . MOLE REMOVAL  2001 and 2008  . PILONIDAL CYST EXCISION         Home Medications    Prior to Admission medications   Medication Sig Start Date End Date Taking? Authorizing Provider  atorvastatin (LIPITOR) 80 MG tablet Take 1 tablet (80 mg total) by mouth daily. 08/31/19   Lelon Perla, MD  benazepril (LOTENSIN) 10 MG tablet Take 2 tablets (20 mg total) by mouth daily. 02/10/20   Colon Branch, MD  Cholecalciferol (VITAMIN D) 1000  UNITS capsule Take 1,000 Units by mouth daily.      [provider]  Coenzyme Q10 (COQ10 PO) Take 1 capsule by mouth daily.    [provider]  diclofenac sodium (VOLTAREN) 1 % GEL Apply 4 g topically 4 (four) times daily. To affected joint. 09/09/19   Gregor Hams, MD  diphenhydramine-acetaminophen (TYLENOL PM) 25-500 MG TABS tablet Take 2 tablets by mouth at bedtime.    [provider]  furosemide (LASIX) 40 MG tablet Take 1 tablet (40 mg total) by mouth daily. 02/28/20   Colon Branch, MD  Melatonin 5 MG TABS Take 5 mg by mouth at bedtime.    [provider]  metoprolol succinate (TOPROL-XL) 25 MG 24 hr tablet Take 1  tablet (25 mg total) by mouth daily. 07/28/19   Colon Branch, MD  Multiple Vitamin (MULTIVITAMIN WITH MINERALS) TABS tablet Take 1 tablet by mouth daily.    [provider]  pantoprazole (PROTONIX) 40 MG tablet Take 1 tablet (40 mg total) by mouth daily. 06/01/19   Colon Branch, MD  Rivaroxaban (XARELTO) 15 MG TABS tablet Take 1 tablet (15 mg total) by mouth daily with supper. Patient taking differently: Take 20 mg by mouth daily with supper.  07/28/19   Colon Branch, MD  tamsulosin (FLOMAX) 0.4 MG CAPS capsule Take 1 capsule (0.4 mg total) by mouth daily. 11/29/19   Colon Branch, MD  vitamin B-12 (CYANOCOBALAMIN) 1000 MCG tablet Take 1,000 mcg by mouth daily.    [provider]    Family History Family History  Problem Relation Age of Onset  . Coronary artery disease Father        and brother  . Heart attack Father        and brother-fatal  . Stroke Father   . Breast cancer Mother   . Alzheimer's disease Mother   . Colon cancer Maternal Uncle   . Esophageal cancer Neg Hx   . Stomach cancer Neg Hx   . Rectal cancer Neg Hx   . Prostate cancer Neg Hx     Social History Social History   Tobacco Use  . Smoking status: Former Smoker    Years: 16.00    Quit date: 08/14/1964    Years since quitting: 55.7  . Smokeless tobacco: Never Used  . Tobacco comment: quit 1965  Substance Use Topics  . Alcohol use: Yes    Alcohol/week: 7.0 standard drinks    Types: 7 Shots of liquor per week    Comment: brandy at night  . Drug use: No     Allergies   Patient has no known allergies.   Review of Systems Review of Systems  Constitutional: Positive for activity change. Negative for appetite change, chills, diaphoresis, fatigue and fever.  HENT: Negative.   Eyes: Negative.   Respiratory: Negative for cough, chest tightness, shortness of breath, wheezing and stridor.   Cardiovascular: Positive for leg swelling. Negative for chest pain.  Gastrointestinal: Negative.     Genitourinary: Negative.   Musculoskeletal:       RIght thigh pain  Skin: Positive for color change.  Neurological: Negative.   All other systems reviewed and are negative.    Physical Exam Triage Vital Signs ED Triage Vitals  Enc Vitals Group     BP 04/20/20 1322 (!) 142/77     Pulse Rate 04/20/20 1322 69     Resp 04/20/20 1322 19     Temp 04/20/20 1322 97.8  F (36.6 C)     Temp Source 04/20/20 1322 Oral     SpO2 04/20/20 1322 96 %     Weight --      Height --      Head Circumference --      Peak Flow --      Pain Score 04/20/20 1320 8     Pain Loc --      Pain Edu? --      Excl. in [place]? --    No data found.  Updated Vital Signs BP (!) 142/77 (BP Location: Right Arm)   Pulse 69   Temp 97.8 F (36.6 C) (Oral)   Resp 19   SpO2 96%   Visual Acuity Right Eye Distance:   Left Eye Distance:   Bilateral Distance:    Right Eye Near:   Left Eye Near:    Bilateral Near:     Physical Exam Vitals and nursing note reviewed.  Constitutional:      General: He is not in acute distress. HENT:     Head: Normocephalic.     Nose: Nose normal.     Mouth/Throat:     Pharynx: Oropharynx is clear.  Eyes:     Conjunctiva/sclera: Conjunctivae normal.     Pupils: Pupils are equal, round, and reactive to light.  Cardiovascular:     Rate and Rhythm: Normal rate.     Heart sounds: Normal heart sounds.  Pulmonary:     Breath sounds: Normal breath sounds.  Abdominal:     Palpations: Abdomen is soft.     Tenderness: There is no abdominal tenderness.  Musculoskeletal:        General: Swelling and tenderness present.     Cervical back: Neck supple.     Right lower leg: Swelling and tenderness present. No edema.     Left lower leg: No edema.       Legs:     Comments: Right anterior proximal thigh has swelling and tenderness to palpation as noted on diagram.  No erythema or warmth. There is faint ecchymosis present.  No posterior thigh or lower leg calf tenderness.  Distal  pulses intact.  Skin:    General: Skin is warm and dry.     Findings: No rash.  Neurological:     General: No focal deficit present.     Mental Status: He is alert.      UC Treatments / Results  Labs (all labs ordered are listed, but only abnormal results are displayed) Labs Reviewed  POCT CBC W AUTO DIFF ([facility]):  WBC 7.5; LY 25.2; MO 3.8; GR 71.0; Hgb 12.8; Platelets 196     EKG   Radiology [place] RT LOWER EXTREM LTD SOFT TISSUE NON VASCULAR  Addendum Date: 04/20/2020   ADDENDUM REPORT: 04/20/2020 15:40 ADDENDUM: These results and differential considerations were discussed by telephone on 04/20/2020 at 3:40 pm to provider Theone Murdoch , who verbally acknowledged these results. Electronically Signed   By: Lovena Le M.D.   On: 04/20/2020 15:40   Result Date: 04/20/2020 CLINICAL DATA:  Swelling of the thigh/hip pain EXAM: ULTRASOUND RIGHT LOWER EXTREMITY LIMITED TECHNIQUE: Ultrasound examination of the lower extremity soft tissues was performed in the area of clinical concern. COMPARISON:  None. FINDINGS: Corresponding to the area of concern in the anterolateral right thigh is a large 7.7 x 3.7 x 5.0 cm heterogeneous, predominantly hypoechoic masslike structure suggesting a collection. No internal color Doppler flow is seen. Portions of this  collection may demonstrate displacement artifact as well as mixed areas of increased posterior through transmission and others of posterior shadowing. Likely due to the heterogeneity of the internal contents. Surrounding soft tissues are unremarkable IMPRESSION: Heterogeneous, avascular probable collection in the right anterolateral thigh measuring 7.7 x 3.7 x 5.0 cm. Findings may reflect a large hematoma versus heterogeneous seroma. Sterility of this collection is not ascertained on imaging. Recommend correlation with clinical history such as anticoagulant use and laboratory values as well as assessment for recent trauma. Electronically  Signed: By: Lovena Le M.D. On: 04/20/2020 15:36    Procedures Procedures (including critical care time)  Medications Ordered in UC Medications - No data to display  Initial Impression / Assessment and Plan / UC Course  I have reviewed the triage vital signs and the nursing notes.  Pertinent labs & imaging results that were available during my care of the patient were reviewed by me and considered in my medical decision making (see chart for details).    Hematoma right anterior thigh most likely occurring spontaneously as a result of anticoagulation with Xarelto.  No history of recent trauma.  Normal WBC reassuring. Note presence of anemia (Hgb 12.8)  Previous Hgb 14.1 documented on 02/07/20. Ace wrap applied. Followup with Dr. Larose Kells (scheduled 04/26/20).   Final Clinical Impressions(s) / UC Diagnoses   Final diagnoses:  Swelling of thigh  Hematoma of right thigh, initial encounter  Anemia, unspecified type     Discharge Instructions     Wear Ace wrap to minimize swelling.  May take Tylenol as needed for pain.  Apply ice pack 3 to 4 times daily for about 2 days.  If symptoms become significantly worse during the night or over the weekend, proceed to the local emergency room.     ED Prescriptions    None        Kandra Nicolas, MD 04/20/20 1926

## 2020-04-20 NOTE — Discharge Instructions (Addendum)
Wear Ace wrap to minimize swelling.  May take Tylenol as needed for pain.  Apply ice pack 3 to 4 times daily for about 2 days.  If symptoms become significantly worse during the night or over the weekend, proceed to the local emergency room.

## 2020-04-21 ENCOUNTER — Telehealth: Payer: Self-pay

## 2020-04-21 NOTE — Telephone Encounter (Signed)
Looks like he just has a hematoma, I will see him next week.

## 2020-04-21 NOTE — Telephone Encounter (Signed)
Shirlee Limerick called stating [name] was seen in the urgent care for hip pain. She wanted him to be evaluated for possible trama to his hip. [place] has a scheduled appointment with Dr Dianah Field next week. I advised her if his symptoms worsened before the appointment to take him back to the urgent care or the ED.

## 2020-04-22 ENCOUNTER — Encounter (HOSPITAL_BASED_OUTPATIENT_CLINIC_OR_DEPARTMENT_OTHER): Payer: Self-pay | Admitting: Emergency Medicine

## 2020-04-22 ENCOUNTER — Emergency Department (HOSPITAL_BASED_OUTPATIENT_CLINIC_OR_DEPARTMENT_OTHER): Payer: Medicare Other

## 2020-04-22 ENCOUNTER — Emergency Department (HOSPITAL_BASED_OUTPATIENT_CLINIC_OR_DEPARTMENT_OTHER)
Admission: EM | Admit: 2020-04-22 | Discharge: 2020-04-22 | Disposition: A | Discharge status: Home / Self Care | Payer: Medicare Other | Attending: Emergency Medicine | Admitting: Emergency Medicine

## 2020-04-22 ENCOUNTER — Other Ambulatory Visit: Payer: Self-pay

## 2020-04-22 DIAGNOSIS — R195 Other fecal abnormalities: Secondary | ICD-10-CM | POA: Insufficient documentation

## 2020-04-22 DIAGNOSIS — R233 Spontaneous ecchymoses: Secondary | ICD-10-CM | POA: Diagnosis not present

## 2020-04-22 DIAGNOSIS — K921 Melena: Secondary | ICD-10-CM | POA: Diagnosis not present

## 2020-04-22 DIAGNOSIS — M25551 Pain in right hip: Secondary | ICD-10-CM | POA: Diagnosis not present

## 2020-04-22 DIAGNOSIS — R791 Abnormal coagulation profile: Secondary | ICD-10-CM | POA: Diagnosis not present

## 2020-04-22 DIAGNOSIS — I2510 Atherosclerotic heart disease of native coronary artery without angina pectoris: Secondary | ICD-10-CM | POA: Diagnosis not present

## 2020-04-22 DIAGNOSIS — E785 Hyperlipidemia, unspecified: Secondary | ICD-10-CM | POA: Insufficient documentation

## 2020-04-22 DIAGNOSIS — S3991XA Unspecified injury of abdomen, initial encounter: Secondary | ICD-10-CM | POA: Diagnosis not present

## 2020-04-22 DIAGNOSIS — Z951 Presence of aortocoronary bypass graft: Secondary | ICD-10-CM | POA: Diagnosis not present

## 2020-04-22 DIAGNOSIS — Z7901 Long term (current) use of anticoagulants: Secondary | ICD-10-CM

## 2020-04-22 DIAGNOSIS — Z87891 Personal history of nicotine dependence: Secondary | ICD-10-CM | POA: Insufficient documentation

## 2020-04-22 DIAGNOSIS — I4891 Unspecified atrial fibrillation: Secondary | ICD-10-CM | POA: Insufficient documentation

## 2020-04-22 DIAGNOSIS — Z79899 Other long term (current) drug therapy: Secondary | ICD-10-CM | POA: Diagnosis not present

## 2020-04-22 DIAGNOSIS — I10 Essential (primary) hypertension: Secondary | ICD-10-CM | POA: Insufficient documentation

## 2020-04-22 DIAGNOSIS — S7001XA Contusion of right hip, initial encounter: Secondary | ICD-10-CM

## 2020-04-22 LAB — CBC WITH DIFFERENTIAL/PLATELET
Abs Immature Granulocytes: 0.02 10*3/uL (ref 0.00–0.07)
Basophils Absolute: 0 10*3/uL (ref 0.0–0.1)
Basophils Relative: 1 %
Eosinophils Absolute: 0.2 10*3/uL (ref 0.0–0.5)
Eosinophils Relative: 3 %
HCT: 39.1 % (ref 39.0–52.0)
Hemoglobin: 13.5 g/dL (ref 13.0–17.0)
Immature Granulocytes: 0 %
Lymphocytes Relative: 31 %
Lymphs Abs: 2 10*3/uL (ref 0.7–4.0)
MCH: 32.8 pg (ref 26.0–34.0)
MCHC: 34.5 g/dL (ref 30.0–36.0)
MCV: 95.1 fL (ref 80.0–100.0)
Monocytes Absolute: 0.6 10*3/uL (ref 0.1–1.0)
Monocytes Relative: 10 %
Neutro Abs: 3.7 10*3/uL (ref 1.7–7.7)
Neutrophils Relative %: 55 %
Platelets: 178 10*3/uL (ref 150–400)
RBC: 4.11 MIL/uL — ABNORMAL LOW (ref 4.22–5.81)
RDW: 13.2 % (ref 11.5–15.5)
WBC: 6.6 10*3/uL (ref 4.0–10.5)
nRBC: 0 % (ref 0.0–0.2)

## 2020-04-22 LAB — BASIC METABOLIC PANEL
Anion gap: 12 (ref 5–15)
BUN: 22 mg/dL (ref 8–23)
CO2: 24 mmol/L (ref 22–32)
Calcium: 9.2 mg/dL (ref 8.9–10.3)
Chloride: 104 mmol/L (ref 98–111)
Creatinine, Ser: 1.46 mg/dL — ABNORMAL HIGH (ref 0.61–1.24)
GFR calc Af Amer: 50 mL/min — ABNORMAL LOW (ref >60)
GFR calc non Af Amer: 43 mL/min — ABNORMAL LOW (ref >60)
Glucose, Bld: 193 mg/dL — ABNORMAL HIGH (ref 70–99)
Potassium: 4 mmol/L (ref 3.5–5.1)
Sodium: 140 mmol/L (ref 135–145)

## 2020-04-22 LAB — PROTIME-INR
INR: 1.9 — ABNORMAL HIGH (ref 0.8–1.2)
Prothrombin Time: 20.8 seconds — ABNORMAL HIGH (ref 11.4–15.2)

## 2020-04-22 LAB — OCCULT BLOOD X 1 CARD TO LAB, STOOL: Fecal Occult Bld: NEGATIVE

## 2020-04-22 MED ORDER — TRAMADOL HCL 50 MG PO TABS: 50.0000 mg | ORAL_TABLET | Freq: Four times a day (QID) | ORAL | 0 refills | Status: DC | PRN

## 2020-04-22 NOTE — Discharge Instructions (Addendum)
Begin taking tramadol as prescribed as needed for pain.  Rest.  Apply heating pad as frequently as possible for the next several days.  Return to the emergency department if you develop worsening pain, dizziness or lightheadedness, or other new and concerning symptoms.

## 2020-04-22 NOTE — ED Triage Notes (Signed)
Ongoing R hip pain for several days. Was seen at UC 2 days ago and dx with a hematoma.

## 2020-04-22 NOTE — ED Notes (Signed)
Pt discharged to home. Discharge instructions have been discussed with patient and/or family members. Pt verbally acknowledges understanding d/c instructions, and endorses comprehension to checkout at registration before leaving.  °

## 2020-04-22 NOTE — ED Notes (Signed)
Pt informed EDP that he had been having dark stools.

## 2020-04-22 NOTE — ED Provider Notes (Signed)
[facility] EMERGENCY DEPARTMENT Provider Note   CSN: [id number] Arrival date & time: 04/22/20  [id number]     History Chief Complaint  Patient presents with  . Hip Pain  . dark stools    [name] is a 87 y.o. male.  Patient is an 86 year old male with past medical history of coronary artery disease status post CABG, atrial fibrillation on Xarelto, hypertension, hyperlipidemia.  He presents today for evaluation of pain in his right hip.  This began 2 days ago.  Patient states he was walking outside at home when this pain began suddenly.  He was seen at urgent care 2 days ago and had laboratory studies obtained.  He was diagnosed with a hematoma related to his Xarelto and was discharged.  He returns today stating that his pain is no better.  He is having discomfort to the right hip, right anterior thigh, and radiates into the right flank.  He denies any numbness or tingling.  He denies any specific injury or trauma.  He does tell me that he had an episode of dark stools but denies any abdominal pain, fevers, dizziness, or lightheadedness.  The history is provided by the patient.  Hip Pain This is a new problem. The current episode started 2 days ago. The problem occurs constantly. The problem has not changed since onset.The symptoms are aggravated by twisting (Walking, movement, and palpation). The symptoms are relieved by rest. He has tried nothing for the symptoms.       Past Medical History:  Diagnosis Date  . Anemia   . Antral gastritis 2013   EGD   . Atrial fibrillation ([place])   . CAD (coronary artery disease)    s/p CABG 1993 (L-LAD, S-RI, S-D1);   echo 1/10: EF 55%;    myoview 12/09: inf MI, no ischemia  . Candida esophagitis ([place]) 2013   EGD   . Cataracts, bilateral   . Family history of malignant neoplasm of gastrointestinal tract   . Folliculitis   . Hiatal hernia   . HTN (hypertension)   . Hyperlipidemia   . Irritable bladder   . Myocardial infarction ([place])    . Obesity   . Osteoarthritis     Patient Active Problem List   Diagnosis Date Noted  . Left hip pain 10/18/2019  . Lumbar spinal stenosis 04/16/2019  . CHF (congestive heart failure) ([place]) 12/17/2016  . Arthritis of left ankle 10/15/2016  . PCP NOTES >>>>> 10/11/2015  . Carpal tunnel syndrome 02/15/2014  . Anemia 08/14/2012  . Annual physical exam 01/08/2012  . Atrial fibrillation ([place]) 03/29/2011  . BRADYCARDIA 06/19/2010  . Venous (peripheral) insufficiency 09/04/2009  . PALPITATIONS 07/07/2009  . OBESITY 07/04/2009  . DJD (degenerative joint disease) 07/04/2009  . SLEEP DISORDER 07/04/2009  . Dyspepsia 02/28/2009  . HEMATOCHEZIA 02/07/2009  . DM II (diabetes mellitus, type II), controlled ([place]) 04/25/2008  . Hyperlipidemia 06/25/2007  . Essential hypertension 06/25/2007  . Coronary atherosclerosis 06/25/2007    Past Surgical History:  Procedure Laterality Date  . CATARACT EXTRACTION Right 07/21/2018  . CHOLECYSTECTOMY     laparoscopic '92  . CORONARY ARTERY BYPASS GRAFT     graft '92: LIMA-LAD, SVG - D2,R1, D1  . ENTEROSCOPY  08/22/2012   Procedure: ENTEROSCOPY;  Surgeon: Juanita Craver, MD;  Location: [facility];  Service: Endoscopy;  Laterality: N/A;  . INTRAOCULAR LENS EXCHANGE Right 07/21/2018  . KNEE SURGERY    . MOLE REMOVAL  2001 and 2008  . PILONIDAL CYST  EXCISION         Family History  Problem Relation Age of Onset  . Coronary artery disease Father        and brother  . Heart attack Father        and brother-fatal  . Stroke Father   . Breast cancer Mother   . Alzheimer's disease Mother   . Colon cancer Maternal Uncle   . Esophageal cancer Neg Hx   . Stomach cancer Neg Hx   . Rectal cancer Neg Hx   . Prostate cancer Neg Hx     Social History   Tobacco Use  . Smoking status: Former Smoker    Years: 16.00    Quit date: 08/14/1964    Years since quitting: 55.7  . Smokeless tobacco: Never Used  . Tobacco comment: quit 1965  Substance Use  Topics  . Alcohol use: Yes    Alcohol/week: 7.0 standard drinks    Types: 7 Shots of liquor per week    Comment: brandy at night  . Drug use: No    Home Medications Prior to Admission medications   Medication Sig Start Date End Date Taking? Authorizing Provider  atorvastatin (LIPITOR) 80 MG tablet Take 1 tablet (80 mg total) by mouth daily. 08/31/19   Lelon Perla, MD  benazepril (LOTENSIN) 10 MG tablet Take 2 tablets (20 mg total) by mouth daily. 02/10/20   Colon Branch, MD  Cholecalciferol (VITAMIN D) 1000 UNITS capsule Take 1,000 Units by mouth daily.      [provider]  Coenzyme Q10 (COQ10 PO) Take 1 capsule by mouth daily.    [provider]  diclofenac sodium (VOLTAREN) 1 % GEL Apply 4 g topically 4 (four) times daily. To affected joint. 09/09/19   Gregor Hams, MD  diphenhydramine-acetaminophen (TYLENOL PM) 25-500 MG TABS tablet Take 2 tablets by mouth at bedtime.    [provider]  furosemide (LASIX) 40 MG tablet Take 1 tablet (40 mg total) by mouth daily. 02/28/20   Colon Branch, MD  Melatonin 5 MG TABS Take 5 mg by mouth at bedtime.    [provider]  metoprolol succinate (TOPROL-XL) 25 MG 24 hr tablet Take 1 tablet (25 mg total) by mouth daily. 07/28/19   Colon Branch, MD  Multiple Vitamin (MULTIVITAMIN WITH MINERALS) TABS tablet Take 1 tablet by mouth daily.    [provider]  pantoprazole (PROTONIX) 40 MG tablet Take 1 tablet (40 mg total) by mouth daily. 06/01/19   Colon Branch, MD  Rivaroxaban (XARELTO) 15 MG TABS tablet Take 1 tablet (15 mg total) by mouth daily with supper. Patient taking differently: Take 20 mg by mouth daily with supper.  07/28/19   Colon Branch, MD  tamsulosin (FLOMAX) 0.4 MG CAPS capsule Take 1 capsule (0.4 mg total) by mouth daily. 11/29/19   Colon Branch, MD  vitamin B-12 (CYANOCOBALAMIN) 1000 MCG tablet Take 1,000 mcg by mouth daily.    [provider]    Allergies    Patient has no known  allergies.  Review of Systems   Review of Systems  All other systems reviewed and are negative.   Physical Exam Updated Vital Signs BP (!) 160/84 (BP Location: Right Arm)   Pulse 84   Temp 98.7 F (37.1 C) (Oral)   Resp 20   Ht 5\' 10"  (1.778 m)   Wt 99.8 kg   SpO2 95%   BMI 31.57 kg/m   Physical  Exam Vitals and nursing note reviewed.  Constitutional:      General: He is not in acute distress.    Appearance: He is well-developed. He is not diaphoretic.  HENT:     Head: Normocephalic and atraumatic.  Cardiovascular:     Rate and Rhythm: Normal rate and regular rhythm.     Heart sounds: No murmur. No friction rub.  Pulmonary:     Effort: Pulmonary effort is normal. No respiratory distress.     Breath sounds: Normal breath sounds. No wheezing or rales.  Abdominal:     General: Bowel sounds are normal. There is no distension.     Palpations: Abdomen is soft.     Tenderness: There is no abdominal tenderness. There is right CVA tenderness.  Musculoskeletal:        General: Normal range of motion.     Cervical back: Normal range of motion and neck supple.     Comments: There is swelling and tenderness to the lateral aspect of the right hip, right anterior thigh.  There is slight ecchymosis noted to the deep tissues.  He has good range of motion.  DP and PT pulses are palpable and motor and sensation is intact to the entire foot.  Skin:    General: Skin is warm and dry.  Neurological:     Mental Status: He is alert and oriented to person, place, and time.     Coordination: Coordination normal.     ED Results / Procedures / Treatments   Labs (all labs ordered are listed, but only abnormal results are displayed) Labs Reviewed  BASIC METABOLIC PANEL  CBC WITH DIFFERENTIAL/PLATELET  PROTIME-INR  OCCULT BLOOD X 1 CARD TO LAB, STOOL    EKG None  Radiology [place] RT LOWER EXTREM LTD SOFT TISSUE NON VASCULAR  Addendum Date: 04/20/2020   ADDENDUM REPORT: 04/20/2020 15:40  ADDENDUM: These results and differential considerations were discussed by telephone on 04/20/2020 at 3:40 pm to provider Theone Murdoch , who verbally acknowledged these results. Electronically Signed   By: Lovena Le M.D.   On: 04/20/2020 15:40   Result Date: 04/20/2020 CLINICAL DATA:  Swelling of the thigh/hip pain EXAM: ULTRASOUND RIGHT LOWER EXTREMITY LIMITED TECHNIQUE: Ultrasound examination of the lower extremity soft tissues was performed in the area of clinical concern. COMPARISON:  None. FINDINGS: Corresponding to the area of concern in the anterolateral right thigh is a large 7.7 x 3.7 x 5.0 cm heterogeneous, predominantly hypoechoic masslike structure suggesting a collection. No internal color Doppler flow is seen. Portions of this collection may demonstrate displacement artifact as well as mixed areas of increased posterior through transmission and others of posterior shadowing. Likely due to the heterogeneity of the internal contents. Surrounding soft tissues are unremarkable IMPRESSION: Heterogeneous, avascular probable collection in the right anterolateral thigh measuring 7.7 x 3.7 x 5.0 cm. Findings may reflect a large hematoma versus heterogeneous seroma. Sterility of this collection is not ascertained on imaging. Recommend correlation with clinical history such as anticoagulant use and laboratory values as well as assessment for recent trauma. Electronically Signed: By: Lovena Le M.D. On: 04/20/2020 15:36    Procedures Procedures (including critical care time)  Medications Ordered in ED Medications - No data to display  ED Course  I have reviewed the triage vital signs and the nursing notes.  Pertinent labs & imaging results that were available during my care of the patient were reviewed by me and considered in my medical decision making (see chart for  details).    MDM Rules/Calculators/A&P  Patient is an 86 year old male presenting with complaints of right hip pain.  He was  seen 2 days ago and diagnosed with a hematoma related to Xarelto therapy.  He returns today complaining of pain not improving.  Today's work-up reveals stable vital signs, hemoglobin of 13.5, and CT scan that shows a hyperdense collection within the deep anterolateral soft tissues of the right hip most compatible with a hematoma.  This will be treated with rest, heating pad, pain medication, and follow-up as needed.  He also complained of an episode of dark stools, the etiology of which I am uncertain.  He does admit to taking Pepto-Bismol recently and I suspect this is the cause.  He is heme-negative today and I do not feel this requires further work-up at this time.  Final Clinical Impression(s) / ED Diagnoses Final diagnoses:  None    Rx / DC Orders ED Discharge Orders    None       Veryl Speak, MD 04/22/20 1201

## 2020-04-22 NOTE — ED Notes (Signed)
ED Provider at bedside. 

## 2020-04-25 ENCOUNTER — Ambulatory Visit: Payer: Medicare Other | Admitting: Sports Medicine

## 2020-04-26 ENCOUNTER — Other Ambulatory Visit: Payer: Self-pay

## 2020-04-26 ENCOUNTER — Encounter: Payer: Self-pay | Admitting: Internal Medicine

## 2020-04-26 ENCOUNTER — Ambulatory Visit (INDEPENDENT_AMBULATORY_CARE_PROVIDER_SITE_OTHER): Payer: Medicare Other | Admitting: Internal Medicine

## 2020-04-26 VITALS — BP 151/80 | HR 50 | Temp 96.8°F | Resp 18 | Ht 70.0 in | Wt 225.4 lb

## 2020-04-26 DIAGNOSIS — E7800 Pure hypercholesterolemia, unspecified: Secondary | ICD-10-CM

## 2020-04-26 DIAGNOSIS — E1159 Type 2 diabetes mellitus with other circulatory complications: Secondary | ICD-10-CM | POA: Diagnosis not present

## 2020-04-26 DIAGNOSIS — M25552 Pain in left hip: Secondary | ICD-10-CM | POA: Diagnosis not present

## 2020-04-26 DIAGNOSIS — I10 Essential (primary) hypertension: Secondary | ICD-10-CM | POA: Diagnosis not present

## 2020-04-26 NOTE — Patient Instructions (Addendum)
Continue checking your blood pressures BP GOAL is between 110/65 and  135/85. If it is consistently higher or lower, let me know  The right dose of Xarelto is 15  mg daily  GO TO THE FRONT DESK, [place] back for a checkup in 3 months

## 2020-04-26 NOTE — Progress Notes (Signed)
Pre visit review using our clinic review tool, if applicable. No additional management support is needed unless otherwise documented below in the visit note. 

## 2020-04-26 NOTE — Progress Notes (Signed)
Subjective:    Patient ID: [name], male    DOB: 1931/07/22, 86 y.o.   MRN: [id number]  DOS:  04/26/2020 Type of visit - description: Routine visit No major concerns Since the last time he was here, he went to the ER twice, notes reviewed. We review his ambulatory BPs    Review of Systems Denies chest pain no difficulty breathing. Lower extremity edema at baseline, mild.  Past Medical History:  Diagnosis Date  . Anemia   . Antral gastritis 2013   EGD   . Atrial fibrillation ([place])   . CAD (coronary artery disease)    s/p CABG 1993 (L-LAD, S-RI, S-D1);   echo 1/10: EF 55%;    myoview 12/09: inf MI, no ischemia  . Candida esophagitis ([place]) 2013   EGD   . Cataracts, bilateral   . Family history of malignant neoplasm of gastrointestinal tract   . Folliculitis   . Hiatal hernia   . HTN (hypertension)   . Hyperlipidemia   . Irritable bladder   . Myocardial infarction ([place])   . Obesity   . Osteoarthritis     Past Surgical History:  Procedure Laterality Date  . CATARACT EXTRACTION Right 07/21/2018  . CHOLECYSTECTOMY     laparoscopic '92  . CORONARY ARTERY BYPASS GRAFT     graft '92: LIMA-LAD, SVG - D2,R1, D1  . ENTEROSCOPY  08/22/2012   Procedure: ENTEROSCOPY;  Surgeon: Juanita Craver, MD;  Location: [facility];  Service: Endoscopy;  Laterality: N/A;  . INTRAOCULAR LENS EXCHANGE Right 07/21/2018  . KNEE SURGERY    . MOLE REMOVAL  2001 and 2008  . PILONIDAL CYST EXCISION      Allergies as of 04/26/2020   No Known Allergies     Medication List       Accurate as of Apr 26, 2020 11:59 PM. If you have any questions, ask your nurse or doctor.        atorvastatin 80 MG tablet Commonly known as: LIPITOR Take 1 tablet (80 mg total) by mouth daily.   benazepril 10 MG tablet Commonly known as: LOTENSIN Take 2 tablets (20 mg total) by mouth daily.   COQ10 PO Take 1 capsule by mouth daily.   diclofenac sodium 1 % Gel Commonly known as: VOLTAREN Apply 4 g  topically 4 (four) times daily. To affected joint.   diphenhydramine-acetaminophen 25-500 MG Tabs tablet Commonly known as: TYLENOL PM Take 2 tablets by mouth at bedtime.   furosemide 40 MG tablet Commonly known as: LASIX Take 1 tablet (40 mg total) by mouth daily.   melatonin 5 MG Tabs Take 5 mg by mouth at bedtime.   metoprolol succinate 25 MG 24 hr tablet Commonly known as: TOPROL-XL Take 1 tablet (25 mg total) by mouth daily.   multivitamin with minerals Tabs tablet Take 1 tablet by mouth daily.   pantoprazole 40 MG tablet Commonly known as: PROTONIX Take 1 tablet (40 mg total) by mouth daily.   Rivaroxaban 15 MG Tabs tablet Commonly known as: XARELTO Take 1 tablet (15 mg total) by mouth daily with supper. What changed: how much to take   tamsulosin 0.4 MG Caps capsule Commonly known as: FLOMAX Take 1 capsule (0.4 mg total) by mouth daily.   traMADol 50 MG tablet Commonly known as: ULTRAM Take 1 tablet (50 mg total) by mouth every 6 (six) hours as needed.   vitamin B-12 1000 MCG tablet Commonly known as: CYANOCOBALAMIN Take 1,000 mcg by mouth daily.  Vitamin D 1000 units capsule Take 1,000 Units by mouth daily.          Objective:   Physical Exam BP (!) 151/80 (BP Location: Left Arm, Patient Position: Sitting, Cuff Size: Normal)   Pulse (!) 50   Temp (!) 96.8 F (36 C) (Temporal)   Resp 18   Ht 5\' 10"  (1.778 m)   Wt 225 lb 6 oz (102.2 kg)   SpO2 97%   BMI 32.34 kg/m  General:   Well developed, NAD, BMI noted. HEENT:  Normocephalic . Face symmetric, atraumatic Lungs:  Creased breath sounds Normal respiratory effort, no intercostal retractions, no accessory muscle use. Heart: bradycardic Lower extremities: trace to +/+++  pretibial edema bilaterally  Skin: Not pale. Not jaundice Neurologic:  alert & oriented X3.  Speech normal, gait appropriate for age and unassisted Psych--  Cognition and judgment appear intact.  Cooperative with normal  attention span and concentration.  Behavior appropriate. No anxious or depressed appearing.      Assessment      Assessment  DM --diet control, no neuropathy HTN Hyperlipidemia CRI:  CV: --CAD, CABG 1993, Myoview 2009 no ischemia. --Atrial fibrillation -- rate control, xarelto -- chronic combined systolic/diastolic congestive heart failure Venous insufficiency, mild LE edema L>>R  LUTS:  DRE-PSA wnl 07-2016 MSK: DJD, spinal stenosis, R wrist Fx in the 80s GI: --Recurrent melena: Work-up 2013 Dr. Sharlett Iles: Colonoscopy, EGD, capsule endoscopy.  Felt to be due to NSAIDs --Candida esophagitis, antral gastritis --->  2013 per EGD --HH Sees derm x 2/year  PLAN: DM: Last A1c satisfactory.  No change, diet controlled. HTN: BP today slightly elevated at home in the last 2 days: 120/56, 132/78.  Continue Lotensin, Lasix, metoprolol, last BMP okay. CRI: Last creatinine satisfactory, stable. Hip pain: Evaluated at the ER twice, last time was 04/22/2020 BMP was a stable, hemoglobin stable, CT abdomen, pelvis and hip: Strain with intramuscular hemorrhage at the R tensor fascia muscle.  No other acute abnormalities.  Overall symptoms improved, less pain, less swelling. Atrial fibrillation: The patient is unsure of Xarelto dose, the right dose is 15 mg daily per chart review. RTC 3 months   This visit occurred during the SARS-CoV-2 public health emergency.  Safety protocols were in place, including screening questions prior to the visit, additional usage of staff PPE, and extensive cleaning of exam room while observing appropriate contact time as indicated for disinfecting solutions.

## 2020-04-27 NOTE — Assessment & Plan Note (Signed)
DM: Last A1c satisfactory.  No change, diet controlled. HTN: BP today slightly elevated at home in the last 2 days: 120/56, 132/78.  Continue Lotensin, Lasix, metoprolol, last BMP okay. CRI: Last creatinine satisfactory, stable. Hip pain: Evaluated at the ER twice, last time was 04/22/2020 BMP was a stable, hemoglobin stable, CT abdomen, pelvis and hip: Strain with intramuscular hemorrhage at the R tensor fascia muscle.  No other acute abnormalities.  Overall symptoms improved, less pain, less swelling. Atrial fibrillation: The patient is unsure of Xarelto dose, the right dose is 15 mg daily per chart review. RTC 3 months

## 2020-05-15 ENCOUNTER — Telehealth: Payer: Self-pay | Admitting: Internal Medicine

## 2020-05-15 MED ORDER — BENAZEPRIL HCL 10 MG PO TABS: 20.0000 mg | ORAL_TABLET | Freq: Every day | ORAL | 1 refills | Status: DC

## 2020-05-15 NOTE — Telephone Encounter (Signed)
Rx sent 

## 2020-05-15 NOTE — Telephone Encounter (Signed)
Caller: [place] Call back phone number: [phone]  Patient would like a refill on Benazepril 20 MG instead of 10 mg   Medication: benazepril (LOTENSIN)      Has the patient contacted their pharmacy?  (If no, request that the patient contact the pharmacy for the refill.) (If yes, when and what did the pharmacy advise?)     Preferred Pharmacy (with phone number or street name): [id number] - [place], [address]  [place], [place] [address]  Phone:  [phone] Fax:  [phone]      Agent: Please be advised that RX refills may take up to 3 business days. We ask that you follow-up with your pharmacy.

## 2020-05-16 ENCOUNTER — Telehealth: Payer: Self-pay | Admitting: Internal Medicine

## 2020-05-16 NOTE — Progress Notes (Signed)
  Chronic Care Management   Note  05/16/2020 Name: [name] MRN: [id number] DOB: 23-Aug-1931  [name] is a 86 y.o. year old male who is a primary care patient of Colon Branch, MD. I reached out to Florence Canner by phone today in response to a referral sent by [name]'s PCP, Colon Branch, MD.   [name] was given information about Chronic Care Management services today including:  1. CCM service includes personalized support from designated clinical staff supervised by his physician, including individualized plan of care and coordination with other care providers 2. 24/7 contact phone numbers for assistance for urgent and routine care needs. 3. Service will only be billed when office clinical staff spend 20 minutes or more in a month to coordinate care. 4. Only one practitioner may furnish and bill the service in a calendar month. 5. The patient may stop CCM services at any time (effective at the end of the month) by phone call to the office staff.   Patient agreed to services and verbal consent obtained.   This note is not being shared with the patient for the following reason: To respect privacy (The patient or proxy has requested that the information not be shared). Follow up plan:   Earney [place] [facility] Scheduler

## 2020-05-22 DIAGNOSIS — H353131 Nonexudative age-related macular degeneration, bilateral, early dry stage: Secondary | ICD-10-CM | POA: Diagnosis not present

## 2020-05-22 DIAGNOSIS — D3132 Benign neoplasm of left choroid: Secondary | ICD-10-CM | POA: Diagnosis not present

## 2020-05-22 DIAGNOSIS — H0100A Unspecified blepharitis right eye, upper and lower eyelids: Secondary | ICD-10-CM | POA: Diagnosis not present

## 2020-05-22 DIAGNOSIS — H029 Unspecified disorder of eyelid: Secondary | ICD-10-CM | POA: Diagnosis not present

## 2020-05-22 DIAGNOSIS — H43813 Vitreous degeneration, bilateral: Secondary | ICD-10-CM | POA: Diagnosis not present

## 2020-05-22 LAB — HM DIABETES EYE EXAM

## 2020-05-26 ENCOUNTER — Other Ambulatory Visit: Payer: Self-pay

## 2020-05-26 ENCOUNTER — Other Ambulatory Visit: Payer: Self-pay | Admitting: Internal Medicine

## 2020-05-26 MED ORDER — ATORVASTATIN CALCIUM 80 MG PO TABS: 80.0000 mg | ORAL_TABLET | Freq: Every day | ORAL | 2 refills | Status: DC

## 2020-06-01 ENCOUNTER — Encounter: Payer: Self-pay | Admitting: Internal Medicine

## 2020-06-07 ENCOUNTER — Ambulatory Visit (INDEPENDENT_AMBULATORY_CARE_PROVIDER_SITE_OTHER): Payer: Medicare Other | Admitting: Sports Medicine

## 2020-06-07 ENCOUNTER — Other Ambulatory Visit: Payer: Self-pay

## 2020-06-07 DIAGNOSIS — M47816 Spondylosis without myelopathy or radiculopathy, lumbar region: Secondary | ICD-10-CM

## 2020-06-07 MED ORDER — PREDNISONE 50 MG PO TABS: ORAL_TABLET | ORAL | 0 refills | Status: DC

## 2020-06-07 NOTE — Progress Notes (Signed)
    Procedures performed today:    None.  Independent interpretation of notes and tests performed by another provider:   I personally reviewed a CT of the abdomen and pelvis from earlier this year, he does have severe and widespread facet arthritis.  Brief History, Exam, Impression, and Recommendations:    Lumbar facet joint syndrome [name] is having a recurrence of back pain, right-sided, worse with standing. He did have a CT scan of the abdomen and pelvis, dominant finding was widespread facet arthritis in his spine. We are going to start conservatively again, prednisone, home rehab exercises, return to see me in a month, will do right-sided multilevel facet injections if no better.    ___________________________________________ Gwen Her. Dianah Field, M.D., ABFM., CAQSM. Primary Care and [place] [employer] of [employer] [facility] [employer]

## 2020-06-07 NOTE — Assessment & Plan Note (Signed)
[name] is having a recurrence of back pain, right-sided, worse with standing. He did have a CT scan of the abdomen and pelvis, dominant finding was widespread facet arthritis in his spine. We are going to start conservatively again, prednisone, home rehab exercises, return to see me in a month, will do right-sided multilevel facet injections if no better.

## 2020-06-09 ENCOUNTER — Telehealth: Payer: Self-pay | Admitting: Cardiology

## 2020-06-09 NOTE — Telephone Encounter (Signed)
   Pt c/o medication issue:  1. Name of Medication:   predniSONE (DELTASONE) 50 MG tablet    2. How are you currently taking this medication (dosage and times per day)?   3. Are you having a reaction (difficulty breathing--STAT)?   4. What is your medication issue? Shirlee Limerick called, she said this is a new prescription pt has and just starting taking it, she said pt's bp is elevating and would like to know if this is safe for the pt to take

## 2020-06-09 NOTE — Telephone Encounter (Signed)
Returned the call to the patient's friend and the patient. The patient is currently on Prednisone for his back. He has two more days left. Blood pressure last night was 160/84 and 155/79 this morning. He is asymptomatic.  They have been advised to keep track of the blood pressure even after he is done on Sunday and to call back if it is still elevated or he becomes symptomatic. They have been made aware of the on call provdier if needed.

## 2020-06-19 ENCOUNTER — Other Ambulatory Visit: Payer: Self-pay

## 2020-06-19 MED ORDER — PANTOPRAZOLE SODIUM 40 MG PO TBEC: 40.0000 mg | DELAYED_RELEASE_TABLET | Freq: Every day | ORAL | 3 refills | Status: DC

## 2020-06-28 ENCOUNTER — Ambulatory Visit: Payer: Medicare Other | Admitting: Orthopaedic Surgery

## 2020-06-28 ENCOUNTER — Encounter: Payer: Self-pay | Admitting: Orthopaedic Surgery

## 2020-06-28 VITALS — BP 137/75 | HR 61 | Ht 71.0 in | Wt 221.0 lb

## 2020-06-28 DIAGNOSIS — M47816 Spondylosis without myelopathy or radiculopathy, lumbar region: Secondary | ICD-10-CM | POA: Diagnosis not present

## 2020-06-28 NOTE — Progress Notes (Signed)
Office Visit Note   Patient: [name]           Date of Birth: 1931/07/27           MRN: [id number] Visit Date: 06/28/2020              Requested by: Colon Branch, MD [address] [place],  [place] [id number] PCP: Colon Branch, MD   Assessment & Plan: Visit Diagnoses:  1. Lumbar facet joint syndrome     Plan: Patient has some lumbar facet arthropathy intermittent back pain mid upper lumbar sometimes right sometimes left.  I discussed with him that if he is able to play golf 3 times a week I do not think he needs any injections in his back and certainly does not need any surgery.  If his symptoms increase he can return.  No additional imaging studies are recommended at this time.  Follow-Up Instructions: Return if symptoms worsen or fail to improve.   Orders:  No orders of the defined types were placed in this encounter.  No orders of the defined types were placed in this encounter.     Procedures: No procedures performed   Clinical Data: No additional findings.   Subjective: Chief Complaint  Patient presents with  . Lower Back - Pain    HPI 86 year old male new patient visit for low back pain symptoms.  He plays golf 3 times a week states he has had to miss 1day golf within the last week due to back pain.  He was placed on prednisone by Dr.Thekkekandam at [facility] on 06/07/2020 which gave him some improvement.  He has had back pain symptoms sometimes on one side than on the other usually upper lumbar regions.  He has had some back pain off and on but still has been able to play golf 3 times a week.  Sometimes the pain is worse when he standing but is long as he keeps moving he can be on his feet for prolonged period of time.  No associated bowel or bladder symptoms.  CT scan abdomen and pelvis without contrast done 04/22/2020 showed intramuscular hemorrhage right tensor fascia lata muscle otherwise no acute changes.  He did  have some mild proximal enlargement at age 70 and also had some facet arthropathy but lumbar spine imaging did not show any areas of severe stenosis.  Review of Systems Patient had problems with the sleep disorder atrial fibs hyperlipidemia type 2 diabetes.  Negative for neurogenic claudication symptoms positive for back pain history of heart failure not currently symptomatic.  Objective: Vital Signs: BP 137/75   Pulse 61   Ht 5\' 11"  (1.803 m)   Wt 221 lb (100.2 kg)   BMI 30.82 kg/m   Physical Exam Constitutional:      Appearance: He is well-developed.  HENT:     Head: Normocephalic and atraumatic.  Eyes:     Pupils: Pupils are equal, round, and reactive to light.  Neck:     Thyroid: No thyromegaly.     Trachea: No tracheal deviation.  Cardiovascular:     Rate and Rhythm: Normal rate.  Pulmonary:     Effort: Pulmonary effort is normal.     Breath sounds: No wheezing.  Abdominal:     General: Bowel sounds are normal.     Palpations: Abdomen is soft.  Skin:    General: Skin is warm and dry.  Capillary Refill: Capillary refill takes less than 2 seconds.  Neurological:     Mental Status: He is alert and oriented to person, place, and time.  Psychiatric:        Behavior: Behavior normal.        Thought Content: Thought content normal.        Judgment: Judgment normal.     Ortho Exam patient has intact reflexes he is ambulatory.  He has some mild edema right and left pulses are palpable.  No trochanteric bursa tenderness good quad strength anterior tib EHL is intact.  Specialty Comments:  No specialty comments available.  Imaging: No results found.   PMFS History: Patient Active Problem List   Diagnosis Date Noted  . Left hip pain 10/18/2019  . Lumbar facet joint syndrome 04/16/2019  . CHF (congestive heart failure) ([place]) 12/17/2016  . Arthritis of left ankle 10/15/2016  . PCP NOTES >>>>> 10/11/2015  . Carpal tunnel syndrome 02/15/2014  . Anemia 08/14/2012    . Annual physical exam 01/08/2012  . Atrial fibrillation ([place]) 03/29/2011  . BRADYCARDIA 06/19/2010  . Venous (peripheral) insufficiency 09/04/2009  . PALPITATIONS 07/07/2009  . OBESITY 07/04/2009  . DJD (degenerative joint disease) 07/04/2009  . SLEEP DISORDER 07/04/2009  . Dyspepsia 02/28/2009  . HEMATOCHEZIA 02/07/2009  . DM II (diabetes mellitus, type II), controlled ([place]) 04/25/2008  . Hyperlipidemia 06/25/2007  . Essential hypertension 06/25/2007  . Coronary atherosclerosis 06/25/2007   Past Medical History:  Diagnosis Date  . Anemia   . Antral gastritis 2013   EGD   . Atrial fibrillation ([place])   . CAD (coronary artery disease)    s/p CABG 1993 (L-LAD, S-RI, S-D1);   echo 1/10: EF 55%;    myoview 12/09: inf MI, no ischemia  . Candida esophagitis ([place]) 2013   EGD   . Cataracts, bilateral   . Family history of malignant neoplasm of gastrointestinal tract   . Folliculitis   . Hiatal hernia   . HTN (hypertension)   . Hyperlipidemia   . Irritable bladder   . Myocardial infarction ([place])   . Obesity   . Osteoarthritis     Family History  Problem Relation Age of Onset  . Coronary artery disease Father        and brother  . Heart attack Father        and brother-fatal  . Stroke Father   . Breast cancer Mother   . Alzheimer's disease Mother   . Colon cancer Maternal Uncle   . Esophageal cancer Neg Hx   . Stomach cancer Neg Hx   . Rectal cancer Neg Hx   . Prostate cancer Neg Hx     Past Surgical History:  Procedure Laterality Date  . CATARACT EXTRACTION Right 07/21/2018  . CHOLECYSTECTOMY     laparoscopic '92  . CORONARY ARTERY BYPASS GRAFT     graft '92: LIMA-LAD, SVG - D2,R1, D1  . ENTEROSCOPY  08/22/2012   Procedure: ENTEROSCOPY;  Surgeon: Juanita Craver, MD;  Location: [facility];  Service: Endoscopy;  Laterality: N/A;  . INTRAOCULAR LENS EXCHANGE Right 07/21/2018  . KNEE SURGERY    . MOLE REMOVAL  2001 and 2008  . PILONIDAL CYST EXCISION     Social  History   Occupational History  . Occupation: Retired    Fish farm manager: RETIRED  Tobacco Use  . Smoking status: Former Smoker    Years: 16.00    Quit date: 08/14/1964    Years since quitting: 55.9  . Smokeless  tobacco: Never Used  . Tobacco comment: quit 1965  Vaping Use  . Vaping Use: Never used  Substance and Sexual Activity  . Alcohol use: Yes    Alcohol/week: 7.0 standard drinks    Types: 7 Shots of liquor per week    Comment: brandy at night  . Drug use: No  . Sexual activity: Not on file

## 2020-07-05 ENCOUNTER — Other Ambulatory Visit: Payer: Self-pay

## 2020-07-05 ENCOUNTER — Ambulatory Visit (INDEPENDENT_AMBULATORY_CARE_PROVIDER_SITE_OTHER): Payer: Medicare Other | Admitting: Sports Medicine

## 2020-07-05 DIAGNOSIS — M47816 Spondylosis without myelopathy or radiculopathy, lumbar region: Secondary | ICD-10-CM | POA: Diagnosis not present

## 2020-07-05 MED ORDER — TRAMADOL HCL 50 MG PO TABS: 50.0000 mg | ORAL_TABLET | Freq: Three times a day (TID) | ORAL | 0 refills | Status: DC | PRN

## 2020-07-05 NOTE — Progress Notes (Addendum)
    Procedures performed today:    None.  Independent interpretation of notes and tests performed by another provider:   [name]'s MRI was personally reviewed, he has severe spinal stenosis at the L4-L5 level with severe multilevel facet arthritis  Brief History, Exam, Impression, and Recommendations:    Lumbar facet joint syndrome [name] returns, he has continued back pain, now bilateral, worse with standing. A CT scan of his abdomen and pelvis in May showed some widespread facet arthritis, it was difficult to fully appreciate any areas of spinal stenosis. He has not responded to prednisone, home exercises, he did see an outside orthopedist without much improvement or change in the current plan. At this point we are going to proceed with an MRI for interventional planning, likely multilevel facet joint injections. I am also going to add some tramadol for pain relief when he plays golf.  [name]'s MRI was personally reviewed, he has severe spinal stenosis at the L4-L5 level with severe multilevel facet arthritis, we will start with a bilateral L4-L5 transforaminal epidural, if this does not work we will try multilevel facet joint injections. Return to see me 4 weeks after his epidural.    ___________________________________________ [name]. [name], M.D., ABFM., CAQSM. Primary Care and [place] [employer] of [employer] [place] [employer] [facility] [employer]

## 2020-07-05 NOTE — Assessment & Plan Note (Addendum)
[name] returns, he has continued back pain, now bilateral, worse with standing. A CT scan of his abdomen and pelvis in May showed some widespread facet arthritis, it was difficult to fully appreciate any areas of spinal stenosis. He has not responded to prednisone, home exercises, he did see an outside orthopedist without much improvement or change in the current plan. At this point we are going to proceed with an MRI for interventional planning, likely multilevel facet joint injections. I am also going to add some tramadol for pain relief when he plays golf.  [name]'s MRI was personally reviewed, he has severe spinal stenosis at the L4-L5 level with severe multilevel facet arthritis, we will start with a bilateral L4-L5 transforaminal epidural, if this does not work we will try multilevel facet joint injections. Return to see me 4 weeks after his epidural.

## 2020-07-10 ENCOUNTER — Ambulatory Visit (INDEPENDENT_AMBULATORY_CARE_PROVIDER_SITE_OTHER): Payer: Medicare Other

## 2020-07-10 ENCOUNTER — Other Ambulatory Visit: Payer: Self-pay

## 2020-07-10 DIAGNOSIS — M545 Low back pain: Secondary | ICD-10-CM | POA: Diagnosis not present

## 2020-07-10 DIAGNOSIS — M47816 Spondylosis without myelopathy or radiculopathy, lumbar region: Secondary | ICD-10-CM

## 2020-07-12 NOTE — Addendum Note (Signed)
Addended by: Silverio Decamp on: 07/12/2020 09:21 AM   Modules accepted: Orders

## 2020-07-24 ENCOUNTER — Other Ambulatory Visit: Payer: Self-pay

## 2020-07-24 ENCOUNTER — Ambulatory Visit: Payer: Medicare Other | Admitting: Pharmacist

## 2020-07-24 DIAGNOSIS — I10 Essential (primary) hypertension: Secondary | ICD-10-CM

## 2020-07-24 DIAGNOSIS — E7800 Pure hypercholesterolemia, unspecified: Secondary | ICD-10-CM

## 2020-07-24 DIAGNOSIS — E1159 Type 2 diabetes mellitus with other circulatory complications: Secondary | ICD-10-CM

## 2020-07-24 DIAGNOSIS — M47816 Spondylosis without myelopathy or radiculopathy, lumbar region: Secondary | ICD-10-CM

## 2020-07-24 NOTE — Chronic Care Management (AMB) (Signed)
Chronic Care Management Pharmacy  Name: [name]  MRN: [id number] DOB: Dec 30, 1930  Chief Complaint/ HPI  [name],  86 Clayton.o. , male presents for their Initial CCM visit with the clinical pharmacist via telephone due to COVID-19 Pandemic.  PCP : Clayton Branch, MD  Their chronic conditions include: Hypertension, Hyperlipidemia/Coronary Atherosclerosis, Diabetes, Afib, Heart Failure, GERD, BPH, Pain  Office Visits: 04/26/20: Visit w/ Dr. Larose [name] - No med changes noted.   04/10/20: Medicare Annual Wellness Exam w/ Clayton Plummer, RN -  Goal updated to maintain healthy active lifestyle  01/28/20: Visit w/ Dr. Larose [name] - Benazepril increased to 79m daily  Consult Visit: 07/05/20: Sports Med Visit w/ Dr. TDianah [name]- May 2021 MRI showed widespread facet arthritis. Pt not responded to prednisone and home exercised. MRI for interventional planning, likely multilevel facet joint injections. Add tramadol for pain relief. MRI = severe spinal stenosis at L4-L5. Will start transforaminal epidural, if this doesn'Clayton work will try multilevel facet injections. RTC 4 weeks after epidural. Referral to pain management.   06/28/20: Ortho visit w/ Dr. YLorin [name]- No med changes noted.  06/07/20: Sports Med Visit w/ Dr. TDianah [name]- Lumbar facet joint syndrome. Prescribed prednisone 538mfor 5 days, home rehab exercises.  05/22/20: Ophthalmology visit w/ Dr. HeJerilee [name] Blepharitis with dry eye symptoms - hot compress, artificial tears, mild soap such as ocusoft or baby soap, consider 0.02% hypochlorous acid 1-2/[name]. May try erythromycin ointment or other abx HS if not improved.   03/23/20: Derm Visit w/ Dr. 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 CrStanford [name] No med changes noted.  ED Visit:  04/22/20: [facility] - Hematoma of R hip - Tramadol 509m6H PRN.  04/20/20: [facility] - Hematoma of R thigh - Wear ace wrap, tylenol PRN, ice pack 3-4 x/[name] for 2 days.     Medications: Outpatient Encounter Medications as of 07/24/2020  Medication Sig Note  . atorvastatin (LIPITOR) 80 MG tablet Take 1 tablet (80 mg total) by mouth daily. 07/24/2020: Has only been taking 1/2 tab (66m64maily  . benazepril (LOTENSIN) 10 MG tablet Take 2 tablets (20 mg total) by mouth daily. 07/24/2020: Would like to get 20mg49m at next refill  . Coenzyme Q10 (COQ10) 100 MG CAPS Take 1 capsule by mouth daily.    . diphenhydramine-acetaminophen (TYLENOL PM) 25-500 MG TABS tablet Take 2 tablets by mouth at bedtime.   . furosemide (LASIX) 40 MG tablet Take 1 tablet (40 mg total) by mouth daily.   . Melatonin 10 MG TABS Take 10 mg by mouth at bedtime.    . metoprolol succinate (TOPROL-XL) 25 MG 24 hr tablet Take 1 tablet (25 mg total) by mouth daily.   . Multiple Vitamin (MULTIVITAMIN WITH MINERALS) TABS tablet Take 1 tablet by mouth daily.   . pantoprazole (PROTONIX) 40 MG tablet Take 1 tablet (40 mg total) by mouth daily.   . Rivaroxaban (XARELTO) 15 MG TABS tablet Take 1 tablet (15 mg total) by mouth daily with supper.   . tamsulosin (FLOMAX) 0.4 MG CAPS capsule Take 1 capsule (0.4 mg total) by mouth daily.   . traMADol (ULTRAM) 50 MG tablet Take 1 tablet (50 mg total) by mouth 3 (three) times daily as needed. 07/24/2020: Uses as needed mornings that Clayton plays golf  . Cholecalciferol (VITAMIN D) 1000 UNITS capsule Take 1,000 Units by mouth daily.   (Patient not taking: Reported on  07/24/2020)   . diclofenac sodium (VOLTAREN) 1 % GEL Apply 4 g topically 4 (four) times daily. To affected joint. (Patient not taking: Reported on 07/24/2020)   . vitamin B-12 (CYANOCOBALAMIN) 1000 MCG tablet Take 1,000 mcg by mouth daily. (Patient not taking: Reported on 07/24/2020)    No facility-administered encounter medications on file as of 07/24/2020.   SDOH Screenings   Alcohol Screen:   . Last Alcohol Screening Score (AUDIT):   Depression (PHQ2-9): Low Risk   . PHQ-2 Score: 0  Financial Resource  Strain: Low Risk   . Difficulty of Paying Living Expenses: Not hard at all  Food Insecurity: No Food Insecurity  . Worried About Charity fundraiser in the Last Year: Never true  . Ran Out of Food in the Last Year: Never true  Housing: Low Risk   . Last Housing Risk Score: 0  Physical Activity:   . Days of Exercise per Week:   . Minutes of Exercise per Session:   Social Connections:   . Frequency of Communication with Friends and Family:   . Frequency of Social Gatherings with Friends and Family:   . Attends Religious Services:   . Active Member of Clubs or Organizations:   . Attends Archivist Meetings:   Marland Kitchen Marital Status:   Stress:   . Feeling of Stress :   Tobacco Use: Medium Risk  . Smoking Tobacco Use: Former Smoker  . Smokeless Tobacco Use: Never Used  Transportation Needs: No Transportation Needs  . Lack of Transportation (Medical): No  . Lack of Transportation (Non-Medical): No     Current Diagnosis/Assessment:  Goals Addressed            This Visit's Progress   . Chronic Care Management Pharmacy Care Plan       CARE PLAN ENTRY (see longitudinal plan of care for additional care plan information)  Current Barriers:  . Chronic Disease Management support, education, and care coordination needs related to Hypertension, Hyperlipidemia/Coronary Atherosclerosis, Diabetes, Afib, Heart Failure, GERD, BPH, Pain   Hypertension BP Readings from Last 3 Encounters:  06/28/20 137/75  04/26/20 (!) 151/80  04/22/20 (!) 156/111   . Pharmacist Clinical Goal(s): o Over the next 90 days, patient will work with PharmD and providers to achieve BP goal <140/90 . Current regimen:  . Benazepril 43m #2 tabs daily AM . Metoprolol succinate 271mdaily AM . Interventions: o Requested patient to check BP 2-3 times per week and record . Patient self care activities - Over the next 90 days, patient will: o Check BP 2-3 times per week, document, and provide at future  appointments o Ensure daily salt intake < 2300 mg/[name]  Hyperlipidemia Lab Results  Component Value Date/Time   LDLCALC 57 02/24/2019 10:29 AM   LDLDIRECT 46.0 02/07/2020 08:15 AM   . Pharmacist Clinical Goal(s): o Over the next 90 days, patient will work with PharmD and providers to maintain LDL goal < 70  . Current regimen:  . Atorvastatin 8033m/2 tab (56m59maily  . Fish Oil 1000mg91mly . Coenzyme Q10 100mg 85my . Interventions: o Requested that patient inform Dr. CrenshStanford Breedhe is only taking 1/2 tab of atorvastatin to help align fill dates appropriately when prescribing . Patient self care activities - Over the next 90 days, patient will: o Get atorvastatin script updated to reflect how Clayton takes it  o Maintain cholesterol medication regimen.   Diabetes Lab Results  Component Value Date/Time   HGBA1C 6.8 (H)  02/07/2020 08:15 AM   HGBA1C 6.9 (H) 07/28/2019 09:32 AM   . Pharmacist Clinical Goal(s): o Over the next 90 days, patient will work with PharmD and providers to maintain A1c goal <7% . Current regimen:  o Diet and exercise management   . Interventions: o Discussed diet and exercise . Patient self care activities - Over the next 90 days, patient will: o Maintain a1c <7%  Pain . Pharmacist Clinical Goal(s) o Over the next 90 days, patient will work with PharmD and providers to reduce symptoms of pain . Current regimen:  . Tramadol 26m three times daily as needed . Tylenol PM 25-5063m#2 HS . Interventions: o Coordinated appt with Dr. O'Isabelle Courseffice for 08/21/2020 . Patient self care activities - Over the next 90 days, patient will: o Keep appt with Dr. O'Francesco RunnerMedication management . Pharmacist Clinical Goal(s): o Over the next 90 days, patient will work with PharmD and providers to achieve optimal medication adherence . Current pharmacy: [facility] . Interventions o Comprehensive medication review performed. o Continue current medication management  strategy . Patient self care activities - Over the next 90 days, patient will: o Focus on medication adherence by filling and taking medications appropriately  o Take medications as prescribed o Report any questions or concerns to PharmD and/or provider(s)  Initial goal documentation      Social Hx:  [place]   Hypertension   BP goal is:  <140/90  Office blood pressures are  BP Readings from Last 3 Encounters:  06/28/20 137/75  04/26/20 (!) 151/80  04/22/20 (!) 156/111   CMP Latest Ref Rng & Units 04/22/2020 02/07/2020 07/28/2019  Glucose 70 - 99 mg/dL 193(H) 153(H) 151(H)  BUN 8 - 23 mg/dL 22 16 24(H)  Creatinine 0.61 - 1.24 mg/dL 1.46(H) 1.62(H) 1.62(H)  Sodium 135 - 145 mmol/L 140 142 139  Potassium 3.5 - 5.1 mmol/L 4.0 4.1 4.4  Chloride 98 - 111 mmol/L 104 105 103  CO2 22 - 32 mmol/L 24 28 27   Calcium 8.9 - 10.3 mg/dL 9.2 9.2 9.5  Total Protein 6.0 - 8.3 g/dL - 6.8 -  Total Bilirubin 0.2 - 1.2 mg/dL - 0.7 -  Alkaline Phos 39 - 117 U/L - 54 -  AST 0 - 37 U/L - 19 -  ALT 0 - 53 U/L - 18 -   Kidney Function Lab Results  Component Value Date/Time   CREATININE 1.46 (H) 04/22/2020 10:22 AM   CREATININE 1.62 (H) 02/07/2020 08:15 AM   CREATININE 1.74 (H) 09/16/2018 02:59 PM   CREATININE 1.49 (H) 01/30/2017 10:07 AM   GFR 40.58 (L) 02/07/2020 08:15 AM   GFRNONAA 43 (L) 04/22/2020 10:22 AM   GFRNONAA 52 (L) 10/06/2013 05:03 PM   GFRAA 50 (L) 04/22/2020 10:22 AM   GFRAA 60 10/06/2013 05:03 PM   K 4.0 04/22/2020 10:22 AM   K 4.1 02/07/2020 08:15 AM   Patient checks BP at home infrequently Patient home BP readings are ranging: Pt reports AM BP 150-160s before meds and 130-140s after meds.  Patient has failed these meds in the past: amlodipine (low bp) Patient is currently uncontrolled on the following medications:  . Benazepril 1090m2 tabs daily AM . Metoprolol succinate 52m88mily AM  Clayton would like to switch to 20mg31m of benazepril, but still has  plenty of 10mg 53m. Denies chest pain, headache, dizziness Clayton states Clayton can tell when his BP runs high. Clayton gets a slight headache.  We discussed BP goal, BP technique  Plan -Check BP 2-3 times per week and record -Continue current medications     Hyperlipidemia/Coronary Atherosclerosis   LDL goal <70  Lipid Panel     Component Value Date/Time   CHOL 107 02/07/2020 0815   TRIG 253.0 (H) 02/07/2020 0815   HDL 25.50 (L) 02/07/2020 0815   LDLCALC 57 02/24/2019 1029   LDLDIRECT 46.0 02/07/2020 0815    Hepatic Function Latest Ref Rng & Units 02/07/2020 02/24/2019 05/01/2018  Total Protein 6.0 - 8.3 g/dL 6.8 - -  Albumin 3.5 - 5.2 g/dL 4.1 - -  AST 0 - 37 U/L 19 23 23   ALT 0 - 53 U/L 18 19 -  Alk Phosphatase 39 - 117 U/L 54 - -  Total Bilirubin 0.2 - 1.2 mg/dL 0.7 - -  Bilirubin, Direct 0.0 - 0.3 mg/dL - - -     The ASCVD Risk score (Cardwell., et al., 2013) failed to calculate for the following reasons:   The 2013 ASCVD risk score is only valid for ages 51 to 106   Patient has failed these meds in past: None noted  Patient is currently controlled, except for TG and HDL on the following medications:  . Atorvastatin 33m daily (patient only taking 455mand has done this for a long time) . Fish Oil 100068maily . Coenzyme Q10 100m51mily  States Clayton has an appt with Dr. CrenStanford Breedtates Clayton has enough atorvastatin to last a year.  Atorvastatin Fill Date 03/01/20 - 90 DS   Diet: Denies eating a lot fried foods.  Admits Clayton does like to eat sweets. "Anything sweet". Normally has a cookie in the morning. B - Cereal (bran flakes, raisen bran, oatmeal) L - peanut butter crackers and banana sandwich D - 3 veggies and a meat Snacks - "not normally" if Clayton does Clayton eats peanut butter crackers  Exercise: Plays golf 3 days a week, and gym 3 days a week.  [facility] [employer].  We discussed:  diet and exercise extensively  Plan -Continue current medications  Diabetes    A1c goal <7%  Recent Relevant Labs: Lab Results  Component Value Date/Time   HGBA1C 6.8 (H) 02/07/2020 08:15 AM   HGBA1C 6.9 (H) 07/28/2019 09:32 AM   GFR 40.58 (L) 02/07/2020 08:15 AM   GFR 40.63 (L) 07/28/2019 09:32 AM    Last diabetic Eye exam:  Lab Results  Component Value Date/Time   HMDIABEYEEXA No Retinopathy 05/22/2020 12:00 AM    Last diabetic Foot exam:  Lab Results  Component Value Date/Time   HMDIABFOOTEX Normal/[facility] House Calls 01/17/2020 12:00 AM    Patient has failed these meds in past: None noted  Patient is currently controlled on the following medications: . None  We discussed: diet and exercise extensively  Plan -Continue control with diet and exercise   AFIB   Patient is currently rate controlled.  Patient has failed these meds in past: None noted  Patient is currently controlled on the following medications:   Metoprolol succinate 25mg58mly  Xarelto 15mg 67my (gets through mail order because it's cheaper)  Denies any symptoms  Plan -Continue current medications   Heart Failure   Type: Combined Systolic and Diastolic  Last ejection fraction: 11/28/2016 LVEF 40-45% NYHA Class: I (no actitivty limitation) AHA HF Stage: C (Heart disease and symptoms present)  Patient has failed these meds in past: None noted  Patient is currently controlled on the following  medications:   Furosemide 31m daily  Metoprolol succinate 243mdaily  Reports Clayton has swelling in both ankles, but it it is at baseline level.  Denies SOB. Reports Clayton weighs daily. Weighing around 220lbs.  States Clayton stays within 1-1.5lbs each Wilson Sample.  We discussed weighing daily; if you gain more than 3 pounds in one Meriam Chojnowski or 5 pounds in one week call your doctor  Plan -Continue current medications   GERD   Patient has failed these meds in past: None noted  Patient is currently controlled on the following medications:  . Pantoprazole 4078mWF  Breakthrough Sx:  None Breakthrough Tx: N/A Triggers: None Clayton can identify  Plan -Continue current medications  BPH   PSA  Date Value Ref Range Status  11/27/2018 2.70 0.10 - 4.00 ng/mL Final    Comment:    Test performed using Access Hybritech PSA Assay, a parmagnetic partical, chemiluminecent immunoassay.  07/15/2016 2.25 0.10 - 4.00 ng/mL Final  04/19/2008 1.18 0.10 - 4.00 ng/mL Final     Patient has failed these meds in past: None noted  Patient is currently controlled on the following medications:  . Tamsulosin 0.4mg40mily . Urinozinc  Overnight Urination: Rarely Bladder Emptying: Yes Solid Stream: Mix of steady stream and dribbling   Plan -Continue current medications   Pain   Patient has failed these meds in past: None noted  Patient is currently stable on the following medications: . Tramadol 50mg49mee times daily as needed . Tylenol PM 25-500mg 31mS  States Clayton still hurts when Clayton plays golf, but Clayton pushes through the pain. States Clayton went to Dr. O'ToolIsabelle Coursee to inquire about getting scheduled for visit, but has not been scheduled.  States Clayton turned his mattress and this has helped some. Clayton would still like to get scheduled with Dr. O'ToolFrancesco Runnerlled Dr. O'ToolIsabelle Coursee during visit to coordinate scheduling. Coordinated to have patient scheduled with Dr. O'ToolFrancesco Runnernday, Sept. 13 at 9:45am  Plan -Continue current medications  -Keep appt with Dr. O'ToolFrancesco Runner13/21  Medication Management   [facility] Uses pill box? No - too much of a problem (has a drawer for prescriptions and a drawer for vitamins) Pt endorses 100% compliance  We discussed: Option of [employer], but patient likes his current system of organizing medication  Plan -Continue current medication management strategy   Follow up:  -1 month HTN assessment  -3 month phone visit   Miscellaneous Meds  Melatonin 5mg  M59mivitamin Iron 65mg  d55m Turmeric 1500mg dai54mMeds to D/C from list  Vitamin D 1000 units daily (no longer taking) Diclofenac 1% gel (no longer taking) Vitamin B12 1000mcg dai72mno longer taking)

## 2020-07-24 NOTE — Patient Instructions (Signed)
Visit Information  Goals Addressed            This Visit's Progress   . Chronic Care Management Pharmacy Care Plan       CARE PLAN ENTRY (see longitudinal plan of care for additional care plan information)  Current Barriers:  . Chronic Disease Management support, education, and care coordination needs related to Hypertension, Hyperlipidemia/Coronary Atherosclerosis, Diabetes, Afib, Heart Failure, GERD, BPH, Pain   Hypertension BP Readings from Last 3 Encounters:  06/28/20 137/75  04/26/20 (!) 151/80  04/22/20 (!) 156/111   . Pharmacist Clinical Goal(s): o Over the next 90 days, patient will work with PharmD and providers to achieve BP goal <140/90 . Current regimen:  . Benazepril 10mg  #2 tabs daily AM . Metoprolol succinate 25mg  daily AM . Interventions: o Requested patient to check BP 2-3 times per week and record . Patient self care activities - Over the next 90 days, patient will: o Check BP 2-3 times per week, document, and provide at future appointments o Ensure daily salt intake < 2300 mg/Merrit Friesen  Hyperlipidemia Lab Results  Component Value Date/Time   LDLCALC 57 02/24/2019 10:29 AM   LDLDIRECT 46.0 02/07/2020 08:15 AM   . Pharmacist Clinical Goal(s): o Over the next 90 days, patient will work with PharmD and providers to maintain LDL goal < 70  . Current regimen:  . Atorvastatin 80mg  1/2 tab (40mg ) daily  . Fish Oil 1000mg  daily . Coenzyme Q10 100mg  daily . Interventions: o Requested that patient inform Dr. Stanford Breed that he is only taking 1/2 tab of atorvastatin to help align fill dates appropriately when prescribing . Patient self care activities - Over the next 90 days, patient will: o Get atorvastatin script updated to reflect how he takes it  o Maintain cholesterol medication regimen.   Diabetes Lab Results  Component Value Date/Time   HGBA1C 6.8 (H) 02/07/2020 08:15 AM   HGBA1C 6.9 (H) 07/28/2019 09:32 AM   . Pharmacist Clinical Goal(s): o Over the  next 90 days, patient will work with PharmD and providers to maintain A1c goal <7% . Current regimen:  o Diet and exercise management   . Interventions: o Discussed diet and exercise . Patient self care activities - Over the next 90 days, patient will: o Maintain a1c <7%  Pain . Pharmacist Clinical Goal(s) o Over the next 90 days, patient will work with PharmD and providers to reduce symptoms of pain . Current regimen:  . Tramadol 50mg  three times daily as needed . Tylenol PM 25-500mg  #2 HS . Interventions: o Coordinated appt with Dr. Isabelle Course office for 08/21/2020 . Patient self care activities - Over the next 90 days, patient will: o Keep appt with Dr. Francesco Runner  Medication management . Pharmacist Clinical Goal(s): o Over the next 90 days, patient will work with PharmD and providers to achieve optimal medication adherence . Current pharmacy: [facility] . Interventions o Comprehensive medication review performed. o Continue current medication management strategy . Patient self care activities - Over the next 90 days, patient will: o Focus on medication adherence by filling and taking medications appropriately  o Take medications as prescribed o Report any questions or concerns to PharmD and/or provider(s)  Initial goal documentation        [name] was given information about Chronic Care Management services today including:  1. CCM service includes personalized support from designated clinical staff supervised by his physician, including individualized plan of care and coordination with other care providers 2. 24/7 contact phone  numbers for assistance for urgent and routine care needs. 3. Standard insurance, coinsurance, copays and deductibles apply for chronic care management only during months in which we provide at least 20 minutes of these services. Most insurances cover these services at 100%, however patients may be responsible for any copay, coinsurance and/or deductible  if applicable. This service may help you avoid the need for more expensive face-to-face services. 4. Only one practitioner may furnish and bill the service in a calendar month. 5. The patient may stop CCM services at any time (effective at the end of the month) by phone call to the office staff.  Patient agreed to services and verbal consent obtained.   The patient verbalized understanding of instructions provided today and agreed to receive a mailed copy of patient instruction and/or educational materials. Telephone follow up appointment with pharmacy team member scheduled for: 10/24/2020  [name], PharmD Clinical Pharmacist [facility] [phone]   How to Take Your Blood Pressure You can take your blood pressure at home with a machine. You may need to check your blood pressure at home:  To check if you have high blood pressure (hypertension).  To check your blood pressure over time.  To make sure your blood pressure medicine is working. Supplies needed: You will need a blood pressure machine, or monitor. You can buy one at a drugstore or online. When choosing one:  Choose one with an arm cuff.  Choose one that wraps around your upper arm. Only one finger should fit between your arm and the cuff.  Do not choose one that measures your blood pressure from your wrist or finger. Your doctor can suggest a monitor. How to prepare Avoid these things for 30 minutes before checking your blood pressure:  Drinking caffeine.  Drinking alcohol.  Eating.  Smoking.  Exercising. Five minutes before checking your blood pressure:  Pee.  Sit in a dining chair. Avoid sitting in a soft couch or armchair.  Be quiet. Do not talk. How to take your blood pressure Follow the instructions that came with your machine. If you have a digital blood pressure monitor, these may be the instructions: 1. Sit up straight. 2. Place your feet on the floor. Do  not cross your ankles or legs. 3. Rest your left arm at the level of your heart. You may rest it on a table, desk, or chair. 4. Pull up your shirt sleeve. 5. Wrap the blood pressure cuff around the upper part of your left arm. The cuff should be 1 inch (2.5 cm) above your elbow. It is best to wrap the cuff around bare skin. 6. Fit the cuff snugly around your arm. You should be able to place only one finger between the cuff and your arm. 7. Put the cord inside the groove of your elbow. 8. Press the power button. 9. Sit quietly while the cuff fills with air and loses air. 10. Write down the numbers on the screen. 11. Wait 2-3 minutes and then repeat steps 1-10. What do the numbers mean? Two numbers make up your blood pressure. The first number is called systolic pressure. The second is called diastolic pressure. An example of a blood pressure reading is "120 over 80" (or 120/80). If you are an adult and do not have a medical condition, use this guide to find out if your blood pressure is normal: Normal  First number: below 120.  Second number: below 80. Elevated  First number: 120-129.  Second number: below 80. Hypertension stage 1  First number: 130-139.  Second number: 80-89. Hypertension stage 2  First number: 140 or above.  Second number: 36 or above. Your blood pressure is above normal even if only the top or bottom number is above normal. Follow these instructions at home:  Check your blood pressure as often as your doctor tells you to.  Take your monitor to your next doctor's appointment. Your doctor will: ? Make sure you are using it correctly. ? Make sure it is working right.  Make sure you understand what your blood pressure numbers should be.  Tell your doctor if your medicines are causing side effects. Contact a doctor if:  Your blood pressure keeps being high. Get help right away if:  Your first blood pressure number is higher than 180.  Your second  blood pressure number is higher than 120. This information is not intended to replace advice given to you by your health care provider. Make sure you discuss any questions you have with your health care provider. Document Revised: 11/07/2017 Document Reviewed: 05/03/2016 Elsevier Patient Education  2020 [employer].

## 2020-07-31 ENCOUNTER — Encounter: Payer: Self-pay | Admitting: Internal Medicine

## 2020-07-31 ENCOUNTER — Other Ambulatory Visit: Payer: Self-pay

## 2020-07-31 ENCOUNTER — Ambulatory Visit (INDEPENDENT_AMBULATORY_CARE_PROVIDER_SITE_OTHER): Payer: Medicare Other | Admitting: Internal Medicine

## 2020-07-31 VITALS — BP 143/73 | HR 58 | Temp 97.7°F | Resp 16 | Ht 71.0 in | Wt 225.4 lb

## 2020-07-31 DIAGNOSIS — I10 Essential (primary) hypertension: Secondary | ICD-10-CM

## 2020-07-31 DIAGNOSIS — E7800 Pure hypercholesterolemia, unspecified: Secondary | ICD-10-CM

## 2020-07-31 DIAGNOSIS — I4821 Permanent atrial fibrillation: Secondary | ICD-10-CM | POA: Diagnosis not present

## 2020-07-31 DIAGNOSIS — E1159 Type 2 diabetes mellitus with other circulatory complications: Secondary | ICD-10-CM

## 2020-07-31 DIAGNOSIS — R399 Unspecified symptoms and signs involving the genitourinary system: Secondary | ICD-10-CM | POA: Diagnosis not present

## 2020-07-31 LAB — BASIC METABOLIC PANEL
BUN: 19 mg/dL (ref 6–23)
CO2: 27 mEq/L (ref 19–32)
Calcium: 9.5 mg/dL (ref 8.4–10.5)
Chloride: 103 mEq/L (ref 96–112)
Creatinine, Ser: 1.41 mg/dL (ref 0.40–1.50)
GFR: 47.58 mL/min — ABNORMAL LOW (ref >60.00)
Glucose, Bld: 139 mg/dL — ABNORMAL HIGH (ref 70–99)
Potassium: 4.3 mEq/L (ref 3.5–5.1)
Sodium: 140 mEq/L (ref 135–145)

## 2020-07-31 LAB — PSA: PSA: 2.14 ng/mL (ref 0.10–4.00)

## 2020-07-31 LAB — HEMOGLOBIN A1C: Hgb A1c MFr Bld: 7.2 % — ABNORMAL HIGH (ref 4.6–6.5)

## 2020-07-31 NOTE — Progress Notes (Signed)
Pre visit review using our clinic review tool, if applicable. No additional management support is needed unless otherwise documented below in the visit note. 

## 2020-07-31 NOTE — Assessment & Plan Note (Signed)
DM: Diet controlled, check A1c.  He remains active, playing golf HTN: Currently on Lotensin, Lasix, metoprolol.  BPs at home has been slightly elevated lately, today 143/73.  Will check BMP rec to continue monitoring. Hyperlipidemia: He was recommended to take Lipitor 80 mg, decided to take only half tablet, "I just don't like to to take more than 40 mg".  He is concerned about myalgias. CRI: Checking labs CAD, atrial fibrillation: On Xarelto, seems controlled.  Asymptomatic MSK: Having back pain, to have a local injection next month. BPH, LUTS: On and off symptoms, DRE show a small nodule on the right, similar to 2019, checking a PSA. Recommend a flu shot this fall RTC 4  months

## 2020-07-31 NOTE — Progress Notes (Signed)
Subjective:    Patient ID: [name], male    DOB: June 17, 1931, 86 y.o.   MRN: [id number]  DOS:  07/31/2020 Type of visit - description: Routine visit In general doing okay. Is having back issues. We talk about high cholesterol, hypertension, BPH. Continue with urinary frequency on and off, some days he "has to go" every hour , some days he can hold it without problems.   Review of Systems See above   Past Medical History:  Diagnosis Date  . Anemia   . Antral gastritis 2013   EGD   . Atrial fibrillation ([place])   . CAD (coronary artery disease)    s/p CABG 1993 (L-LAD, S-RI, S-D1);   echo 1/10: EF 55%;    myoview 12/09: inf MI, no ischemia  . Candida esophagitis ([place]) 2013   EGD   . Cataracts, bilateral   . Family history of malignant neoplasm of gastrointestinal tract   . Folliculitis   . Hiatal hernia   . HTN (hypertension)   . Hyperlipidemia   . Irritable bladder   . Myocardial infarction ([place])   . Obesity   . Osteoarthritis     Past Surgical History:  Procedure Laterality Date  . CATARACT EXTRACTION Right 07/21/2018  . CHOLECYSTECTOMY     laparoscopic '92  . CORONARY ARTERY BYPASS GRAFT     graft '92: LIMA-LAD, SVG - D2,R1, D1  . ENTEROSCOPY  08/22/2012   Procedure: ENTEROSCOPY;  Surgeon: Juanita Craver, MD;  Location: [facility];  Service: Endoscopy;  Laterality: N/A;  . INTRAOCULAR LENS EXCHANGE Right 07/21/2018  . KNEE SURGERY    . MOLE REMOVAL  2001 and 2008  . PILONIDAL CYST EXCISION      Allergies as of 07/31/2020   No Known Allergies     Medication List       Accurate as of July 31, 2020  8:24 PM. If you have any questions, ask your nurse or doctor.        atorvastatin 80 MG tablet Commonly known as: LIPITOR Take 0.5 tablets (40 mg total) by mouth daily. What changed: how much to take Changed by: Kathlene November, MD   benazepril 10 MG tablet Commonly known as: LOTENSIN Take 2 tablets (20 mg total) by mouth daily.   CoQ10 100 MG Caps Take 1  capsule by mouth daily.   diphenhydramine-acetaminophen 25-500 MG Tabs tablet Commonly known as: TYLENOL PM Take 2 tablets by mouth at bedtime.   furosemide 40 MG tablet Commonly known as: LASIX Take 1 tablet (40 mg total) by mouth daily.   Melatonin 10 MG Tabs Take 10 mg by mouth at bedtime.   metoprolol succinate 25 MG 24 hr tablet Commonly known as: TOPROL-XL Take 1 tablet (25 mg total) by mouth daily.   multivitamin with minerals Tabs tablet Take 1 tablet by mouth daily.   pantoprazole 40 MG tablet Commonly known as: PROTONIX Take 1 tablet (40 mg total) by mouth daily.   Rivaroxaban 15 MG Tabs tablet Commonly known as: XARELTO Take 1 tablet (15 mg total) by mouth daily with supper.   tamsulosin 0.4 MG Caps capsule Commonly known as: FLOMAX Take 1 capsule (0.4 mg total) by mouth daily.   traMADol 50 MG tablet Commonly known as: ULTRAM Take 1 tablet (50 mg total) by mouth 3 (three) times daily as needed.          Objective:   Physical Exam BP (!) 143/73 (BP Location: Left Arm, Patient Position: Sitting, Cuff Size:  Normal)   Pulse (!) 58   Temp 97.7 F (36.5 C) (Oral)   Resp 16   Ht 5\' 11"  (1.803 m)   Wt 225 lb 6 oz (102.2 kg)   SpO2 94%   BMI 31.43 kg/m  General:   Well developed, NAD, BMI noted.  HEENT:  Normocephalic . Face symmetric, atraumatic Lungs:  CTA B Normal respiratory effort, no intercostal retractions, no accessory muscle use. Heart: RRR,  no murmur.  Abdomen:  Not distended, soft, non-tender. No organomegaly DRE: Prostate is soft, slightly larger on the left?  He has a nodule at the distal right side, ~ 3 mm in size Skin: Not pale. Not jaundice Lower extremities: no pretibial edema bilaterally  Neurologic:  alert & oriented X3.  Speech normal, gait appropriate for age and unassisted Psych--  Cognition and judgment appear intact.  Cooperative with normal attention span and concentration.  Behavior appropriate. No anxious or  depressed appearing.     Assessment    Assessment  DM: diet control, no neuropathy HTN Hyperlipidemia CRI:  CV: --CAD, CABG 1993, Myoview 2009 no ischemia. --Atrial fibrillation -- rate control, xarelto -- chronic combined systolic/diastolic congestive heart failure Venous insufficiency, mild LE edema L>>R  LUTS:  DRE-PSA   MSK: DJD, spinal stenosis, R wrist Fx in the 80s GI: --Recurrent melena: Work-up 2013 Dr. Sharlett Iles: Colonoscopy, EGD, capsule endoscopy.  Felt to be due to NSAIDs --Candida esophagitis, antral gastritis --->  2013 per EGD --HH Sees derm x 2/year  PLAN: DM: Diet controlled, check A1c.  He remains active, playing golf HTN: Currently on Lotensin, Lasix, metoprolol.  BPs at home has been slightly elevated lately, today 143/73.  Will check BMP rec to continue monitoring. Hyperlipidemia: He was recommended to take Lipitor 80 mg, decided to take only half tablet, "I just don't like to to take more than 40 mg".  He is concerned about myalgias. CRI: Checking labs CAD, atrial fibrillation: On Xarelto, seems controlled.  Asymptomatic MSK: Having back pain, to have a local injection next month. BPH, LUTS: On and off symptoms, DRE show a small nodule on the right, similar to 2019, checking a PSA. Recommend a flu shot this fall RTC 4  months    This visit occurred during the SARS-CoV-2 public health emergency.  Safety protocols were in place, including screening questions prior to the visit, additional usage of staff PPE, and extensive cleaning of exam room while observing appropriate contact time as indicated for disinfecting solutions.

## 2020-07-31 NOTE — Patient Instructions (Addendum)
Check the  blood pressure regularly  BP GOAL is between 110/65 and  135/85. If it is consistently higher or lower, let me know  Stay active, watch her salt intake.  Get a flu shot this fall  GO TO THE LAB : Get the blood work     [place], [place] back for a physical exam in 4 months, fasting

## 2020-08-10 ENCOUNTER — Other Ambulatory Visit: Payer: Self-pay | Admitting: Internal Medicine

## 2020-08-10 DIAGNOSIS — I4891 Unspecified atrial fibrillation: Secondary | ICD-10-CM

## 2020-08-21 DIAGNOSIS — M48061 Spinal stenosis, lumbar region without neurogenic claudication: Secondary | ICD-10-CM | POA: Diagnosis not present

## 2020-08-21 DIAGNOSIS — D040 Carcinoma in situ of skin of lip: Secondary | ICD-10-CM | POA: Diagnosis not present

## 2020-08-21 DIAGNOSIS — M7918 Myalgia, other site: Secondary | ICD-10-CM | POA: Diagnosis not present

## 2020-08-21 DIAGNOSIS — D692 Other nonthrombocytopenic purpura: Secondary | ICD-10-CM | POA: Diagnosis not present

## 2020-08-21 DIAGNOSIS — M5136 Other intervertebral disc degeneration, lumbar region: Secondary | ICD-10-CM | POA: Diagnosis not present

## 2020-08-21 DIAGNOSIS — L570 Actinic keratosis: Secondary | ICD-10-CM | POA: Diagnosis not present

## 2020-08-21 DIAGNOSIS — D0439 Carcinoma in situ of skin of other parts of face: Secondary | ICD-10-CM | POA: Diagnosis not present

## 2020-08-21 DIAGNOSIS — M47816 Spondylosis without myelopathy or radiculopathy, lumbar region: Secondary | ICD-10-CM | POA: Diagnosis not present

## 2020-08-21 DIAGNOSIS — M545 Low back pain: Secondary | ICD-10-CM | POA: Diagnosis not present

## 2020-08-21 DIAGNOSIS — Z859 Personal history of malignant neoplasm, unspecified: Secondary | ICD-10-CM | POA: Diagnosis not present

## 2020-08-23 ENCOUNTER — Telehealth: Payer: Self-pay | Admitting: Pharmacist

## 2020-08-23 NOTE — Progress Notes (Addendum)
Chronic Care Management Pharmacy Assistant   Name: [name]  MRN: [id number] DOB: 12-26-1930  Reason for Encounter: Disease State  Patient Questions:  1.  Have you seen any other providers since your last visit? Yes  2.  Any changes in your medicines or health? Yes  PCP : Colon Branch, MD   Their chronic conditions include: Hypertension, Hyperlipidemia/Coronary Atherosclerosis, Diabetes, Afib, Heart Failure, GERD, BPH, Pain   Office Visits: 08-23-2020 (PCP) Patient presented in the office for a routine visit C/o of back issues and urinary frequency. Patient reported only taking 40 mg of Lipitor as opposed to the recommended 80 mg dose. Patient stated " "I just don't like to take more than 40 mg". DRE show a small nodule on the right, similar to 2019, checking a PSA. Patient was advised to stay active and watch salt intake.  No Consults or Hospitalizations since their last CCM visit with the clinical pharmacist.   Allergies:  No Known Allergies  Medications: Outpatient Encounter Medications as of 08/23/2020  Medication Sig Note  . atorvastatin (LIPITOR) 80 MG tablet Take 0.5 tablets (40 mg total) by mouth daily.   . benazepril (LOTENSIN) 10 MG tablet Take 2 tablets (20 mg total) by mouth daily. 07/24/2020: Would like to get 20mg  tab at next refill  . Coenzyme Q10 (COQ10) 100 MG CAPS Take 1 capsule by mouth daily.    . diphenhydramine-acetaminophen (TYLENOL PM) 25-500 MG TABS tablet Take 2 tablets by mouth at bedtime.   . furosemide (LASIX) 40 MG tablet Take 1 tablet (40 mg total) by mouth daily.   . Melatonin 10 MG TABS Take 10 mg by mouth at bedtime.    . metoprolol succinate (TOPROL-XL) 25 MG 24 hr tablet Take 1 tablet (25 mg total) by mouth daily.   . Multiple Vitamin (MULTIVITAMIN WITH MINERALS) TABS tablet Take 1 tablet by mouth daily.   . pantoprazole (PROTONIX) 40 MG tablet Take 1 tablet (40 mg total) by mouth daily.   . Rivaroxaban (XARELTO) 15 MG TABS tablet Take 1  tablet (15 mg total) by mouth daily with supper.   . tamsulosin (FLOMAX) 0.4 MG CAPS capsule Take 1 capsule (0.4 mg total) by mouth daily.   . traMADol (ULTRAM) 50 MG tablet Take 1 tablet (50 mg total) by mouth 3 (three) times daily as needed. 07/24/2020: Uses as needed mornings that he plays golf   No facility-administered encounter medications on file as of 08/23/2020.    Current Diagnosis: Patient Active Problem List   Diagnosis Date Noted  . Left hip pain 10/18/2019  . Lumbar facet joint syndrome 04/16/2019  . CHF (congestive heart failure) ([place]) 12/17/2016  . PCP NOTES >>>>> 10/11/2015  . Carpal tunnel syndrome 02/15/2014  . Anemia 08/14/2012  . Annual physical exam 01/08/2012  . Atrial fibrillation ([place]) 03/29/2011  . Venous (peripheral) insufficiency 09/04/2009  . OBESITY 07/04/2009  . DJD (degenerative joint disease) 07/04/2009  . SLEEP DISORDER 07/04/2009  . Dyspepsia 02/28/2009  . HEMATOCHEZIA 02/07/2009  . DM II (diabetes mellitus, type II), controlled ([place]) 04/25/2008  . Hyperlipidemia 06/25/2007  . Essential hypertension 06/25/2007  . Coronary atherosclerosis 06/25/2007    Goals Addressed   None    Reviewed chart prior to disease state call. Spoke with patient regarding BP  Recent Office Vitals: BP Readings from Last 3 Encounters:  07/31/20 (!) 143/73  06/28/20 137/75  04/26/20 (!) 151/80   Pulse Readings from Last 3 Encounters:  07/31/20 (!) 58  06/28/20  61  04/26/20 (!) 50    Wt Readings from Last 3 Encounters:  07/31/20 225 lb 6 oz (102.2 kg)  06/28/20 221 lb (100.2 kg)  04/26/20 225 lb 6 oz (102.2 kg)     Kidney Function Lab Results  Component Value Date/Time   CREATININE 1.41 07/31/2020 11:54 AM   CREATININE 1.46 (H) 04/22/2020 10:22 AM   CREATININE 1.74 (H) 09/16/2018 02:59 PM   CREATININE 1.49 (H) 01/30/2017 10:07 AM   GFR 47.58 (L) 07/31/2020 11:54 AM   GFRNONAA 43 (L) 04/22/2020 10:22 AM   GFRNONAA 52 (L) 10/06/2013 05:03 PM   GFRAA  50 (L) 04/22/2020 10:22 AM   GFRAA 60 10/06/2013 05:03 PM    BMP Latest Ref Rng & Units 07/31/2020 04/22/2020 02/07/2020  Glucose 70 - 99 mg/dL 139(H) 193(H) 153(H)  BUN 6 - 23 mg/dL 19 22 16   Creatinine 0.40 - 1.50 mg/dL 1.41 1.46(H) 1.62(H)  BUN/Creat Ratio 6 - 22 (calc) - - -  Sodium 135 - 145 mEq/L 140 140 142  Potassium 3.5 - 5.1 mEq/L 4.3 4.0 4.1  Chloride 96 - 112 mEq/L 103 104 105  CO2 19 - 32 mEq/L 27 24 28   Calcium 8.4 - 10.5 mg/dL 9.5 9.2 9.2    . Current antihypertensive regimen:   Benazepril 10mg  two tabs daily AM  Metoprolol succinate 25mg  daily AM  . How often are you checking your Blood Pressure? weekly . Current home BP readings: Patient states his systolic ranges has been between 150-140 . What recent interventions/DTPs have been made by any provider to improve Blood Pressure control since last CPP Visit: none at this time . Any recent hospitalizations or ED visits since last visit with CPP? No . What diet changes have been made to improve Blood Pressure Control?  o Patient states he has decreased his salt intake. Reports losing 4-5 lbs over the past month. . What exercise is being done to improve your Blood Pressure Control?  o Patient states he goes to the [facility] and plays golf three times a week.   Patient asked if he would could have one 20 mg tab of Benazepril versus taking two 10 mg tabs.  Adherence Review: Is the patient currently on ACE/ARB medication? Yes Benazepril Does the patient have >5 day gap between last estimated fill dates? No (for benazepril, but yes for atorvastatin. Atorvastatin last filled 04/18/20? -[facility])   Follow-Up:  Pharmacist Review   Fanny Skates, [place] Pharmacist Assistant [phone]  Will coordinate with primary care team to get patient 20mg  tab of benazepril.  Will need to follow up with patient regarding statin use. -MCEY Reviewed by: De Blanch, PharmD Clinical Pharmacist [facility] [phone]

## 2020-08-24 ENCOUNTER — Other Ambulatory Visit: Payer: Self-pay | Admitting: Internal Medicine

## 2020-08-31 ENCOUNTER — Other Ambulatory Visit: Payer: Self-pay

## 2020-08-31 MED ORDER — BENAZEPRIL HCL 20 MG PO TABS
20.0000 mg | ORAL_TABLET | Freq: Every day | ORAL | 1 refills | Status: DC
Start: 2020-08-31 — End: 2020-10-04

## 2020-09-07 DIAGNOSIS — M19071 Primary osteoarthritis, right ankle and foot: Secondary | ICD-10-CM | POA: Diagnosis not present

## 2020-09-07 DIAGNOSIS — M19072 Primary osteoarthritis, left ankle and foot: Secondary | ICD-10-CM | POA: Diagnosis not present

## 2020-09-07 DIAGNOSIS — M25571 Pain in right ankle and joints of right foot: Secondary | ICD-10-CM | POA: Diagnosis not present

## 2020-09-07 DIAGNOSIS — M25572 Pain in left ankle and joints of left foot: Secondary | ICD-10-CM | POA: Diagnosis not present

## 2020-09-19 DIAGNOSIS — D23122 Other benign neoplasm of skin of left lower eyelid, including canthus: Secondary | ICD-10-CM | POA: Diagnosis not present

## 2020-09-19 DIAGNOSIS — H029 Unspecified disorder of eyelid: Secondary | ICD-10-CM | POA: Diagnosis not present

## 2020-09-28 NOTE — Progress Notes (Signed)
HPI: FU coronary artery disease and atrial fibrillation. Patient is status post coronary artery bypassing graft in 1993 with a LIMA to the LAD, saphenous vein graft to the ramus intermedius and saphenous vein graft to the first diagonal. CT of the abdomen in January of 2009 showed no renal artery stenosis. There was no aortic aneurysm. Nuclear study in June 2014 showed scar in the mid/apical inferior wall and apex. There was a small area of ischemia in the base to mid anterior wall. The study was unchanged compared to May of 2012. The study was not gated. We have treated medically. Echo 12/17 showed EF 40-45, mild MR, severe LAE, moderate RAE, mild RVE, mild TR. Since last seen,he denies dyspnea, chest pain, palpitations or syncope.  Chronic mild pedal edema left greater than right.  Current Outpatient Medications  Medication Sig Dispense Refill  . atorvastatin (LIPITOR) 80 MG tablet Take 0.5 tablets (40 mg total) by mouth daily.    . benazepril (LOTENSIN) 20 MG tablet Take 1 tablet (20 mg total) by mouth daily. 90 tablet 1  . Coenzyme Q10 (COQ10) 100 MG CAPS Take 1 capsule by mouth daily.     . diphenhydramine-acetaminophen (TYLENOL PM) 25-500 MG TABS tablet Take 2 tablets by mouth at bedtime.    . furosemide (LASIX) 40 MG tablet Take 1 tablet (40 mg total) by mouth daily. 90 tablet 1  . Melatonin 10 MG TABS Take 10 mg by mouth at bedtime.     . metoprolol succinate (TOPROL-XL) 25 MG 24 hr tablet Take 1 tablet (25 mg total) by mouth daily. 90 tablet 1  . Multiple Vitamin (MULTIVITAMIN WITH MINERALS) TABS tablet Take 1 tablet by mouth daily.    . pantoprazole (PROTONIX) 40 MG tablet Take 1 tablet (40 mg total) by mouth daily. 90 tablet 3  . Rivaroxaban (XARELTO) 15 MG TABS tablet Take 1 tablet (15 mg total) by mouth daily with supper. 90 tablet 3  . tamsulosin (FLOMAX) 0.4 MG CAPS capsule Take 1 capsule (0.4 mg total) by mouth daily. 90 capsule 3  . traMADol (ULTRAM) 50 MG tablet Take 1  tablet (50 mg total) by mouth 3 (three) times daily as needed. 30 tablet 0   No current facility-administered medications for this visit.     Past Medical History:  Diagnosis Date  . Anemia   . Antral gastritis 2013   EGD   . Atrial fibrillation ([place])   . CAD (coronary artery disease)    s/p CABG 1993 (L-LAD, S-RI, S-D1);   echo 1/10: EF 55%;    myoview 12/09: inf MI, no ischemia  . Candida esophagitis ([place]) 2013   EGD   . Cataracts, bilateral   . Family history of malignant neoplasm of gastrointestinal tract   . Folliculitis   . Hiatal hernia   . HTN (hypertension)   . Hyperlipidemia   . Irritable bladder   . Myocardial infarction ([place])   . Obesity   . Osteoarthritis     Past Surgical History:  Procedure Laterality Date  . CATARACT EXTRACTION Right 07/21/2018  . CHOLECYSTECTOMY     laparoscopic '92  . CORONARY ARTERY BYPASS GRAFT     graft '92: LIMA-LAD, SVG - D2,R1, D1  . ENTEROSCOPY  08/22/2012   Procedure: ENTEROSCOPY;  Surgeon: Juanita Craver, MD;  Location: [facility];  Service: Endoscopy;  Laterality: N/A;  . INTRAOCULAR LENS EXCHANGE Right 07/21/2018  . KNEE SURGERY    . MOLE REMOVAL  2001 and 2008  .  PILONIDAL CYST EXCISION      Social History   Socioeconomic History  . Marital status: Widowed    Spouse name: Not on file  . Number of children: 1  . Years of education: Not on file  . Highest education level: Not on file  Occupational History  . Occupation: Retired    Fish farm manager: RETIRED  Tobacco Use  . Smoking status: Former Smoker    Years: 16.00    Quit date: 08/14/1964    Years since quitting: 56.1  . Smokeless tobacco: Never Used  . Tobacco comment: quit 1965  Vaping Use  . Vaping Use: Never used  Substance and Sexual Activity  . Alcohol use: Yes    Alcohol/week: 7.0 standard drinks    Types: 7 Shots of liquor per week    Comment: brandy at night  . Drug use: No  . Sexual activity: Not on file  Other Topics Concern  . Not on file  Social  History Narrative   Lost his only child   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 Married '57- widowed 02/28/23. 1 son -'68- died '48 MVA.    Lives by himself    Emergency contact: see DPR, brother and  Daron Offer [phone] (friend)   Social Determinants of Health   Financial Resource Strain: Low Risk   . Difficulty of Paying Living Expenses: Not hard at all  Food Insecurity: No Food Insecurity  . Worried About Charity fundraiser in the Last Year: Never true  . Ran Out of Food in the Last Year: Never true  Transportation Needs: No Transportation Needs  . Lack of Transportation (Medical): No  . Lack of Transportation (Non-Medical): No  Physical Activity:   . Days of Exercise per Week: Not on file  . Minutes of Exercise per Session: Not on file  Stress:   . Feeling of Stress : Not on file  Social Connections:   . Frequency of Communication with Friends and Family: Not on file  . Frequency of Social Gatherings with Friends and Family: Not on file  . Attends Religious Services: Not on file  . Active Member of Clubs or Organizations: Not on file  . Attends Archivist Meetings: Not on file  . Marital Status: Not on file  Intimate Partner Violence:   . Fear of Current or Ex-Partner: Not on file  . Emotionally Abused: Not on file  . Physically Abused: Not on file  . Sexually Abused: Not on file    Family History  Problem Relation Age of Onset  . Coronary artery disease Father        and brother  . Heart attack Father        and brother-fatal  . Stroke Father   . Breast cancer Mother   . Alzheimer's disease Mother   . Colon cancer Maternal Uncle   . Esophageal cancer Neg Hx   . Stomach cancer Neg Hx   . Rectal cancer Neg Hx   . Prostate cancer Neg Hx     ROS: no fevers or chills, productive cough, hemoptysis, dysphasia, odynophagia, melena, hematochezia, dysuria, hematuria, rash, seizure activity, orthopnea, PND, claudication. Remaining systems are negative.  Physical  Exam: Well-developed well-nourished in no acute distress.  Skin is warm and dry.  HEENT is normal.  Neck is supple.  Chest is clear to auscultation with normal expansion.  Cardiovascular exam is irregular Abdominal exam nontender or distended. No masses palpated. Extremities show trace edema on the right and  1+ on the left neuro grossly intact  A/P  1 permanent atrial fibrillation-continue Xarelto and beta-blocker at present dose.  2 hypertension-patient's blood pressure is elevated; increase benazepril to 40 mg daily.  In 1 week check potassium and renal function.  3 coronary artery disease-no chest pain.  Continue statin.  No aspirin given need for Xarelto.  4 hyperlipidemia-continue statin.  5 chronic combined systolic/diastolic congestive heart failure-patient appears to be euvolemic.  Continue diuretic at present dose.  Kirk Ruths, MD

## 2020-10-03 DIAGNOSIS — M19072 Primary osteoarthritis, left ankle and foot: Secondary | ICD-10-CM | POA: Diagnosis not present

## 2020-10-03 DIAGNOSIS — M19071 Primary osteoarthritis, right ankle and foot: Secondary | ICD-10-CM | POA: Diagnosis not present

## 2020-10-04 ENCOUNTER — Encounter: Payer: Self-pay | Admitting: Cardiology

## 2020-10-04 ENCOUNTER — Other Ambulatory Visit: Payer: Self-pay

## 2020-10-04 ENCOUNTER — Ambulatory Visit: Payer: Medicare Other | Admitting: Cardiology

## 2020-10-04 VITALS — BP 150/74 | HR 75 | Ht 71.0 in | Wt 226.0 lb

## 2020-10-04 DIAGNOSIS — I2510 Atherosclerotic heart disease of native coronary artery without angina pectoris: Secondary | ICD-10-CM

## 2020-10-04 DIAGNOSIS — I10 Essential (primary) hypertension: Secondary | ICD-10-CM | POA: Diagnosis not present

## 2020-10-04 DIAGNOSIS — I5042 Chronic combined systolic (congestive) and diastolic (congestive) heart failure: Secondary | ICD-10-CM

## 2020-10-04 DIAGNOSIS — I4821 Permanent atrial fibrillation: Secondary | ICD-10-CM | POA: Diagnosis not present

## 2020-10-04 MED ORDER — BENAZEPRIL HCL 40 MG PO TABS: 40.0000 mg | ORAL_TABLET | Freq: Every day | ORAL | 3 refills | Status: DC

## 2020-10-04 NOTE — Patient Instructions (Signed)
Medication Instructions:   INCREASE BENAZEPRIL TO 40 MG ONCE DAILY= 2 OF THE 20 MG TABLETS ONCE DAILY  *If you need a refill on your cardiac medications before your next appointment, please call your pharmacy*   Lab Work:  Your physician recommends that you return for lab work in:ONE WEEK  If you have labs (blood work) drawn today and your tests are completely normal, you will receive your results only by: Marland Kitchen MyChart Message (if you have MyChart) OR . A paper copy in the mail If you have any lab test that is abnormal or we need to change your treatment, we will call you to review the results.  Follow-Up: At [facility], you and your health needs are our priority.  As part of our continuing mission to provide you with exceptional heart care, we have created designated Provider Care Teams.  These Care Teams include your primary Cardiologist (physician) and Advanced Practice Providers (APPs -  Physician Assistants and Nurse Practitioners) who all work together to provide you with the care you need, when you need it.  We recommend signing up for the patient portal called "MyChart".  Sign up information is provided on this After Visit Summary.  MyChart is used to connect with patients for Virtual Visits (Telemedicine).  Patients are able to view lab/test results, encounter notes, upcoming appointments, etc.  Non-urgent messages can be sent to your provider as well.   To learn more about what you can do with MyChart, go to [email or web address].    Your next appointment:   6 month(s)  The format for your next appointment:   In Person  Provider:   Kirk Ruths, MD

## 2020-10-11 DIAGNOSIS — I5042 Chronic combined systolic (congestive) and diastolic (congestive) heart failure: Secondary | ICD-10-CM | POA: Diagnosis not present

## 2020-10-12 LAB — BASIC METABOLIC PANEL
BUN/Creatinine Ratio: 13 (ref 10–24)
BUN: 17 mg/dL (ref 8–27)
CO2: 25 mmol/L (ref 20–29)
Calcium: 9.3 mg/dL (ref 8.6–10.2)
Chloride: 105 mmol/L (ref 96–106)
Creatinine, Ser: 1.35 mg/dL — ABNORMAL HIGH (ref 0.76–1.27)
GFR calc Af Amer: 55 mL/min/{1.73_m2} — ABNORMAL LOW (ref >59)
GFR calc non Af Amer: 47 mL/min/{1.73_m2} — ABNORMAL LOW (ref >59)
Glucose: 121 mg/dL — ABNORMAL HIGH (ref 65–99)
Potassium: 3.8 mmol/L (ref 3.5–5.2)
Sodium: 145 mmol/L — ABNORMAL HIGH (ref 134–144)

## 2020-10-13 ENCOUNTER — Telehealth: Payer: Self-pay | Admitting: Sports Medicine

## 2020-10-13 NOTE — Telephone Encounter (Signed)
Please work on [employer] for Kaine, he did have it in the past, he does have historical x-ray confirmed osteoarthritis just not in our system, and I would like to work on bilateral Orthovisc approval again. (Or any other viscosupplementation that is preferred for his insurance company.)

## 2020-10-16 MED ORDER — AMLODIPINE BESYLATE 5 MG PO TABS: 5.0000 mg | ORAL_TABLET | Freq: Every day | ORAL | 3 refills | Status: DC

## 2020-10-18 NOTE — Telephone Encounter (Signed)
Submitted Orthovisc for approval waiting on BID and to see if PA is needed - CF

## 2020-10-24 ENCOUNTER — Ambulatory Visit: Payer: Medicare Other | Admitting: Pharmacist

## 2020-10-24 DIAGNOSIS — I10 Essential (primary) hypertension: Secondary | ICD-10-CM

## 2020-10-24 DIAGNOSIS — E7800 Pure hypercholesterolemia, unspecified: Secondary | ICD-10-CM

## 2020-10-24 DIAGNOSIS — E1159 Type 2 diabetes mellitus with other circulatory complications: Secondary | ICD-10-CM

## 2020-10-24 NOTE — Patient Instructions (Addendum)
Visit Information  Goals Addressed            This Visit's Progress   . Chronic Care Management Pharmacy Care Plan       CARE PLAN ENTRY (see longitudinal plan of care for additional care plan information)  Current Barriers:  . Chronic Disease Management support, education, and care coordination needs related to Hypertension, Hyperlipidemia/Coronary Atherosclerosis, Diabetes, Afib, Heart Failure, GERD, BPH, Pain   Hypertension BP Readings from Last 3 Encounters:  10/04/20 (!) 150/74  07/31/20 (!) 143/73  06/28/20 137/75   . Pharmacist Clinical Goal(s): o Over the next 90 days, patient will work with PharmD and providers to achieve BP goal <140/90 . Current regimen:  . Benazepril 40mg  daily AM . Metoprolol succinate 25mg  daily AM . Amlodipine 5mg  daily . Interventions: o Requested patient to check BP 2-3 times per week and record o Discussed possibility of getting amlodipine/benazepril combo pill . Patient self care activities - Over the next 90 days, patient will: o Check BP 2-3 times per week, document, and provide at future appointments o Ensure daily salt intake < 2300 mg/Zach Tietje  Hyperlipidemia Lab Results  Component Value Date/Time   LDLCALC 57 02/24/2019 10:29 AM   LDLDIRECT 46.0 02/07/2020 08:15 AM   . Pharmacist Clinical Goal(s): o Over the next 90 days, patient will work with PharmD and providers to maintain LDL goal < 70  . Current regimen:  . Atorvastatin 80mg  1/2 tab (40mg ) daily  . Fish Oil 1000mg  daily . Coenzyme Q10 100mg  daily . Interventions: o Requested that patient inform Dr. Stanford Breed that he is only taking 1/2 tab of atorvastatin to help align fill dates appropriately when prescribing . Patient self care activities - Over the next 90 days, patient will: o Get atorvastatin script updated to reflect how he takes it  o Maintain cholesterol medication regimen.   Diabetes Lab Results  Component Value Date/Time   HGBA1C 7.2 (H) 07/31/2020 11:54 AM    HGBA1C 6.8 (H) 02/07/2020 08:15 AM   . Pharmacist Clinical Goal(s): o Over the next 90 days, patient will work with PharmD and providers to maintain A1c goal <7% . Current regimen:  o Diet and exercise management   . Interventions: o Discussed diet and exercise . Patient self care activities - Over the next 90 days, patient will: o Maintain a1c <7%  Pain . Pharmacist Clinical Goal(s) o Over the next 90 days, patient will work with PharmD and providers to reduce symptoms of pain . Current regimen:  . Tramadol 50mg  three times daily as needed . Tylenol PM 25-500mg  #2 HS . Interventions: o Coordinated appt with Dr. Isabelle Course office for 08/21/2020 . Patient self care activities - Over the next 90 days, patient will: o Keep appt with Dr. Francesco Runner  Medication management . Pharmacist Clinical Goal(s): o Over the next 90 days, patient will work with PharmD and providers to achieve optimal medication adherence . Current pharmacy: [facility] . Interventions o Comprehensive medication review performed. o Continue current medication management strategy . Patient self care activities - Over the next 90 days, patient will: o Focus on medication adherence by filling and taking medications appropriately  o Take medications as prescribed o Report any questions or concerns to PharmD and/or provider(s)  Please see past updates related to this goal by clicking on the "Past Updates" button in the selected goal         The patient verbalized understanding of instructions, educational materials, and care plan provided today and agreed  to receive a mailed copy of patient instructions, educational materials, and care plan.   Telephone follow up appointment with pharmacy team member scheduled for: 01/30/2021  De Blanch, PharmD Clinical Pharmacist [facility] at [facility] [phone]

## 2020-10-24 NOTE — Chronic Care Management (AMB) (Addendum)
Chronic Care Management Pharmacy  Name: [name]  MRN: [id number] DOB: June 28, 1931  Chief Complaint/ HPI  Florence Canner,  86 y.o. , male presents for their Follow-Up CCM visit with the clinical pharmacist via telephone due to COVID-19 Pandemic.  PCP : Colon Branch, MD  Their chronic conditions include: Hypertension, Hyperlipidemia/Coronary Atherosclerosis, Diabetes, Afib, Heart Failure, GERD, BPH, Pain  Office Visits: 07/31/20/21: Visit w/ Dr. Larose Kells - No med changes noted.   Consult Visit: 10/15/20: Patient message about elevated blood pressure, reported to Dr. Stanford Breed. Dr. Stanford Breed added amlodipine 77m daily.  10/04/20: Cardio visit w/ Dr. CStanford Breed- Increase benazepril to 416mdaily   10/03/20: Foot and Ankle Specialist visit w/ Dr. ArSharla Kidney prescription for custom molded AFOs for right and left food. Gauntlet braces when walking   Medications: Outpatient Encounter Medications as of 10/24/2020  Medication Sig Note   amLODipine (NORVASC) 5 MG tablet Take 1 tablet (5 mg total) by mouth daily.    atorvastatin (LIPITOR) 80 MG tablet Take 0.5 tablets (40 mg total) by mouth daily.    benazepril (LOTENSIN) 40 MG tablet Take 1 tablet (40 mg total) by mouth daily.    Coenzyme Q10 (COQ10) 100 MG CAPS Take 1 capsule by mouth daily.     diphenhydramine-acetaminophen (TYLENOL PM) 25-500 MG TABS tablet Take 2 tablets by mouth at bedtime.    furosemide (LASIX) 40 MG tablet Take 1 tablet (40 mg total) by mouth daily.    Melatonin 10 MG TABS Take 10 mg by mouth at bedtime.     metoprolol succinate (TOPROL-XL) 25 MG 24 hr tablet Take 1 tablet (25 mg total) by mouth daily.    Multiple Vitamin (MULTIVITAMIN WITH MINERALS) TABS tablet Take 1 tablet by mouth daily.    pantoprazole (PROTONIX) 40 MG tablet Take 1 tablet (40 mg total) by mouth daily.    Rivaroxaban (XARELTO) 15 MG TABS tablet Take 1 tablet (15 mg total) by mouth daily with supper.    tamsulosin (FLOMAX) 0.4 MG CAPS capsule  Take 1 capsule (0.4 mg total) by mouth daily.    traMADol (ULTRAM) 50 MG tablet Take 1 tablet (50 mg total) by mouth 3 (three) times daily as needed. 07/24/2020: Uses as needed mornings that he plays golf   No facility-administered encounter medications on file as of 10/24/2020.   SDOH Screenings   Alcohol Screen:    Last Alcohol Screening Score (AUDIT): Not on file  Depression (PHQ2-9): Low Risk    PHQ-2 Score: 0  Financial Resource Strain: Low Risk    Difficulty of Paying Living Expenses: Not hard at all  Food Insecurity: No Food Insecurity   Worried About RuCharity fundraisern the Last Year: Never true   Ran Out of Food in the Last Year: Never true  Housing: Low Risk    Last Housing Risk Score: 0  Physical Activity:    Days of Exercise per Week: Not on file   Minutes of Exercise per Session: Not on file  Social Connections:    Frequency of Communication with Friends and Family: Not on file   Frequency of Social Gatherings with Friends and Family: Not on file   Attends Religious Services: Not on file   Active Member of Clubs or Organizations: Not on file   Attends ClArchivisteetings: Not on file   Marital Status: Not on file  Stress:    Feeling of Stress : Not on file  Tobacco Use: Medium Risk  Smoking Tobacco Use: Former Smoker   Smokeless Tobacco Use: Never Used  Transportation Needs: No Data processing manager (Medical): No   Lack of Transportation (Non-Medical): No     Current Diagnosis/Assessment:  Goals Addressed            This Visit's Progress    Chronic Care Management Pharmacy Care Plan       CARE PLAN ENTRY (see longitudinal plan of care for additional care plan information)  Current Barriers:   Chronic Disease Management support, education, and care coordination needs related to Hypertension, Hyperlipidemia/Coronary Atherosclerosis, Diabetes, Afib, Heart Failure, GERD, BPH, Pain    Hypertension BP Readings from Last 3 Encounters:  10/04/20 (!) 150/74  07/31/20 (!) 143/73  06/28/20 137/75    Pharmacist Clinical Goal(s): o Over the next 90 days, patient will work with PharmD and providers to achieve BP goal <140/90  Current regimen:   Benazepril 66m daily AM  Metoprolol succinate 290mdaily AM  Amlodipine 69m29maily  Interventions: o Requested patient to check BP 2-3 times per week and record o Discussed possibility of getting amlodipine/benazepril combo pill  Patient self care activities - Over the next 90 days, patient will: o Check BP 2-3 times per week, document, and provide at future appointments o Ensure daily salt intake < 2300 mg/Aldred Mase  Hyperlipidemia Lab Results  Component Value Date/Time   LDLCALC 57 02/24/2019 10:29 AM   LDLDIRECT 46.0 02/07/2020 08:15 AM    Pharmacist Clinical Goal(s): o Over the next 90 days, patient will work with PharmD and providers to maintain LDL goal < 70   Current regimen:   Atorvastatin 79m60m2 tab (40mg64mily   Fish Oil 1000mg 79my  Coenzyme Q10 100mg d26m  Interventions: o Requested that patient inform Dr. CrenshaStanford Breede is only taking 1/2 tab of atorvastatin to help align fill dates appropriately when prescribing  Patient self care activities - Over the next 90 days, patient will: o Get atorvastatin script updated to reflect how he takes it  o Maintain cholesterol medication regimen.   Diabetes Lab Results  Component Value Date/Time   HGBA1C 7.2 (H) 07/31/2020 11:54 AM   HGBA1C 6.8 (H) 02/07/2020 08:15 AM    Pharmacist Clinical Goal(s): o Over the next 90 days, patient will work with PharmD and providers to maintain A1c goal <7%  Current regimen:  o Diet and exercise management    Interventions: o Discussed diet and exercise  Patient self care activities - Over the next 90 days, patient will: o Maintain a1c <7%  Pain  Pharmacist Clinical Goal(s) o Over the next 90 days, patient  will work with PharmD and providers to reduce symptoms of pain  Current regimen:   Tramadol 50mg th60mtimes daily as needed  Tylenol PM 25-500mg #2 23mInterventions: o Coordinated appt with Dr. O'Toole'sIsabelle Courseor 08/21/2020  Patient self care activities - Over the next 90 days, patient will: o Keep appt with Dr. O'Toole  Francesco Runnerion management  Pharmacist Clinical Goal(s): o Over the next 90 days, patient will work with PharmD and providers to achieve optimal medication adherence  Current pharmacy: [facility]  Interventions o Comprehensive medication review performed. o Continue current medication management strategy  Patient self care activities - Over the next 90 days, patient will: o Focus on medication adherence by filling and taking medications appropriately  o Take medications as prescribed o Report any questions or concerns to PharmD and/or provider(s)  Please see past updates related  to this goal by clicking on the "Past Updates" button in the selected goal       Social Hx:  Born and raised in [place].    Hypertension   BP goal is:  <140/90  Office blood pressures are  BP Readings from Last 3 Encounters:  10/04/20 (!) 150/74  07/31/20 (!) 143/73  06/28/20 137/75   CMP Latest Ref Rng & Units 10/11/2020 07/31/2020 04/22/2020  Glucose 65 - 99 mg/dL 121(H) 139(H) 193(H)  BUN 8 - 27 mg/dL 17 19 22   Creatinine 0.76 - 1.27 mg/dL 1.35(H) 1.41 1.46(H)  Sodium 134 - 144 mmol/L 145(H) 140 140  Potassium 3.5 - 5.2 mmol/L 3.8 4.3 4.0  Chloride 96 - 106 mmol/L 105 103 104  CO2 20 - 29 mmol/L 25 27 24   Calcium 8.6 - 10.2 mg/dL 9.3 9.5 9.2  Total Protein 6.0 - 8.3 g/dL - - -  Total Bilirubin 0.2 - 1.2 mg/dL - - -  Alkaline Phos 39 - 117 U/L - - -  AST 0 - 37 U/L - - -  ALT 0 - 53 U/L - - -   Kidney Function Lab Results  Component Value Date/Time   CREATININE 1.35 (H) 10/11/2020 02:15 PM   CREATININE 1.41 07/31/2020 11:54 AM   CREATININE 1.74 (H) 09/16/2018 02:59 PM    CREATININE 1.49 (H) 01/30/2017 10:07 AM   GFR 47.58 (L) 07/31/2020 11:54 AM   GFRNONAA 47 (L) 10/11/2020 02:15 PM   GFRNONAA 52 (L) 10/06/2013 05:03 PM   GFRAA 55 (L) 10/11/2020 02:15 PM   GFRAA 60 10/06/2013 05:03 PM   K 3.8 10/11/2020 02:15 PM   K 4.3 07/31/2020 11:54 AM   Patient checks BP at home infrequently Patient home BP readings are ranging: Pt reports AM BP 150-160s before meds and 130-140s after meds.  Patient has failed these meds in the past: amlodipine (low bp) Patient is currently uncontrolled on the following medications:   Benazepril 71m daily AM  Metoprolol succinate 259mdaily AM  Amlodipine 64m47maily (started on 10/17/20)  He would like to switch to 16m78mb of benazepril, but still has plenty of 10mg52ms. Denies chest pain, headache, dizziness He states he can tell when his BP runs high. He gets a slight headache.  We discussed BP goal, BP technique   Update 10/24/20 Reported he had a slight headache before starting amlodipine, but none now.  Denies chest pain or dizziness.  Discussed possibility of getting amlodipine/benazepril combo pill to reduce pill burden. Pt states he just got amlodipine and would consider this when he runs out.  BP Readings since 10/17/20. BP trending down since starting amlodipine. 154 66 153 76 150 79 137 77 132 67  132 69 Avg 143 72.3   Plan -Check BP 2-3 times per week and record -Continue current medications     Hyperlipidemia/Coronary Atherosclerosis   LDL goal <70  Lipid Panel     Component Value Date/Time   CHOL 107 02/07/2020 0815   TRIG 253.0 (H) 02/07/2020 0815   HDL 25.50 (L) 02/07/2020 0815   LDLCALC 57 02/24/2019 1029   LDLDIRECT 46.0 02/07/2020 0815    Hepatic Function Latest Ref Rng & Units 02/07/2020 02/24/2019 05/01/2018  Total Protein 6.0 - 8.3 g/dL 6.8 - -  Albumin 3.5 - 5.2 g/dL 4.1 - -  AST 0 - 37 U/L 19 23 23   ALT 0 - 53 U/L 18 19 -  Alk Phosphatase 39 - 117 U/L 54 - -  Total  Bilirubin  0.2 - 1.2 mg/dL 0.7 - -  Bilirubin, Direct 0.0 - 0.3 mg/dL - - -     The ASCVD Risk score Mikey Bussing DC Jr., et al., 2013) failed to calculate for the following reasons:   The 2013 ASCVD risk score is only valid for ages 64 to 53   Patient has failed these meds in past: None noted  Patient is currently controlled, except for TG and HDL on the following medications:   Atorvastatin 10m daily (patient only taking 472mand has done this for a long time)  Fish Oil 100029maily  Coenzyme Q10 100m80mily  States he has an appt with Dr. CrenStanford Breedtates he has enough atorvastatin to last a year.  Atorvastatin Fill Date 03/01/20 - 90 DS   Diet: Denies eating a lot fried foods.  Admits he does like to eat sweets. "Anything sweet". Normally has a cookie in the morning. B - Cereal (bran flakes, raisen bran, oatmeal) L - peanut butter crackers and banana sandwich D - 3 veggies and a meat Snacks - "not normally" if he does he eats peanut butter crackers  Exercise: Plays golf 3 days a week, and gym 3 days a week.  [facility] [employer].  We discussed:  diet and exercise extensively   Update 10/24/20 Still playing golf and going to the gym  Plan -Continue current medications  Diabetes   A1c goal <7%  Recent Relevant Labs: Lab Results  Component Value Date/Time   HGBA1C 7.2 (H) 07/31/2020 11:54 AM   HGBA1C 6.8 (H) 02/07/2020 08:15 AM   GFR 47.58 (L) 07/31/2020 11:54 AM   GFR 40.58 (L) 02/07/2020 08:15 AM    Last diabetic Eye exam:  Lab Results  Component Value Date/Time   HMDIABEYEEXA No Retinopathy 05/22/2020 12:00 AM    Last diabetic Foot exam:  Lab Results  Component Value Date/Time   HMDIABFOOTEX Normal/[employer] House Calls 01/17/2020 12:00 AM    Patient has failed these meds in past: None noted  Patient is currently controlled on the following medications:  None  We discussed: diet and exercise extensively   Update 10/24/20 Still playing golf and going to the  gym  Plan -Continue control with diet and exercise   Heart Failure   Type: Combined Systolic and Diastolic  Last ejection fraction: 11/28/2016 LVEF 40-45% NYHA Class: I (no actitivty limitation) AHA HF Stage: C (Heart disease and symptoms present)  Patient has failed these meds in past: None noted  Patient is currently controlled on the following medications:   Furosemide 40mg50mly  Metoprolol succinate 25mg 67my  Reports he has swelling in both ankles, but it it is at baseline level.  Denies SOB. Reports he weighs daily. Weighing around 220lbs.  States he stays within 1-1.5lbs each Ani Deoliveira.  We discussed weighing daily; if you gain more than 3 pounds in one Verlene Glantz or 5 pounds in one week call your doctor   Update 10/24/20 States his weight is around 224-225, not weighing daily  Plan -Continue current medications    Pain   Patient has failed these meds in past: None noted  Patient is currently stable on the following medications:  Tramadol 50mg t11m times daily as needed  Tylenol PM 25-500mg #264m States he still hurts when he plays golf, but he pushes through the pain. States he went to Dr. O'Toole'Isabelle Courseto inquire about getting scheduled for visit, but has not been scheduled.  States he turned his mattress  and this has helped some. He would still like to get scheduled with Dr. Francesco Runner.   Called Dr. Isabelle Course office during visit to coordinate scheduling. Coordinated to have patient scheduled with Dr. Francesco Runner on Monday, Sept. 13 at 9:45am  Update 10/24/20 Doesn't recall any med changes made at visit with Dr. Francesco Runner  Plan -Continue current medications  -Keep appt with Dr. Francesco Runner on 08/21/20  Medication Management   [facility] medications Uses pill box? No - too much of a problem (has a drawer for prescriptions and a drawer for vitamins) Pt endorses 100% compliance  We discussed: Option of [employer], but patient likes  his current system of organizing medication  Plan -Continue current medication management strategy   Follow up:  -1 month HTN assessment  -3 month phone visit   Miscellaneous Meds  Melatonin 15m  Multivitamin Iron 624mdaily Turmeric 150034maily  Meds to D/C from list  Vitamin D 1000 units daily (no longer taking) Diclofenac 1% gel (no longer taking) Vitamin B12 1000m10maily (no longer taking)  KaneDe BlancharmD Clinical Pharmacist [facility]-[phone]

## 2020-10-25 DIAGNOSIS — J342 Deviated nasal septum: Secondary | ICD-10-CM | POA: Diagnosis not present

## 2020-10-25 DIAGNOSIS — H903 Sensorineural hearing loss, bilateral: Secondary | ICD-10-CM | POA: Diagnosis not present

## 2020-10-25 DIAGNOSIS — J310 Chronic rhinitis: Secondary | ICD-10-CM | POA: Diagnosis not present

## 2020-10-25 DIAGNOSIS — H6123 Impacted cerumen, bilateral: Secondary | ICD-10-CM | POA: Diagnosis not present

## 2020-10-25 DIAGNOSIS — L299 Pruritus, unspecified: Secondary | ICD-10-CM | POA: Diagnosis not present

## 2020-11-01 MED ORDER — HYDRALAZINE HCL 25 MG PO TABS
25.0000 mg | ORAL_TABLET | Freq: Three times a day (TID) | ORAL | 3 refills | Status: DC
Start: 2020-11-01 — End: 2021-07-18

## 2020-11-01 NOTE — Telephone Encounter (Signed)
DC amlodipine; hydralazine 25 mg po TID  Kirk Ruths

## 2020-11-08 ENCOUNTER — Ambulatory Visit: Payer: Medicare Other | Admitting: Cardiology

## 2020-11-22 ENCOUNTER — Telehealth: Payer: Self-pay | Admitting: Pharmacist

## 2020-11-22 NOTE — Progress Notes (Signed)
Chronic Care Management Pharmacy Assistant   Name: [name]  MRN: [id number] DOB: Apr 20, 1931  Reason for Encounter: HTN Disease State  Patient Questions:  1.  Have you seen any other providers since your last visit? No  2.  Any changes in your medicines or health? No   PCP : Colon Branch, MD   Their chronic conditions include: Hypertension, Hyperlipidemia/Coronary Atherosclerosis, Diabetes, Afib, Heart Failure, GERD, BPH, Pain  TE:  10-30-2020 (Cardio) Patient contacted Dr. Stanford Breed to inform him of left leg swelling with some edema. Onset was after Amlodipine was prescribed and taken. Patient was prescribed Hydralazine 25 mg three times a day. Amlodipine was discontinued.   Allergies:  No Known Allergies  Medications: Outpatient Encounter Medications as of 11/22/2020  Medication Sig Note   amLODipine (NORVASC) 5 MG tablet Take 1 tablet (5 mg total) by mouth daily.    atorvastatin (LIPITOR) 80 MG tablet Take 0.5 tablets (40 mg total) by mouth daily.    benazepril (LOTENSIN) 40 MG tablet Take 1 tablet (40 mg total) by mouth daily.    Coenzyme Q10 (COQ10) 100 MG CAPS Take 1 capsule by mouth daily.     diphenhydramine-acetaminophen (TYLENOL PM) 25-500 MG TABS tablet Take 2 tablets by mouth at bedtime.    furosemide (LASIX) 40 MG tablet Take 1 tablet (40 mg total) by mouth daily.    hydrALAZINE (APRESOLINE) 25 MG tablet Take 1 tablet (25 mg total) by mouth 3 (three) times daily.    Melatonin 10 MG TABS Take 10 mg by mouth at bedtime.     metoprolol succinate (TOPROL-XL) 25 MG 24 hr tablet Take 1 tablet (25 mg total) by mouth daily.    Multiple Vitamin (MULTIVITAMIN WITH MINERALS) TABS tablet Take 1 tablet by mouth daily.    pantoprazole (PROTONIX) 40 MG tablet Take 1 tablet (40 mg total) by mouth daily.    Rivaroxaban (XARELTO) 15 MG TABS tablet Take 1 tablet (15 mg total) by mouth daily with supper.    tamsulosin (FLOMAX) 0.4 MG CAPS capsule Take 1 capsule (0.4 mg  total) by mouth daily.    traMADol (ULTRAM) 50 MG tablet Take 1 tablet (50 mg total) by mouth 3 (three) times daily as needed. 07/24/2020: Uses as needed mornings that he plays golf   No facility-administered encounter medications on file as of 11/22/2020.    Current Diagnosis: Patient Active Problem List   Diagnosis Date Noted   Left hip pain 10/18/2019   Lumbar facet joint syndrome 04/16/2019   CHF (congestive heart failure) ([place]) 12/17/2016   PCP NOTES >>>>> 10/11/2015   Carpal tunnel syndrome 02/15/2014   Anemia 08/14/2012   Annual physical exam 01/08/2012   Atrial fibrillation ([place]) 03/29/2011   Venous (peripheral) insufficiency 09/04/2009   OBESITY 07/04/2009   DJD (degenerative joint disease) 07/04/2009   SLEEP DISORDER 07/04/2009   Dyspepsia 02/28/2009   HEMATOCHEZIA 02/07/2009   DM II (diabetes mellitus, type II), controlled ([place]) 04/25/2008   Hyperlipidemia 06/25/2007   Essential hypertension 06/25/2007   Coronary atherosclerosis 06/25/2007    Goals Addressed   None    Reviewed chart prior to disease state call. Spoke with patient regarding BP  Recent Office Vitals: BP Readings from Last 3 Encounters:  10/04/20 (!) 150/74  07/31/20 (!) 143/73  06/28/20 137/75   Pulse Readings from Last 3 Encounters:  10/04/20 75  07/31/20 (!) 58  06/28/20 61    Wt Readings from Last 3 Encounters:  10/04/20 226 lb (102.5 kg)  07/31/20 225 lb 6 oz (102.2 kg)  06/28/20 221 lb (100.2 kg)     Kidney Function Lab Results  Component Value Date/Time   CREATININE 1.35 (H) 10/11/2020 02:15 PM   CREATININE 1.41 07/31/2020 11:54 AM   CREATININE 1.74 (H) 09/16/2018 02:59 PM   CREATININE 1.49 (H) 01/30/2017 10:07 AM   GFR 47.58 (L) 07/31/2020 11:54 AM   GFRNONAA 47 (L) 10/11/2020 02:15 PM   GFRNONAA 52 (L) 10/06/2013 05:03 PM   GFRAA 55 (L) 10/11/2020 02:15 PM   GFRAA 60 10/06/2013 05:03 PM    BMP Latest Ref Rng & Units 10/11/2020 07/31/2020 04/22/2020   Glucose 65 - 99 mg/dL 121(H) 139(H) 193(H)  BUN 8 - 27 mg/dL 17 19 22   Creatinine 0.76 - 1.27 mg/dL 1.35(H) 1.41 1.46(H)  BUN/Creat Ratio 10 - 24 13 - -  Sodium 134 - 144 mmol/L 145(H) 140 140  Potassium 3.5 - 5.2 mmol/L 3.8 4.3 4.0  Chloride 96 - 106 mmol/L 105 103 104  CO2 20 - 29 mmol/L 25 27 24   Calcium 8.6 - 10.2 mg/dL 9.3 9.5 9.2     Current antihypertensive regimen:   Benazepril 40 mg daily AM  Metoprolol succinate 25 mg daily AM  Hydralazine 25 mg three times a day    Third unsuccessful telephone outreach was attempted today. The patient was referred to the pharmacist for assistance with care management and care coordination.    Follow-Up:  Pharmacist Review   Fanny Skates, [place] Pharmacist Assistant [phone]

## 2020-11-26 ENCOUNTER — Other Ambulatory Visit: Payer: Self-pay | Admitting: Internal Medicine

## 2020-11-28 NOTE — Telephone Encounter (Signed)
Called and spoke with patient.  His OrthoVisc is covered after he meets his $3600 deductible.  His co-pay would be $2400.00 and the medication requires PA.  Patient does not wish to move forward with OrthoVisc due to the cost.  Patient would like to know what his options are besides OrthoVisc.  Please Advise!!

## 2020-11-29 ENCOUNTER — Other Ambulatory Visit: Payer: Self-pay | Admitting: Internal Medicine

## 2020-11-29 NOTE — Telephone Encounter (Signed)
We can try some more steroid injections in the meantime.

## 2020-12-06 ENCOUNTER — Ambulatory Visit (INDEPENDENT_AMBULATORY_CARE_PROVIDER_SITE_OTHER): Payer: Medicare Other | Admitting: Internal Medicine

## 2020-12-06 ENCOUNTER — Other Ambulatory Visit: Payer: Self-pay

## 2020-12-06 ENCOUNTER — Encounter: Payer: Self-pay | Admitting: Internal Medicine

## 2020-12-06 VITALS — BP 148/76 | HR 61 | Temp 97.9°F | Resp 18 | Ht 71.0 in | Wt 227.4 lb

## 2020-12-06 DIAGNOSIS — I10 Essential (primary) hypertension: Secondary | ICD-10-CM

## 2020-12-06 DIAGNOSIS — E7800 Pure hypercholesterolemia, unspecified: Secondary | ICD-10-CM | POA: Diagnosis not present

## 2020-12-06 DIAGNOSIS — Z0001 Encounter for general adult medical examination with abnormal findings: Secondary | ICD-10-CM | POA: Diagnosis not present

## 2020-12-06 DIAGNOSIS — E1159 Type 2 diabetes mellitus with other circulatory complications: Secondary | ICD-10-CM

## 2020-12-06 DIAGNOSIS — R399 Unspecified symptoms and signs involving the genitourinary system: Secondary | ICD-10-CM | POA: Diagnosis not present

## 2020-12-06 DIAGNOSIS — D485 Neoplasm of uncertain behavior of skin: Secondary | ICD-10-CM | POA: Diagnosis not present

## 2020-12-06 DIAGNOSIS — Z0000 Encounter for general adult medical examination without abnormal findings: Secondary | ICD-10-CM | POA: Diagnosis not present

## 2020-12-06 DIAGNOSIS — L570 Actinic keratosis: Secondary | ICD-10-CM | POA: Diagnosis not present

## 2020-12-06 DIAGNOSIS — I4821 Permanent atrial fibrillation: Secondary | ICD-10-CM

## 2020-12-06 LAB — URINALYSIS, ROUTINE W REFLEX MICROSCOPIC
Bilirubin Urine: NEGATIVE
Hgb urine dipstick: NEGATIVE
Ketones, ur: NEGATIVE
Leukocytes,Ua: NEGATIVE
Nitrite: NEGATIVE
RBC / HPF: NONE SEEN (ref 0–?)
Specific Gravity, Urine: 1.02 (ref 1.000–1.030)
Total Protein, Urine: 30 — AB
Urine Glucose: NEGATIVE
Urobilinogen, UA: 0.2 (ref 0.0–1.0)
pH: 5 (ref 5.0–8.0)

## 2020-12-06 NOTE — Assessment & Plan Note (Signed)
Here for CPX DM: diet control, last A1c 7.2, appropriate for 86 year old gentleman.  Recheck A1c, watch diet. HTN: Cardiology increased benazepril dose due to elevated BP, subsequent  BMP okay. Last month, amlodipine discontinue by cardiology due to edema,, hydralazine started. BP today 148/76, at home in the 140s.  Check a BMP.  No change. Lower extremity edema: This is a chronic issue, worse on the left, see above, recent discontinuation of amlodipine has not helped much with edema.  Rec to continue with present care, leg elevation, low-salt diet. High cholesterol: Well-controlled per last FLP CAD, A. fib, CHF: Seems controlled. LUTS: Continue with inconsistent urinary frequency and occasional urgency.  A DRE and PSA were done few months ago.  We will check a UA urine culture.  Continue Flomax, declined urology eval. DJD: On Ultram prescribed by another doctor.  Despite pain he is a still very active, plays golf 3 times a week, goes to the [facility]. RTC 4 to 5 months.

## 2020-12-06 NOTE — Progress Notes (Signed)
Subjective:    Patient ID: [name], male    DOB: 11-15-1931, 86 y.o.   MRN: [id number]  DOS:  12/06/2020 Type of visit - description: CPX Here for CPX. Still has urinary frequency and occasional urgency. No difficulty urinating, no blood in the urine. Symptoms are not consistent, he has good days and bad days.  Does not have consistent nocturia Denies chest pain no difficulty breathing No nausea or vomiting   Wt Readings from Last 3 Encounters:  12/06/20 227 lb 6 oz (103.1 kg)  10/04/20 226 lb (102.5 kg)  07/31/20 225 lb 6 oz (102.2 kg)     Review of Systems  Other than above, a 14 point review of systems is negative      Past Medical History:  Diagnosis Date  . Anemia   . Antral gastritis 2013   EGD   . Atrial fibrillation (HCC)   . CAD (coronary artery disease)    s/p CABG 1993 (L-LAD, S-RI, S-D1);   echo 1/10: EF 55%;    myoview 12/09: inf MI, no ischemia  . Candida esophagitis (HCC) 2013   EGD   . Cataracts, bilateral   . Family history of malignant neoplasm of gastrointestinal tract   . Folliculitis   . Hiatal hernia   . HTN (hypertension)   . Hyperlipidemia   . Irritable bladder   . Myocardial infarction (HCC)   . Obesity   . Osteoarthritis     Past Surgical History:  Procedure Laterality Date  . CATARACT EXTRACTION Right 07/21/2018  . CHOLECYSTECTOMY     laparoscopic '92  . CORONARY ARTERY BYPASS GRAFT     graft '92: LIMA-LAD, SVG - D2,R1, D1  . ENTEROSCOPY  08/22/2012   Procedure: ENTEROSCOPY;  Surgeon: Charna Elizabeth, MD;  Location: [facility];  Service: Endoscopy;  Laterality: N/A;  . INTRAOCULAR LENS EXCHANGE Right 07/21/2018  . KNEE SURGERY    . MOLE REMOVAL  2001 and 2008  . PILONIDAL CYST EXCISION      Allergies as of 12/06/2020   No Known Allergies     Medication List       Accurate as of December 06, 2020  9:20 PM. If you have any questions, ask your nurse or doctor.        STOP taking these medications   amLODipine 5 MG  tablet Commonly known as: NORVASC Stopped by: [name], MD     TAKE these medications   atorvastatin 80 MG tablet Commonly known as: LIPITOR Take 0.5 tablets (40 mg total) by mouth daily.   benazepril 40 MG tablet Commonly known as: LOTENSIN Take 1 tablet (40 mg total) by mouth daily.   CoQ10 100 MG Caps Take 1 capsule by mouth daily.   diphenhydramine-acetaminophen 25-500 MG Tabs tablet Commonly known as: TYLENOL PM Take 2 tablets by mouth at bedtime.   furosemide 40 MG tablet Commonly known as: LASIX Take 1 tablet (40 mg total) by mouth daily.   hydrALAZINE 25 MG tablet Commonly known as: APRESOLINE Take 1 tablet (25 mg total) by mouth 3 (three) times daily.   Melatonin 10 MG Tabs Take 10 mg by mouth at bedtime.   metoprolol succinate 25 MG 24 hr tablet Commonly known as: TOPROL-XL Take 1 tablet (25 mg total) by mouth daily.   multivitamin with minerals Tabs tablet Take 1 tablet by mouth daily.   pantoprazole 40 MG tablet Commonly known as: PROTONIX Take 1 tablet (40 mg total) by mouth daily.   Rivaroxaban  15 MG Tabs tablet Commonly known as: Xarelto Take 1 tablet (15 mg total) by mouth daily with supper.   tamsulosin 0.4 MG Caps capsule Commonly known as: FLOMAX TAKE 1 CAPSULE(0.4 MG) BY MOUTH DAILY   traMADol 50 MG tablet Commonly known as: ULTRAM Take 1 tablet (50 mg total) by mouth 3 (three) times daily as needed.          Objective:   Physical Exam BP (!) 148/76 (BP Location: Left Arm, Patient Position: Sitting, Cuff Size: Normal)   Pulse 61   Temp 97.9 F (36.6 C) (Oral)   Resp 18   Ht 5\' 11"  (1.803 m)   Wt 227 lb 6 oz (103.1 kg)   SpO2 94%   BMI 31.71 kg/m  General: Well developed, NAD, BMI noted Neck: No  thyromegaly  HEENT:  Normocephalic . Face symmetric, atraumatic Lungs:  CTA B Normal respiratory effort, no intercostal retractions, no accessory muscle use. Heart: Bradycardia Abdomen:  Not distended, soft, non-tender. No  rebound or rigidity.   Lower extremities: Mild edema, more noticeable on the right. Neurologic:  alert & oriented X3.  Speech normal, gait unassisted but limited by DJD. Strength symmetric and appropriate for age.  Psych: Cognition and judgment appear intact.  Cooperative with normal attention span and concentration.  Behavior appropriate. No anxious or depressed appearing.     Assessment    Assessment  DM: diet control, no neuropathy HTN Hyperlipidemia CRI  CV: --CAD, CABG 1993, Myoview 2009 no ischemia. --Atrial fibrillation -- rate control, xarelto -- chronic combined systolic/diastolic congestive heart failure Venous insufficiency, mild LE edema L>>R  LUTS    MSK: DJD, spinal stenosis, R wrist Fx in the 80s GI: --Recurrent melena: Work-up 2013 Dr. 2014: Colonoscopy, EGD, capsule endoscopy.  Felt to be due to NSAIDs --Candida esophagitis, antral gastritis --->  2013 per EGD --HH Sees derm x 2/year  PLAN: Here for CPX DM: diet control, last A1c 7.2, appropriate for 69 year old gentleman.  Recheck A1c, watch diet. HTN: Cardiology increased benazepril dose due to elevated BP, subsequent  BMP okay. Last month, amlodipine discontinue by cardiology due to edema,, hydralazine started. BP today 148/76, at home in the 140s.  Check a BMP.  No change. Lower extremity edema: This is a chronic issue, worse on the left, see above, recent discontinuation of amlodipine has not helped much with edema.  Rec to continue with present care, leg elevation, low-salt diet. High cholesterol: Well-controlled per last FLP CAD, A. fib, CHF: Seems controlled. LUTS: Continue with inconsistent urinary frequency and occasional urgency.  A DRE and PSA were done few months ago.  We will check a UA urine culture.  Continue Flomax, declined urology eval. DJD: On Ultram prescribed by another doctor.  Despite pain he is a still very active, plays golf 3 times a week, [facility] the [facility]. RTC 4 to 5  months.  In addition to CPX, multiple other issues discussed, chart reviewed.  See above.  This visit occurred during the SARS-CoV-2 public health emergency.  Safety protocols were in place, including screening questions prior to the visit, additional usage of staff PPE, and extensive cleaning of exam room while observing appropriate contact time as indicated for disinfecting solutions.

## 2020-12-06 NOTE — Patient Instructions (Addendum)
Continue the same medications and check the  blood pressure weekly. BP GOAL is between 110/65 and  135/85. If it is consistently higher or lower, let me know    GO TO THE LAB : Get the blood work     GO TO THE FRONT DESK, PLEASE SCHEDULE YOUR APPOINTMENTS Come back for   a checkup in 4 to 5 months    Advance Directive  Advance directives are legal documents that let you make choices ahead of time about your health care and medical treatment in case you become unable to communicate for yourself. Advance directives are a way for you to make known your wishes to family, friends, and health care providers. This can let others know about your end-of-life care if you become unable to communicate. Discussing and writing advance directives should happen over time rather than all at once. Advance directives can be changed depending on your situation and what you want, even after you have signed the advance directives. There are different types of advance directives, such as:  Medical power of attorney.  Living will.  Do not resuscitate (DNR) or do not attempt resuscitation (DNAR) order. Health care proxy and medical power of attorney A health care proxy is also called a health care agent. This is a person who is appointed to make medical decisions for you in cases where you are unable to make the decisions yourself. Generally, people choose someone they know well and trust to represent their preferences. Make sure to ask this person for an agreement to act as your proxy. A proxy may have to exercise judgment in the event of a medical decision for which your wishes are not known. A medical power of attorney is a legal document that names your health care proxy. Depending on the laws in your state, after the document is written, it may also need to be:  Signed.  Notarized.  Dated.  Copied.  Witnessed.  Incorporated into your medical record. You may also want to appoint someone to manage  your money in a situation in which you are unable to do so. This is called a durable power of attorney for finances. It is a separate legal document from the durable power of attorney for health care. You may choose the same person or someone different from your health care proxy to act as your agent in money matters. If you do not appoint a proxy, or if there is a concern that the proxy is not acting in your best interests, a court may appoint a guardian to act on your behalf. Living will A living will is a set of instructions that state your wishes about medical care when you cannot express them yourself. Health care providers should keep a copy of your living will in your medical record. You may want to give a copy to family members or friends. To alert caregivers in case of an emergency, you can place a card in your wallet to let them know that you have a living will and where they can find it. A living will is used if you become:  Terminally ill.  Disabled.  Unable to communicate or make decisions. Items to consider in your living will include:  To use or not to use life-support equipment, such as dialysis machines and breathing machines (ventilators).  A DNR or DNAR order. This tells health care providers not to use cardiopulmonary resuscitation (CPR) if breathing or heartbeat stops.  To use or not to use tube  feeding.  To be given or not to be given food and fluids.  Comfort (palliative) care when the goal becomes comfort rather than a cure.  Donation of organs and tissues. A living will does not give instructions for distributing your money and property if you should pass away. DNR or DNAR A DNR or DNAR order is a request not to have CPR in the event that your heart stops beating or you stop breathing. If a DNR or DNAR order has not been made and shared, a health care provider will try to help any patient whose heart has stopped or who has stopped breathing. If you plan to have  surgery, talk with your health care provider about how your DNR or DNAR order will be followed if problems occur. What if I do not have an advance directive? If you do not have an advance directive, some states assign family decision makers to act on your behalf based on how closely you are related to them. Each state has its own laws about advance directives. You may want to check with your health care provider, attorney, or state representative about the laws in your state. Summary  Advance directives are the legal documents that allow you to make choices ahead of time about your health care and medical treatment in case you become unable to tell others about your care.  The process of discussing and writing advance directives should happen over time. You can change the advance directives, even after you have signed them.  Advance directives include DNR or DNAR orders, living wills, and designating an agent as your medical power of attorney. This information is not intended to replace advice given to you by your health care provider. Make sure you discuss any questions you have with your health care provider. Document Revised: 06/24/2019 Document Reviewed: 06/24/2019 Elsevier Patient Education  [place].

## 2020-12-06 NOTE — Assessment & Plan Note (Signed)
--  Td 2013 - PNM 23: 2013; - prevnar 2015  - zostavax 2016; s/p shingrex  -COVID vaccines x3 - had a flu shot per pt --Cscope 02-2012 (-), no further screen per GI letter  --Prostate cancer screening: no further screening. -Advance directives discussed. --Labs:   BMP, A1c, UA, urine culture

## 2020-12-07 LAB — BASIC METABOLIC PANEL
BUN: 28 mg/dL — ABNORMAL HIGH (ref 6–23)
CO2: 26 mEq/L (ref 19–32)
Calcium: 9.1 mg/dL (ref 8.4–10.5)
Chloride: 106 mEq/L (ref 96–112)
Creatinine, Ser: 1.74 mg/dL — ABNORMAL HIGH (ref 0.40–1.50)
GFR: 34.92 mL/min — ABNORMAL LOW (ref >60.00)
Glucose, Bld: 169 mg/dL — ABNORMAL HIGH (ref 70–99)
Potassium: 4.4 mEq/L (ref 3.5–5.1)
Sodium: 140 mEq/L (ref 135–145)

## 2020-12-07 LAB — URINE CULTURE
MICRO NUMBER:: 11366424
Result:: NO GROWTH
SPECIMEN QUALITY:: ADEQUATE

## 2020-12-07 LAB — HEMOGLOBIN A1C: Hgb A1c MFr Bld: 7.2 % — ABNORMAL HIGH (ref 4.6–6.5)

## 2020-12-11 ENCOUNTER — Encounter: Payer: Medicare Other | Admitting: Internal Medicine

## 2020-12-18 DIAGNOSIS — M19071 Primary osteoarthritis, right ankle and foot: Secondary | ICD-10-CM | POA: Diagnosis not present

## 2020-12-18 DIAGNOSIS — M19072 Primary osteoarthritis, left ankle and foot: Secondary | ICD-10-CM | POA: Diagnosis not present

## 2021-01-10 ENCOUNTER — Telehealth: Payer: Self-pay | Admitting: Cardiology

## 2021-01-10 DIAGNOSIS — I4891 Unspecified atrial fibrillation: Secondary | ICD-10-CM

## 2021-01-10 MED ORDER — RIVAROXABAN 15 MG PO TABS: 15.0000 mg | ORAL_TABLET | Freq: Every day | ORAL | 1 refills | Status: DC

## 2021-01-10 NOTE — Telephone Encounter (Signed)
*  STAT* If patient is at the pharmacy, call can be transferred to refill team.   1. Which medications need to be refilled? (please list name of each medication and dose if known) Rivaroxaban (XARELTO) 15 MG TABS tablet  2. Which pharmacy/location (including street and city if local pharmacy) is medication to be sent to? [facility], [address]  3. Do they need a 30 day or 90 day supply? 15 day supply  Patient is completely out of medication and needs enough to last him until mail order is received.

## 2021-01-10 NOTE — Telephone Encounter (Signed)
48m 103.1kg Scr 1.74 ccr 43.6 Lovw/crenshaw 10/04/20

## 2021-01-22 DIAGNOSIS — L570 Actinic keratosis: Secondary | ICD-10-CM | POA: Diagnosis not present

## 2021-01-22 DIAGNOSIS — W908XXS Exposure to other nonionizing radiation, sequela: Secondary | ICD-10-CM | POA: Diagnosis not present

## 2021-01-22 DIAGNOSIS — L578 Other skin changes due to chronic exposure to nonionizing radiation: Secondary | ICD-10-CM | POA: Diagnosis not present

## 2021-01-24 NOTE — Progress Notes (Deleted)
Chronic Care Management Pharmacy Note  01/24/2021 Name:  [name] MRN:  [id number] DOB:  [birth year]  Subjective: [name] is an 87 y.o. year old male who is a primary patient of Paz, Alda Berthold, MD.  The CCM team was consulted for assistance with disease management and care coordination needs.    Engaged with patient by telephone for follow up visit in response to provider referral for pharmacy case management and/or care coordination services.   Consent to Services:  The patient was given the following information about Chronic Care Management services today, agreed to services, and gave verbal consent: 1. CCM service includes personalized support from designated clinical staff supervised by the primary care provider, including individualized plan of care and coordination with other care providers 2. 24/7 contact phone numbers for assistance for urgent and routine care needs. 3. Service will only be billed when office clinical staff spend 20 minutes or more in a month to coordinate care. 4. Only one practitioner may furnish and bill the service in a calendar month. 5.The patient may stop CCM services at any time (effective at the end of the month) by phone call to the office staff. 6. The patient will be responsible for cost sharing (co-pay) of up to 20% of the service fee (after annual deductible is met). Patient agreed to services and consent obtained.  Patient Care Team: Colon Branch, MD as PCP - General (Internal Medicine) Stanford Breed Denice Bors, MD as Consulting Physician (Cardiology) Juanita Craver, MD as Consulting Physician (Gastroenterology) Earlie Server, MD as Consulting Physician (Orthopedic Surgery) Wilford Corner, MD as Consulting Physician (Ophthalmology) Melina Schools, MD as Consulting Physician (Orthopedic Surgery) Leonia Corona, MD as Referring Physician (Ophthalmology) Day, Melvenia Beam, [facility] (Inactive) as Pharmacist (Pharmacist)  Recent office visits: 12/06/20 Larose Kells) - general  follow up no med changes, continue with recent cardiology med changes, lower leg edema not really improved since d/c of amlodipine from cardiology  Recent consult visits: 10/15/20: Patient message about elevated blood pressure, reported to Dr. Stanford Breed. Dr. Stanford Breed added amlodipine 93m daily.  Had swelling -- amlodipine was replaced with hydralazine 280mtid on 10/30/20  10/04/20: Cardio visit w/ Dr. CrStanford Breed Increase benazepril to 4036maily   10/03/20: Foot and Ankle Specialist visit w/ Dr. ArnSharla Kidneyprescription for custom molded AFOs for right and left food. Gauntlet braces when walking  Hospital visits: None in previous 6 months  Objective:  Lab Results  Component Value Date   CREATININE 1.74 (H) 12/06/2020   BUN 28 (H) 12/06/2020   GFR 34.92 (L) 12/06/2020   GFRNONAA 47 (L) 10/11/2020   GFRAA 55 (L) 10/11/2020   NA 140 12/06/2020   K 4.4 12/06/2020   CALCIUM 9.1 12/06/2020   CO2 26 12/06/2020    Lab Results  Component Value Date/Time   HGBA1C 7.2 (H) 12/06/2020 01:35 PM   HGBA1C 7.2 (H) 07/31/2020 11:54 AM   GFR 34.92 (L) 12/06/2020 01:35 PM   GFR 47.58 (L) 07/31/2020 11:54 AM    Last diabetic Eye exam:  Lab Results  Component Value Date/Time   HMDIABEYEEXA No Retinopathy 05/22/2020 12:00 AM    Last diabetic Foot exam:  Lab Results  Component Value Date/Time   HMDIABFOOTEX Normal/[employer] House Calls 01/17/2020 12:00 AM     Lab Results  Component Value Date   CHOL 107 02/07/2020   HDL 25.50 (L) 02/07/2020   LDLCALC 57 02/24/2019   LDLDIRECT 46.0 02/07/2020   TRIG 253.0 (H) 02/07/2020   CHOLHDL  4 02/07/2020    Hepatic Function Latest Ref Rng & Units 02/07/2020 02/24/2019 05/01/2018  Total Protein 6.0 - 8.3 g/dL 6.8 - -  Albumin 3.5 - 5.2 g/dL 4.1 - -  AST 0 - 37 U/L 19 23 23   ALT 0 - 53 U/L 18 19 -  Alk Phosphatase 39 - 117 U/L 54 - -  Total Bilirubin 0.2 - 1.2 mg/dL 0.7 - -  Bilirubin, Direct 0.0 - 0.3 mg/dL - - -    Lab Results  Component Value  Date/Time   TSH 3.95 07/28/2019 09:32 AM   TSH 3.46 05/01/2018 01:42 PM    CBC Latest Ref Rng & Units 04/22/2020 02/07/2020 07/28/2019  WBC 4.0 - 10.5 K/uL 6.6 6.8 5.8  Hemoglobin 13.0 - 17.0 g/dL 13.5 14.1 14.5  Hematocrit 39.0 - 52.0 % 39.1 41.4 42.4  Platelets 150 - 400 K/uL 178 170.0 178.0    Lab Results  Component Value Date/Time   VD25OH 48 02/28/2016 03:32 PM    Clinical ASCVD: Yes  The ASCVD Risk score Mikey Bussing DC Jr., et al., 2013) failed to calculate for the following reasons:   The 2013 ASCVD risk score is only valid for ages 18 to 49    Depression screen PHQ 2/9 12/06/2020 04/10/2020 07/28/2019  Decreased Interest 0 0 0  Down, Depressed, Hopeless 0 0 0  PHQ - 2 Score 0 0 0  Some recent data might be hidden      Social History   Tobacco Use  Smoking Status Former Smoker  . Years: 16.00  . Quit date: 08/14/1964  . Years since quitting: 1.4  Smokeless Tobacco Never Used  Tobacco Comment   quit 1965   BP Readings from Last 3 Encounters:  12/06/20 (!) 148/76  10/04/20 (!) 150/74  07/31/20 (!) 143/73   Pulse Readings from Last 3 Encounters:  12/06/20 61  10/04/20 75  07/31/20 (!) 58   Wt Readings from Last 3 Encounters:  12/06/20 227 lb 6 oz (103.1 kg)  10/04/20 226 lb (102.5 kg)  07/31/20 225 lb 6 oz (102.2 kg)    Assessment/Interventions: Review of patient past medical history, allergies, medications, health status, including review of consultants reports, laboratory and other test data, was performed as part of comprehensive evaluation and provision of chronic care management services.   SDOH:  (Social Determinants of Health) assessments and interventions performed: {yes/no:20286}   CCM Care Plan  No Known Allergies  Medications Reviewed Today    Reviewed by Colon Branch, MD (Physician) on 12/06/20 at 2120  Med List Status: <None>  Medication Order Taking? Sig Documenting Provider Last Dose Status Informant  atorvastatin (LIPITOR) 80 MG tablet  [id number] Yes Take 0.5 tablets (40 mg total) by mouth daily. Colon Branch, MD Taking Active   benazepril (LOTENSIN) 40 MG tablet [id number] Yes Take 1 tablet (40 mg total) by mouth daily. Lelon Perla, MD Taking Active   Coenzyme Q10 (COQ10) 100 MG CAPS [id number] Yes Take 1 capsule by mouth daily.  [provider] Taking Active Self  diphenhydramine-acetaminophen (TYLENOL PM) 25-500 MG TABS tablet [id number] Yes Take 2 tablets by mouth at bedtime. [provider] Taking Active Self  furosemide (LASIX) 40 MG tablet [id number] Yes Take 1 tablet (40 mg total) by mouth daily. Colon Branch, MD Taking Active   hydrALAZINE (APRESOLINE) 25 MG tablet [id number] Yes Take 1 tablet (25 mg total) by mouth 3 (three) times daily. Lelon Perla, MD Taking Active   Melatonin 10  MG TABS [id number] Yes Take 10 mg by mouth at bedtime.  [provider] Taking Active Self  metoprolol succinate (TOPROL-XL) 25 MG 24 hr tablet [id number] Yes Take 1 tablet (25 mg total) by mouth daily. Colon Branch, MD Taking Active   Multiple Vitamin (MULTIVITAMIN WITH MINERALS) TABS tablet [id number] Yes Take 1 tablet by mouth daily. [provider] Taking Active Self  pantoprazole (PROTONIX) 40 MG tablet [id number] Yes Take 1 tablet (40 mg total) by mouth daily. Colon Branch, MD Taking Active   Rivaroxaban (XARELTO) 15 MG TABS tablet [id number] Yes Take 1 tablet (15 mg total) by mouth daily with supper. Colon Branch, MD Taking Active   tamsulosin [facility]) 0.4 MG CAPS capsule [id number] Yes TAKE 1 CAPSULE(0.4 MG) BY MOUTH DAILY Colon Branch, MD Taking Active   traMADol (ULTRAM) 50 MG tablet [id number] Yes Take 1 tablet (50 mg total) by mouth 3 (three) times daily as needed. Silverio Decamp, MD Taking Active            Med Note Quinn Axe Jul 24, 2020  9:09 AM) Uses as needed mornings that he plays golf          Patient Active Problem List   Diagnosis Date Noted  . Left hip pain 10/18/2019   . Lumbar facet joint syndrome 04/16/2019  . CHF (congestive heart failure) ([place]) 12/17/2016  . PCP NOTES >>>>> 10/11/2015  . Carpal tunnel syndrome 02/15/2014  . Anemia 08/14/2012  . Annual physical exam 01/08/2012  . Atrial fibrillation ([place]) 03/29/2011  . Venous (peripheral) insufficiency 09/04/2009  . OBESITY 07/04/2009  . DJD (degenerative joint disease) 07/04/2009  . SLEEP DISORDER 07/04/2009  . Dyspepsia 02/28/2009  . HEMATOCHEZIA 02/07/2009  . DM II (diabetes mellitus, type II), controlled ([place]) 04/25/2008  . Hyperlipidemia 06/25/2007  . Essential hypertension 06/25/2007  . Coronary atherosclerosis 06/25/2007    Immunization History  Administered Date(s) Administered  . Influenza Split 08/27/2012, 09/06/2014, 09/09/2018  . Influenza Whole 09/04/2009  . Influenza, High Dose Seasonal PF 09/07/2019  . Influenza-Unspecified 09/25/2015, 09/09/2016, 09/08/2017, 09/08/2020  . PFIZER(Purple Top)SARS-COV-2 Vaccination 12/30/2019, 01/17/2020  . Pneumococcal Conjugate-13 04/05/2014  . Pneumococcal Polysaccharide-23 01/07/2012  . Td 10/20/2009  . Tdap 03/17/2012, 02/14/2015  . Zoster 01/17/2015  . Zoster Recombinat (Shingrix) 10/27/2018, 12/26/2018    Conditions to be addressed/monitored:  Hypertension, Hyperlipidemia/Coronary Atherosclerosis, Diabetes, Afib, Heart Failure, GERD, BPH, Pain  There are no care plans that you recently modified to display for this patient.    Medication Assistance: {MEDASSISTANCEINFO:25044}  Patient's preferred pharmacy is:  [place], [place] [address] [place] [address] Phone: [phone] Fax: [place], [place] [address] Phone: [phone] Fax: [place], [place], [address] [place], [address] [place] [address] Phone: [phone] Fax: [phone]  Uses pill box? No - he has a drawer for Rx meds and another for vitamins Pt endorses 100% compliance  We discussed: Benefits of medication synchronization, packaging and delivery as well as enhanced pharmacist oversight with [facility]. Patient decided to: Continue current medication management strategy  Care Plan and Follow Up Patient Decision:  {FOLLOWUP:24991}  Plan: {CM FOLLOW UP YZJQ:96438}  Beverly Milch, PharmD Clinical Pharmacist [place] [phone]  Current Barriers:  . {  pharmacybarriers:24917} . ***  Pharmacist Clinical Goal(s):  Marland Kitchen Over the next *** days, patient will {PHARMACYGOALCHOICES:24921} through collaboration with PharmD and provider.  . ***  Interventions: . 1:1 collaboration with Colon Branch, MD regarding development and update of comprehensive plan of care as evidenced by provider attestation and co-signature . Inter-disciplinary care team collaboration (see longitudinal plan of care) . Comprehensive medication review performed; medication list updated in electronic medical record  Hypertension (BP goal {CHL HP [facility] Pharmacist BP ranges:[phone]}) -{CHL Controlled/Uncontrolled:[phone]} -Current treatment:  Benazepril 61m daily AM  Metoprolol succinate 270mdaily AM  Hydralazine 2556mid -Medications previously tried: amlodipine (low BP)  -Current home readings: *** -Current dietary habits: *** -Current exercise habits: *** -{ACTIONS;DENIES/REPORTS:21021675::"Denies"} hypotensive/hypertensive symptoms -Educated on {CCM BP Counseling:25124} -Counseled to monitor BP at home ***, document, and provide log at future appointments -{CCMPHARMDINTERVENTION:25122}  Hyperlipidemia: (LDL goal < 70) -{CHL Controlled/Uncontrolled:[phone]} -Current treatment:  Atorvastatin 1m26mily (patient only taking 40mg51m has done this for a long time)  Fish Oil  1000mg 69my  Coenzyme Q10 100mg d26m -Medications previously tried: ***  -Current dietary patterns: *** -Current exercise habits: *** -Educated on {CCM HLD Counseling:25126} -{CCMPHARMDINTERVENTION:25122}  Diabetes (A1c goal {A1c goals:23924}) -{CHL Controlled/Uncontrolled:[phone]} -Current medications:  None -Medications previously tried: ***  -Current home glucose readings . fasting glucose: *** . post prandial glucose: *** -{ACTIONS;DENIES/REPORTS:21021675::"Denies"} hypoglycemic/hyperglycemic symptoms -Current meal patterns:  . breakfast: Cereal (bran flakes, raisen bran, oatmeal)  . lunch: peanut butter crackers and banana sandwich  . dinner: 3 veggies and a meat . snacks: "not normally" if he does he eats peanut butter crackers . drinks: *** -Current exercise: Plays golf 3 days a week, and gym 3 days a week.  Goes to [employer]. [employer] weights. -Educated on{CCM DM COUNSELING:25123} -Counseled to check feet daily and get yearly eye exams -{CCMPHARMDINTERVENTION:25122}  Heart Failure (Goal: control symptoms and prevent exacerbations) {CHL Controlled/Uncontrolled:[phone]} Type: {type of heart failure:30421350} -NYHA Class: {CHL HP [facility] Pharm NYHA Class:[phone]} -Ejection fraction: *** (Date: ***) -Current treatment:  Furosemide 40mg da60m Metoprolol succinate 25mg dai52mMedications previously tried: *** -Current home BP/HR readings: *** -Current dietary habits: *** -Current exercise routine: *** -Educated on {CCM HF Counseling:25125} -{CCMPHARMDINTERVENTION:25122}  Pain (Goal: ***) -{CHL Controlled/Uncontrolled:[phone]} -Current treatment   Tramadol 50mg thre16mmes daily as needed  Tylenol PM 25-500mg #2 HS40mdications previously tried: ***  -{CCMPHARMDINTERVENTION:25122}   Patient Goals/Self-Care Activities . Over the next *** days, patient will:  - {pharmacypatientgoals:24919}  Follow Up Plan: {CM FOLLOW UP PLAN:22241}OXBD:53299}

## 2021-01-30 ENCOUNTER — Telehealth: Payer: Medicare Other

## 2021-02-05 LAB — HM DIABETES FOOT EXAM

## 2021-02-19 DIAGNOSIS — L578 Other skin changes due to chronic exposure to nonionizing radiation: Secondary | ICD-10-CM | POA: Diagnosis not present

## 2021-02-19 DIAGNOSIS — W908XXS Exposure to other nonionizing radiation, sequela: Secondary | ICD-10-CM | POA: Diagnosis not present

## 2021-02-19 DIAGNOSIS — Z86008 Personal history of in-situ neoplasm of other site: Secondary | ICD-10-CM | POA: Diagnosis not present

## 2021-02-19 DIAGNOSIS — L570 Actinic keratosis: Secondary | ICD-10-CM | POA: Diagnosis not present

## 2021-02-23 ENCOUNTER — Other Ambulatory Visit: Payer: Self-pay | Admitting: Internal Medicine

## 2021-02-28 ENCOUNTER — Encounter: Payer: Self-pay | Admitting: Internal Medicine

## 2021-03-06 ENCOUNTER — Telehealth: Payer: Self-pay | Admitting: Internal Medicine

## 2021-03-06 NOTE — Telephone Encounter (Signed)
error 

## 2021-03-27 ENCOUNTER — Encounter: Payer: Self-pay | Admitting: Emergency Medicine

## 2021-03-27 ENCOUNTER — Emergency Department (INDEPENDENT_AMBULATORY_CARE_PROVIDER_SITE_OTHER): Payer: Medicare Other

## 2021-03-27 ENCOUNTER — Emergency Department
Admission: EM | Admit: 2021-03-27 | Discharge: 2021-03-27 | Disposition: A | Discharge status: Home / Self Care | Payer: Medicare Other | Source: Home / Self Care | Attending: Family Medicine | Admitting: Family Medicine

## 2021-03-27 ENCOUNTER — Other Ambulatory Visit: Payer: Self-pay

## 2021-03-27 ENCOUNTER — Telehealth: Payer: Self-pay | Admitting: Family Medicine

## 2021-03-27 DIAGNOSIS — R221 Localized swelling, mass and lump, neck: Secondary | ICD-10-CM | POA: Diagnosis not present

## 2021-03-27 NOTE — Discharge Instructions (Addendum)
I will call you with test results.

## 2021-03-27 NOTE — ED Provider Notes (Addendum)
[name]    CSN: [id number] Arrival date & time: 03/27/21  1324      History   Chief Complaint Chief Complaint  Patient presents with  . Swollen area on right side of face    HPI [name] is a 87 y.o. male.   HPI   Pleasant 87 year old gentleman.  He has a primary care doctor.  He is under their care for hypertension, diabetes, hyperlipidemia, coronary artery disease.  He states that he is in good health and golfs 3 days a week Patient is here for a lump on the side of his face.  He noticed it a couple weeks ago.  It has not changed.  It is not painful.  He does not have any pain with chewing.  No pain in his jaw.  No pain in his ear. He does have a history of smoking a long time ago.  Quit in 1965 I asked patient about any skin infections in his face, scalp, ear, or teeth.  Concern for lymph node.  Patient states that he has been using a cream on that side of his face to help reduce his skin cancers.  Past Medical History:  Diagnosis Date  . Anemia   . Antral gastritis 2013   EGD   . Atrial fibrillation ([place])   . CAD (coronary artery disease)    s/p CABG 1993 (L-LAD, S-RI, S-D1);   echo 1/10: EF 55%;    myoview 12/09: inf MI, no ischemia  . Candida esophagitis ([place]) 2013   EGD   . Cataracts, bilateral   . Family history of malignant neoplasm of gastrointestinal tract   . Folliculitis   . Hiatal hernia   . HTN (hypertension)   . Hyperlipidemia   . Irritable bladder   . Myocardial infarction ([place])   . Obesity   . Osteoarthritis     Patient Active Problem List   Diagnosis Date Noted  . Left hip pain 10/18/2019  . Lumbar facet joint syndrome 04/16/2019  . CHF (congestive heart failure) ([place]) 12/17/2016  . PCP NOTES >>>>> 10/11/2015  . Carpal tunnel syndrome 02/15/2014  . Anemia 08/14/2012  . Annual physical exam 01/08/2012  . Atrial fibrillation ([place]) 03/29/2011  . Venous (peripheral) insufficiency 09/04/2009  . OBESITY 07/04/2009  . DJD  (degenerative joint disease) 07/04/2009  . SLEEP DISORDER 07/04/2009  . Dyspepsia 02/28/2009  . HEMATOCHEZIA 02/07/2009  . DM II (diabetes mellitus, type II), controlled ([place]) 04/25/2008  . Hyperlipidemia 06/25/2007  . Essential hypertension 06/25/2007  . Coronary atherosclerosis 06/25/2007    Past Surgical History:  Procedure Laterality Date  . CATARACT EXTRACTION Right 07/21/2018  . CHOLECYSTECTOMY     laparoscopic '92  . CORONARY ARTERY BYPASS GRAFT     graft '92: LIMA-LAD, SVG - D2,R1, D1  . ENTEROSCOPY  08/22/2012   Procedure: ENTEROSCOPY;  Surgeon: Juanita Craver, MD;  Location: [facility] ENDOSCOPY;  Service: Endoscopy;  Laterality: N/A;  . INTRAOCULAR LENS EXCHANGE Right 07/21/2018  . KNEE SURGERY    . MOLE REMOVAL  2001 and 2008  . PILONIDAL CYST EXCISION         Home Medications    Prior to Admission medications   Medication Sig Start Date End Date Taking? Authorizing Provider  atorvastatin (LIPITOR) 80 MG tablet Take 0.5 tablets (40 mg total) by mouth daily. 07/31/20  Yes Paz, Alda Berthold, MD  benazepril (LOTENSIN) 40 MG tablet Take 1 tablet (40 mg total) by mouth daily. 10/04/20  Yes Kirk Ruths  S, MD  Coenzyme Q10 (COQ10) 100 MG CAPS Take 1 capsule by mouth daily.    Yes [provider]  diphenhydramine-acetaminophen (TYLENOL PM) 25-500 MG TABS tablet Take 2 tablets by mouth at bedtime.   Yes [provider]  furosemide (LASIX) 40 MG tablet Take 1 tablet (40 mg total) by mouth daily. 02/23/21  Yes Paz, Alda Berthold, MD  Melatonin 10 MG TABS Take 10 mg by mouth at bedtime.    Yes [provider]  metoprolol succinate (TOPROL-XL) 25 MG 24 hr tablet Take 1 tablet (25 mg total) by mouth daily. 11/27/20  Yes Paz, Alda Berthold, MD  Multiple Vitamin (MULTIVITAMIN WITH MINERALS) TABS tablet Take 1 tablet by mouth daily.   Yes [provider]  pantoprazole (PROTONIX) 40 MG tablet Take 1 tablet (40 mg total) by mouth daily. 06/19/20  Yes Paz, Alda Berthold, MD   Rivaroxaban (XARELTO) 15 MG TABS tablet Take 1 tablet (15 mg total) by mouth daily with supper. 01/10/21  Yes Lelon Perla, MD  tamsulosin (FLOMAX) 0.4 MG CAPS capsule TAKE 1 CAPSULE(0.4 MG) BY MOUTH DAILY 11/29/20  Yes Paz, Alda Berthold, MD  traMADol (ULTRAM) 50 MG tablet Take 1 tablet (50 mg total) by mouth 3 (three) times daily as needed. 07/05/20  Yes Silverio Decamp, MD  hydrALAZINE (APRESOLINE) 25 MG tablet Take 1 tablet (25 mg total) by mouth 3 (three) times daily. 11/01/20 01/30/21  Lelon Perla, MD    Family History Family History  Problem Relation Age of Onset  . Coronary artery disease Father        and brother  . Heart attack Father        and brother-fatal  . Stroke Father   . Breast cancer Mother   . Alzheimer's disease Mother   . Colon cancer Maternal Uncle   . Esophageal cancer Neg Hx   . Stomach cancer Neg Hx   . Rectal cancer Neg Hx   . Prostate cancer Neg Hx     Social History Social History   Tobacco Use  . Smoking status: Former Smoker    Years: 16.00    Quit date: 08/14/1964    Years since quitting: 56.6  . Smokeless tobacco: Never Used  . Tobacco comment: quit 1965  Vaping Use  . Vaping Use: Never used  Substance Use Topics  . Alcohol use: Yes    Alcohol/week: 7.0 standard drinks    Types: 7 Shots of liquor per week    Comment: brandy at night  . Drug use: No     Allergies   Patient has no known allergies.   Review of Systems Review of Systems See HPI  Physical Exam Triage Vital Signs ED Triage Vitals  Enc Vitals Group     BP 03/27/21 0840 (!) 160/66     Pulse Rate 03/27/21 0840 (!) 56     Resp 03/27/21 0840 16     Temp 03/27/21 0840 97.6 F (36.4 C)     Temp Source 03/27/21 0840 Oral     SpO2 03/27/21 0840 92 %     Weight --      Height --      Head Circumference --      Peak Flow --      Pain Score 03/27/21 0841 1     Pain Loc --      Pain Edu? --      Excl. in [place]? --    No data found.  Updated  Vital Signs BP  (!) 160/66 (BP Location: Left Arm)   Pulse (!) 56   Temp 97.6 F (36.4 C) (Oral)   Resp 16   SpO2 92%      Physical Exam Constitutional:      General: He is not in acute distress.    Appearance: He is well-developed.     Comments: Pleasant.  Appears vigorous  HENT:     Head: Normocephalic and atraumatic.   Eyes:     Conjunctiva/sclera: Conjunctivae normal.     Pupils: Pupils are equal, round, and reactive to light.  Cardiovascular:     Rate and Rhythm: Normal rate.  Pulmonary:     Effort: Pulmonary effort is normal. No respiratory distress.  Abdominal:     General: There is no distension.     Palpations: Abdomen is soft.  Musculoskeletal:        General: Normal range of motion.     Cervical back: Normal range of motion.  Skin:    General: Skin is warm and dry.  Neurological:     Mental Status: He is alert.  Psychiatric:        Behavior: Behavior normal.      UC Treatments / Results  Labs (all labs ordered are listed, but only abnormal results are displayed) Labs Reviewed - No data to display  EKG   Radiology US SOFT TISSUE HEAD & NECK (NON-THYROID)  Result Date: 03/27/2021 CLINICAL DATA:  Palpable mass.  No injury or pain. EXAM: ULTRASOUND OF HEAD/NECK SOFT TISSUES TECHNIQUE: Ultrasound examination of the head and neck soft tissues was performed in the area of clinical concern. COMPARISON:  None. FINDINGS: A 2.3 x 1.7 x 1.8 cm hypoechoic mass lesion is noted inferior and posterior to the right parotid gland. There is central increased echogenicity and vascularity suggesting this is an enlarged lymph node. No other focal mass lesion is evident. IMPRESSION: 1. 2.3 cm mass lesion adjacent to the right parotid gland likely represents an enlarged lymph node. 2. No primary lesion identified. Recommend CT of the neck with contrast for further evaluation. These results will be called to the ordering clinician or representative by the Radiologist Assistant, and communication  documented in the PACS or [employer]. Electronically Signed   By: San Morelle M.D.   On: 03/27/2021 10:59    Procedures Procedures (including critical care time)  Medications Ordered in UC Medications - No data to display  Initial Impression / Assessment and Plan / UC Course  I have reviewed the triage vital signs and the nursing notes.  Pertinent labs & imaging results that were available during my care of the patient were reviewed by me and considered in my medical decision making (see chart for details).     Patient is called with his CT test result.  I will send a message to his primary care doctor, Dr. Larose Kells.  He needs follow-up with Dr. Larose Kells for decision regarding CT scan, biopsy, reevaluation over time Final Clinical Impressions(s) / UC Diagnoses   Final diagnoses:  Mass in neck     Discharge Instructions     I will call you with test results    ED Prescriptions    None     PDMP not reviewed this encounter.   Raylene Everts, MD 03/27/21 1122    Raylene Everts, MD 03/27/21 [phone]

## 2021-03-27 NOTE — Telephone Encounter (Signed)
Called patient back at 1:00.  He is not at home at this time.  His significant other, Daron Offer, answered the phone and informed me that she is on his list of people to obtain medical information.  I confirmed that she has in his demographic.  I told her the [place] seconds noted to follow-up with Dr. Larose Kells

## 2021-03-27 NOTE — Telephone Encounter (Signed)
Left message with patient to call me about his ultrasound report

## 2021-03-27 NOTE — ED Triage Notes (Signed)
Patient c/o swollen area on the right side of face.  It has been there for several weeks, no injury, no pain.  Patient is vaccinated.

## 2021-04-02 ENCOUNTER — Telehealth: Payer: Self-pay | Admitting: Internal Medicine

## 2021-04-02 NOTE — Telephone Encounter (Signed)
Please arrange a visit this week, was seen recently at the urgent care with a lump on the neck.  If he has any severe symptoms let me know.

## 2021-04-03 NOTE — Telephone Encounter (Signed)
Appt scheduled for Friday. According to Shirlee Limerick lump is going down. Patient is feeling much better.

## 2021-04-03 NOTE — Telephone Encounter (Signed)
Noted, thank you

## 2021-04-06 ENCOUNTER — Ambulatory Visit (INDEPENDENT_AMBULATORY_CARE_PROVIDER_SITE_OTHER): Payer: Medicare Other | Admitting: Internal Medicine

## 2021-04-06 ENCOUNTER — Other Ambulatory Visit: Payer: Self-pay

## 2021-04-06 ENCOUNTER — Encounter: Payer: Self-pay | Admitting: Internal Medicine

## 2021-04-06 ENCOUNTER — Ambulatory Visit (HOSPITAL_BASED_OUTPATIENT_CLINIC_OR_DEPARTMENT_OTHER)
Admission: RE | Admit: 2021-04-06 | Discharge: 2021-04-06 | Disposition: A | Discharge status: Home / Self Care | Payer: Medicare Other | Source: Ambulatory Visit | Attending: Internal Medicine | Admitting: Internal Medicine

## 2021-04-06 VITALS — BP 142/70 | HR 62 | Temp 98.3°F | Resp 16 | Ht 71.0 in | Wt 227.5 lb

## 2021-04-06 DIAGNOSIS — I10 Essential (primary) hypertension: Secondary | ICD-10-CM

## 2021-04-06 DIAGNOSIS — E1159 Type 2 diabetes mellitus with other circulatory complications: Secondary | ICD-10-CM | POA: Diagnosis not present

## 2021-04-06 DIAGNOSIS — I25810 Atherosclerosis of coronary artery bypass graft(s) without angina pectoris: Secondary | ICD-10-CM | POA: Diagnosis not present

## 2021-04-06 DIAGNOSIS — Z951 Presence of aortocoronary bypass graft: Secondary | ICD-10-CM | POA: Diagnosis not present

## 2021-04-06 DIAGNOSIS — R221 Localized swelling, mass and lump, neck: Secondary | ICD-10-CM

## 2021-04-06 DIAGNOSIS — R0601 Orthopnea: Secondary | ICD-10-CM | POA: Diagnosis not present

## 2021-04-06 NOTE — Progress Notes (Signed)
Subjective:    Patient ID: [name], male    DOB: 03/12/1931, 87 y.o.   MRN: [id number]  DOS:  04/06/2021 Type of visit - description: Acute  Here for a urgent care follow-up, also chronic issues reviewed.  The patient noted a lump near the right parotid gland earlier on April, went to urgent care on 03/27/2021   A US show a 2.3 cm mass adjacent to the R parotid gland, likely enlarged lymph node.  No primary lesion identified, a CT was recommended. He is here for follow-up. Denies pain at the area, no pain when he chews, no dental type of pain. The lump has not increased in size but seems harder to him.  DM: Due for A1c  History of CAD:  The patient reports that sometimes he lays down and feels a slightly short of breath. Denies chest pain Lower extremity edema at baseline No palpitations No cough.     Wt Readings from Last 3 Encounters:  04/06/21 227 lb 8 oz (103.2 kg)  12/06/20 227 lb 6 oz (103.1 kg)  10/04/20 226 lb (102.5 kg)   Review of Systems See above   Past Medical History:  Diagnosis Date  . Anemia   . Antral gastritis 2013   EGD   . Atrial fibrillation ([place])   . CAD (coronary artery disease)    s/p CABG 1993 (L-LAD, S-RI, S-D1);   echo 1/10: EF 55%;    myoview 12/09: inf MI, no ischemia  . Candida esophagitis ([place]) 2013   EGD   . Cataracts, bilateral   . Family history of malignant neoplasm of gastrointestinal tract   . Folliculitis   . Hiatal hernia   . HTN (hypertension)   . Hyperlipidemia   . Irritable bladder   . Myocardial infarction ([place])   . Obesity   . Osteoarthritis     Past Surgical History:  Procedure Laterality Date  . CATARACT EXTRACTION Right 07/21/2018  . CHOLECYSTECTOMY     laparoscopic '92  . CORONARY ARTERY BYPASS GRAFT     graft '92: LIMA-LAD, SVG - D2,R1, D1  . ENTEROSCOPY  08/22/2012   Procedure: ENTEROSCOPY;  Surgeon: Juanita Craver, MD;  Location: [facility];  Service: Endoscopy;  Laterality: N/A;  . INTRAOCULAR LENS  EXCHANGE Right 07/21/2018  . KNEE SURGERY    . MOLE REMOVAL  2001 and 2008  . PILONIDAL CYST EXCISION      Allergies as of 04/06/2021   No Known Allergies     Medication List       Accurate as of April 06, 2021 11:59 PM. If you have any questions, ask your nurse or doctor.        atorvastatin 80 MG tablet Commonly known as: LIPITOR Take 0.5 tablets (40 mg total) by mouth daily.   benazepril 40 MG tablet Commonly known as: LOTENSIN Take 1 tablet (40 mg total) by mouth daily.   CoQ10 100 MG Caps Take 1 capsule by mouth daily.   diphenhydramine-acetaminophen 25-500 MG Tabs tablet Commonly known as: TYLENOL PM Take 2 tablets by mouth at bedtime.   furosemide 40 MG tablet Commonly known as: LASIX Take 1 tablet (40 mg total) by mouth daily.   hydrALAZINE 25 MG tablet Commonly known as: APRESOLINE Take 1 tablet (25 mg total) by mouth 3 (three) times daily.   Melatonin 10 MG Tabs Take 10 mg by mouth at bedtime.   metoprolol succinate 25 MG 24 hr tablet Commonly known as: TOPROL-XL Take 1 tablet (  25 mg total) by mouth daily.   multivitamin with minerals Tabs tablet Take 1 tablet by mouth daily.   pantoprazole 40 MG tablet Commonly known as: PROTONIX Take 1 tablet (40 mg total) by mouth daily.   Rivaroxaban 15 MG Tabs tablet Commonly known as: Xarelto Take 1 tablet (15 mg total) by mouth daily with supper.   tamsulosin 0.4 MG Caps capsule Commonly known as: FLOMAX TAKE 1 CAPSULE(0.4 MG) BY MOUTH DAILY   traMADol 50 MG tablet Commonly known as: ULTRAM Take 1 tablet (50 mg total) by mouth 3 (three) times daily as needed.          Objective:   Physical Exam Neck:     BP (!) 142/70 (BP Location: Left Arm, Patient Position: Sitting, Cuff Size: Normal)   Pulse 62   Temp 98.3 F (36.8 C) (Oral)   Resp 16   Ht 5\' 11"  (1.803 m)   Wt 227 lb 8 oz (103.2 kg)   SpO2 98%   BMI 31.73 kg/m  General:   Well developed, NAD, BMI noted. HEENT:  Normocephalic  . Face symmetric, atraumatic Neck: JVD slightly elevated?Marland Kitchen No thyromegaly. Other than the mass adjacent to the right parotid gland, the exam show no lymphadenopathies.  The parotid glands themselves are symmetric. Lymphatic system: No lymphadenopathies on the armpits or groins. Lungs:  Slightly decreased breath sounds at bases Normal respiratory effort, no intercostal retractions, no accessory muscle use. Heart: Bradycardic Lower extremities:  +/+++ pretibial edema bilaterally  Skin: Not pale. Not jaundice Neurologic:  alert & oriented X3.  Speech normal, gait appropriate for age and unassisted Psych--  Cognition and judgment appear intact.  Cooperative with normal attention span and concentration.  Behavior appropriate. No anxious or depressed appearing.      Assessment     Assessment  DM: diet control, no neuropathy HTN Hyperlipidemia CRI  CV: --CAD, CABG 1993, Myoview 2009 no ischemia. --Atrial fibrillation -- rate control, xarelto -- chronic combined systolic/diastolic congestive heart failure Venous insufficiency, mild LE edema L>>R  LUTS    MSK: DJD, spinal stenosis, R wrist Fx in the 80s GI: --Recurrent melena: Work-up 2013 Dr. Sharlett Iles: Colonoscopy, EGD, capsule endoscopy.  Felt to be due to NSAIDs --Candida esophagitis, antral gastritis --->  2013 per EGD --HH Sees derm x 2/year  PLAN: Neck mass: Per ultrasound is adjacent to the parotid glands, parotid glands themselves seems normal.  The rest of the neck is essentially normal. Refer to ENT. Last creatinine elevated, will defer next imaging to ENT.  (CT without?  MRI without?) [id number] A1c HTN, seems well controlled, check a BMP and CBC CAD: Reports orthopnea, weight  is stable, lower extremity edema at baseline, for completeness we will get a chest x-ray to get a better idea of his volume status.  No chest pain or palpitations RTC 3 months   This visit occurred during the SARS-CoV-2 public health  emergency.  Safety protocols were in place, including screening questions prior to the visit, additional usage of staff PPE, and extensive cleaning of exam room while observing appropriate contact time as indicated for disinfecting solutions.

## 2021-04-06 NOTE — Patient Instructions (Addendum)
We are referring you to the ENT doctors in [facility] : Get the blood work     [place], Joes back for a check in 3 months   STOP BY THE FIRST FLOOR:  get the XR

## 2021-04-07 LAB — CBC WITH DIFFERENTIAL/PLATELET
Absolute Monocytes: 805 cells/uL (ref 200–950)
Basophils Absolute: 28 cells/uL (ref 0–200)
Basophils Relative: 0.4 %
Eosinophils Absolute: 84 cells/uL (ref 15–500)
Eosinophils Relative: 1.2 %
HCT: 37.6 % — ABNORMAL LOW (ref 38.5–50.0)
Hemoglobin: 12.7 g/dL — ABNORMAL LOW (ref 13.2–17.1)
Lymphs Abs: 1904 cells/uL (ref 850–3900)
MCH: 32.3 pg (ref 27.0–33.0)
MCHC: 33.8 g/dL (ref 32.0–36.0)
MCV: 95.7 fL (ref 80.0–100.0)
MPV: 11.1 fL (ref 7.5–12.5)
Monocytes Relative: 11.5 %
Neutro Abs: 4179 cells/uL (ref 1500–7800)
Neutrophils Relative %: 59.7 %
Platelets: 150 10*3/uL (ref 140–400)
RBC: 3.93 10*6/uL — ABNORMAL LOW (ref 4.20–5.80)
RDW: 13.1 % (ref 11.0–15.0)
Total Lymphocyte: 27.2 %
WBC: 7 10*3/uL (ref 3.8–10.8)

## 2021-04-07 LAB — BASIC METABOLIC PANEL
BUN/Creatinine Ratio: 15 (calc) (ref 6–22)
BUN: 25 mg/dL (ref 7–25)
CO2: 24 mmol/L (ref 20–32)
Calcium: 9.2 mg/dL (ref 8.6–10.3)
Chloride: 106 mmol/L (ref 98–110)
Creat: 1.68 mg/dL — ABNORMAL HIGH (ref 0.70–1.11)
Glucose, Bld: 118 mg/dL — ABNORMAL HIGH (ref 65–99)
Potassium: 3.9 mmol/L (ref 3.5–5.3)
Sodium: 140 mmol/L (ref 135–146)

## 2021-04-07 LAB — HEMOGLOBIN A1C
Hgb A1c MFr Bld: 6.9 % of total Hgb — ABNORMAL HIGH (ref <5.7)
Mean Plasma Glucose: 151 mg/dL
eAG (mmol/L): 8.4 mmol/L

## 2021-04-07 NOTE — Assessment & Plan Note (Signed)
Neck mass: Per ultrasound is adjacent to the parotid glands, parotid glands themselves seems normal.  The rest of the neck is essentially normal. Refer to ENT. Last creatinine elevated, will defer next imaging to ENT.  (CT without?  MRI without?) [id number] A1c HTN, seems well controlled, check a BMP and CBC CAD: Reports orthopnea, weight  is stable, lower extremity edema at baseline, for completeness we will get a chest x-ray to get a better idea of his volume status.  No chest pain or palpitations RTC 3 months

## 2021-04-09 ENCOUNTER — Ambulatory Visit: Payer: Medicare Other | Admitting: Internal Medicine

## 2021-05-02 DIAGNOSIS — R591 Generalized enlarged lymph nodes: Secondary | ICD-10-CM | POA: Diagnosis not present

## 2021-05-02 DIAGNOSIS — L570 Actinic keratosis: Secondary | ICD-10-CM | POA: Diagnosis not present

## 2021-05-14 DIAGNOSIS — R221 Localized swelling, mass and lump, neck: Secondary | ICD-10-CM | POA: Diagnosis not present

## 2021-05-14 DIAGNOSIS — Z85828 Personal history of other malignant neoplasm of skin: Secondary | ICD-10-CM | POA: Diagnosis not present

## 2021-05-21 DIAGNOSIS — R221 Localized swelling, mass and lump, neck: Secondary | ICD-10-CM | POA: Diagnosis not present

## 2021-05-24 ENCOUNTER — Other Ambulatory Visit: Payer: Self-pay | Admitting: Internal Medicine

## 2021-05-28 DIAGNOSIS — R221 Localized swelling, mass and lump, neck: Secondary | ICD-10-CM | POA: Diagnosis not present

## 2021-05-28 DIAGNOSIS — C7989 Secondary malignant neoplasm of other specified sites: Secondary | ICD-10-CM | POA: Diagnosis not present

## 2021-06-06 ENCOUNTER — Telehealth: Payer: Self-pay | Admitting: *Deleted

## 2021-06-06 NOTE — Telephone Encounter (Addendum)
   Name: JAIDIN RICHISON  DOB: Jan 23, 1931  MRN: [id number]   Primary Cardiologist: Kirk Ruths, MD  Chart reviewed as part of pre-operative protocol coverage. Patient was contacted 06/06/2021 in reference to pre-operative risk assessment for pending surgery as outlined below.  JERNARD REIBER was last seen on 10/04/20 by Dr. Stanford Breed.  Since that day, CANYON LOHR has done well. He can complete more than 4.0 METS (rode stationary bike for 30 min this morning).   Per our clinical pharmacist: Per office protocol, patient can hold Xarelto for 3 days prior to procedure.   Patient will not need bridging with Lovenox (enoxaparin) around procedure  Therefore, based on ACC/AHA guidelines, the patient would be at acceptable risk for the planned procedure without further cardiovascular testing.   The patient was advised that if he develops new symptoms prior to surgery to contact our office to arrange for a follow-up visit, and he verbalized understanding.  I will route this recommendation to the requesting party via Epic fax function and remove from pre-op pool. Please call with questions.  Tami Lin Cera Rorke, PA 06/06/2021, 1:33 PM

## 2021-06-06 NOTE — Telephone Encounter (Signed)
   [facility] Pre-operative Risk Assessment    Patient Name: [name]  DOB: [birth year]  MRN: [id number]     Request for surgical clearance:  What type of surgery is being performed? RIGHT PAROTIDECTOMY    When is this surgery scheduled? 06/18/21   What type of clearance is required (medical clearance vs. Pharmacy clearance to hold med vs. Both)? MEDICAL  Are there any medications that need to be held prior to surgery and how long? XARELTO 15 mg 72 HOURS PRIOR TO SURGERY   Practice name and name of physician performing surgery?  [facility]; DR [name]    What is the office phone number? [phone]  ATTN :KAYLA   7.   What is the office fax number? [place]  Attn: Kayla  8.   Anesthesia type (None, local, MAC, general) ? unknown   [name] 06/06/2021, 10:30 AM  _________________________________________________________________   (provider comments below)

## 2021-06-06 NOTE — Telephone Encounter (Signed)
Patient with diagnosis of A Fib on Xarelto for anticoagulation.    Procedure:  RIGHT PAROTIDECTOMY   Date of procedure: 06/18/21   CHA2DS2-VASc Score = 6  This indicates a 9.7% annual risk of stroke. The patient's score is based upon: CHF History: Yes HTN History: Yes Diabetes History: Yes Stroke History: No Vascular Disease History: Yes Age Score: 2 Gender Score: 0    CrCl 45 mL/min Platelet count 150K   Per office protocol, patient can hold Xarelto for 3 days prior to procedure.   Patient will not need bridging with Lovenox (enoxaparin) around procedure.

## 2021-06-18 DIAGNOSIS — K219 Gastro-esophageal reflux disease without esophagitis: Secondary | ICD-10-CM | POA: Diagnosis not present

## 2021-06-18 DIAGNOSIS — R221 Localized swelling, mass and lump, neck: Secondary | ICD-10-CM | POA: Diagnosis not present

## 2021-06-18 DIAGNOSIS — Z79899 Other long term (current) drug therapy: Secondary | ICD-10-CM | POA: Diagnosis not present

## 2021-06-18 DIAGNOSIS — Z951 Presence of aortocoronary bypass graft: Secondary | ICD-10-CM | POA: Diagnosis not present

## 2021-06-18 DIAGNOSIS — I509 Heart failure, unspecified: Secondary | ICD-10-CM | POA: Diagnosis not present

## 2021-06-18 DIAGNOSIS — C44222 Squamous cell carcinoma of skin of right ear and external auricular canal: Secondary | ICD-10-CM | POA: Diagnosis not present

## 2021-06-18 DIAGNOSIS — Z7901 Long term (current) use of anticoagulants: Secondary | ICD-10-CM | POA: Diagnosis not present

## 2021-06-18 DIAGNOSIS — C7989 Secondary malignant neoplasm of other specified sites: Secondary | ICD-10-CM | POA: Diagnosis not present

## 2021-06-18 DIAGNOSIS — I10 Essential (primary) hypertension: Secondary | ICD-10-CM | POA: Diagnosis not present

## 2021-06-18 DIAGNOSIS — I2510 Atherosclerotic heart disease of native coronary artery without angina pectoris: Secondary | ICD-10-CM | POA: Diagnosis not present

## 2021-06-18 DIAGNOSIS — E7800 Pure hypercholesterolemia, unspecified: Secondary | ICD-10-CM | POA: Diagnosis not present

## 2021-06-18 DIAGNOSIS — I252 Old myocardial infarction: Secondary | ICD-10-CM | POA: Diagnosis not present

## 2021-06-18 DIAGNOSIS — I110 Hypertensive heart disease with heart failure: Secondary | ICD-10-CM | POA: Diagnosis not present

## 2021-06-18 DIAGNOSIS — Z87891 Personal history of nicotine dependence: Secondary | ICD-10-CM | POA: Diagnosis not present

## 2021-06-18 HISTORY — PX: PAROTIDECTOMY: SUR1003

## 2021-06-19 DIAGNOSIS — C44222 Squamous cell carcinoma of skin of right ear and external auricular canal: Secondary | ICD-10-CM | POA: Diagnosis not present

## 2021-06-19 DIAGNOSIS — I252 Old myocardial infarction: Secondary | ICD-10-CM | POA: Diagnosis not present

## 2021-06-19 DIAGNOSIS — Z87891 Personal history of nicotine dependence: Secondary | ICD-10-CM | POA: Diagnosis not present

## 2021-06-19 DIAGNOSIS — K219 Gastro-esophageal reflux disease without esophagitis: Secondary | ICD-10-CM | POA: Diagnosis not present

## 2021-06-19 DIAGNOSIS — E7800 Pure hypercholesterolemia, unspecified: Secondary | ICD-10-CM | POA: Diagnosis not present

## 2021-06-19 DIAGNOSIS — Z951 Presence of aortocoronary bypass graft: Secondary | ICD-10-CM | POA: Diagnosis not present

## 2021-06-19 DIAGNOSIS — C7989 Secondary malignant neoplasm of other specified sites: Secondary | ICD-10-CM | POA: Diagnosis not present

## 2021-06-19 DIAGNOSIS — Z79899 Other long term (current) drug therapy: Secondary | ICD-10-CM | POA: Diagnosis not present

## 2021-06-19 DIAGNOSIS — I10 Essential (primary) hypertension: Secondary | ICD-10-CM | POA: Diagnosis not present

## 2021-06-19 DIAGNOSIS — Z7901 Long term (current) use of anticoagulants: Secondary | ICD-10-CM | POA: Diagnosis not present

## 2021-06-19 DIAGNOSIS — I2510 Atherosclerotic heart disease of native coronary artery without angina pectoris: Secondary | ICD-10-CM | POA: Diagnosis not present

## 2021-07-09 ENCOUNTER — Other Ambulatory Visit: Payer: Self-pay

## 2021-07-09 ENCOUNTER — Ambulatory Visit (INDEPENDENT_AMBULATORY_CARE_PROVIDER_SITE_OTHER): Payer: Medicare Other | Admitting: Internal Medicine

## 2021-07-09 ENCOUNTER — Ambulatory Visit: Payer: Medicare Other | Attending: Internal Medicine

## 2021-07-09 VITALS — BP 132/70 | HR 49 | Temp 97.7°F | Resp 18 | Ht 71.0 in | Wt 222.5 lb

## 2021-07-09 DIAGNOSIS — E1159 Type 2 diabetes mellitus with other circulatory complications: Secondary | ICD-10-CM

## 2021-07-09 DIAGNOSIS — E7800 Pure hypercholesterolemia, unspecified: Secondary | ICD-10-CM | POA: Diagnosis not present

## 2021-07-09 DIAGNOSIS — I10 Essential (primary) hypertension: Secondary | ICD-10-CM | POA: Diagnosis not present

## 2021-07-09 DIAGNOSIS — Z23 Encounter for immunization: Secondary | ICD-10-CM

## 2021-07-09 LAB — CBC WITH DIFFERENTIAL/PLATELET
Basophils Absolute: 0 10*3/uL (ref 0.0–0.1)
Basophils Relative: 0.3 % (ref 0.0–3.0)
Eosinophils Absolute: 0.1 10*3/uL (ref 0.0–0.7)
Eosinophils Relative: 2.1 % (ref 0.0–5.0)
HCT: 36 % — ABNORMAL LOW (ref 39.0–52.0)
Hemoglobin: 12.1 g/dL — ABNORMAL LOW (ref 13.0–17.0)
Lymphocytes Relative: 22.7 % (ref 12.0–46.0)
Lymphs Abs: 1.5 10*3/uL (ref 0.7–4.0)
MCHC: 33.7 g/dL (ref 30.0–36.0)
MCV: 97.1 fl (ref 78.0–100.0)
Monocytes Absolute: 0.5 10*3/uL (ref 0.1–1.0)
Monocytes Relative: 7.1 % (ref 3.0–12.0)
Neutro Abs: 4.6 10*3/uL (ref 1.4–7.7)
Neutrophils Relative %: 67.8 % (ref 43.0–77.0)
Platelets: 185 10*3/uL (ref 150.0–400.0)
RBC: 3.7 Mil/uL — ABNORMAL LOW (ref 4.22–5.81)
RDW: 14 % (ref 11.5–15.5)
WBC: 6.8 10*3/uL (ref 4.0–10.5)

## 2021-07-09 LAB — HEMOGLOBIN A1C: Hgb A1c MFr Bld: 7.2 % — ABNORMAL HIGH (ref 4.6–6.5)

## 2021-07-09 LAB — AST: AST: 19 U/L (ref 0–37)

## 2021-07-09 LAB — BASIC METABOLIC PANEL
BUN: 25 mg/dL — ABNORMAL HIGH (ref 6–23)
CO2: 28 mEq/L (ref 19–32)
Calcium: 9.5 mg/dL (ref 8.4–10.5)
Chloride: 105 mEq/L (ref 96–112)
Creatinine, Ser: 1.74 mg/dL — ABNORMAL HIGH (ref 0.40–1.50)
GFR: 34.78 mL/min — ABNORMAL LOW (ref >60.00)
Glucose, Bld: 133 mg/dL — ABNORMAL HIGH (ref 70–99)
Potassium: 4.3 mEq/L (ref 3.5–5.1)
Sodium: 141 mEq/L (ref 135–145)

## 2021-07-09 LAB — LIPID PANEL
Cholesterol: 109 mg/dL (ref 0–200)
HDL: 30.3 mg/dL — ABNORMAL LOW (ref >39.00)
LDL Cholesterol: 46 mg/dL (ref 0–99)
NonHDL: 78.57
Total CHOL/HDL Ratio: 4
Triglycerides: 165 mg/dL — ABNORMAL HIGH (ref 0.0–149.0)
VLDL: 33 mg/dL (ref 0.0–40.0)

## 2021-07-09 LAB — ALT: ALT: 15 U/L (ref 0–53)

## 2021-07-09 NOTE — Progress Notes (Signed)
Subjective:    Patient ID: [name], male    DOB: [birth year], 87 y.o.   MRN: [id number]  DOS:  07/09/2021 Type of visit - description: f/u  Since the last visit, underwent surgery at the parotid gland, recuperating well. Good compliance with medication except occasionally forgets the midday hydralazine dose. Occasionally has urinary incontinence, cannot reach the bathroom in time.  Denies hematuria, difficulty urinating.   Review of Systems See above   Past Medical History:  Diagnosis Date   Anemia    Antral gastritis 2013   EGD    Atrial fibrillation ([facility])    CAD (coronary artery disease)    s/p CABG 1993 (L-LAD, S-RI, S-D1);   echo 1/10: EF 55%;    myoview 12/09: inf MI, no ischemia   Candida esophagitis ([place]) 2013   EGD    Cataracts, bilateral    Family history of malignant neoplasm of gastrointestinal tract    Folliculitis    Hiatal hernia    HTN (hypertension)    Hyperlipidemia    Irritable bladder    Myocardial infarction [facility])    Obesity    Osteoarthritis     Past Surgical History:  Procedure Laterality Date   CATARACT EXTRACTION Right 07/21/2018   CHOLECYSTECTOMY     laparoscopic '92   CORONARY ARTERY BYPASS GRAFT     graft '92: LIMA-LAD, SVG - D2,R1, D1   ENTEROSCOPY  08/22/2012   Procedure: ENTEROSCOPY;  Surgeon: Juanita Craver, MD;  Location: [facility];  Service: Endoscopy;  Laterality: N/A;   INTRAOCULAR LENS EXCHANGE Right 07/21/2018   KNEE SURGERY     MOLE REMOVAL  2001 and 2008   PAROTIDECTOMY Right 06/18/2021   PILONIDAL CYST EXCISION      Allergies as of 07/09/2021   No Known Allergies      Medication List        Accurate as of July 09, 2021 10:32 AM. If you have any questions, ask your nurse or doctor.          atorvastatin 80 MG tablet Commonly known as: LIPITOR Take 0.5 tablets (40 mg total) by mouth daily.   benazepril 40 MG tablet Commonly known as: LOTENSIN Take 1 tablet (40 mg total) by mouth daily.   CoQ10 100 MG  Caps Take 1 capsule by mouth daily.   diphenhydramine-acetaminophen 25-500 MG Tabs tablet Commonly known as: TYLENOL PM Take 2 tablets by mouth at bedtime.   furosemide 40 MG tablet Commonly known as: LASIX Take 1 tablet (40 mg total) by mouth daily.   hydrALAZINE 25 MG tablet Commonly known as: APRESOLINE Take 1 tablet (25 mg total) by mouth 3 (three) times daily.   Melatonin 10 MG Tabs Take 10 mg by mouth at bedtime.   metoprolol succinate 25 MG 24 hr tablet Commonly known as: TOPROL-XL Take 1 tablet (25 mg total) by mouth daily. Take with or immediately following a meal   multivitamin with minerals Tabs tablet Take 1 tablet by mouth daily.   pantoprazole 40 MG tablet Commonly known as: PROTONIX Take 1 tablet (40 mg total) by mouth daily.   Rivaroxaban 15 MG Tabs tablet Commonly known as: Xarelto Take 1 tablet (15 mg total) by mouth daily with supper.   tamsulosin 0.4 MG Caps capsule Commonly known as: FLOMAX TAKE 1 CAPSULE(0.4 MG) BY MOUTH DAILY   traMADol 50 MG tablet Commonly known as: ULTRAM Take 1 tablet (50 mg total) by mouth 3 (three) times daily as needed.  Objective:   Physical Exam BP 132/70 (BP Location: Left Arm, Patient Position: Sitting, Cuff Size: Normal)   Pulse (!) 49   Temp 97.7 F (36.5 C) (Oral)   Resp 18   Ht '5\' 11"'$  (1.803 m)   Wt 222 lb 8 oz (100.9 kg)   SpO2 95%   BMI 31.03 kg/m  General:   Well developed, NAD, BMI noted.  HEENT:  Normocephalic . Face symmetric, surgical scar with no redness or discharge. Lungs:  CTA B Normal respiratory effort, no intercostal retractions, no accessory muscle use. Heart: Bradycardic Abdomen:  Not distended, soft, non-tender. No rebound or rigidity.   Skin: Not pale. Not jaundice Lower extremities: Trace pretibial edema L>R Neurologic:  alert & oriented X3.  Speech normal, gait appropriate for age and unassisted.  Transferring somewhat limited by DJD of the knees Psych--   Cognition and judgment appear intact.  Cooperative with normal attention span and concentration.  Behavior appropriate. No anxious or depressed appearing.     Assessment    Assessment  DM: diet control, no neuropathy HTN Hyperlipidemia CRI  CV: --CAD, CABG 1993, Myoview 2009 no ischemia. --Atrial fibrillation -- rate control, xarelto -- chronic combined systolic/diastolic congestive heart failure Venous insufficiency, mild LE edema L>>R  LUTS    MSK: DJD, spinal stenosis, R wrist Fx in the 80s GI: --Recurrent melena: Work-up 2013 Dr. Sharlett Iles: Colonoscopy, EGD, capsule endoscopy.  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  Consider meds. HTN: BP is well controlled today, continue benazepril, Lasix, hydralazine, metoprolol.  Check BMP CBC High cholesterol: On Lipitor, labs. SCC, R parotid gland: See last visit, chart is reviewed, neck mass ended up being metastatic disease, had surgery 06-2021. To start XRT, no chemo Preventive care: Rec COVID-vaccine #4 flu shot this fall. RTC 4 months CPX    This visit occurred during the SARS-CoV-2 public health emergency.  Safety protocols were in place, including screening questions prior to the visit, additional usage of staff PPE, and extensive cleaning of exam room while observing appropriate contact time as indicated for disinfecting solutions.

## 2021-07-09 NOTE — Patient Instructions (Addendum)
Per our records you are due for your diabetic eye exam. Please contact your eye doctor to schedule an appointment. Please have them send copies of your office visit notes to [place]. Our fax number is [phone]. If you need a referral to an eye doctor please let us know.   Recommend to proceed with your COVID-vaccine #4, you can get it t today at the first floor of our building at the pharmacy    [facility] : Get the blood work     [place], [place] back for a physical exam in 4 months

## 2021-07-09 NOTE — Progress Notes (Signed)
   Covid-19 Vaccination Clinic  Name:  [name]    MRN: [id number] DOB: [birth year]  07/09/2021  [name] was observed post Covid-19 immunization for 15 minutes without incident. He was provided with Vaccine Information Sheet and instruction to access the V-Safe system.   [name] was instructed to call 911 with any severe reactions post vaccine: Difficulty breathing  Swelling of face and throat  A fast heartbeat  A bad rash all over body  Dizziness and weakness   Immunizations Administered     Name Date Dose VIS Date Route   PFIZER Comrnaty(Gray TOP) Covid-19 Vaccine 07/09/2021 11:31 AM 0.3 mL 11/16/2020 Intramuscular   Manufacturer: [place]   Lot: [id number]   NDC: 407 724 2011

## 2021-07-10 ENCOUNTER — Ambulatory Visit (INDEPENDENT_AMBULATORY_CARE_PROVIDER_SITE_OTHER): Payer: Medicare Other | Admitting: Sports Medicine

## 2021-07-10 ENCOUNTER — Ambulatory Visit (INDEPENDENT_AMBULATORY_CARE_PROVIDER_SITE_OTHER): Payer: Medicare Other

## 2021-07-10 ENCOUNTER — Telehealth: Payer: Self-pay | Admitting: Sports Medicine

## 2021-07-10 DIAGNOSIS — G8929 Other chronic pain: Secondary | ICD-10-CM

## 2021-07-10 DIAGNOSIS — M1711 Unilateral primary osteoarthritis, right knee: Secondary | ICD-10-CM

## 2021-07-10 DIAGNOSIS — Z09 Encounter for follow-up examination after completed treatment for conditions other than malignant neoplasm: Secondary | ICD-10-CM

## 2021-07-10 DIAGNOSIS — M25461 Effusion, right knee: Secondary | ICD-10-CM | POA: Diagnosis not present

## 2021-07-10 DIAGNOSIS — M25561 Pain in right knee: Secondary | ICD-10-CM | POA: Diagnosis not present

## 2021-07-10 NOTE — Telephone Encounter (Signed)
[name] has x-rays confirm knee osteoarthritis, and has not responded sufficiently well to steroid injections, oral NSAIDs, please get him approved for either Orthovisc or see if a different viscosupplementation is preferred.  Right knee only.

## 2021-07-10 NOTE — Assessment & Plan Note (Signed)
DM: So far diet controlled, last A1c was 6.9, rechecking today.  Consider meds. HTN: BP is well controlled today, continue benazepril, Lasix, hydralazine, metoprolol.  Check BMP CBC High cholesterol: On Lipitor, labs. SCC, R parotid gland: See last visit, chart is reviewed, neck mass ended up being metastatic disease, had surgery 06-2021. To start XRT, no chemo Preventive care: Rec COVID-vaccine #4 flu shot this fall. RTC 4 months CPX

## 2021-07-10 NOTE — Assessment & Plan Note (Signed)
This is a pleasant 87 year old male, has a long history of knee pain, known osteoarthritis, exacerbation of a chronic process with pharmacologic intervention, knee was injected today, we will get him approved for viscosupplementation, he did this about 10 years ago and had good relief. Has tried oral NSAIDs and analgesics without much improvement. Return to see me to start viscosupplementation once we get the approval.

## 2021-07-10 NOTE — Progress Notes (Signed)
    Procedures performed today:    Procedure: Real-time Ultrasound Guided injection of the right knee Device: Samsung HS60  Verbal informed consent obtained.  Time-out conducted.  Noted no overlying erythema, induration, or other signs of local infection.  Skin prepped in a sterile fashion.  Local anesthesia: Topical Ethyl chloride.  With sterile technique and under real time ultrasound guidance: Noted trace effusion, 1 cc Kenalog 40, 2 cc lidocaine, 2 cc bupivacaine injected easily Completed without difficulty  Advised to call if fevers/chills, erythema, induration, drainage, or persistent bleeding.  Images permanently stored and available for review in PACS.  Impression: Technically successful ultrasound guided injection.   Independent interpretation of notes and tests performed by another provider:   None.  Brief History, Exam, Impression, and Recommendations:    Primary osteoarthritis of right knee This is a pleasant 87 year old male, has a long history of knee pain, known osteoarthritis, exacerbation of a chronic process with pharmacologic intervention, knee was injected today, we will get him approved for viscosupplementation, he did this about 10 years ago and had good relief. Has tried oral NSAIDs and analgesics without much improvement. Return to see me to start viscosupplementation once we get the approval.    ___________________________________________ Gwen Her. Dianah Field, M.D., ABFM., CAQSM. Primary Care and [place] Instructor of [place] [employer] [facility] [employer] [facility]

## 2021-07-12 ENCOUNTER — Other Ambulatory Visit (HOSPITAL_BASED_OUTPATIENT_CLINIC_OR_DEPARTMENT_OTHER): Payer: Self-pay

## 2021-07-12 MED ORDER — COVID-19 MRNA VAC-TRIS(PFIZER) 30 MCG/0.3ML IM SUSP
INTRAMUSCULAR | 0 refills | Status: DC
  Filled 2021-07-12: qty 0.3, 1d supply, fill #0

## 2021-07-12 MED ORDER — DAPAGLIFLOZIN PROPANEDIOL 5 MG PO TABS: 5.0000 mg | ORAL_TABLET | Freq: Every day | ORAL | 1 refills | Status: DC

## 2021-07-12 NOTE — Addendum Note (Signed)
Addended byDamita Dunnings D on: 07/12/2021 12:57 PM   Modules accepted: Orders

## 2021-07-13 ENCOUNTER — Encounter: Payer: Self-pay | Admitting: Internal Medicine

## 2021-07-13 DIAGNOSIS — C44320 Squamous cell carcinoma of skin of unspecified parts of face: Secondary | ICD-10-CM | POA: Diagnosis not present

## 2021-07-13 MED ORDER — EMPAGLIFLOZIN 10 MG PO TABS: 10.0000 mg | ORAL_TABLET | Freq: Every day | ORAL | 1 refills | Status: DC

## 2021-07-13 NOTE — Progress Notes (Signed)
HPI: FU coronary artery disease and atrial fibrillation. Patient is status post coronary artery bypassing graft in 1993 with a LIMA to the LAD, saphenous vein graft to the ramus intermedius and saphenous vein graft to the first diagonal. CT of the abdomen in January of 2009 showed no renal artery stenosis. There was no aortic aneurysm. Nuclear study in June 2014 showed scar in the mid/apical inferior wall and apex. There was a small area of ischemia in the base to mid anterior wall. The study was unchanged compared to May of 2012. The study was not gated. We have treated medically. Echo 12/17 showed EF 40-45, mild MR, severe LAE, moderate RAE, mild RVE, mild TR. Since last seen, patient denies dyspnea, chest pain, palpitations or syncope.  Occasional mild pedal edema.  Patient does state that his blood pressure has been running low since [place] was initiated.  Current Outpatient Medications  Medication Sig Dispense Refill   atorvastatin (LIPITOR) 80 MG tablet Take 0.5 tablets (40 mg total) by mouth daily.     benazepril (LOTENSIN) 20 MG tablet Take 1 tablet (20 mg total) by mouth daily. 90 tablet 1   Coenzyme Q10 (COQ10) 100 MG CAPS Take 1 capsule by mouth daily.      dapagliflozin propanediol (FARXIGA) 5 MG TABS tablet Take 5 mg by mouth daily.     diphenhydramine-acetaminophen (TYLENOL PM) 25-500 MG TABS tablet Take 2 tablets by mouth at bedtime.     furosemide (LASIX) 40 MG tablet Take 1 tablet (40 mg total) by mouth daily. 90 tablet 1   Melatonin 10 MG TABS Take 10 mg by mouth at bedtime.      metoprolol succinate (TOPROL-XL) 25 MG 24 hr tablet Take 1 tablet (25 mg total) by mouth daily. Take with or immediately following a meal 90 tablet 1   Multiple Vitamin (MULTIVITAMIN WITH MINERALS) TABS tablet Take 1 tablet by mouth daily.     pantoprazole (PROTONIX) 40 MG tablet Take 1 tablet (40 mg total) by mouth daily. 90 tablet 3   Rivaroxaban (XARELTO) 15 MG TABS tablet Take 1 tablet (15 mg  total) by mouth daily with supper. 90 tablet 1   tamsulosin (FLOMAX) 0.4 MG CAPS capsule TAKE 1 CAPSULE(0.4 MG) BY MOUTH DAILY 90 capsule 3   hydrALAZINE (APRESOLINE) 25 MG tablet Take 1 tablet (25 mg total) by mouth 3 (three) times daily. 270 tablet 3   No current facility-administered medications for this visit.     Past Medical History:  Diagnosis Date   Anemia    Antral gastritis 2013   EGD    Atrial fibrillation ([place])    CAD (coronary artery disease)    s/p CABG 1993 (L-LAD, S-RI, S-D1);   echo 1/10: EF 55%;    myoview 12/09: inf MI, no ischemia   Candida esophagitis ([place]) 2013   EGD    Cataracts, bilateral    Family history of malignant neoplasm of gastrointestinal tract    Folliculitis    Hiatal hernia    HTN (hypertension)    Hyperlipidemia    Irritable bladder    Myocardial infarction [facility])    Obesity    Osteoarthritis     Past Surgical History:  Procedure Laterality Date   CATARACT EXTRACTION Right 07/21/2018   CHOLECYSTECTOMY     laparoscopic '92   CORONARY ARTERY BYPASS GRAFT     graft '92: LIMA-LAD, SVG - D2,R1, D1   ENTEROSCOPY  08/22/2012   Procedure: ENTEROSCOPY;  Surgeon: Mar Daring  Collene Mares, MD;  Location: [facility];  Service: Endoscopy;  Laterality: N/A;   INTRAOCULAR LENS EXCHANGE Right 07/21/2018   KNEE SURGERY     MOLE REMOVAL  2001 and 2008   PAROTIDECTOMY Right 06/18/2021   PILONIDAL CYST EXCISION      Social History   Socioeconomic History   Marital status: Widowed    Spouse name: Not on file   Number of children: 1   Years of education: Not on file   Highest education level: Not on file  Occupational History   Occupation: Retired    Fish farm manager: RETIRED  Tobacco Use   Smoking status: Former    Years: 16.00    Types: Cigarettes    Quit date: 08/14/1964    Years since quitting: 56.9   Smokeless tobacco: Never   Tobacco comments:    quit 1965  Vaping Use   Vaping Use: Never used  Substance and Sexual Activity   Alcohol use: Yes     Alcohol/week: 7.0 standard drinks    Types: 7 Shots of liquor per week    Comment: brandy at night   Drug use: No   Sexual activity: Not on file  Other Topics Concern   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 Married '57- widowed March 08, 2023. 1 son -'27- died '58 MVA.    Lives w/ girlfriend     Emergency contact: see DPR, brother and  Daron Offer [phone] (friend)   Social Determinants of Health   Financial Resource Strain: Low Risk    Difficulty of Paying Living Expenses: Not hard at all  Food Insecurity: Not on file  Transportation Needs: Not on file  Physical Activity: Not on file  Stress: Not on file  Social Connections: Not on file  Intimate Partner Violence: Not on file    Family History  Problem Relation Age of Onset   Coronary artery disease Father        and brother   Heart attack Father        and brother-fatal   Stroke Father    Breast cancer Mother    Alzheimer's disease Mother    Colon cancer Maternal Uncle    Esophageal cancer Neg Hx    Stomach cancer Neg Hx    Rectal cancer Neg Hx    Prostate cancer Neg Hx     ROS: no fevers or chills, productive cough, hemoptysis, dysphasia, odynophagia, melena, hematochezia, dysuria, hematuria, rash, seizure activity, orthopnea, PND, pedal edema, claudication. Remaining systems are negative.  Physical Exam: Well-developed well-nourished in no acute distress.  Skin is warm and dry.  HEENT is normal.  Neck is supple.  Chest is clear to auscultation with normal expansion.  Cardiovascular exam is irregular Abdominal exam nontender or distended. No masses palpated. Extremities show no edema. neuro grossly intact  ECG-atrial fibrillation at a rate of 52, no ST changes.  Personally reviewed  A/P  1 permanent atrial fibrillation-plan to continue beta-blocker for rate control.  Continue Xarelto.  2 hypertension-blood pressure controlled.  However patient states that since starting  [place] his blood pressure has been running low.  Discontinue hydralazine and follow.  3 hyperlipidemia-continue statin.  4 coronary artery disease-patient denies chest pain.  Continue statin.  He is not on aspirin given need for anticoagulation.  5 chronic combined systolic/diastolic congestive heart failure-he is euvolemic on examination.  Continue diuretic at present dose. We will repeat echocardiogram to reassess LV  function.  Kirk Ruths, MD

## 2021-07-13 NOTE — Telephone Encounter (Signed)
Pt unable to afford [place]. Please advise.

## 2021-07-16 ENCOUNTER — Telehealth: Payer: Self-pay

## 2021-07-16 NOTE — Telephone Encounter (Signed)
Pt's friend, Shirlee Limerick, called stating pt had worked out and was experiencing what she thinks could be a reaction to a new diabetic medication pt is taking.  Pt's BP is running 95/40's.  Shirlee Limerick was transferred to Triage for further instructions.

## 2021-07-16 NOTE — Telephone Encounter (Signed)
Noted, thx.

## 2021-07-16 NOTE — Telephone Encounter (Signed)
Nurse Assessment Nurse: Zorita Pang, RN, Deborah Date/Time (Eastern Time): 07/16/2021 3:33:46 PM Confirm and document reason for call. If symptomatic, describe symptoms. ---The caller states that [place] felt dizzy after exercising. She gave him two small pieces of candy. Just started on Farxiga. Does the patient have any new or worsening symptoms? ---Yes Will a triage be completed? ---Yes Related visit to physician within the last 2 weeks? ---No Does the PT have any chronic conditions? (i.e. diabetes, asthma, this includes High risk factors for pregnancy, etc.) ---Yes List chronic conditions. ---diabetes, A fib and takes Xarelto, hyperlipidemia on meds, hypertension Is this a behavioral health or substance abuse call? ---No Guidelines Guideline Title Affirmed Question Affirmed Notes Nurse Date/Time (Eastern Time) Dizziness - Lightheadedness [1] MODERATE dizziness (e.g., interferes with normal activities) AND [2] has NOT Zorita Pang, RN, Neoma Laming 07/16/2021 3:42:37 PM PLEASE NOTE: All timestamps contained within this report are represented as [place] Standard Time. CONFIDENTIALTY NOTICE: This fax transmission is intended only for the addressee. It contains information that is legally privileged, confidential or otherwise protected from use or disclosure. If you are not the intended recipient, you are strictly prohibited from reviewing, disclosing, copying using or disseminating any of this information or taking any action in reliance on or regarding this information. If you have received this fax in error, please notify us immediately by telephone so that we can arrange for its return to [place]. Phone: [phone], Toll-Free: [phone], Fax: [phone] Page: 2 of 2 Call Id: [id number] Guidelines Guideline Title Affirmed Question Affirmed Notes Nurse Date/Time Eilene Ghazi Time) been evaluated by physician for this (Exception: dizziness caused by heat exposure, sudden standing, or poor fluid  intake) Disp. Time Eilene Ghazi Time) Disposition Final User 07/16/2021 3:49:34 PM See PCP within 24 Hours Yes Zorita Pang, RN, Garrel Ridgel Disagree/Comply Comply Caller Understands Yes PreDisposition Call Doctor Care Advice Given Per Guideline SEE PCP WITHIN 24 HOURS: * Drink several glasses of fruit juice, other clear fluids or water. * Lie down with feet elevated for 1 hour. * Passes out (faints) Comments User: Marquis Buggy, RN Date/Time Eilene Ghazi Time): 07/16/2021 3:53:26 PM The caller started on Farxiga yesterday. This morning had a bowl of cereal with a half of banana. Then cut up a watermelon and ate a lot of that. Then worked out twice with lifting weights. States that he got lightheaded and his vision was blurry. His SO checked his BP and it was 90/40 and currently is 117/54. Ate half a sandwich for lunch. Does not check glucose at home. His SO had him eat 2 pieces of candy and that helped his blurred vision. This nurse instructed him to be seen by his PCP in 24 hours, to drink 2-3 glasses of water, and to lie down for 1 hour with his feet elevated and to call back if he has another episode of hypotension or feeling faint and he verbalized understanding. Referrals REFERRED TO PCP OFFICE    Pt scheduled tomorrow, 8/9 w/ PCP.

## 2021-07-17 ENCOUNTER — Encounter: Payer: Self-pay | Admitting: Internal Medicine

## 2021-07-17 ENCOUNTER — Ambulatory Visit (INDEPENDENT_AMBULATORY_CARE_PROVIDER_SITE_OTHER): Payer: Medicare Other | Admitting: Internal Medicine

## 2021-07-17 ENCOUNTER — Other Ambulatory Visit: Payer: Self-pay

## 2021-07-17 VITALS — BP 98/55 | HR 65 | Temp 98.1°F | Resp 16 | Ht 71.0 in | Wt 222.5 lb

## 2021-07-17 DIAGNOSIS — E1159 Type 2 diabetes mellitus with other circulatory complications: Secondary | ICD-10-CM

## 2021-07-17 DIAGNOSIS — I10 Essential (primary) hypertension: Secondary | ICD-10-CM

## 2021-07-17 DIAGNOSIS — R42 Dizziness and giddiness: Secondary | ICD-10-CM

## 2021-07-17 LAB — POCT GLUCOSE (DEVICE FOR HOME USE): POC Glucose: 142 mg/dl — AB (ref 70–99)

## 2021-07-17 MED ORDER — BENAZEPRIL HCL 20 MG PO TABS: 20.0000 mg | ORAL_TABLET | Freq: Every day | ORAL | 1 refills | Status: DC

## 2021-07-17 NOTE — Patient Instructions (Addendum)
Decrease benazepril from 40 mg to 20 mg. Continue Farxiga and the rest of the medications.   Check the  blood pressure daily for a while BP GOAL is between 110/65 and  135/85. If it is consistently higher or lower, let me know  Drink plenty of fluids  Definitely call if you feel lightheaded

## 2021-07-17 NOTE — Progress Notes (Signed)
Subjective:    Patient ID: [name], male    DOB: Oct 05, 1931, 87 y.o.   MRN: [id number]  DOS:  07/17/2021 Type of visit - description: Acute Was prescribed [name], took his first dose 07/15/2021, then went to exercise and shortly after got lightheaded. BP was low in the 90s/40s, he ate, and check his BP again and it was better. Since then, he is taking [place] daily and has no problems. BP noted to be low today.    Review of Systems Denies chest pain, difficulty breathing. Again he is feeling well  Past Medical History:  Diagnosis Date   Anemia    Antral gastritis 2013   EGD    Atrial fibrillation ([place])    CAD (coronary artery disease)    s/p CABG 1993 (L-LAD, S-RI, S-D1);   echo 1/10: EF 55%;    myoview 12/09: inf MI, no ischemia   Candida esophagitis ([place]) 2013   EGD    Cataracts, bilateral    Family history of malignant neoplasm of gastrointestinal tract    Folliculitis    Hiatal hernia    HTN (hypertension)    Hyperlipidemia    Irritable bladder    Myocardial infarction [facility])    Obesity    Osteoarthritis     Past Surgical History:  Procedure Laterality Date   CATARACT EXTRACTION Right 07/21/2018   CHOLECYSTECTOMY     laparoscopic '92   CORONARY ARTERY BYPASS GRAFT     graft '92: LIMA-LAD, SVG - D2,R1, D1   ENTEROSCOPY  08/22/2012   Procedure: ENTEROSCOPY;  Surgeon: Juanita Craver, MD;  Location: [facility];  Service: Endoscopy;  Laterality: N/A;   INTRAOCULAR LENS EXCHANGE Right 07/21/2018   KNEE SURGERY     MOLE REMOVAL  2001 and 2008   PAROTIDECTOMY Right 06/18/2021   PILONIDAL CYST EXCISION      Allergies as of 07/17/2021   No Known Allergies      Medication List        Accurate as of July 17, 2021 11:59 PM. If you have any questions, ask your nurse or doctor.          STOP taking these medications    empagliflozin 10 MG Tabs tablet Commonly known as: Jardiance Stopped by: Kathlene November, MD   Jardiance 10 MG Tabs tablet Generic drug:  empagliflozin Stopped by: Kathlene November, MD   Pfizer-BioNT COVID-19 Vac-TriS Susp injection Generic drug: COVID-19 mRNA Vac-TriS Therapist, music) Stopped by: Kathlene November, MD       TAKE these medications    atorvastatin 80 MG tablet Commonly known as: LIPITOR Take 0.5 tablets (40 mg total) by mouth daily.   benazepril 20 MG tablet Commonly known as: LOTENSIN Take 1 tablet (20 mg total) by mouth daily. What changed:  medication strength how much to take Changed by: Kathlene November, MD   CoQ10 100 MG Caps Take 1 capsule by mouth daily.   dapagliflozin propanediol 5 MG Tabs tablet Commonly known as: FARXIGA Take 5 mg by mouth daily.   diphenhydramine-acetaminophen 25-500 MG Tabs tablet Commonly known as: TYLENOL PM Take 2 tablets by mouth at bedtime.   furosemide 40 MG tablet Commonly known as: LASIX Take 1 tablet (40 mg total) by mouth daily.   hydrALAZINE 25 MG tablet Commonly known as: APRESOLINE Take 1 tablet (25 mg total) by mouth 3 (three) times daily.   Melatonin 10 MG Tabs Take 10 mg by mouth at bedtime.   metoprolol succinate 25 MG 24 hr tablet Commonly  known as: TOPROL-XL Take 1 tablet (25 mg total) by mouth daily. Take with or immediately following a meal   multivitamin with minerals Tabs tablet Take 1 tablet by mouth daily.   pantoprazole 40 MG tablet Commonly known as: PROTONIX Take 1 tablet (40 mg total) by mouth daily.   Rivaroxaban 15 MG Tabs tablet Commonly known as: Xarelto Take 1 tablet (15 mg total) by mouth daily with supper.   tamsulosin 0.4 MG Caps capsule Commonly known as: FLOMAX TAKE 1 CAPSULE(0.4 MG) BY MOUTH DAILY           Objective:   Physical Exam BP (!) 98/55 (BP Location: Left Arm, Patient Position: Sitting, Cuff Size: Normal)   Pulse 65   Temp 98.1 F (36.7 C) (Oral)   Resp 16   Ht '5\' 11"'$  (1.803 m)   Wt 222 lb 8 oz (100.9 kg)   SpO2 98%   BMI 31.03 kg/m  General:   Well developed, NAD, BMI noted.  HEENT:  Normocephalic . Face  symmetric, surgical scar with no redness or discharge. Lungs:  CTA B Normal respiratory effort, no intercostal retractions, no accessory muscle use. Heart: Bradycardic  Lower extremities: Trace pretibial edema L>R Neurologic:  alert & oriented X3.  Speech normal, gait appropriate for age and unassisted.   Psych--  Cognition and judgment appear intact.  Cooperative with normal attention span and concentration.  Behavior appropriate. No anxious or depressed appearing.    Assessment     Assessment  DM: diet control, no neuropathy HTN Hyperlipidemia CRI  CV: --CAD, CABG 1993, Myoview 2009 no ischemia. --Atrial fibrillation -- rate control, xarelto -- chronic combined systolic/diastolic congestive heart failure Venous insufficiency, mild LE edema L>>R  LUTS    MSK: DJD, spinal stenosis, R wrist Fx in the 80s GI: --Recurrent melena: Work-up 2013 Dr. Sharlett Iles: Colonoscopy, EGD, capsule endoscopy.  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  He took the first dose 2 days ago, felt lightheaded, BP dropped. Since then he continue taking it, feeling well.  BP today remains slightly low. CBG today: 142 I suspect [name] has decreased his blood pressure to some degree. Recommend to decrease benazepril from 40 mg to 20 mg and monitor BPs Continue Farxiga, cost is an issue, explained patient that this medication has multiple benefits for him however if cost is still a problem I am willing to try something else.  He will let me know HTN: See above, decrease benazepril.       This visit occurred during the SARS-CoV-2 public health emergency.  Safety protocols were in place, including screening questions prior to the visit, additional usage of staff PPE, and extensive cleaning of exam room  while observing appropriate contact time as indicated for disinfecting solutions.

## 2021-07-18 ENCOUNTER — Encounter: Payer: Self-pay | Admitting: Cardiology

## 2021-07-18 ENCOUNTER — Ambulatory Visit (INDEPENDENT_AMBULATORY_CARE_PROVIDER_SITE_OTHER): Payer: Medicare Other | Admitting: Cardiology

## 2021-07-18 VITALS — BP 125/70 | HR 52 | Ht 71.0 in | Wt 217.1 lb

## 2021-07-18 DIAGNOSIS — I10 Essential (primary) hypertension: Secondary | ICD-10-CM

## 2021-07-18 DIAGNOSIS — I2510 Atherosclerotic heart disease of native coronary artery without angina pectoris: Secondary | ICD-10-CM | POA: Diagnosis not present

## 2021-07-18 DIAGNOSIS — E7800 Pure hypercholesterolemia, unspecified: Secondary | ICD-10-CM

## 2021-07-18 DIAGNOSIS — I4821 Permanent atrial fibrillation: Secondary | ICD-10-CM

## 2021-07-18 DIAGNOSIS — I5042 Chronic combined systolic (congestive) and diastolic (congestive) heart failure: Secondary | ICD-10-CM

## 2021-07-18 NOTE — Telephone Encounter (Signed)
Cindy, we have not had the ability to get together for [employer]ov[employer] PA stuff. Can you help with this until we get together?

## 2021-07-18 NOTE — Patient Instructions (Signed)
Medication Instructions:   STOP HYDRALAZINE  *If you need a refill on your cardiac medications before your next appointment, please call your pharmacy*   Testing/Procedures:  Your physician has requested that you have an echocardiogram. Echocardiography is a painless test that uses sound waves to create images of your heart. It provides your doctor with information about the size and shape of your heart and how well your heart's chambers and valves are working. This procedure takes approximately one hour. There are no restrictions for this procedure. [place]   Follow-Up: At [facility], you and your health needs are our priority.  As part of our continuing mission to provide you with exceptional heart care, we have created designated Provider Care Teams.  These Care Teams include your primary Cardiologist (physician) and Advanced Practice Providers (APPs -  Physician Assistants and Nurse Practitioners) who all work together to provide you with the care you need, when you need it.  We recommend signing up for the patient portal called "MyChart".  Sign up information is provided on this After Visit Summary.  MyChart is used to connect with patients for Virtual Visits (Telemedicine).  Patients are able to view lab/test results, encounter notes, upcoming appointments, etc.  Non-urgent messages can be sent to your provider as well.   To learn more about what you can do with MyChart, go to [email or web address].    Your next appointment:   12 month(s)  The format for your next appointment:   In Person  Provider:   Kirk Ruths, MD

## 2021-07-18 NOTE — Assessment & Plan Note (Signed)
DM: Last A1c 7.2, I elected to start him on medication, was Rx [place].  He took the first dose 2 days ago, felt lightheaded, BP dropped. Since then he continue taking it, feeling well.  BP today remains slightly low. CBG today: 142 I suspect [name] has decreased his blood pressure to some degree. Recommend to decrease benazepril from 40 mg to 20 mg and monitor BPs Continue Farxiga, cost is an issue, explained patient that this medication has multiple benefits for him however if cost is still a problem I am willing to try something else.  He will let me know HTN: See above, decrease benazepril.

## 2021-07-20 ENCOUNTER — Telehealth: Payer: Self-pay

## 2021-07-20 NOTE — Telephone Encounter (Signed)
Advise patient's friend: I again recommend ER if he continue to feel unwell.  Otherwise arrange for a visit for early next week. Marland Kitchen

## 2021-07-20 NOTE — Telephone Encounter (Signed)
Patient's [name], called the office back stating patient is refusing to go to the hospital.  EMS came and apparently moved patient to the floor, from what I could gather from [place] and she stated patient did not want to be transported and he only needs a muscle relaxer at this point.

## 2021-07-20 NOTE — Telephone Encounter (Signed)
Tried calling Shirlee Limerick back- no answer, unable to leave voice message. Will try again later.

## 2021-07-20 NOTE — Telephone Encounter (Signed)
Tried calling Shirlee Limerick- again no answer, unable to leave message.

## 2021-07-20 NOTE — Telephone Encounter (Signed)
Patients friend Shirlee Limerick) called stating Mr Grounds is "frozen in place" on a bar stool x 3.5 hours.  She reports when patient attempts to move, he has severe stabbing abdominal pain.  Pain has not been relieved with heat, Tylenol or Salonpas patch.  Due to severe pain with movement, advised Shirlee Limerick to call EMS. Patient is hesitant to call, however Shirlee Limerick agrees with plan.  Requested call back with any additional concerns.

## 2021-07-23 DIAGNOSIS — M9905 Segmental and somatic dysfunction of pelvic region: Secondary | ICD-10-CM | POA: Diagnosis not present

## 2021-07-23 DIAGNOSIS — M461 Sacroiliitis, not elsewhere classified: Secondary | ICD-10-CM | POA: Diagnosis not present

## 2021-07-23 DIAGNOSIS — M5451 Vertebrogenic low back pain: Secondary | ICD-10-CM | POA: Diagnosis not present

## 2021-07-23 DIAGNOSIS — M9904 Segmental and somatic dysfunction of sacral region: Secondary | ICD-10-CM | POA: Diagnosis not present

## 2021-07-23 DIAGNOSIS — M9903 Segmental and somatic dysfunction of lumbar region: Secondary | ICD-10-CM | POA: Diagnosis not present

## 2021-07-23 DIAGNOSIS — M546 Pain in thoracic spine: Secondary | ICD-10-CM | POA: Diagnosis not present

## 2021-07-23 DIAGNOSIS — M9902 Segmental and somatic dysfunction of thoracic region: Secondary | ICD-10-CM | POA: Diagnosis not present

## 2021-07-23 DIAGNOSIS — M25552 Pain in left hip: Secondary | ICD-10-CM | POA: Diagnosis not present

## 2021-07-24 DIAGNOSIS — H02834 Dermatochalasis of left upper eyelid: Secondary | ICD-10-CM | POA: Diagnosis not present

## 2021-07-24 DIAGNOSIS — M461 Sacroiliitis, not elsewhere classified: Secondary | ICD-10-CM | POA: Diagnosis not present

## 2021-07-24 DIAGNOSIS — H02105 Unspecified ectropion of left lower eyelid: Secondary | ICD-10-CM | POA: Diagnosis not present

## 2021-07-24 DIAGNOSIS — M9904 Segmental and somatic dysfunction of sacral region: Secondary | ICD-10-CM | POA: Diagnosis not present

## 2021-07-24 DIAGNOSIS — M5451 Vertebrogenic low back pain: Secondary | ICD-10-CM | POA: Diagnosis not present

## 2021-07-24 DIAGNOSIS — H02831 Dermatochalasis of right upper eyelid: Secondary | ICD-10-CM | POA: Diagnosis not present

## 2021-07-24 DIAGNOSIS — M9905 Segmental and somatic dysfunction of pelvic region: Secondary | ICD-10-CM | POA: Diagnosis not present

## 2021-07-24 DIAGNOSIS — H0100B Unspecified blepharitis left eye, upper and lower eyelids: Secondary | ICD-10-CM | POA: Diagnosis not present

## 2021-07-24 DIAGNOSIS — H43813 Vitreous degeneration, bilateral: Secondary | ICD-10-CM | POA: Diagnosis not present

## 2021-07-24 DIAGNOSIS — H26493 Other secondary cataract, bilateral: Secondary | ICD-10-CM | POA: Diagnosis not present

## 2021-07-24 DIAGNOSIS — M546 Pain in thoracic spine: Secondary | ICD-10-CM | POA: Diagnosis not present

## 2021-07-24 DIAGNOSIS — M25552 Pain in left hip: Secondary | ICD-10-CM | POA: Diagnosis not present

## 2021-07-24 DIAGNOSIS — Z961 Presence of intraocular lens: Secondary | ICD-10-CM | POA: Diagnosis not present

## 2021-07-24 DIAGNOSIS — E119 Type 2 diabetes mellitus without complications: Secondary | ICD-10-CM | POA: Diagnosis not present

## 2021-07-24 DIAGNOSIS — H0100A Unspecified blepharitis right eye, upper and lower eyelids: Secondary | ICD-10-CM | POA: Diagnosis not present

## 2021-07-24 DIAGNOSIS — H527 Unspecified disorder of refraction: Secondary | ICD-10-CM | POA: Diagnosis not present

## 2021-07-24 DIAGNOSIS — M9903 Segmental and somatic dysfunction of lumbar region: Secondary | ICD-10-CM | POA: Diagnosis not present

## 2021-07-24 DIAGNOSIS — H02403 Unspecified ptosis of bilateral eyelids: Secondary | ICD-10-CM | POA: Diagnosis not present

## 2021-07-24 DIAGNOSIS — H35033 Hypertensive retinopathy, bilateral: Secondary | ICD-10-CM | POA: Diagnosis not present

## 2021-07-24 LAB — HM DIABETES EYE EXAM

## 2021-07-25 DIAGNOSIS — M25552 Pain in left hip: Secondary | ICD-10-CM | POA: Diagnosis not present

## 2021-07-25 DIAGNOSIS — M9904 Segmental and somatic dysfunction of sacral region: Secondary | ICD-10-CM | POA: Diagnosis not present

## 2021-07-25 DIAGNOSIS — M9905 Segmental and somatic dysfunction of pelvic region: Secondary | ICD-10-CM | POA: Diagnosis not present

## 2021-07-25 DIAGNOSIS — M9903 Segmental and somatic dysfunction of lumbar region: Secondary | ICD-10-CM | POA: Diagnosis not present

## 2021-07-25 DIAGNOSIS — M5451 Vertebrogenic low back pain: Secondary | ICD-10-CM | POA: Diagnosis not present

## 2021-07-25 NOTE — Addendum Note (Signed)
Addended by: Jacqulynn Cadet on: 07/25/2021 09:39 AM   Modules accepted: Orders

## 2021-07-26 DIAGNOSIS — C44320 Squamous cell carcinoma of skin of unspecified parts of face: Secondary | ICD-10-CM | POA: Diagnosis not present

## 2021-07-27 DIAGNOSIS — M9903 Segmental and somatic dysfunction of lumbar region: Secondary | ICD-10-CM | POA: Diagnosis not present

## 2021-07-27 DIAGNOSIS — Z510 Encounter for antineoplastic radiation therapy: Secondary | ICD-10-CM | POA: Diagnosis not present

## 2021-07-27 DIAGNOSIS — M9904 Segmental and somatic dysfunction of sacral region: Secondary | ICD-10-CM | POA: Diagnosis not present

## 2021-07-27 DIAGNOSIS — M25552 Pain in left hip: Secondary | ICD-10-CM | POA: Diagnosis not present

## 2021-07-27 DIAGNOSIS — M9905 Segmental and somatic dysfunction of pelvic region: Secondary | ICD-10-CM | POA: Diagnosis not present

## 2021-07-27 DIAGNOSIS — C44320 Squamous cell carcinoma of skin of unspecified parts of face: Secondary | ICD-10-CM | POA: Diagnosis not present

## 2021-07-27 DIAGNOSIS — M5451 Vertebrogenic low back pain: Secondary | ICD-10-CM | POA: Diagnosis not present

## 2021-08-01 ENCOUNTER — Ambulatory Visit (INDEPENDENT_AMBULATORY_CARE_PROVIDER_SITE_OTHER): Payer: Medicare Other

## 2021-08-01 VITALS — Ht 71.0 in | Wt 217.0 lb

## 2021-08-01 DIAGNOSIS — Z0000 Encounter for general adult medical examination without abnormal findings: Secondary | ICD-10-CM

## 2021-08-01 NOTE — Progress Notes (Addendum)
Subjective:   [name] is a 87 y.o. male who presents for Medicare Annual/Subsequent preventive examination.  I connected with [name] today by telephone and verified that I am speaking with the correct person using two identifiers. Location patient: home Location provider: work Persons participating in the virtual visit: patient, Marine scientist.    I discussed the limitations, risks, security and privacy concerns of performing an evaluation and management service by telephone and the availability of in person appointments. I also discussed with the patient that there may be a patient responsible charge related to this service. The patient expressed understanding and verbally consented to this telephonic visit.    Interactive audio and video telecommunications were attempted between this provider and patient, however failed, due to patient having technical difficulties OR patient did not have access to video capability.  We continued and completed visit with audio only.  Some vital signs may be absent or patient reported.   Time Spent with patient on telephone encounter: 20 minutes   Review of Systems     Cardiac Risk Factors include: male gender;advanced age (>32mn, >>57women);diabetes mellitus;dyslipidemia;hypertension     Objective:    Today's Vitals   08/01/21 1541  Weight: 217 lb (98.4 kg)  Height: '5\' 11"'$  (1.803 m)   Body mass index is 30.27 kg/m.  Advanced Directives 08/01/2021 04/22/2020 04/10/2020 02/14/2015 08/22/2014 08/20/2012 08/14/2012  Does Patient Have a Medical Advance Directive? Yes No No No No Patient does not have advance directive;Patient would not like information Patient does not have advance directive;Patient would not like information  Type of AScientist, forensicPower of [place] will - - - - - -  Copy of [place]? No - copy requested - - - - - -  Would patient like information on creating a medical advance directive? - - No  - Patient declined - No - patient declined information - -  Pre-existing out of facility DNR order (yellow form or pink MOST form) - - - - - No -    Current Medications (verified) Outpatient Encounter Medications as of 08/01/2021  Medication Sig   atorvastatin (LIPITOR) 80 MG tablet Take 0.5 tablets (40 mg total) by mouth daily.   benazepril (LOTENSIN) 20 MG tablet Take 1 tablet (20 mg total) by mouth daily.   Coenzyme Q10 (COQ10) 100 MG CAPS Take 1 capsule by mouth daily.    dapagliflozin propanediol (FARXIGA) 5 MG TABS tablet Take 5 mg by mouth daily.   diphenhydramine-acetaminophen (TYLENOL PM) 25-500 MG TABS tablet Take 2 tablets by mouth at bedtime.   furosemide (LASIX) 40 MG tablet Take 1 tablet (40 mg total) by mouth daily.   Melatonin 10 MG TABS Take 10 mg by mouth at bedtime.    metoprolol succinate (TOPROL-XL) 25 MG 24 hr tablet Take 1 tablet (25 mg total) by mouth daily. Take with or immediately following a meal   Multiple Vitamin (MULTIVITAMIN WITH MINERALS) TABS tablet Take 1 tablet by mouth daily.   pantoprazole (PROTONIX) 40 MG tablet Take 1 tablet (40 mg total) by mouth daily.   Rivaroxaban (XARELTO) 15 MG TABS tablet Take 1 tablet (15 mg total) by mouth daily with supper.   tamsulosin (FLOMAX) 0.4 MG CAPS capsule TAKE 1 CAPSULE(0.4 MG) BY MOUTH DAILY   No facility-administered encounter medications on file as of 08/01/2021.    Allergies (verified) Patient has no known allergies.   History: Past Medical History:  Diagnosis Date   Anemia  Antral gastritis 2013   EGD    Atrial fibrillation (HCC)    CAD (coronary artery disease)    s/p CABG 1993 (L-LAD, S-RI, S-D1);   echo 1/10: EF 55%;    myoview 12/09: inf MI, no ischemia   Candida esophagitis ([place]) 2013   EGD    Cataracts, bilateral    Family history of malignant neoplasm of gastrointestinal tract    Folliculitis    Hiatal hernia    HTN (hypertension)    Hyperlipidemia    Irritable bladder    Myocardial  infarction [facility])    Obesity    Osteoarthritis    Past Surgical History:  Procedure Laterality Date   CATARACT EXTRACTION Right 07/21/2018   CHOLECYSTECTOMY     laparoscopic '92   CORONARY ARTERY BYPASS GRAFT     graft '92: LIMA-LAD, SVG - D2,R1, D1   ENTEROSCOPY  08/22/2012   Procedure: ENTEROSCOPY;  Surgeon: Juanita Craver, MD;  Location: [facility];  Service: Endoscopy;  Laterality: N/A;   INTRAOCULAR LENS EXCHANGE Right 07/21/2018   KNEE SURGERY     MOLE REMOVAL  2001 and 2008   PAROTIDECTOMY Right 06/18/2021   PILONIDAL CYST EXCISION     Family History  Problem Relation Age of Onset   Coronary artery disease Father        and brother   Heart attack Father        and brother-fatal   Stroke Father    Breast cancer Mother    Alzheimer's disease Mother    Colon cancer Maternal Uncle    Esophageal cancer Neg Hx    Stomach cancer Neg Hx    Rectal cancer Neg Hx    Prostate cancer Neg Hx    Social History   Socioeconomic History   Marital status: Widowed    Spouse name: Not on file   Number of children: 1   Years of education: Not on file   Highest education level: Not on file  Occupational History   Occupation: Retired    Fish farm manager: RETIRED  Tobacco Use   Smoking status: Former    Years: 16.00    Types: Cigarettes    Quit date: 08/14/1964    Years since quitting: 57.0   Smokeless tobacco: Never   Tobacco comments:    quit 1965  Vaping Use   Vaping Use: Never used  Substance and Sexual Activity   Alcohol use: Not Currently    Alcohol/week: 7.0 standard drinks    Types: 7 Shots of liquor per week   Drug use: No   Sexual activity: Not on file  Other Topics Concern   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 Married '57- widowed February 13, 2023. 1 son -'38- died '85 MVA.    Lives w/ girlfriend     Emergency contact: see DPR, brother and  Daron Offer [phone] (friend)   Social Determinants of Health   Financial Resource Strain:  Low Risk    Difficulty of Paying Living Expenses: Not hard at all  Food Insecurity: No Food Insecurity   Worried About Charity fundraiser in the Last Year: Never true   Arboriculturist in the Last Year: Never true  Transportation Needs: No Transportation Needs   Lack of Transportation (Medical): No   Lack of Transportation (Non-Medical): No  Physical Activity: Sufficiently Active   Days of Exercise per Week: 6 days   Minutes of Exercise  per Session: 30 min  Stress: No Stress Concern Present   Feeling of Stress : Not at all  Social Connections: Socially Isolated   Frequency of Communication with Friends and Family: More than three times a week   Frequency of Social Gatherings with Friends and Family: More than three times a week   Attends Religious Services: Never   Marine scientist or Organizations: No   Attends Archivist Meetings: Never   Marital Status: Widowed    Tobacco Counseling Counseling given: Not Answered Tobacco comments: quit 1965   Clinical Intake:  Pre-visit preparation completed: Yes  Pain : No/denies pain     Nutritional Status: BMI > 30  Obese Nutritional Risks: None Diabetes: Yes CBG done?: No Did pt. bring in CBG monitor from home?: No (phone visit)  How often do you need to have someone help you when you read instructions, pamphlets, or other written materials from your doctor or pharmacy?: 1 - Never Diabetes:  Is the patient diabetic?  Yes  If diabetic, was a CBG obtained today?  No  Did the patient bring in their glucometer from home?  No phone visit How often do you monitor your CBG's? daily.   Financial Strains and Diabetes Management:  Are you having any financial strains with the device, your supplies or your medication? No .  Does the patient want to be seen by Chronic Care Management for management of their diabetes?  No  Would the patient like to be referred to a Nutritionist or for Diabetic Management?  No    Diabetic Exams:  Diabetic Eye Exam: Completed last week per  patient. Awaiting report  Diabetic Foot Exam: Completed 02/05/2021.   Interpreter Needed?: No  Information entered by :: Caroleen Hamman LPN   Activities of Daily Living In your present state of health, do you have any difficulty performing the following activities: 08/01/2021 07/09/2021  Hearing? N N  Vision? N N  Difficulty concentrating or making decisions? N N  Walking or climbing stairs? N N  Dressing or bathing? N N  Doing errands, shopping? N N  Preparing Food and eating ? N -  Using the Toilet? N -  In the past six months, have you accidently leaked urine? N -  Do you have problems with loss of bowel control? Y -  Managing your Medications? N -  Managing your Finances? N -  Housekeeping or managing your Housekeeping? N -  Some recent data might be hidden    Patient Care Team: Colon Branch, MD as PCP - General (Internal Medicine) Stanford Breed Denice Bors, MD as PCP - Cardiology (Cardiology) Stanford Breed Denice Bors, MD as Consulting Physician (Cardiology) Juanita Craver, MD as Consulting Physician (Gastroenterology) Earlie Server, MD as Consulting Physician (Orthopedic Surgery) Wilford Corner, MD as Consulting Physician (Ophthalmology) Melina Schools, MD as Consulting Physician (Orthopedic Surgery) Leonia Corona, MD as Referring Physician (Ophthalmology) Day, Melvenia Beam, [facility] (Inactive) as Pharmacist (Pharmacist)  Indicate any recent Medical Services you may have received from other than [facility] providers in the past year (date may be approximate).     Assessment:   This is a routine wellness examination for [place].  Hearing/Vision screen Hearing Screening - Comments:: No issues Vision Screening - Comments:: Last eye exam-07/2021-Dr. Clide Dales  Dietary issues and exercise activities discussed: Current Exercise Habits: Home exercise routine, Type of exercise: strength training/weights;Other - see comments (golfing & rowing  machine), Time (Minutes): 30, Frequency (Times/Week): 6, Weekly Exercise (Minutes/Week): 180, Intensity: Mild, Exercise  limited by: None identified   Goals Addressed             This Visit's Progress    Maintain healthy active lifestyle.   On track      Depression Screen PHQ 2/9 Scores 08/01/2021 12/06/2020 04/10/2020 07/28/2019 02/24/2019 12/30/2017 12/16/2016  PHQ - 2 Score 0 0 0 0 0 0 0    Fall Risk Fall Risk  08/01/2021 12/06/2020 04/10/2020 07/28/2019 02/24/2019  Falls in the past year? 0 0 0 0 0  Number falls in past yr: 0 0 0 - -  Injury with Fall? 0 0 0 - -  Risk Factor Category  - - - - -  Follow up Falls prevention discussed - Education provided;Falls prevention discussed - -    FALL RISK PREVENTION PERTAINING TO THE HOME:  Any stairs in or around the home? No  Home free of loose throw rugs in walkways, pet beds, electrical cords, etc? Yes  Adequate lighting in your home to reduce risk of falls? Yes   ASSISTIVE DEVICES UTILIZED TO PREVENT FALLS:  Life alert? No  Use of a cane, walker or w/c? No  Grab bars in the bathroom? Yes  Shower chair or bench in shower? No  Elevated toilet seat or a handicapped toilet? No   TIMED UP AND GO:  Was the test performed? No . Phone visit   Cognitive Function:Normal cognitive status assessed by this Nurse Health Advisor. No abnormalities found.          Immunizations Immunization History  Administered Date(s) Administered   Influenza Split 08/27/2012, 09/06/2014, 09/09/2018   Influenza Whole 09/04/2009   Influenza, High Dose Seasonal PF 09/07/2019   Influenza-Unspecified 09/25/2015, 09/09/2016, 09/08/2017, 09/08/2020   PFIZER Comirnaty(Gray Top)Covid-19 Tri-Sucrose Vaccine 07/09/2021   PFIZER(Purple Top)SARS-COV-2 Vaccination 12/30/2019, 01/17/2020, 09/08/2020   Pneumococcal Conjugate-13 04/05/2014   Pneumococcal Polysaccharide-23 01/07/2012   Td 10/20/2009   Tdap 03/17/2012, 02/14/2015   Zoster Recombinat (Shingrix)  10/27/2018, 12/26/2018   Zoster, Live 01/17/2015    TDAP status: Up to date  Flu Vaccine status: Due, Education has been provided regarding the importance of this vaccine. Advised may receive this vaccine at local pharmacy or Health Dept. Aware to provide a copy of the vaccination record if obtained from local pharmacy or Health Dept. Verbalized acceptance and understanding.  Pneumococcal vaccine status: Up to date  Covid-19 vaccine status: Completed vaccines  Qualifies for Shingles Vaccine? No   Zostavax completed Yes   Shingrix Completed?: Yes  Screening Tests Health Maintenance  Topic Date Due   OPHTHALMOLOGY EXAM  05/22/2021   INFLUENZA VACCINE  07/09/2021   COVID-19 Vaccine (5 - Booster for Pfizer series) 11/08/2021   HEMOGLOBIN A1C  01/09/2022   FOOT EXAM  02/05/2022   TETANUS/TDAP  02/13/2025   PNA vac Low Risk Adult  Completed   Zoster Vaccines- Shingrix  Completed   HPV VACCINES  Aged Out    Health Maintenance  Health Maintenance Due  Topic Date Due   OPHTHALMOLOGY EXAM  05/22/2021   INFLUENZA VACCINE  07/09/2021    Colorectal cancer screening: No longer required.   Lung Cancer Screening: (Low Dose CT Chest recommended if Age 67-80 years, 30 pack-year currently smoking OR have quit w/in 15years.) does not qualify.     Additional Screening:  Hepatitis C Screening: does not qualify  Vision Screening: Recommended annual ophthalmology exams for early detection of glaucoma and other disorders of the eye. Is the patient up to date with their annual eye exam?  Yes  Who is the provider or what is the name of the office in which the patient attends annual eye exams? Dr. Clide Dales   Dental Screening: Recommended annual dental exams for proper oral hygiene  Community Resource Referral / Chronic Care Management: CRR required this visit?  No   CCM required this visit?  No      Plan:     I have personally reviewed and noted the following in the patient's  chart:   Medical and social history Use of alcohol, tobacco or illicit drugs  Current medications and supplements including opioid prescriptions. Patient is not currently taking opioid prescriptions. Functional ability and status Nutritional status Physical activity Advanced directives List of other physicians Hospitalizations, surgeries, and ER visits in previous 12 months Vitals Screenings to include cognitive, depression, and falls Referrals and appointments  In addition, I have reviewed and discussed with patient certain preventive protocols, quality metrics, and best practice recommendations. A written personalized care plan for preventive services as well as general preventive health recommendations were provided to patient.   Due to this being a telephonic visit, the after visit summary with patients personalized plan was offered to patient via mail or my-chart.  Patient would like to access on my-chart.    Marta Antu, LPN   [id number]  Nurse Health Advisor  Nurse Notes: [facility]  I have reviewed and agree with [facility] Coaches documentation.  Kathlene November, MD

## 2021-08-01 NOTE — Patient Instructions (Signed)
[name] , Thank you for taking time to complete your Medicare Wellness Visit. I appreciate your ongoing commitment to your health goals. Please review the following plan we discussed and let me know if I can assist you in the future.   Screening recommendations/referrals: Colonoscopy: No longer required Recommended yearly ophthalmology/optometry visit for glaucoma screening and checkup Recommended yearly dental visit for hygiene and checkup  Vaccinations: Influenza vaccine: Due Pneumococcal vaccine: Up to date Tdap vaccine: Up to date- Due-02/13/2025 Shingles vaccine: Completed vaccines   Covid-19: Up to date  Advanced directives: Please bring a copy for your chart  Conditions/risks identified: See problem list  Next appointment: Follow up in one year for your annual wellness visit.   Preventive Care 17 Years and Older, Male Preventive care refers to lifestyle choices and visits with your health care provider that can promote health and wellness. What does preventive care include? A yearly physical exam. This is also called an annual well check. Dental exams once or twice a year. Routine eye exams. Ask your health care provider how often you should have your eyes checked. Personal lifestyle choices, including: Daily care of your teeth and gums. Regular physical activity. Eating a healthy diet. Avoiding tobacco and drug use. Limiting alcohol use. Practicing safe sex. Taking low doses of aspirin every day. Taking vitamin and mineral supplements as recommended by your health care provider. What happens during an annual well check? The services and screenings done by your health care provider during your annual well check will depend on your age, overall health, lifestyle risk factors, and family history of disease. Counseling  Your health care provider may ask you questions about your: Alcohol use. Tobacco use. Drug use. Emotional well-being. Home and relationship  well-being. Sexual activity. Eating habits. History of falls. Memory and ability to understand (cognition). Work and work Statistician. Screening  You may have the following tests or measurements: Height, weight, and BMI. Blood pressure. Lipid and cholesterol levels. These may be checked every 5 years, or more frequently if you are over 27 years old. Skin check. Lung cancer screening. You may have this screening every year starting at age 6 if you have a 30-pack-year history of smoking and currently smoke or have quit within the past 15 years. Fecal occult blood test (FOBT) of the stool. You may have this test every year starting at age 55. Flexible sigmoidoscopy or colonoscopy. You may have a sigmoidoscopy every 5 years or a colonoscopy every 10 years starting at age 43. Prostate cancer screening. Recommendations will vary depending on your family history and other risks. Hepatitis C blood test. Hepatitis B blood test. Sexually transmitted disease (STD) testing. Diabetes screening. This is done by checking your blood sugar (glucose) after you have not eaten for a while (fasting). You may have this done every 1-3 years. Abdominal aortic aneurysm (AAA) screening. You may need this if you are a current or former smoker. Osteoporosis. You may be screened starting at age 99 if you are at high risk. Talk with your health care provider about your test results, treatment options, and if necessary, the need for more tests. Vaccines  Your health care provider may recommend certain vaccines, such as: Influenza vaccine. This is recommended every year. Tetanus, diphtheria, and acellular pertussis (Tdap, Td) vaccine. You may need a Td booster every 10 years. Zoster vaccine. You may need this after age 6. Pneumococcal 13-valent conjugate (PCV13) vaccine. One dose is recommended after age 9. Pneumococcal polysaccharide (PPSV23) vaccine. One dose  is recommended after age 34. Talk to your health care  provider about which screenings and vaccines you need and how often you need them. This information is not intended to replace advice given to you by your health care provider. Make sure you discuss any questions you have with your health care provider. Document Released: 12/22/2015 Document Revised: 08/14/2016 Document Reviewed: 09/26/2015 Elsevier Interactive Patient Education  2017 [place] Prevention in the Home Falls can cause injuries. They can happen to people of all ages. There are many things you can do to make your home safe and to help prevent falls. What can I do on the outside of my home? Regularly fix the edges of walkways and driveways and fix any cracks. Remove anything that might make you trip as you walk through a door, such as a raised step or threshold. Trim any bushes or trees on the path to your home. Use bright outdoor lighting. Clear any walking paths of anything that might make someone trip, such as rocks or tools. Regularly check to see if handrails are loose or broken. Make sure that both sides of any steps have handrails. Any raised decks and porches should have guardrails on the edges. Have any leaves, snow, or ice cleared regularly. Use sand or salt on walking paths during winter. Clean up any spills in your garage right away. This includes oil or grease spills. What can I do in the bathroom? Use night lights. Install grab bars by the toilet and in the tub and shower. Do not use towel bars as grab bars. Use non-skid mats or decals in the tub or shower. If you need to sit down in the shower, use a plastic, non-slip stool. Keep the floor dry. Clean up any water that spills on the floor as soon as it happens. Remove soap buildup in the tub or shower regularly. Attach bath mats securely with double-sided non-slip rug tape. Do not have throw rugs and other things on the floor that can make you trip. What can I do in the bedroom? Use night lights. Make  sure that you have a light by your bed that is easy to reach. Do not use any sheets or blankets that are too big for your bed. They should not hang down onto the floor. Have a firm chair that has side arms. You can use this for support while you get dressed. Do not have throw rugs and other things on the floor that can make you trip. What can I do in the kitchen? Clean up any spills right away. Avoid walking on wet floors. Keep items that you use a lot in easy-to-reach places. If you need to reach something above you, use a strong step stool that has a grab bar. Keep electrical cords out of the way. Do not use floor polish or wax that makes floors slippery. If you must use wax, use non-skid floor wax. Do not have throw rugs and other things on the floor that can make you trip. What can I do with my stairs? Do not leave any items on the stairs. Make sure that there are handrails on both sides of the stairs and use them. Fix handrails that are broken or loose. Make sure that handrails are as long as the stairways. Check any carpeting to make sure that it is firmly attached to the stairs. Fix any carpet that is loose or worn. Avoid having throw rugs at the top or bottom of the stairs.  If you do have throw rugs, attach them to the floor with carpet tape. Make sure that you have a light switch at the top of the stairs and the bottom of the stairs. If you do not have them, ask someone to add them for you. What else can I do to help prevent falls? Wear shoes that: Do not have high heels. Have rubber bottoms. Are comfortable and fit you well. Are closed at the toe. Do not wear sandals. If you use a stepladder: Make sure that it is fully opened. Do not climb a closed stepladder. Make sure that both sides of the stepladder are locked into place. Ask someone to hold it for you, if possible. Clearly mark and make sure that you can see: Any grab bars or handrails. First and last steps. Where the  edge of each step is. Use tools that help you move around (mobility aids) if they are needed. These include: Canes. Walkers. Scooters. Crutches. Turn on the lights when you go into a dark area. Replace any light bulbs as soon as they burn out. Set up your furniture so you have a clear path. Avoid moving your furniture around. If any of your floors are uneven, fix them. If there are any pets around you, be aware of where they are. Review your medicines with your doctor. Some medicines can make you feel dizzy. This can increase your chance of falling. Ask your doctor what other things that you can do to help prevent falls. This information is not intended to replace advice given to you by your health care provider. Make sure you discuss any questions you have with your health care provider. Document Released: 09/21/2009 Document Revised: 05/02/2016 Document Reviewed: 12/30/2014 Elsevier Interactive Patient Education  2017 [employer].

## 2021-08-08 ENCOUNTER — Other Ambulatory Visit (HOSPITAL_COMMUNITY): Payer: Medicare Other

## 2021-08-08 ENCOUNTER — Encounter (HOSPITAL_COMMUNITY): Payer: Self-pay | Admitting: Cardiology

## 2021-08-09 DIAGNOSIS — H16213 Exposure keratoconjunctivitis, bilateral: Secondary | ICD-10-CM | POA: Diagnosis not present

## 2021-08-09 DIAGNOSIS — I2510 Atherosclerotic heart disease of native coronary artery without angina pectoris: Secondary | ICD-10-CM | POA: Diagnosis not present

## 2021-08-09 DIAGNOSIS — H02839 Dermatochalasis of unspecified eye, unspecified eyelid: Secondary | ICD-10-CM | POA: Diagnosis not present

## 2021-08-09 DIAGNOSIS — H02103 Unspecified ectropion of right eye, unspecified eyelid: Secondary | ICD-10-CM | POA: Diagnosis not present

## 2021-08-09 DIAGNOSIS — H02106 Unspecified ectropion of left eye, unspecified eyelid: Secondary | ICD-10-CM | POA: Diagnosis not present

## 2021-08-09 DIAGNOSIS — H53453 Other localized visual field defect, bilateral: Secondary | ICD-10-CM | POA: Diagnosis not present

## 2021-08-09 DIAGNOSIS — Z510 Encounter for antineoplastic radiation therapy: Secondary | ICD-10-CM | POA: Diagnosis not present

## 2021-08-09 DIAGNOSIS — C44320 Squamous cell carcinoma of skin of unspecified parts of face: Secondary | ICD-10-CM | POA: Diagnosis not present

## 2021-08-10 DIAGNOSIS — C44320 Squamous cell carcinoma of skin of unspecified parts of face: Secondary | ICD-10-CM | POA: Diagnosis not present

## 2021-08-14 ENCOUNTER — Encounter: Payer: Self-pay | Admitting: Internal Medicine

## 2021-08-14 DIAGNOSIS — Z20822 Contact with and (suspected) exposure to covid-19: Secondary | ICD-10-CM | POA: Diagnosis not present

## 2021-08-15 ENCOUNTER — Encounter: Payer: Self-pay | Admitting: Internal Medicine

## 2021-08-15 ENCOUNTER — Other Ambulatory Visit: Payer: Medicare Other

## 2021-08-15 ENCOUNTER — Telehealth (INDEPENDENT_AMBULATORY_CARE_PROVIDER_SITE_OTHER): Payer: Medicare Other | Admitting: Internal Medicine

## 2021-08-15 VITALS — BP 125/61 | HR 62 | Temp 98.9°F | Ht 71.0 in | Wt 218.0 lb

## 2021-08-15 DIAGNOSIS — U071 COVID-19: Secondary | ICD-10-CM | POA: Diagnosis not present

## 2021-08-15 MED ORDER — MOLNUPIRAVIR EUA 200MG CAPSULE: 4.0000 | ORAL_CAPSULE | Freq: Two times a day (BID) | ORAL | 0 refills | Status: AC

## 2021-08-15 NOTE — Progress Notes (Signed)
Subjective:    Patient ID: [name], male    DOB: 1931-09-21, 87 y.o.   MRN: [id number]  DOS:  08/15/2021 Type of visit - description: Virtual Visit via Telephone    I connected with above mentioned patient  by telephone and verified that I am speaking with the correct person using two identifiers.  THIS ENCOUNTER IS A VIRTUAL VISIT DUE TO COVID-19 - PATIENT WAS NOT SEEN IN THE OFFICE. PATIENT HAS CONSENTED TO VIRTUAL VISIT / TELEMEDICINE VISIT   Location of patient: home  Location of provider: office  Persons participating in the virtual visit: patient, provider   I discussed the limitations, risks, security and privacy concerns of performing an evaluation and management service by telephone and the availability of in person appointments. I also discussed with the patient that there may be a patient responsible charge related to this service. The patient expressed understanding and agreed to proceed.  Acute Symptoms a started 2 days ago with cough, chills, lack of energy. He lives with his girlfriend, she has COVID, the patient himself got tested yesterday and it came back positive. Today, he feels perhaps slightly better.  Denies nausea or vomiting. No chest pain no difficulty breathing    Review of Systems See above   Past Medical History:  Diagnosis Date   Anemia    Antral gastritis 2013   EGD    Atrial fibrillation (HCC)    CAD (coronary artery disease)    s/p CABG 1993 (L-LAD, S-RI, S-D1);   echo 1/10: EF 55%;    myoview 12/09: inf MI, no ischemia   Candida esophagitis ([place]) 2013   EGD    Cataracts, bilateral    Family history of malignant neoplasm of gastrointestinal tract    Folliculitis    Hiatal hernia    HTN (hypertension)    Hyperlipidemia    Irritable bladder    Myocardial infarction [facility])    Obesity    Osteoarthritis     Past Surgical History:  Procedure Laterality Date   CATARACT EXTRACTION Right 07/21/2018   CHOLECYSTECTOMY     laparoscopic  '92   CORONARY ARTERY BYPASS GRAFT     graft '92: LIMA-LAD, SVG - D2,R1, D1   ENTEROSCOPY  08/22/2012   Procedure: ENTEROSCOPY;  Surgeon: Juanita Craver, MD;  Location: [facility];  Service: Endoscopy;  Laterality: N/A;   INTRAOCULAR LENS EXCHANGE Right 07/21/2018   KNEE SURGERY     MOLE REMOVAL  2001 and 2008   PAROTIDECTOMY Right 06/18/2021   PILONIDAL CYST EXCISION      Allergies as of 08/15/2021   No Known Allergies      Medication List        Accurate as of August 15, 2021 11:44 AM. If you have any questions, ask your nurse or doctor.          atorvastatin 80 MG tablet Commonly known as: LIPITOR Take 0.5 tablets (40 mg total) by mouth daily.   benazepril 20 MG tablet Commonly known as: LOTENSIN Take 1 tablet (20 mg total) by mouth daily.   CoQ10 100 MG Caps Take 1 capsule by mouth daily.   dapagliflozin propanediol 5 MG Tabs tablet Commonly known as: FARXIGA Take 5 mg by mouth daily.   diphenhydramine-acetaminophen 25-500 MG Tabs tablet Commonly known as: TYLENOL PM Take 2 tablets by mouth at bedtime.   furosemide 40 MG tablet Commonly known as: LASIX Take 1 tablet (40 mg total) by mouth daily.   Melatonin 10 MG  Tabs Take 10 mg by mouth at bedtime.   metoprolol succinate 25 MG 24 hr tablet Commonly known as: TOPROL-XL Take 1 tablet (25 mg total) by mouth daily. Take with or immediately following a meal   multivitamin with minerals Tabs tablet Take 1 tablet by mouth daily.   pantoprazole 40 MG tablet Commonly known as: PROTONIX Take 1 tablet (40 mg total) by mouth daily.   Rivaroxaban 15 MG Tabs tablet Commonly known as: Xarelto Take 1 tablet (15 mg total) by mouth daily with supper.   tamsulosin 0.4 MG Caps capsule Commonly known as: FLOMAX TAKE 1 CAPSULE(0.4 MG) BY MOUTH DAILY           Objective:   Physical Exam BP 125/61   Pulse 62   Temp 98.9 F (37.2 C)   Ht '5\' 11"'$  (1.803 m)   Wt 218 lb (98.9 kg)   BMI 30.40 kg/m  This is  a virtual telephone visit, he sounded well, in no distress, speaking in complete sentences, no cough noted.    Assessment     Assessment  DM: diet control, no neuropathy HTN Hyperlipidemia CRI  CV: --CAD, CABG 1993, Myoview 2009 no ischemia. --Atrial fibrillation -- rate control, xarelto -- chronic combined systolic/diastolic congestive heart failure Venous insufficiency, mild LE edema L>>R  LUTS    MSK: DJD, spinal stenosis, R wrist Fx in the 80s GI: --Recurrent melena: Work-up 2013 Dr. Sharlett Iles: Colonoscopy, EGD, capsule endoscopy.  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 He sounded well on the phone. O2 sat not available. Explained that with medications, his chances to get sicker decrease significantly. Plan: Molnupiravir, rest, drink plenty fluids, continue routine medicines, OTC Robitussin or similar medication to help with cough. Call if not gradually better Seek medical attention if severe symptoms. He verbalized understanding     I discussed the assessment and treatment plan with the patient. The patient was provided an opportunity to ask questions and all were answered. The patient agreed with the plan and demonstrated an understanding of the instructions.   The patient was advised to call back or seek an in-person evaluation if the symptoms worsen or if the condition fails to improve as anticipated.  I provided 20 minutes of non-face-to-face time during this encounter.  Kathlene November, MD

## 2021-08-17 NOTE — Assessment & Plan Note (Signed)
COVID-19 The patient is 87 years old, multiple comorbidities, had 4 COVID vaccinations previously, developed symptoms 2 days ago and tested positive for COVID yesterday. He sounded well on the phone. O2 sat not available. Explained that with medications, his chances to get sicker decrease significantly. Plan: Molnupiravir, rest, drink plenty fluids, continue routine medicines, OTC Robitussin or similar medication to help with cough. Call if not gradually better Seek medical attention if severe symptoms. He verbalized understanding

## 2021-08-22 ENCOUNTER — Other Ambulatory Visit: Payer: Self-pay | Admitting: Internal Medicine

## 2021-08-23 DIAGNOSIS — C44320 Squamous cell carcinoma of skin of unspecified parts of face: Secondary | ICD-10-CM | POA: Diagnosis not present

## 2021-08-23 DIAGNOSIS — Z510 Encounter for antineoplastic radiation therapy: Secondary | ICD-10-CM | POA: Diagnosis not present

## 2021-08-24 DIAGNOSIS — Z510 Encounter for antineoplastic radiation therapy: Secondary | ICD-10-CM | POA: Diagnosis not present

## 2021-08-24 DIAGNOSIS — C44320 Squamous cell carcinoma of skin of unspecified parts of face: Secondary | ICD-10-CM | POA: Diagnosis not present

## 2021-08-27 DIAGNOSIS — C44320 Squamous cell carcinoma of skin of unspecified parts of face: Secondary | ICD-10-CM | POA: Diagnosis not present

## 2021-08-27 DIAGNOSIS — Z510 Encounter for antineoplastic radiation therapy: Secondary | ICD-10-CM | POA: Diagnosis not present

## 2021-08-28 DIAGNOSIS — C44320 Squamous cell carcinoma of skin of unspecified parts of face: Secondary | ICD-10-CM | POA: Diagnosis not present

## 2021-08-28 DIAGNOSIS — Z510 Encounter for antineoplastic radiation therapy: Secondary | ICD-10-CM | POA: Diagnosis not present

## 2021-08-29 DIAGNOSIS — C44320 Squamous cell carcinoma of skin of unspecified parts of face: Secondary | ICD-10-CM | POA: Diagnosis not present

## 2021-08-29 DIAGNOSIS — Z510 Encounter for antineoplastic radiation therapy: Secondary | ICD-10-CM | POA: Diagnosis not present

## 2021-08-30 DIAGNOSIS — C44320 Squamous cell carcinoma of skin of unspecified parts of face: Secondary | ICD-10-CM | POA: Diagnosis not present

## 2021-08-30 DIAGNOSIS — Z510 Encounter for antineoplastic radiation therapy: Secondary | ICD-10-CM | POA: Diagnosis not present

## 2021-08-31 DIAGNOSIS — C44320 Squamous cell carcinoma of skin of unspecified parts of face: Secondary | ICD-10-CM | POA: Diagnosis not present

## 2021-08-31 DIAGNOSIS — Z510 Encounter for antineoplastic radiation therapy: Secondary | ICD-10-CM | POA: Diagnosis not present

## 2021-09-03 ENCOUNTER — Ambulatory Visit (HOSPITAL_COMMUNITY): Payer: Medicare Other | Attending: Cardiology

## 2021-09-03 ENCOUNTER — Other Ambulatory Visit: Payer: Self-pay

## 2021-09-03 DIAGNOSIS — C44320 Squamous cell carcinoma of skin of unspecified parts of face: Secondary | ICD-10-CM | POA: Diagnosis not present

## 2021-09-03 DIAGNOSIS — I2510 Atherosclerotic heart disease of native coronary artery without angina pectoris: Secondary | ICD-10-CM

## 2021-09-03 DIAGNOSIS — Z510 Encounter for antineoplastic radiation therapy: Secondary | ICD-10-CM | POA: Diagnosis not present

## 2021-09-03 LAB — ECHOCARDIOGRAM COMPLETE
Area-P 1/2: 2.61 cm2
S' Lateral: 4.8 cm

## 2021-09-04 DIAGNOSIS — Z510 Encounter for antineoplastic radiation therapy: Secondary | ICD-10-CM | POA: Diagnosis not present

## 2021-09-04 DIAGNOSIS — C44320 Squamous cell carcinoma of skin of unspecified parts of face: Secondary | ICD-10-CM | POA: Diagnosis not present

## 2021-09-05 DIAGNOSIS — Z510 Encounter for antineoplastic radiation therapy: Secondary | ICD-10-CM | POA: Diagnosis not present

## 2021-09-05 DIAGNOSIS — C44320 Squamous cell carcinoma of skin of unspecified parts of face: Secondary | ICD-10-CM | POA: Diagnosis not present

## 2021-09-06 ENCOUNTER — Other Ambulatory Visit: Payer: Self-pay | Admitting: Internal Medicine

## 2021-09-06 DIAGNOSIS — C44320 Squamous cell carcinoma of skin of unspecified parts of face: Secondary | ICD-10-CM | POA: Diagnosis not present

## 2021-09-06 DIAGNOSIS — I4891 Unspecified atrial fibrillation: Secondary | ICD-10-CM

## 2021-09-06 DIAGNOSIS — Z510 Encounter for antineoplastic radiation therapy: Secondary | ICD-10-CM | POA: Diagnosis not present

## 2021-09-07 DIAGNOSIS — Z510 Encounter for antineoplastic radiation therapy: Secondary | ICD-10-CM | POA: Diagnosis not present

## 2021-09-07 DIAGNOSIS — C44320 Squamous cell carcinoma of skin of unspecified parts of face: Secondary | ICD-10-CM | POA: Diagnosis not present

## 2021-09-10 DIAGNOSIS — Z510 Encounter for antineoplastic radiation therapy: Secondary | ICD-10-CM | POA: Diagnosis not present

## 2021-09-10 DIAGNOSIS — C44320 Squamous cell carcinoma of skin of unspecified parts of face: Secondary | ICD-10-CM | POA: Diagnosis not present

## 2021-09-11 ENCOUNTER — Telehealth: Payer: Self-pay | Admitting: Internal Medicine

## 2021-09-11 DIAGNOSIS — Z510 Encounter for antineoplastic radiation therapy: Secondary | ICD-10-CM | POA: Diagnosis not present

## 2021-09-11 DIAGNOSIS — C44320 Squamous cell carcinoma of skin of unspecified parts of face: Secondary | ICD-10-CM | POA: Diagnosis not present

## 2021-09-11 MED ORDER — DAPAGLIFLOZIN PROPANEDIOL 5 MG PO TABS: 5.0000 mg | ORAL_TABLET | Freq: Every day | ORAL | 1 refills | Status: DC

## 2021-09-11 NOTE — Telephone Encounter (Signed)
Rx sent 

## 2021-09-11 NOTE — Telephone Encounter (Signed)
Medication:  dapagliflozin propanediol (FARXIGA) 5 MG TABS tablet   Has the patient contacted their pharmacy? No. (If no, request that the patient contact the pharmacy for the refill.) (If yes, when and what did the pharmacy advise?)    Preferred Pharmacy (with phone number or street name):  [facility] [address], [place], [place] [address] Phone: [phone]

## 2021-09-12 ENCOUNTER — Other Ambulatory Visit: Payer: Self-pay

## 2021-09-12 ENCOUNTER — Other Ambulatory Visit (INDEPENDENT_AMBULATORY_CARE_PROVIDER_SITE_OTHER): Payer: Medicare Other

## 2021-09-12 DIAGNOSIS — Z510 Encounter for antineoplastic radiation therapy: Secondary | ICD-10-CM | POA: Diagnosis not present

## 2021-09-12 DIAGNOSIS — C44320 Squamous cell carcinoma of skin of unspecified parts of face: Secondary | ICD-10-CM | POA: Diagnosis not present

## 2021-09-12 DIAGNOSIS — E1159 Type 2 diabetes mellitus with other circulatory complications: Secondary | ICD-10-CM | POA: Diagnosis not present

## 2021-09-13 ENCOUNTER — Other Ambulatory Visit: Payer: Medicare Other

## 2021-09-13 DIAGNOSIS — Z510 Encounter for antineoplastic radiation therapy: Secondary | ICD-10-CM | POA: Diagnosis not present

## 2021-09-13 DIAGNOSIS — C44320 Squamous cell carcinoma of skin of unspecified parts of face: Secondary | ICD-10-CM | POA: Diagnosis not present

## 2021-09-13 LAB — BASIC METABOLIC PANEL
BUN: 25 mg/dL — ABNORMAL HIGH (ref 6–23)
CO2: 28 mEq/L (ref 19–32)
Calcium: 9.8 mg/dL (ref 8.4–10.5)
Chloride: 104 mEq/L (ref 96–112)
Creatinine, Ser: 1.65 mg/dL — ABNORMAL HIGH (ref 0.40–1.50)
GFR: 37.02 mL/min — ABNORMAL LOW (ref >60.00)
Glucose, Bld: 98 mg/dL (ref 70–99)
Potassium: 4.1 mEq/L (ref 3.5–5.1)
Sodium: 140 mEq/L (ref 135–145)

## 2021-09-14 DIAGNOSIS — Z510 Encounter for antineoplastic radiation therapy: Secondary | ICD-10-CM | POA: Diagnosis not present

## 2021-09-14 DIAGNOSIS — C44320 Squamous cell carcinoma of skin of unspecified parts of face: Secondary | ICD-10-CM | POA: Diagnosis not present

## 2021-09-17 DIAGNOSIS — C44320 Squamous cell carcinoma of skin of unspecified parts of face: Secondary | ICD-10-CM | POA: Diagnosis not present

## 2021-09-17 DIAGNOSIS — Z510 Encounter for antineoplastic radiation therapy: Secondary | ICD-10-CM | POA: Diagnosis not present

## 2021-09-18 DIAGNOSIS — Z510 Encounter for antineoplastic radiation therapy: Secondary | ICD-10-CM | POA: Diagnosis not present

## 2021-09-18 DIAGNOSIS — C44320 Squamous cell carcinoma of skin of unspecified parts of face: Secondary | ICD-10-CM | POA: Diagnosis not present

## 2021-09-19 DIAGNOSIS — C44320 Squamous cell carcinoma of skin of unspecified parts of face: Secondary | ICD-10-CM | POA: Diagnosis not present

## 2021-09-19 DIAGNOSIS — Z510 Encounter for antineoplastic radiation therapy: Secondary | ICD-10-CM | POA: Diagnosis not present

## 2021-09-26 DIAGNOSIS — C7989 Secondary malignant neoplasm of other specified sites: Secondary | ICD-10-CM | POA: Diagnosis not present

## 2021-09-26 DIAGNOSIS — H02103 Unspecified ectropion of right eye, unspecified eyelid: Secondary | ICD-10-CM | POA: Diagnosis not present

## 2021-09-26 DIAGNOSIS — H6121 Impacted cerumen, right ear: Secondary | ICD-10-CM | POA: Diagnosis not present

## 2021-10-03 ENCOUNTER — Telehealth: Payer: Self-pay | Admitting: Cardiology

## 2021-10-03 NOTE — Telephone Encounter (Signed)
Covering preop today. Last OV 07/2021, with echo 08/2021 with stable LV dysfunction EF 40-45%. Will route to pharm then pt will need call.

## 2021-10-03 NOTE — Telephone Encounter (Signed)
   [facility] Pre-operative Risk Assessment    Patient Name: [name]  DOB: 1931-07-28 MRN: [id number]  HEARTCARE STAFF:  - IMPORTANT!!!!!! Under Visit Info/Reason for Call, type in Other and utilize the format Clearance MM/DD/YY or Clearance TBD. Do not use dashes or single digits. - Please review there is not already an duplicate clearance open for this procedure. - If request is for dental extraction, please clarify the # of teeth to be extracted. - If the patient is currently at the dentist's office, call Pre-Op Callback Staff (MA/nurse) to input urgent request.  - If the patient is not currently in the dentist office, please route to the Pre-Op pool.  Request for surgical clearance:  What type of surgery is being performed? Upper and lower blepharoplasty   When is this surgery scheduled? TBD  What type of clearance is required (medical clearance vs. Pharmacy clearance to hold med vs. Both)? Pharmacy   Are there any medications that need to be held prior to surgery and how long? Xarelto, and any other blood thinners he may be on, standard is 3-4 days, but requesting cardiologists recommended hold length  Practice name and name of physician performing surgery? [facility] Dr. Lance Coon  What is the office phone number? [phone]   7.   What is the office fax number? [phone]  8.   Anesthesia type (None, local, MAC, general) ? MAC   Selena Zobro 10/03/2021, 3:09 PM  _________________________________________________________________   (provider comments below)

## 2021-10-04 NOTE — Telephone Encounter (Signed)
Patient with diagnosis of atrial fibrillation on Xarelto for anticoagulation.    Procedure: upper and lower blepharoplasty Date of procedure: TBD   CHA2DS2-VASc Score = 6   This indicates a 9.7% annual risk of stroke. The patient's score is based upon: CHF History: 1 HTN History: 1 Diabetes History: 1 Stroke History: 0 Vascular Disease History: 1 Age Score: 2 Gender Score: 0   CrCl 38 Platelet count 185  Per office protocol, patient can hold Xarelto for 3 days prior to procedure.   Patient will not need bridging with Lovenox (enoxaparin) around procedure.

## 2021-10-04 NOTE — Telephone Encounter (Signed)
    Patient Name: [name]  DOB: March 02, 1931 MRN: [id number]  Primary Cardiologist: Kirk Ruths, MD  Chart reviewed as part of pre-operative protocol coverage.   Per pharmacy recommendation, patient can hold xarelto 3 days prior to his upcoming procedure with plans to restart as soon as he is cleared to do so by his surgeon.   I will route this recommendation to the requesting party via Epic fax function and remove from pre-op pool.  Please call with questions.  Abigail Butts, PA-C 10/04/2021, 4:01 PM

## 2021-10-08 MED ORDER — APIXABAN 2.5 MG PO TABS: 2.5000 mg | ORAL_TABLET | Freq: Two times a day (BID) | ORAL | 1 refills | Status: DC

## 2021-10-08 NOTE — Telephone Encounter (Signed)
Pt 89 M 98.4 kg, SCr 1.65  - LOV [place] 8/22.  Will need Eliquis 2.5 mg bid.  Rx sent and instructions to patient via [place]

## 2021-10-15 ENCOUNTER — Encounter: Payer: Self-pay | Admitting: Internal Medicine

## 2021-10-24 DIAGNOSIS — L821 Other seborrheic keratosis: Secondary | ICD-10-CM | POA: Diagnosis not present

## 2021-10-24 DIAGNOSIS — D485 Neoplasm of uncertain behavior of skin: Secondary | ICD-10-CM | POA: Diagnosis not present

## 2021-10-24 DIAGNOSIS — L570 Actinic keratosis: Secondary | ICD-10-CM | POA: Diagnosis not present

## 2021-10-24 DIAGNOSIS — L820 Inflamed seborrheic keratosis: Secondary | ICD-10-CM | POA: Diagnosis not present

## 2021-10-24 DIAGNOSIS — Z86008 Personal history of in-situ neoplasm of other site: Secondary | ICD-10-CM | POA: Diagnosis not present

## 2021-11-13 ENCOUNTER — Encounter: Payer: Medicare Other | Admitting: Internal Medicine

## 2021-11-16 DIAGNOSIS — E785 Hyperlipidemia, unspecified: Secondary | ICD-10-CM | POA: Diagnosis not present

## 2021-11-16 DIAGNOSIS — H02831 Dermatochalasis of right upper eyelid: Secondary | ICD-10-CM | POA: Diagnosis not present

## 2021-11-16 DIAGNOSIS — Z951 Presence of aortocoronary bypass graft: Secondary | ICD-10-CM | POA: Diagnosis not present

## 2021-11-16 DIAGNOSIS — Z87891 Personal history of nicotine dependence: Secondary | ICD-10-CM | POA: Diagnosis not present

## 2021-11-16 DIAGNOSIS — Z955 Presence of coronary angioplasty implant and graft: Secondary | ICD-10-CM | POA: Diagnosis not present

## 2021-11-16 DIAGNOSIS — I4891 Unspecified atrial fibrillation: Secondary | ICD-10-CM | POA: Diagnosis not present

## 2021-11-16 DIAGNOSIS — Z9842 Cataract extraction status, left eye: Secondary | ICD-10-CM | POA: Diagnosis not present

## 2021-11-16 DIAGNOSIS — D759 Disease of blood and blood-forming organs, unspecified: Secondary | ICD-10-CM | POA: Diagnosis not present

## 2021-11-16 DIAGNOSIS — Z9049 Acquired absence of other specified parts of digestive tract: Secondary | ICD-10-CM | POA: Diagnosis not present

## 2021-11-16 DIAGNOSIS — I252 Old myocardial infarction: Secondary | ICD-10-CM | POA: Diagnosis not present

## 2021-11-16 DIAGNOSIS — Z7901 Long term (current) use of anticoagulants: Secondary | ICD-10-CM | POA: Diagnosis not present

## 2021-11-16 DIAGNOSIS — K219 Gastro-esophageal reflux disease without esophagitis: Secondary | ICD-10-CM | POA: Diagnosis not present

## 2021-11-16 DIAGNOSIS — Z961 Presence of intraocular lens: Secondary | ICD-10-CM | POA: Diagnosis not present

## 2021-11-16 DIAGNOSIS — Z79899 Other long term (current) drug therapy: Secondary | ICD-10-CM | POA: Diagnosis not present

## 2021-11-16 DIAGNOSIS — I509 Heart failure, unspecified: Secondary | ICD-10-CM | POA: Diagnosis not present

## 2021-11-16 DIAGNOSIS — I2510 Atherosclerotic heart disease of native coronary artery without angina pectoris: Secondary | ICD-10-CM | POA: Diagnosis not present

## 2021-11-16 DIAGNOSIS — H02105 Unspecified ectropion of left lower eyelid: Secondary | ICD-10-CM | POA: Diagnosis not present

## 2021-11-16 DIAGNOSIS — H02834 Dermatochalasis of left upper eyelid: Secondary | ICD-10-CM | POA: Diagnosis not present

## 2021-11-16 DIAGNOSIS — Z9841 Cataract extraction status, right eye: Secondary | ICD-10-CM | POA: Diagnosis not present

## 2021-11-16 DIAGNOSIS — I110 Hypertensive heart disease with heart failure: Secondary | ICD-10-CM | POA: Diagnosis not present

## 2021-11-16 DIAGNOSIS — H02102 Unspecified ectropion of right lower eyelid: Secondary | ICD-10-CM | POA: Diagnosis not present

## 2021-11-16 HISTORY — PX: BLEPHAROPLASTY: SUR158

## 2021-11-20 ENCOUNTER — Other Ambulatory Visit: Payer: Self-pay | Admitting: Internal Medicine

## 2021-11-21 ENCOUNTER — Ambulatory Visit (INDEPENDENT_AMBULATORY_CARE_PROVIDER_SITE_OTHER): Payer: Medicare Other | Admitting: Internal Medicine

## 2021-11-21 VITALS — BP 126/62 | HR 62 | Temp 97.7°F | Resp 16 | Ht 71.0 in | Wt 210.2 lb

## 2021-11-21 DIAGNOSIS — E7800 Pure hypercholesterolemia, unspecified: Secondary | ICD-10-CM | POA: Diagnosis not present

## 2021-11-21 DIAGNOSIS — I4891 Unspecified atrial fibrillation: Secondary | ICD-10-CM | POA: Diagnosis not present

## 2021-11-21 DIAGNOSIS — Z23 Encounter for immunization: Secondary | ICD-10-CM | POA: Diagnosis not present

## 2021-11-21 DIAGNOSIS — Z0000 Encounter for general adult medical examination without abnormal findings: Secondary | ICD-10-CM | POA: Diagnosis not present

## 2021-11-21 DIAGNOSIS — I10 Essential (primary) hypertension: Secondary | ICD-10-CM

## 2021-11-21 DIAGNOSIS — Z0001 Encounter for general adult medical examination with abnormal findings: Secondary | ICD-10-CM

## 2021-11-21 DIAGNOSIS — E1159 Type 2 diabetes mellitus with other circulatory complications: Secondary | ICD-10-CM | POA: Diagnosis not present

## 2021-11-21 LAB — COMPREHENSIVE METABOLIC PANEL
ALT: 25 U/L (ref 0–53)
AST: 22 U/L (ref 0–37)
Albumin: 4.2 g/dL (ref 3.5–5.2)
Alkaline Phosphatase: 66 U/L (ref 39–117)
BUN: 25 mg/dL — ABNORMAL HIGH (ref 6–23)
CO2: 29 mEq/L (ref 19–32)
Calcium: 9.8 mg/dL (ref 8.4–10.5)
Chloride: 102 mEq/L (ref 96–112)
Creatinine, Ser: 1.43 mg/dL (ref 0.40–1.50)
GFR: 43.9 mL/min — ABNORMAL LOW (ref >60.00)
Glucose, Bld: 115 mg/dL — ABNORMAL HIGH (ref 70–99)
Potassium: 4 mEq/L (ref 3.5–5.1)
Sodium: 140 mEq/L (ref 135–145)
Total Bilirubin: 0.6 mg/dL (ref 0.2–1.2)
Total Protein: 7.2 g/dL (ref 6.0–8.3)

## 2021-11-21 LAB — TSH: TSH: 3.8 u[IU]/mL (ref 0.35–5.50)

## 2021-11-21 LAB — HEMOGLOBIN A1C: Hgb A1c MFr Bld: 6.6 % — ABNORMAL HIGH (ref 4.6–6.5)

## 2021-11-21 MED ORDER — METOPROLOL SUCCINATE ER 25 MG PO TB24: ORAL_TABLET | ORAL | 1 refills | Status: DC

## 2021-11-21 MED ORDER — RYBELSUS 3 MG PO TABS: 3.0000 mg | ORAL_TABLET | Freq: Every day | ORAL | 4 refills | Status: DC

## 2021-11-21 MED ORDER — BENAZEPRIL HCL 20 MG PO TABS: 20.0000 mg | ORAL_TABLET | Freq: Every day | ORAL | 1 refills | Status: DC

## 2021-11-21 NOTE — Progress Notes (Signed)
Subjective:    Patient ID: [name], male    DOB: 1931/01/08, 87 y.o.   MRN: [id number]  DOS:  11/21/2021 Type of visit - description: CPX    Here for CPX We also discussed his routine medical issues. One of his concerns cost of medicines, [name] is expensive and is causing constipation.  Denies nausea, vomiting, diarrhea.  No blood in the stools. No recent ambulatory CBGs.   Review of Systems  Other than above, a 14 point review of systems is negative       Past Medical History:  Diagnosis Date   Anemia    Antral gastritis 2013   EGD    Atrial fibrillation ([place])    CAD (coronary artery disease)    s/p CABG 1993 (L-LAD, S-RI, S-D1);   echo 1/10: EF 55%;    myoview 12/09: inf MI, no ischemia   Candida esophagitis ([place]) 2013   EGD    Cataracts, bilateral    Family history of malignant neoplasm of gastrointestinal tract    Folliculitis    Hiatal hernia    HTN (hypertension)    Hyperlipidemia    Irritable bladder    Myocardial infarction [facility])    Obesity    Osteoarthritis     Past Surgical History:  Procedure Laterality Date   BLEPHAROPLASTY Bilateral 11/16/2021   upper lid   CATARACT EXTRACTION Right 07/21/2018   CHOLECYSTECTOMY     laparoscopic '92   CORONARY ARTERY BYPASS GRAFT     graft '92: LIMA-LAD, SVG - D2,R1, D1   ENTEROSCOPY  08/22/2012   Procedure: ENTEROSCOPY;  Surgeon: Juanita Craver, MD;  Location: [facility];  Service: Endoscopy;  Laterality: N/A;   INTRAOCULAR LENS EXCHANGE Right 07/21/2018   KNEE SURGERY     MOLE REMOVAL  2001 and 2008   PAROTIDECTOMY Right 06/18/2021   PILONIDAL CYST EXCISION     Social History   Socioeconomic History   Marital status: Widowed    Spouse name: Not on file   Number of children: 1   Years of education: Not on file   Highest education level: Not on file  Occupational History   Occupation: Retired    Fish farm manager: RETIRED  Tobacco Use   Smoking status: Former    Years: 16.00    Types: Cigarettes     Quit date: 08/14/1964    Years since quitting: 57.3   Smokeless tobacco: Never   Tobacco comments:    quit 1965  Vaping Use   Vaping Use: Never used  Substance and Sexual Activity   Alcohol use: Not Currently    Alcohol/week: 7.0 standard drinks    Types: 7 Shots of liquor per week   Drug use: No   Sexual activity: Not on file  Other Topics Concern   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 Married '57- widowed 09-Mar-2023. 1 son -'57- died '92 MVA.    Lives w/ girlfriend     Emergency contact: see DPR, brother and  Daron Offer [phone] (friend)   Social Determinants of Health   Financial Resource Strain: Not on file  Food Insecurity: No Food Insecurity   Worried About Charity fundraiser in the Last Year: Never true   Arboriculturist in the Last Year: Never true  Transportation Needs: No Transportation Needs   Lack of Transportation (Medical): No   Lack of Transportation (Non-Medical): No  Physical Activity:  Sufficiently Active   Days of Exercise per Week: 6 days   Minutes of Exercise per Session: 30 min  Stress: No Stress Concern Present   Feeling of Stress : Not at all  Social Connections: Socially Isolated   Frequency of Communication with Friends and Family: More than three times a week   Frequency of Social Gatherings with Friends and Family: More than three times a week   Attends Religious Services: Never   Marine scientist or Organizations: No   Attends Archivist Meetings: Never   Marital Status: Widowed  Intimate Partner Violence: Not At Risk   Fear of Current or Ex-Partner: No   Emotionally Abused: No   Physically Abused: No   Sexually Abused: No    Allergies as of 11/21/2021   No Known Allergies      Medication List        Accurate as of November 21, 2021 11:59 PM. If you have any questions, ask your nurse or doctor.          STOP taking these medications    dapagliflozin propanediol 5 MG  Tabs tablet Commonly known as: FARXIGA Stopped by: Kathlene November, MD       TAKE these medications    apixaban 2.5 MG Tabs tablet Commonly known as: ELIQUIS Take 1 tablet (2.5 mg total) by mouth 2 (two) times daily.   atorvastatin 80 MG tablet Commonly known as: LIPITOR Take 0.5 tablets (40 mg total) by mouth daily.   benazepril 20 MG tablet Commonly known as: LOTENSIN Take 1 tablet (20 mg total) by mouth daily.   CoQ10 100 MG Caps Take 1 capsule by mouth daily.   diphenhydramine-acetaminophen 25-500 MG Tabs tablet Commonly known as: TYLENOL PM Take 2 tablets by mouth at bedtime.   furosemide 40 MG tablet Commonly known as: LASIX TAKE 1 TABLET(40 MG) BY MOUTH DAILY   Melatonin 10 MG Tabs Take 10 mg by mouth at bedtime.   metoprolol succinate 25 MG 24 hr tablet Commonly known as: TOPROL-XL TAKE 1 TABLET(25 MG) BY MOUTH DAILY WITH OR IMMEDIATELY FOLLOWING A MEAL   multivitamin with minerals Tabs tablet Take 1 tablet by mouth daily.   pantoprazole 40 MG tablet Commonly known as: PROTONIX Take 1 tablet (40 mg total) by mouth daily.   Rybelsus 3 MG Tabs Generic drug: Semaglutide Take 3 mg by mouth daily. Started by: Kathlene November, MD   tamsulosin 0.4 MG Caps capsule Commonly known as: FLOMAX TAKE 1 CAPSULE(0.4 MG) BY MOUTH DAILY           Objective:   Physical Exam BP 126/62 (BP Location: Left Arm, Patient Position: Sitting, Cuff Size: Normal)    Pulse 62    Temp 97.7 F (36.5 C) (Oral)    Resp 16    Ht 5\' 11"  (1.803 m)    Wt 210 lb 4 oz (95.4 kg)    SpO2 96%    BMI 29.32 kg/m  General: Well developed, NAD, BMI noted HEENT:  Normocephalic . Face symmetric, atraumatic.  Changes consistent with recent blepharoplasty noted. Lungs:  CTA B Normal respiratory effort, no intercostal retractions, no accessory muscle use. Heart: bradycardic, irregular,  no murmur.  Abdomen:  Not distended, soft, non-tender. No rebound or rigidity.   Lower extremities: Trace pretibial  edema Skin: Exposed areas without rash. Not pale. Not jaundice Neurologic:  alert & oriented X3.  Speech normal, gait appropriate for age and unassisted Strength symmetric and appropriate for age.  Psych:  Cognition and judgment appear intact.  Cooperative with normal attention span and concentration.  Behavior appropriate. No anxious or depressed appearing.     Assessment    Assessment  DM: diet control, no neuropathy HTN Hyperlipidemia CRI  CV: --CAD, CABG 1993, Myoview 2009 no ischemia. --Atrial fibrillation -- rate control, xarelto -- chronic combined systolic/diastolic congestive heart failure Venous insufficiency, mild LE edema L>>R  LUTS    MSK: DJD, spinal stenosis, R wrist Fx in the 80s GI: --Recurrent melena: Work-up 2013 Dr. Sharlett Iles: Colonoscopy, EGD, capsule endoscopy.  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  No recent ambulatory CBGs.  We talk about alternatives including Rybelsus, glipizide etc.  (Metformin and Actos: We will have to be cautious because kidney function and h/o CHF).  Although he knows it will be expensive he would like to try and Rybelsus 3 mg daily.  Patient cannot check ambulatory CBGs. HTN: Seems controlled.  Continue Lasix, Lotensin, metoprolol. High cholesterol: On Lipitor, last FLP satisfactory. CAD, A FIb: asx RTC 3 months.  This visit occurred during the SARS-CoV-2 public health emergency.  Safety protocols were in place, including screening questions prior to the visit, additional usage of staff PPE, and extensive cleaning of exam room while observing appropriate contact time as indicated for disinfecting solutions.

## 2021-11-21 NOTE — Patient Instructions (Addendum)
Per our records you are due for your diabetic eye exam. Please contact your eye doctor to schedule an appointment. Please have them send copies of your office visit notes to [place]. Our fax number is [phone]. If you need a referral to an eye doctor please let us know.   Stop [employer] Rybelsus 3 mg 1 tablet in the morning.     GO TO THE LAB : Get the blood work     [place], [place] back for a checkup in 3 months

## 2021-11-22 ENCOUNTER — Encounter: Payer: Self-pay | Admitting: Internal Medicine

## 2021-11-22 NOTE — Assessment & Plan Note (Signed)
--  Td 2013 - PNM 23: 2013; booster today. - PNM 13 :2015  - zostavax 2016; s/p shingrex  -COVID vaccines bivalent booster rec - had a flu shot per pt --Cscope 02-2012 (-), no further screen per GI letter  --Prostate cancer screening: no further screening. -Advance directives: Package of information provided. --Labs: CMP, A1c, TSH

## 2021-11-22 NOTE — Assessment & Plan Note (Signed)
Here for CPX DM: Last A1c was 7.2, Farxiga recommended, it is very expensive and is causing constipation.  No recent ambulatory CBGs.  We talk about alternatives including Rybelsus, glipizide etc.  (Metformin and Actos: We will have to be cautious because kidney function and h/o CHF).  Although he knows it will be expensive he would like to try and Rybelsus 3 mg daily.  Patient cannot check ambulatory CBGs. HTN: Seems controlled.  Continue Lasix, Lotensin, metoprolol. High cholesterol: On Lipitor, last FLP satisfactory. CAD, A FIb: asx RTC 3 months.

## 2021-11-27 ENCOUNTER — Encounter: Payer: Self-pay | Admitting: Internal Medicine

## 2021-11-28 MED ORDER — DAPAGLIFLOZIN PROPANEDIOL 5 MG PO TABS: 5.0000 mg | ORAL_TABLET | Freq: Every day | ORAL | 1 refills | Status: DC

## 2021-12-12 ENCOUNTER — Other Ambulatory Visit: Payer: Self-pay | Admitting: Cardiology

## 2021-12-12 DIAGNOSIS — I5042 Chronic combined systolic (congestive) and diastolic (congestive) heart failure: Secondary | ICD-10-CM

## 2021-12-17 ENCOUNTER — Ambulatory Visit (INDEPENDENT_AMBULATORY_CARE_PROVIDER_SITE_OTHER): Payer: Medicare Other | Admitting: Sports Medicine

## 2021-12-17 ENCOUNTER — Other Ambulatory Visit: Payer: Self-pay

## 2021-12-17 ENCOUNTER — Ambulatory Visit (INDEPENDENT_AMBULATORY_CARE_PROVIDER_SITE_OTHER): Payer: Medicare Other

## 2021-12-17 DIAGNOSIS — R590 Localized enlarged lymph nodes: Secondary | ICD-10-CM

## 2021-12-17 DIAGNOSIS — M7989 Other specified soft tissue disorders: Secondary | ICD-10-CM | POA: Insufficient documentation

## 2021-12-17 DIAGNOSIS — Z0389 Encounter for observation for other suspected diseases and conditions ruled out: Secondary | ICD-10-CM | POA: Diagnosis not present

## 2021-12-17 DIAGNOSIS — M47816 Spondylosis without myelopathy or radiculopathy, lumbar region: Secondary | ICD-10-CM

## 2021-12-17 MED ORDER — PREDNISONE 50 MG PO TABS: ORAL_TABLET | ORAL | 0 refills | Status: DC

## 2021-12-17 MED ORDER — GABAPENTIN 300 MG PO CAPS: ORAL_CAPSULE | ORAL | 3 refills | Status: DC

## 2021-12-17 NOTE — Assessment & Plan Note (Signed)
[name] does have chronic low back pain, he has an MRI with severe spinal stenosis at L4-L5 with severe multilevel facet arthritis. He never ended up having his spinal injections. We had referred him to Dr. Francesco Runner. He does have pain in the back of the right thigh, back of the calf. We will first rule out a DVT, I would also like to try 5 days of prednisone, gabapentin, if insufficient improvement after a month we will proceed again with epidurals followed by facet injections if insufficient improvement.

## 2021-12-17 NOTE — Assessment & Plan Note (Signed)
There does appear to be an abnormal lymph node in the groin, no DVT however, we do need to reimage this lymph node in about a month to ensure that it goes away, if not he will need a biopsy.

## 2021-12-17 NOTE — Progress Notes (Addendum)
° ° °  Procedures performed today:    None.  Independent interpretation of notes and tests performed by another provider:   None.  Brief History, Exam, Impression, and Recommendations:    Right leg swelling Increasing pain, right leg swelling, pain at the musculotendinous junction of the gastrocnemius, no trauma, positive Homans' sign. Adding DVT ultrasound.  Lumbar spondylosis [name] does have chronic low back pain, he has an MRI with severe spinal stenosis at L4-L5 with severe multilevel facet arthritis. He never ended up having his spinal injections. We had referred him to Dr. Francesco Runner. He does have pain in the back of the right thigh, back of the calf. We will first rule out a DVT, I would also like to try 5 days of prednisone, gabapentin, if insufficient improvement after a month we will proceed again with epidurals followed by facet injections if insufficient improvement.  Lymphadenopathy, inguinal, right There does appear to be an abnormal lymph node in the groin, no DVT however, we do need to reimage this lymph node in about a month to ensure that it goes away, if not he will need a biopsy.  Chronic process with exacerbation and pharmacologic intervention  ___________________________________________ Gwen Her. Dianah Field, M.D., ABFM., CAQSM. Primary Care and [place] Instructor of [employer] [facility] [employer]

## 2021-12-17 NOTE — Addendum Note (Signed)
Addended by: Silverio Decamp on: 12/17/2021 04:39 PM   Modules accepted: Orders

## 2021-12-17 NOTE — Assessment & Plan Note (Signed)
Increasing pain, right leg swelling, pain at the musculotendinous junction of the gastrocnemius, no trauma, positive Homans' sign. Adding DVT ultrasound.

## 2021-12-18 ENCOUNTER — Telehealth: Payer: Self-pay | Admitting: Internal Medicine

## 2021-12-18 NOTE — Telephone Encounter (Signed)
Pt spouse contacted ov regarding pt. Spouse stated pt may have a repeat of cancer and would like directions on what to do moving forward. Pt spouse is very concerned. Please advise.

## 2021-12-18 NOTE — Telephone Encounter (Signed)
Please schedule an appt

## 2021-12-18 NOTE — Telephone Encounter (Signed)
Pt scheduled 1/13 @ 4

## 2021-12-19 ENCOUNTER — Encounter: Payer: Self-pay | Admitting: Internal Medicine

## 2021-12-21 ENCOUNTER — Encounter: Payer: Self-pay | Admitting: Internal Medicine

## 2021-12-21 ENCOUNTER — Ambulatory Visit (INDEPENDENT_AMBULATORY_CARE_PROVIDER_SITE_OTHER): Payer: Medicare Other | Admitting: Internal Medicine

## 2021-12-21 VITALS — BP 126/84 | HR 70 | Temp 97.7°F | Resp 18 | Ht 71.0 in | Wt 208.2 lb

## 2021-12-21 DIAGNOSIS — R591 Generalized enlarged lymph nodes: Secondary | ICD-10-CM

## 2021-12-21 LAB — CBC WITH DIFFERENTIAL/PLATELET
Absolute Monocytes: 230 cells/uL (ref 200–950)
Basophils Absolute: 17 cells/uL (ref 0–200)
Basophils Relative: 0.2 %
Eosinophils Absolute: 9 cells/uL — ABNORMAL LOW (ref 15–500)
Eosinophils Relative: 0.1 %
HCT: 47.7 % (ref 38.5–50.0)
Hemoglobin: 16.2 g/dL (ref 13.2–17.1)
Lymphs Abs: 833 cells/uL — ABNORMAL LOW (ref 850–3900)
MCH: 32.3 pg (ref 27.0–33.0)
MCHC: 34 g/dL (ref 32.0–36.0)
MCV: 95.2 fL (ref 80.0–100.0)
MPV: 10.8 fL (ref 7.5–12.5)
Monocytes Relative: 2.7 %
Neutro Abs: 7412 cells/uL (ref 1500–7800)
Neutrophils Relative %: 87.2 %
Platelets: 187 10*3/uL (ref 140–400)
RBC: 5.01 10*6/uL (ref 4.20–5.80)
RDW: 13.9 % (ref 11.0–15.0)
Total Lymphocyte: 9.8 %
WBC: 8.5 10*3/uL (ref 3.8–10.8)

## 2021-12-21 NOTE — Progress Notes (Signed)
Subjective:    Patient ID: [name], male    DOB: Dec 14, 1930, 88 y.o.   MRN: [id number]  DOS:  12/21/2021 Type of visit - description: Acute, here with Shirlee Limerick his partner  Since the last visit, went to see sports medicine due to right leg swelling. Part of the evaluation included a ultrasound to rule out DVT, report reviewed: Neg for  DVT. Has a 2.1 cm soft tissue on the L inguinal region. Architecture was distorted, etiology could be infectious or possibly malignancy which cannot be ruled out. The patient is quite concerned about the report comes here for further advice   Wt Readings from Last 3 Encounters:  12/21/21 208 lb 4 oz (94.5 kg)  11/21/21 210 lb 4 oz (95.4 kg)  08/15/21 218 lb (98.9 kg)    Review of Systems the patient reports no fever chills. No night sweats Self palpation of the groins or other areas showed no lumps or lymphadenopathy that he can tell. Denies any GI symptoms such as nausea vomiting or feeling bloated however he does have constipation. Some weight loss noted, reportedly had a hard time eating after he had radiation on the neck.   Past Medical History:  Diagnosis Date   Anemia    Antral gastritis 2013   EGD    Atrial fibrillation ([place])    CAD (coronary artery disease)    s/p CABG 1993 (L-LAD, S-RI, S-D1);   echo 1/10: EF 55%;    myoview 12/09: inf MI, no ischemia   Candida esophagitis ([place]) 2013   EGD    Cataracts, bilateral    Family history of malignant neoplasm of gastrointestinal tract    Folliculitis    Hiatal hernia    HTN (hypertension)    Hyperlipidemia    Irritable bladder    Myocardial infarction [facility])    Obesity    Osteoarthritis     Past Surgical History:  Procedure Laterality Date   BLEPHAROPLASTY Bilateral 11/16/2021   upper lid   CATARACT EXTRACTION Right 07/21/2018   CHOLECYSTECTOMY     laparoscopic '92   CORONARY ARTERY BYPASS GRAFT     graft '92: LIMA-LAD, SVG - D2,R1, D1   ENTEROSCOPY  08/22/2012    Procedure: ENTEROSCOPY;  Surgeon: Juanita Craver, MD;  Location: [facility];  Service: Endoscopy;  Laterality: N/A;   INTRAOCULAR LENS EXCHANGE Right 07/21/2018   KNEE SURGERY     MOLE REMOVAL  2001 and 2008   PAROTIDECTOMY Right 06/18/2021   PILONIDAL CYST EXCISION      Current Outpatient Medications  Medication Instructions   apixaban (ELIQUIS) 2.5 mg, Oral, 2 times daily   atorvastatin (LIPITOR) 40 mg, Oral, Daily   benazepril (LOTENSIN) 20 mg, Oral, Daily   Coenzyme Q10 (COQ10) 100 MG CAPS 1 capsule, Oral, Daily   dapagliflozin propanediol (FARXIGA) 5 mg, Oral, Daily   diphenhydramine-acetaminophen (TYLENOL PM) 25-500 MG TABS tablet 2 tablets, Oral, Daily at bedtime   furosemide (LASIX) 40 MG tablet TAKE 1 TABLET(40 MG) BY MOUTH DAILY   gabapentin (NEURONTIN) 300 MG capsule One tab PO qHS for a week, then BID for a week, then TID. May double weekly to a max of 3,600mg /day   Melatonin 10 mg, Oral, Daily at bedtime   metoprolol succinate (TOPROL-XL) 25 MG 24 hr tablet TAKE 1 TABLET(25 MG) BY MOUTH DAILY WITH OR IMMEDIATELY FOLLOWING A MEAL   Multiple Vitamin (MULTIVITAMIN WITH MINERALS) TABS tablet 1 tablet, Oral, Daily   pantoprazole (PROTONIX) 40 mg, Oral, Daily  predniSONE (DELTASONE) 50 MG tablet One tab PO daily for 5 days.   tamsulosin (FLOMAX) 0.4 MG CAPS capsule TAKE 1 CAPSULE(0.4 MG) BY MOUTH DAILY       Objective:   Physical Exam BP 126/84 (BP Location: Left Arm, Patient Position: Sitting, Cuff Size: Normal)    Pulse 70    Temp 97.7 F (36.5 C) (Oral)    Resp 18    Ht 5\' 11"  (1.803 m)    Wt 208 lb 4 oz (94.5 kg)    SpO2 96%    BMI 29.04 kg/m  General:   Well developed, NAD, BMI noted. HEENT:  Normocephalic . Face symmetric, atraumatic Lymphatic system: Neck: Architecture is distorted by recent surgery and radiation therapy however I do not feel any pathological labs. Axillary areas: About 1 cm lymph node on the L axillary area, mobile, no hard. Groins: R side  normal, good femoral pulses.  L side question of a 1 cm, soft, mobile LAD. GU: No rash, foreskin without lesions or ulcers.  Scrotal contents normal. v Skin: Not pale. Not jaundice Neurologic:  alert & oriented X3.  Speech normal, gait appropriate for age and unassisted Psych--  Cognition and judgment appear intact.  Cooperative with normal attention span and concentration.  Behavior appropriate. No anxious or depressed appearing.      Assessment      Assessment  DM: diet control, no neuropathy HTN Hyperlipidemia CRI  CV: --CAD, CABG 1993, Myoview 2009 no ischemia. --Atrial fibrillation -- rate control, xarelto -- chronic combined systolic/diastolic congestive heart failure Venous insufficiency, mild LE edema L>>R  LUTS    MSK: DJD, spinal stenosis, R wrist Fx in the 80s GI: --Recurrent melena: Work-up 2013 Dr. Sharlett Iles: Colonoscopy, EGD, capsule endoscopy.  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    (Addendum, d/w  radiology on-call today 12/24/2021, the lymph node is enlarged but concern 3 for malignancy is very small.  He review CT abdomen and pelvis from 2021 and even then there was similar size lymph node on the left groin which is reassuring.). The patient is concerned because he had a right parotid gland metastatic SCC and wondered about cancer. ROS is essentially negative. Patient would like blood work, at this point the most useful test could be CBC with pathology review which is sent. Also will repeat ultrasound of the area in 1 month. Constipation: Reports difficulty with constipation sometimes, recommend MiraLAX as needed   This visit occurred during the SARS-CoV-2 public health emergency.  Safety protocols were in place, including screening  questions prior to the visit, additional usage of staff PPE, and extensive cleaning of exam room while observing appropriate contact time as indicated for disinfecting solutions.

## 2021-12-21 NOTE — Patient Instructions (Addendum)
Proceed with blood work today.  Please call me in 3 to 4 weeks, we will set up another ultrasound   If you develop any fever, chills, night sweats or you notice any lumps anywhere please call

## 2021-12-24 ENCOUNTER — Encounter: Payer: Self-pay | Admitting: Internal Medicine

## 2021-12-24 NOTE — Assessment & Plan Note (Signed)
LEFT Groin lymphadenopathy: Found during ultrasound to rule out R leg DVT.    (Addendum, d/w  radiology on-call today 12/24/2021, the lymph node is enlarged but concern 3 for malignancy is very small.  He review CT abdomen and pelvis from 2021 and even then there was similar size lymph node on the left groin which is reassuring.). The patient is concerned because he had a right parotid gland metastatic SCC and wondered about cancer. ROS is essentially negative. Patient would like blood work, at this point the most useful test could be CBC with pathology review which is sent. Also will repeat ultrasound of the area in 1 month. Constipation: Reports difficulty with constipation sometimes, recommend MiraLAX as needed

## 2021-12-31 DIAGNOSIS — C44320 Squamous cell carcinoma of skin of unspecified parts of face: Secondary | ICD-10-CM | POA: Diagnosis not present

## 2022-01-01 ENCOUNTER — Encounter: Payer: Self-pay | Admitting: Cardiology

## 2022-01-01 ENCOUNTER — Encounter: Payer: Self-pay | Admitting: Sports Medicine

## 2022-01-01 MED ORDER — ATORVASTATIN CALCIUM 40 MG PO TABS: 40.0000 mg | ORAL_TABLET | Freq: Every day | ORAL | 2 refills | Status: DC

## 2022-01-02 DIAGNOSIS — H903 Sensorineural hearing loss, bilateral: Secondary | ICD-10-CM | POA: Diagnosis not present

## 2022-01-02 DIAGNOSIS — H6121 Impacted cerumen, right ear: Secondary | ICD-10-CM | POA: Diagnosis not present

## 2022-01-02 DIAGNOSIS — C7989 Secondary malignant neoplasm of other specified sites: Secondary | ICD-10-CM | POA: Diagnosis not present

## 2022-01-07 ENCOUNTER — Encounter: Payer: Self-pay | Admitting: Internal Medicine

## 2022-01-07 ENCOUNTER — Ambulatory Visit (HOSPITAL_BASED_OUTPATIENT_CLINIC_OR_DEPARTMENT_OTHER)
Admission: RE | Admit: 2022-01-07 | Discharge: 2022-01-07 | Disposition: A | Discharge status: Home / Self Care | Payer: Medicare Other | Source: Ambulatory Visit | Attending: Internal Medicine | Admitting: Internal Medicine

## 2022-01-07 ENCOUNTER — Other Ambulatory Visit: Payer: Self-pay

## 2022-01-07 ENCOUNTER — Ambulatory Visit (INDEPENDENT_AMBULATORY_CARE_PROVIDER_SITE_OTHER): Payer: Medicare Other | Admitting: Internal Medicine

## 2022-01-07 VITALS — BP 126/80 | HR 40 | Temp 97.9°F | Resp 18 | Ht 71.0 in | Wt 207.1 lb

## 2022-01-07 DIAGNOSIS — R221 Localized swelling, mass and lump, neck: Secondary | ICD-10-CM | POA: Diagnosis not present

## 2022-01-07 DIAGNOSIS — R591 Generalized enlarged lymph nodes: Secondary | ICD-10-CM | POA: Diagnosis not present

## 2022-01-07 NOTE — Patient Instructions (Signed)
Please go to the lab, we will get some blood work.  Please go downstairs and schedule a ultrasound of your left groin and the neck.   They can be done at the same time in the near future (by 01/16/2022 or  after)

## 2022-01-07 NOTE — Progress Notes (Signed)
Subjective:    Patient ID: [name], male    DOB: [birth year], 88 y.o.   MRN: [id number]  DOS:  01/07/2022 Type of visit - description: f/u  See last visit, was seen for a L groin lymph node. On self-examination of the area, he does not feel anything lumps and  is unchanged from previous weeks. He reports a new issue: a  nontender lump at the neck, noted few weeks ago.  Review of Systems  Denies fever chills. No weight loss. No night sweats  Past Medical History:  Diagnosis Date   Anemia    Antral gastritis 2013   EGD    Atrial fibrillation ([place])    CAD (coronary artery disease)    s/p CABG 1993 (L-LAD, S-RI, S-D1);   echo 1/10: EF 55%;    myoview 12/09: inf MI, no ischemia   Candida esophagitis ([place]) 2013   EGD    Cataracts, bilateral    Family history of malignant neoplasm of gastrointestinal tract    Folliculitis    Hiatal hernia    HTN (hypertension)    Hyperlipidemia    Irritable bladder    Myocardial infarction [facility])    Obesity    Osteoarthritis     Past Surgical History:  Procedure Laterality Date   BLEPHAROPLASTY Bilateral 11/16/2021   upper lid   CATARACT EXTRACTION Right 07/21/2018   CHOLECYSTECTOMY     laparoscopic '92   CORONARY ARTERY BYPASS GRAFT     graft '92: LIMA-LAD, SVG - D2,R1, D1   ENTEROSCOPY  08/22/2012   Procedure: ENTEROSCOPY;  Surgeon: Juanita Craver, MD;  [facility];  Service: Endoscopy;  Laterality: N/A;   INTRAOCULAR LENS EXCHANGE Right 07/21/2018   KNEE SURGERY     MOLE REMOVAL  2001 and 2008   PAROTIDECTOMY Right 06/18/2021   PILONIDAL CYST EXCISION      Current Outpatient Medications  Medication Instructions   apixaban (ELIQUIS) 2.5 mg, Oral, 2 times daily   atorvastatin (LIPITOR) 40 mg, Oral, Daily   benazepril (LOTENSIN) 20 mg, Oral, Daily   Coenzyme Q10 (COQ10) 100 MG CAPS 1 capsule, Oral, Daily   dapagliflozin propanediol (FARXIGA) 5 mg, Oral, Daily   diphenhydramine-acetaminophen (TYLENOL PM) 25-500 MG  TABS tablet 2 tablets, Oral, Daily at bedtime   furosemide (LASIX) 40 MG tablet TAKE 1 TABLET(40 MG) BY MOUTH DAILY   gabapentin (NEURONTIN) 300 MG capsule One tab PO qHS for a week, then BID for a week, then TID. May double weekly to a max of 3,600mg /day   Melatonin 10 mg, Oral, Daily at bedtime   metoprolol succinate (TOPROL-XL) 25 MG 24 hr tablet TAKE 1 TABLET(25 MG) BY MOUTH DAILY WITH OR IMMEDIATELY FOLLOWING A MEAL   Multiple Vitamin (MULTIVITAMIN WITH MINERALS) TABS tablet 1 tablet, Oral, Daily   pantoprazole (PROTONIX) 40 mg, Oral, Daily   predniSONE (DELTASONE) 50 MG tablet One tab PO daily for 5 days.   tamsulosin (FLOMAX) 0.4 MG CAPS capsule TAKE 1 CAPSULE(0.4 MG) BY MOUTH DAILY       Objective:   Physical Exam Neck:      Comments: Has a soft, mobile, nontender, no fluctuant lump, not attached to deeper or superficial structures.  BP 126/80 (BP Location: Left Arm, Patient Position: Sitting, Cuff Size: Small)    Pulse (!) 40    Temp 97.9 F (36.6 C) (Oral)    Resp 18    Ht 5\' 11"  (1.803 m)    Wt 207 lb 2 oz (94 kg)  SpO2 97%    BMI 28.89 kg/m  General:   Well developed, NAD, BMI noted. HEENT:  Normocephalic . Face symmetric, atraumatic Neck: No thyromegaly.  Has a new lump, see graphic.  See graphic Lymphatic system: Today I feel no LADs on either axillary area Again I feel a one cm, soft mobile LAD at the L groin Lower extremities: no pretibial edema bilaterally  Skin: Not pale. Not jaundice Neurologic:  alert & oriented X3.  Speech normal, gait appropriate for age and unassisted Psych--  Cognition and judgment appear intact.  Cooperative with normal attention span and concentration.  Behavior appropriate. No anxious or depressed appearing.      Assessment      Assessment  DM: diet control, no neuropathy HTN Hyperlipidemia CRI  CV: --CAD, CABG 1993, Myoview 2009 no ischemia. --Atrial fibrillation -- rate control, xarelto -- chronic combined  systolic/diastolic congestive heart failure Venous insufficiency, mild LE edema L>>R  LUTS    MSK: DJD, spinal stenosis, R wrist Fx in the 80s GI: --Recurrent melena: Work-up 2013 Dr. Sharlett Iles: Colonoscopy, EGD, capsule endoscopy.  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 I did talk with radiology at the time, suspicious for this lymph node being malignant was very low, on reviewing a CT abdomen and pelvis from 2021, there was already a lymph node on that area. Exam today is benign. Plan: W  blood smear.  Schedule [place] L groin follow-up LAD Lump, neck: This is a new problem, benign on clinical grounds, will check ultrasound    This visit occurred during the SARS-CoV-2 public health emergency.  Safety protocols were in place, including screening questions prior to the visit, additional usage of staff PPE, and extensive cleaning of exam room while observing appropriate contact time as indicated for disinfecting solutions.

## 2022-01-07 NOTE — Assessment & Plan Note (Signed)
L groin lymphadenopathy: See last visit, CBC normal, blood smear was not done. I did talk with radiology at the time, suspicious for this lymph node being malignant was very low, on reviewing a CT abdomen and pelvis from 2021, there was already a lymph node on that area. Exam today is benign. Plan: W  blood smear.  Schedule [place] L groin follow-up LAD Lump, neck: This is a new problem, benign on clinical grounds, will check ultrasound

## 2022-01-08 LAB — PATHOLOGIST SMEAR REVIEW

## 2022-01-08 LAB — CBC WITH DIFFERENTIAL/PLATELET
Absolute Monocytes: 547 cells/uL (ref 200–950)
Basophils Absolute: 17 cells/uL (ref 0–200)
Basophils Relative: 0.3 %
Eosinophils Absolute: 137 cells/uL (ref 15–500)
Eosinophils Relative: 2.4 %
HCT: 47.6 % (ref 38.5–50.0)
Hemoglobin: 16.1 g/dL (ref 13.2–17.1)
Lymphs Abs: 1454 cells/uL (ref 850–3900)
MCH: 32.3 pg (ref 27.0–33.0)
MCHC: 33.8 g/dL (ref 32.0–36.0)
MCV: 95.4 fL (ref 80.0–100.0)
MPV: 10.8 fL (ref 7.5–12.5)
Monocytes Relative: 9.6 %
Neutro Abs: 3545 cells/uL (ref 1500–7800)
Neutrophils Relative %: 62.2 %
Platelets: 162 10*3/uL (ref 140–400)
RBC: 4.99 10*6/uL (ref 4.20–5.80)
RDW: 13.7 % (ref 11.0–15.0)
Total Lymphocyte: 25.5 %
WBC: 5.7 10*3/uL (ref 3.8–10.8)

## 2022-01-09 ENCOUNTER — Other Ambulatory Visit: Payer: Self-pay

## 2022-01-09 ENCOUNTER — Ambulatory Visit (HOSPITAL_COMMUNITY)
Admission: RE | Admit: 2022-01-09 | Discharge: 2022-01-09 | Disposition: A | Discharge status: Home / Self Care | Payer: Medicare Other | Source: Ambulatory Visit | Attending: Cardiovascular Disease | Admitting: Cardiovascular Disease

## 2022-01-09 ENCOUNTER — Telehealth: Payer: Self-pay

## 2022-01-09 ENCOUNTER — Encounter (HOSPITAL_COMMUNITY): Payer: Self-pay

## 2022-01-09 DIAGNOSIS — R591 Generalized enlarged lymph nodes: Secondary | ICD-10-CM

## 2022-01-09 NOTE — Telephone Encounter (Signed)
Benefits Investigation Details received from [employer] Injection: Orthovisc  Medical: Deductible does not apply. Once the OOP has been met, patient is covered at 100%. Prior Authorization is required for ht drug through unitedhealthcare. To initiate, call [phone] PA required: Yes Spoke with [name], who stated that orthovisc is non-preferred. Authorization done for synvisc which is preferred agent.   Pharmacy: The product is not covered under the pharmacy plan.   Specialty Pharmacy: [facility]  May fill through: [place] Copay/Coinsurance:  Product Copay: 20% Administration Coinsurance:  Administration Copay: $35 Deductible: None Out of Pocket Max: $4900 (met: $80)

## 2022-01-09 NOTE — Telephone Encounter (Signed)
Received call from [facility] at vascular office regarding ultrasound order for groin. They are unable to do because we are not looking for pseudoaneursym. She informed that order would be for soft tissue of groin. PCP did not think entire lower extremity was necessary however there is not an order for just soft tissue groin. I made PCP aware of this. Okay to leave as ordered by Dr. Dianah Field. Pt is already scheduled on 01/16/22 for this.

## 2022-01-14 ENCOUNTER — Ambulatory Visit: Payer: Medicare Other | Admitting: Sports Medicine

## 2022-01-14 DIAGNOSIS — C44320 Squamous cell carcinoma of skin of unspecified parts of face: Secondary | ICD-10-CM | POA: Diagnosis not present

## 2022-01-15 ENCOUNTER — Other Ambulatory Visit: Payer: Self-pay | Admitting: Internal Medicine

## 2022-01-15 NOTE — Telephone Encounter (Signed)
Patient scheduled by Anderson Malta

## 2022-01-15 NOTE — Telephone Encounter (Signed)
Back to Cristal, can we schedule him?

## 2022-01-16 ENCOUNTER — Other Ambulatory Visit: Payer: Medicare Other

## 2022-01-16 ENCOUNTER — Other Ambulatory Visit: Payer: Self-pay

## 2022-01-16 DIAGNOSIS — M1711 Unilateral primary osteoarthritis, right knee: Secondary | ICD-10-CM

## 2022-01-16 NOTE — Progress Notes (Signed)
error 

## 2022-01-17 MED ORDER — SYNVISC 16 MG/2ML IX SOSY: PREFILLED_SYRINGE | INTRA_ARTICULAR | 1 refills | Status: DC

## 2022-01-18 NOTE — Telephone Encounter (Signed)
Fax received from [facility] re: synvisc. Form completed and returned; conformation obtained.

## 2022-01-21 ENCOUNTER — Ambulatory Visit: Payer: Medicare Other | Admitting: Sports Medicine

## 2022-01-22 ENCOUNTER — Encounter: Payer: Self-pay | Admitting: Internal Medicine

## 2022-01-23 NOTE — Telephone Encounter (Signed)
Call to [facility] to check the status of the Synvisc shipment. Spoke with Dedra Skeens, she stated that she needed to reach out to the patient for consent and payment. Patient hung up on [place]. I contacted patient and explained the process and gave him the contact info for [facility]. He verbalized understanding

## 2022-01-24 DIAGNOSIS — H903 Sensorineural hearing loss, bilateral: Secondary | ICD-10-CM | POA: Diagnosis not present

## 2022-01-28 NOTE — Telephone Encounter (Signed)
Patient will call back and ask for me to get these appt's scheduled. AM

## 2022-01-28 NOTE — Telephone Encounter (Signed)
Medication rec'd in the office today. Please call patient and schedule for once weekly injections for 3 weeks.

## 2022-02-04 ENCOUNTER — Ambulatory Visit (INDEPENDENT_AMBULATORY_CARE_PROVIDER_SITE_OTHER): Payer: Medicare Other | Admitting: Sports Medicine

## 2022-02-04 ENCOUNTER — Other Ambulatory Visit: Payer: Self-pay

## 2022-02-04 ENCOUNTER — Encounter: Payer: Self-pay | Admitting: Internal Medicine

## 2022-02-04 ENCOUNTER — Ambulatory Visit (INDEPENDENT_AMBULATORY_CARE_PROVIDER_SITE_OTHER): Payer: Medicare Other

## 2022-02-04 DIAGNOSIS — M1711 Unilateral primary osteoarthritis, right knee: Secondary | ICD-10-CM | POA: Diagnosis not present

## 2022-02-04 NOTE — Assessment & Plan Note (Signed)
Synvisc injection #1 of 3 right knee, return in 1 week for #2.

## 2022-02-04 NOTE — Progress Notes (Signed)
° ° °  Procedures performed today:    Procedure: Real-time Ultrasound Guided injection of the right knee Device: Samsung HS60  Verbal informed consent obtained.  Time-out conducted.  Noted no overlying erythema, induration, or other signs of local infection.  Skin prepped in a sterile fashion.  Local anesthesia: Topical Ethyl chloride.  With sterile technique and under real time ultrasound guidance: Noted mild effusion, 22-gauge needle advanced into the suprapatellar recess, injected 1 syringe of Synvisc. Completed without difficulty  Advised to call if fevers/chills, erythema, induration, drainage, or persistent bleeding.  Images permanently stored and available for review in PACS.  Impression: Technically successful ultrasound guided injection.  Independent interpretation of notes and tests performed by another provider:   None.  Brief History, Exam, Impression, and Recommendations:    Primary osteoarthritis of right knee Synvisc injection #1 of 3 right knee, return in 1 week for #2.    ___________________________________________ Gwen Her. Dianah Field, M.D., ABFM., CAQSM. Primary Care and [place] Instructor of [place] [employer]

## 2022-02-11 ENCOUNTER — Ambulatory Visit (INDEPENDENT_AMBULATORY_CARE_PROVIDER_SITE_OTHER): Payer: Medicare Other

## 2022-02-11 ENCOUNTER — Other Ambulatory Visit: Payer: Self-pay

## 2022-02-11 ENCOUNTER — Ambulatory Visit (INDEPENDENT_AMBULATORY_CARE_PROVIDER_SITE_OTHER): Payer: Medicare Other | Admitting: Sports Medicine

## 2022-02-11 DIAGNOSIS — M1711 Unilateral primary osteoarthritis, right knee: Secondary | ICD-10-CM

## 2022-02-11 NOTE — Progress Notes (Signed)
? ? ?  Procedures performed today:   ? ?Procedure: Real-time Ultrasound Guided injection of the right knee ?Device: Samsung HS60  ?Verbal informed consent obtained.  ?Time-out conducted.  ?Noted no overlying erythema, induration, or other signs of local infection.  ?Skin prepped in a sterile fashion.  ?Local anesthesia: Topical Ethyl chloride.  ?With sterile technique and under real time ultrasound guidance: Noted mild effusion, 22-gauge needle advanced into the suprapatellar recess, injected 1 syringe of Synvisc. ?Completed without difficulty  ?Advised to call if fevers/chills, erythema, induration, drainage, or persistent bleeding.  ?Images permanently stored and available for review in PACS.  ?Impression: Technically successful ultrasound guided injection. ? ?Independent interpretation of notes and tests performed by another provider:  ? ?None. ? ?Brief History, Exam, Impression, and Recommendations:   ? ?Primary osteoarthritis of right knee ?Synvisc No. 2 of 3 right knee, return in a week. ? ? ? ?___________________________________________ ?Gwen Her. Dianah Field, M.D., ABFM., CAQSM. ?Primary Care and Sports Medicine ?[place] ? ?Adjunct Instructor of Family Medicine  ?[employer] ?

## 2022-02-11 NOTE — Assessment & Plan Note (Signed)
Synvisc No. 2 of 3 right knee, return in a week. ?

## 2022-02-18 ENCOUNTER — Ambulatory Visit: Payer: Self-pay

## 2022-02-18 ENCOUNTER — Ambulatory Visit (INDEPENDENT_AMBULATORY_CARE_PROVIDER_SITE_OTHER): Payer: Medicare Other | Admitting: Sports Medicine

## 2022-02-18 ENCOUNTER — Ambulatory Visit (INDEPENDENT_AMBULATORY_CARE_PROVIDER_SITE_OTHER): Payer: Medicare Other

## 2022-02-18 ENCOUNTER — Other Ambulatory Visit: Payer: Self-pay

## 2022-02-18 ENCOUNTER — Other Ambulatory Visit: Payer: Self-pay | Admitting: Internal Medicine

## 2022-02-18 DIAGNOSIS — M1711 Unilateral primary osteoarthritis, right knee: Secondary | ICD-10-CM | POA: Diagnosis not present

## 2022-02-18 NOTE — Progress Notes (Signed)
? ? ?  Procedures performed today:   ? ?Procedure: Real-time Ultrasound Guided injection of the right knee ?Device: Samsung HS60  ?Verbal informed consent obtained.  ?Time-out conducted.  ?Noted no overlying erythema, induration, or other signs of local infection.  ?Skin prepped in a sterile fashion.  ?Local anesthesia: Topical Ethyl chloride.  ?With sterile technique and under real time ultrasound guidance: Noted mild effusion, 22-gauge needle advanced into the suprapatellar recess, injected 1 syringe of Synvisc. ?Completed without difficulty  ?Advised to call if fevers/chills, erythema, induration, drainage, or persistent bleeding.  ?Images permanently stored and available for review in PACS.  ?Impression: Technically successful ultrasound guided injection. ? ?Independent interpretation of notes and tests performed by another provider:  ? ?None. ? ?Brief History, Exam, Impression, and Recommendations:   ? ?Primary osteoarthritis of right knee ?Synvisc No. 3 of 3 right knee, return in 1 month as needed. ? ? ? ?___________________________________________ ?Gwen Her. Dianah Field, M.D., ABFM., CAQSM. ?Primary Care and Sports Medicine ?[place] ? ?Adjunct Instructor of Family Medicine  ?[employer] ?

## 2022-02-18 NOTE — Assessment & Plan Note (Signed)
Synvisc No. 3 of 3 right knee, return in 1 month as needed. ?

## 2022-02-20 ENCOUNTER — Ambulatory Visit: Payer: Medicare Other | Admitting: Internal Medicine

## 2022-02-25 ENCOUNTER — Ambulatory Visit (INDEPENDENT_AMBULATORY_CARE_PROVIDER_SITE_OTHER): Payer: Medicare Other | Admitting: Internal Medicine

## 2022-02-25 ENCOUNTER — Encounter: Payer: Self-pay | Admitting: Internal Medicine

## 2022-02-25 VITALS — BP 136/76 | HR 66 | Temp 98.0°F | Resp 16 | Ht 71.0 in | Wt 208.2 lb

## 2022-02-25 DIAGNOSIS — I10 Essential (primary) hypertension: Secondary | ICD-10-CM

## 2022-02-25 DIAGNOSIS — E1159 Type 2 diabetes mellitus with other circulatory complications: Secondary | ICD-10-CM

## 2022-02-25 DIAGNOSIS — I25810 Atherosclerosis of coronary artery bypass graft(s) without angina pectoris: Secondary | ICD-10-CM | POA: Diagnosis not present

## 2022-02-25 DIAGNOSIS — R221 Localized swelling, mass and lump, neck: Secondary | ICD-10-CM | POA: Diagnosis not present

## 2022-02-25 DIAGNOSIS — G629 Polyneuropathy, unspecified: Secondary | ICD-10-CM | POA: Diagnosis not present

## 2022-02-25 LAB — B12 AND FOLATE PANEL
Folate: >24.2 ng/mL (ref >5.9)
Vitamin B-12: 708 pg/mL (ref 211–911)

## 2022-02-25 LAB — HEMOGLOBIN A1C: Hgb A1c MFr Bld: 6.8 % — ABNORMAL HIGH (ref 4.6–6.5)

## 2022-02-25 MED ORDER — FUROSEMIDE 40 MG PO TABS: ORAL_TABLET | ORAL | 1 refills | Status: DC

## 2022-02-25 NOTE — Progress Notes (Signed)
? ?Subjective:  ? ? Patient ID: [name], male    DOB: 1931-02-26, 88 y.o.   MRN: [id number] ? ?DOS:  02/25/2022 ?Type of visit - description: f/u ? ?Here with his wife. ?Multiple issues discussed. ? ?Few weeks ago went to a chiropractor, Dx with severe neuropathy, offered treatment. ?He denies any uncomfortable paresthesias ? ?Has developed constipation since he started [place].  Symptoms are mild, sometimes goes 2 days without a BM, Dulcolax usually helps. ?Denies nausea vomiting.  No blood in the stools. ? ?BP sometimes in the low side, particularly during and shortly after exercise. ?No chest pain or difficulty breathing.  No palpitations.  No fainty feeling. ?He is able to go to the gym 3 times a week without any problem and also goes to work.  ? ? ?BP Readings from Last 3 Encounters:  ?02/25/22 136/76  ?01/07/22 126/80  ?12/21/21 126/84  ? ? ? ?Review of Systems ?See above  ? ?Past Medical History:  ?Diagnosis Date  ? Anemia   ? Antral gastritis 2013  ? EGD   ? Atrial fibrillation ([place])   ? CAD (coronary artery disease)   ? s/p CABG 1993 (L-LAD, S-RI, S-D1);   echo 1/10: EF 55%;    myoview 12/09: inf MI, no ischemia  ? Candida esophagitis ([place]) 2013  ? EGD   ? Cataracts, bilateral   ? Family history of malignant neoplasm of gastrointestinal tract   ? Folliculitis   ? Hiatal hernia   ? HTN (hypertension)   ? Hyperlipidemia   ? Irritable bladder   ? Myocardial infarction [facility])   ? Obesity   ? Osteoarthritis   ? ? ?Past Surgical History:  ?Procedure Laterality Date  ? BLEPHAROPLASTY Bilateral 11/16/2021  ? upper lid  ? CATARACT EXTRACTION Right 07/21/2018  ? CHOLECYSTECTOMY    ? laparoscopic '92  ? CORONARY ARTERY BYPASS GRAFT    ? graft '92: LIMA-LAD, SVG - D2,R1, D1  ? ENTEROSCOPY  08/22/2012  ? Procedure: ENTEROSCOPY;  Surgeon: Juanita Craver, MD;  Location: [facility];  Service: Endoscopy;  Laterality: N/A;  ? INTRAOCULAR LENS EXCHANGE Right 07/21/2018  ? KNEE SURGERY    ? MOLE REMOVAL  2001 and 2008  ?  PAROTIDECTOMY Right 06/18/2021  ? PILONIDAL CYST EXCISION    ? ? ?Current Outpatient Medications  ?Medication Instructions  ? apixaban (ELIQUIS) 2.5 mg, Oral, 2 times daily  ? atorvastatin (LIPITOR) 40 mg, Oral, Daily  ? benazepril (LOTENSIN) 20 MG tablet TAKE 1 TABLET(20 MG) BY MOUTH DAILY  ? Coenzyme Q10 (COQ10) 100 MG CAPS 1 capsule, Oral, Daily  ? dapagliflozin propanediol (FARXIGA) 5 mg, Oral, Daily  ? diphenhydramine-acetaminophen (TYLENOL PM) 25-500 MG TABS tablet 2 tablets, Oral, Daily at bedtime  ? furosemide (LASIX) 40 MG tablet TAKE 1 TABLET(40 MG) BY MOUTH DAILY  ? gabapentin (NEURONTIN) 300 MG capsule One tab PO qHS for a week, then BID for a week, then TID. May double weekly to a max of 3,'600mg'$ /day  ? Hylan (SYNVISC) 16 MG/2ML SOSY Inject into the right knee weekly x3  ? Melatonin 10 mg, Oral, Daily at bedtime  ? Multiple Vitamin (MULTIVITAMIN WITH MINERALS) TABS tablet 1 tablet, Oral, Daily  ? pantoprazole (PROTONIX) 40 mg, Oral, Daily  ? tamsulosin (FLOMAX) 0.4 MG CAPS capsule TAKE 1 CAPSULE(0.4 MG) BY MOUTH DAILY  ? ? ?   ?Objective:  ? Physical Exam ?Neck:  ? ?   Comments: Location of the neck lump ? ?BP 136/76 (BP Location: Left  Arm, Patient Position: Sitting, Cuff Size: Small)   Pulse 66   Temp 98 ?F (36.7 ?C) (Oral)   Resp 16   Ht '5\' 11"'$  (1.803 m)   Wt 208 lb 4 oz (94.5 kg)   SpO2 94%   BMI 29.04 kg/m?  ?General:   ?Well developed, NAD, BMI noted.  ?HEENT:  ?Normocephalic . Face symmetric, atraumatic ?Neck: There is submandibular area, soft, not hard, not tender or fluctuant lump. ?Lungs:  ?CTA B ?Normal respiratory effort, no intercostal retractions, no accessory muscle use. ?Heart: Irregularly irregular ?Abdomen:  ?Not distended, soft, non-tender. No rebound or rigidity. ?Groins: Today I feel no lymphadenopathy or mass on either side ?Skin: Not pale. Not jaundice ?Lower extremities DM foot exam: ?No edema, good femoral and pedal pulses. ?Nails dystrophic ?Pinprick examination: Only 1  toe on the L foot had decreased sensation. ?Neurologic:  ?alert & oriented X3.  ?Speech normal, gait appropriate for age and unassisted ?Psych--  ?Cognition and judgment appear intact.  ?Cooperative with normal attention span and concentration.  ?Behavior appropriate. ?No anxious or depressed appearing. ? ?   ?Assessment   ? ?  ?Assessment  ?DM: diet control, very mild neuropathy.  06/10/2022 HTN ?Hyperlipidemia ?CRI  ?CV: ?--CAD, CABG 1993, Myoview 2009 no ischemia. ?--Atrial fibrillation -- rate control, xarelto ?-- chronic combined systolic/diastolic congestive heart failure ?Venous insufficiency, mild LE edema L>>R  ?LUTS    ?MSK: DJD, spinal stenosis, R wrist Fx in the 80s ?GI: ?--Recurrent melena: Work-up 2013 Dr. Sharlett Iles: Colonoscopy, EGD, capsule endoscopy.  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 ? ?PLAN:A  ?DM: Check A1c, continue FIran ?Neuropathy: Few weeks ago went to a chiropractor, was told he has severe neuropathy, they offered $7000 treatment to "cure" neuropathy. ?He denies paresthesias, exam with minimal neuropathy.  He is already on gabapentin for a different issue. ?Recommend appropriate feet care, see AVS, otherwise observation.  For completeness we will check a BZ61 folic acid and RPR ?Constipation: Patient thinks this could be a side effect of Farxiga, symptoms are mild, recommend to continue Dulcolax as needed. ?CAD, A-fib: Although his BP sometimes get low he remains asx;  goes to the gym and exercise vigorously (for age) without problems. ?Continue Eliquis, Lotensin, Lasix.  Last BMP okay. ?Lump, neck: ?[place]: ?Ill-defined area of mixed echogenicity in the region of palpable  `concern. No discrete mass visualized. If further assessment is necessary, CT neck with contrast would be the next step. ?Lump persists , still benign features, refer  to ENT for further advice ?L groin lymphadenopathy?  Since the last visit we have been unable to schedule a ultrasound of the area, on today's exam, there is no mass or lymphadenopathy.  Recommend no further eval, just observation.  Patient is in agreement. ?RTC 4 months ?  ? ?This visit occurred during the SARS-CoV-2 public health emergency.  Safety protocols were in place, including screening questions prior to the visit, additional usage of staff PPE, and extensive cleaning of exam room while observing appropriate contact time as indicated for disinfecting solutions.  ? ?

## 2022-02-25 NOTE — Patient Instructions (Addendum)
Check the  blood pressure regularly ?BP GOAL is between 110/65 and  135/85. ?If it is consistently higher or lower, let me know ? ?Please call your ENT for a appointment regards to the lump at your neck ? ?GO TO THE LAB : Get the blood work   ? ? ?[place], [place] ?Come back for a checkup in 4 months ? ? ? ?Diabetes Mellitus and Foot Care ?Foot care is an important part of your health, especially when you have diabetes. Diabetes may cause you to have problems because of poor blood flow (circulation) to your feet and legs, which can cause your skin to: ?Become thinner and drier. ?Break more easily. ?Heal more slowly. ?Peel and crack. ?You may also have nerve damage (neuropathy) in your legs and feet, causing decreased feeling in them. This means that you may not notice minor injuries to your feet that could lead to more serious problems. Noticing and addressing any potential problems early is the best way to prevent future foot problems. ?How to care for your feet ?Foot hygiene ? ?Wash your feet daily with warm water and mild soap. Do not use hot water. Then, pat your feet and the areas between your toes until they are completely dry. Do not soak your feet as this can dry your skin. ?Trim your toenails straight across. Do not dig under them or around the cuticle. File the edges of your nails with an emery board or nail file. ?Apply a moisturizing lotion or petroleum jelly to the skin on your feet and to dry, brittle toenails. Use lotion that does not contain alcohol and is unscented. Do not apply lotion between your toes. ?Shoes and socks ?Wear clean socks or stockings every day. Make sure they are not too tight. Do not wear knee-high stockings since they may decrease blood flow to your legs. ?Wear shoes that fit properly and have enough cushioning. Always look in your shoes before you put them on to be sure there are no objects inside. ?To break in new shoes, wear them for just  a few hours a day. This prevents injuries on your feet. ?Wounds, scrapes, corns, and calluses ? ?Check your feet daily for blisters, cuts, bruises, sores, and redness. If you cannot see the bottom of your feet, use a mirror or ask someone for help. ?Do not cut corns or calluses or try to remove them with medicine. ?If you find a minor scrape, cut, or break in the skin on your feet, keep it and the skin around it clean and dry. You may clean these areas with mild soap and water. Do not clean the area with peroxide, alcohol, or iodine. ?If you have a wound, scrape, corn, or callus on your foot, look at it several times a day to make sure it is healing and not infected. Check for: ?Redness, swelling, or pain. ?Fluid or blood. ?Warmth. ?Pus or a bad smell. ?General tips ?Do not cross your legs. This may decrease blood flow to your feet. ?Do not use heating pads or hot water bottles on your feet. They may burn your skin. If you have lost feeling in your feet or legs, you may not know this is happening until it is too late. ?Protect your feet from hot and cold by wearing shoes, such as at the beach or on hot pavement. ?Schedule a complete foot exam at least once a year (annually) or more often if you have foot problems. Report  any cuts, sores, or bruises to your health care provider immediately. ?Where to find more information ?American Diabetes Association: www.diabetes.org ?Association of Diabetes Care & Education Specialists: www.diabeteseducator.org ?Contact a health care provider if: ?You have a medical condition that increases your risk of infection and you have any cuts, sores, or bruises on your feet. ?You have an injury that is not healing. ?You have redness on your legs or feet. ?You feel burning or tingling in your legs or feet. ?You have pain or cramps in your legs and feet. ?Your legs or feet are numb. ?Your feet always feel cold. ?You have pain around any toenails. ?Get help right away if: ?You have a wound,  scrape, corn, or callus on your foot and: ?You have pain, swelling, or redness that gets worse. ?You have fluid or blood coming from the wound, scrape, corn, or callus. ?Your wound, scrape, corn, or callus feels warm to the touch. ?You have pus or a bad smell coming from the wound, scrape, corn, or callus. ?You have a fever. ?You have a red line going up your leg. ?Summary ?Check your feet every day for blisters, cuts, bruises, sores, and redness. ?Apply a moisturizing lotion or petroleum jelly to the skin on your feet and to dry, brittle toenails. ?Wear shoes that fit properly and have enough cushioning. ?If you have foot problems, report any cuts, sores, or bruises to your health care provider immediately. ?Schedule a complete foot exam at least once a year (annually) or more often if you have foot problems. ?This information is not intended to replace advice given to you by your health care provider. Make sure you discuss any questions you have with your health care provider. ?Document Revised: 06/15/2020 Document Reviewed: 06/15/2020 ?Elsevier Patient Education ? [place]. ? ?

## 2022-02-25 NOTE — Assessment & Plan Note (Signed)
DM: Check A1c, continue [place]. ?Neuropathy: Few weeks ago went to a chiropractor, was told he has severe neuropathy, they offered $7000 treatment to "cure" neuropathy. ?He denies paresthesias, exam with minimal neuropathy.  He is already on gabapentin for a different issue. ?Recommend appropriate feet care, see AVS, otherwise observation.  For completeness we will check a V56, folic acid and RPR ?Constipation: Patient thinks this could be a side effect of Farxiga, symptoms are mild, recommend to continue Dulcolax as needed. ?CAD, A-fib: Although his BP sometimes get low he remains asx;  goes to the gym and exercise vigorously (for age) without problems. ?Continue Eliquis, Lotensin, Lasix.  Last BMP okay. ?Lump, neck: ?[place] 01/07/2022: ?Ill-defined area of mixed echogenicity in the region of palpable  `concern. No discrete mass visualized. If further assessment is necessary, CT neck with contrast would be the next step. ?Lump persists , still benign features, refer to ENT for further advice ?L groin lymphadenopathy?  Since the last visit we have been unable to schedule a ultrasound of the area, on today's exam, there is no mass or lymphadenopathy.  Recommend no further eval, just observation.  Patient is in agreement. ?RTC 4 months ?  ?

## 2022-02-26 LAB — RPR: RPR Ser Ql: NONREACTIVE

## 2022-03-13 ENCOUNTER — Telehealth: Payer: Self-pay | Admitting: Internal Medicine

## 2022-03-13 NOTE — Telephone Encounter (Signed)
Patient would like his 03/20 lab results mailed to him. Please advice.  ?

## 2022-03-13 NOTE — Telephone Encounter (Signed)
Results mailed 

## 2022-03-18 ENCOUNTER — Ambulatory Visit (INDEPENDENT_AMBULATORY_CARE_PROVIDER_SITE_OTHER): Payer: Medicare Other | Admitting: Sports Medicine

## 2022-03-18 DIAGNOSIS — M1711 Unilateral primary osteoarthritis, right knee: Secondary | ICD-10-CM | POA: Diagnosis not present

## 2022-03-18 MED ORDER — ACETAMINOPHEN ER 650 MG PO TBCR: 650.0000 mg | EXTENDED_RELEASE_TABLET | Freq: Two times a day (BID) | ORAL | 3 refills | Status: AC

## 2022-03-18 NOTE — Assessment & Plan Note (Signed)
This is a very pleasant 88 year old male, he has known knee osteoarthritis, he did well after Synvisc but still has significant pain with some bad days. ?Adding arthritis from Tylenol twice daily, we will touch base on the phone in 2 weeks and if insufficient improvement we will set him up with an orthopedic surgeon for discussion of arthroplasty. ?

## 2022-03-18 NOTE — Progress Notes (Signed)
? ? ?  Procedures performed today:   ? ?None. ? ?Independent interpretation of notes and tests performed by another provider:  ? ?None. ? ?Brief History, Exam, Impression, and Recommendations:   ? ?Primary osteoarthritis of right knee ?This is a very pleasant 88 year old male, he has known knee osteoarthritis, he did well after Synvisc but still has significant pain with some bad days. ?Adding arthritis from Tylenol twice daily, we will touch base on the phone in 2 weeks and if insufficient improvement we will set him up with an orthopedic surgeon for discussion of arthroplasty. ? ? ? ?___________________________________________ ?Gwen Her. Dianah Field, M.D., ABFM., CAQSM. ?Primary Care and Sports Medicine ?[place] ? ?Adjunct Instructor of Family Medicine  ?[employer] ?

## 2022-04-01 ENCOUNTER — Telehealth (INDEPENDENT_AMBULATORY_CARE_PROVIDER_SITE_OTHER): Payer: Medicare Other | Admitting: Sports Medicine

## 2022-04-01 DIAGNOSIS — M1711 Unilateral primary osteoarthritis, right knee: Secondary | ICD-10-CM | POA: Diagnosis not present

## 2022-04-01 NOTE — Assessment & Plan Note (Signed)
[name] returns in a telephone visit, he is doing really well, we did a series of Synvisc, he had other injections, he had some good days and bad days, we added arthritis from Tylenol twice daily, and he is doing extremely well. ?He is going to the gym, he is able to play golf, no need for surgical referral, return to see me as needed. ?

## 2022-04-01 NOTE — Progress Notes (Signed)
? ?  Virtual Visit via Telephone ?  ?I connected with  Florence Canner  on 04/01/22 by telephone/telehealth and verified that I am speaking with the correct person using two identifiers. ?  ?I discussed the limitations, risks, security and privacy concerns of performing an evaluation and management service by telephone, including the higher likelihood of inaccurate diagnosis and treatment, and the availability of in person appointments.  We also discussed the likely need of an additional face to face encounter for complete and high quality delivery of care.  I also discussed with the patient that there may be a patient responsible charge related to this service. The patient expressed understanding and wishes to proceed. ? ?Provider location is in medical facility. ?Patient location is at their home, different from provider location. ?People involved in care of the patient during this telehealth encounter were myself, my nurse/medical assistant, and my front office/scheduling team member. ? ?Review of Systems: No fevers, chills, night sweats, weight loss, chest pain, or shortness of breath.  ? ?Objective Findings:   ? ?General: Speaking full sentences, no audible heavy breathing.  Sounds alert and appropriately interactive.   ? ?Independent interpretation of tests performed by another provider:  ? ?None. ? ?Brief History, Exam, Impression, and Recommendations:   ? ?Primary osteoarthritis of right knee ?Mirko returns in a telephone visit, he is doing really well, we did a series of Synvisc, he had other injections, he had some good days and bad days, we added arthritis from Tylenol twice daily, and he is doing extremely well. ?He is going to the gym, he is able to play golf, no need for surgical referral, return to see me as needed. ? ? ?I discussed the above assessment and treatment plan with the patient. The patient was provided an opportunity to ask questions and all were answered. The patient agreed with the plan and  demonstrated an understanding of the instructions. ?  ?The patient was advised to call back or seek an in-person evaluation if the symptoms worsen or if the condition fails to improve as anticipated. ?  ?I provided 30 minutes of verbal and non-verbal time during this encounter date, time was needed to gather information, review chart, records, communicate/coordinate with staff remotely, as well as complete documentation. ? ? ?___________________________________________ ?Gwen Her. Dianah Field, M.D., ABFM., CAQSM. ?Primary Care and Sports Medicine ?[place] ? ?Adjunct Professor of Family Medicine  ?[employer] ?

## 2022-04-03 DIAGNOSIS — H02103 Unspecified ectropion of right eye, unspecified eyelid: Secondary | ICD-10-CM | POA: Diagnosis not present

## 2022-04-03 DIAGNOSIS — C7989 Secondary malignant neoplasm of other specified sites: Secondary | ICD-10-CM | POA: Diagnosis not present

## 2022-04-03 DIAGNOSIS — H6121 Impacted cerumen, right ear: Secondary | ICD-10-CM | POA: Diagnosis not present

## 2022-04-05 DIAGNOSIS — M9903 Segmental and somatic dysfunction of lumbar region: Secondary | ICD-10-CM | POA: Diagnosis not present

## 2022-04-05 DIAGNOSIS — M546 Pain in thoracic spine: Secondary | ICD-10-CM | POA: Diagnosis not present

## 2022-04-05 DIAGNOSIS — M5451 Vertebrogenic low back pain: Secondary | ICD-10-CM | POA: Diagnosis not present

## 2022-04-05 DIAGNOSIS — M9904 Segmental and somatic dysfunction of sacral region: Secondary | ICD-10-CM | POA: Diagnosis not present

## 2022-04-05 DIAGNOSIS — M9905 Segmental and somatic dysfunction of pelvic region: Secondary | ICD-10-CM | POA: Diagnosis not present

## 2022-04-05 DIAGNOSIS — M461 Sacroiliitis, not elsewhere classified: Secondary | ICD-10-CM | POA: Diagnosis not present

## 2022-04-05 DIAGNOSIS — M25552 Pain in left hip: Secondary | ICD-10-CM | POA: Diagnosis not present

## 2022-04-08 DIAGNOSIS — C44320 Squamous cell carcinoma of skin of unspecified parts of face: Secondary | ICD-10-CM | POA: Diagnosis not present

## 2022-04-20 ENCOUNTER — Other Ambulatory Visit: Payer: Self-pay | Admitting: Cardiology

## 2022-04-22 NOTE — Telephone Encounter (Signed)
Prescription refill request for Eliquis received. ?Indication:Afib ?Last office visit:8/22 ?Scr:1.4 ?Age: 88 ?Weight:94.5 kg ? ?Prescription refilled ? ?

## 2022-04-24 DIAGNOSIS — Z859 Personal history of malignant neoplasm, unspecified: Secondary | ICD-10-CM | POA: Diagnosis not present

## 2022-04-24 DIAGNOSIS — Z129 Encounter for screening for malignant neoplasm, site unspecified: Secondary | ICD-10-CM | POA: Diagnosis not present

## 2022-04-24 DIAGNOSIS — L821 Other seborrheic keratosis: Secondary | ICD-10-CM | POA: Diagnosis not present

## 2022-04-24 DIAGNOSIS — Z86008 Personal history of in-situ neoplasm of other site: Secondary | ICD-10-CM | POA: Diagnosis not present

## 2022-04-24 DIAGNOSIS — L570 Actinic keratosis: Secondary | ICD-10-CM | POA: Diagnosis not present

## 2022-04-24 DIAGNOSIS — L820 Inflamed seborrheic keratosis: Secondary | ICD-10-CM | POA: Diagnosis not present

## 2022-05-09 ENCOUNTER — Other Ambulatory Visit: Payer: Self-pay | Admitting: Internal Medicine

## 2022-05-10 DIAGNOSIS — M9903 Segmental and somatic dysfunction of lumbar region: Secondary | ICD-10-CM | POA: Diagnosis not present

## 2022-05-10 DIAGNOSIS — M9904 Segmental and somatic dysfunction of sacral region: Secondary | ICD-10-CM | POA: Diagnosis not present

## 2022-05-10 DIAGNOSIS — M9905 Segmental and somatic dysfunction of pelvic region: Secondary | ICD-10-CM | POA: Diagnosis not present

## 2022-05-10 DIAGNOSIS — M25552 Pain in left hip: Secondary | ICD-10-CM | POA: Diagnosis not present

## 2022-05-10 DIAGNOSIS — M5451 Vertebrogenic low back pain: Secondary | ICD-10-CM | POA: Diagnosis not present

## 2022-05-13 DIAGNOSIS — M9905 Segmental and somatic dysfunction of pelvic region: Secondary | ICD-10-CM | POA: Diagnosis not present

## 2022-05-13 DIAGNOSIS — M25552 Pain in left hip: Secondary | ICD-10-CM | POA: Diagnosis not present

## 2022-05-13 DIAGNOSIS — M9903 Segmental and somatic dysfunction of lumbar region: Secondary | ICD-10-CM | POA: Diagnosis not present

## 2022-05-13 DIAGNOSIS — M9904 Segmental and somatic dysfunction of sacral region: Secondary | ICD-10-CM | POA: Diagnosis not present

## 2022-05-13 DIAGNOSIS — M5451 Vertebrogenic low back pain: Secondary | ICD-10-CM | POA: Diagnosis not present

## 2022-05-15 DIAGNOSIS — M5451 Vertebrogenic low back pain: Secondary | ICD-10-CM | POA: Diagnosis not present

## 2022-05-15 DIAGNOSIS — M9903 Segmental and somatic dysfunction of lumbar region: Secondary | ICD-10-CM | POA: Diagnosis not present

## 2022-05-15 DIAGNOSIS — M9905 Segmental and somatic dysfunction of pelvic region: Secondary | ICD-10-CM | POA: Diagnosis not present

## 2022-05-15 DIAGNOSIS — M9904 Segmental and somatic dysfunction of sacral region: Secondary | ICD-10-CM | POA: Diagnosis not present

## 2022-05-15 DIAGNOSIS — M25552 Pain in left hip: Secondary | ICD-10-CM | POA: Diagnosis not present

## 2022-05-20 DIAGNOSIS — M5451 Vertebrogenic low back pain: Secondary | ICD-10-CM | POA: Diagnosis not present

## 2022-05-20 DIAGNOSIS — M461 Sacroiliitis, not elsewhere classified: Secondary | ICD-10-CM | POA: Diagnosis not present

## 2022-05-20 DIAGNOSIS — M9905 Segmental and somatic dysfunction of pelvic region: Secondary | ICD-10-CM | POA: Diagnosis not present

## 2022-05-20 DIAGNOSIS — M25552 Pain in left hip: Secondary | ICD-10-CM | POA: Diagnosis not present

## 2022-05-20 DIAGNOSIS — M9903 Segmental and somatic dysfunction of lumbar region: Secondary | ICD-10-CM | POA: Diagnosis not present

## 2022-05-20 DIAGNOSIS — M546 Pain in thoracic spine: Secondary | ICD-10-CM | POA: Diagnosis not present

## 2022-05-20 DIAGNOSIS — M9904 Segmental and somatic dysfunction of sacral region: Secondary | ICD-10-CM | POA: Diagnosis not present

## 2022-06-12 ENCOUNTER — Other Ambulatory Visit: Payer: Self-pay | Admitting: Internal Medicine

## 2022-07-01 ENCOUNTER — Ambulatory Visit (INDEPENDENT_AMBULATORY_CARE_PROVIDER_SITE_OTHER): Payer: Medicare Other | Admitting: Internal Medicine

## 2022-07-01 ENCOUNTER — Encounter: Payer: Self-pay | Admitting: Internal Medicine

## 2022-07-01 VITALS — BP 116/68 | HR 68 | Temp 98.0°F | Resp 18 | Ht 71.0 in | Wt 198.5 lb

## 2022-07-01 DIAGNOSIS — E7800 Pure hypercholesterolemia, unspecified: Secondary | ICD-10-CM | POA: Diagnosis not present

## 2022-07-01 DIAGNOSIS — N189 Chronic kidney disease, unspecified: Secondary | ICD-10-CM | POA: Diagnosis not present

## 2022-07-01 DIAGNOSIS — I25810 Atherosclerosis of coronary artery bypass graft(s) without angina pectoris: Secondary | ICD-10-CM | POA: Diagnosis not present

## 2022-07-01 DIAGNOSIS — E1159 Type 2 diabetes mellitus with other circulatory complications: Secondary | ICD-10-CM | POA: Diagnosis not present

## 2022-07-01 DIAGNOSIS — N1832 Chronic kidney disease, stage 3b: Secondary | ICD-10-CM

## 2022-07-01 DIAGNOSIS — R634 Abnormal weight loss: Secondary | ICD-10-CM

## 2022-07-01 DIAGNOSIS — N183 Chronic kidney disease, stage 3 unspecified: Secondary | ICD-10-CM | POA: Insufficient documentation

## 2022-07-01 LAB — BASIC METABOLIC PANEL
BUN: 37 mg/dL — ABNORMAL HIGH (ref 6–23)
CO2: 25 mEq/L (ref 19–32)
Calcium: 9.2 mg/dL (ref 8.4–10.5)
Chloride: 105 mEq/L (ref 96–112)
Creatinine, Ser: 1.83 mg/dL — ABNORMAL HIGH (ref 0.40–1.50)
GFR: 32.51 mL/min — ABNORMAL LOW (ref >60.00)
Glucose, Bld: 105 mg/dL — ABNORMAL HIGH (ref 70–99)
Potassium: 4.4 mEq/L (ref 3.5–5.1)
Sodium: 140 mEq/L (ref 135–145)

## 2022-07-01 LAB — LIPID PANEL
Cholesterol: 109 mg/dL (ref 0–200)
HDL: 31.2 mg/dL — ABNORMAL LOW (ref >39.00)
LDL Cholesterol: 47 mg/dL (ref 0–99)
NonHDL: 77.37
Total CHOL/HDL Ratio: 3
Triglycerides: 150 mg/dL — ABNORMAL HIGH (ref 0.0–149.0)
VLDL: 30 mg/dL (ref 0.0–40.0)

## 2022-07-01 LAB — CBC WITH DIFFERENTIAL/PLATELET
Basophils Absolute: 0 10*3/uL (ref 0.0–0.1)
Basophils Relative: 0.6 % (ref 0.0–3.0)
Eosinophils Absolute: 0.1 10*3/uL (ref 0.0–0.7)
Eosinophils Relative: 2.6 % (ref 0.0–5.0)
HCT: 42.9 % (ref 39.0–52.0)
Hemoglobin: 14.6 g/dL (ref 13.0–17.0)
Lymphocytes Relative: 27.8 % (ref 12.0–46.0)
Lymphs Abs: 1.5 10*3/uL (ref 0.7–4.0)
MCHC: 34 g/dL (ref 30.0–36.0)
MCV: 96.7 fl (ref 78.0–100.0)
Monocytes Absolute: 0.5 10*3/uL (ref 0.1–1.0)
Monocytes Relative: 9.4 % (ref 3.0–12.0)
Neutro Abs: 3.2 10*3/uL (ref 1.4–7.7)
Neutrophils Relative %: 59.6 % (ref 43.0–77.0)
Platelets: 135 10*3/uL — ABNORMAL LOW (ref 150.0–400.0)
RBC: 4.43 Mil/uL (ref 4.22–5.81)
RDW: 13.5 % (ref 11.5–15.5)
WBC: 5.4 10*3/uL (ref 4.0–10.5)

## 2022-07-01 LAB — HEMOGLOBIN A1C: Hgb A1c MFr Bld: 6.5 % (ref 4.6–6.5)

## 2022-07-01 NOTE — Assessment & Plan Note (Signed)
DM: On Farxiga, checking A1c. Hyperlipidemia: On atorvastatin 40 mg, last LFTs normal, check FLP. CKD: Creatinine fluctuates between 1.70, 1.40. Check BMP, CBC, encourage good hydration and NSAIDs avoidance. CAD, A-fib: Tolerating anticoagulation well.  From time to time BP is in the low side such as 90/50, typically after he exercises at the [facility] .  Denies symptoms or presyncope.  Encourage good hydration prior to exercise per have moderate exertion. Weight loss: Trend started after he was prescribed Farxiga 07-2021.  He feels well.  Observation for now.   Lump, neck: Saw ENT 03/2022, felt to be doing okay. RTC 5 months CPE

## 2022-07-01 NOTE — Patient Instructions (Addendum)
Recommend to proceed with covid booster (bivalent) at your pharmacy. Flu shot this fall.   You are drink plenty of fluids before and after you exercise  Avoid any anti-inflammatory such as ibuprofen, Advil, naproxen  Check the  blood pressure regularly BP GOAL is between 110/65 and  135/85. If it is consistently higher or lower, let me know    GO TO THE LAB : Get the blood work     [place], [place] back for a physical exam in 5 months

## 2022-07-01 NOTE — Progress Notes (Signed)
Subjective:    Patient ID: [name], male    DOB: [birth year], 87 y.o.   MRN: [id number]  DOS:  07/01/2022 Type of visit - description: Follow-up  Since the last office visit is doing well. Saw ENT April 2023, note reviewed. Anticoagulated, denies any nausea, vomiting.  No blood in the stool or in the urine.  Weight loss noted, he thinks since he started [place]. He feels well, no fever chills, no night sweats, no cough.  BP is at times low but he is asymptomatic, denies near syncope  Wt Readings from Last 3 Encounters:  07/01/22 198 lb 8 oz (90 kg)  02/25/22 208 lb 4 oz (94.5 kg)  01/07/22 207 lb 2 oz (94 kg)   Review of Systems See above   Past Medical History:  Diagnosis Date   Anemia    Antral gastritis 2013   EGD    Atrial fibrillation (HCC)    CAD (coronary artery disease)    s/p CABG 1993 (L-LAD, S-RI, S-D1);   echo 1/10: EF 55%;    myoview 12/09: inf MI, no ischemia   Candida esophagitis ([place]) 2013   EGD    Cataracts, bilateral    Family history of malignant neoplasm of gastrointestinal tract    Folliculitis    Hiatal hernia    HTN (hypertension)    Hyperlipidemia    Irritable bladder    Myocardial infarction [facility])    Obesity    Osteoarthritis     Past Surgical History:  Procedure Laterality Date   BLEPHAROPLASTY Bilateral 11/16/2021   upper lid   CATARACT EXTRACTION Right 07/21/2018   CHOLECYSTECTOMY     laparoscopic '92   CORONARY ARTERY BYPASS GRAFT     graft '92: LIMA-LAD, SVG - D2,R1, D1   ENTEROSCOPY  08/22/2012   Procedure: ENTEROSCOPY;  Surgeon: Juanita Craver, MD;  Location: [facility];  Service: Endoscopy;  Laterality: N/A;   INTRAOCULAR LENS EXCHANGE Right 07/21/2018   KNEE SURGERY     MOLE REMOVAL  2001 and 2008   PAROTIDECTOMY Right 06/18/2021   PILONIDAL CYST EXCISION      Current Outpatient Medications  Medication Instructions   acetaminophen (TYLENOL) 650 mg, Oral, 2 times daily   atorvastatin (LIPITOR) 40 mg, Oral, Daily    benazepril (LOTENSIN) 20 MG tablet TAKE 1 TABLET(20 MG) BY MOUTH DAILY   Coenzyme Q10 (COQ10) 100 MG CAPS 1 capsule, Oral, Daily   ELIQUIS 2.5 MG TABS tablet TAKE 1 TABLET(2.5 MG) BY MOUTH TWICE DAILY   FARXIGA 5 MG TABS tablet TAKE 1 TABLET(5 MG) BY MOUTH DAILY   furosemide (LASIX) 40 MG tablet TAKE 1 TABLET(40 MG) BY MOUTH DAILY   gabapentin (NEURONTIN) 300 MG capsule One tab PO qHS for a week, then BID for a week, then TID. May double weekly to a max of 3,'600mg'$ /day   Hylan (SYNVISC) 16 MG/2ML SOSY Inject into the right knee weekly x3   Melatonin 10 mg, Oral, Daily at bedtime   Multiple Vitamin (MULTIVITAMIN WITH MINERALS) TABS tablet 1 tablet, Oral, Daily   pantoprazole (PROTONIX) 40 mg, Oral, Daily   tamsulosin (FLOMAX) 0.4 MG CAPS capsule TAKE 1 CAPSULE(0.4 MG) BY MOUTH DAILY       Objective:   Physical Exam BP 116/68   Pulse 68   Temp 98 F (36.7 C) (Oral)   Resp 18   Ht '5\' 11"'$  (1.803 m)   Wt 198 lb 8 oz (90 kg)   SpO2 96%   BMI 27.69  kg/m  General:   Well developed, NAD, BMI noted. HEENT:  Normocephalic . Face symmetric, atraumatic Lungs:  CTA B Normal respiratory effort, no intercostal retractions, no accessory muscle use. Heart: Slightly irregular Lower extremities: no pretibial edema bilaterally  Skin: Not pale. Not jaundice Neurologic:  alert & oriented X3.  Speech normal, gait unassisted, limited by knee DJD and leg bowing Psych--  Cognition and judgment appear intact.  Cooperative with normal attention span and concentration.  Behavior appropriate. No anxious or depressed appearing.      Assessment    Assessment  DM: diet control, very mild neuropathy.  06/10/2022 HTN Hyperlipidemia CKD CV: --CAD, CABG 1993, Myoview 2009 no ischemia. --Atrial fibrillation -- rate control, xarelto -- chronic combined systolic/diastolic congestive heart failure Venous insufficiency, mild LE edema L>>R  LUTS    MSK: DJD, spinal stenosis, R wrist Fx in the  80s GI: --Recurrent melena: Work-up 2013 Dr. Sharlett Iles: Colonoscopy, EGD, capsule endoscopy.  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 Hyperlipidemia: On atorvastatin 40 mg, last LFTs normal, check FLP. CKD: Creatinine fluctuates between 1.70, 1.40. Check BMP, CBC, encourage good hydration and NSAIDs avoidance. CAD, A-fib: Tolerating anticoagulation well.  From time to time BP is in the low side such as 90/50, typically [facility].  Denies symptoms or presyncope.  Encourage good hydration prior to exercise per have moderate exertion. Weight loss: Trend started after he was prescribed Farxiga 07-2021.  He feels well.  Observation for now.   Lump, neck: Saw ENT 03/2022, felt to be doing okay. RTC 5 months CPE

## 2022-07-12 IMAGING — US US SOFT TISSUE HEAD/NECK
1 series · 14 of 15 positions shown · non-contrast
Comparison: Soft tissue neck ultrasound 03/27/2021

CLINICAL DATA: lump anterior neck.

EXAM:
ULTRASOUND OF HEAD/NECK SOFT TISSUES
TECHNIQUE: Ultrasound examination of the head and neck soft tissues was
performed in the area of clinical concern.

[Series 1: us soft tissue head/neck · 15 acquisitions, 14 frames shown]
[im 1/15]
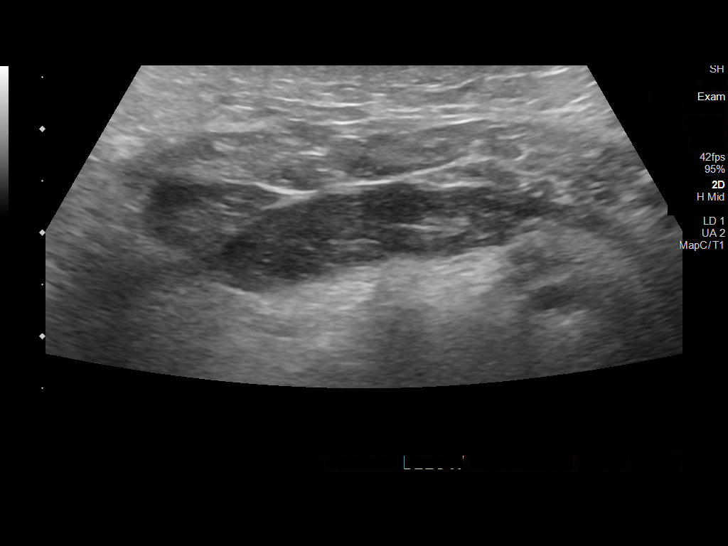
[im 2/15]
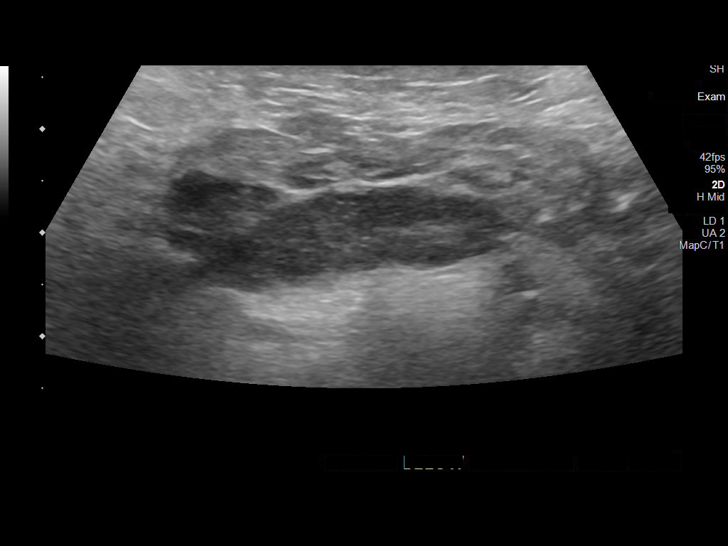
[im 3/15]
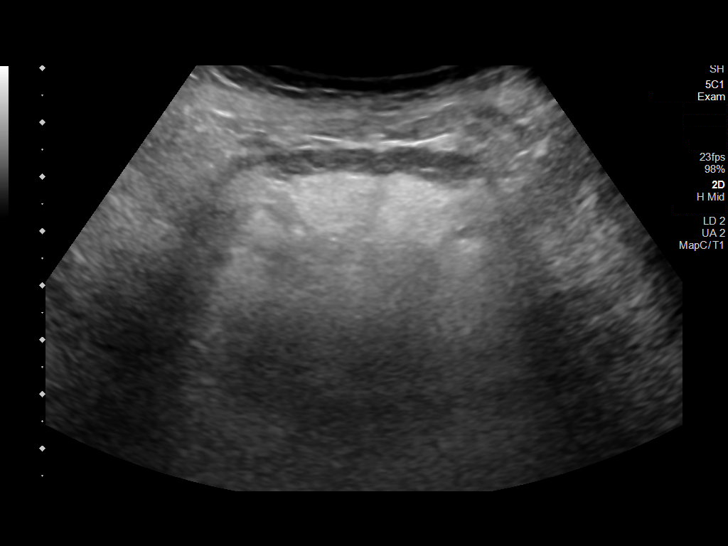
[im 4/15]
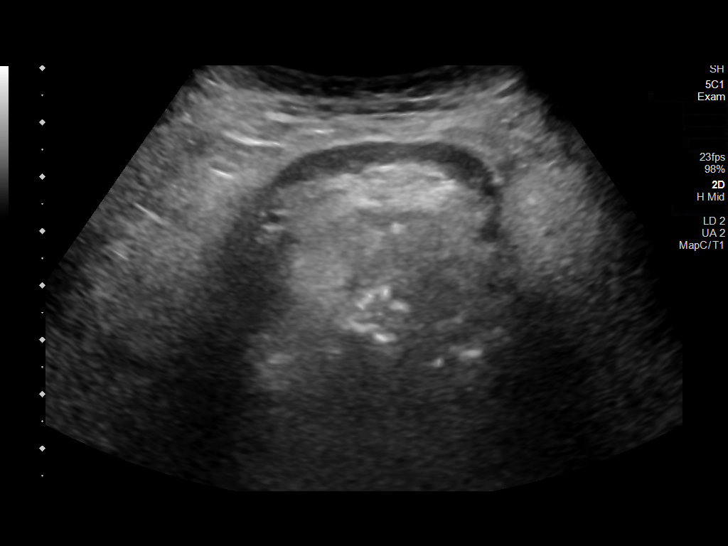
[im 5/15]
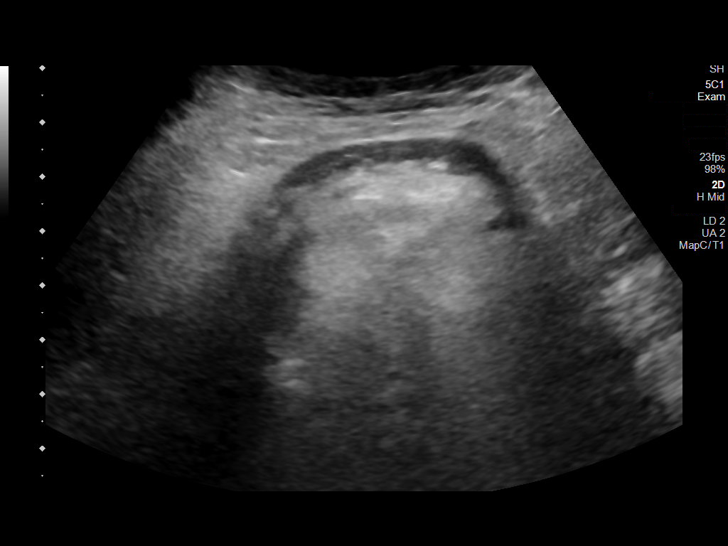
[im 6/15]
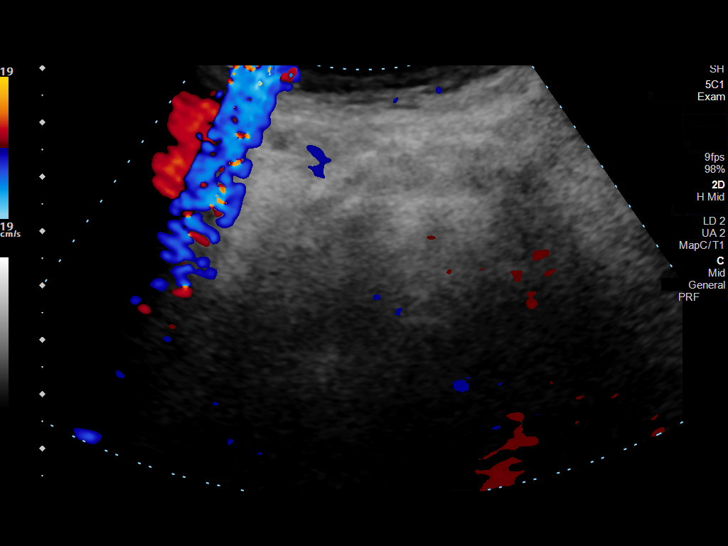
[im 7/15]
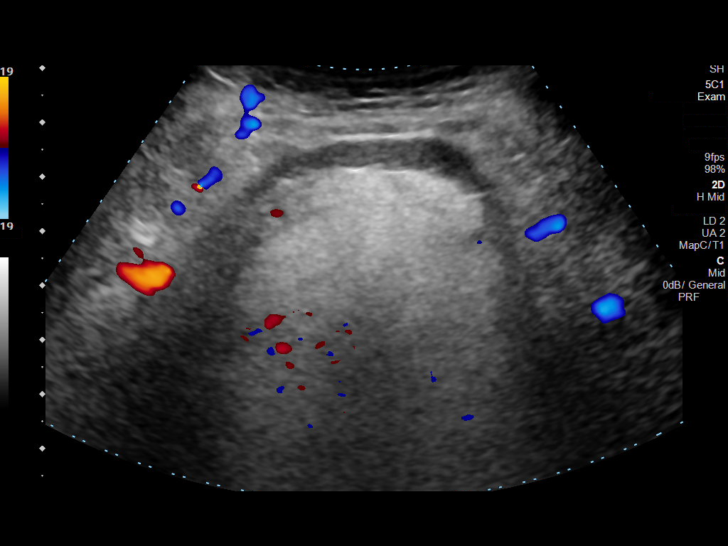
[im 9/15]
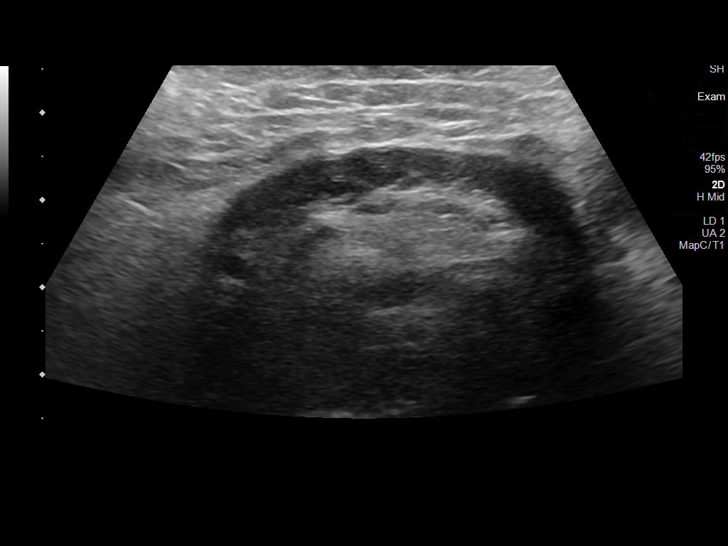
[im 10/15]
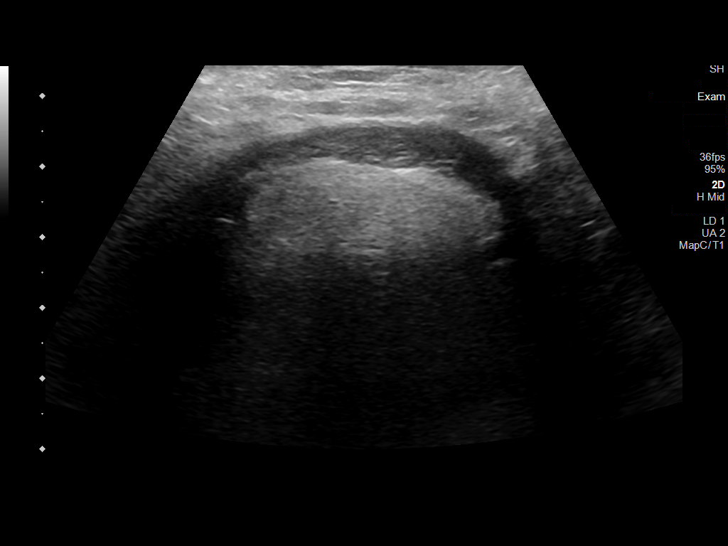
[im 11/15]
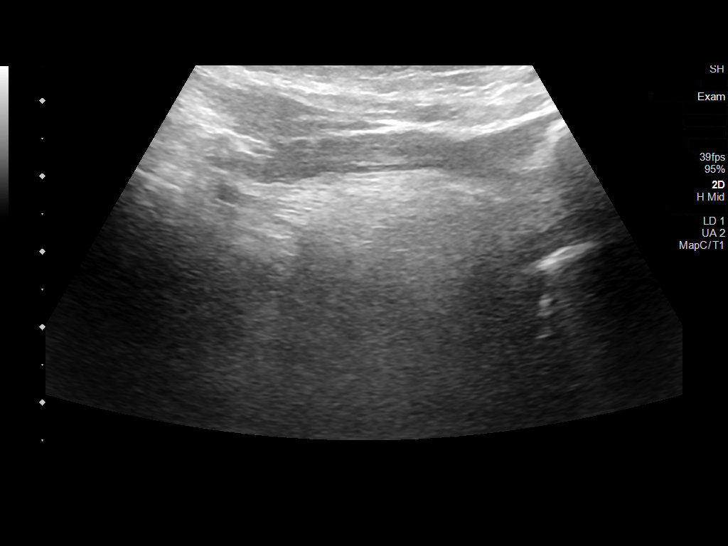
[im 12/15]
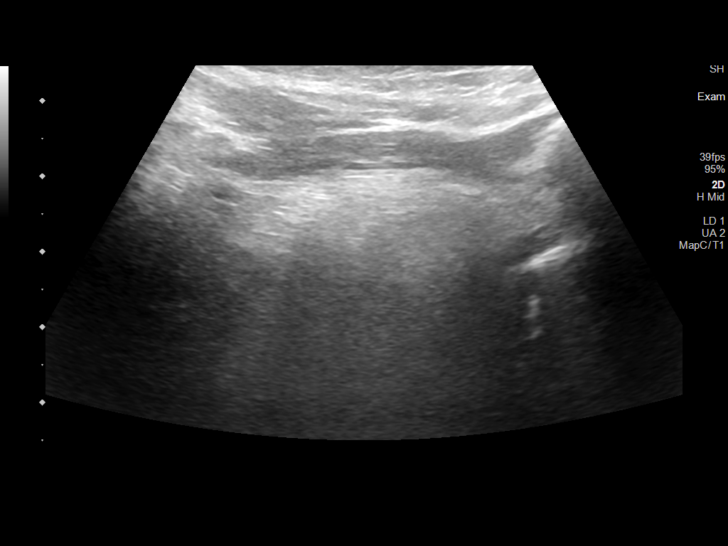
[im 13/15]
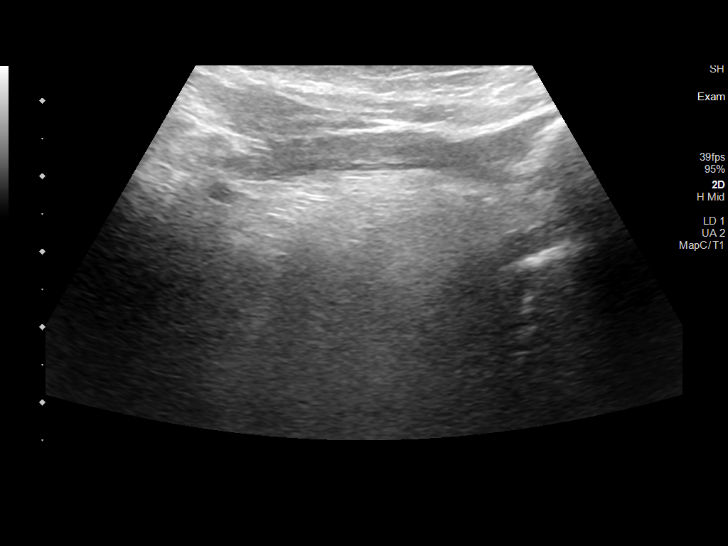
[im 14/15]
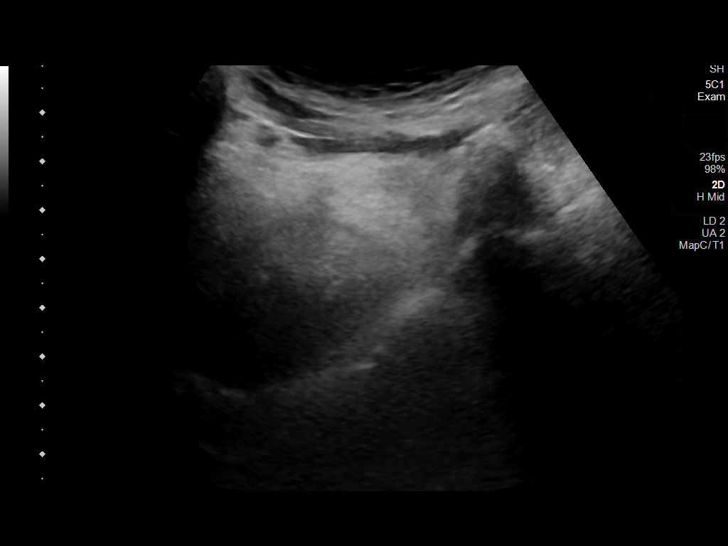
[im 15/15]
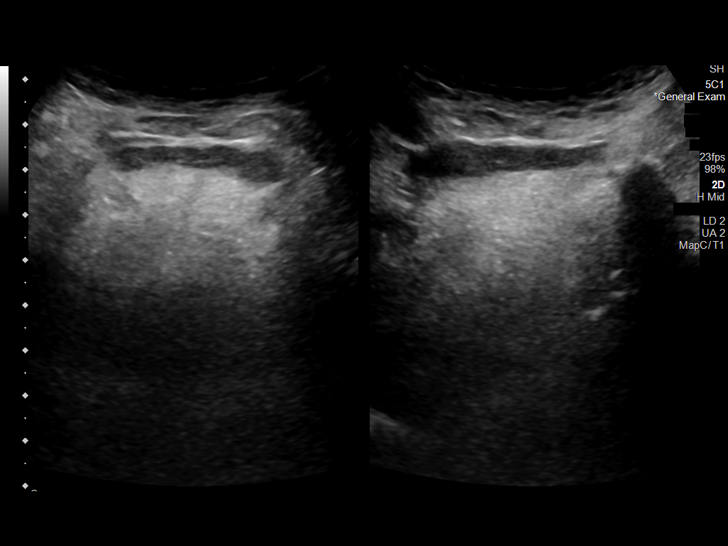

[14 of 15 positions shown; findings below may reference images not displayed]

FINDINGS: There is no discrete mass visualized. In the region of palpable
concern, there is an ill-defined area of mixed echogenicity without
distinct margins. Some component of this appears to be fat.
IMPRESSION: Ill-defined area of mixed echogenicity in the region of palpable
concern. No discrete mass visualized. If further assessment is
necessary, CT neck with contrast would be the next step.

## 2022-07-21 ENCOUNTER — Other Ambulatory Visit: Payer: Self-pay | Admitting: Internal Medicine

## 2022-07-22 ENCOUNTER — Other Ambulatory Visit: Payer: Self-pay | Admitting: Internal Medicine

## 2022-07-30 ENCOUNTER — Encounter: Payer: Self-pay | Admitting: Internal Medicine

## 2022-07-30 DIAGNOSIS — C7989 Secondary malignant neoplasm of other specified sites: Secondary | ICD-10-CM | POA: Diagnosis not present

## 2022-07-30 DIAGNOSIS — H02103 Unspecified ectropion of right eye, unspecified eyelid: Secondary | ICD-10-CM | POA: Diagnosis not present

## 2022-08-05 DIAGNOSIS — H35033 Hypertensive retinopathy, bilateral: Secondary | ICD-10-CM | POA: Diagnosis not present

## 2022-08-05 DIAGNOSIS — H02831 Dermatochalasis of right upper eyelid: Secondary | ICD-10-CM | POA: Diagnosis not present

## 2022-08-05 DIAGNOSIS — E119 Type 2 diabetes mellitus without complications: Secondary | ICD-10-CM | POA: Diagnosis not present

## 2022-08-05 DIAGNOSIS — H26493 Other secondary cataract, bilateral: Secondary | ICD-10-CM | POA: Diagnosis not present

## 2022-08-05 DIAGNOSIS — H0100B Unspecified blepharitis left eye, upper and lower eyelids: Secondary | ICD-10-CM | POA: Diagnosis not present

## 2022-08-05 DIAGNOSIS — H02105 Unspecified ectropion of left lower eyelid: Secondary | ICD-10-CM | POA: Diagnosis not present

## 2022-08-05 DIAGNOSIS — H0100A Unspecified blepharitis right eye, upper and lower eyelids: Secondary | ICD-10-CM | POA: Diagnosis not present

## 2022-08-05 DIAGNOSIS — H02834 Dermatochalasis of left upper eyelid: Secondary | ICD-10-CM | POA: Diagnosis not present

## 2022-08-07 ENCOUNTER — Ambulatory Visit (INDEPENDENT_AMBULATORY_CARE_PROVIDER_SITE_OTHER): Payer: Medicare Other | Admitting: *Deleted

## 2022-08-07 VITALS — BP 112/57 | HR 68 | Ht 71.0 in | Wt 204.6 lb

## 2022-08-07 DIAGNOSIS — Z0000 Encounter for general adult medical examination without abnormal findings: Secondary | ICD-10-CM | POA: Diagnosis not present

## 2022-08-07 NOTE — Progress Notes (Signed)
Subjective:   [name] is a 88 y.o. male who presents for Medicare Annual/Subsequent preventive examination.  Review of Systems    Defer to PCP. Cardiac Risk Factors include: advanced age (>27mn, >>6women);diabetes mellitus;male gender;hypertension;dyslipidemia     Objective:    Today's Vitals   08/07/22 1303  BP: (!) 112/57  Pulse: 68  Weight: 204 lb 9.6 oz (92.8 kg)  Height: '5\' 11"'$  (1.803 m)   Body mass index is 28.54 kg/m.     08/07/2022    1:10 PM 08/01/2021    3:46 PM 04/22/2020   10:04 AM 04/10/2020    9:34 AM 02/14/2015    2:42 PM 08/22/2014   10:20 AM 08/20/2012    8:16 PM  Advanced Directives  Does Patient Have a Medical Advance Directive? Yes Yes No No No No Patient does not have advance directive;Patient would not like information  Type of AScientist, forensicPower of ACohuttaLiving will HReddickLiving will       Does patient want to make changes to medical advance directive? No - Patient declined        Copy of H[place]ass[place]orin Chart? No - copy requested No - copy requested       Would patient like information on creating a medical advance directive?    No - Patient declined  No - patient declined information   Pre-existing out of facility DNR order (yellow form or pink MOST form)       No    Current Medications (verified) Outpatient Encounter Medications as of 08/07/2022  Medication Sig   acetaminophen (TYLENOL) 650 MG CR tablet Take 1 tablet (650 mg total) by mouth in the morning and at bedtime.   atorvastatin (LIPITOR) 40 MG tablet Take 1 tablet (40 mg total) by mouth daily.   benazepril (LOTENSIN) 20 MG tablet TAKE 1 TABLET(20 MG) BY MOUTH DAILY   Coenzyme Q10 (COQ10) 100 MG CAPS Take 1 capsule by mouth daily.    ELIQUIS 2.5 MG TABS tablet TAKE 1 TABLET(2.5 MG) BY MOUTH TWICE DAILY   FARXIGA 5 MG TABS tablet TAKE 1 TABLET(5 MG) BY MOUTH DAILY   furosemide (LASIX) 40 MG tablet TAKE 1 TABLET(40 MG) BY MOUTH  DAILY   gabapentin (NEURONTIN) 300 MG capsule One tab PO qHS for a week, then BID for a week, then TID. May double weekly to a max of 3,'600mg'$ /day   Melatonin 10 MG TABS Take 10 mg by mouth at bedtime.    Multiple Vitamin (MULTIVITAMIN WITH MINERALS) TABS tablet Take 1 tablet by mouth daily.   tamsulosin (FLOMAX) 0.4 MG CAPS capsule TAKE 1 CAPSULE(0.4 MG) BY MOUTH DAILY   [DISCONTINUED] Hylan (SYNVISC) 16 MG/2ML SOSY Inject into the right knee weekly x3   [DISCONTINUED] pantoprazole (PROTONIX) 40 MG tablet Take 1 tablet (40 mg total) by mouth daily.   No facility-administered encounter medications on file as of 08/07/2022.    Allergies (verified) Patient has no known allergies.   History: Past Medical History:  Diagnosis Date   Anemia    Antral gastritis 2013   EGD    [place]rial [place]    CAD (coronary artery disease)    s/p CABG 1993 (L-LAD, S-RI, S-D1);   echo 1/10: EF 55%;    myoview 12/09: inf MI, no ischemia   [place]   EGD    Cataracts, bilateral    Family history of malignant neoplasm of gastrointestinal tract  Folliculitis    Hiatal hernia    HTN (hypertension)    Hyperlipidemia    Irritable bladder    Myocardial infarction [facility])    Obesity    Osteoarthritis    Past Surgical History:  Procedure Laterality Date   BLEPHAROPLASTY Bilateral 11/16/2021   upper lid   CATARACT EXTRACTION Right 07/21/2018   CHOLECYSTECTOMY     laparoscopic '92   CORONARY ARTERY BYPASS GRAFT     graft '92: LIMA-LAD, SVG - D2,R1, D1   ENTEROSCOPY  08/22/2012   Procedure: ENTEROSCOPY;  Surgeon: Juanita Craver, MD;  Location: [facility] END[facility]COPY;  Service: Endoscopy;  Laterality: N/A;   INTRAOCULAR LENS EXCHANGE Right 07/21/2018   KNEE SURGERY     MOLE REMOVAL  2001 and 2008   PAROTIDECTOMY Right 06/18/2021   PILONIDAL CYST EXCISION     Family History  Problem Relation Age of Onset   Coronary artery disease Father        and brother   Heart attack Father         and brother-fatal   Stroke Father    Breast cancer Mother    Alzheimer's disease Mother    Colon cancer Maternal Uncle    Esophageal cancer Neg Hx    Stomach cancer Neg Hx    Rectal cancer Neg Hx    Prostate cancer Neg Hx    Social History   Socioeconomic History   Marital status: Widowed    Spouse name: Not on file   Number of children: 1   Years of education: Not on file   Highest education level: Not on file  Occupational History   Occupation: Retired    Fish farm manager: RETIRED  Tobacco Use   Smoking status: Former    Years: 16.00    Types: Cigarettes    Quit date: 08/14/1964    Years since quitting: 58.0   Smokeless tobacco: Never   Tobacco comments:    quit 1965  Vaping Use   Vaping Use: Never used  Substance and Sexual Activity   Alcohol use: Not Currently    Alcohol/week: 7.0 standard drinks of alcohol    Types: 7 Shots of liquor per week   Drug use: No   Sexual activity: Not on file  Other Topics Concern   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 Married '57- widowed Feb 11, 2023. 1 son -'32- died '15 MVA.    Lives w/ girlfriend     Emergency contact: see DPR, brother and  Daron Offer [phone] (friend)   Social Determinants of Health   Financial Resource Strain: Low Risk  (10/24/2020)   Overall Financial Resource Strain (CARDIA)    Difficulty of Paying Living Expenses: Not hard at all  Food Insecurity: No Food Insecurity (08/01/2021)   Hunger Vital Sign    Worried About Running Out of Food in the Last Year: Never true    [place] in the Last Year: Never true  Transportation Needs: No Transportation Needs (08/01/2021)   PRAPARE - Hydrologist (Medical): No    Lack of Transportation (Non-Medical): No  Physical Activity: Sufficiently Active (08/01/2021)   Exercise Vital Sign    Days of Exercise per Week: 6 days    Minutes of Exercise per Session: 30 min  Stress: No Stress Concern Present  (08/01/2021)   [place]    Feeling of  Stress : Not at all  Social Connections: Moderately Isolated (08/07/2022)   Social Connection and Isolation Panel [NHANES]    Frequency of Communication with Friends and Family: Twice a week    Frequency of Social Gatherings with Friends and Family: Twice a week    Attends Religious Services: Never    Marine scientist or Organizations: Yes    Attends Archivist Meetings: Never    Marital Status: Widowed    Tobacco Counseling Counseling given: Not Answered Tobacco comments: quit 1965   Clinical Intake:  Pre-visit preparation completed: Yes  Pain : No/denies pain     Diabetes: Yes CBG done?: No Did pt. bring in CBG monitor from home?: No  How often do you need to have someone help you when you read instructions, pamphlets, or other written materials from your doctor or pharmacy?: 1 - Never  Diabetic? Yes Nutrition Risk Assessment:  Has the patient had any N/V/D within the last 2 months?  No  Does the patient have any non-healing wounds?  No  Has the patient had any unintentional weight loss or weight gain?  No   Diabetes:  Is the patient diabetic?  Yes  If diabetic, was a CBG obtained today?  No  Did the patient bring in their glucometer from home?  No  How often do you monitor your CBG's? Does not check them.   Financial Strains and Diabetes Management:  Are you having any financial strains with the device, your supplies or your medication? No .  Does the patient want to be seen by Chronic Care Management for management of their diabetes?  No  Would the patient like to be referred to a Nutritionist or for Diabetic Management?  No   Diabetic Exams:  Diabetic Eye Exam: Completed 08/05/22 Diabetic Foot Exam: Completed 02/25/22    Interpreter Needed?: No  Information entered by :: Beatris Ship, [place]   Activities of Daily Living    08/07/2022     1:12 PM  In your present state of health, do you have any difficulty performing the following activities:  Hearing? 0  Vision? 0  Difficulty concentrating or making decisions? 0  Walking or climbing stairs? 1  Comment arthritis in joints  Dressing or bathing? 0  Doing errands, shopping? 0  Preparing Food and eating ? N  Using the Toilet? N  In the past six months, have you accidently leaked urine? Y  Do you have problems with loss of bowel control? Y  Managing your Medications? N  Managing your Finances? N  Housekeeping or managing your Housekeeping? N    Patient Care Team: Colon Branch, MD as PCP - General (Internal Medicine) Stanford Breed Denice Bors, MD as PCP - Cardiology (Cardiology) Juanita Craver, MD as Consulting Physician (Gastroenterology) Earlie Server, MD as Consulting Physician (Orthopedic Surgery) Wilford Corner, MD as Consulting Physician (Ophthalmology) Melina Schools, MD as Consulting Physician (Orthopedic Surgery) Leonia Corona, MD as Referring Physician (Ophthalmology) Dorothe Pea, MD as Referring Physician (Otolaryngology)  Indicate any recent Medical Services you may have received from other than [facility] providers in the past year (date may be approximate).     Assessment:   This is a routine wellness examination for [name].  Hearing/Vision screen No results found.  Dietary issues and exercise activities discussed: Current Exercise Habits: Home exercise routine, Type of exercise: strength training/weights;Other - see comments (plays golf twice a week), Time (Minutes): 50, Frequency (Times/Week): 2, Weekly Exercise (Minutes/Week): 100, Intensity: Moderate, Exercise limited  by: None identified   Goals Addressed             This Visit's Progress    Maintain healthy active lifestyle.   On track      Depression Screen    08/07/2022    1:11 PM 07/01/2022   10:43 AM 11/21/2021    1:57 PM 08/01/2021    3:48 PM 12/06/2020    1:04 PM 04/10/2020    9:38 AM  07/28/2019    8:51 AM  PHQ 2/9 Scores  PHQ - 2 Score 0 0 0 0 0 0 0    Fall Risk    08/07/2022    1:11 PM 07/01/2022   10:43 AM 11/21/2021    1:57 PM 08/01/2021    3:47 PM 12/06/2020    1:04 PM  [place] in the past year? 0 0 0 0 0  Number falls in past yr: 0 0 0 0 0  Injury with Fall? 0 0 0 0 0  Risk for fall due to : No Fall Risks      Follow up Falls evaluation completed Falls evaluation completed Falls evaluation completed Falls prevention discussed     [place]:  Any stairs in or around the home? Yes  If so, are there any without handrails? No  Home free of loose throw rugs in walkways, pet beds, electrical cords, etc? Yes  Adequate lighting in your home to reduce risk of falls? Yes   ASSISTIVE DEVICES UTILIZED TO PREVENT FALLS:  Life alert? No  Use of a cane, walker or w/c? No  Grab bars in the bathroom? Yes  Shower chair or bench in shower? Yes  Elevated toilet seat or a handicapped toilet? Yes   TIMED UP AND GO:  Was the test performed? Yes .  Length of time to ambulate 10 feet: 15 sec.   Gait steady and fast without use of assistive device  Cognitive Function:        Immunizations Immunization History  Administered Date(s) Administered   Influenza Split 08/27/2012, 09/06/2014, 09/09/2018   Influenza Whole 09/04/2009   Influenza, High Dose Seasonal PF 09/07/2019   Influenza-Unspecified 09/25/2015, 09/09/2016, 09/08/2017, 09/08/2020, 09/08/2021   PFIZER Comirnaty(Gray Top)Covid-19 Tri-Sucrose Vaccine 07/09/2021   PFIZER(Purple Top)SARS-COV-2 Vaccination 12/30/2019, 01/17/2020, 09/08/2020   Pneumococcal Conjugate-13 04/05/2014   Pneumococcal Polysaccharide-23 01/07/2012, 11/21/2021   Td 10/20/2009   Tdap 03/17/2012, 02/14/2015   Zoster Recombinat (Shingrix) 10/27/2018, 12/26/2018   Zoster, Live 01/17/2015    TDAP status: Up to date  Flu Vaccine status: Up to date  Pneumococcal vaccine status: Up to  date  Covid-19 vaccine status: Information provided on how to obtain vaccines.   Qualifies for Shingles Vaccine? Yes   Zostavax completed Yes   Shingrix Completed?: Yes  Screening Tests Health Maintenance  Topic Date Due   COVID-19 Vaccine (5 - Pfizer risk series) 09/03/2021   INFLUENZA VACCINE  07/09/2022   HEMOGLOBIN A1C  01/01/2023   FOOT EXAM  02/26/2023   OPHTHALMOLOGY EXAM  08/06/2023   TETANUS/TDAP  02/13/2025   Pneumonia Vaccine 11+ Years old  Completed   Zoster Vaccines- Shingrix  Completed   HPV VACCINES  Aged Out    Health Maintenance  Health Maintenance Due  Topic Date Due   COVID-19 Vaccine (5 - Pfizer risk series) 09/03/2021   INFLUENZA VACCINE  07/09/2022    Colorectal cancer screening: No longer required.   Lung Cancer Screening: (Low Dose CT Chest recommended  if Age 38-80 years, 62 pack-year currently smoking OR have quit w/in 15years.) does not qualify.   Lung Cancer Screening Referral: N/a  Additional Screening:  Hepatitis C Screening: does not qualify; Completed aged out  Vision Screening: Recommended annual ophthalmology exams for early detection of glaucoma and other disorders of the eye. Is the patient up to date with their annual eye exam?  Yes  Who is the provider or what is the name of the office in which the patient attends annual eye exams? Dr. Clide Dales If pt is not established with a provider, would they like to be referred to a provider to establish care? No .   Dental Screening: Recommended annual dental exams for proper oral hygiene  Community Resource Referral / Chronic Care Management: CRR required this visit?  No   CCM required this visit?  No      Plan:     I have personally reviewed and noted the following in the patient's chart:   Medical and social history Use of alcohol, tobacco or illicit drugs  Current medications and supplements including opioid prescriptions. Patient is not currently taking opioid  prescriptions. Functional ability and status Nutritional status Physical activity Advanced directives List of other physicians Hospitalizations, surgeries, and ER visits in previous 12 months Vitals Screenings to include cognitive, depression, and falls Referrals and appointments  In addition, I have reviewed and discussed with patient certain preventive protocols, quality metrics, and best practice recommendations. A written personalized care plan for preventive services as well as general preventive health recommendations were provided to patient.     Beatris Ship, [place]   08/07/2022   Nurse Notes: None

## 2022-08-07 NOTE — Patient Instructions (Signed)
[name] , Thank you for taking time to come for your Medicare Wellness Visit. I appreciate your ongoing commitment to your health goals. Please review the following plan we discussed and let me know if I can assist you in the future.   These are the goals we discussed:  Goals      Chronic Care Management Pharmacy Care Plan     CARE PLAN ENTRY (see longitudinal plan of care for additional care plan information)  Current Barriers:  Chronic Disease Management support, education, and care coordination needs related to Hypertension, Hyperlipidemia/Coronary Atherosclerosis, Diabetes, Afib, Heart Failure, GERD, BPH, Pain   Hypertension BP Readings from Last 3 Encounters:  10/04/20 (!) 150/74  07/31/20 (!) 143/73  06/28/20 137/75  Pharmacist Clinical Goal(s): Over the next 90 days, patient will work with PharmD and providers to achieve BP goal <140/90 Current regimen:  Benazepril '40mg'$  daily AM Metoprolol succinate '25mg'$  daily AM Amlodipine '5mg'$  daily Interventions: Requested patient to check BP 2-3 times per week and record Discussed possibility of getting amlodipine/benazepril combo pill Patient self care activities - Over the next 90 days, patient will: Check BP 2-3 times per week, document, and provide at future appointments Ensure daily salt intake < 2300 mg/day  Hyperlipidemia Lab Results  Component Value Date/Time   LDLCALC 57 02/24/2019 10:29 AM   LDLDIRECT 46.0 02/07/2020 08:15 AM  Pharmacist Clinical Goal(s): Over the next 90 days, patient will work with PharmD and providers to maintain LDL goal < 70  Current regimen:  Atorvastatin '80mg'$  1/2 tab ('40mg'$ ) daily  Fish Oil '1000mg'$  daily Coenzyme Q10 '100mg'$  daily Interventions: Requested that patient inform Dr. Stanford Breed that he is only taking 1/2 tab of atorvastatin to help align fill dates appropriately when prescribing Patient self care activities - Over the next 90 days, patient will: Get atorvastatin script updated to  reflect how he takes it  Maintain cholesterol medication regimen.   Diabetes Lab Results  Component Value Date/Time   HGBA1C 7.2 (H) 07/31/2020 11:54 AM   HGBA1C 6.8 (H) 02/07/2020 08:15 AM  Pharmacist Clinical Goal(s): Over the next 90 days, patient will work with PharmD and providers to maintain A1c goal <7% Current regimen:  Diet and exercise management   Interventions: Discussed diet and exercise Patient self care activities - Over the next 90 days, patient will: Maintain a1c <7%  Pain Pharmacist Clinical Goal(s) Over the next 90 days, patient will work with PharmD and providers to reduce symptoms of pain Current regimen:  Tramadol '50mg'$  three times daily as needed Tylenol PM 25-'500mg'$  #2 HS Interventions: Coordinated appt with Dr. Isabelle Course office for 08/21/2020 Patient self care activities - Over the next 90 days, patient will: Keep appt with Dr. Francesco Runner  Medication management Pharmacist Clinical Goal(s): Over the next 90 days, patient will work with PharmD and providers to achieve optimal medication adherence Current pharmacy: [facility] Interventions Comprehensive medication review performed. Continue current medication management strategy Patient self care activities - Over the next 90 days, patient will: Focus on medication adherence by filling and taking medications appropriately  Take medications as prescribed Report any questions or concerns to PharmD and/or provider(s)  Please see past updates related to this goal by clicking on the "Past Updates" button in the selected goal       Maintain healthy active lifestyle.        This is a list of the screening recommended for you and due dates:  Health Maintenance  Topic Date Due   COVID-19 Vaccine (5 -  Pfizer risk series) 09/03/2021   Flu Shot  07/09/2022   Hemoglobin A1C  01/01/2023   Complete foot exam   02/26/2023   Eye exam for diabetics  08/06/2023   Tetanus Vaccine  02/13/2025   Pneumonia Vaccine   Completed   Zoster (Shingles) Vaccine  Completed   HPV Vaccine  Aged Out     Next appointment: Follow up in one year for your annual wellness visit.   Preventive Care 58 Years and Older, Male Preventive care refers to lifestyle choices and visits with your health care provider that can promote health and wellness. What does preventive care include? A yearly physical exam. This is also called an annual well check. Dental exams once or twice a year. Routine eye exams. Ask your health care provider how often you should have your eyes checked. Personal lifestyle choices, including: Daily care of your teeth and gums. Regular physical activity. Eating a healthy diet. Avoiding tobacco and drug use. Limiting alcohol use. Practicing safe sex. Taking low doses of aspirin every day. Taking vitamin and mineral supplements as recommended by your health care provider. What happens during an annual well check? The services and screenings done by your health care provider during your annual well check will depend on your age, overall health, lifestyle risk factors, and family history of disease. Counseling  Your health care provider may ask you questions about your: Alcohol use. Tobacco use. Drug use. Emotional well-being. Home and relationship well-being. Sexual activity. Eating habits. History of falls. Memory and ability to understand (cognition). Work and work Statistician. Screening  You may have the following tests or measurements: Height, weight, and BMI. Blood pressure. Lipid and cholesterol levels. These may be checked every 5 years, or more frequently if you are over 60 years old. Skin check. Lung cancer screening. You may have this screening every year starting at age 40 if you have a 30-pack-year history of smoking and currently smoke or have quit within the past 15 years. Fecal occult blood test (FOBT) of the stool. You may have this test every year starting at age  81. Flexible sigmoidoscopy or colonoscopy. You may have a sigmoidoscopy every 5 years or a colonoscopy every 10 years starting at age 41. Prostate cancer screening. Recommendations will vary depending on your family history and other risks. Hepatitis C blood test. Hepatitis B blood test. Sexually transmitted disease (STD) testing. Diabetes screening. This is done by checking your blood sugar (glucose) after you have not eaten for a while (fasting). You may have this done every 1-3 years. Abdominal aortic aneurysm (AAA) screening. You may need this if you are a current or former smoker. Osteoporosis. You may be screened starting at age 39 if you are at high risk. Talk with your health care provider about your test results, treatment options, and if necessary, the need for more tests. Vaccines  Your health care provider may recommend certain vaccines, such as: Influenza vaccine. This is recommended every year. Tetanus, diphtheria, and acellular pertussis (Tdap, Td) vaccine. You may need a Td booster every 10 years. Zoster vaccine. You may need this after age 69. Pneumococcal 13-valent conjugate (PCV13) vaccine. One dose is recommended after age 36. Pneumococcal polysaccharide (PPSV23) vaccine. One dose is recommended after age 36. Talk to your health care provider about which screenings and vaccines you need and how often you need them. This information is not intended to replace advice given to you by your health care provider. Make sure you discuss any questions you  have with your health care provider. Document Released: 12/22/2015 Document Revised: 08/14/2016 Document Reviewed: 09/26/2015 Elsevier Interactive Patient Education  2017 [place] Prevention in the Home Falls can cause injuries. They can happen to people of all ages. There are many things you can do to make your home safe and to help prevent falls. What can I do on the outside of my home? Regularly fix the edges of  walkways and driveways and fix any cracks. Remove anything that might make you trip as you walk through a door, such as a raised step or threshold. Trim any bushes or trees on the path to your home. Use bright outdoor lighting. Clear any walking paths of anything that might make someone trip, such as rocks or tools. Regularly check to see if handrails are loose or broken. Make sure that both sides of any steps have handrails. Any raised decks and porches should have guardrails on the edges. Have any leaves, snow, or ice cleared regularly. Use sand or salt on walking paths during winter. Clean up any spills in your garage right away. This includes oil or grease spills. What can I do in the bathroom? Use night lights. Install grab bars by the toilet and in the tub and shower. Do not use towel bars as grab bars. Use non-skid mats or decals in the tub or shower. If you need to sit down in the shower, use a plastic, non-slip stool. Keep the floor dry. Clean up any water that spills on the floor as soon as it happens. Remove soap buildup in the tub or shower regularly. Attach bath mats securely with double-sided non-slip rug tape. Do not have throw rugs and other things on the floor that can make you trip. What can I do in the bedroom? Use night lights. Make sure that you have a light by your bed that is easy to reach. Do not use any sheets or blankets that are too big for your bed. They should not hang down onto the floor. Have a firm chair that has side arms. You can use this for support while you get dressed. Do not have throw rugs and other things on the floor that can make you trip. What can I do in the kitchen? Clean up any spills right away. Avoid walking on wet floors. Keep items that you use a lot in easy-to-reach places. If you need to reach something above you, use a strong step stool that has a grab bar. Keep electrical cords out of the way. Do not use floor polish or wax that  makes floors slippery. If you must use wax, use non-skid floor wax. Do not have throw rugs and other things on the floor that can make you trip. What can I do with my stairs? Do not leave any items on the stairs. Make sure that there are handrails on both sides of the stairs and use them. Fix handrails that are broken or loose. Make sure that handrails are as long as the stairways. Check any carpeting to make sure that it is firmly attached to the stairs. Fix any carpet that is loose or worn. Avoid having throw rugs at the top or bottom of the stairs. If you do have throw rugs, attach them to the floor with carpet tape. Make sure that you have a light switch at the top of the stairs and the bottom of the stairs. If you do not have them, ask someone to add them for you.  What else can I do to help prevent falls? Wear shoes that: Do not have high heels. Have rubber bottoms. Are comfortable and fit you well. Are closed at the toe. Do not wear sandals. If you use a stepladder: Make sure that it is fully opened. Do not climb a closed stepladder. Make sure that both sides of the stepladder are locked into place. Ask someone to hold it for you, if possible. Clearly mark and make sure that you can see: Any grab bars or handrails. First and last steps. Where the edge of each step is. Use tools that help you move around (mobility aids) if they are needed. These include: Canes. Walkers. Scooters. Crutches. Turn on the lights when you go into a dark area. Replace any light bulbs as soon as they burn out. Set up your furniture so you have a clear path. Avoid moving your furniture around. If any of your floors are uneven, fix them. If there are any pets around you, be aware of where they are. Review your medicines with your doctor. Some medicines can make you feel dizzy. This can increase your chance of falling. Ask your doctor what other things that you can do to help prevent falls. This  information is not intended to replace advice given to you by your health care provider. Make sure you discuss any questions you have with your health care provider. Document Released: 09/21/2009 Document Revised: 05/02/2016 Document Reviewed: 12/30/2014 Elsevier Interactive Patient Education  2017 [employer].

## 2022-08-19 ENCOUNTER — Other Ambulatory Visit: Payer: Self-pay | Admitting: Internal Medicine

## 2022-08-28 DIAGNOSIS — C44622 Squamous cell carcinoma of skin of right upper limb, including shoulder: Secondary | ICD-10-CM | POA: Diagnosis not present

## 2022-09-04 ENCOUNTER — Ambulatory Visit (INDEPENDENT_AMBULATORY_CARE_PROVIDER_SITE_OTHER): Payer: Medicare Other | Admitting: Internal Medicine

## 2022-09-04 ENCOUNTER — Telehealth: Payer: Self-pay | Admitting: Internal Medicine

## 2022-09-04 ENCOUNTER — Encounter: Payer: Self-pay | Admitting: Internal Medicine

## 2022-09-04 VITALS — BP 130/78 | HR 48 | Temp 97.8°F | Resp 18 | Ht 71.0 in | Wt 203.1 lb

## 2022-09-04 DIAGNOSIS — N481 Balanitis: Secondary | ICD-10-CM | POA: Diagnosis not present

## 2022-09-04 MED ORDER — CLOTRIMAZOLE-BETAMETHASONE 1-0.05 % EX CREA: 1.0000 | TOPICAL_CREAM | Freq: Two times a day (BID) | CUTANEOUS | 0 refills | Status: DC

## 2022-09-04 NOTE — Telephone Encounter (Signed)
Patient states that ever since he started taking the [place] he has developed swelling on the tip of his penis. Please advise.

## 2022-09-04 NOTE — Telephone Encounter (Signed)
Pt called back regarding the same issue. He stated he is also on an antibiotic that is causing the same sxs. He would like to know what to do as soon as possible.

## 2022-09-04 NOTE — Telephone Encounter (Signed)
Can you see if he can come in today at 3:20pm please?

## 2022-09-04 NOTE — Telephone Encounter (Signed)
Pt had another appt today at 3pm with another office. Appt scheduled today w/ PCP at 11:20am.

## 2022-09-04 NOTE — Telephone Encounter (Signed)
Needs appt

## 2022-09-04 NOTE — Progress Notes (Unsigned)
Subjective:    Patient ID: [name], male    DOB: 1931/11/16, 88 y.o.   MRN: [id number]  DOS:  09/04/2022 Type of visit - description: Acute  Symptoms started yesterday, noted some redness and irritation at the foreskin and glans. No other symptoms: Denies dysuria, no blood in the urine. No recent blisters in the area No difficulty urinating. He is taking antibiotic Rx by his dermatologist.  Has 1 tablet left  Wt Readings from Last 3 Encounters:  09/04/22 203 lb 2 oz (92.1 kg)  08/07/22 204 lb 9.6 oz (92.8 kg)  07/01/22 198 lb 8 oz (90 kg)     Review of Systems See above   Past Medical History:  Diagnosis Date   Anemia    Antral gastritis 2013   EGD    Atrial fibrillation (HCC)    CAD (coronary artery disease)    s/p CABG 1993 (L-LAD, S-RI, S-D1);   echo 1/10: EF 55%;    myoview 12/09: inf MI, no ischemia   Candida esophagitis ([place]) 2013   EGD    Cataracts, bilateral    Family history of malignant neoplasm of gastrointestinal tract    Folliculitis    Hiatal hernia    HTN (hypertension)    Hyperlipidemia    Irritable bladder    Myocardial infarction [facility])    Obesity    Osteoarthritis     Past Surgical History:  Procedure Laterality Date   BLEPHAROPLASTY Bilateral 11/16/2021   upper lid   CATARACT EXTRACTION Right 07/21/2018   CHOLECYSTECTOMY     laparoscopic '92   CORONARY ARTERY BYPASS GRAFT     graft '92: LIMA-LAD, SVG - D2,R1, D1   ENTEROSCOPY  08/22/2012   Procedure: ENTEROSCOPY;  Surgeon: Juanita Craver, MD;  Location: [facility] ENDOSCOPY;  Service: Endoscopy;  Laterality: N/A;   INTRAOCULAR LENS EXCHANGE Right 07/21/2018   KNEE SURGERY     MOLE REMOVAL  2001 and 2008   PAROTIDECTOMY Right 06/18/2021   PILONIDAL CYST EXCISION      Current Outpatient Medications  Medication Instructions   acetaminophen (TYLENOL) 650 mg, Oral, 2 times daily   atorvastatin (LIPITOR) 40 mg, Oral, Daily   benazepril (LOTENSIN) 20 MG tablet TAKE 1 TABLET(20 MG) BY MOUTH  DAILY   cephALEXin (KEFLEX) 500 mg, Oral, 2 times daily   Coenzyme Q10 (COQ10) 100 MG CAPS 1 capsule, Oral, Daily   ELIQUIS 2.5 MG TABS tablet TAKE 1 TABLET(2.5 MG) BY MOUTH TWICE DAILY   FARXIGA 5 MG TABS tablet TAKE 1 TABLET(5 MG) BY MOUTH DAILY   furosemide (LASIX) 40 mg, Oral, Daily   gabapentin (NEURONTIN) 300 MG capsule One tab PO qHS for a week, then BID for a week, then TID. May double weekly to a max of 3,'600mg'$ /day   Melatonin 10 mg, Oral, Daily at bedtime   Multiple Vitamin (MULTIVITAMIN WITH MINERALS) TABS tablet 1 tablet, Oral, Daily   tamsulosin (FLOMAX) 0.4 MG CAPS capsule TAKE 1 CAPSULE(0.4 MG) BY MOUTH DAILY       Objective:   Physical Exam BP 130/78   Pulse (!) 48   Temp 97.8 F (36.6 C) (Oral)   Resp 18   Ht '5\' 11"'$  (1.803 m)   Wt 203 lb 2 oz (92.1 kg)   SpO2 96%   BMI 28.33 kg/m  General:   Well developed, NAD, BMI noted. HEENT:  Normocephalic . Face symmetric, atraumatic GU: Uncircumcised, foreskin and glans with some erythema, mild swelling, no paraphimosis.  No blisters.  No penile discharge. Skin: Not pale. Not jaundice Neurologic:  alert & oriented X3.  Speech normal, gait appropriate for age and unassisted Psych--  Cognition and judgment appear intact.  Cooperative with normal attention span and concentration.  Behavior appropriate. No anxious or depressed appearing.      Assessment     Assessment  DM: diet control, very mild neuropathy.  06/10/2022 HTN Hyperlipidemia CKD CV: --CAD, CABG 1993, Myoview 2009 no ischemia. --Atrial fibrillation -- rate control, xarelto -- chronic combined systolic/diastolic congestive heart failure Venous insufficiency, mild LE edema L>>R  LUTS    MSK: DJD, spinal stenosis, R wrist Fx in the 80s GI: --Recurrent melena: Work-up 2013 Dr. Sharlett Iles: Colonoscopy, EGD, capsule endoscopy.  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 We will treat with Lotrisone twice daily. Recommend to call if not gradually better, definitely seek medical attention if worse.  See AVS. FWilder Glademay be playing a role but he has been taking it for over a year without any problems.  Hopefully this will not recur. Vaccine advice provided, see AVS Next appointment scheduled for January 2024.

## 2022-09-04 NOTE — Patient Instructions (Addendum)
Keep the area clean and dry  Use the cream twice daily for at least 10 days.  Call if you are not gradually better in the next 2 to 3 days  Seek medical attention immediately if: Severe swelling, unable to urinate, redness spreading.   Vaccines I recommend once better: Covid booster Flu shot-high dose RSV vaccine   Your next appointment is for early January.  See you then.  Call sooner if needed

## 2022-09-05 DIAGNOSIS — C7989 Secondary malignant neoplasm of other specified sites: Secondary | ICD-10-CM | POA: Diagnosis not present

## 2022-09-05 DIAGNOSIS — H6123 Impacted cerumen, bilateral: Secondary | ICD-10-CM | POA: Diagnosis not present

## 2022-09-05 NOTE — Assessment & Plan Note (Signed)
Balanitis: sx c/w balanitis, probably related to the recent intake of antibiotics, he will stop them (has only 1 tablet left). We will treat with Lotrisone twice daily. Recommend to call if not gradually better, definitely seek medical attention if worse.  See AVS. [name] may be playing a role but he has been taking it for over a year without any problems.  Hopefully this will not recur. Vaccine advice provided, see AVS Next appointment scheduled for January 2024.

## 2022-09-11 DIAGNOSIS — C44622 Squamous cell carcinoma of skin of right upper limb, including shoulder: Secondary | ICD-10-CM | POA: Diagnosis not present

## 2022-09-23 NOTE — Progress Notes (Deleted)
HPI: FU coronary artery disease and atrial fibrillation. Patient is status post coronary artery bypassing graft in 1993 with a LIMA to the LAD, saphenous vein graft to the ramus intermedius and saphenous vein graft to the first diagonal. CT of the abdomen in January of 2009 showed no renal artery stenosis. There was no aortic aneurysm. Nuclear study in June 2014 showed scar in the mid/apical inferior wall and apex. There was a small area of ischemia in the base to mid anterior wall. The study was unchanged compared to May of 2012. The study was not gated. We have treated medically. Echocardiogram September 2022 showed ejection fraction 40 to 45%, mild to moderate left ventricular enlargement, mild left ventricular hypertrophy, mild right ventricular enlargement, moderate left atrial enlargement, mild mitral regurgitation, mild to moderate tricuspid regurgitation and mildly dilated ascending aorta at 41 mm.  Ejection fraction similar to previous.  Since last seen,   Current Outpatient Medications  Medication Sig Dispense Refill   acetaminophen (TYLENOL) 650 MG CR tablet Take 1 tablet (650 mg total) by mouth in the morning and at bedtime. 90 tablet 3   atorvastatin (LIPITOR) 40 MG tablet Take 1 tablet (40 mg total) by mouth daily. 90 tablet 2   benazepril (LOTENSIN) 20 MG tablet TAKE 1 TABLET(20 MG) BY MOUTH DAILY 90 tablet 1   cephALEXin (KEFLEX) 500 MG capsule Take 500 mg by mouth 2 (two) times daily.     clotrimazole-betamethasone (LOTRISONE) cream Apply 1 Application topically 2 (two) times daily. 30 g 0   Coenzyme Q10 (COQ10) 100 MG CAPS Take 1 capsule by mouth daily.      ELIQUIS 2.5 MG TABS tablet TAKE 1 TABLET(2.5 MG) BY MOUTH TWICE DAILY 180 tablet 1   FARXIGA 5 MG TABS tablet TAKE 1 TABLET(5 MG) BY MOUTH DAILY 90 tablet 1   furosemide (LASIX) 40 MG tablet Take 1 tablet (40 mg total) by mouth daily. 90 tablet 1   gabapentin (NEURONTIN) 300 MG capsule One tab PO qHS for a week, then BID  for a week, then TID. May double weekly to a max of 3,'600mg'$ /day (Patient not taking: Reported on 09/04/2022) 90 capsule 3   Melatonin 10 MG TABS Take 10 mg by mouth at bedtime.      Multiple Vitamin (MULTIVITAMIN WITH MINERALS) TABS tablet Take 1 tablet by mouth daily.     tamsulosin (FLOMAX) 0.4 MG CAPS capsule TAKE 1 CAPSULE(0.4 MG) BY MOUTH DAILY 90 capsule 1   No current facility-administered medications for this visit.     Past Medical History:  Diagnosis Date   Anemia    Antral gastritis 2013   EGD    Atrial fibrillation ([place])    CAD (coronary artery disease)    s/p CABG 1993 (L-LAD, S-RI, S-D1);   echo 1/10: EF 55%;    myoview 12/09: inf MI, no ischemia   Candida esophagitis ([place]) 2013   EGD    Cataracts, bilateral    Family history of malignant neoplasm of gastrointestinal tract    Folliculitis    Hiatal hernia    HTN (hypertension)    Hyperlipidemia    Irritable bladder    Myocardial infarction [facility])    Obesity    Osteoarthritis     Past Surgical History:  Procedure Laterality Date   BLEPHAROPLASTY Bilateral 11/16/2021   upper lid   CATARACT EXTRACTION Right 07/21/2018   CHOLECYSTECTOMY     laparoscopic '92   CORONARY ARTERY BYPASS GRAFT  graft '92: LIMA-LAD, SVG - D2,R1, D1   ENTEROSCOPY  08/22/2012   Procedure: ENTEROSCOPY;  Surgeon: Juanita Craver, MD;  Location: [facility];  Service: Endoscopy;  Laterality: N/A;   INTRAOCULAR LENS EXCHANGE Right 07/21/2018   KNEE SURGERY     MOLE REMOVAL  2001 and 2008   PAROTIDECTOMY Right 06/18/2021   PILONIDAL CYST EXCISION      Social History   Socioeconomic History   Marital status: Widowed    Spouse name: Not on file   Number of children: 1   Years of education: Not on file   Highest education level: Not on file  Occupational History   Occupation: Retired    Fish farm manager: RETIRED  Tobacco Use   Smoking status: Former    Years: 16.00    Types: Cigarettes    Quit date: 08/14/1964    Years since quitting:  58.1   Smokeless tobacco: Never   Tobacco comments:    quit 1965  Vaping Use   Vaping Use: Never used  Substance and Sexual Activity   Alcohol use: Not Currently    Alcohol/week: 7.0 standard drinks of alcohol    Types: 7 Shots of liquor per week   Drug use: No   Sexual activity: Not on file  Other Topics Concern   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 Married '57- widowed 02-19-2023. 1 son -'3- died '63 MVA.    Lives w/ girlfriend     Emergency contact: see DPR, brother and  Daron Offer [phone] (friend)   Social Determinants of Health   Financial Resource Strain: Low Risk  (10/24/2020)   Overall Financial Resource Strain (CARDIA)    Difficulty of Paying Living Expenses: Not hard at all  Food Insecurity: No Food Insecurity (08/01/2021)   Hunger Vital Sign    Worried About Running Out of Food in the Last Year: Never true    [place] in the Last Year: Never true  Transportation Needs: No Transportation Needs (08/01/2021)   PRAPARE - Hydrologist (Medical): No    Lack of Transportation (Non-Medical): No  Physical Activity: Sufficiently Active (08/01/2021)   Exercise Vital Sign    Days of Exercise per Week: 6 days    Minutes of Exercise per Session: 30 min  Stress: No Stress Concern Present (08/01/2021)   [place]    Feeling of Stress : Not at all  Social Connections: Moderately Isolated (08/07/2022)   Social Connection and Isolation Panel [NHANES]    Frequency of Communication with Friends and Family: Twice a week    Frequency of Social Gatherings with Friends and Family: Twice a week    Attends Religious Services: Never    Marine scientist or Organizations: Yes    Attends Archivist Meetings: Never    Marital Status: Widowed  Intimate Partner Violence: Not At Risk (08/01/2021)   Humiliation, Afraid, Rape, and Kick  questionnaire    Fear of Current or Ex-Partner: No    Emotionally Abused: No    Physically Abused: No    Sexually Abused: No    Family History  Problem Relation Age of Onset   Coronary artery disease Father        and brother   Heart attack Father        and brother-fatal   Stroke Father  Breast cancer Mother    Alzheimer's disease Mother    Colon cancer Maternal Uncle    Esophageal cancer Neg Hx    Stomach cancer Neg Hx    Rectal cancer Neg Hx    Prostate cancer Neg Hx     ROS: no fevers or chills, productive cough, hemoptysis, dysphasia, odynophagia, melena, hematochezia, dysuria, hematuria, rash, seizure activity, orthopnea, PND, pedal edema, claudication. Remaining systems are negative.  Physical Exam: Well-developed well-nourished in no acute distress.  Skin is warm and dry.  HEENT is normal.  Neck is supple.  Chest is clear to auscultation with normal expansion.  Cardiovascular exam is regular rate and rhythm.  Abdominal exam nontender or distended. No masses palpated. Extremities show no edema. neuro grossly intact  ECG- personally reviewed  A/P  1 permanent atrial fibrillation-continue beta-blocker at present dose.  Continue Xarelto.  2 coronary artery disease-no chest pain.  Continue statin.  No aspirin given need for anticoagulation.  3 hypertension-blood pressure controlled.  Continue present medical regimen.  4 hyperlipidemia-continue statin.  5 chronic combined systolic/diastolic congestive heart failure-he remains euvolemic on exam.  Continue Farxiga and diuretic at present dose.  Kirk Ruths, MD

## 2022-09-30 ENCOUNTER — Other Ambulatory Visit: Payer: Self-pay | Admitting: Cardiology

## 2022-09-30 ENCOUNTER — Ambulatory Visit: Payer: Self-pay | Admitting: Licensed Clinical Social Worker

## 2022-09-30 NOTE — Patient Outreach (Signed)
  Care Coordination   09/30/2022 Name: [name] MRN: [id number] DOB: 23-Nov-1931   Care Coordination Outreach Attempts:  An unsuccessful telephone outreach was attempted today to offer the patient information about available care coordination services as a benefit of their health plan.   Follow Up Plan:  Additional outreach attempts will be made to offer the patient care coordination information and services.   Encounter Outcome:  Pt. Visit Completed  Care Coordination Interventions Activated:  No   Care Coordination Interventions:  No, not indicated    Norva Riffle.Bradon Fester MSW, [place] Holiday representative [facility] Care Management [phone]

## 2022-09-30 NOTE — Patient Instructions (Signed)
Visit Information  Thank you for taking time to visit with me today. Please don't hesitate to contact me if I can be of assistance to you before our next scheduled telephone appointment.  Following are the goals we discussed today:   Follow up telephone call with LCSW scheduled for 10/15/22 at 2:30 PM  Please call the care guide team at [phone] if you need to cancel or reschedule your appointment.   If you are experiencing a Mental Health or [place] or need someone to talk to, please go to [facility] [place] [phone])   Following is a copy of your full plan of care:  Interventions; LCSW left phone message for client describing Care coordination services. Requested return call from client to LCSW at [phone]  [name] was given information about Care Management services by the embedded care coordination team including:  Care Management services include personalized support from designated clinical staff supervised by his physician, including individualized plan of care and coordination with other care providers 24/7 contact phone numbers for assistance for urgent and routine care needs. The patient may stop CCM services at any time (effective at the end of the month) by phone call to the office staff.  Patient agreed to services and verbal consent obtained.   Norva Riffle.Sashia Campas MSW, LCSW Licensed Clinical Social Worker [facility] Care Management [phone]

## 2022-10-07 ENCOUNTER — Ambulatory Visit: Payer: Medicare Other | Admitting: Cardiology

## 2022-10-11 DIAGNOSIS — C44320 Squamous cell carcinoma of skin of unspecified parts of face: Secondary | ICD-10-CM | POA: Diagnosis not present

## 2022-10-15 ENCOUNTER — Ambulatory Visit: Payer: Self-pay | Admitting: Licensed Clinical Social Worker

## 2022-10-15 NOTE — Patient Outreach (Signed)
  Care Coordination   10/15/2022 Name: [name] MRN: [id number] DOB: [birth year]   Care Coordination Outreach Attempts:  An unsuccessful telephone outreach was attempted today to offer the patient information about available care coordination services as a benefit of their health plan.   Follow Up Plan:  Additional outreach attempts will be made to offer the patient care coordination information and services.   Encounter Outcome:  No Answer  Care Coordination Interventions Activated:  No   Care Coordination Interventions:  No, not indicated    Norva Riffle.Jerime Arif MSW, [place] Holiday representative [facility] Care Management [phone]

## 2022-10-15 NOTE — Patient Instructions (Signed)
Visit Information  Thank you for taking time to visit with me today. Please don't hesitate to contact me if I can be of assistance to you before our next scheduled telephone appointment.  Following are the goals we discussed today:   Our next appointment is by telephone on 11/12/22 at 9:00 AM   Please call the care guide team at [phone] if you need to cancel or reschedule your appointment.   If you are experiencing a Mental Health or [place] or need someone to talk to, please go to [facility] [place] [phone])   Following is a copy of your full plan of care:   Intervention: Called client phone number, not able to speak with client. LCSW left phone message for client asking client to call LCSW at [phone].  Mr. Cogbill was given information about Care Management services by the embedded care coordination team including:  Care Management services include personalized support from designated clinical staff supervised by his physician, including individualized plan of care and coordination with other care providers 24/7 contact phone numbers for assistance for urgent and routine care needs. The patient may stop CCM services at any time (effective at the end of the month) by phone call to the office staff.  Patient agreed to services and verbal consent obtained.   Norva Riffle.Dezhane Staten MSW, [place] Holiday representative [facility] Care Management [phone]

## 2022-10-17 ENCOUNTER — Other Ambulatory Visit: Payer: Self-pay | Admitting: Cardiology

## 2022-10-21 ENCOUNTER — Other Ambulatory Visit: Payer: Self-pay | Admitting: Cardiology

## 2022-10-21 NOTE — Telephone Encounter (Addendum)
Prescription refill request for Eliquis received. Indication: AF Last office visit: 07/18/21  Thresa Ross MD  (has appt scheduled 01/06/23) Scr: 1.83 on 07/01/22 Age: 88 Weight: 92.1kg  Based on above findings Eliquis 2.'5mg'$  twice daily is the appropriate dose.  Refill approved.

## 2022-11-04 DIAGNOSIS — D485 Neoplasm of uncertain behavior of skin: Secondary | ICD-10-CM | POA: Diagnosis not present

## 2022-11-04 DIAGNOSIS — Z859 Personal history of malignant neoplasm, unspecified: Secondary | ICD-10-CM | POA: Diagnosis not present

## 2022-11-04 DIAGNOSIS — Z86008 Personal history of in-situ neoplasm of other site: Secondary | ICD-10-CM | POA: Diagnosis not present

## 2022-11-04 DIAGNOSIS — L821 Other seborrheic keratosis: Secondary | ICD-10-CM | POA: Diagnosis not present

## 2022-11-04 DIAGNOSIS — Z129 Encounter for screening for malignant neoplasm, site unspecified: Secondary | ICD-10-CM | POA: Diagnosis not present

## 2022-11-04 DIAGNOSIS — L570 Actinic keratosis: Secondary | ICD-10-CM | POA: Diagnosis not present

## 2022-11-04 DIAGNOSIS — D0439 Carcinoma in situ of skin of other parts of face: Secondary | ICD-10-CM | POA: Diagnosis not present

## 2022-11-04 DIAGNOSIS — L820 Inflamed seborrheic keratosis: Secondary | ICD-10-CM | POA: Diagnosis not present

## 2022-11-12 ENCOUNTER — Ambulatory Visit: Payer: Self-pay | Admitting: Licensed Clinical Social Worker

## 2022-11-12 NOTE — Patient Outreach (Signed)
  Care Coordination   Follow Up Phone Visit   11/12/2022 Name: [name] MRN: [id number] DOB: August 14, 1931  [name] is a 88 y.o. year old male who sees Colon Branch, MD for primary care. I spoke with  emergency contact for client, Debbrah Alar, by phone today.  What matters to the patients health and wellness today? Information on program support shared today by LCSW with emergency contact for client, Debbrah Alar   Goals Addressed             This Visit's Progress    information on program support shared with emergency contact, Debbrah Alar. Shirlee Limerick said she would share program information with client.       Interventions: LCSW talked with emergency contact Debbrah Alar, about program support for client. LCSW talked with Shirlee Limerick about program support for Nicole Kindred with RN, Pharmacy and SW support. Debbrah Alar said she would share program information with client.  LCSW gave Otilio Saber phone number for client to call LCSW at [phone].      SDOH assessments and interventions completed:  Yes     Care Coordination Interventions:  Yes, provided   Follow up plan: Follow up call scheduled for 12/16/22 at 10:00 AM    Encounter Outcome:  Pt. Visit Completed   Norva Riffle.Charnay Nazario MSW, [place] Holiday representative [facility] Care Management [phone]

## 2022-11-12 NOTE — Patient Instructions (Signed)
Visit Information  Thank you for taking time to visit with me today. Please don't hesitate to contact me if I can be of assistance to you before our next scheduled telephone appointment.  Following are the goals we discussed today:   Our next appointment is by telephone on 12/16/22 at 10:00 AM   Please call the care guide team at [phone] if you need to cancel or reschedule your appointment.   If you are experiencing a Mental Health or [place] or need someone to talk to, please go to [facility] [place] [phone])   Following is a copy of your full plan of care:   Interventions: LCSW talked with emergency contact Debbrah Alar, about program support for client. LCSW talked with Shirlee Limerick about program support for Nicole Kindred with RN, Pharmacy and SW support. Debbrah Alar said she would share program information with client.  LCSW gave Otilio Saber phone number for client to call LCSW at [phone].  Mr. Serano was given information about Care Management services by the embedded care coordination team including:  Care Management services include personalized support from designated clinical staff supervised by his physician, including individualized plan of care and coordination with other care providers 24/7 contact phone numbers for assistance for urgent and routine care needs. The patient may stop CCM services at any time (effective at the end of the month) by phone call to the office staff.  Patient agreed to services and verbal consent obtained.   Norva Riffle.Ronal Maybury MSW, [place] Holiday representative [facility] Care Management [phone]

## 2022-11-25 ENCOUNTER — Other Ambulatory Visit: Payer: Self-pay | Admitting: Internal Medicine

## 2022-11-27 ENCOUNTER — Other Ambulatory Visit: Payer: Self-pay | Admitting: Internal Medicine

## 2022-11-28 ENCOUNTER — Telehealth: Payer: Self-pay | Admitting: Internal Medicine

## 2022-11-29 ENCOUNTER — Encounter: Payer: Self-pay | Admitting: Internal Medicine

## 2022-11-29 MED ORDER — METOPROLOL SUCCINATE ER 25 MG PO TB24: 25.0000 mg | ORAL_TABLET | Freq: Every day | ORAL | 1 refills | Status: DC

## 2022-11-29 NOTE — Telephone Encounter (Signed)
Error

## 2022-11-29 NOTE — Telephone Encounter (Signed)
Please advise? No longer on med list.

## 2022-11-29 NOTE — Telephone Encounter (Signed)
Initial Comment Caller states he is needing a refill on heart medication. He stated pharmacy said the medication has been discontinued. Caller declines triage. Translation No Disp. Time Eilene Ghazi Time) Disposition Final User 11/29/2022 12:31:53 AM Send To Nurse Ferne Coe, RN, [facility] 11/29/2022 12:47:31 AM FINAL ATTEMPT MADE - message left Ronnell Freshwater RNHarrell Gave 11/29/2022 12:47:38 AM Send to RN Final Attempt Jerrell Belfast, RN, [facility] 11/29/2022 8:17:19 AM Clinical Call Yes Hardin Negus RN, Mardene Celeste Final Disposition 11/29/2022 8:17:19 AM Clinical Call Yes Hardin Negus, RN, Mardene Celeste

## 2022-11-29 NOTE — Telephone Encounter (Signed)
Pt called to follow up on reasoning for medication denial. After reviewing chart, advised that the medication was discontinued on 3.6.23 by Gust Brooms, CMA because pt was reported as no longer taking medication. Unable to find her department in system so she may be no longer with [facility] or not affiliated. Pt stated that hs doesn't remember seeing anyone by that name and wouldn't just stop taking this medication unless instructed to. Advised a note would be sent back to investigate this and see if we can resume his medication. Pt acknowledged understanding.

## 2022-11-29 NOTE — Telephone Encounter (Signed)
Patient would like to know why his metoprolol was discontinued. Please advise.

## 2022-11-29 NOTE — Addendum Note (Signed)
Addended byDamita Dunnings D on: 11/29/2022 10:59 AM   Modules accepted: Orders

## 2022-11-29 NOTE — Telephone Encounter (Signed)
Pt has been taking, will refill.

## 2022-11-29 NOTE — Telephone Encounter (Signed)
Metoprolol discontinue from the list on 02/11/2022, apparently during visit for a knee injection with Dr. Dianah Field  If the patient has been taking metoprolol all along, he feels well, and the blood pressures are normal okay to restart Metoprolol XL 25 mg 1 tablet daily.  If he has not been taking the medication,  blood pressures are okay then I do not think he needs to go back on it (he is due for a visit with me by the way)

## 2022-12-03 ENCOUNTER — Ambulatory Visit
Admission: EM | Admit: 2022-12-03 | Discharge: 2022-12-03 | Disposition: A | Discharge status: Home / Self Care | Payer: Medicare Other | Attending: Urgent Care | Admitting: Urgent Care

## 2022-12-03 DIAGNOSIS — U071 COVID-19: Secondary | ICD-10-CM | POA: Diagnosis not present

## 2022-12-03 LAB — POC SARS CORONAVIRUS 2 AG -  ED: SARS Coronavirus 2 Ag: POSITIVE — AB

## 2022-12-03 MED ORDER — MOLNUPIRAVIR EUA 200MG CAPSULE: 4.0000 | ORAL_CAPSULE | Freq: Two times a day (BID) | ORAL | 0 refills | Status: AC

## 2022-12-03 NOTE — Discharge Instructions (Addendum)
You are positive for COVID. I have prescribed you molnupiravir, and antiviral to treat COVID. You cannot take Paxlovid due to your renal function. You will take 4 capsules twice daily for 5 days. Please read the attached handouts on covid 19 and the treatment.  Please also use over-the-counter Flonase to help with your nasal congestion. Please remain in quarantine through Friday, in which case it is recommended you wear an N95 mask for an additional 5 days thereafter. If you develop severe shortness of breath, chest pain, or uncontrolled fevers, please head to the emergency room.

## 2022-12-03 NOTE — ED Provider Notes (Signed)
[name]    CSN: [id number] Arrival date & time: 12/03/22  0818      History   Chief Complaint Chief Complaint  Patient presents with   Cough    HPI [name] is a 88 y.o. male.   88 year old male presents today with concern of COVID.  States the lady he lives with was diagnosed with COVID on Sunday.  He reports on Saturday feeling "off".  Reports Sunday started with a mild dry cough.  He endorses a mild headache, clear rhinorrhea and nasal congestion.  He has not been taking any medications for his symptoms.  He denies a fever or chills. Pt does have hx of significant cardiovascular disease. Denies CP or palps.   Cough   Past Medical History:  Diagnosis Date   Anemia    Antral gastritis 2013   EGD    Atrial fibrillation ([place])    CAD (coronary artery disease)    s/p CABG 1993 (L-LAD, S-RI, S-D1);   echo 1/10: EF 55%;    myoview 12/09: inf MI, no ischemia   Candida esophagitis ([place]) 2013   EGD    Cataracts, bilateral    Family history of malignant neoplasm of gastrointestinal tract    Folliculitis    Hiatal hernia    HTN (hypertension)    Hyperlipidemia    Irritable bladder    Myocardial infarction [facility])    Obesity    Osteoarthritis     Patient Active Problem List   Diagnosis Date Noted   CKD (chronic kidney disease) stage 3, GFR 30-59 ml/min ([place]) 07/01/2022   Right leg swelling 12/17/2021   Lymphadenopathy, inguinal, right 12/17/2021   Primary osteoarthritis of right knee 07/10/2021   Lumbar spondylosis 04/16/2019   CHF (congestive heart failure) ([place]) 12/17/2016   PCP NOTES >>>>> 10/11/2015   Carpal tunnel syndrome 02/15/2014   Anemia 08/14/2012   Annual physical exam 01/08/2012   Atrial fibrillation ([place]) 03/29/2011   Venous (peripheral) insufficiency 09/04/2009   OBESITY 07/04/2009   DJD (degenerative joint disease) 07/04/2009   SLEEP DISORDER 07/04/2009   Dyspepsia 02/28/2009   DM II (diabetes mellitus, type II), controlled ([place])  04/25/2008   Hyperlipidemia 06/25/2007   Essential hypertension 06/25/2007   CAD (coronary artery disease) of artery bypass graft 06/25/2007    Past Surgical History:  Procedure Laterality Date   BLEPHAROPLASTY Bilateral 11/16/2021   upper lid   CATARACT EXTRACTION Right 07/21/2018   CHOLECYSTECTOMY     laparoscopic '92   CORONARY ARTERY BYPASS GRAFT     graft '92: LIMA-LAD, SVG - D2,R1, D1   ENTEROSCOPY  08/22/2012   Procedure: ENTEROSCOPY;  Surgeon: Juanita Craver, MD;  [facility];  Service: Endoscopy;  Laterality: N/A;   INTRAOCULAR LENS EXCHANGE Right 07/21/2018   KNEE SURGERY     MOLE REMOVAL  2001 and 2008   PAROTIDECTOMY Right 06/18/2021   PILONIDAL CYST EXCISION         Home Medications    Prior to Admission medications   Medication Sig Start Date End Date Taking? Authorizing Provider  molnupiravir EUA (LAGEVRIO) 200 mg CAPS capsule Take 4 capsules (800 mg total) by mouth 2 (two) times daily for 5 days. 12/03/22 12/08/22 Yes Prescilla Monger L, PA  acetaminophen (TYLENOL) 650 MG CR tablet Take 1 tablet (650 mg total) by mouth in the morning and at bedtime. 03/18/22   Silverio Decamp, MD  atorvastatin (LIPITOR) 40 MG tablet TAKE 1 TABLET(40 MG) BY MOUTH DAILY 10/01/22  Lelon Perla, MD  benazepril (LOTENSIN) 20 MG tablet TAKE 1 TABLET(20 MG) BY MOUTH DAILY 07/22/22   Colon Branch, MD  cephALEXin (KEFLEX) 500 MG capsule Take 500 mg by mouth 2 (two) times daily. Patient not taking: Reported on 12/03/2022 08/29/22   [provider]  clotrimazole-betamethasone (LOTRISONE) cream Apply 1 Application topically 2 (two) times daily. 09/04/22   Colon Branch, MD  Coenzyme Q10 (COQ10) 100 MG CAPS Take 1 capsule by mouth daily.     [provider]  ELIQUIS 2.5 MG TABS tablet TAKE 1 TABLET(2.5 MG) BY MOUTH TWICE DAILY 10/21/22   Lelon Perla, MD  FARXIGA 5 MG TABS tablet TAKE 1 TABLET(5 MG) BY MOUTH DAILY 06/12/22   Colon Branch, MD  furosemide  (LASIX) 40 MG tablet Take 1 tablet (40 mg total) by mouth daily. 08/19/22   Colon Branch, MD  gabapentin (NEURONTIN) 300 MG capsule One tab PO qHS for a week, then BID for a week, then TID. May double weekly to a max of 3,'600mg'$ /day Patient not taking: Reported on 09/04/2022 12/17/21   Silverio Decamp, MD  Melatonin 10 MG TABS Take 10 mg by mouth at bedtime.     [provider]  metoprolol succinate (TOPROL-XL) 25 MG 24 hr tablet Take 1 tablet (25 mg total) by mouth daily. Take with or immediately following a meal 11/29/22   Colon Branch, MD  Multiple Vitamin (MULTIVITAMIN WITH MINERALS) TABS tablet Take 1 tablet by mouth daily.    [provider]  tamsulosin (FLOMAX) 0.4 MG CAPS capsule Take 1 capsule (0.4 mg total) by mouth daily after supper. 11/25/22   Colon Branch, MD    Family History Family History  Problem Relation Age of Onset   Coronary artery disease Father        and brother   Heart attack Father        and brother-fatal   Stroke Father    Breast cancer Mother    Alzheimer's disease Mother    Colon cancer Maternal Uncle    Esophageal cancer Neg Hx    Stomach cancer Neg Hx    Rectal cancer Neg Hx    Prostate cancer Neg Hx     Social History Social History   Tobacco Use   Smoking status: Former    Years: 16.00    Types: Cigarettes    Quit date: 08/14/1964    Years since quitting: 58.3   Smokeless tobacco: Never   Tobacco comments:    quit 1965  Vaping Use   Vaping Use: Never used  Substance Use Topics   Alcohol use: Not Currently    Alcohol/week: 7.0 standard drinks of alcohol    Types: 7 Shots of liquor per week   Drug use: No     Allergies   Patient has no known allergies.   Review of Systems Review of Systems  Respiratory:  Positive for cough.   As per HPI   Physical Exam Triage Vital Signs ED Triage Vitals  Enc Vitals Group     BP 12/03/22 0859 124/64     Pulse Rate 12/03/22 0859 76     Resp 12/03/22 0859 14     Temp  12/03/22 0859 99 F (37.2 C)     Temp Source 12/03/22 0859 Oral     SpO2 12/03/22 0859 95 %     Weight --      Height --      Head  Circumference --      Peak Flow --      Pain Score 12/03/22 0858 0     Pain Loc --      Pain Edu? --      Excl. in [place]? --    No data found.  Updated Vital Signs BP 124/64 (BP Location: Left Arm)   Pulse 76   Temp 99 F (37.2 C) (Oral)   Resp 14   SpO2 95%   Visual Acuity Right Eye Distance:   Left Eye Distance:   Bilateral Distance:    Right Eye Near:   Left Eye Near:    Bilateral Near:     Physical Exam Vitals and nursing note reviewed.  Constitutional:      General: He is not in acute distress.    Appearance: Normal appearance. He is obese. He is not ill-appearing, toxic-appearing or diaphoretic.  HENT:     Head: Normocephalic and atraumatic.     Right Ear: Tympanic membrane and ear canal normal. No drainage, swelling or tenderness. No middle ear effusion. Tympanic membrane is not erythematous.     Left Ear: Tympanic membrane and ear canal normal. No drainage, swelling or tenderness.  No middle ear effusion. Tympanic membrane is not erythematous.     Nose: Rhinorrhea present. No congestion.     Mouth/Throat:     Mouth: Mucous membranes are moist. No oral lesions.     Pharynx: Oropharynx is clear. No pharyngeal swelling, oropharyngeal exudate, posterior oropharyngeal erythema or uvula swelling.     Tonsils: No tonsillar exudate or tonsillar abscesses.  Eyes:     Extraocular Movements: Extraocular movements intact.     Left eye: Normal extraocular motion.     Conjunctiva/sclera: Conjunctivae normal.     Pupils: Pupils are equal, round, and reactive to light.  Neck:     Thyroid: No thyromegaly.  Cardiovascular:     Rate and Rhythm: Normal rate.     Heart sounds: Normal heart sounds.     No friction rub. No gallop.  Pulmonary:     Effort: Pulmonary effort is normal. No respiratory distress.     Breath sounds: Normal breath sounds.  No stridor. No wheezing, rhonchi or rales.  Chest:     Chest wall: No tenderness.  Abdominal:     Palpations: Abdomen is soft.  Musculoskeletal:     Cervical back: Normal range of motion and neck supple. No rigidity or tenderness.  Lymphadenopathy:     Cervical: No cervical adenopathy.  Skin:    General: Skin is warm.     Capillary Refill: Capillary refill takes less than 2 seconds.     Coloration: Skin is not pale.     Findings: No erythema or rash.  Neurological:     General: No focal deficit present.     Mental Status: He is alert and oriented to person, place, and time.  Psychiatric:        Mood and Affect: Mood normal.        Behavior: Behavior normal.      UC Treatments / Results  Labs (all labs ordered are listed, but only abnormal results are displayed) Labs Reviewed  POC SARS CORONAVIRUS 2 AG -  ED - Abnormal; Notable for the following components:      Result Value   SARS Coronavirus 2 Ag Positive (*)    All other components within normal limits    EKG   Radiology No results found.  Procedures Procedures (including critical  care time)  Medications Ordered in UC Medications - No data to display  Initial Impression / Assessment and Plan / UC Course  I have reviewed the triage vital signs and the nursing notes.  Pertinent labs & imaging results that were available during my care of the patient were reviewed by me and considered in my medical decision making (see chart for details).     Covid 19 -COVID test is positive.  Today is patient's third day of symptoms.  He was in the treatment window.  Given comorbidities, will start antiviral therapy.  Patient's last GFR was near 30, therefore we will start molnupiravir. ER precautions reviewed   Final Clinical Impressions(s) / UC Diagnoses   Final diagnoses:  [id number]     Discharge Instructions      You are positive for COVID. I have prescribed you molnupiravir, and antiviral to treat COVID. You  cannot take Paxlovid due to your renal function. You will take 4 capsules twice daily for 5 days. Please read the attached handouts on covid 19 and the treatment.  Please also use over-the-counter Flonase to help with your nasal congestion. Please remain in quarantine through Friday, in which case it is recommended you wear an N95 mask for an additional 5 days thereafter. If you develop severe shortness of breath, chest pain, or uncontrolled fevers, please head to the emergency room.     ED Prescriptions     Medication Sig Dispense Auth. Provider   molnupiravir EUA (LAGEVRIO) 200 mg CAPS capsule Take 4 capsules (800 mg total) by mouth 2 (two) times daily for 5 days. 40 capsule Uday Jantz L, PA      PDMP not reviewed this encounter.   Chaney Malling, [place] 12/03/22 [phone]

## 2022-12-03 NOTE — ED Triage Notes (Signed)
Pt prsents with c/o cough that began Sunday and feeling "off balance"

## 2022-12-04 ENCOUNTER — Telehealth: Payer: Self-pay

## 2022-12-04 NOTE — Telephone Encounter (Signed)
TCT pt to follow up from recent visit. Pt denies any needs at this time. 

## 2022-12-11 ENCOUNTER — Ambulatory Visit (INDEPENDENT_AMBULATORY_CARE_PROVIDER_SITE_OTHER): Payer: Medicare Other | Admitting: Internal Medicine

## 2022-12-11 ENCOUNTER — Encounter: Payer: Self-pay | Admitting: Internal Medicine

## 2022-12-11 VITALS — BP 120/76 | HR 47 | Temp 98.1°F | Resp 18 | Ht 71.0 in | Wt 208.5 lb

## 2022-12-11 DIAGNOSIS — E7800 Pure hypercholesterolemia, unspecified: Secondary | ICD-10-CM

## 2022-12-11 DIAGNOSIS — I10 Essential (primary) hypertension: Secondary | ICD-10-CM | POA: Diagnosis not present

## 2022-12-11 DIAGNOSIS — E1159 Type 2 diabetes mellitus with other circulatory complications: Secondary | ICD-10-CM

## 2022-12-11 DIAGNOSIS — Z0000 Encounter for general adult medical examination without abnormal findings: Secondary | ICD-10-CM | POA: Diagnosis not present

## 2022-12-11 LAB — HEMOGLOBIN A1C: Hgb A1c MFr Bld: 6.9 % — ABNORMAL HIGH (ref 4.6–6.5)

## 2022-12-11 LAB — COMPREHENSIVE METABOLIC PANEL
ALT: 23 U/L (ref 0–53)
AST: 24 U/L (ref 0–37)
Albumin: 4.4 g/dL (ref 3.5–5.2)
Alkaline Phosphatase: 55 U/L (ref 39–117)
BUN: 29 mg/dL — ABNORMAL HIGH (ref 6–23)
CO2: 27 mEq/L (ref 19–32)
Calcium: 9.5 mg/dL (ref 8.4–10.5)
Chloride: 103 mEq/L (ref 96–112)
Creatinine, Ser: 1.66 mg/dL — ABNORMAL HIGH (ref 0.40–1.50)
GFR: 36.43 mL/min — ABNORMAL LOW (ref >60.00)
Glucose, Bld: 128 mg/dL — ABNORMAL HIGH (ref 70–99)
Potassium: 4.7 mEq/L (ref 3.5–5.1)
Sodium: 140 mEq/L (ref 135–145)
Total Bilirubin: 0.7 mg/dL (ref 0.2–1.2)
Total Protein: 7.1 g/dL (ref 6.0–8.3)

## 2022-12-11 LAB — LIPID PANEL
Cholesterol: 127 mg/dL (ref 0–200)
HDL: 31.7 mg/dL — ABNORMAL LOW (ref >39.00)
NonHDL: 95.19
Total CHOL/HDL Ratio: 4
Triglycerides: 282 mg/dL — ABNORMAL HIGH (ref 0.0–149.0)
VLDL: 56.4 mg/dL — ABNORMAL HIGH (ref 0.0–40.0)

## 2022-12-11 LAB — LDL CHOLESTEROL, DIRECT: Direct LDL: 47 mg/dL

## 2022-12-11 NOTE — Assessment & Plan Note (Signed)
--  Td 2013.  Booster recommended - PNM 23: 2013; 2022 - PNM 13 :2015  - RSV d/w pt - s/p zostavax 2016; s/p shingrex  -COVID vaccines : Patiently diagnosed with COVID, recommend booster in few weeks. - had a flu shot per pt --Cscope 02-2012 (-), no further screen per GI letter  --Prostate cancer screening: no further screening. - Healthcare POA: See AVS --Labs: CMP, A1c, TSH

## 2022-12-11 NOTE — Patient Instructions (Addendum)
Stop [place].  Vaccines I recommend: Covid booster- around 01/03/23 RSV vaccine  Check the  blood pressure regularly BP GOAL is between 110/65 and  135/85. If it is consistently higher or lower, let me know    GO TO THE LAB : Get the blood work     [place], [place] back for   checkup in 3 months     "[place] of attorney" ,  "Living will" (Advance care planning documents)  If you already have a living will or healthcare power of attorney, is recommended you bring the copy to be scanned in your chart.   The document will be available to all the doctors you see in the system.  Advance care planning is a process that supports adults in  understanding and sharing their preferences regarding future medical care.  The patient's preferences are recorded in documents called Advance Directives and the can be modified at any time while the patient is in full mental capacity.   If you don't have one, please consider create one.      More information at: [email or web address]

## 2022-12-11 NOTE — Assessment & Plan Note (Signed)
Here for CPX DM: On Farxiga, has chronic balanitis despite antifungals .  Will have to stop [place].  Will check A1c and BMP, further advised with results. HTN: BP is very good, continue Lotensin, Lasix, metoprolol.  Checking labs High cholesterol: On atorvastatin, checking labs CAD, A-fib, CHF: He seems to be doing well on current medications.  He is bradycardic but asymptomatic, able to go to the gym and exercise without symptoms. RTC 3 months

## 2022-12-11 NOTE — Progress Notes (Signed)
Subjective:    Patient ID: [name], male    DOB: [birth year], 89 y.o.   MRN: [id number]  DOS:  12/11/2022 Type of visit - description: cpx  Here for CPX. In general feels well. Specifically denies chest pain difficulty breathing. Able to exercise without any problems He continues to have balanitis. He reports some constipation ever since he started [place]. Had COVID last month, feels fully recuperated  Review of Systems  Other than above, a 14 point review of systems is negative    Past Medical History:  Diagnosis Date   Anemia    Antral gastritis 2013   EGD    Atrial fibrillation ([place])    CAD (coronary artery disease)    s/p CABG 1993 (L-LAD, S-RI, S-D1);   echo 1/10: EF 55%;    myoview 12/09: inf MI, no ischemia   Candida esophagitis ([place]) 2013   EGD    Cataracts, bilateral    Family history of malignant neoplasm of gastrointestinal tract    Folliculitis    Hiatal hernia    HTN (hypertension)    Hyperlipidemia    Irritable bladder    Myocardial infarction [facility])    Obesity    Osteoarthritis     Past Surgical History:  Procedure Laterality Date   BLEPHAROPLASTY Bilateral 11/16/2021   upper lid   CATARACT EXTRACTION Right 07/21/2018   CHOLECYSTECTOMY     laparoscopic '92   CORONARY ARTERY BYPASS GRAFT     graft '92: LIMA-LAD, SVG - D2,R1, D1   ENTEROSCOPY  08/22/2012   Procedure: ENTEROSCOPY;  Surgeon: Juanita Craver, MD;  [facility];  Service: Endoscopy;  Laterality: N/A;   INTRAOCULAR LENS EXCHANGE Right 07/21/2018   KNEE SURGERY     MOLE REMOVAL  2001 and 2008   PAROTIDECTOMY Right 06/18/2021   PILONIDAL CYST EXCISION     Social History   Socioeconomic History   Marital status: Widowed    Spouse name: Not on file   Number of children: 1   Years of education: Not on file   Highest education level: Not on file  Occupational History   Occupation: Retired    Fish farm manager: RETIRED  Tobacco Use   Smoking status: Former    Years: 16.00     Types: Cigarettes    Quit date: 08/14/1964    Years since quitting: 58.3   Smokeless tobacco: Never   Tobacco comments:    quit 1965  Vaping Use   Vaping Use: Never used  Substance and Sexual Activity   Alcohol use: Not Currently    Alcohol/week: 7.0 standard drinks of alcohol    Types: 7 Shots of liquor per week   Drug use: No   Sexual activity: Not on file  Other Topics Concern   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 Married '57- widowed 02/24/23. 1 son -'14- died '28 MVA.    Lives w/ girlfriend     Emergency contact: see DPR, brother and  Daron Offer [phone] (friend)   Social Determinants of Health   Financial Resource Strain: Low Risk  (10/24/2020)   Overall Financial Resource Strain (CARDIA)    Difficulty of Paying Living Expenses: Not hard at all  Food Insecurity: No Food Insecurity (08/01/2021)   Hunger Vital Sign    Worried About Running Out of Food in the Last Year: Never true    [place] in the Last Year:  Never true  Transportation Needs: No Transportation Needs (08/01/2021)   PRAPARE - Hydrologist (Medical): No    Lack of Transportation (Non-Medical): No  Physical Activity: Sufficiently Active (08/01/2021)   Exercise Vital Sign    Days of Exercise per Week: 6 days    Minutes of Exercise per Session: 30 min  Stress: No Stress Concern Present (08/01/2021)   [place]    Feeling of Stress : Not at all  Social Connections: Moderately Isolated (08/07/2022)   Social Connection and Isolation Panel [NHANES]    Frequency of Communication with Friends and Family: Twice a week    Frequency of Social Gatherings with Friends and Family: Twice a week    Attends Religious Services: Never    Marine scientist or Organizations: Yes    Attends Archivist Meetings: Never    Marital Status: Widowed  Intimate Partner Violence:  Not At Risk (08/01/2021)   Humiliation, Afraid, Rape, and Kick questionnaire    Fear of Current or Ex-Partner: No    Emotionally Abused: No    Physically Abused: No    Sexually Abused: No     Current Outpatient Medications  Medication Instructions   acetaminophen (TYLENOL) 650 mg, Oral, 2 times daily   atorvastatin (LIPITOR) 40 MG tablet TAKE 1 TABLET(40 MG) BY MOUTH DAILY   benazepril (LOTENSIN) 20 MG tablet TAKE 1 TABLET(20 MG) BY MOUTH DAILY   clotrimazole-betamethasone (LOTRISONE) cream 1 Application, Topical, 2 times daily   Coenzyme Q10 (COQ10) 100 MG CAPS 1 capsule, Oral, Daily   ELIQUIS 2.5 MG TABS tablet TAKE 1 TABLET(2.5 MG) BY MOUTH TWICE DAILY   furosemide (LASIX) 40 mg, Oral, Daily   Melatonin 10 mg, Oral, Daily at bedtime   metoprolol succinate (TOPROL-XL) 25 mg, Oral, Daily, Take with or immediately following a meal   Multiple Vitamin (MULTIVITAMIN WITH MINERALS) TABS tablet 1 tablet, Oral, Daily   tamsulosin (FLOMAX) 0.4 mg, Oral, Daily after supper       Objective:   Physical Exam BP 120/76   Pulse (!) 47   Temp 98.1 F (36.7 C) (Oral)   Resp 18   Ht '5\' 11"'$  (1.803 m)   Wt 208 lb 8 oz (94.6 kg)   SpO2 95%   BMI 29.08 kg/m  General: Well developed, NAD, BMI noted Neck: No  thyromegaly  HEENT:  Normocephalic . Face symmetric, atraumatic Lungs:  CTA B Normal respiratory effort, no intercostal retractions, no accessory muscle use. Heart: Irregular, bradycardic. Abdomen:  Not distended, soft, non-tender. No rebound or rigidity.   Lower extremities: no pretibial edema bilaterally  GU: foreskin slt erythematous, minimal swelling, no ulcers or blisters Skin: Exposed areas without rash. Not pale. Not jaundice Neurologic:  alert & oriented X3.  Speech normal, gait appropriate for age and unassisted Strength symmetric and appropriate for age.  Psych: Cognition and judgment appear intact.  Cooperative with normal attention span and concentration.  Behavior  appropriate. No anxious or depressed appearing.     Assessment     ASSESSMENT  DM:+ mild neuropathy.   HTN Hyperlipidemia CKD CV: --CAD, CABG 1993, Myoview 2009 no ischemia. --Atrial fibrillation -- rate control, xarelto -- chronic combined systolic/diastolic congestive heart failure Venous insufficiency, mild LE edema L>>R  LUTS    MSK: DJD, spinal stenosis, R wrist Fx in the 80s GI: --Recurrent melena: Work-up 2013 Dr. Sharlett Iles: Colonoscopy, EGD, capsule endoscopy.  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  Will have to stop FIran  Will check A1c and BMP, further advised with results. HTN: BP is very good, continue Lotensin, Lasix, metoprolol.  Checking labs High cholesterol: On atorvastatin, checking labs CAD, A-fib, CHF: He seems to be doing well on current medications.  He is bradycardic but asymptomatic, able to go to the gym and exercise without symptoms. RTC 3 months

## 2022-12-12 MED ORDER — RYBELSUS 3 MG PO TABS: 3.0000 mg | ORAL_TABLET | Freq: Every day | ORAL | 0 refills | Status: DC

## 2022-12-12 NOTE — Addendum Note (Signed)
Addended byDamita Dunnings D on: 12/12/2022 02:16 PM   Modules accepted: Orders

## 2022-12-16 ENCOUNTER — Ambulatory Visit: Payer: Self-pay | Admitting: Licensed Clinical Social Worker

## 2022-12-16 NOTE — Patient Outreach (Signed)
  Care Coordination   Follow Up Visit Note   12/16/2022 Name: [name] MRN: [id number] DOB: [birth year]  DEFORREST BOGLE is a 89 y.o. year old male who sees Larose Kells, Alda Berthold, MD for primary care. I spoke with  Florence Canner by phone today.  What matters to the patients health and wellness today?  Client said he is doing ADLs and driving to appointments. May be interested  in program support.     Goals Addressed             This Visit's Progress    Patient said he was doing ADLs and driving to appointments. May be interested in program support       Interventions LCSW talked with client today about program support. Client may be interested in program support. Client is managing medical needs but has numerous medical challenges Talked with client about RN support and pharmacy support with program . Client did not request call from RN or pharmacy representative at this time.   Spoke with Melissa about support of friend, Debbrah Alar.  He said Debbrah Alar was supportive. Reviewed medication procurement.  Reviewed pain issues. Reviewed sleeping issues.  Discussed client support with PCP, Dr. Colon Branch. Encouraged client to call LCSW at [phone] for SW support. Client was appreciative of call from LCSW.    SDOH assessments and interventions completed:  Yes  SDOH Interventions Today    Flowsheet Row Most Recent Value  SDOH Interventions   Stress Interventions Other (Comment)  [client may have some stress related to managing medical needs]  Social Connections Interventions Other (Comment)  [client may be at some risk for social isolation]        Care Coordination Interventions:  Yes, provided   Follow up plan: Follow up call scheduled for 01/15/22 at 9:30 AM    Encounter Outcome:  Pt. Visit Completed   Norva Riffle.Fatime Bis[place] MSW, [place] representative [facility] Care Management [phone]

## 2022-12-16 NOTE — Patient Instructions (Signed)
Visit Information  Thank you for taking time to visit with me today. Please don't hesitate to contact me if I can be of assistance to you before our next scheduled telephone appointment.  Following are the goals we discussed today:   Our next appointment is by telephone on 01/15/22 at 9:30 AM   Please call the care guide team at [phone] if you need to cancel or reschedule your appointment.   If you are experiencing a Mental Health or [place] or need someone to talk to, please go to [facility] [place] [phone])   Following is a copy of your full plan of care:   Interventions LCSW talked with client today about program support. Client may be interested in program support. Client is managing medical needs but has numerous medical Talked with client about RN support and pharmacy support with program . Client did not request call from RN or pharmacy representative at this time.   Spoke with [place] about support of friend, Debbrah Alar.  He said Debbrah Alar was supportive. Reviewed medication procurement.  Reviewed pain issues. Reviewed sleeping issues.  Discussed client support with PCP, Dr. Colon Branch. Encouraged client to call LCSW at [phone] for SW support. Client was appreciative of call from LCSW.  Mr. Shugart was given information about Care Management services by the embedded care coordination team including:  Care Management services include personalized support from designated clinical staff supervised by his physician, including individualized plan of care and coordination with other care providers 24/7 contact phone numbers for assistance for urgent and routine care needs. The patient may stop CCM services at any time (effective at the end of the month) by phone call to the office staff.  Patient agreed to services and verbal consent obtained.   Norva Riffle.Tambra Muller MSW, LCSW Licensed Clinical Social  Worker [facility] Care Management [phone]

## 2022-12-23 NOTE — Progress Notes (Signed)
HPI: FU coronary artery disease and atrial fibrillation. Patient is status post coronary artery bypassing graft in 1993 with LIMA to the LAD, saphenous vein graft to the ramus intermedius and saphenous vein graft to the first diagonal. CT of the abdomen in January of 2009 showed no renal artery stenosis. There was no aortic aneurysm. Nuclear study in June 2014 showed scar in the mid/apical inferior wall and apex. There was a small area of ischemia in the base to mid anterior wall. The study was unchanged compared to May of 2012. The study was not gated. We have treated medically.  Echocardiogram September 2022 showed ejection fraction 40 to 45%, mild to moderate left ventricular enlargement, mild left ventricular hypertrophy, mild right ventricular enlargement, moderate left atrial enlargement, mild mitral regurgitation, mild to moderate tricuspid regurgitation and mildly dilated ascending aorta at 41 mm.  Since last seen, he denies dyspnea, chest pain, palpitations or syncope.  No bleeding.  Current Outpatient Medications  Medication Sig Dispense Refill   acetaminophen (TYLENOL) 650 MG CR tablet Take 1 tablet (650 mg total) by mouth in the morning and at bedtime. 90 tablet 3   atorvastatin (LIPITOR) 40 MG tablet TAKE 1 TABLET(40 MG) BY MOUTH DAILY 90 tablet 2   benazepril (LOTENSIN) 20 MG tablet TAKE 1 TABLET(20 MG) BY MOUTH DAILY 90 tablet 1   clotrimazole-betamethasone (LOTRISONE) cream Apply 1 Application topically 2 (two) times daily. 30 g 0   Coenzyme Q10 (COQ10) 100 MG CAPS Take 1 capsule by mouth daily.      ELIQUIS 2.5 MG TABS tablet TAKE 1 TABLET(2.5 MG) BY MOUTH TWICE DAILY 180 tablet 1   furosemide (LASIX) 40 MG tablet Take 1 tablet (40 mg total) by mouth daily. 90 tablet 1   Melatonin 10 MG TABS Take 10 mg by mouth at bedtime.      metoprolol succinate (TOPROL-XL) 25 MG 24 hr tablet Take 1 tablet (25 mg total) by mouth daily. Take with or immediately following a meal 90 tablet 1    Multiple Vitamin (MULTIVITAMIN WITH MINERALS) TABS tablet Take 1 tablet by mouth daily.     Semaglutide (RYBELSUS) 3 MG TABS Take 3 mg by mouth daily. 30 tablet 0   tamsulosin (FLOMAX) 0.4 MG CAPS capsule Take 1 capsule (0.4 mg total) by mouth daily after supper. 90 capsule 1   No current facility-administered medications for this visit.     Past Medical History:  Diagnosis Date   Anemia    Antral gastritis 2013   EGD    Atrial fibrillation ([place])    CAD (coronary artery disease)    s/p CABG 1993 (L-LAD, S-RI, S-D1);   echo 1/10: EF 55%;    myoview 12/09: inf MI, no ischemia   Candida esophagitis ([place]) 2013   EGD    Cataracts, bilateral    Family history of malignant neoplasm of gastrointestinal tract    Folliculitis    Hiatal hernia    HTN (hypertension)    Hyperlipidemia    Irritable bladder    Myocardial infarction [facility])    Obesity    Osteoarthritis     Past Surgical History:  Procedure Laterality Date   BLEPHAROPLASTY Bilateral 11/16/2021   upper lid   CATARACT EXTRACTION Right 07/21/2018   CHOLECYSTECTOMY     laparoscopic '92   CORONARY ARTERY BYPASS GRAFT     graft '92: LIMA-LAD, SVG - D2,R1, D1   ENTEROSCOPY  08/22/2012   Procedure: ENTEROSCOPY;  Surgeon: Clayton Craver, MD;  Location: [facility];  Service: Endoscopy;  Laterality: N/A;   INTRAOCULAR LENS EXCHANGE Right 07/21/2018   KNEE SURGERY     MOLE REMOVAL  2001 and 2008   PAROTIDECTOMY Right 06/18/2021   PILONIDAL CYST EXCISION      Social History   Socioeconomic History   Marital status: Widowed    Spouse name: Not on file   Number of children: 1   Years of education: Not on file   Highest education level: Not on file  Occupational History   Occupation: Retired    Fish farm manager: RETIRED  Tobacco Use   Smoking status: Former    Years: 16.00    Types: Cigarettes    Quit date: 08/14/1964    Years since quitting: 58.4   Smokeless tobacco: Never   Tobacco comments:    quit 1965  Vaping Use   Vaping  Use: Never used  Substance and Sexual Activity   Alcohol use: Not Currently    Alcohol/week: 7.0 standard drinks of alcohol    Types: 7 Shots of liquor per week   Drug use: No   Sexual activity: Not on file  Other Topics Concern   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 Married '57- widowed March 08, 2023. 1 son -'50- died '66 MVA.    Lives w/ girlfriend     Emergency contact: see DPR, brother and  [name] [phone] (friend)   Social Determinants of Health   Financial Resource Strain: Low Risk  (10/24/2020)   Overall Financial Resource Strain (CARDIA)    Difficulty of Paying Living Expenses: Not hard at all  Food Insecurity: No Food Insecurity (08/01/2021)   Hunger Vital Sign    Worried About Running Out of Food in the Last Year: Never true    [place] in the Last Year: Never true  Transportation Needs: No Transportation Needs (08/01/2021)   PRAPARE - Hydrologist (Medical): No    Lack of Transportation (Non-Medical): No  Physical Activity: Sufficiently Active (08/01/2021)   Exercise Vital Sign    Days of Exercise per Week: 6 days    Minutes of Exercise per Session: 30 min  Stress: Stress Concern Present (12/16/2022)   [place]    Feeling of Stress : To some extent  Social Connections: Moderately Isolated (12/16/2022)   Social Connection and Isolation Panel [NHANES]    Frequency of Communication with Friends and Family: Twice a week    Frequency of Social Gatherings with Friends and Family: Twice a week    Attends Religious Services: Never    Marine scientist or Organizations: Yes    Attends Archivist Meetings: Never    Marital Status: Widowed  Intimate Partner Violence: Not At Risk (08/01/2021)   Humiliation, Afraid, Rape, and Kick questionnaire    Fear of Current or Ex-Partner: No    Emotionally Abused: No    Physically  Abused: No    Sexually Abused: No    Family History  Problem Relation Age of Onset   Coronary artery disease Father        and brother   Heart attack Father        and brother-fatal   Stroke Father    Breast cancer Mother    Alzheimer's disease Mother    Colon cancer Maternal Uncle    Esophageal cancer Neg Hx  Stomach cancer Neg Hx    Rectal cancer Neg Hx    Prostate cancer Neg Hx     ROS: no fevers or chills, productive cough, hemoptysis, dysphasia, odynophagia, melena, hematochezia, dysuria, hematuria, rash, seizure activity, orthopnea, PND, pedal edema, claudication. Remaining systems are negative.  Physical Exam: Well-developed well-nourished in no acute distress.  Skin is warm and dry.  HEENT is normal.  Neck is supple.  Chest is clear to auscultation with normal expansion.  Cardiovascular exam is irregular and bradycardic Abdominal exam nontender or distended. No masses palpated. Extremities show no edema. neuro grossly intact  ECG-atrial fibrillation at a rate of 50, no significant ST changes.  Personally reviewed  A/P  1 permanent atrial fibrillation-continue apixaban; heart rate borderline.  Decrease Toprol to 12.5 mg daily and follow.  2 coronary artery disease-he denies chest pain.  Continue statin.  No aspirin given need for apixaban.  3 chronic combined systolic/diastolic congestive heart failure-patient appears to be euvolemic.  Will continue diuretic.  [name] discontinued previously due to peroneal infection.  4 hypertension-blood pressure elevated.  However he has not taken his medications this morning and states it is typically controlled.  Continue present medications and follow. 5 hyperlipidemia-continue statin.  Kirk Ruths, MD

## 2022-12-31 DIAGNOSIS — C7989 Secondary malignant neoplasm of other specified sites: Secondary | ICD-10-CM | POA: Diagnosis not present

## 2023-01-06 ENCOUNTER — Ambulatory Visit: Payer: Medicare Other | Admitting: Cardiology

## 2023-01-06 ENCOUNTER — Encounter: Payer: Self-pay | Admitting: Cardiology

## 2023-01-06 VITALS — BP 157/70 | HR 50 | Ht 71.0 in | Wt 210.4 lb

## 2023-01-06 DIAGNOSIS — I2510 Atherosclerotic heart disease of native coronary artery without angina pectoris: Secondary | ICD-10-CM

## 2023-01-06 DIAGNOSIS — I4821 Permanent atrial fibrillation: Secondary | ICD-10-CM

## 2023-01-06 DIAGNOSIS — I5042 Chronic combined systolic (congestive) and diastolic (congestive) heart failure: Secondary | ICD-10-CM | POA: Diagnosis not present

## 2023-01-06 DIAGNOSIS — E7800 Pure hypercholesterolemia, unspecified: Secondary | ICD-10-CM | POA: Diagnosis not present

## 2023-01-06 DIAGNOSIS — I10 Essential (primary) hypertension: Secondary | ICD-10-CM | POA: Diagnosis not present

## 2023-01-06 MED ORDER — METOPROLOL SUCCINATE ER 25 MG PO TB24: 12.5000 mg | ORAL_TABLET | Freq: Every day | ORAL | 1 refills | Status: DC

## 2023-01-06 NOTE — Patient Instructions (Signed)
Medication Instructions:   REDUCE METOPROLOL TO 12.5 MG ONCE DAILY=1/2 OPF THE 25 MG TABLET ONCE DAILY  *If you need a refill on your cardiac medications before your next appointment, please call your pharmacy*    Follow-Up: At [facility], you and your health needs are our priority.  As part of our continuing mission to provide you with exceptional heart care, we have created designated Provider Care Teams.  These Care Teams include your primary Cardiologist (physician) and Advanced Practice Providers (APPs -  Physician Assistants and Nurse Practitioners) who all work together to provide you with the care you need, when you need it.  We recommend signing up for the patient portal called "MyChart".  Sign up information is provided on this After Visit Summary.  MyChart is used to connect with patients for Virtual Visits (Telemedicine).  Patients are able to view lab/test results, encounter notes, upcoming appointments, etc.  Non-urgent messages can be sent to your provider as well.   To learn more about what you can do with MyChart, go to [email or web address].    Your next appointment:   6 month(s)  Provider:   Kirk Ruths, MD

## 2023-01-11 ENCOUNTER — Other Ambulatory Visit: Payer: Self-pay | Admitting: Internal Medicine

## 2023-01-13 MED ORDER — RYBELSUS 7 MG PO TABS: 7.0000 mg | ORAL_TABLET | Freq: Every day | ORAL | 1 refills | Status: DC

## 2023-01-15 ENCOUNTER — Ambulatory Visit: Payer: Self-pay | Admitting: Licensed Clinical Social Worker

## 2023-01-15 NOTE — Patient Instructions (Signed)
Visit Information  Thank you for taking time to visit with me today. Please don't hesitate to contact me if I can be of assistance to you before our next scheduled telephone appointment.  Following are the goals we discussed today:    Our next appointment is by telephone on 02/18/22 at 11:00 AM  Please call the care guide team at [phone] if you need to cancel or reschedule your appointment.   If you are experiencing a Mental Health or [place] or need someone to talk to, please go to [facility] [place] [phone])   Following is a copy of your full plan of care:   Interventions: LCSW talked via phone today with client. [name] is interested in program support. LCSW reminded client of program support with RN, LCSW and Pharmacist Reviewed transport needs. [name] drives to his appointments and to completed errands.  Reviewed ADLs and meal procurement Reviewed medication procurement Reviewed client support with PCP, Dr. Kathlene November Reviewed client support with friend, [name] Offer Reviewed pain issues of client Client appreciative of call from LCSW. Client agreed for LCSW to call client in 5 weeks to further discuss his SW needs at that time  [name] was given information about Care Management services by the embedded care coordination team including:  Care Management services include personalized support from designated clinical staff supervised by his physician, including individualized plan of care and coordination with other care providers 24/7 contact phone numbers for assistance for urgent and routine care needs. The patient may stop CCM services at any time (effective at the end of the month) by phone call to the office staff.  Patient agreed to services and verbal consent obtained.   Norva Riffle.Zyrah Wiswell MSW, [place] Holiday representative [facility] Care Management [phone]

## 2023-01-15 NOTE — Patient Outreach (Signed)
  Care Coordination   Follow Up Visit Note   01/15/2023 Name: [name] MRN: [id number] DOB: 1931-05-25  [name] is a 89 y.o. year old male who sees Larose Kells, Alda Berthold, MD for primary care. I spoke with  [name] by phone today.  What matters to the patients health and wellness today? Patient is interested in program support. He would like LCSW to call him back in 5 weeks to further discuss program support    Goals Addressed             This Visit's Progress    patient is interested in program support. He would like LCSW to call him back in 5 weeks to further discuss program support       Interventions: LCSW talked via phone today with client. [name] is interested in program support. LCSW reminded client of program support with RN, LCSW and Pharmacist Reviewed transport needs. [place] drives to his appointments and to completed errands.  Reviewed ADLs and meal procurement Reviewed medication procurement Reviewed client support with PCP, Dr. Kathlene November Reviewed client support with friend, Daron Offer Reviewed pain issues of client Client appreciative of call from LCSW. Client agreed for LCSW to call client in 5 weeks to further discuss his SW needs at that time        SDOH assessments and interventions completed:  Yes  SDOH Interventions Today    Flowsheet Row Most Recent Value  SDOH Interventions   Stress Interventions Provide Counseling  [may have some stress related to managing medical needs]  Social Connections Interventions Other (Comment)  [client may be at some risk for social isolation]        Care Coordination Interventions:  Yes, provided   Follow up plan: Follow up call scheduled for 02/19/23 at 11:00 AM    Encounter Outcome:  Pt. Visit Completed   Norva Riffle.Khiem Gargis MSW, [place] Holiday representative [facility] Care Management [phone]

## 2023-02-19 ENCOUNTER — Ambulatory Visit: Payer: Self-pay | Admitting: Licensed Clinical Social Worker

## 2023-02-19 NOTE — Patient Instructions (Addendum)
  Visit Information  Thank you for taking time to visit with me today. Please don't hesitate to contact me if I can be of assistance to you before our next scheduled telephone appointment.  Following are the goals we discussed today:    Our next appointment is by telephone on 04/01/23 at 10:30 AM   Please call the care guide team at [phone] if you need to cancel or reschedule your appointment.   If you are experiencing a Mental Health or [place] or need someone to talk to, please go to [facility] [place] [phone])   Following is a copy of your full plan of care:   Interventions  LCSW spoke via phone today with [name], friend and client contact, about client needs [name] reported that client is having knee pain issues. [name] said client is active, playing golf 3 times weekly and doing some exercises weekly LCSW talked with [name] about program support for client with LCSW, RN and Pharmacist Reviewed medication procurement of client. Reviewed sleeping issues of client; reviewed appetite of client Discussed client support with PCP, Dr. Colon Branch. Encouraged client or [name] to call LCSW at [phone] for SW support for [name].Maurianna Benard MSW, [place] Holiday representative [facility] Care Management [phone]

## 2023-02-19 NOTE — Patient Outreach (Signed)
  Care Coordination   Follow Up Visit Note   02/19/2023 Name: [name] MRN: [id number] DOB: 1931/10/17  [name] is a 89 y.o. year old male who sees Larose Kells, Alda Berthold, MD for primary care. I spoke with Debbrah Alar, friend and client contact via phone today  What matters to the patients health and wellness today? Patient is doing ADLs and driving to and from appointments. He does speak of knee pain issues    Goals Addressed             This Visit's Progress    Patient said he was doing ADLs and driving to appointments. May be interested in program support       Interventions  LCSW spoke via phone today with Debbrah Alar, friend and client contact, about client needs Shirlee Limerick reported that client is having knee pain issues. Shirlee Limerick said client is active, playing golf 3 times weekly and doing some exercises weekly LCSW talked with Shirlee Limerick about program support for client with LCSW, RN and Pharmacist Reviewed medication procurement of client. Reviewed sleeping issues of client; reviewed appetite of client Discussed client support with PCP, Dr. Colon Branch. Encouraged client or Debbrah Alar to call LCSW at [phone] for SW support for [name]         SDOH assessments and interventions completed:  Yes  SDOH Interventions Today    Flowsheet Row Most Recent Value  SDOH Interventions   Stress Interventions Other (Comment)  [client has stress managing medical needs. Has knee pain]  Social Connections Interventions Other (Comment)  [at risk for social isolation]        Care Coordination Interventions:  Yes, provided    Interventions Today    Flowsheet Row Most Recent Value  Chronic Disease   Chronic disease during today's visit Other  [spoke with Debbrah Alar, client contact, about client needs]  General Interventions   General Interventions Discussed/Reviewed General Interventions Discussed  [discussed program support]  Exercise Interventions   Exercise Discussed/Reviewed  Exercise Reviewed  [client plays golf 3 times weekly]  Education Interventions   Education Provided Provided Education  Provided Verbal Education On Community Resources       Follow up plan: Follow up call scheduled for 04/01/23 at 10:30 AM    Encounter Outcome:  Pt. Visit Completed   Norva Riffle.Nathaneal Sommers MSW, [place] Holiday representative [facility] Care Management [phone]

## 2023-03-03 DIAGNOSIS — Z85828 Personal history of other malignant neoplasm of skin: Secondary | ICD-10-CM | POA: Diagnosis not present

## 2023-03-03 DIAGNOSIS — D485 Neoplasm of uncertain behavior of skin: Secondary | ICD-10-CM | POA: Diagnosis not present

## 2023-03-03 DIAGNOSIS — L570 Actinic keratosis: Secondary | ICD-10-CM | POA: Diagnosis not present

## 2023-03-03 DIAGNOSIS — L821 Other seborrheic keratosis: Secondary | ICD-10-CM | POA: Diagnosis not present

## 2023-03-03 DIAGNOSIS — Z86008 Personal history of in-situ neoplasm of other site: Secondary | ICD-10-CM | POA: Diagnosis not present

## 2023-03-11 ENCOUNTER — Encounter: Payer: Self-pay | Admitting: Internal Medicine

## 2023-03-11 ENCOUNTER — Ambulatory Visit: Payer: Medicare Other | Admitting: Internal Medicine

## 2023-03-20 ENCOUNTER — Encounter: Payer: Self-pay | Admitting: Internal Medicine

## 2023-04-01 ENCOUNTER — Ambulatory Visit: Payer: Self-pay | Admitting: Licensed Clinical Social Worker

## 2023-04-01 NOTE — Patient Outreach (Signed)
  Care Coordination   Follow Up Visit Note   04/01/2023 Name: [name] MRN: [id number] DOB: January 30, 1931  [name] is a 89 y.o. year old male who sees [name], Clayton Rod, MD for primary care. I spoke with  [name], contact for client, via phone today.   What matters to the patients health and wellness today? Patient has pain issues in his right knee     Goals Addressed             This Visit's Progress    Patient is having pain issues in his right knee       Interventions:  LCSW spoke via phone today with [name], contact for client, about client needs Discussed pain issues of client. Client has pain issues in his right knee Discussed activity level of client. Client plays golf 3 times weekly and also does some exercise weekly Client walks without device, though he does have some right knee pain  Discussed client support. Client has support from contact and friend [name] Reviewed transport needs. Client drives himself to appointments and to complete errands Reviewed medication procurement Discussed mood of client. [name] feels that client is in positive mood. She feels that his mood is stable Discussed meals provision for client. [name] said she likes to [place] and so she prepares some meals for client Encouraged client or [name] to call LCSW as needed for SW support for client at [phone]        SDOH assessments and interventions completed:  Yes  SDOH Interventions Today    Flowsheet Row Most Recent Value  SDOH Interventions   Stress Interventions Other (Comment)  [has some stress in managing medical needs. has pain in right knee]  Social Connections Interventions Other (Comment)  [risk of occasional social isolation]        Care Coordination Interventions:  Yes, provided   Interventions Today    Flowsheet Row Most Recent Value  Chronic Disease   Chronic disease during today's visit Other  [LCSW spoke with [name],  contact of client, via phone to discuss client needs]  General Interventions   General Interventions Discussed/Reviewed General Interventions Discussed, Community Resources  [discussed program services]  Exercise Interventions   Exercise Discussed/Reviewed Physical Activity  [client is active, playing golf 3 times weekly.]  Physical Activity Discussed/Reviewed Physical Activity Discussed  Education Interventions   Education Provided Provided Education  Provided Verbal Education On [employer]  [discussed Care Coordination support with RN, LCSW and Pharmacist]  Mental Health Interventions   Mental Health Discussed/Reviewed Anxiety, Coping Strategies  [name] feels that client has positive mood, she feels that client mood is stable]  Nutrition Interventions   Nutrition Discussed/Reviewed Nutrition Discussed  Pharmacy Interventions   Pharmacy Dicussed/Reviewed Pharmacy Topics Discussed        Follow up plan: Follow up call scheduled for 05/19/23 at 3:00 PM     Encounter Outcome:  Pt. Visit Completed   [name].[name] MSW, LCSW Licensed Visual merchandiser [name] Care Management [phone]

## 2023-04-01 NOTE — Patient Instructions (Signed)
Visit Information  Thank you for taking time to visit with me today. Please don't hesitate to contact me if I can be of assistance to you.   Following are the goals we discussed today:   Goals Addressed             This Visit's Progress    Patient is having pain issues in his right knee       Interventions:  LCSW spoke via phone today with Jesse Sans, contact for client, about client needs Discussed pain issues of client. Client has pain issues in his right knee Discussed activity level of client. Client plays golf 3 times weekly and also does some exercise weekly Client walks without device, though he does have some right knee pain  Discussed client support. Client has support from contact and friend Jesse Sans Reviewed transport needs. Client drives himself to appointments and to complete errands Reviewed medication procurement Discussed mood of client. Jesse Sans feels that client is in positive mood. She feels that his mood is stable Discussed meals provision for client. [name] said she likes to [place] and so she prepares some meals for client Encouraged client or Jesse Sans to call LCSW as needed for SW support for client at [phone]        Our next appointment is by telephone on 05/19/23 at 3:00 PM    Please call the care guide team at [phone] if you need to cancel or reschedule your appointment.   If you are experiencing a Mental Health or Behavioral Health Crisis or need someone to talk to, please go to [facility] [address], [place] [phone])   The patient / Jesse Sans, contact for client, verbalized understanding of instructions, educational materials, and care plan provided today and DECLINED offer to receive copy of patient instructions, educational materials, and care plan.   The patient / Jesse Sans, contact for client, has been provided with contact information for the care management team and has  been advised to call with any health related questions or concerns.   [name].Inaara Tye MSW, LCSW Licensed Visual merchandiser [facility] Care Management [phone]

## 2023-04-08 DIAGNOSIS — I6523 Occlusion and stenosis of bilateral carotid arteries: Secondary | ICD-10-CM | POA: Diagnosis not present

## 2023-04-08 DIAGNOSIS — C44320 Squamous cell carcinoma of skin of unspecified parts of face: Secondary | ICD-10-CM | POA: Diagnosis not present

## 2023-04-11 DIAGNOSIS — C44320 Squamous cell carcinoma of skin of unspecified parts of face: Secondary | ICD-10-CM | POA: Diagnosis not present

## 2023-04-11 DIAGNOSIS — Z08 Encounter for follow-up examination after completed treatment for malignant neoplasm: Secondary | ICD-10-CM | POA: Diagnosis not present

## 2023-04-11 DIAGNOSIS — Z85828 Personal history of other malignant neoplasm of skin: Secondary | ICD-10-CM | POA: Diagnosis not present

## 2023-04-22 ENCOUNTER — Other Ambulatory Visit: Payer: Self-pay | Admitting: Cardiology

## 2023-04-22 NOTE — Telephone Encounter (Signed)
Prescription refill request for Eliquis received. Indication:afib Last office visit:1/24 Scr:1.6 Age: 89 Weight:95.4  kg  Prescription refilled

## 2023-04-25 ENCOUNTER — Telehealth: Payer: Self-pay | Admitting: Cardiology

## 2023-04-25 NOTE — Telephone Encounter (Signed)
LM to return call to our office

## 2023-04-25 NOTE — Telephone Encounter (Signed)
Pt c/o medication issue:  1. Name of Medication: ELIQUIS 2.5 MG TABS tablet   2. How are you currently taking this medication (dosage and times per day)? As prescribed  3. Are you having a reaction (difficulty breathing--STAT)? No   4. What is your medication issue? Patient is wanting to get this medication through a pharmacy in [place]. The Fax # for sending it [phone].   He states he previously spoke with Vernona Rieger who advised him she would assist him with this.  Please advise

## 2023-04-29 MED ORDER — APIXABAN 2.5 MG PO TABS: ORAL_TABLET | ORAL | 3 refills | Status: DC

## 2023-04-29 NOTE — Telephone Encounter (Signed)
Left message for pt, prescription signed and placed in the mail to his home address. We are not allowed to fax prescriptions to [place].

## 2023-04-29 NOTE — Telephone Encounter (Signed)
LVM to call office.

## 2023-05-02 ENCOUNTER — Encounter: Payer: Self-pay | Admitting: Internal Medicine

## 2023-05-06 ENCOUNTER — Encounter: Payer: Self-pay | Admitting: Internal Medicine

## 2023-05-06 ENCOUNTER — Ambulatory Visit (INDEPENDENT_AMBULATORY_CARE_PROVIDER_SITE_OTHER): Payer: Medicare Other | Admitting: Internal Medicine

## 2023-05-06 VITALS — BP 132/62 | HR 45 | Temp 97.7°F | Resp 18 | Ht 71.0 in | Wt 210.0 lb

## 2023-05-06 DIAGNOSIS — M25562 Pain in left knee: Secondary | ICD-10-CM | POA: Diagnosis not present

## 2023-05-06 DIAGNOSIS — N1832 Chronic kidney disease, stage 3b: Secondary | ICD-10-CM

## 2023-05-06 DIAGNOSIS — I10 Essential (primary) hypertension: Secondary | ICD-10-CM

## 2023-05-06 DIAGNOSIS — E1159 Type 2 diabetes mellitus with other circulatory complications: Secondary | ICD-10-CM

## 2023-05-06 DIAGNOSIS — Z7984 Long term (current) use of oral hypoglycemic drugs: Secondary | ICD-10-CM

## 2023-05-06 DIAGNOSIS — N189 Chronic kidney disease, unspecified: Secondary | ICD-10-CM

## 2023-05-06 LAB — BASIC METABOLIC PANEL
BUN: 21 mg/dL (ref 6–23)
CO2: 28 mEq/L (ref 19–32)
Calcium: 9.2 mg/dL (ref 8.4–10.5)
Chloride: 104 mEq/L (ref 96–112)
Creatinine, Ser: 1.51 mg/dL — ABNORMAL HIGH (ref 0.40–1.50)
GFR: 40.7 mL/min — ABNORMAL LOW (ref >60.00)
Glucose, Bld: 117 mg/dL — ABNORMAL HIGH (ref 70–99)
Potassium: 4.7 mEq/L (ref 3.5–5.1)
Sodium: 140 mEq/L (ref 135–145)

## 2023-05-06 LAB — CBC WITH DIFFERENTIAL/PLATELET
Basophils Absolute: 0 10*3/uL (ref 0.0–0.1)
Basophils Relative: 0.4 % (ref 0.0–3.0)
Eosinophils Absolute: 0.1 10*3/uL (ref 0.0–0.7)
Eosinophils Relative: 2 % (ref 0.0–5.0)
HCT: 43.7 % (ref 39.0–52.0)
Hemoglobin: 14.5 g/dL (ref 13.0–17.0)
Lymphocytes Relative: 33.2 % (ref 12.0–46.0)
Lymphs Abs: 2.3 10*3/uL (ref 0.7–4.0)
MCHC: 33.1 g/dL (ref 30.0–36.0)
MCV: 96.4 fl (ref 78.0–100.0)
Monocytes Absolute: 0.5 10*3/uL (ref 0.1–1.0)
Monocytes Relative: 7.1 % (ref 3.0–12.0)
Neutro Abs: 4 10*3/uL (ref 1.4–7.7)
Neutrophils Relative %: 57.3 % (ref 43.0–77.0)
Platelets: 199 10*3/uL (ref 150.0–400.0)
RBC: 4.54 Mil/uL (ref 4.22–5.81)
RDW: 13.3 % (ref 11.5–15.5)
WBC: 6.9 10*3/uL (ref 4.0–10.5)

## 2023-05-06 LAB — HEMOGLOBIN A1C: Hgb A1c MFr Bld: 6.7 % — ABNORMAL HIGH (ref 4.6–6.5)

## 2023-05-06 NOTE — Patient Instructions (Addendum)
Vaccines I recommend: Covid booster RSV vaccine  Please bring [place] a copy of your Healthcare Power of Attorney for your chart.   Check the  blood pressure regularly BP GOAL is between 110/65 and  135/85. If it is consistently higher or lower, let me know    GO TO THE LAB : Get the blood work     GO TO THE FRONT DESK, PLEASE SCHEDULE YOUR APPOINTMENTS Come back for a checkup in 3 to 4 months

## 2023-05-06 NOTE — Progress Notes (Signed)
Subjective:    Patient ID: [name], male    DOB: 25-May-1931, 89 y.o.   MRN: [id number]  DOS:  05/06/2023 Type of visit - description: Follow-up  Since last visit started Rybelsus, good compliance, no ambulatory CBGs. Anticoagulated, denies blood in the urine or blood in the stools Denies chest pain or difficulty breathing 2 weeks ago developed pain at the left [facility].  Wt Readings from Last 3 Encounters:  05/06/23 210 lb (95.3 kg)  01/06/23 210 lb 6.4 oz (95.4 kg)  12/11/22 208 lb 8 oz (94.6 kg)     Review of Systems See above   Past Medical History:  Diagnosis Date   Anemia    Antral gastritis 2013   EGD    Atrial fibrillation (HCC)    CAD (coronary artery disease)    s/p CABG 1993 (L-LAD, S-RI, S-D1);   echo 1/10: EF 55%;    myoview 12/09: inf MI, no ischemia   Candida esophagitis (HCC) 2013   EGD    Cataracts, bilateral    Family history of malignant neoplasm of gastrointestinal tract    Folliculitis    Hiatal hernia    HTN (hypertension)    Hyperlipidemia    Irritable bladder    Myocardial infarction [facility])    Obesity    Osteoarthritis     Past Surgical History:  Procedure Laterality Date   BLEPHAROPLASTY Bilateral 11/16/2021   upper lid   CATARACT EXTRACTION Right 07/21/2018   CHOLECYSTECTOMY     laparoscopic '92   CORONARY ARTERY BYPASS GRAFT     graft '92: LIMA-LAD, SVG - D2,R1, D1   ENTEROSCOPY  08/22/2012   Procedure: ENTEROSCOPY;  Surgeon: Charna Elizabeth, MD;  Location: [facility];  Service: Endoscopy;  Laterality: N/A;   INTRAOCULAR LENS EXCHANGE Right 07/21/2018   KNEE SURGERY     MOLE REMOVAL  2001 and 2008   PAROTIDECTOMY Right 06/18/2021   PILONIDAL CYST EXCISION      Current Outpatient Medications  Medication Instructions   acetaminophen (TYLENOL) 650 mg, Oral, 2 times daily   apixaban (ELIQUIS) 2.5 MG TABS tablet TAKE 1 TABLET(2.5 MG) BY MOUTH TWICE DAILY   atorvastatin (LIPITOR)  40 MG tablet TAKE 1 TABLET(40 MG) BY MOUTH DAILY   benazepril (LOTENSIN) 20 MG tablet TAKE 1 TABLET(20 MG) BY MOUTH DAILY   clotrimazole-betamethasone (LOTRISONE) cream 1 Application, Topical, 2 times daily   Coenzyme Q10 (COQ10) 100 MG CAPS 1 capsule, Oral, Daily   furosemide (LASIX) 40 mg, Oral, Daily   Melatonin 10 mg, Oral, Daily at bedtime   metoprolol succinate (TOPROL-XL) 12.5 mg, Oral, Daily, Take with or immediately following a meal   Multiple Vitamin (MULTIVITAMIN WITH MINERALS) TABS tablet 1 tablet, Oral, Daily   Rybelsus 7 mg, Oral, Daily   tamsulosin (FLOMAX) 0.4 mg, Oral, Daily after supper       Objective:   Physical Exam BP 132/62   Pulse (!) 45   Temp 97.7 F (36.5 C) (Oral)   Resp 18   Ht 5\' 11"  (1.803 m)   Wt 210 lb (95.3 kg)   SpO2 95%   BMI 29.29 kg/m  General:   Well developed, NAD, BMI noted. HEENT:  Normocephalic . Face symmetric, atraumatic Lungs:  CTA B Normal respiratory effort, no intercostal retractions, no accessory muscle use. Heart: Bradycardic. Lower extremities: Calves symmetric, nontender.  No pretibial edema. Knees: Bony changes consistent with DJD.  No effusion.  Flexion reduced  bilaterally. Skin: Not pale. Not jaundice Neurologic:  alert & oriented X3.  Speech normal, gait and transferring somewhat limited by DJD.   Psych--  Cognition and judgment appear intact.  Cooperative with normal attention span and concentration.  Behavior appropriate. No anxious or depressed appearing.      Assessment     ASSESSMENT  DM:+ mild neuropathy.  DC Farxiga 12-2022 (balanitis) HTN Hyperlipidemia CKD CV: --CAD, CABG 1993, Myoview 2009 no ischemia. --Atrial fibrillation -- rate control, xarelto -- chronic combined systolic/diastolic congestive heart failure Venous insufficiency, mild LE edema L>>R  LUTS    MSK: DJD, spinal stenosis, R wrist Fx in the 80s GI: --Recurrent melena: Work-up 2013 Dr. Jarold Motto: Colonoscopy, EGD, capsule  endoscopy.  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 Avoiding Actos d/t h/o  CHF.  Farxiga: caused a rash) thus started  Rybelsus  currently on 7 mg daily.  Good compliance, tolerance okay except for occasional constipation for which he takes OTCs.  Plan: A1c, refer to our clinical pharmacist, cost is an issue >> switch to a different GLP-1? CKD: Remind him not to take NSAIDs, good hydration, check BMP HTN: BP looks good today, continue Lotensin, Lasix, metoprolol.  Check BMP. L knee: New issue, started hurting 2 weeks ago, no effusion, offered sports medicine referral but he likes to start with PT which is reasonable.  Referral sent. RTC 3 months.

## 2023-05-06 NOTE — Assessment & Plan Note (Signed)
DM: Last A1c 6.9 : avoiding Metformin d/t o CKD. Avoiding Actos d/t h/o  CHF.  Farxiga: caused a rash) thus started  Rybelsus  currently on 7 mg daily.  Good compliance, tolerance okay except for occasional constipation for which he takes OTCs.  Plan: A1c, refer to our clinical pharmacist, cost is an issue >> switch to a different GLP-1? CKD: Remind him not to take NSAIDs, good hydration, check BMP HTN: BP looks good today, continue Lotensin, Lasix, metoprolol.  Check BMP. L knee: New issue, started hurting 2 weeks ago, no effusion, offered sports medicine referral but he likes to start with PT which is reasonable.  Referral sent. RTC 3 months.

## 2023-05-07 ENCOUNTER — Telehealth: Payer: Self-pay

## 2023-05-07 NOTE — Progress Notes (Signed)
   Care Guide Note  05/07/2023 Name: RIKKI HAMDAN MRN: [id number] DOB: 07-02-1931  Referred by: Wanda Plump, MD Reason for referral : Care Coordination (Outreach to schedule with Pharm d )   VONTAE KAPLOWITZ is a 89 y.o. year old male who is a primary care patient of Wanda Plump, MD. STATHAM STORMONT was referred to the pharmacist for assistance related to HTN and DM.    Successful contact was made with the patient to discuss pharmacy services including being ready for the pharmacist to call at least 5 minutes before the scheduled appointment time, to have medication bottles and any blood sugar or blood pressure readings ready for review. The patient agreed to meet with the pharmacist via with the pharmacist via telephone visit on (date/time).  05/14/2023  Penne Lash, RMA Care Guide [facility]  [place], [place] [address] Direct Dial: [phone] [email or web address]

## 2023-05-13 ENCOUNTER — Other Ambulatory Visit: Payer: Self-pay

## 2023-05-13 ENCOUNTER — Encounter: Payer: Self-pay | Admitting: Rehabilitative and Restorative Service Providers"

## 2023-05-13 ENCOUNTER — Ambulatory Visit: Payer: Medicare Other | Attending: Internal Medicine | Admitting: Rehabilitative and Restorative Service Providers"

## 2023-05-13 DIAGNOSIS — M25562 Pain in left knee: Secondary | ICD-10-CM | POA: Insufficient documentation

## 2023-05-13 DIAGNOSIS — R29898 Other symptoms and signs involving the musculoskeletal system: Secondary | ICD-10-CM | POA: Diagnosis not present

## 2023-05-13 DIAGNOSIS — M6281 Muscle weakness (generalized): Secondary | ICD-10-CM | POA: Diagnosis not present

## 2023-05-13 DIAGNOSIS — G8929 Other chronic pain: Secondary | ICD-10-CM | POA: Insufficient documentation

## 2023-05-13 NOTE — Therapy (Signed)
OUTPATIENT PHYSICAL THERAPY LOWER EXTREMITY EVALUATION   Patient Name: [name] MRN: [id number] DOB:Jul 30, 1931, 89 y.o., male Today's Date: 05/13/2023  END OF SESSION:  PT End of Session - 05/13/23 0842     Visit Number 1    Number of Visits 24    Date for PT Re-Evaluation 07/16/23    Authorization Type [employer] Medicare $20 co pay    Progress Note Due on Visit 10    PT Start Time [phone]    PT Stop Time 0930    PT Time Calculation (min) 47 min    Activity Tolerance Patient tolerated treatment well             Past Medical History:  Diagnosis Date   Anemia    Antral gastritis 2013   EGD    Atrial fibrillation (HCC)    CAD (coronary artery disease)    s/p CABG 1993 (L-LAD, S-RI, S-D1);   echo 1/10: EF 55%;    myoview 12/09: inf MI, no ischemia   Candida esophagitis (HCC) 2013   EGD    Cataracts, bilateral    Family history of malignant neoplasm of gastrointestinal tract    Folliculitis    Hiatal hernia    HTN (hypertension)    Hyperlipidemia    Irritable bladder    Myocardial infarction [facility])    Obesity    Osteoarthritis    Past Surgical History:  Procedure Laterality Date   BLEPHAROPLASTY Bilateral 11/16/2021   upper lid   CATARACT EXTRACTION Right 07/21/2018   CHOLECYSTECTOMY     laparoscopic '92   CORONARY ARTERY BYPASS GRAFT     graft '92: LIMA-LAD, SVG - D2,R1, D1   ENTEROSCOPY  08/22/2012   Procedure: ENTEROSCOPY;  Surgeon: Charna Elizabeth, MD;  Location: [facility];  Service: Endoscopy;  Laterality: N/A;   INTRAOCULAR LENS EXCHANGE Right 07/21/2018   KNEE SURGERY     MOLE REMOVAL  2001 and 2008   PAROTIDECTOMY Right 06/18/2021   PILONIDAL CYST EXCISION     Patient Active Problem List   Diagnosis Date Noted   CKD (chronic kidney disease) stage 3, GFR 30-59 ml/min (HCC) 07/01/2022   Right leg swelling 12/17/2021   Lymphadenopathy, inguinal, right 12/17/2021   Primary osteoarthritis of right knee 07/10/2021   Lumbar spondylosis 04/16/2019   CHF  (congestive heart failure) (HCC) 12/17/2016   PCP NOTES >>>>> 10/11/2015   Carpal tunnel syndrome 02/15/2014   Anemia 08/14/2012   Annual physical exam 01/08/2012   Atrial fibrillation (HCC) 03/29/2011   Venous (peripheral) insufficiency 09/04/2009   OBESITY 07/04/2009   DJD (degenerative joint disease) 07/04/2009   SLEEP DISORDER 07/04/2009   Dyspepsia 02/28/2009   DM II (diabetes mellitus, type II), controlled (HCC) 04/25/2008   Hyperlipidemia 06/25/2007   Essential hypertension 06/25/2007   CAD (coronary artery disease) of artery bypass graft 06/25/2007    PCP: Dr [name]  REFERRING PROVIDER: Dr [name]  REFERRING DIAG: Lt knee pain   THERAPY DIAG:  Chronic pain of left knee  Other symptoms and signs involving the musculoskeletal system  Muscle weakness (generalized)  Rationale for Evaluation and Treatment: Rehabilitation  ONSET DATE: 04/29/23  SUBJECTIVE:   SUBJECTIVE STATEMENT: Patient reports that he noticed the onset of Lt knee pain the day after he bent his knee to the end range [facility] on 04/[facility]/23. He noticed on 04/30/23. Now pain in the Lt posterior knee through and some into the lateral leg area. Pain has gotten a little  better since the 22nd.   PERTINENT HISTORY: Arthritis bilat knees; scope Rt knee with continued limited ROM/strength Rt LE; CAD; CABG; HTN PAIN:  Are you having pain? Yes: NPRS scale: 6/10 Pain location: posterior Lt knee distal HS to proximal calf; lateral leg at fibular area  Pain description: dull; intensity varies Aggravating factors: walking; standing; squatting; bending over  Relieving factors: sitting in recliner with feet up; heat   PRECAUTIONS: None  WEIGHT BEARING RESTRICTIONS: No  FALLS:  Has patient fallen in last 6 months? No  LIVING ENVIRONMENT: Lives with: lives with an adult companion Lives in: House/apartment Stairs: Yes: Internal: 12 steps; on right going up and External: 0 steps; none Has following  equipment at home:   OCCUPATION: retired; Naval architect; golf's ~ 3 times/wk; gym 3 days/week (weights; bicycle - now just working upper body); yard work; mowing   PLOF: Independent  PATIENT GOALS: get rid of the knee pain   NEXT MD VISIT: 08/12/23  OBJECTIVE:   DIAGNOSTIC FINDINGS: arthritic changes bilat knees  PATIENT SURVEYS:  FOTO - 47; goal 60  COGNITION: Overall cognitive status: Within functional limits for tasks assessed     SENSATION: WFL  EDEMA: WFL's   MUSCLE LENGTH: Hamstrings: Right 55 deg; Left 60 deg Thomas test: Right tight deg; Left tight deg  POSTURE: rounded shoulders, forward head, and flexed trunk   PALPATION: Tightness to palpation Lt hamstring and gastroc/soleus complex   LOWER EXTREMITY ROM:  Active ROM Right eval Left eval  Hip flexion Tight  Tight   Hip extension Tight  Tight   Hip abduction    Hip adduction    Hip internal rotation Tight  Tight   Hip external rotation Tight  Tight   Knee flexion 95 104  Knee extension -11 -4  Ankle dorsiflexion    Ankle plantarflexion    Ankle inversion    Ankle eversion     (Blank rows = not tested)  LOWER EXTREMITY MMT:  MMT Right eval Left eval  Hip flexion 4+ 4  Hip extension 4 4  Hip abduction 4+ 4  Hip adduction    Hip internal rotation    Hip external rotation    Knee flexion 5 4+  Knee extension 5 5-  Ankle dorsiflexion 5 5  Ankle plantarflexion    Ankle inversion    Ankle eversion     (Blank rows = not tested)   FUNCTIONAL TESTS:  5 times sit to stand: 14.63 sec use of bilat UE's; 21 inch surface   GAIT: Distance walked: 40 Assistive device utilized: None Level of assistance: Complete Independence Comments: LE's in ER; fwd flexed posture; limp Lt LE in WB Lt; antalgic gait   [facility] Adult PT Treatment:                                                DATE: 05/13/23 Therapeutic Exercise: Supine  Hamstring stretch w/strap 30 sec x 3 Quad set towel behind knee 3 sec x 10   Mini SLR 3 sec x 10  Sitting  HS stretch 30 sec x 2  Standing  Gastro stretch 30 sec x 2  Soleus stretch 30 sec x 2 Manual Therapy:  Gait: Ambulate in clinic post treatment with improved gait pattern with increased wt bearing Lt LE in stance phase Self Care: Myofacial ball release hamstring and calf  sitting Importance of exercises    PATIENT EDUCATION:  Education details: POC; HEP Person educated: Patient Education method: Explanation, Demonstration, Tactile cues, Verbal cues, and Handouts Education comprehension: verbalized understanding, returned demonstration, verbal cues required, tactile cues required, and needs further education  HOME EXERCISE PROGRAM: Access Code: [email or web address] URL: [email or web address] Date: 05/13/2023 Prepared by: Corlis Leak  Exercises - Hooklying Hamstring Stretch with Strap  - 2 x daily - 7 x weekly - 1 sets - 3 reps - 30 sec  hold - Supine Quad Set  - 2 x daily - 7 x weekly - 1 sets - 10 reps - 3 sec  hold - Small Range Straight Leg Raise  - 2 x daily - 7 x weekly - 1 sets - 10 reps - 5 sec  hold - Seated Hamstring Stretch  - 2 x daily - 7 x weekly - 1 sets - 3 reps - 30 sec  hold - Gastroc Stretch on Wall  - 2 x daily - 7 x weekly - 1 sets - 3 reps - 30 sec  hold - Soleus Stretch on Wall  - 2 x daily - 7 x weekly - 1 sets - 3 reps - 30 sec  hold  Patient Education - Trigger Point Dry Needling  ASSESSMENT:  CLINICAL IMPRESSION: Patient is a 89 y.o. male who was seen today for physical therapy evaluation and treatment for Lt knee pain with probable injury 04/29/23 following end range flexion strain on the recumbent bike in the gym. He noticed pain the following day. Pain is in the Lt posterior hamstring and calf with some irritation along the lateral leg/fibula area. Patient has limited ROM; decreased strength; muscular tightness to palpation; abnormal gait pattern; decreased functional and recreational activities. He will benefit from  PT to address problems identified.   OBJECTIVE IMPAIRMENTS: Pain is in the Lt posterior hamstring and calf with some irritation along the lateral leg/fibula area. Patient has limited ROM; decreased strength; muscular tightness to palpation; abnormal gait pattern; decreased functional and recreational activities Abnormal gait, decreased activity tolerance, decreased balance, decreased endurance, decreased mobility, decreased ROM, decreased strength, increased fascial restrictions, impaired flexibility, improper body mechanics, postural dysfunction, and pain.   ACTIVITY LIMITATIONS: carrying, lifting, bending, standing, squatting, stairs, transfers, and locomotion level  PARTICIPATION LIMITATIONS: cleaning, laundry, community activity, and yard work  PERSONAL FACTORS: Age, Past/current experiences, and Time since onset of injury/illness/exacerbation are also affecting patient's functional outcome.   REHAB POTENTIAL: Good  CLINICAL DECISION MAKING: Stable/uncomplicated  EVALUATION COMPLEXITY: Low   GOALS: Goals reviewed with patient? Yes  SHORT TERM GOALS: Target date: 06/24/2023  Independent in initial HEP  Baseline: Goal status: INITIAL  2.  Increase Lt knee ROM to 110 flexion and -5 extension  Baseline:  Goal status: INITIAL  LONG TERM GOALS: Target date: 08/05/2023   Improved gait pattern with increased wt bearing Lt LE and more normal gait pattern  Baseline:  Goal status: INITIAL  2.  AROM Lt knee 112 flexion to 0 extension  Baseline:  Goal status: INITIAL  3.  4+/5 to 5/5 strength Lt LE  Baseline:  Goal status: INITIAL  4.  Decreased pain to no more than 2/10 for functional activities  Baseline:  Goal status: INITIAL  5.  Return to all functional and recreational activities including golf  Baseline:  Goal status: INITIAL  6.  Independent in HEP including aquatic program as indicated  Baseline:  Goal status: INITIAL      7.  Improve functional  limitation score  to 60    Baseline: 47    Goal status: Initial   PLAN:  PT FREQUENCY: 2x/week  PT DURATION: 12 weeks  PLANNED INTERVENTIONS: Therapeutic exercises, Therapeutic activity, Neuromuscular re-education, Balance training, Gait training, Patient/Family education, Self Care, Joint mobilization, Stair training, Aquatic Therapy, Electrical stimulation, Cryotherapy, Moist heat, Taping, Ultrasound, Ionotophoresis 4mg /ml Dexamethasone, Manual therapy, and Re-evaluation  PLAN FOR NEXT SESSION: review and progress exercises including myofacial ball release work for hamstrings and calf; education re joint care and body mechanics; manual work including STM/IASTM through the Lt hamstrings and calf, DN, modalities as indicated    [employer], PT 05/13/2023, 9:48 AM

## 2023-05-14 ENCOUNTER — Ambulatory Visit: Payer: Medicare Other | Admitting: Pharmacist

## 2023-05-14 ENCOUNTER — Encounter: Payer: Self-pay | Admitting: Rehabilitative and Restorative Service Providers"

## 2023-05-14 ENCOUNTER — Ambulatory Visit: Payer: Medicare Other | Admitting: Rehabilitative and Restorative Service Providers"

## 2023-05-14 DIAGNOSIS — E1159 Type 2 diabetes mellitus with other circulatory complications: Secondary | ICD-10-CM

## 2023-05-14 DIAGNOSIS — M6281 Muscle weakness (generalized): Secondary | ICD-10-CM

## 2023-05-14 DIAGNOSIS — G8929 Other chronic pain: Secondary | ICD-10-CM | POA: Diagnosis not present

## 2023-05-14 DIAGNOSIS — E7800 Pure hypercholesterolemia, unspecified: Secondary | ICD-10-CM

## 2023-05-14 DIAGNOSIS — R29898 Other symptoms and signs involving the musculoskeletal system: Secondary | ICD-10-CM

## 2023-05-14 DIAGNOSIS — M25562 Pain in left knee: Secondary | ICD-10-CM | POA: Diagnosis not present

## 2023-05-14 DIAGNOSIS — I10 Essential (primary) hypertension: Secondary | ICD-10-CM

## 2023-05-14 NOTE — Progress Notes (Signed)
05/14/2023 Name: [name] MRN: [id number] DOB: Nov 28, 1931  Chief Complaint  Patient presents with   Medication Management    Medication cost    SHAIKH ZOBELL is a 89 y.o. year old male who presented for a telephone visit.   They were referred to the pharmacist by their PCP for assistance in managing medication access.   Subjective:  Medication Access/Adherence  Current Pharmacy:  [facility] - [place], [place] - [address] [place] [address] Phone: [phone] Fax: [phone]  [id number] - [place], [place] - [address] [facility] [address] [place] [address] Phone: [phone] Fax: [phone]   Patient reports affordability concerns with their medications: Yes  - has reached Medicare Coverage gap and cost of Eliquis and Rybelsus is high.  Patient reports access/transportation concerns to their pharmacy: No  Patient reports adherence concerns with their medications:  No     Diabetes:  Current medications: Rybelsus 7mg  daily  Previously tried [place] but stopped due to balanitis and rash. No metformin due to CKD (last eGFR was 40)   Current glucose readings: hasn't been checking blood glucose recently   Patient denies hypoglycemic s/sx including no dizziness, shakiness, sweating. Patient denies hyperglycemic symptoms including no polyuria, polydipsia, polyphagia, nocturia, neuropathy, blurred vision.   Hypertension:  Current medications: metoprolol ER 25mg  - take 0.5 tablet daily and benazepril 20mg  daily   BP Readings from Last 3 Encounters:  05/06/23 132/62  01/06/23 (!) 157/70  12/11/22 120/76     Patient denies hypotensive s/sx including no dizziness, lightheadedness.  Patient denies hypertensive symptoms including no headache, chest pain, shortness of breath    Hyperlipidemia/ASCVD Risk Reduction  Current lipid lowering medications: atorvastatin 40mg  daily     Atrial Fibrillation:  Current medications: Rate Control: metoprolol Rhythm Control:  Anticoagulation Regimen: Eliquis 2.5mg  twice a day  Age = 89 yo; weight = 95 kg; Scr = 1.51 Eliquis dose adjusted based on age > 72 and Scr > 1.5 (has been higher in past)    Objective:  Lab Results  Component Value Date   HGBA1C 6.7 (H) 05/06/2023    Lab Results  Component Value Date   CREATININE 1.51 (H) 05/06/2023   BUN 21 05/06/2023   NA 140 05/06/2023   K 4.7 05/06/2023   CL 104 05/06/2023   CO2 28 05/06/2023    Lab Results  Component Value Date   CHOL 127 12/11/2022   HDL 31.70 (L) 12/11/2022   LDLCALC 47 07/01/2022   LDLDIRECT 47.0 12/11/2022   TRIG 282.0 (H) 12/11/2022   CHOLHDL 4 12/11/2022    Medications Reviewed Today     Reviewed by Henrene Pastor, RPH-CPP (Pharmacist) on 05/14/23 at 1510  Med List Status: <None>   Medication Order Taking? Sig Documenting Provider Last Dose Status Informant  acetaminophen (TYLENOL) 650 MG CR tablet [id number]  Take 1 tablet (650 mg total) by mouth in the morning and at bedtime. Monica Becton, MD  Active   apixaban (ELIQUIS) 2.5 MG TABS tablet [id number] Yes TAKE 1 TABLET(2.5 MG) BY MOUTH TWICE DAILY Jens Som Madolyn Frieze, MD Taking Active   atorvastatin (LIPITOR) 40 MG tablet [id number] Yes TAKE 1 TABLET(40 MG) BY MOUTH DAILY Jens Som Madolyn Frieze, MD Taking Active   benazepril (LOTENSIN) 20 MG tablet [id number] Yes TAKE 1 TABLET(20 MG) BY MOUTH DAILY Wanda Plump, MD Taking Active   clotrimazole-betamethasone (LOTRISONE) cream [id number]  Apply 1  Application topically 2 (two) times daily. Wanda Plump, MD  Active   Coenzyme Q10 (COQ10) 100 MG CAPS [id number] Yes Take 1 capsule by mouth daily.  [provider] Taking Active Self  furosemide (LASIX) 40 MG tablet [id number] Yes Take 1 tablet (40 mg total) by mouth daily. Wanda Plump, MD Taking Active   Melatonin 10 MG TABS [id number] Yes Take 10 mg by mouth at bedtime.  [provider] Taking Active Self  metoprolol succinate (TOPROL-XL) 25 MG 24 hr tablet [id number] Yes Take 0.5 tablets (12.5 mg total) by mouth daily. Take with or immediately following a meal Crenshaw, Madolyn Frieze, MD Taking Active   Multiple Vitamin (MULTIVITAMIN WITH MINERALS) TABS tablet [id number] Yes Take 1 tablet by mouth daily. [provider] Taking Active Self  Semaglutide (RYBELSUS) 7 MG TABS [id number] Yes Take 1 tablet (7 mg total) by mouth daily. Wanda Plump, MD Taking Active   tamsulosin [facility]) 0.4 MG CAPS capsule [id number] Yes Take 1 capsule (0.4 mg total) by mouth daily after supper. Wanda Plump, MD Taking Active               Assessment/Plan:   Diabetes: A1c at goal of < 7.0%: - Recommend to continue Rybelsus 7mg  daily - reviewed proper administration - empty stomach and only take with small sip of water.  - Recommend to check glucose 2 to 3 times per week.  - Reviewed home blood glucose goals  Fasting blood glucose goal (before meals) = 80 to 130 Blood glucose goal after a meal = less than 180   - Meets financial criteria for Rybelsus patient assistance program through [employer]. Will collaborate with provider, CPhT, and patient to pursue assistance.   Hypertension: blood pressure at goal - Reviewed long term cardiovascular and renal outcomes of uncontrolled blood pressure - Recommend to continue metoprolol and benazepril  Hyperlipidemia/ASCVD Risk Reduction:LDL at goal of < 55 (CAD + Type 2 DM) - Reviewed long term complications of uncontrolled cholesterol - Recommend to atorvastatin 40mg  daily     Afib: controlled - continue current dose of Eliquis 2.5mg  twice a day and metoprolol 12.5mg  daily - Provided #14 samples for Eliquis 5mg  - take 0.5 tablet twice a day. Also provided coupon to get 30 days free - 1 time per lifetime use.  - Unfortunately patient did NOT meet financial criteria for Eliquis or for LIS.   Follow Up Plan: 2 to 4 weeks. To check  on Rybelsus MAP  Henrene Pastor, PharmD Clinical Pharmacist [facility]

## 2023-05-14 NOTE — Therapy (Signed)
OUTPATIENT PHYSICAL THERAPY LOWER EXTREMITY TREATMENT   Patient Name: [name] MRN: [id number] DOB:Aug 09, 1931, 88 y.o., male Today's Date: 05/14/2023  END OF SESSION:  PT End of Session - 05/14/23 0807     Visit Number 2    Number of Visits 24    Date for PT Re-Evaluation 07/16/23    Authorization Type [employer] Medicare $20 co pay    Progress Note Due on Visit 10    PT Start Time 0800    PT Stop Time 0845    PT Time Calculation (min) 45 min    Activity Tolerance Patient tolerated treatment well             Past Medical History:  Diagnosis Date   Anemia    Antral gastritis 2013   EGD    Atrial fibrillation (HCC)    CAD (coronary artery disease)    s/p CABG 1993 (L-LAD, S-RI, S-D1);   echo 1/10: EF 55%;    myoview 12/09: inf MI, no ischemia   Candida esophagitis (HCC) 2013   EGD    Cataracts, bilateral    Family history of malignant neoplasm of gastrointestinal tract    Folliculitis    Hiatal hernia    HTN (hypertension)    Hyperlipidemia    Irritable bladder    Myocardial infarction [facility])    Obesity    Osteoarthritis    Past Surgical History:  Procedure Laterality Date   BLEPHAROPLASTY Bilateral 11/16/2021   upper lid   CATARACT EXTRACTION Right 07/21/2018   CHOLECYSTECTOMY     laparoscopic '92   CORONARY ARTERY BYPASS GRAFT     graft '92: LIMA-LAD, SVG - D2,R1, D1   ENTEROSCOPY  08/22/2012   Procedure: ENTEROSCOPY;  Surgeon: Charna Elizabeth, MD;  Location: [facility];  Service: Endoscopy;  Laterality: N/A;   INTRAOCULAR LENS EXCHANGE Right 07/21/2018   KNEE SURGERY     MOLE REMOVAL  2001 and 2008   PAROTIDECTOMY Right 06/18/2021   PILONIDAL CYST EXCISION     Patient Active Problem List   Diagnosis Date Noted   CKD (chronic kidney disease) stage 3, GFR 30-59 ml/min (HCC) 07/01/2022   Right leg swelling 12/17/2021   Lymphadenopathy, inguinal, right 12/17/2021   Primary osteoarthritis of right knee 07/10/2021   Lumbar spondylosis 04/16/2019   CHF  (congestive heart failure) (HCC) 12/17/2016   PCP NOTES >>>>> 10/11/2015   Carpal tunnel syndrome 02/15/2014   Anemia 08/14/2012   Annual physical exam 01/08/2012   Atrial fibrillation (HCC) 03/29/2011   Venous (peripheral) insufficiency 09/04/2009   OBESITY 07/04/2009   DJD (degenerative joint disease) 07/04/2009   SLEEP DISORDER 07/04/2009   Dyspepsia 02/28/2009   DM II (diabetes mellitus, type II), controlled (HCC) 04/25/2008   Hyperlipidemia 06/25/2007   Essential hypertension 06/25/2007   CAD (coronary artery disease) of artery bypass graft 06/25/2007    PCP: Dr [name]  REFERRING PROVIDER: Dr [name]  REFERRING DIAG: Lt knee pain   THERAPY DIAG:  Chronic pain of left knee  Other symptoms and signs involving the musculoskeletal system  Muscle weakness (generalized)  Rationale for Evaluation and Treatment: Rehabilitation  ONSET DATE: 04/29/23  SUBJECTIVE:   SUBJECTIVE STATEMENT: Patient reports that his leg felt good aftertreatment yesterday and he went to the gym. He was getting off a weight machine for upper body when he felt a pop in the knee area and he has had pain since that time. Has not tried any of the exercises because it hurt  Eval:  Patient reports that he noticed the onset of Lt knee pain the day after he [place] his [place] [facility] [employer] [facility]. He noticed on 04/30/23. Now pain in the Lt posterior knee through and some into the lateral leg area. Pain has gotten a little better since the 22nd.   PERTINENT HISTORY: Arthritis bilat knees; scope Rt knee with continued limited ROM/strength Rt LE; CAD; CABG; HTN PAIN:  Are you having pain? Yes: NPRS scale: 6/10 Pain location: posterior Lt knee distal HS to proximal calf; lateral leg at fibular area  Pain description: dull; intensity varies Aggravating factors: walking; standing; squatting; bending over  Relieving factors: sitting in recliner with feet up; heat   PRECAUTIONS:  None  WEIGHT BEARING RESTRICTIONS: No  FALLS:  Has patient fallen in last 6 months? No  OCCUPATION: retired; Naval architect; golf's ~ 3 times/wk; gym 3 days/week (weights; bicycle - now just working upper body); yard work; mowing     PATIENT GOALS: get rid of the knee pain   NEXT MD VISIT: 08/12/23  OBJECTIVE:   DIAGNOSTIC FINDINGS: arthritic changes bilat knees  PATIENT SURVEYS:  FOTO - 47; goal 60   MUSCLE LENGTH: Hamstrings: Right 55 deg; Left 60 deg Thomas test: Right tight deg; Left tight deg  POSTURE: rounded shoulders, forward head, and flexed trunk   PALPATION: Tightness to palpation Lt hamstring and gastroc/soleus complex   LOWER EXTREMITY ROM:  Active ROM Right eval Left eval  Hip flexion Tight  Tight   Hip extension Tight  Tight   Hip abduction    Hip adduction    Hip internal rotation Tight  Tight   Hip external rotation Tight  Tight   Knee flexion 95 104  Knee extension -11 -4  Ankle dorsiflexion    Ankle plantarflexion    Ankle inversion    Ankle eversion     (Blank rows = not tested)  LOWER EXTREMITY MMT:  MMT Right eval Left eval  Hip flexion 4+ 4  Hip extension 4 4  Hip abduction 4+ 4  Hip adduction    Hip internal rotation    Hip external rotation    Knee flexion 5 4+  Knee extension 5 5-  Ankle dorsiflexion 5 5  Ankle plantarflexion    Ankle inversion    Ankle eversion     (Blank rows = not tested)   FUNCTIONAL TESTS:  5 times sit to stand: 14.63 sec use of bilat UE's; 21 inch surface   GAIT: Distance walked: 40 Assistive device utilized: None Level of assistance: Complete Independence Comments: LE's in ER; fwd flexed posture; limp Lt LE in WB Lt; antalgic gait    [facility] Adult PT Treatment:                                                DATE: 05/14/23 Therapeutic Exercise: Supine  Hamstring stretch w/strap 30 sec x 3 Quad set towel behind knee 3 sec x 10  Mini SLR 3 sec x 10  Sitting  HS stretch 30 sec x 3  Standing   Gastro stretch 30 sec x 3  Soleus stretch 30 sec x 2 Manual Therapy: Skilled palpation to assess response to manual work and DN IASTM Lt HS and calf  Trigger Point Dry-Needling  Treatment instructions: Expect mild to moderate muscle  soreness. S/S of pneumothorax if dry needled over a lung field, and to seek immediate medical attention should they occur. Patient verbalized understanding of these instructions and education.  Patient Consent Given: Yes Education handout provided: Previously provided Muscles treated: Lt hamstring; Lt gastroc Electrical stimulation performed: Yes Parameters: mAmp current intensity to pt tolerance  Treatment response/outcome: decreased palpable tightness   Gait: Ambulation in clinic post treatment with improved gait pattern with increased wt bearing Lt LE in stance phase; no pain  Self Care: Myofacial ball release hamstring and calf sitting Importance of exercises  [facility] Adult PT Treatment:                                                DATE: 05/13/23 Therapeutic Exercise: Supine  Hamstring stretch w/strap 30 sec x 3 Quad set towel behind knee 3 sec x 10  Mini SLR 3 sec x 10  Sitting  HS stretch 30 sec x 3  Standing  Gastro stretch 30 sec x 2  Soleus stretch 30 sec x 2 Manual Therapy:  Gait: Ambulate in clinic post treatment with improved gait pattern with increased wt bearing Lt LE in stance phase Self Care: Myofacial ball release hamstring and calf sitting Importance of exercises    PATIENT EDUCATION:  Education details: POC; HEP Person educated: Patient Education method: Explanation, Demonstration, Tactile cues, Verbal cues, and Handouts Education comprehension: verbalized understanding, returned demonstration, verbal cues required, tactile cues required, and needs further education  HOME EXERCISE PROGRAM: Access Code: [email or web address] URL: [email or web address] Date: 05/13/2023 Prepared by: Corlis Leak  Exercises - Hooklying  Hamstring Stretch with Strap  - 2 x daily - 7 x weekly - 1 sets - 3 reps - 30 sec  hold - Supine Quad Set  - 2 x daily - 7 x weekly - 1 sets - 10 reps - 3 sec  hold - Small Range Straight Leg Raise  - 2 x daily - 7 x weekly - 1 sets - 10 reps - 5 sec  hold - Seated Hamstring Stretch  - 2 x daily - 7 x weekly - 1 sets - 3 reps - 30 sec  hold - Gastroc Stretch on Wall  - 2 x daily - 7 x weekly - 1 sets - 3 reps - 30 sec  hold - Soleus Stretch on Wall  - 2 x daily - 7 x weekly - 1 sets - 3 reps - 30 sec  hold  Patient Education - Trigger Point Dry Needling  ASSESSMENT:  CLINICAL IMPRESSION: Patient reports increased pain when getting off exercises equipment for UE's yesterday. Presents with continued tightness Lt hamstrings and gastroc/soleus complex. Reviewed exercises. Trial of manual work and DN to Lt HS and calf with good decrease in muscular tightness noted. Note improved gait pattern following treatment. Patient reports no pain in posterior thigh and calf following treatment.   OBJECTIVE IMPAIRMENTS:  Lt knee pain with probable injury 04/29/23 following end range flexion strain on the recumbent bike in the gym. He noticed pain the following day. Pain is in the Lt posterior hamstring and calf with some irritation along the lateral leg/fibula area. Patient has limited ROM; decreased strength; muscular tightness to palpation; abnormal gait pattern; decreased functional and recreational activities. Pain is in the Lt posterior hamstring and calf with some irritation along the lateral leg/fibula area. Patient has limited  ROM; decreased strength; muscular tightness to palpation; abnormal gait pattern; decreased functional and recreational activities Abnormal gait, decreased activity tolerance, decreased balance, decreased endurance, decreased mobility, decreased ROM, decreased strength, increased fascial restrictions, impaired flexibility, improper body mechanics, postural dysfunction, and pain.     GOALS: Goals reviewed with patient? Yes  SHORT TERM GOALS: Target date: 06/24/2023  Independent in initial HEP  Baseline: Goal status: INITIAL  2.  Increase Lt knee ROM to 110 flexion and -5 extension  Baseline:  Goal status: INITIAL  LONG TERM GOALS: Target date: 08/05/2023   Improved gait pattern with increased wt bearing Lt LE and more normal gait pattern  Baseline:  Goal status: INITIAL  2.  AROM Lt knee 112 flexion to 0 extension  Baseline:  Goal status: INITIAL  3.  4+/5 to 5/5 strength Lt LE  Baseline:  Goal status: INITIAL  4.  Decreased pain to no more than 2/10 for functional activities  Baseline:  Goal status: INITIAL  5.  Return to all functional and recreational activities including golf  Baseline:  Goal status: INITIAL  6.  Independent in HEP including aquatic program as indicated  Baseline:  Goal status: INITIAL      7.  Improve functional limitation score to 60    Baseline: 47    Goal status: Initial   PLAN:  PT FREQUENCY: 2x/week  PT DURATION: 12 weeks  PLANNED INTERVENTIONS: Therapeutic exercises, Therapeutic activity, Neuromuscular re-education, Balance training, Gait training, Patient/Family education, Self Care, Joint mobilization, Stair training, Aquatic Therapy, Electrical stimulation, Cryotherapy, Moist heat, Taping, Ultrasound, Ionotophoresis 4mg /ml Dexamethasone, Manual therapy, and Re-evaluation  PLAN FOR NEXT SESSION: review and progress exercises including myofacial ball release work for hamstrings and calf; education re joint care and body mechanics; manual work including STM/IASTM through the Lt hamstrings and calf, DN, modalities as indicated    [employer], PT 05/14/2023, 8:07 AM

## 2023-05-15 ENCOUNTER — Telehealth: Payer: Self-pay

## 2023-05-15 NOTE — Telephone Encounter (Signed)
-----   Message from Henrene Pastor, RPH-CPP sent at 05/14/2023  3:48 PM EDT ----- Please assist patient with medication assistance program for Rybelsus (he did not qualify for Eliquis)

## 2023-05-15 NOTE — Telephone Encounter (Signed)
Submitted application ELECTRONICALLY  for RYBELSUS to [employer] for patient assistance.   Phone: [phone]  Elzabeth Mcquerry L. CPhT [facility] (O) [phone] (F) [phone]  

## 2023-05-19 ENCOUNTER — Ambulatory Visit: Payer: Self-pay | Admitting: Licensed Clinical Social Worker

## 2023-05-19 NOTE — Patient Outreach (Signed)
  Care Coordination   Follow Up Visit Note   05/19/2023 Name: [name] MRN: [id number] DOB: November 27, 1931  [name] is a 89 y.o. year old male who sees [name], Clayton Rod, MD for primary care. I spoke with  [name] by phone today.  What matters to the patients health and wellness today?  Patient stated he is having pain issues in  his left knee    Goals Addressed             This Visit's Progress    Patient Stated he is having pain issues in his left knee       Interventions:  LCSW spoke with client  via phone today about client needs Discussed program support with client. Client said he sees Dr. Willow [name] as PCP. Client said he had appointment with Dr [name] on 05/06/23. Discussed medication procurement Discussed sleeping of client. Client said he is sleeping well. Client said he is eating adequately  Client has family support from his brother Discussed transport situation. Client drives himself to and from  medical appointments. Client drives in community as needed to complete errands Discussed physical therapy support of client. Client said he has gone to one physical therapy appointment already. He has another physical therapy appointment scheduled for tomorrow. Encouraged [name] to call LCSW as needed for SW support at [phone]        SDOH assessments and interventions completed:  Yes  SDOH Interventions Today    Flowsheet Row Most Recent Value  SDOH Interventions   Physical Activity Interventions Other (Comments)  [client has left knee pain. this may affect some of his physical activities]  Stress Interventions Other (Comment)  [client has some stress related to managing medical needs]  Social Connections Interventions Other (Comment)  [may have some risk of social isolation]        Care Coordination Interventions:  Yes, provided   Interventions Today    Flowsheet Row Most Recent Value  Chronic Disease   Chronic disease during today's visit Other  [spoke with  client about client needs]  General Interventions   General Interventions Discussed/Reviewed General Interventions Discussed, [employer] program support for client with RN, Pharmacist, and LCSW]  Exercise Interventions   Exercise Discussed/Reviewed Exercise Discussed  [client plays golf when he is able. He goes to [facility] to exercise when he is able]  Education Interventions   Education Provided Provided Education  Provided Verbal Education On [employer]  Mental Health Interventions   Mental Health Discussed/Reviewed Anxiety, Coping Strategies  [client feels that his mood is stable. Client did not mention any mood issues]  Nutrition Interventions   Nutrition Discussed/Reviewed Nutrition Discussed  Pharmacy Interventions   Pharmacy Dicussed/Reviewed Pharmacy Topics Discussed        Follow up plan: Follow up call scheduled for 07/21/23 at 2:00 PM     Encounter Outcome:  Pt. Visit Completed   [name].Sybrina Laning MSW, LCSW Licensed Visual merchandiser [facility] Care Management [phone]

## 2023-05-19 NOTE — Patient Instructions (Signed)
Visit Information  Thank you for taking time to visit with me today. Please don't hesitate to contact me if I can be of assistance to you.   Following are the goals we discussed today:   Goals Addressed             This Visit's Progress    Patient Stated he is having pain issues in his left knee       Interventions:  LCSW spoke with client  via phone today about client needs Discussed program support with client. Client said he sees Dr. [name] as PCP. Client said he had appointment with Dr [name] on 05/06/23. Discussed medication procurement Discussed sleeping of client. Client said he is sleeping well. Client said he is eating adequately  Client has family support from his brother Discussed transport situation. Client drives himself to and from  medical appointments. Client drives in community as needed to complete errands Discussed physical therapy support of client. Client said he has gone to one physical therapy appointment already. He has another physical therapy appointment scheduled for tomorrow. Encouraged Gilmer to call LCSW as needed for SW support at [phone]        Our next appointment is by telephone on 07/21/23 at 2:00 PM   Please call the care guide team at [phone] if you need to cancel or reschedule your appointment.   If you are experiencing a Mental Health or Behavioral Health Crisis or need someone to talk to, please go to [facility] [address] [place] [phone])   The patient verbalized understanding of instructions, educational materials, and care plan provided today and DECLINED offer to receive copy of patient instructions, educational materials, and care plan.   The patient has been provided with contact information for the care management team and has been advised to call with any health related questions or concerns.   [name].Khalia Gong MSW, LCSW Licensed Visual merchandiser [facility]  Management [phone]

## 2023-05-20 ENCOUNTER — Ambulatory Visit: Payer: Medicare Other | Admitting: Rehabilitative and Restorative Service Providers"

## 2023-05-20 DIAGNOSIS — M6281 Muscle weakness (generalized): Secondary | ICD-10-CM

## 2023-05-20 DIAGNOSIS — R29898 Other symptoms and signs involving the musculoskeletal system: Secondary | ICD-10-CM

## 2023-05-20 DIAGNOSIS — G8929 Other chronic pain: Secondary | ICD-10-CM | POA: Diagnosis not present

## 2023-05-20 DIAGNOSIS — M25562 Pain in left knee: Secondary | ICD-10-CM | POA: Diagnosis not present

## 2023-05-20 NOTE — Therapy (Signed)
OUTPATIENT PHYSICAL THERAPY LOWER EXTREMITY TREATMENT   Patient Name: DOMONIQUE LASOTA MRN: [id number] DOB:09-05-1931, 89 y.o., male Today's Date: 05/20/2023  END OF SESSION:  PT End of Session - 05/20/23 1456     Visit Number 3    Number of Visits 24    Date for PT Re-Evaluation 07/16/23    Authorization Type [employer] $20 co pay    Progress Note Due on Visit 10    PT Start Time 1445    PT Stop Time 1530    PT Time Calculation (min) 45 min    Activity Tolerance Patient tolerated treatment well             Past Medical History:  Diagnosis Date   Anemia    Antral gastritis 2013   EGD    Atrial fibrillation (HCC)    CAD (coronary artery disease)    s/p CABG 1993 (L-LAD, S-RI, S-D1);   echo 1/10: EF 55%;    myoview 12/09: inf MI, no ischemia   Candida esophagitis (HCC) 2013   EGD    Cataracts, bilateral    Family history of malignant neoplasm of gastrointestinal tract    Folliculitis    Hiatal hernia    HTN (hypertension)    Hyperlipidemia    Irritable bladder    Myocardial infarction [facility])    Obesity    Osteoarthritis    Past Surgical History:  Procedure Laterality Date   BLEPHAROPLASTY Bilateral 11/16/2021   upper lid   CATARACT EXTRACTION Right 07/21/2018   CHOLECYSTECTOMY     laparoscopic '92   CORONARY ARTERY BYPASS GRAFT     graft '92: LIMA-LAD, SVG - D2,R1, D1   ENTEROSCOPY  08/22/2012   Procedure: ENTEROSCOPY;  Surgeon: Charna Elizabeth, MD;  Location: [facility];  Service: Endoscopy;  Laterality: N/A;   INTRAOCULAR LENS EXCHANGE Right 07/21/2018   KNEE SURGERY     MOLE REMOVAL  2001 and 2008   PAROTIDECTOMY Right 06/18/2021   PILONIDAL CYST EXCISION     Patient Active Problem List   Diagnosis Date Noted   CKD (chronic kidney disease) stage 3, GFR 30-59 ml/min (HCC) 07/01/2022   Right leg swelling 12/17/2021   Lymphadenopathy, inguinal, right 12/17/2021   Primary osteoarthritis of right knee 07/10/2021   Lumbar spondylosis 04/16/2019   CHF  (congestive heart failure) (HCC) 12/17/2016   PCP NOTES >>>>> 10/11/2015   Carpal tunnel syndrome 02/15/2014   Anemia 08/14/2012   Annual physical exam 01/08/2012   Atrial fibrillation (HCC) 03/29/2011   Venous (peripheral) insufficiency 09/04/2009   OBESITY 07/04/2009   DJD (degenerative joint disease) 07/04/2009   SLEEP DISORDER 07/04/2009   Dyspepsia 02/28/2009   DM II (diabetes mellitus, type II), controlled (HCC) 04/25/2008   Hyperlipidemia 06/25/2007   Essential hypertension 06/25/2007   CAD (coronary artery disease) of artery bypass graft 06/25/2007    PCP: Dr [name]  REFERRING PROVIDER: Dr [name]  REFERRING DIAG: Lt knee pain   THERAPY DIAG:  Chronic pain of left knee  Other symptoms and signs involving the musculoskeletal system  Muscle weakness (generalized)  Rationale for Evaluation and Treatment: Rehabilitation  ONSET DATE: 04/29/23  SUBJECTIVE:   SUBJECTIVE STATEMENT: Patient reports that his L leg feels some better. At times he does not feel anything. Sometimes has pain in the sciatic nerve. He is still going to the gym. He was getting off a weight machine for upper body when he felt a pop in the knee area and he has had pain  since that time. Has not tried any of the exercises because it hurt  Eval: Patient reports that he noticed the onset of Lt knee pain the day after he [place] his [place] [facility] [employer] [facility]. He noticed on 04/30/23. Now pain in the Lt posterior knee through and some into the lateral leg area. Pain has gotten a little better since the 22nd.   PERTINENT HISTORY: Arthritis bilat knees; scope Rt knee with continued limited ROM/strength Rt LE; CAD; CABG; HTN PAIN:  Are you having pain? Yes: NPRS scale: 4/10 Pain location: posterior Lt knee distal HS to proximal calf; lateral leg at fibular area  Pain description: dull; intensity varies Aggravating factors: walking; standing; squatting; bending over  Relieving  factors: sitting in recliner with feet up; heat   PRECAUTIONS: None  WEIGHT BEARING RESTRICTIONS: No  FALLS:  Has patient fallen in last 6 months? No  OCCUPATION: retired; Naval architect; golf's ~ 3 times/wk; gym 3 days/week (weights; bicycle - now just working upper body); yard work; mowing    PATIENT GOALS: get rid of the knee pain   NEXT MD VISIT: 08/12/23  OBJECTIVE:   DIAGNOSTIC FINDINGS: arthritic changes bilat knees  PATIENT SURVEYS:  FOTO - 47; goal 60   MUSCLE LENGTH: Hamstrings: Right 55 deg; Left 60 deg Thomas test: Right tight deg; Left tight deg  POSTURE: rounded shoulders, forward head, and flexed trunk   PALPATION: Tightness to palpation Lt hamstring and gastroc/soleus complex   LOWER EXTREMITY ROM:  Active ROM Right eval Left eval  Hip flexion Tight  Tight   Hip extension Tight  Tight   Hip abduction    Hip adduction    Hip internal rotation Tight  Tight   Hip external rotation Tight  Tight   Knee flexion 95 104  Knee extension -11 -4  Ankle dorsiflexion    Ankle plantarflexion    Ankle inversion    Ankle eversion     (Blank rows = not tested)  LOWER EXTREMITY MMT:  MMT Right eval Left eval  Hip flexion 4+ 4  Hip extension 4 4  Hip abduction 4+ 4  Hip adduction    Hip internal rotation    Hip external rotation    Knee flexion 5 4+  Knee extension 5 5-  Ankle dorsiflexion 5 5  Ankle plantarflexion    Ankle inversion    Ankle eversion     (Blank rows = not tested)   FUNCTIONAL TESTS:  5 times sit to stand: 14.63 sec use of bilat UE's; 21 inch surface   GAIT: Distance walked: 40 Assistive device utilized: None Level of assistance: Complete Independence Comments: LE's in ER; fwd flexed posture; limp Lt LE in WB Lt; antalgic gait   [facility] Adult PT Treatment:                                                DATE: 05/20/23 Therapeutic Exercise: Supine  Hamstring stretch w/strap 30 sec x 3 Quad set towel behind knee 3 sec x 10  Mini  SLR 3 sec x 10  Neural mobilization knee to ceiling x 10  Sitting  HS stretch 30 sec x 3  Standing  Gastro stretch 30 sec x 3  Soleus stretch 30 sec x 2 Manual Therapy: Skilled palpation to assess response to  manual work and DN IASTM Lt HS and calf  Trigger Point Dry-Needling  Treatment instructions: Expect mild to moderate muscle soreness. S/S of pneumothorax if dry needled over a lung field, and to seek immediate medical attention should they occur. Patient verbalized understanding of these instructions and education.  Patient Consent Given: Yes Education handout provided: Previously provided Muscles treated: Lt hamstring; Lt gastroc Electrical stimulation performed: Yes Parameters: mAmp current intensity to pt tolerance  Treatment response/outcome: decreased palpable tightness   Gait: Ambulation in clinic post treatment with improved gait pattern with increased wt bearing Lt LE in stance phase; no pain  Self Care: Myofacial ball release hamstring and calf sitting Importance of exercises  [facility] Adult PT Treatment:                                                DATE: 05/14/23 Therapeutic Exercise: Supine  Hamstring stretch w/strap 30 sec x 3 Quad set towel behind knee 3 sec x 10  Mini SLR 3 sec x 10  Sitting  HS stretch 30 sec x 3  Standing  Gastro stretch 30 sec x 3  Soleus stretch 30 sec x 2 Manual Therapy: Skilled palpation to assess response to manual work and DN IASTM Lt HS and calf  Trigger Point Dry-Needling  Treatment instructions: Expect mild to moderate muscle soreness. S/S of pneumothorax if dry needled over a lung field, and to seek immediate medical attention should they occur. Patient verbalized understanding of these instructions and education.  Patient Consent Given: Yes Education handout provided: Previously provided Muscles treated: Lt hamstring; Lt gastroc Electrical stimulation performed: Yes Parameters: mAmp current intensity to pt tolerance   Treatment response/outcome: decreased palpable tightness   Gait: Ambulation in clinic post treatment with improved gait pattern with increased wt bearing Lt LE in stance phase; no pain  Self Care: Myofacial ball release hamstring and calf sitting Importance of exercises   PATIENT EDUCATION:  Education details: POC; HEP Person educated: Patient Education method: Explanation, Demonstration, Tactile cues, Verbal cues, and Handouts Education comprehension: verbalized understanding, returned demonstration, verbal cues required, tactile cues required, and needs further education  HOME EXERCISE PROGRAM: Access Code: [email or web address] URL: [email or web address] Date: 05/20/2023 Prepared by: Corlis Leak  Exercises - Hooklying Hamstring Stretch with Strap  - 2 x daily - 7 x weekly - 1 sets - 3 reps - 30 sec  hold - Supine Quad Set  - 2 x daily - 7 x weekly - 1 sets - 10 reps - 3 sec  hold - Small Range Straight Leg Raise  - 2 x daily - 7 x weekly - 1 sets - 10 reps - 5 sec  hold - Seated Hamstring Stretch  - 2 x daily - 7 x weekly - 1 sets - 3 reps - 30 sec  hold - Gastroc Stretch on Wall  - 2 x daily - 7 x weekly - 1 sets - 3 reps - 30 sec  hold - Soleus Stretch on Wall  - 2 x daily - 7 x weekly - 1 sets - 3 reps - 30 sec  hold - Supine Sciatic Nerve Glide  - 2 x daily - 7 x weekly - 1 sets - 8-10 reps - 1-2 sec  hold  Patient Education - Trigger Point Dry Needling  ASSESSMENT:  CLINICAL IMPRESSION: Patient reports some improvement in the  Lt thigh pain. He has some periods of time when he feels discomfort but the pain is not as intense. Cleven has  continued tightness Lt hamstrings and gastroc/soleus complex. Reviewed exercises. Added trial of neural mobilization and continued with manual work and DN to Lt HS and calf with good decrease in muscular tightness noted. Note improved gait pattern following treatment. Patient reports no pain in posterior thigh and calf following treatment.  Progressing toward stated goals of therapy.   OBJECTIVE IMPAIRMENTS:  Lt knee pain with probable injury 04/29/23 following end range flexion strain on the recumbent bike in the gym. He noticed pain the following day. Pain is in the Lt posterior hamstring and calf with some irritation along the lateral leg/fibula area. Patient has limited ROM; decreased strength; muscular tightness to palpation; abnormal gait pattern; decreased functional and recreational activities. Pain is in the Lt posterior hamstring and calf with some irritation along the lateral leg/fibula area. Patient has limited ROM; decreased strength; muscular tightness to palpation; abnormal gait pattern; decreased functional and recreational activities Abnormal gait, decreased activity tolerance, decreased balance, decreased endurance, decreased mobility, decreased ROM, decreased strength, increased fascial restrictions, impaired flexibility, improper body mechanics, postural dysfunction, and pain.    GOALS: Goals reviewed with patient? Yes  SHORT TERM GOALS: Target date: 06/24/2023  Independent in initial HEP  Baseline: Goal status: INITIAL  2.  Increase Lt knee ROM to 110 flexion and -5 extension  Baseline:  Goal status: INITIAL  LONG TERM GOALS: Target date: 08/05/2023   Improved gait pattern with increased wt bearing Lt LE and more normal gait pattern  Baseline:  Goal status: INITIAL  2.  AROM Lt knee 112 flexion to 0 extension  Baseline:  Goal status: INITIAL  3.  4+/5 to 5/5 strength Lt LE  Baseline:  Goal status: INITIAL  4.  Decreased pain to no more than 2/10 for functional activities  Baseline:  Goal status: INITIAL  5.  Return to all functional and recreational activities including golf  Baseline:  Goal status: INITIAL  6.  Independent in HEP including aquatic program as indicated  Baseline:  Goal status: INITIAL      7.  Improve functional limitation score to 60    Baseline: 47    Goal status:  Initial   PLAN:  PT FREQUENCY: 2x/week  PT DURATION: 12 weeks  PLANNED INTERVENTIONS: Therapeutic exercises, Therapeutic activity, Neuromuscular re-education, Balance training, Gait training, Patient/Family education, Self Care, Joint mobilization, Stair training, Aquatic Therapy, Electrical stimulation, Cryotherapy, Moist heat, Taping, Ultrasound, Ionotophoresis 4mg /ml Dexamethasone, Manual therapy, and Re-evaluation  PLAN FOR NEXT SESSION: review and progress exercises including myofacial ball release work for hamstrings and calf; education re joint care and body mechanics; manual work including STM/IASTM through the Lt hamstrings and calf, DN, modalities as indicated    [employer], PT 05/20/2023, 2:57 PM

## 2023-05-22 ENCOUNTER — Encounter: Payer: Self-pay | Admitting: Rehabilitative and Restorative Service Providers"

## 2023-05-22 ENCOUNTER — Ambulatory Visit: Payer: Medicare Other | Admitting: Rehabilitative and Restorative Service Providers"

## 2023-05-22 DIAGNOSIS — G8929 Other chronic pain: Secondary | ICD-10-CM | POA: Diagnosis not present

## 2023-05-22 DIAGNOSIS — M6281 Muscle weakness (generalized): Secondary | ICD-10-CM | POA: Diagnosis not present

## 2023-05-22 DIAGNOSIS — R29898 Other symptoms and signs involving the musculoskeletal system: Secondary | ICD-10-CM

## 2023-05-22 DIAGNOSIS — M25562 Pain in left knee: Secondary | ICD-10-CM | POA: Diagnosis not present

## 2023-05-22 NOTE — Therapy (Signed)
OUTPATIENT PHYSICAL THERAPY LOWER EXTREMITY TREATMENT   Patient Name: [name] MRN: [id number] DOB:May 01, 1931, 89 y.o., male Today's Date: 05/22/2023  END OF SESSION:  PT End of Session - 05/22/23 0848     Visit Number 4    Number of Visits 24    Date for PT Re-Evaluation 07/16/23    Authorization Type [employer] $20 co pay    Progress Note Due on Visit 10    PT Start Time 0845    PT Stop Time 0930    PT Time Calculation (min) 45 min    Activity Tolerance Patient tolerated treatment well             Past Medical History:  Diagnosis Date   Anemia    Antral gastritis 2013   EGD    Atrial fibrillation (HCC)    CAD (coronary artery disease)    s/p CABG 1993 (L-LAD, S-RI, S-D1);   echo 1/10: EF 55%;    myoview 12/09: inf MI, no ischemia   Candida esophagitis (HCC) 2013   EGD    Cataracts, bilateral    Family history of malignant neoplasm of gastrointestinal tract    Folliculitis    Hiatal hernia    HTN (hypertension)    Hyperlipidemia    Irritable bladder    Myocardial infarction [facility])    Obesity    Osteoarthritis    Past Surgical History:  Procedure Laterality Date   BLEPHAROPLASTY Bilateral 11/16/2021   upper lid   CATARACT EXTRACTION Right 07/21/2018   CHOLECYSTECTOMY     laparoscopic '92   CORONARY ARTERY BYPASS GRAFT     graft '92: LIMA-LAD, SVG - D2,R1, D1   ENTEROSCOPY  08/22/2012   Procedure: ENTEROSCOPY;  Surgeon: Charna Elizabeth, MD;  Location: [facility];  Service: Endoscopy;  Laterality: N/A;   INTRAOCULAR LENS EXCHANGE Right 07/21/2018   KNEE SURGERY     MOLE REMOVAL  2001 and 2008   PAROTIDECTOMY Right 06/18/2021   PILONIDAL CYST EXCISION     Patient Active Problem List   Diagnosis Date Noted   CKD (chronic kidney disease) stage 3, GFR 30-59 ml/min (HCC) 07/01/2022   Right leg swelling 12/17/2021   Lymphadenopathy, inguinal, right 12/17/2021   Primary osteoarthritis of right knee 07/10/2021   Lumbar spondylosis 04/16/2019   CHF  (congestive heart failure) (HCC) 12/17/2016   PCP NOTES >>>>> 10/11/2015   Carpal tunnel syndrome 02/15/2014   Anemia 08/14/2012   Annual physical exam 01/08/2012   Atrial fibrillation (HCC) 03/29/2011   Venous (peripheral) insufficiency 09/04/2009   OBESITY 07/04/2009   DJD (degenerative joint disease) 07/04/2009   SLEEP DISORDER 07/04/2009   Dyspepsia 02/28/2009   DM II (diabetes mellitus, type II), controlled (HCC) 04/25/2008   Hyperlipidemia 06/25/2007   Essential hypertension 06/25/2007   CAD (coronary artery disease) of artery bypass graft 06/25/2007    PCP: Dr [name]  REFERRING PROVIDER: Dr [name]  REFERRING DIAG: Lt knee pain   THERAPY DIAG:  Chronic pain of left knee  Other symptoms and signs involving the musculoskeletal system  Muscle weakness (generalized)  Rationale for Evaluation and Treatment: Rehabilitation  ONSET DATE: 04/29/23  SUBJECTIVE:   SUBJECTIVE STATEMENT: Patient reports that his L leg feels better. He has some aching and soreness. At times he does not feel anything. No sciatic nerve pain. He is still going to the gym.   Eval: Patient reports that he noticed the onset of Lt knee pain the day after he bent his knee to  [place] [employer] [place]. He noticed on 04/30/23. Now pain in the Lt posterior knee through and some into the lateral leg area. Pain has gotten a little better since the 22nd.   PERTINENT HISTORY: Arthritis bilat knees; scope Rt knee with continued limited ROM/strength Rt LE; CAD; CABG; HTN PAIN:  Are you having pain? Yes: NPRS scale: 4/10 Pain location: posterior Lt knee distal HS to proximal calf; lateral leg at fibular area  Pain description: dull; ache Aggravating factors: walking; standing; squatting; bending over  Relieving factors: sitting in recliner with feet up; heat   PRECAUTIONS: None  WEIGHT BEARING RESTRICTIONS: No  FALLS:  Has patient fallen in last 6 months? No  OCCUPATION: retired;  [employer]; golf's ~ 3 times/wk; gym 3 days/week (weights; bicycle - now just working upper body); yard work; mowing    PATIENT GOALS: get rid of the knee pain   NEXT MD VISIT: 08/12/23  OBJECTIVE:   DIAGNOSTIC FINDINGS: arthritic changes bilat knees  PATIENT SURVEYS:  FOTO - 47; goal 60   MUSCLE LENGTH: Hamstrings: Right 55 deg; Left 60 deg Thomas test: Right tight deg; Left tight deg  POSTURE: rounded shoulders, forward head, and flexed trunk   PALPATION: Tightness to palpation Lt hamstring and gastroc/soleus complex   LOWER EXTREMITY ROM:  Active ROM Right eval Left eval  Hip flexion Tight  Tight   Hip extension Tight  Tight   Hip abduction    Hip adduction    Hip internal rotation Tight  Tight   Hip external rotation Tight  Tight   Knee flexion 95 104  Knee extension -11 -4  Ankle dorsiflexion    Ankle plantarflexion    Ankle inversion    Ankle eversion     (Blank rows = not tested)  LOWER EXTREMITY MMT:  MMT Right eval Left eval  Hip flexion 4+ 4  Hip extension 4 4  Hip abduction 4+ 4  Hip adduction    Hip internal rotation    Hip external rotation    Knee flexion 5 4+  Knee extension 5 5-  Ankle dorsiflexion 5 5  Ankle plantarflexion    Ankle inversion    Ankle eversion     (Blank rows = not tested)   FUNCTIONAL TESTS:  5 times sit to stand: 14.63 sec use of bilat UE's; 21 inch surface   GAIT: Distance walked: 40 Assistive device utilized: None Level of assistance: Complete Independence Comments: LE's in ER; fwd flexed posture; limp Lt LE in WB Lt; antalgic gait   [facility] Adult PT Treatment:                                                DATE: 05/22/23 Therapeutic Exercise: Nustep L6 x 6 min Supine  Hamstring stretch w/strap 30 sec x 3 Quad set towel behind knee 3 sec x 10  Mini SLR 3 sec x 10  Neural mobilization knee to ceiling x 10  Sitting  HS stretch 30 sec x 3  Standing  Gastro stretch 30 sec x 3  Soleus stretch 30 sec x  2 Manual Therapy: Skilled palpation to assess response to manual work and DN IASTM Lt HS and calf  Trigger Point Dry-Needling  Treatment instructions: Expect mild to moderate muscle soreness. S/S of pneumothorax if dry needled over a  lung field, and to seek immediate medical attention should they occur. Patient verbalized understanding of these instructions and education.  Patient Consent Given: Yes Education handout provided: Previously provided Muscles treated: Lt hamstring - medial and lateral; Lt gastroc Electrical stimulation performed: Yes Parameters: mAmp current intensity to pt tolerance  Treatment response/outcome: decreased palpable tightness   Gait: Ambulation in clinic post treatment with improved gait pattern with increased wt bearing Lt LE in stance phase; no pain  Self Care: Myofacial ball release hamstring and calf sitting Importance of exercises Added heel lift R LE to adjust to leg length difference   [facility] Adult PT Treatment:                                                DATE: 05/20/23 Therapeutic Exercise: Supine  Hamstring stretch w/strap 30 sec x 3 Quad set towel behind knee 3 sec x 10  Mini SLR 3 sec x 10  Neural mobilization knee to ceiling x 10  Sitting  HS stretch 30 sec x 3  Standing  Gastro stretch 30 sec x 3  Soleus stretch 30 sec x 2 Manual Therapy: Skilled palpation to assess response to manual work and DN IASTM Lt HS and calf  Trigger Point Dry-Needling  Treatment instructions: Expect mild to moderate muscle soreness. S/S of pneumothorax if dry needled over a lung field, and to seek immediate medical attention should they occur. Patient verbalized understanding of these instructions and education.  Patient Consent Given: Yes Education handout provided: Previously provided Muscles treated: Lt hamstring; Lt gastroc Electrical stimulation performed: Yes Parameters: mAmp current intensity to pt tolerance  Treatment response/outcome: decreased  palpable tightness   Gait: Ambulation in clinic post treatment with improved gait pattern with increased wt bearing Lt LE in stance phase; no pain  Self Care: Myofacial ball release hamstring and calf sitting Importance of exercises  PATIENT EDUCATION:  Education details: POC; HEP Person educated: Patient Education method: Explanation, Demonstration, Tactile cues, Verbal cues, and Handouts Education comprehension: verbalized understanding, returned demonstration, verbal cues required, tactile cues required, and needs further education  HOME EXERCISE PROGRAM: Access Code: [email or web address] URL: [email or web address] Date: 05/20/2023 Prepared by: Corlis Leak  Exercises - Hooklying Hamstring Stretch with Strap  - 2 x daily - 7 x weekly - 1 sets - 3 reps - 30 sec  hold - Supine Quad Set  - 2 x daily - 7 x weekly - 1 sets - 10 reps - 3 sec  hold - Small Range Straight Leg Raise  - 2 x daily - 7 x weekly - 1 sets - 10 reps - 5 sec  hold - Seated Hamstring Stretch  - 2 x daily - 7 x weekly - 1 sets - 3 reps - 30 sec  hold - Gastroc Stretch on Wall  - 2 x daily - 7 x weekly - 1 sets - 3 reps - 30 sec  hold - Soleus Stretch on Wall  - 2 x daily - 7 x weekly - 1 sets - 3 reps - 30 sec  hold - Supine Sciatic Nerve Glide  - 2 x daily - 7 x weekly - 1 sets - 8-10 reps - 1-2 sec  hold  Patient Education - Trigger Point Dry Needling  ASSESSMENT:  CLINICAL IMPRESSION: Patient reports continued improvement in the Lt LE pain. He has some soreness  and aching in the back of the knee but no pain in the lateral proximal calf. He feels best in the recliner with knee bent up. Donold has  continued tightness Lt hamstrings and gastroc/soleus complex. Reviewed exercises. Continued neural mobilization and continued with manual work and DN to Lt HS and calf with good decrease in muscular tightness noted. Trial of heel lift R shoe to accommodate for leg length difference. Note improved gait pattern following  treatment. Patient reports no pain in posterior thigh and calf following treatment. Progressing toward stated goals of therapy.   OBJECTIVE IMPAIRMENTS:  Lt knee pain with probable injury 04/29/23 following end range flexion strain on the recumbent bike in the gym. He noticed pain the following day. Pain is in the Lt posterior hamstring and calf with some irritation along the lateral leg/fibula area. Patient has limited ROM; decreased strength; muscular tightness to palpation; abnormal gait pattern; decreased functional and recreational activities. Pain is in the Lt posterior hamstring and calf with some irritation along the lateral leg/fibula area. Patient has limited ROM; decreased strength; muscular tightness to palpation; abnormal gait pattern; decreased functional and recreational activities Abnormal gait, decreased activity tolerance, decreased balance, decreased endurance, decreased mobility, decreased ROM, decreased strength, increased fascial restrictions, impaired flexibility, improper body mechanics, postural dysfunction, and pain.    GOALS: Goals reviewed with patient? Yes  SHORT TERM GOALS: Target date: 06/24/2023  Independent in initial HEP  Baseline: Goal status: INITIAL  2.  Increase Lt knee ROM to 110 flexion and -5 extension  Baseline:  Goal status: INITIAL  LONG TERM GOALS: Target date: 08/05/2023   Improved gait pattern with increased wt bearing Lt LE and more normal gait pattern  Baseline:  Goal status: INITIAL  2.  AROM Lt knee 112 flexion to 0 extension  Baseline:  Goal status: INITIAL  3.  4+/5 to 5/5 strength Lt LE  Baseline:  Goal status: INITIAL  4.  Decreased pain to no more than 2/10 for functional activities  Baseline:  Goal status: INITIAL  5.  Return to all functional and recreational activities including golf  Baseline:  Goal status: INITIAL  6.  Independent in HEP including aquatic program as indicated  Baseline:  Goal status:  INITIAL      7.  Improve functional limitation score to 60    Baseline: 47    Goal status: Initial   PLAN:  PT FREQUENCY: 2x/week  PT DURATION: 12 weeks  PLANNED INTERVENTIONS: Therapeutic exercises, Therapeutic activity, Neuromuscular re-education, Balance training, Gait training, Patient/Family education, Self Care, Joint mobilization, Stair training, Aquatic Therapy, Electrical stimulation, Cryotherapy, Moist heat, Taping, Ultrasound, Ionotophoresis 4mg /ml Dexamethasone, Manual therapy, and Re-evaluation  PLAN FOR NEXT SESSION: review and progress exercises including myofacial ball release work for hamstrings and calf; education re joint care and body mechanics; manual work including STM/IASTM through the Lt hamstrings and calf, DN, modalities as indicated    [employer], PT 05/22/2023, 8:49 AM

## 2023-05-22 NOTE — Telephone Encounter (Signed)
Received notification from [employer] regarding PENDING for RYBELSUS. Patient NEED TO SUBMIT INCOME VERIFICATION   I SPOKE TO PATIENT AND HE IS BRINGING INTO OFFICE TO BE SENT TO ME. [phone] ATTENTION Chauntay Paszkiewicz   PLEASE BE ADVISED

## 2023-05-23 NOTE — Telephone Encounter (Signed)
Faxed Social Engineer, production to [employer] @ [phone]

## 2023-05-23 NOTE — Telephone Encounter (Signed)
Pt dropped off document that was requested from him (SS form from 2023) Document put at front office tray under Pharmacist tray. (Tammy)

## 2023-05-26 NOTE — Telephone Encounter (Signed)
RE-Submitted application DUE TO INCOME VERIFICATION  for RYBELSUS to [employer] for patient assistance.   PLEASE BE ADVISED

## 2023-05-27 ENCOUNTER — Ambulatory Visit: Payer: Medicare Other

## 2023-05-27 DIAGNOSIS — M6281 Muscle weakness (generalized): Secondary | ICD-10-CM | POA: Diagnosis not present

## 2023-05-27 DIAGNOSIS — R29898 Other symptoms and signs involving the musculoskeletal system: Secondary | ICD-10-CM

## 2023-05-27 DIAGNOSIS — G8929 Other chronic pain: Secondary | ICD-10-CM | POA: Diagnosis not present

## 2023-05-27 DIAGNOSIS — M25562 Pain in left knee: Secondary | ICD-10-CM | POA: Diagnosis not present

## 2023-05-27 NOTE — Therapy (Signed)
OUTPATIENT PHYSICAL THERAPY LOWER EXTREMITY TREATMENT   Patient Name: KEE DEPA MRN: [id number] DOB:[birth year], 89 y.o., male Today's Date: 05/27/2023  END OF SESSION:  PT End of Session - 05/27/23 1019     Visit Number 5    Number of Visits 24    Date for PT Re-Evaluation 07/16/23    Authorization Type [employer] Medicare $20 co pay    Progress Note Due on Visit 10    PT Start Time 1020    PT Stop Time 1102    PT Time Calculation (min) 42 min    Activity Tolerance Patient tolerated treatment well    Behavior During Therapy WFL for tasks assessed/performed             Past Medical History:  Diagnosis Date   Anemia    Antral gastritis 2013   EGD    Atrial fibrillation (HCC)    CAD (coronary artery disease)    s/p CABG 1993 (L-LAD, S-RI, S-D1);   echo 1/10: EF 55%;    myoview 12/09: inf MI, no ischemia   Candida esophagitis (HCC) 2013   EGD    Cataracts, bilateral    Family history of malignant neoplasm of gastrointestinal tract    Folliculitis    Hiatal hernia    HTN (hypertension)    Hyperlipidemia    Irritable bladder    Myocardial infarction [facility])    Obesity    Osteoarthritis    Past Surgical History:  Procedure Laterality Date   BLEPHAROPLASTY Bilateral 11/16/2021   upper lid   CATARACT EXTRACTION Right 07/21/2018   CHOLECYSTECTOMY     laparoscopic '92   CORONARY ARTERY BYPASS GRAFT     graft '92: LIMA-LAD, SVG - D2,R1, D1   ENTEROSCOPY  08/22/2012   Procedure: ENTEROSCOPY;  Surgeon: Charna Elizabeth, MD;  Location: [facility];  Service: Endoscopy;  Laterality: N/A;   INTRAOCULAR LENS EXCHANGE Right 07/21/2018   KNEE SURGERY     MOLE REMOVAL  2001 and 2008   PAROTIDECTOMY Right 06/18/2021   PILONIDAL CYST EXCISION     Patient Active Problem List   Diagnosis Date Noted   CKD (chronic kidney disease) stage 3, GFR 30-59 ml/min (HCC) 07/01/2022   Right leg swelling 12/17/2021   Lymphadenopathy, inguinal, right 12/17/2021   Primary osteoarthritis of right  knee 07/10/2021   Lumbar spondylosis 04/16/2019   CHF (congestive heart failure) (HCC) 12/17/2016   PCP NOTES >>>>> 10/11/2015   Carpal tunnel syndrome 02/15/2014   Anemia 08/14/2012   Annual physical exam 01/08/2012   Atrial fibrillation (HCC) 03/29/2011   Venous (peripheral) insufficiency 09/04/2009   OBESITY 07/04/2009   DJD (degenerative joint disease) 07/04/2009   SLEEP DISORDER 07/04/2009   Dyspepsia 02/28/2009   DM II (diabetes mellitus, type II), controlled (HCC) 04/25/2008   Hyperlipidemia 06/25/2007   Essential hypertension 06/25/2007   CAD (coronary artery disease) of artery bypass graft 06/25/2007    PCP: Dr [name]  REFERRING PROVIDER: Dr [name]  REFERRING DIAG: Lt knee pain   THERAPY DIAG:  Chronic pain of left knee  Other symptoms and signs involving the musculoskeletal system  Muscle weakness (generalized)  Rationale for Evaluation and Treatment: Rehabilitation  ONSET DATE: 04/29/23  SUBJECTIVE:   SUBJECTIVE STATEMENT: Patient reports he has an ache behind knee and upper calf but no pain. Patient states he has been wearing R heel lift.   Eval: Patient reports that he noticed the onset of Lt knee pain the day after he bent his knee  [place] [facility] [employer] [facility]. He noticed on 04/30/23. Now pain in the Lt posterior knee through and some into the lateral leg area. Pain has gotten a little better since the 22nd.   PERTINENT HISTORY: Arthritis bilat knees; scope Rt knee with continued limited ROM/strength Rt LE; CAD; CABG; HTN PAIN:  Are you having pain? Yes: NPRS scale: 4/10 Pain location: posterior Lt knee distal HS to proximal calf; lateral leg at fibular area  Pain description: dull; ache Aggravating factors: walking; standing; squatting; bending over  Relieving factors: sitting in recliner with feet up; heat   PRECAUTIONS: None  WEIGHT BEARING RESTRICTIONS: No  FALLS:  Has patient fallen in last 6 months? No  OCCUPATION:  retired; [employer] architect; golf's ~ 3 times/wk; gym 3 days/week (weights; bicycle - now just working upper body); yard work; mowing    PATIENT GOALS: get rid of the knee pain   NEXT MD VISIT: 08/12/23  OBJECTIVE:   DIAGNOSTIC FINDINGS: arthritic changes bilat knees  PATIENT SURVEYS:  FOTO - 47; goal 60   MUSCLE LENGTH: Hamstrings: Right 55 deg; Left 60 deg Thomas test: Right tight deg; Left tight deg  POSTURE: rounded shoulders, forward head, and flexed trunk   PALPATION: Tightness to palpation Lt hamstring and gastroc/soleus complex   LOWER EXTREMITY ROM:  Active ROM Right eval Left eval  Hip flexion Tight  Tight   Hip extension Tight  Tight   Hip abduction    Hip adduction    Hip internal rotation Tight  Tight   Hip external rotation Tight  Tight   Knee flexion 95 104  Knee extension -11 -4  Ankle dorsiflexion    Ankle plantarflexion    Ankle inversion    Ankle eversion     (Blank rows = not tested)  LOWER EXTREMITY MMT:  MMT Right eval Left eval  Hip flexion 4+ 4  Hip extension 4 4  Hip abduction 4+ 4  Hip adduction    Hip internal rotation    Hip external rotation    Knee flexion 5 4+  Knee extension 5 5-  Ankle dorsiflexion 5 5  Ankle plantarflexion    Ankle inversion    Ankle eversion     (Blank rows = not tested)   FUNCTIONAL TESTS:  5 times sit to stand: 14.63 sec use of bilat UE's; 21 inch surface   GAIT: Distance walked: 40 Assistive device utilized: None Level of assistance: Complete Independence Comments: LE's in ER; fwd flexed posture; limp Lt LE in WB Lt; antalgic gait   [facility] Adult PT Treatment:                                                DATE: 05/27/2023 Therapeutic Exercise: NuStep L6 x 6 min Supine:  HS stretch w/strap 3x30"  Sciatic nerve glide x 8 SAQ (green bolster) 5#AW 10x5"  Small range SLR 10x3" Seated  HS stretch (foot propped on stool) 2x30" Myofascial self-massage with ball & roller Hip add ball squeeze  10x5" Standing: Gastroc & soleus stretches x30" each     [facility] Adult PT Treatment:  DATE: 05/22/23 Therapeutic Exercise: Nustep L6 x 6 min Supine  Hamstring stretch w/strap 30 sec x 3 Quad set towel behind knee 3 sec x 10  Mini SLR 3 sec x 10  Neural mobilization knee to ceiling x 10  Sitting  HS stretch 30 sec x 3  Standing  Gastro stretch 30 sec x 3  Soleus stretch 30 sec x 2 Manual Therapy: Skilled palpation to assess response to manual work and DN IASTM Lt HS and calf  Trigger Point Dry-Needling  Treatment instructions: Expect mild to moderate muscle soreness. S/S of pneumothorax if dry needled over a lung field, and to seek immediate medical attention should they occur. Patient verbalized understanding of these instructions and education.  Patient Consent Given: Yes Education handout provided: Previously provided Muscles treated: Lt hamstring - medial and lateral; Lt gastroc Electrical stimulation performed: Yes Parameters: mAmp current intensity to pt tolerance  Treatment response/outcome: decreased palpable tightness   Gait: Ambulation in clinic post treatment with improved gait pattern with increased wt bearing Lt LE in stance phase; no pain  Self Care: Myofacial ball release hamstring and calf sitting Importance of exercises Added heel lift R LE to adjust to leg length difference     [facility] Adult PT Treatment:                                                DATE: 05/20/23 Therapeutic Exercise: Supine  Hamstring stretch w/strap 30 sec x 3 Quad set towel behind knee 3 sec x 10  Mini SLR 3 sec x 10  Neural mobilization knee to ceiling x 10  Sitting  HS stretch 30 sec x 3  Standing  Gastro stretch 30 sec x 3  Soleus stretch 30 sec x 2 Manual Therapy: Skilled palpation to assess response to manual work and DN IASTM Lt HS and calf  Trigger Point Dry-Needling  Treatment instructions: Expect mild to moderate muscle  soreness. S/S of pneumothorax if dry needled over a lung field, and to seek immediate medical attention should they occur. Patient verbalized understanding of these instructions and education.  Patient Consent Given: Yes Education handout provided: Previously provided Muscles treated: Lt hamstring; Lt gastroc Electrical stimulation performed: Yes Parameters: mAmp current intensity to pt tolerance  Treatment response/outcome: decreased palpable tightness   Gait: Ambulation in clinic post treatment with improved gait pattern with increased wt bearing Lt LE in stance phase; no pain  Self Care: Myofacial ball release hamstring and calf sitting Importance of exercises   PATIENT EDUCATION:  Education details: POC; HEP Person educated: Patient Education method: Explanation, Demonstration, Tactile cues, Verbal cues, and Handouts Education comprehension: verbalized understanding, returned demonstration, verbal cues required, tactile cues required, and needs further education  HOME EXERCISE PROGRAM: Access Code: [email or web address] URL: [email or web address] Date: 05/20/2023 Prepared by: Corlis Leak  Exercises - Hooklying Hamstring Stretch with Strap  - 2 x daily - 7 x weekly - 1 sets - 3 reps - 30 sec  hold - Supine Quad Set  - 2 x daily - 7 x weekly - 1 sets - 10 reps - 3 sec  hold - Small Range Straight Leg Raise  - 2 x daily - 7 x weekly - 1 sets - 10 reps - 5 sec  hold - Seated Hamstring Stretch  - 2 x daily - 7 x weekly - 1 sets -  3 reps - 30 sec  hold - Gastroc Stretch on Wall  - 2 x daily - 7 x weekly - 1 sets - 3 reps - 30 sec  hold - Soleus Stretch on Wall  - 2 x daily - 7 x weekly - 1 sets - 3 reps - 30 sec  hold - Supine Sciatic Nerve Glide  - 2 x daily - 7 x weekly - 1 sets - 8-10 reps - 1-2 sec  hold  Patient Education - Trigger Point Dry Needling  ASSESSMENT:  CLINICAL IMPRESSION: Quad strengthening progressed and hip adduction strengthening added with exercises.  Occasional tactile cues provided to improve alignment and body mechanics during sicatic nerve glides.  OBJECTIVE IMPAIRMENTS:  Lt knee pain with probable injury 04/29/23 following end range flexion strain on the recumbent bike in the gym. He noticed pain the following day. Pain is in the Lt posterior hamstring and calf with some irritation along the lateral leg/fibula area. Patient has limited ROM; decreased strength; muscular tightness to palpation; abnormal gait pattern; decreased functional and recreational activities. Pain is in the Lt posterior hamstring and calf with some irritation along the lateral leg/fibula area. Patient has limited ROM; decreased strength; muscular tightness to palpation; abnormal gait pattern; decreased functional and recreational activities Abnormal gait, decreased activity tolerance, decreased balance, decreased endurance, decreased mobility, decreased ROM, decreased strength, increased fascial restrictions, impaired flexibility, improper body mechanics, postural dysfunction, and pain.    GOALS: Goals reviewed with patient? Yes  SHORT TERM GOALS: Target date: 06/24/2023  Independent in initial HEP  Baseline: Goal status: INITIAL  2.  Increase Lt knee ROM to 110 flexion and -5 extension  Baseline:  Goal status: INITIAL  LONG TERM GOALS: Target date: 08/05/2023  Improved gait pattern with increased wt bearing Lt LE and more normal gait pattern  Baseline:  Goal status: INITIAL  2.  AROM Lt knee 112 flexion to 0 extension  Baseline:  Goal status: INITIAL  3.  4+/5 to 5/5 strength Lt LE  Baseline:  Goal status: INITIAL  4.  Decreased pain to no more than 2/10 for functional activities  Baseline:  Goal status: INITIAL  5.  Return to all functional and recreational activities including golf  Baseline:  Goal status: INITIAL  6.  Independent in HEP including aquatic program as indicated  Baseline:  Goal status: INITIAL      7.  Improve functional  limitation score to 60    Baseline: 47    Goal status: Initial   PLAN:  PT FREQUENCY: 2x/week  PT DURATION: 12 weeks  PLANNED INTERVENTIONS: Therapeutic exercises, Therapeutic activity, Neuromuscular re-education, Balance training, Gait training, Patient/Family education, Self Care, Joint mobilization, Stair training, Aquatic Therapy, Electrical stimulation, Cryotherapy, Moist heat, Taping, Ultrasound, Ionotophoresis 4mg /ml Dexamethasone, Manual therapy, and Re-evaluation  PLAN FOR NEXT SESSION: Hamstring/lateral thigh stretching, quad/glute/core strengthening; manual work including STM/IASTM through the Lt hamstrings and calf, DN, modalities as indicated    Sanjuana Mae, PTA 05/27/2023, 11:04 AM

## 2023-05-29 ENCOUNTER — Telehealth: Payer: Self-pay | Admitting: Internal Medicine

## 2023-05-29 ENCOUNTER — Ambulatory Visit: Payer: Medicare Other

## 2023-05-29 ENCOUNTER — Encounter: Payer: Self-pay | Admitting: Pharmacist

## 2023-05-29 DIAGNOSIS — R29898 Other symptoms and signs involving the musculoskeletal system: Secondary | ICD-10-CM | POA: Diagnosis not present

## 2023-05-29 DIAGNOSIS — M6281 Muscle weakness (generalized): Secondary | ICD-10-CM | POA: Diagnosis not present

## 2023-05-29 DIAGNOSIS — G8929 Other chronic pain: Secondary | ICD-10-CM | POA: Diagnosis not present

## 2023-05-29 DIAGNOSIS — M25562 Pain in left knee: Secondary | ICD-10-CM | POA: Diagnosis not present

## 2023-05-29 NOTE — Therapy (Signed)
OUTPATIENT PHYSICAL THERAPY LOWER EXTREMITY TREATMENT   Patient Name: [name] MRN: [id number] DOB:[birth year], 89 y.o., male Today's Date: 05/29/2023  END OF SESSION:  PT End of Session - 05/29/23 1532     Visit Number 6    Number of Visits 24    Date for PT Re-Evaluation 07/16/23    Authorization Type [employer] Medicare $20 co pay    Progress Note Due on Visit 10    PT Start Time 1530    PT Stop Time 1610    PT Time Calculation (min) 40 min    Activity Tolerance Patient tolerated treatment well    Behavior During Therapy [facility] for tasks assessed/performed             Past Medical History:  Diagnosis Date   Anemia    Antral gastritis 2013   EGD    Atrial fibrillation (HCC)    CAD (coronary artery disease)    s/p CABG 1993 (L-LAD, S-RI, S-D1);   echo 1/10: EF 55%;    myoview 12/09: inf MI, no ischemia   Candida esophagitis (HCC) 2013   EGD    Cataracts, bilateral    Family history of malignant neoplasm of gastrointestinal tract    Folliculitis    Hiatal hernia    HTN (hypertension)    Hyperlipidemia    Irritable bladder    Myocardial infarction [facility]    Obesity    Osteoarthritis    Past Surgical History:  Procedure Laterality Date   BLEPHAROPLASTY Bilateral 11/16/2021   upper lid   CATARACT EXTRACTION Right 07/21/2018   CHOLECYSTECTOMY     laparoscopic '92   CORONARY ARTERY BYPASS GRAFT     graft '92: LIMA-LAD, SVG - D2,R1, D1   ENTEROSCOPY  08/22/2012   Procedure: ENTEROSCOPY;  Surgeon: Charna Elizabeth, MD;  Location: [facility];  Service: Endoscopy;  Laterality: N/A;   INTRAOCULAR LENS EXCHANGE Right 07/21/2018   KNEE SURGERY     MOLE REMOVAL  2001 and 2008   PAROTIDECTOMY Right 06/18/2021   PILONIDAL CYST EXCISION     Patient Active Problem List   Diagnosis Date Noted   CKD (chronic kidney disease) stage 3, GFR 30-59 ml/min (HCC) 07/01/2022   Right leg swelling 12/17/2021   Lymphadenopathy, inguinal, right 12/17/2021   Primary osteoarthritis of right  knee 07/10/2021   Lumbar spondylosis 04/16/2019   CHF (congestive heart failure) (HCC) 12/17/2016   PCP NOTES >>>>> 10/11/2015   Carpal tunnel syndrome 02/15/2014   Anemia 08/14/2012   Annual physical exam 01/08/2012   Atrial fibrillation (HCC) 03/29/2011   Venous (peripheral) insufficiency 09/04/2009   OBESITY 07/04/2009   DJD (degenerative joint disease) 07/04/2009   SLEEP DISORDER 07/04/2009   Dyspepsia 02/28/2009   DM II (diabetes mellitus, type II), controlled (HCC) 04/25/2008   Hyperlipidemia 06/25/2007   Essential hypertension 06/25/2007   CAD (coronary artery disease) of artery bypass graft 06/25/2007    PCP: Dr [name]  REFERRING PROVIDER: Dr [name]  REFERRING DIAG: Lt knee pain   THERAPY DIAG:  Chronic pain of left knee  Muscle weakness (generalized)  Other symptoms and signs involving the musculoskeletal system  Rationale for Evaluation and Treatment: Rehabilitation  ONSET DATE: 04/29/23  SUBJECTIVE:   SUBJECTIVE STATEMENT: Patient reports he continues to have achy pain in L hamstring.   Eval: Patient reports that he noticed the onset of Lt knee pain the day after he bent his [place] [facility] [employer] [facility].  He noticed on 04/30/23. Now pain in the Lt posterior knee through and some into the lateral leg area. Pain has gotten a little better since the 22nd.   PERTINENT HISTORY: Arthritis bilat knees; scope Rt knee with continued limited ROM/strength Rt LE; CAD; CABG; HTN PAIN:  Are you having pain? Yes: NPRS scale: 4/10 Pain location: posterior Lt knee distal HS to proximal calf; lateral leg at fibular area  Pain description: dull; ache Aggravating factors: walking; standing; squatting; bending over  Relieving factors: sitting in recliner with feet up; heat   PRECAUTIONS: None  WEIGHT BEARING RESTRICTIONS: No  FALLS:  Has patient fallen in last 6 months? No  OCCUPATION: retired; [employer] architect; golf's ~ 3 times/wk; gym 3  days/week (weights; bicycle - now just working upper body); yard work; mowing    PATIENT GOALS: get rid of the knee pain   NEXT MD VISIT: 08/12/23  OBJECTIVE:   DIAGNOSTIC FINDINGS: arthritic changes bilat knees  PATIENT SURVEYS:  FOTO - 47; goal 60   MUSCLE LENGTH: Hamstrings: Right 55 deg; Left 60 deg Thomas test: Right tight deg; Left tight deg  POSTURE: rounded shoulders, forward head, and flexed trunk   PALPATION: Tightness to palpation Lt hamstring and gastroc/soleus complex   LOWER EXTREMITY ROM:  Active ROM Right eval Left eval  Hip flexion Tight  Tight   Hip extension Tight  Tight   Hip abduction    Hip adduction    Hip internal rotation Tight  Tight   Hip external rotation Tight  Tight   Knee flexion 95 104  Knee extension -11 -4  Ankle dorsiflexion    Ankle plantarflexion    Ankle inversion    Ankle eversion     (Blank rows = not tested)  LOWER EXTREMITY MMT:  MMT Right eval Left eval  Hip flexion 4+ 4  Hip extension 4 4  Hip abduction 4+ 4  Hip adduction    Hip internal rotation    Hip external rotation    Knee flexion 5 4+  Knee extension 5 5-  Ankle dorsiflexion 5 5  Ankle plantarflexion    Ankle inversion    Ankle eversion     (Blank rows = not tested)   FUNCTIONAL TESTS:  5 times sit to stand: 14.63 sec use of bilat UE's; 21 inch surface   GAIT: Distance walked: 40 Assistive device utilized: None Level of assistance: Complete Independence Comments: LE's in ER; fwd flexed posture; limp Lt LE in WB Lt; antalgic gait   [facility] Adult PT Treatment:                                                DATE: 05/29/2023 Therapeutic Exercise: NuStep L6 x 5 min Seated: HS stretch w/ foot on stool 5x10" Supine: Sciatic nerve glide x10 SAQ (green bolster) 5#AW 15x5" Seated: LAQ 5#AW 15x5" Hip add ball squeeze 10x5" LAQ + ball squeeze b/w knees 2x10 B Manual Therapy: Skilled palpation to assess response to manual work and DN IASTM/STM L  distal HS and calf    [facility] Adult PT Treatment:  DATE: 05/27/2023 Therapeutic Exercise: NuStep L6 x 6 min Supine:  HS stretch w/strap 3x30"  Sciatic nerve glide x 8 SAQ (green bolster) 5#AW 10x5"  Small range SLR 10x3" Seated: HS stretch (foot propped on stool) 2x30" Myofascial self-massage with ball & roller Hip add ball squeeze 10x5" Standing: Gastroc & soleus stretches x30" each    [facility] Adult PT Treatment:                                                DATE: 05/22/23 Therapeutic Exercise: Nustep L6 x 6 min Supine  Hamstring stretch w/strap 30 sec x 3 Quad set towel behind knee 3 sec x 10  Mini SLR 3 sec x 10  Neural mobilization knee to ceiling x 10  Sitting  HS stretch 30 sec x 3  Standing  Gastro stretch 30 sec x 3  Soleus stretch 30 sec x 2 Manual Therapy: Skilled palpation to assess response to manual work and DN IASTM Lt HS and calf  Trigger Point Dry-Needling  Treatment instructions: Expect mild to moderate muscle soreness. S/S of pneumothorax if dry needled over a lung field, and to seek immediate medical attention should they occur. Patient verbalized understanding of these instructions and education.  Patient Consent Given: Yes Education handout provided: Previously provided Muscles treated: Lt hamstring - medial and lateral; Lt gastroc Electrical stimulation performed: Yes Parameters: mAmp current intensity to pt tolerance  Treatment response/outcome: decreased palpable tightness   Gait: Ambulation in clinic post treatment with improved gait pattern with increased wt bearing Lt LE in stance phase; no pain  Self Care: Myofacial ball release hamstring and calf sitting Importance of exercises Added heel lift R LE to adjust to leg length difference     PATIENT EDUCATION:  Education details: POC; HEP Person educated: Patient Education method: Programmer, multimedia, Demonstration, Tactile cues, Verbal cues, and  Handouts Education comprehension: verbalized understanding, returned demonstration, verbal cues required, tactile cues required, and needs further education  HOME EXERCISE PROGRAM: Access Code: [email or web address] URL: [email or web address] Date: 05/20/2023 Prepared by: Corlis Leak  Exercises - Hooklying Hamstring Stretch with Strap  - 2 x daily - 7 x weekly - 1 sets - 3 reps - 30 sec  hold - Supine Quad Set  - 2 x daily - 7 x weekly - 1 sets - 10 reps - 3 sec  hold - Small Range Straight Leg Raise  - 2 x daily - 7 x weekly - 1 sets - 10 reps - 5 sec  hold - Seated Hamstring Stretch  - 2 x daily - 7 x weekly - 1 sets - 3 reps - 30 sec  hold - Gastroc Stretch on Wall  - 2 x daily - 7 x weekly - 1 sets - 3 reps - 30 sec  hold - Soleus Stretch on Wall  - 2 x daily - 7 x weekly - 1 sets - 3 reps - 30 sec  hold - Supine Sciatic Nerve Glide  - 2 x daily - 7 x weekly - 1 sets - 8-10 reps - 1-2 sec  hold  Patient Education - Trigger Point Dry Needling  ASSESSMENT:  CLINICAL IMPRESSION: Adductor strengthening continued; mild fatigue noted on L side during hip add isometric add-on with quad strengthening exercises. IASTM performed to alleviate myofascial tightness and tension at distal hamstring on L; noted tenderness/tightness L>R.  OBJECTIVE IMPAIRMENTS:  Lt knee pain with probable injury 04/29/23 following end range flexion strain on the recumbent bike in the gym. He noticed pain the following day. Pain is in the Lt posterior hamstring and calf with some irritation along the lateral leg/fibula area. Patient has limited ROM; decreased strength; muscular tightness to palpation; abnormal gait pattern; decreased functional and recreational activities. Pain is in the Lt posterior hamstring and calf with some irritation along the lateral leg/fibula area. Patient has limited ROM; decreased strength; muscular tightness to palpation; abnormal gait pattern; decreased functional and recreational  activities Abnormal gait, decreased activity tolerance, decreased balance, decreased endurance, decreased mobility, decreased ROM, decreased strength, increased fascial restrictions, impaired flexibility, improper body mechanics, postural dysfunction, and pain.    GOALS: Goals reviewed with patient? Yes  SHORT TERM GOALS: Target date: 06/24/2023  Independent in initial HEP  Baseline: Goal status: INITIAL  2.  Increase Lt knee ROM to 110 flexion and -5 extension  Baseline:  Goal status: INITIAL  LONG TERM GOALS: Target date: 08/05/2023  Improved gait pattern with increased wt bearing Lt LE and more normal gait pattern  Baseline:  Goal status: INITIAL  2.  AROM Lt knee 112 flexion to 0 extension  Baseline:  Goal status: INITIAL  3.  4+/5 to 5/5 strength Lt LE  Baseline:  Goal status: INITIAL  4.  Decreased pain to no more than 2/10 for functional activities  Baseline:  Goal status: INITIAL  5.  Return to all functional and recreational activities including golf  Baseline:  Goal status: INITIAL  6.  Independent in HEP including aquatic program as indicated  Baseline:  Goal status: INITIAL      7.  Improve functional limitation score to 60    Baseline: 47    Goal status: Initial   PLAN:  PT FREQUENCY: 2x/week  PT DURATION: 12 weeks  PLANNED INTERVENTIONS: Therapeutic exercises, Therapeutic activity, Neuromuscular re-education, Balance training, Gait training, Patient/Family education, Self Care, Joint mobilization, Stair training, Aquatic Therapy, Electrical stimulation, Cryotherapy, Moist heat, Taping, Ultrasound, Ionotophoresis 4mg /ml Dexamethasone, Manual therapy, and Re-evaluation  PLAN FOR NEXT SESSION: FOTO next visit. Hamstring/lateral thigh stretching, quad/glute/core strengthening; manual work including STM/IASTM through the Lt hamstrings and calf, DN, modalities as indicated    Sanjuana Mae, PTA 05/29/2023, 4:14 PM

## 2023-05-29 NOTE — Telephone Encounter (Signed)
Pt stated he dropped off some forms for Tammy to review to help with getting rybellsus and was just checking on the status of that. Please advise.

## 2023-05-29 NOTE — Telephone Encounter (Signed)
See phone message from 05/15/2023

## 2023-06-02 ENCOUNTER — Other Ambulatory Visit: Payer: Self-pay | Admitting: Internal Medicine

## 2023-06-02 NOTE — Telephone Encounter (Signed)
Received notification from [employer] regarding approval for [employer]. Patient assistance approved from 05/28/2023 to 12/09/2023.  Phone: [phone]  Please be advised

## 2023-06-02 NOTE — Telephone Encounter (Signed)
LM on VM about [employer] approval.

## 2023-06-04 ENCOUNTER — Ambulatory Visit: Payer: Medicare Other

## 2023-06-04 DIAGNOSIS — G8929 Other chronic pain: Secondary | ICD-10-CM | POA: Diagnosis not present

## 2023-06-04 DIAGNOSIS — M25562 Pain in left knee: Secondary | ICD-10-CM | POA: Diagnosis not present

## 2023-06-04 DIAGNOSIS — R29898 Other symptoms and signs involving the musculoskeletal system: Secondary | ICD-10-CM

## 2023-06-04 DIAGNOSIS — M6281 Muscle weakness (generalized): Secondary | ICD-10-CM | POA: Diagnosis not present

## 2023-06-04 NOTE — Therapy (Signed)
OUTPATIENT PHYSICAL THERAPY LOWER EXTREMITY TREATMENT   Patient Name: [name] MRN: [id number] DOB:15-Jan-1931, 89 y.o., male Today's Date: 06/04/2023  END OF SESSION:  PT End of Session - 06/04/23 1533     Visit Number 7    Number of Visits 24    Date for PT Re-Evaluation 07/16/23    Authorization Type [employer] Medicare $20 co pay    Progress Note Due on Visit 10    PT Start Time 1535    PT Stop Time 1618    PT Time Calculation (min) 43 min    Activity Tolerance Patient tolerated treatment well    Behavior During Therapy [facility] for tasks assessed/performed             Past Medical History:  Diagnosis Date   Anemia    Antral gastritis 2013   EGD    Atrial fibrillation (HCC)    CAD (coronary artery disease)    s/p CABG 1993 (L-LAD, S-RI, S-D1);   echo 1/10: EF 55%;    myoview 12/09: inf MI, no ischemia   Candida esophagitis (HCC) 2013   EGD    Cataracts, bilateral    Family history of malignant neoplasm of gastrointestinal tract    Folliculitis    Hiatal hernia    HTN (hypertension)    Hyperlipidemia    Irritable bladder    Myocardial infarction [facility])    Obesity    Osteoarthritis    Past Surgical History:  Procedure Laterality Date   BLEPHAROPLASTY Bilateral 11/16/2021   upper lid   CATARACT EXTRACTION Right 07/21/2018   CHOLECYSTECTOMY     laparoscopic '92   CORONARY ARTERY BYPASS GRAFT     graft '92: LIMA-LAD, SVG - D2,R1, D1   ENTEROSCOPY  08/22/2012   Procedure: ENTEROSCOPY;  Surgeon: Charna Elizabeth, MD;  Location: [facility];  Service: Endoscopy;  Laterality: N/A;   INTRAOCULAR LENS EXCHANGE Right 07/21/2018   KNEE SURGERY     MOLE REMOVAL  2001 and 2008   PAROTIDECTOMY Right 06/18/2021   PILONIDAL CYST EXCISION     Patient Active Problem List   Diagnosis Date Noted   CKD (chronic kidney disease) stage 3, GFR 30-59 ml/min (HCC) 07/01/2022   Right leg swelling 12/17/2021   Lymphadenopathy, inguinal, right 12/17/2021   Primary osteoarthritis of right  knee 07/10/2021   Lumbar spondylosis 04/16/2019   CHF (congestive heart failure) (HCC) 12/17/2016   PCP NOTES >>>>> 10/11/2015   Carpal tunnel syndrome 02/15/2014   Anemia 08/14/2012   Annual physical exam 01/08/2012   Atrial fibrillation (HCC) 03/29/2011   Venous (peripheral) insufficiency 09/04/2009   OBESITY 07/04/2009   DJD (degenerative joint disease) 07/04/2009   SLEEP DISORDER 07/04/2009   Dyspepsia 02/28/2009   DM II (diabetes mellitus, type II), controlled (HCC) 04/25/2008   Hyperlipidemia 06/25/2007   Essential hypertension 06/25/2007   CAD (coronary artery disease) of artery bypass graft 06/25/2007    PCP: Dr [name]  REFERRING PROVIDER: Dr [name]  REFERRING DIAG: Lt knee pain   THERAPY DIAG:  Chronic pain of left knee  Muscle weakness (generalized)  Other symptoms and signs involving the musculoskeletal system  Rationale for Evaluation and Treatment: Rehabilitation  ONSET DATE: 04/29/23  SUBJECTIVE:   SUBJECTIVE STATEMENT: Patient reports he continues to have intermittent pain "flare-ups" with L hamstring. Patient states pain is worse when he is driving in the car and goes away when he is able to stand again.  Eval: Patient reports that he noticed the onset of  Lt knee pain the day after he [place] his [place] [facility] [facility] [employer] [place]. He noticed on 04/30/23. Now pain in the Lt posterior knee through and some into the lateral leg area. Pain has gotten a little better since the 22nd.   PERTINENT HISTORY: Arthritis bilat knees; scope Rt knee with continued limited ROM/strength Rt LE; CAD; CABG; HTN PAIN:  Are you having pain? Yes: NPRS scale: 4/10 Pain location: posterior Lt knee distal HS to proximal calf; lateral leg at fibular area  Pain description: dull; ache Aggravating factors: walking; standing; squatting; bending over  Relieving factors: sitting in recliner with feet up; heat   PRECAUTIONS: None  WEIGHT BEARING RESTRICTIONS:  No  FALLS:  Has patient fallen in last 6 months? No  OCCUPATION: retired; Naval architect; golf's ~ 3 times/wk; gym 3 days/week (weights; bicycle - now just working upper body); yard work; mowing    PATIENT GOALS: get rid of the knee pain   NEXT MD VISIT: 08/12/23  OBJECTIVE:   DIAGNOSTIC FINDINGS: arthritic changes bilat knees  PATIENT SURVEYS:  FOTO - 47; goal 60   MUSCLE LENGTH: Hamstrings: Right 55 deg; Left 60 deg Thomas test: Right tight deg; Left tight deg  POSTURE: rounded shoulders, forward head, and flexed trunk   PALPATION: Tightness to palpation Lt hamstring and gastroc/soleus complex   LOWER EXTREMITY ROM:  Active ROM Right eval Left eval  Hip flexion Tight  Tight   Hip extension Tight  Tight   Hip abduction    Hip adduction    Hip internal rotation Tight  Tight   Hip external rotation Tight  Tight   Knee flexion 95 104  Knee extension -11 -4  Ankle dorsiflexion    Ankle plantarflexion    Ankle inversion    Ankle eversion     (Blank rows = not tested)  LOWER EXTREMITY MMT:  MMT Right eval Left eval  Hip flexion 4+ 4  Hip extension 4 4  Hip abduction 4+ 4  Hip adduction    Hip internal rotation    Hip external rotation    Knee flexion 5 4+  Knee extension 5 5-  Ankle dorsiflexion 5 5  Ankle plantarflexion    Ankle inversion    Ankle eversion     (Blank rows = not tested)   FUNCTIONAL TESTS:  5 times sit to stand: 14.63 sec use of bilat UE's; 21 inch surface   GAIT: Distance walked: 40 Assistive device utilized: None Level of assistance: Complete Independence Comments: LE's in ER; fwd flexed posture; limp Lt LE in WB Lt; antalgic gait    [facility] Adult PT Treatment:                                                DATE: 06/04/2023 Therapeutic Exercise: NuStep L6 x 5 min Seated HS stretch 3x30" Gastroc & soleus stretch on 4" step 2x30" Seated knee extension 15x5"  Standing HS curls (L) x15 Bent over hip ext (L) x15 Resisted side  stepping GTB crossed at ankles (counter) Supine sciatic nerve glide x10 Manual Therapy: IASTM L HS     [facility] Adult PT Treatment:  DATE: 05/29/2023 Therapeutic Exercise: NuStep L6 x 5 min Seated: HS stretch w/ foot on stool 5x10" Supine: Sciatic nerve glide x10 SAQ (green bolster) 5#AW 15x5" Seated: LAQ 5#AW 15x5" Hip add ball squeeze 10x5" LAQ + ball squeeze b/w knees 2x10 B Manual Therapy: IASTM/STM L distal HS and calf     [facility] Adult PT Treatment:                                                DATE: 05/22/23 Therapeutic Exercise: Nustep L6 x 6 min Supine  Hamstring stretch w/strap 30 sec x 3 Quad set towel behind knee 3 sec x 10  Mini SLR 3 sec x 10  Neural mobilization knee to ceiling x 10  Sitting  HS stretch 30 sec x 3  Standing  Gastro stretch 30 sec x 3  Soleus stretch 30 sec x 2 Manual Therapy: Skilled palpation to assess response to manual work and DN IASTM Lt HS and calf  Trigger Point Dry-Needling  Treatment instructions: Expect mild to moderate muscle soreness. S/S of pneumothorax if dry needled over a lung field, and to seek immediate medical attention should they occur. Patient verbalized understanding of these instructions and education.  Patient Consent Given: Yes Education handout provided: Previously provided Muscles treated: Lt hamstring - medial and lateral; Lt gastroc Electrical stimulation performed: Yes Parameters: mAmp current intensity to pt tolerance  Treatment response/outcome: decreased palpable tightness  Gait: Ambulation in clinic post treatment with improved gait pattern with increased wt bearing Lt LE in stance phase; no pain  Self Care: Myofacial ball release hamstring and calf sitting Importance of exercises Added heel lift R LE to adjust to leg length difference     PATIENT EDUCATION:  Education details: POC; HEP Person educated: Patient Education method: Programmer, multimedia,  Demonstration, Tactile cues, Verbal cues, and Handouts Education comprehension: verbalized understanding, returned demonstration, verbal cues required, tactile cues required, and needs further education  HOME EXERCISE PROGRAM: Access Code: [email or web address] URL: [email or web address] Date: 05/20/2023 Prepared by: Corlis Leak  Exercises - Hooklying Hamstring Stretch with Strap  - 2 x daily - 7 x weekly - 1 sets - 3 reps - 30 sec  hold - Supine Quad Set  - 2 x daily - 7 x weekly - 1 sets - 10 reps - 3 sec  hold - Small Range Straight Leg Raise  - 2 x daily - 7 x weekly - 1 sets - 10 reps - 5 sec  hold - Seated Hamstring Stretch  - 2 x daily - 7 x weekly - 1 sets - 3 reps - 30 sec  hold - Gastroc Stretch on Wall  - 2 x daily - 7 x weekly - 1 sets - 3 reps - 30 sec  hold - Soleus Stretch on Wall  - 2 x daily - 7 x weekly - 1 sets - 3 reps - 30 sec  hold - Supine Sciatic Nerve Glide  - 2 x daily - 7 x weekly - 1 sets - 8-10 reps - 1-2 sec  hold  Patient Education - Trigger Point Dry Needling  ASSESSMENT:  CLINICAL IMPRESSION: Initial cramping in L hamstring with standing resisted curls; patient able to complete exercise with no resistance in pain-free full range. Hip strengthening progressed with resisted side stepping; occasional verbal cues provided to improve upright standing posture.  OBJECTIVE IMPAIRMENTS:  Lt knee pain with probable injury 04/29/23 following end range flexion strain on the recumbent bike in the gym. He noticed pain the following day. Pain is in the Lt posterior hamstring and calf with some irritation along the lateral leg/fibula area. Patient has limited ROM; decreased strength; muscular tightness to palpation; abnormal gait pattern; decreased functional and recreational activities. Pain is in the Lt posterior hamstring and calf with some irritation along the lateral leg/fibula area. Patient has limited ROM; decreased strength; muscular tightness to palpation; abnormal  gait pattern; decreased functional and recreational activities Abnormal gait, decreased activity tolerance, decreased balance, decreased endurance, decreased mobility, decreased ROM, decreased strength, increased fascial restrictions, impaired flexibility, improper body mechanics, postural dysfunction, and pain.    GOALS: Goals reviewed with patient? Yes  SHORT TERM GOALS: Target date: 06/24/2023  Independent in initial HEP  Baseline: Goal status: INITIAL  2.  Increase Lt knee ROM to 110 flexion and -5 extension  Baseline:  Goal status: INITIAL  LONG TERM GOALS: Target date: 08/05/2023  Improved gait pattern with increased wt bearing Lt LE and more normal gait pattern  Baseline:  Goal status: INITIAL  2.  AROM Lt knee 112 flexion to 0 extension  Baseline:  Goal status: INITIAL  3.  4+/5 to 5/5 strength Lt LE  Baseline:  Goal status: INITIAL  4.  Decreased pain to no more than 2/10 for functional activities  Baseline:  Goal status: INITIAL  5.  Return to all functional and recreational activities including golf  Baseline:  Goal status: INITIAL  6.  Independent in HEP including aquatic program as indicated  Baseline:  Goal status: INITIAL      7.  Improve functional limitation score to 60    Baseline: 47    Goal status: IN PROGRESS   PLAN:  PT FREQUENCY: 2x/week  PT DURATION: 12 weeks  PLANNED INTERVENTIONS: Therapeutic exercises, Therapeutic activity, Neuromuscular re-education, Balance training, Gait training, Patient/Family education, Self Care, Joint mobilization, Stair training, Aquatic Therapy, Electrical stimulation, Cryotherapy, Moist heat, Taping, Ultrasound, Ionotophoresis 4mg /ml Dexamethasone, Manual therapy, and Re-evaluation  PLAN FOR NEXT SESSION: DN next visit. Hamstring/lateral thigh stretching, quad/glute/core strengthening; manual work including STM/IASTM through the Lt hamstrings and calf, DN, modalities as indicated    Sanjuana Mae,  PTA 06/04/2023, 4:24 PM

## 2023-06-05 ENCOUNTER — Ambulatory Visit: Payer: Medicare Other | Admitting: Rehabilitative and Restorative Service Providers"

## 2023-06-05 ENCOUNTER — Encounter: Payer: Self-pay | Admitting: Rehabilitative and Restorative Service Providers"

## 2023-06-05 DIAGNOSIS — G8929 Other chronic pain: Secondary | ICD-10-CM | POA: Diagnosis not present

## 2023-06-05 DIAGNOSIS — M6281 Muscle weakness (generalized): Secondary | ICD-10-CM

## 2023-06-05 DIAGNOSIS — M25562 Pain in left knee: Secondary | ICD-10-CM | POA: Diagnosis not present

## 2023-06-05 DIAGNOSIS — R29898 Other symptoms and signs involving the musculoskeletal system: Secondary | ICD-10-CM | POA: Diagnosis not present

## 2023-06-05 NOTE — Therapy (Signed)
OUTPATIENT PHYSICAL THERAPY LOWER EXTREMITY TREATMENT   Patient Name: [name] MRN: [id number] DOB:1931-09-15, 89 y.o., male Today's Date: 06/05/2023  END OF SESSION:  PT End of Session - 06/05/23 0938     Visit Number 8    Number of Visits 24    Date for PT Re-Evaluation 07/16/23    Authorization Type [employer] Medicare $20 co pay    Progress Note Due on Visit 10    PT Start Time 0934    PT Stop Time 1022    PT Time Calculation (min) 48 min             Past Medical History:  Diagnosis Date   Anemia    Antral gastritis 2013   EGD    Atrial fibrillation (HCC)    CAD (coronary artery disease)    s/p CABG 1993 (L-LAD, S-RI, S-D1);   echo 1/10: EF 55%;    myoview 12/09: inf MI, no ischemia   Candida esophagitis (HCC) 2013   EGD    Cataracts, bilateral    Family history of malignant neoplasm of gastrointestinal tract    Folliculitis    Hiatal hernia    HTN (hypertension)    Hyperlipidemia    Irritable bladder    Myocardial infarction [facility])    Obesity    Osteoarthritis    Past Surgical History:  Procedure Laterality Date   BLEPHAROPLASTY Bilateral 11/16/2021   upper lid   CATARACT EXTRACTION Right 07/21/2018   CHOLECYSTECTOMY     laparoscopic '92   CORONARY ARTERY BYPASS GRAFT     graft '92: LIMA-LAD, SVG - D2,R1, D1   ENTEROSCOPY  08/22/2012   Procedure: ENTEROSCOPY;  Surgeon: Charna Elizabeth, MD;  Location: [facility];  Service: Endoscopy;  Laterality: N/A;   INTRAOCULAR LENS EXCHANGE Right 07/21/2018   KNEE SURGERY     MOLE REMOVAL  2001 and 2008   PAROTIDECTOMY Right 06/18/2021   PILONIDAL CYST EXCISION     Patient Active Problem List   Diagnosis Date Noted   CKD (chronic kidney disease) stage 3, GFR 30-59 ml/min (HCC) 07/01/2022   Right leg swelling 12/17/2021   Lymphadenopathy, inguinal, right 12/17/2021   Primary osteoarthritis of right knee 07/10/2021   Lumbar spondylosis 04/16/2019   CHF (congestive heart failure) (HCC) 12/17/2016   PCP NOTES >>>>>  10/11/2015   Carpal tunnel syndrome 02/15/2014   Anemia 08/14/2012   Annual physical exam 01/08/2012   Atrial fibrillation (HCC) 03/29/2011   Venous (peripheral) insufficiency 09/04/2009   OBESITY 07/04/2009   DJD (degenerative joint disease) 07/04/2009   SLEEP DISORDER 07/04/2009   Dyspepsia 02/28/2009   DM II (diabetes mellitus, type II), controlled (HCC) 04/25/2008   Hyperlipidemia 06/25/2007   Essential hypertension 06/25/2007   CAD (coronary artery disease) of artery bypass graft 06/25/2007    PCP: Dr [name]  REFERRING PROVIDER: Dr [name]  REFERRING DIAG: Lt knee pain   THERAPY DIAG:  Chronic pain of left knee  Muscle weakness (generalized)  Other symptoms and signs involving the musculoskeletal system  Rationale for Evaluation and Treatment: Rehabilitation  ONSET DATE: 04/29/23  SUBJECTIVE:   SUBJECTIVE STATEMENT: Patient reports he continues with some intermittent pain in the back of the L knee. He played golf Monday and did fine. Patient states pain is worse when he is driving in the car and goes away when he is able to stand again.  Eval: Patient reports that he noticed the onset of Lt knee pain the day after he bent his  k[place] [facility] [employer] [facility]. He noticed on 04/30/23. Now pain in the Lt posterior knee through and some into the lateral leg area. Pain has gotten a little better since the 22nd.   PERTINENT HISTORY: Arthritis bilat knees; scope Rt knee with continued limited ROM/strength Rt LE; CAD; CABG; HTN PAIN:  Are you having pain? Yes: NPRS scale: 3/10 Pain location: posterior Lt knee distal HS to proximal calf; lateral leg at fibular area  Pain description: dull; ache Aggravating factors: walking; standing; squatting; bending over  Relieving factors: sitting in recliner with feet up; heat   PRECAUTIONS: None  WEIGHT BEARING RESTRICTIONS: No  FALLS:  Has patient fallen in last 6 months? No  OCCUPATION: retired; Ecologist; golf's ~ 3 times/wk; gym 3 days/week (weights; bicycle - now just working upper body); yard work; mowing    PATIENT GOALS: get rid of the knee pain   NEXT MD VISIT: 08/12/23  OBJECTIVE:   DIAGNOSTIC FINDINGS: arthritic changes bilat knees  PATIENT SURVEYS:  FOTO - 47; goal 60   MUSCLE LENGTH: Hamstrings: Right 55 deg; Left 60 deg Thomas test: Right tight deg; Left tight deg  POSTURE: rounded shoulders, forward head, and flexed trunk   PALPATION: Tightness to palpation Lt hamstring and gastroc/soleus complex   LOWER EXTREMITY ROM:  Active ROM Right eval Left eval  Hip flexion Tight  Tight   Hip extension Tight  Tight   Hip abduction    Hip adduction    Hip internal rotation Tight  Tight   Hip external rotation Tight  Tight   Knee flexion 95 104  Knee extension -11 -4  Ankle dorsiflexion    Ankle plantarflexion    Ankle inversion    Ankle eversion     (Blank rows = not tested)  LOWER EXTREMITY MMT:  MMT Right eval Left eval  Hip flexion 4+ 4  Hip extension 4 4  Hip abduction 4+ 4  Hip adduction    Hip internal rotation    Hip external rotation    Knee flexion 5 4+  Knee extension 5 5-  Ankle dorsiflexion 5 5  Ankle plantarflexion    Ankle inversion    Ankle eversion     (Blank rows = not tested)   FUNCTIONAL TESTS:  5 times sit to stand: 14.63 sec use of bilat UE's; 21 inch surface   GAIT: Distance walked: 40 Assistive device utilized: None Level of assistance: Complete Independence Comments: LE's in ER; fwd flexed posture; limp Lt LE in WB Lt; antalgic gait    [facility] Adult PT Treatment:                                                DATE: 06/05/2023 Therapeutic Exercise: NuStep L6 x 5 min Seated HS stretch 3x30" Gastroc & soleus stretch at wall 3x30" Seated knee extension 15x5"  Sit to stand x 10  Standing hip abduction leading with heel 3 sec x 10 x 2 sets L/R Bent over hip ext at table 3 sec x 10 x 2 sets L/R Resisted side stepping  GTB crossed at ankles 10 ft x 5 R/L Hamstring stretch supine with strap 30 sec x 2  Supine sciatic nerve glide x10 Manual Therapy: IASTM L HS   [facility] Adult PT Treatment:  DATE: 06/04/2023 Therapeutic Exercise: NuStep L6 x 5 min Seated HS stretch 3x30" Gastroc & soleus stretch on 4" step 2x30" Seated knee extension 15x5"  Standing HS curls (L) x15 Bent over hip ext (L) x15 Resisted side stepping GTB crossed at ankles (counter) Supine sciatic nerve glide x10 Manual Therapy: IASTM L HS   [facility] Adult PT Treatment:    (dry needling)       DATE: 05/22/23 Therapeutic Exercise: Nustep L6 x 6 min Supine  Hamstring stretch w/strap 30 sec x 3 Quad set towel behind knee 3 sec x 10  Mini SLR 3 sec x 10  Neural mobilization knee to ceiling x 10  Sitting  HS stretch 30 sec x 3  Standing  Gastro stretch 30 sec x 3  Soleus stretch 30 sec x 2 Manual Therapy: Skilled palpation to assess response to manual work and DN IASTM Lt HS and calf  Trigger Point Dry-Needling  Treatment instructions: Expect mild to moderate muscle soreness. S/S of pneumothorax if dry needled over a lung field, and to seek immediate medical attention should they occur. Patient verbalized understanding of these instructions and education.  Patient Consent Given: Yes Education handout provided: Previously provided Muscles treated: Lt hamstring - medial and lateral; Lt gastroc Electrical stimulation performed: Yes Parameters: mAmp current intensity to pt tolerance  Treatment response/outcome: decreased palpable tightness  Gait: Ambulation in clinic post treatment with improved gait pattern with increased wt bearing Lt LE in stance phase; no pain  Self Care: Myofacial ball release hamstring and calf sitting Importance of exercises Added heel lift R LE to adjust to leg length difference     PATIENT EDUCATION:  Education details: POC; HEP Person educated: Patient Education  method: Programmer, multimedia, Demonstration, Tactile cues, Verbal cues, and Handouts Education comprehension: verbalized understanding, returned demonstration, verbal cues required, tactile cues required, and needs further education  HOME EXERCISE PROGRAM: Access Code: [email or web address] URL: [email or web address] Date: 05/20/2023 Prepared by: Corlis Leak  Exercises - Hooklying Hamstring Stretch with Strap  - 2 x daily - 7 x weekly - 1 sets - 3 reps - 30 sec  hold - Supine Quad Set  - 2 x daily - 7 x weekly - 1 sets - 10 reps - 3 sec  hold - Small Range Straight Leg Raise  - 2 x daily - 7 x weekly - 1 sets - 10 reps - 5 sec  hold - Seated Hamstring Stretch  - 2 x daily - 7 x weekly - 1 sets - 3 reps - 30 sec  hold - Gastroc Stretch on Wall  - 2 x daily - 7 x weekly - 1 sets - 3 reps - 30 sec  hold - Soleus Stretch on Wall  - 2 x daily - 7 x weekly - 1 sets - 3 reps - 30 sec  hold - Supine Sciatic Nerve Glide  - 2 x daily - 7 x weekly - 1 sets - 8-10 reps - 1-2 sec  hold  Patient Education - Trigger Point Dry Needling  ASSESSMENT:  CLINICAL IMPRESSION: [name] reports good improvement in L knee pain with decreased episodes of cramping and pain behind the knee. He played golf Monday with no problems He continues to work on stretching and strengthening. Positive response to manual work with decreased palpable tightness noted following IASTM. Progressing well toward stated goals of therapy.  OBJECTIVE IMPAIRMENTS:  Lt knee pain with probable injury 04/29/23 following end range flexion strain on the recumbent bike in the gym.  He noticed pain the following day. Pain is in the Lt posterior hamstring and calf with some irritation along the lateral leg/fibula area. Patient has limited ROM; decreased strength; muscular tightness to palpation; abnormal gait pattern; decreased functional and recreational activities. Pain is in the Lt posterior hamstring and calf with some irritation along the lateral leg/fibula  area. Patient has limited ROM; decreased strength; muscular tightness to palpation; abnormal gait pattern; decreased functional and recreational activities Abnormal gait, decreased activity tolerance, decreased balance, decreased endurance, decreased mobility, decreased ROM, decreased strength, increased fascial restrictions, impaired flexibility, improper body mechanics, postural dysfunction, and pain.    GOALS: Goals reviewed with patient? Yes  SHORT TERM GOALS: Target date: 06/24/2023  Independent in initial HEP  Baseline: Goal status: met  2.  Increase Lt knee ROM to 110 flexion and -5 extension  Baseline:  Goal status: met   LONG TERM GOALS: Target date: 08/05/2023  Improved gait pattern with increased wt bearing Lt LE and more normal gait pattern  Baseline:  Goal status: on going  2.  AROM Lt knee 112 flexion to 0 extension  Baseline:  Goal status: on going   3.  4+/5 to 5/5 strength Lt LE  Baseline:  Goal status: on going  4.  Decreased pain to no more than 2/10 for functional activities  Baseline:  Goal status: on going   5.  Return to all functional and recreational activities including golf  Baseline:  Goal status: INITIAL  6.  Independent in HEP including aquatic program as indicated  Baseline:  Goal status: partially met      7.  Improve functional limitation score to 60    Baseline: 47    Goal status: on going   PLAN:  PT FREQUENCY: 2x/week  PT DURATION: 12 weeks  PLANNED INTERVENTIONS: Therapeutic exercises, Therapeutic activity, Neuromuscular re-education, Balance training, Gait training, Patient/Family education, Self Care, Joint mobilization, Stair training, Aquatic Therapy, Electrical stimulation, Cryotherapy, Moist heat, Taping, Ultrasound, Ionotophoresis 4mg /ml Dexamethasone, Manual therapy, and Re-evaluation  PLAN FOR NEXT SESSION: DN next visit. Hamstring/lateral thigh stretching, quad/glute/core strengthening; manual work including STM/IASTM  through the Lt hamstrings and calf, DN, modalities as indicated    Cai Flott Rober Minion, PT 06/05/2023, 9:38 AM

## 2023-06-06 ENCOUNTER — Ambulatory Visit: Payer: Medicare Other

## 2023-06-09 ENCOUNTER — Ambulatory Visit: Payer: Medicare Other | Attending: Internal Medicine

## 2023-06-09 DIAGNOSIS — M6281 Muscle weakness (generalized): Secondary | ICD-10-CM | POA: Insufficient documentation

## 2023-06-09 DIAGNOSIS — R29898 Other symptoms and signs involving the musculoskeletal system: Secondary | ICD-10-CM | POA: Insufficient documentation

## 2023-06-09 DIAGNOSIS — G8929 Other chronic pain: Secondary | ICD-10-CM | POA: Insufficient documentation

## 2023-06-09 DIAGNOSIS — M25562 Pain in left knee: Secondary | ICD-10-CM | POA: Insufficient documentation

## 2023-06-09 NOTE — Therapy (Signed)
OUTPATIENT PHYSICAL THERAPY LOWER EXTREMITY TREATMENT   Patient Name: [name] MRN: [id number] DOB:1931/05/20, 89 y.o., male Today's Date: 06/09/2023  END OF SESSION:  PT End of Session - 06/09/23 1539     Visit Number 9    Number of Visits 24    Date for PT Re-Evaluation 07/16/23    Authorization Type [employer] Medicare $20 co pay    Progress Note Due on Visit 10    PT Start Time 1535    PT Stop Time 1615    PT Time Calculation (min) 40 min    Activity Tolerance Patient tolerated treatment well    Behavior During Therapy [facility] [place] for tasks assessed/performed             Past Medical History:  Diagnosis Date   Anemia    Antral gastritis 2013   EGD    Atrial fibrillation (HCC)    CAD (coronary artery disease)    s/p CABG 1993 (L-LAD, S-RI, S-D1);   echo 1/10: EF 55%;    myoview 12/09: inf MI, no ischemia   Candida esophagitis (HCC) 2013   EGD    Cataracts, bilateral    Family history of malignant neoplasm of gastrointestinal tract    Folliculitis    Hiatal hernia    HTN (hypertension)    Hyperlipidemia    Irritable bladder    Myocardial infarction [facility])    Obesity    Osteoarthritis    Past Surgical History:  Procedure Laterality Date   BLEPHAROPLASTY Bilateral 11/16/2021   upper lid   CATARACT EXTRACTION Right 07/21/2018   CHOLECYSTECTOMY     laparoscopic '92   CORONARY ARTERY BYPASS GRAFT     graft '92: LIMA-LAD, SVG - D2,R1, D1   ENTEROSCOPY  08/22/2012   Procedure: ENTEROSCOPY;  Surgeon: Charna Elizabeth, MD;  Location: [facility];  Service: Endoscopy;  Laterality: N/A;   INTRAOCULAR LENS EXCHANGE Right 07/21/2018   KNEE SURGERY     MOLE REMOVAL  2001 and 2008   PAROTIDECTOMY Right 06/18/2021   PILONIDAL CYST EXCISION     Patient Active Problem List   Diagnosis Date Noted   CKD (chronic kidney disease) stage 3, GFR 30-59 ml/min (HCC) 07/01/2022   Right leg swelling 12/17/2021   Lymphadenopathy, inguinal, right 12/17/2021   Primary osteoarthritis of right  knee 07/10/2021   Lumbar spondylosis 04/16/2019   CHF (congestive heart failure) (HCC) 12/17/2016   PCP NOTES >>>>> 10/11/2015   Carpal tunnel syndrome 02/15/2014   Anemia 08/14/2012   Annual physical exam 01/08/2012   Atrial fibrillation (HCC) 03/29/2011   Venous (peripheral) insufficiency 09/04/2009   OBESITY 07/04/2009   DJD (degenerative joint disease) 07/04/2009   SLEEP DISORDER 07/04/2009   Dyspepsia 02/28/2009   DM II (diabetes mellitus, type II), controlled (HCC) 04/25/2008   Hyperlipidemia 06/25/2007   Essential hypertension 06/25/2007   CAD (coronary artery disease) of artery bypass graft 06/25/2007    PCP: Dr [name]  REFERRING PROVIDER: Dr [name]  REFERRING DIAG: Lt knee pain   THERAPY DIAG:  Chronic pain of left knee  Muscle weakness (generalized)  Other symptoms and signs involving the musculoskeletal system  Rationale for Evaluation and Treatment: Rehabilitation  ONSET DATE: 04/29/23  SUBJECTIVE:   SUBJECTIVE STATEMENT: Patient reports he is able to tolerate approx 10 minutes of driving before pain ramps up at distal HS.  Eval: Patient reports that he noticed the onset of Lt knee pain the day after he [place] his knee [place] [facility] [facility] [employer] [facility]. He noticed on 04/30/23. Now pain in the Lt posterior knee through and some into the lateral leg area. Pain has gotten a little better since the 22nd.   PERTINENT HISTORY: Arthritis bilat knees; scope Rt knee with continued limited ROM/strength Rt LE; CAD; CABG; HTN PAIN:  Are you having pain? Yes: NPRS scale: 3/10 Pain location: posterior Lt knee distal HS to proximal calf; lateral leg at fibular area  Pain description: dull; ache Aggravating factors: walking; standing; squatting; bending over  Relieving factors: sitting in recliner with feet up; heat   PRECAUTIONS: None  WEIGHT BEARING RESTRICTIONS: No  FALLS:  Has patient fallen in last 6 months? No  OCCUPATION: retired; Ecologist; golf's ~ 3 times/wk; gym 3 days/week (weights; bicycle - now just working upper body); yard work; mowing    PATIENT GOALS: get rid of the knee pain   NEXT MD VISIT: 08/12/23  OBJECTIVE:   DIAGNOSTIC FINDINGS: arthritic changes bilat knees  PATIENT SURVEYS:  FOTO - 47; goal 60   MUSCLE LENGTH: Hamstrings: Right 55 deg; Left 60 deg Thomas test: Right tight deg; Left tight deg  POSTURE: rounded shoulders, forward head, and flexed trunk   PALPATION: Tightness to palpation Lt hamstring and gastroc/soleus complex   LOWER EXTREMITY ROM:  Active ROM Right eval Left eval  Hip flexion Tight  Tight   Hip extension Tight  Tight   Hip abduction    Hip adduction    Hip internal rotation Tight  Tight   Hip external rotation Tight  Tight   Knee flexion 95 104  Knee extension -11 -4  Ankle dorsiflexion    Ankle plantarflexion    Ankle inversion    Ankle eversion     (Blank rows = not tested)  LOWER EXTREMITY MMT:  MMT Right eval Left eval  Hip flexion 4+ 4  Hip extension 4 4  Hip abduction 4+ 4  Hip adduction    Hip internal rotation    Hip external rotation    Knee flexion 5 4+  Knee extension 5 5-  Ankle dorsiflexion 5 5  Ankle plantarflexion    Ankle inversion    Ankle eversion     (Blank rows = not tested)   FUNCTIONAL TESTS:  5 times sit to stand: 14.63 sec use of bilat UE's; 21 inch surface   GAIT: Distance walked: 40 Assistive device utilized: None Level of assistance: Complete Independence Comments: LE's in ER; fwd flexed posture; limp Lt LE in WB Lt; antalgic gait    [facility] Adult PT Treatment:                                                DATE: 06/09/2023 Therapeutic Exercise: NuStep L5 x Supine sciatic nerve glide x10 Supine ITB stretches w/strap 3x30" Small range SLR 10x5" Seated knee extension 15x5" + ball squeeze STS no HHA: airex --> pillow x15 Bent over hip ext x15 B Standing HS curls x10 B Side stepping GTB crossed at ankles  (counter) Manual Therapy: IASTM L HS    [facility] Adult PT Treatment:  DATE: 06/05/2023 Therapeutic Exercise: NuStep L6 x 5 min Seated HS stretch 3x30" Gastroc & soleus stretch at wall 3x30" Seated knee extension 15x5"  Sit to stand x 10  Standing hip abduction leading with heel 3 sec x 10 x 2 sets L/R Bent over hip ext at table 3 sec x 10 x 2 sets L/R Resisted side stepping GTB crossed at ankles 10 ft x 5 R/L Hamstring stretch supine with strap 30 sec x 2  Supine sciatic nerve glide x10 Manual Therapy: IASTM L HS    [facility] Adult PT Treatment:    (dry needling)       DATE: 05/22/23 Therapeutic Exercise: Nustep L6 x 6 min Supine  Hamstring stretch w/strap 30 sec x 3 Quad set towel behind knee 3 sec x 10  Mini SLR 3 sec x 10  Neural mobilization knee to ceiling x 10  Sitting  HS stretch 30 sec x 3  Standing  Gastro stretch 30 sec x 3  Soleus stretch 30 sec x 2 Manual Therapy: Skilled palpation to assess response to manual work and DN IASTM Lt HS and calf  Trigger Point Dry-Needling  Treatment instructions: Expect mild to moderate muscle soreness. S/S of pneumothorax if dry needled over a lung field, and to seek immediate medical attention should they occur. Patient verbalized understanding of these instructions and education.  Patient Consent Given: Yes Education handout provided: Previously provided Muscles treated: Lt hamstring - medial and lateral; Lt gastroc Electrical stimulation performed: Yes Parameters: mAmp current intensity to pt tolerance  Treatment response/outcome: decreased palpable tightness  Gait: Ambulation in clinic post treatment with improved gait pattern with increased wt bearing Lt LE in stance phase; no pain  Self Care: Myofacial ball release hamstring and calf sitting Importance of exercises Added heel lift R LE to adjust to leg length difference    PATIENT EDUCATION:  Education details: POC;  HEP Person educated: Patient Education method: Programmer, multimedia, Demonstration, Tactile cues, Verbal cues, and Handouts Education comprehension: verbalized understanding, returned demonstration, verbal cues required, tactile cues required, and needs further education  HOME EXERCISE PROGRAM: Access Code: [email or web address] URL: [email or web address] Date: 05/20/2023 Prepared by: Corlis Leak  Exercises - Hooklying Hamstring Stretch with Strap  - 2 x daily - 7 x weekly - 1 sets - 3 reps - 30 sec  hold - Supine Quad Set  - 2 x daily - 7 x weekly - 1 sets - 10 reps - 3 sec  hold - Small Range Straight Leg Raise  - 2 x daily - 7 x weekly - 1 sets - 10 reps - 5 sec  hold - Seated Hamstring Stretch  - 2 x daily - 7 x weekly - 1 sets - 3 reps - 30 sec  hold - Gastroc Stretch on Wall  - 2 x daily - 7 x weekly - 1 sets - 3 reps - 30 sec  hold - Soleus Stretch on Wall  - 2 x daily - 7 x weekly - 1 sets - 3 reps - 30 sec  hold - Supine Sciatic Nerve Glide  - 2 x daily - 7 x weekly - 1 sets - 8-10 reps - 1-2 sec  hold  Patient Education - Trigger Point Dry Needling  ASSESSMENT:  CLINICAL IMPRESSION: Occasional cueing provided to improve upright standing posture. Continued tightness noted along distal lateral hamstring on L LE; patient responds well to manual treatment with significant decrease in myofascial tightness.    OBJECTIVE IMPAIRMENTS:  Lt knee pain  with probable injury 04/29/23 following end range flexion strain on the recumbent bike in the gym. He noticed pain the following day. Pain is in the Lt posterior hamstring and calf with some irritation along the lateral leg/fibula area. Patient has limited ROM; decreased strength; muscular tightness to palpation; abnormal gait pattern; decreased functional and recreational activities. Pain is in the Lt posterior hamstring and calf with some irritation along the lateral leg/fibula area. Patient has limited ROM; decreased strength; muscular tightness to  palpation; abnormal gait pattern; decreased functional and recreational activities Abnormal gait, decreased activity tolerance, decreased balance, decreased endurance, decreased mobility, decreased ROM, decreased strength, increased fascial restrictions, impaired flexibility, improper body mechanics, postural dysfunction, and pain.    GOALS: Goals reviewed with patient? Yes  SHORT TERM GOALS: Target date: 06/24/2023  Independent in initial HEP  Baseline: Goal status: met  2.  Increase Lt knee ROM to 110 flexion and -5 extension  Baseline:  Goal status: met   LONG TERM GOALS: Target date: 08/05/2023  Improved gait pattern with increased wt bearing Lt LE and more normal gait pattern  Baseline:  Goal status: on going  2.  AROM Lt knee 112 flexion to 0 extension  Baseline:  Goal status: on going   3.  4+/5 to 5/5 strength Lt LE  Baseline:  Goal status: on going  4.  Decreased pain to no more than 2/10 for functional activities  Baseline:  Goal status: on going   5.  Return to all functional and recreational activities including golf  Baseline:  Goal status: INITIAL  6.  Independent in HEP including aquatic program as indicated  Baseline:  Goal status: partially met      7.  Improve functional limitation score to 60    Baseline: 47    Goal status: on going   PLAN:  PT FREQUENCY: 2x/week  PT DURATION: 12 weeks  PLANNED INTERVENTIONS: Therapeutic exercises, Therapeutic activity, Neuromuscular re-education, Balance training, Gait training, Patient/Family education, Self Care, Joint mobilization, Stair training, Aquatic Therapy, Electrical stimulation, Cryotherapy, Moist heat, Taping, Ultrasound, Ionotophoresis 4mg /ml Dexamethasone, Manual therapy, and Re-evaluation  PLAN FOR NEXT SESSION: Hamstring/lateral thigh stretching, quad/glute/core strengthening; manual work including STM/IASTM through the Lt hamstrings and calf, DN, modalities as indicated    Sanjuana Mae,  PTA 06/09/2023, 4:18 PM

## 2023-06-10 ENCOUNTER — Other Ambulatory Visit: Payer: Self-pay | Admitting: Internal Medicine

## 2023-06-10 DIAGNOSIS — I4821 Permanent atrial fibrillation: Secondary | ICD-10-CM

## 2023-06-11 ENCOUNTER — Ambulatory Visit: Payer: Medicare Other

## 2023-06-11 ENCOUNTER — Telehealth: Payer: Self-pay

## 2023-06-11 DIAGNOSIS — R29898 Other symptoms and signs involving the musculoskeletal system: Secondary | ICD-10-CM

## 2023-06-11 DIAGNOSIS — G8929 Other chronic pain: Secondary | ICD-10-CM

## 2023-06-11 DIAGNOSIS — M6281 Muscle weakness (generalized): Secondary | ICD-10-CM | POA: Diagnosis not present

## 2023-06-11 DIAGNOSIS — M25562 Pain in left knee: Secondary | ICD-10-CM | POA: Diagnosis not present

## 2023-06-11 NOTE — Telephone Encounter (Signed)
LMOM informing Pt that Pt assistance supply of Rybelsus 7mg  has arrived and placed at front desk for pick up at his convenience.

## 2023-06-11 NOTE — Therapy (Addendum)
OUTPATIENT PHYSICAL THERAPY LOWER EXTREMITY TREATMENT AND MEDICARE 10th VISIT NOTE   Progress Note Reporting Period 05/13/23 to 06/16/23  See note below for Objective Data and Assessment of Progress/Goals.      Patient Name: GALILEO SIECK MRN: [id number] DOB:May 04, 1931, 89 y.o., male Today's Date: 06/11/2023  END OF SESSION:  PT End of Session - 06/11/23 1358     Visit Number 10    Number of Visits 24    Date for PT Re-Evaluation 07/16/23    Authorization Type [employer] Medicare $20 co pay    Progress Note Due on Visit 10    PT Start Time 1400    PT Stop Time 1440    PT Time Calculation (min) 40 min    Activity Tolerance Patient tolerated treatment well    Behavior During Therapy [facility] for tasks assessed/performed             Past Medical History:  Diagnosis Date   Anemia    Antral gastritis 2013   EGD    Atrial fibrillation (HCC)    CAD (coronary artery disease)    s/p CABG 1993 (L-LAD, S-RI, S-D1);   echo 1/10: EF 55%;    myoview 12/09: inf MI, no ischemia   Candida esophagitis (HCC) 2013   EGD    Cataracts, bilateral    Family history of malignant neoplasm of gastrointestinal tract    Folliculitis    Hiatal hernia    HTN (hypertension)    Hyperlipidemia    Irritable bladder    Myocardial infarction [facility])    Obesity    Osteoarthritis    Past Surgical History:  Procedure Laterality Date   BLEPHAROPLASTY Bilateral 11/16/2021   upper lid   CATARACT EXTRACTION Right 07/21/2018   CHOLECYSTECTOMY     laparoscopic '92   CORONARY ARTERY BYPASS GRAFT     graft '92: LIMA-LAD, SVG - D2,R1, D1   ENTEROSCOPY  08/22/2012   Procedure: ENTEROSCOPY;  Surgeon: Charna Elizabeth, MD;  Location: [facility];  Service: Endoscopy;  Laterality: N/A;   INTRAOCULAR LENS EXCHANGE Right 07/21/2018   KNEE SURGERY     MOLE REMOVAL  2001 and 2008   PAROTIDECTOMY Right 06/18/2021   PILONIDAL CYST EXCISION     Patient Active Problem List   Diagnosis Date Noted   CKD (chronic kidney disease)  stage 3, GFR 30-59 ml/min (HCC) 07/01/2022   Right leg swelling 12/17/2021   Lymphadenopathy, inguinal, right 12/17/2021   Primary osteoarthritis of right knee 07/10/2021   Lumbar spondylosis 04/16/2019   CHF (congestive heart failure) (HCC) 12/17/2016   PCP NOTES >>>>> 10/11/2015   Carpal tunnel syndrome 02/15/2014   Anemia 08/14/2012   Annual physical exam 01/08/2012   Atrial fibrillation (HCC) 03/29/2011   Venous (peripheral) insufficiency 09/04/2009   OBESITY 07/04/2009   DJD (degenerative joint disease) 07/04/2009   SLEEP DISORDER 07/04/2009   Dyspepsia 02/28/2009   DM II (diabetes mellitus, type II), controlled (HCC) 04/25/2008   Hyperlipidemia 06/25/2007   Essential hypertension 06/25/2007   CAD (coronary artery disease) of artery bypass graft 06/25/2007    PCP: Dr [name]  REFERRING PROVIDER: Dr [name]  REFERRING DIAG: Lt knee pain   THERAPY DIAG:  Chronic pain of left knee  Muscle weakness (generalized)  Other symptoms and signs involving the musculoskeletal system  Rationale for Evaluation and Treatment: Rehabilitation  ONSET DATE: 04/29/23  SUBJECTIVE:   SUBJECTIVE STATEMENT: Patient reports he played a round of golf prior to therapy and felt fine throughout the  whole game; states when he was in the car driving to therapy the pain came back on that same spot on left distal HS.   Eval: Patient reports that he noticed the onset of Lt knee pain the day after he [place] his [place] [facility] [employer] [facility]. He noticed on 04/30/23. Now pain in the Lt posterior knee through and some into the lateral leg area. Pain has gotten a little better since the 22nd.   PERTINENT HISTORY: Arthritis bilat knees; scope Rt knee with continued limited ROM/strength Rt LE; CAD; CABG; HTN PAIN:  Are you having pain? Yes: NPRS scale: 3/10 Pain location: posterior Lt knee distal HS to proximal calf; lateral leg at fibular area  Pain description: dull;  ache Aggravating factors: walking; standing; squatting; bending over  Relieving factors: sitting in recliner with feet up; heat   PRECAUTIONS: None  WEIGHT BEARING RESTRICTIONS: No  FALLS:  Has patient fallen in last 6 months? No  OCCUPATION: retired; Naval architect; golf's ~ 3 times/wk; gym 3 days/week (weights; bicycle - now just working upper body); yard work; mowing    PATIENT GOALS: get rid of the knee pain   NEXT MD VISIT: 08/12/23  OBJECTIVE:   DIAGNOSTIC FINDINGS: arthritic changes bilat knees  PATIENT SURVEYS:  FOTO - 47; goal 60   MUSCLE LENGTH: Hamstrings: Right 55 deg; Left 60 deg Thomas test: Right tight deg; Left tight deg  POSTURE: rounded shoulders, forward head, and flexed trunk   PALPATION: Tightness to palpation Lt hamstring and gastroc/soleus complex   LOWER EXTREMITY ROM:  Active ROM Right eval Left eval Right 06/11/23 Left 06/11/23  Hip flexion Tight  Tight     Hip extension Tight  Tight     Hip abduction      Hip adduction      Hip internal rotation Tight  Tight     Hip external rotation Tight  Tight     Knee flexion 95 104 100 120  Knee extension -11 -4 -2 0  Ankle dorsiflexion      Ankle plantarflexion      Ankle inversion      Ankle eversion       (Blank rows = not tested)  LOWER EXTREMITY MMT:  MMT Right eval Left eval Right 06/11/23 Left 06/11/23  Hip flexion 4+ 4 4+ 4+  Hip extension 4 4 4+ 4+  Hip abduction 4+ 4 5 4+  Hip adduction      Hip internal rotation      Hip external rotation      Knee flexion 5 4+ 5 5  Knee extension 5 5- 5 5  Ankle dorsiflexion 5 5    Ankle plantarflexion      Ankle inversion      Ankle eversion       (Blank rows = not tested)   FUNCTIONAL TESTS:  5 times sit to stand: 14.63 sec use of bilat UE's; 21 inch surface   GAIT: Distance walked: 40 Assistive device utilized: None Level of assistance: Complete Independence Comments: LE's in ER; fwd flexed posture; limp Lt LE in WB Lt; antalgic  gait    [facility] Adult PT Treatment:                                                DATE: 06/11/2023  Therapeutic Exercise: Seated HS stretch with stool NuStep L6 x 5 min Hip/knee MMT Side stepping GTB crossed at ankles (counter)  Bent over hip ext YTB crossed at ankles 2x10 B Manual Therapy: IASTM L HS & gastroc    [facility] Adult PT Treatment:                                                DATE: 06/09/2023 Therapeutic Exercise: NuStep L5 x Supine sciatic nerve glide x10 Supine ITB stretches w/strap 3x30" Small range SLR 10x5" Seated knee extension 15x5" + ball squeeze STS no HHA: airex --> pillow x15 Bent over hip ext x15 B Standing HS curls x10 B Side stepping GTB crossed at ankles (counter) Manual Therapy: IASTM L HS   [facility] Adult PT Treatment:    (dry needling)       DATE: 05/22/23 Therapeutic Exercise: Nustep L6 x 6 min Supine  Hamstring stretch w/strap 30 sec x 3 Quad set towel behind knee 3 sec x 10  Mini SLR 3 sec x 10  Neural mobilization knee to ceiling x 10  Sitting  HS stretch 30 sec x 3  Standing  Gastro stretch 30 sec x 3  Soleus stretch 30 sec x 2 Manual Therapy: Skilled palpation to assess response to manual work and DN IASTM Lt HS and calf  Trigger Point Dry-Needling  Treatment instructions: Expect mild to moderate muscle soreness. S/S of pneumothorax if dry needled over a lung field, and to seek immediate medical attention should they occur. Patient verbalized understanding of these instructions and education.  Patient Consent Given: Yes Education handout provided: Previously provided Muscles treated: Lt hamstring - medial and lateral; Lt gastroc Electrical stimulation performed: Yes Parameters: mAmp current intensity to pt tolerance  Treatment response/outcome: decreased palpable tightness  Gait: Ambulation in clinic post treatment with improved gait pattern with increased wt bearing Lt LE in stance phase; no pain  Self Care: Myofacial ball release  hamstring and calf sitting Importance of exercises Added heel lift R LE to adjust to leg length difference    PATIENT EDUCATION:  Education details: POC; HEP Person educated: Patient Education method: Programmer, multimedia, Demonstration, Tactile cues, Verbal cues, and Handouts Education comprehension: verbalized understanding, returned demonstration, verbal cues required, tactile cues required, and needs further education  HOME EXERCISE PROGRAM: Access Code: [email or web address] URL: [email or web address] Date: 05/20/2023 Prepared by: Corlis Leak  Exercises - Hooklying Hamstring Stretch with Strap  - 2 x daily - 7 x weekly - 1 sets - 3 reps - 30 sec  hold - Supine Quad Set  - 2 x daily - 7 x weekly - 1 sets - 10 reps - 3 sec  hold - Small Range Straight Leg Raise  - 2 x daily - 7 x weekly - 1 sets - 10 reps - 5 sec  hold - Seated Hamstring Stretch  - 2 x daily - 7 x weekly - 1 sets - 3 reps - 30 sec  hold - Gastroc Stretch on Wall  - 2 x daily - 7 x weekly - 1 sets - 3 reps - 30 sec  hold - Soleus Stretch on Wall  - 2 x daily - 7 x weekly - 1 sets - 3 reps - 30 sec  hold - Supine Sciatic Nerve Glide  - 2 x daily - 7 x weekly - 1 sets -  8-10 reps - 1-2 sec  hold  Patient Education - Trigger Point Dry Needling  ASSESSMENT:  CLINICAL IMPRESSION: Good progression in hip strength exhibited as measured by MMT; most significant improvement seen with bilateral hip extension and abduction strength. Patient has returned to playing golf with no adverse effects during or after game, however continues to have increased pain on L distal HS primarily triggered while driving in car. Discussion with patient on placing towel underneath L thigh to alleviate pressure and pain while driving. Good progression noted with bilateral knee AROM measurements, with most significant increase noted with R knee extension and L knee flexion. Patient will continue to benefit from skilled therapy to progress LE strength and  knee AROM and address continuing subjective pain symptoms.   OBJECTIVE IMPAIRMENTS:  Lt knee pain with probable injury 04/29/23 following end range flexion strain on the recumbent bike in the gym. He noticed pain the following day. Pain is in the Lt posterior hamstring and calf with some irritation along the lateral leg/fibula area. Patient has limited ROM; decreased strength; muscular tightness to palpation; abnormal gait pattern; decreased functional and recreational activities. Pain is in the Lt posterior hamstring and calf with some irritation along the lateral leg/fibula area. Patient has limited ROM; decreased strength; muscular tightness to palpation; abnormal gait pattern; decreased functional and recreational activities Abnormal gait, decreased activity tolerance, decreased balance, decreased endurance, decreased mobility, decreased ROM, decreased strength, increased fascial restrictions, impaired flexibility, improper body mechanics, postural dysfunction, and pain.    GOALS: Goals reviewed with patient? Yes  SHORT TERM GOALS: Target date: 06/24/2023  Independent in initial HEP  Baseline: Goal status: met  2.  Increase Lt knee ROM to 110 flexion and -5 extension  Baseline:  Goal status: met   LONG TERM GOALS: Target date: 08/05/2023  Improved gait pattern with increased wt bearing Lt LE and more normal gait pattern  Baseline:  Goal status: IN PROGRESS  2.  AROM Lt knee 112 flexion to 0 extension  Baseline: see above Goal status: IN PROGRESS  3.  4+/5 to 5/5 strength Lt LE  Baseline:  Goal status: IN PROGRESS  4.  Decreased pain to no more than 2/10 for functional activities  Baseline: 2/10 Goal status: MET  5.  Return to all functional and recreational activities including golf  Baseline:  Goal status: MET  6.  Independent in HEP including aquatic program as indicated  Baseline:  Goal status: partially met      7.  Improve functional limitation score to  60    Baseline: 55    Goal status: IN PROGRESS   PLAN:  PT FREQUENCY: 2x/week  PT DURATION: 12 weeks  PLANNED INTERVENTIONS: Therapeutic exercises, Therapeutic activity, Neuromuscular re-education, Balance training, Gait training, Patient/Family education, Self Care, Joint mobilization, Stair training, Aquatic Therapy, Electrical stimulation, Cryotherapy, Moist heat, Taping, Ultrasound, Ionotophoresis 4mg /ml Dexamethasone, Manual therapy, and Re-evaluation  PLAN FOR NEXT SESSION: Hamstring/lateral thigh stretching, quad/glute/core strengthening; manual work including STM/IASTM through the Lt hamstrings and calf, DN, modalities as indicated    Sanjuana Mae, PTA 06/11/2023, 2:46 PM

## 2023-06-13 ENCOUNTER — Other Ambulatory Visit: Payer: Self-pay | Admitting: Internal Medicine

## 2023-06-16 ENCOUNTER — Ambulatory Visit: Payer: Medicare Other | Admitting: Pharmacist

## 2023-06-16 DIAGNOSIS — I10 Essential (primary) hypertension: Secondary | ICD-10-CM

## 2023-06-16 DIAGNOSIS — E1159 Type 2 diabetes mellitus with other circulatory complications: Secondary | ICD-10-CM

## 2023-06-16 DIAGNOSIS — E7800 Pure hypercholesterolemia, unspecified: Secondary | ICD-10-CM

## 2023-06-16 NOTE — Progress Notes (Signed)
06/16/2023 Name: [name] MRN: [id number] DOB: [birth year]  No chief complaint on file.   [name] is a 89 y.o. year old male who presented for a telephone visit.   They were referred to the pharmacist by their PCP for assistance in managing medication access.   Subjective:  Medication Access/Adherence  Current Pharmacy:  [facility] - [place], [place] - [address] [place] [address] Phone: [phone] Fax: [phone]  [id number] - [place], [place] - [address] AT [address] [facility] [address] [place] [address] Phone: [phone] Fax: [phone]   Patient reports affordability concerns with their medications: Yes  - has reached Medicare Coverage gap and cost of Eliquis and Rybelsus is high.  We were able to get Rybelsus from medication assistance program but not Eliquis (has not spent $600 out of pocket yet) Patient reports access/transportation concerns to their pharmacy: No  Patient reports adherence concerns with their medications:  No     Diabetes:  Current medications: Rybelsus 7mg  daily  Previously tried [place] but stopped due to balanitis and rash. No metformin due to CKD (last eGFR was 40)  Current glucose readings: hasn't been checking blood glucose recently  Patient denies hypoglycemic s/sx including no dizziness, shakiness, sweating. Patient denies hyperglycemic symptoms including no polyuria, polydipsia, polyphagia, nocturia, neuropathy, blurred vision.   Hypertension:  Current medications: metoprolol ER 25mg  - take 0.5 tablet daily and benazepril 20mg  daily   BP Readings from Last 3 Encounters:  05/06/23 132/62  01/06/23 (!) 157/70  12/11/22 120/76     Patient denies hypotensive s/sx including no dizziness, lightheadedness.  Patient denies hypertensive symptoms including no headache, chest pain, shortness of breath    Hyperlipidemia/ASCVD Risk  Reduction  Current lipid lowering medications: atorvastatin 40mg  daily    Atrial Fibrillation:  Current medications: Rate Control: metoprolol Rhythm Control:  Anticoagulation Regimen: Eliquis 2.5mg  twice a day  Age = 89 yo; weight = 95 kg; Scr = 1.51 Eliquis dose adjusted based on age > 41 and Scr > 1.5 (has been higher in past)    Objective:  Lab Results  Component Value Date   HGBA1C 6.7 (H) 05/06/2023    Lab Results  Component Value Date   CREATININE 1.51 (H) 05/06/2023   BUN 21 05/06/2023   NA 140 05/06/2023   K 4.7 05/06/2023   CL 104 05/06/2023   CO2 28 05/06/2023    Lab Results  Component Value Date   CHOL 127 12/11/2022   HDL 31.70 (L) 12/11/2022   LDLCALC 47 07/01/2022   LDLDIRECT 47.0 12/11/2022   TRIG 282.0 (H) 12/11/2022   CHOLHDL 4 12/11/2022    Medications Reviewed Today     Reviewed by Sanjuana Mae, PTA (Physical Therapy Assistant) on 06/11/23 at 1359  Med List Status: <None>   Medication Order Taking? Sig Documenting Provider Last Dose Status Informant  acetaminophen (TYLENOL) 650 MG CR tablet [id number] No Take 1 tablet (650 mg total) by mouth in the morning and at bedtime. Monica Becton, MD Taking Active   apixaban (ELIQUIS) 2.5 MG TABS tablet [id number] No TAKE 1 TABLET(2.5 MG) BY MOUTH TWICE DAILY Jens Som Madolyn Frieze, MD Taking Active   atorvastatin (LIPITOR) 40 MG tablet [id number] No TAKE 1 TABLET(40 MG) BY MOUTH DAILY Jens Som Madolyn Frieze, MD Taking Active   benazepril (LOTENSIN) 20 MG tablet [id number] No TAKE 1 TABLET(20 MG) BY MOUTH DAILY Wanda Plump, MD  Taking Active   clotrimazole-betamethasone (LOTRISONE) cream [id number] No Apply 1 Application topically 2 (two) times daily. Wanda Plump, MD Taking Active   Coenzyme Q10 (COQ10) 100 MG CAPS [id number] No Take 1 capsule by mouth daily.  [provider] Taking Active Self  furosemide (LASIX) 40 MG tablet [id number] No Take 1 tablet (40 mg total) by mouth daily. Wanda Plump, MD  Taking Active   Melatonin 10 MG TABS [id number] No Take 10 mg by mouth at bedtime.  [provider] Taking Active Self  metoprolol succinate (TOPROL-XL) 25 MG 24 hr tablet [id number]  Take 1 tablet (25 mg total) by mouth daily. Take with or immediately following a meal Paz, [name], MD  Active   Multiple Vitamin (MULTIVITAMIN WITH MINERALS) TABS tablet [id number] No Take 1 tablet by mouth daily. [provider] Taking Active Self  Semaglutide (RYBELSUS) 7 MG TABS [id number] No Take 1 tablet (7 mg total) by mouth daily. Wanda Plump, MD Taking Active   tamsulosin [facility]) 0.4 MG CAPS capsule [id number]  Take 1 capsule (0.4 mg total) by mouth daily after supper. Wanda Plump, MD  Active               Assessment/Plan:   Diabetes: A1c at goal of < 7.0%: - Recommend to continue Rybelsus 7mg  daily - reviewed proper administration - empty stomach and only take with small sip of water.  He just received medication assistance program delivery of 4 bottle of 30 tablets from [employer].  - Recommend to check glucose 2 to 3 times per week.  - Reviewed home blood glucose goals  Fasting blood glucose goal (before meals) = 80 to 130 Blood glucose goal after a meal = less than 180   - Meets financial criteria for Rybelsus patient assistance program through [employer] thru 12/09/2023  Hypertension: blood pressure at goal - Reviewed long term cardiovascular and renal outcomes of uncontrolled blood pressure - Recommend to continue metoprolol and benazepril  Hyperlipidemia/ASCVD Risk Reduction:LDL at goal of < 55 (CAD + Type 2 DM) - Reviewed long term complications of uncontrolled cholesterol - Recommend to atorvastatin 40mg  daily     Afib: controlled - continue current dose of Eliquis 2.5mg  twice a day and metoprolol 12.5mg  daily - Screened for medication assistance program at last visit but unfortunately patient did NOT meet financial criteria for Eliquis or for LIS.   Follow Up  Plan: 3 months to check in blood glucose and meds / reorder Rybelsus  Henrene Pastor, PharmD Clinical Pharmacist [facility]

## 2023-06-17 ENCOUNTER — Encounter: Payer: Self-pay | Admitting: Rehabilitative and Restorative Service Providers"

## 2023-06-17 ENCOUNTER — Ambulatory Visit: Payer: Medicare Other | Admitting: Rehabilitative and Restorative Service Providers"

## 2023-06-17 DIAGNOSIS — M6281 Muscle weakness (generalized): Secondary | ICD-10-CM

## 2023-06-17 DIAGNOSIS — G8929 Other chronic pain: Secondary | ICD-10-CM | POA: Diagnosis not present

## 2023-06-17 DIAGNOSIS — M25562 Pain in left knee: Secondary | ICD-10-CM | POA: Diagnosis not present

## 2023-06-17 DIAGNOSIS — R29898 Other symptoms and signs involving the musculoskeletal system: Secondary | ICD-10-CM | POA: Diagnosis not present

## 2023-06-17 NOTE — Therapy (Signed)
OUTPATIENT PHYSICAL THERAPY LOWER EXTREMITY TREATMENT AND MEDICARE 10th VISIT NOTE   Progress Note Reporting Period 05/13/23 to 06/16/23  See note below for Objective Data and Assessment of Progress/Goals.      Patient Name: [name] MRN: [id number] DOB:May 23, 1931, 89 y.o., male Today's Date: 06/17/2023  END OF SESSION:  PT End of Session - 06/17/23 0803     Visit Number 11    Number of Visits 24    Date for PT Re-Evaluation 07/16/23    Authorization Type [employer] Medicare $20 co pay    Progress Note Due on Visit 11    PT Start Time 0802    PT Stop Time 0845    PT Time Calculation (min) 43 min             Past Medical History:  Diagnosis Date   Anemia    Antral gastritis 2013   EGD    Atrial fibrillation (HCC)    CAD (coronary artery disease)    s/p CABG 1993 (L-LAD, S-RI, S-D1);   echo 1/10: EF 55%;    myoview 12/09: inf MI, no ischemia   Candida esophagitis (HCC) 2013   EGD    Cataracts, bilateral    Family history of malignant neoplasm of gastrointestinal tract    Folliculitis    Hiatal hernia    HTN (hypertension)    Hyperlipidemia    Irritable bladder    Myocardial infarction [facility])    Obesity    Osteoarthritis    Past Surgical History:  Procedure Laterality Date   BLEPHAROPLASTY Bilateral 11/16/2021   upper lid   CATARACT EXTRACTION Right 07/21/2018   CHOLECYSTECTOMY     laparoscopic '92   CORONARY ARTERY BYPASS GRAFT     graft '92: LIMA-LAD, SVG - D2,R1, D1   ENTEROSCOPY  08/22/2012   Procedure: ENTEROSCOPY;  Surgeon: Charna Elizabeth, MD;  Location: [facility];  Service: Endoscopy;  Laterality: N/A;   INTRAOCULAR LENS EXCHANGE Right 07/21/2018   KNEE SURGERY     MOLE REMOVAL  2001 and 2008   PAROTIDECTOMY Right 06/18/2021   PILONIDAL CYST EXCISION     Patient Active Problem List   Diagnosis Date Noted   CKD (chronic kidney disease) stage 3, GFR 30-59 ml/min (HCC) 07/01/2022   Right leg swelling 12/17/2021   Lymphadenopathy, inguinal, right  12/17/2021   Primary osteoarthritis of right knee 07/10/2021   Lumbar spondylosis 04/16/2019   CHF (congestive heart failure) (HCC) 12/17/2016   PCP NOTES >>>>> 10/11/2015   Carpal tunnel syndrome 02/15/2014   Anemia 08/14/2012   Annual physical exam 01/08/2012   Atrial fibrillation (HCC) 03/29/2011   Venous (peripheral) insufficiency 09/04/2009   OBESITY 07/04/2009   DJD (degenerative joint disease) 07/04/2009   SLEEP DISORDER 07/04/2009   Dyspepsia 02/28/2009   DM II (diabetes mellitus, type II), controlled (HCC) 04/25/2008   Hyperlipidemia 06/25/2007   Essential hypertension 06/25/2007   CAD (coronary artery disease) of artery bypass graft 06/25/2007    PCP: Dr [name]  REFERRING PROVIDER: Dr [name]  REFERRING DIAG: Lt knee pain   THERAPY DIAG:  Chronic pain of left knee  Muscle weakness (generalized)  Other symptoms and signs involving the musculoskeletal system  Rationale for Evaluation and Treatment: Rehabilitation  ONSET DATE: 04/29/23  SUBJECTIVE:   SUBJECTIVE STATEMENT: Patient reports that he has no pain in the L LE. He traded cars Saturday and thinks that the seat in his new car is much better and does not irritate his leg. He played  golf yesterday and had no pain.    Eval: Patient reports that he noticed the onset of Lt knee pain the day after he [place] his knee [place] [employer] [place]. He noticed on 04/30/23. Now pain in the Lt posterior knee through and some into the lateral leg area. Pain has gotten a little better since the 22nd.   PERTINENT HISTORY: Arthritis bilat knees; scope Rt knee with continued limited ROM/strength Rt LE; CAD; CABG; HTN PAIN:  Are you having pain? Yes: NPRS scale: 0/10 Pain location: posterior Lt knee distal HS to proximal calf; lateral leg at fibular area  Pain description: dull; ache Aggravating factors: walking; standing; squatting; bending over  Relieving factors: sitting in recliner with feet up;  heat   PRECAUTIONS: None  WEIGHT BEARING RESTRICTIONS: No  FALLS:  Has patient fallen in last 6 months? No  OCCUPATION: retired; Naval architect; golf's ~ 3 times/wk; gym 3 days/week (weights; bicycle - now just working upper body); yard work; mowing    PATIENT GOALS: get rid of the knee pain   NEXT MD VISIT: 08/12/23  OBJECTIVE:   DIAGNOSTIC FINDINGS: arthritic changes bilat knees  PATIENT SURVEYS:  FOTO - 47; goal 60 06/17/23: 73   MUSCLE LENGTH: Hamstrings: Right 55 deg; Left 60 deg Thomas test: Right tight deg; Left tight deg  POSTURE: rounded shoulders, forward head, and flexed trunk   PALPATION: Tightness to palpation Lt hamstring and gastroc/soleus complex    06/17/23: tightness medial hamstring  LOWER EXTREMITY ROM:  Active ROM Right eval Left eval Right 06/11/23 Left 06/11/23  Hip flexion Tight  Tight     Hip extension Tight  Tight     Hip abduction      Hip adduction      Hip internal rotation Tight  Tight     Hip external rotation Tight  Tight     Knee flexion 95 104 100 120  Knee extension -11 -4 -2 0  Ankle dorsiflexion      Ankle plantarflexion      Ankle inversion      Ankle eversion       (Blank rows = not tested)  LOWER EXTREMITY MMT:  MMT Right eval Left eval Right 06/11/23 Left 06/11/23 Left  06/17/23  Hip flexion 4+ 4 4+ 4+   Hip extension 4 4 4+ 4+   Hip abduction 4+ 4 5 4+   Hip adduction       Hip internal rotation       Hip external rotation       Knee flexion 5 4+ 5 5   Knee extension 5 5- 5 5   Ankle dorsiflexion 5 5     Ankle plantarflexion       Ankle inversion       Ankle eversion        (Blank rows = not tested)   FUNCTIONAL TESTS:  5 times sit to stand: 14.63 sec use of bilat UE's; 21 inch surface    06/17/23: 12.48 hands on knees x 2 then no hands   GAIT: Distance walked: 40 Assistive device utilized: None Level of assistance: Complete Independence Comments: LE's in ER; fwd flexed posture; limp Lt LE in WB Lt; antalgic  gait  06/17/23: improved gait with no pain L; continues to have limp R LE due to long standing knee pain   [facility] Adult PT Treatment:  DATE: 06/17/2023 Therapeutic Exercise: NuStep L6 x 5 min Seated HS stretch 30 sec x 3 Standing hip extension x 10 x 2 R/L Side stepping GTB at ankles at wall 10 ft x 6 R/L  Side lying hips/knees flexed for hip abduction lifting knee 10 x 2; knees together lifting foot 10 x 2  Bent over hip ext YTB crossed at ankles 2x10 B Manual Therapy: IASTM L HS & gastroc   [facility] Adult PT Treatment:                                                DATE: 06/11/2023 Therapeutic Exercise: Seated HS stretch with stool NuStep L6 x 5 min Hip/knee MMT Side stepping GTB crossed at ankles (counter)  Bent over hip ext YTB crossed at ankles 2x10 B Manual Therapy: IASTM L HS & gastroc   [facility] Adult PT Treatment:    (dry needling)       DATE: 05/22/23 Therapeutic Exercise: Nustep L6 x 6 min Supine  Hamstring stretch w/strap 30 sec x 3 Quad set towel behind knee 3 sec x 10  Mini SLR 3 sec x 10  Neural mobilization knee to ceiling x 10  Sitting  HS stretch 30 sec x 3  Standing  Gastro stretch 30 sec x 3  Soleus stretch 30 sec x 2 Manual Therapy: Skilled palpation to assess response to manual work and DN IASTM Lt HS and calf  Trigger Point Dry-Needling  Treatment instructions: Expect mild to moderate muscle soreness. S/S of pneumothorax if dry needled over a lung field, and to seek immediate medical attention should they occur. Patient verbalized understanding of these instructions and education.  Patient Consent Given: Yes Education handout provided: Previously provided Muscles treated: Lt hamstring - medial and lateral; Lt gastroc Electrical stimulation performed: Yes Parameters: mAmp current intensity to pt tolerance  Treatment response/outcome: decreased palpable tightness  Gait: Ambulation in clinic post treatment with  improved gait pattern with increased wt bearing Lt LE in stance phase; no pain  Self Care: Myofacial ball release hamstring and calf sitting Importance of exercises Added heel lift R LE to adjust to leg length difference    PATIENT EDUCATION:  Education details: POC; HEP Person educated: Patient Education method: Programmer, multimedia, Demonstration, Tactile cues, Verbal cues, and Handouts Education comprehension: verbalized understanding, returned demonstration, verbal cues required, tactile cues required, and needs further education  HOME EXERCISE PROGRAM: Access Code: [email or web address] URL: [email or web address] Date: 05/20/2023 Prepared by: Corlis Leak  Exercises - Hooklying Hamstring Stretch with Strap  - 2 x daily - 7 x weekly - 1 sets - 3 reps - 30 sec  hold - Supine Quad Set  - 2 x daily - 7 x weekly - 1 sets - 10 reps - 3 sec  hold - Small Range Straight Leg Raise  - 2 x daily - 7 x weekly - 1 sets - 10 reps - 5 sec  hold - Seated Hamstring Stretch  - 2 x daily - 7 x weekly - 1 sets - 3 reps - 30 sec  hold - Gastroc Stretch on Wall  - 2 x daily - 7 x weekly - 1 sets - 3 reps - 30 sec  hold - Soleus Stretch on Wall  - 2 x daily - 7 x weekly - 1 sets - 3 reps - 30 sec  hold -  Supine Sciatic Nerve Glide  - 2 x daily - 7 x weekly - 1 sets - 8-10 reps - 1-2 sec  hold  Patient Education - Trigger Point Dry Needling  ASSESSMENT:  CLINICAL IMPRESSION: Progressing well with L knee rehab. He has been been pain free for the past 5 days. Patient demonstrates increased strength L LE.  He has good gains in bilateral hip extension and abduction strength. Patient has returned to playing golf with no adverse effects during or after game. Good progression noted with bilateral knee AROM measurements, with most significant increase noted with R knee extension and L knee flexion. Patient will continue come for one additional appointment to review HEP and make sure he has no questions or problems with  the exercises.     OBJECTIVE IMPAIRMENTS:  Lt knee pain with probable injury 04/29/23 following end range flexion strain on the recumbent bike in the gym. He noticed pain the following day. Pain is in the Lt posterior hamstring and calf with some irritation along the lateral leg/fibula area. Patient has limited ROM; decreased strength; muscular tightness to palpation; abnormal gait pattern; decreased functional and recreational activities. Pain is in the Lt posterior hamstring and calf with some irritation along the lateral leg/fibula area. Patient has limited ROM; decreased strength; muscular tightness to palpation; abnormal gait pattern; decreased functional and recreational activities Abnormal gait, decreased activity tolerance, decreased balance, decreased endurance, decreased mobility, decreased ROM, decreased strength, increased fascial restrictions, impaired flexibility, improper body mechanics, postural dysfunction, and pain.    GOALS: Goals reviewed with patient? Yes  SHORT TERM GOALS: Target date: 06/24/2023  Independent in initial HEP  Baseline: Goal status: met  2.  Increase Lt knee ROM to 110 flexion and -5 extension  Baseline:  Goal status: met   LONG TERM GOALS: Target date: 08/05/2023  Improved gait pattern with increased wt bearing Lt LE and more normal gait pattern  Baseline:  Goal status: met  2.  AROM Lt knee 112 flexion to 0 extension  Baseline: see above Goal status: met  3.  4+/5 to 5/5 strength Lt LE  Baseline:  Goal status: met  4.  Decreased pain to no more than 2/10 for functional activities  Baseline: 2/10 Goal status: MET  5.  Return to all functional and recreational activities including golf  Baseline:  Goal status: MET  6.  Independent in HEP including aquatic program as indicated  Baseline:  Goal status: partially met      7.  Improve functional limitation score to 60    Baseline: 55    Goal status: met   PLAN:  PT FREQUENCY:  2x/week  PT DURATION: 12 weeks  PLANNED INTERVENTIONS: Therapeutic exercises, Therapeutic activity, Neuromuscular re-education, Balance training, Gait training, Patient/Family education, Self Care, Joint mobilization, Stair training, Aquatic Therapy, Electrical stimulation, Cryotherapy, Moist heat, Taping, Ultrasound, Ionotophoresis 4mg /ml Dexamethasone, Manual therapy, and Re-evaluation  PLAN FOR NEXT SESSION: Hamstring/lateral thigh stretching, quad/glute/core strengthening; manual work including STM/IASTM through the Lt hamstrings and calf, DN, modalities as indicated    [employer], PT 06/17/2023, 8:05 AM

## 2023-06-19 ENCOUNTER — Ambulatory Visit: Payer: Medicare Other | Admitting: Rehabilitative and Restorative Service Providers"

## 2023-06-19 ENCOUNTER — Encounter: Payer: Self-pay | Admitting: Rehabilitative and Restorative Service Providers"

## 2023-06-19 DIAGNOSIS — G8929 Other chronic pain: Secondary | ICD-10-CM | POA: Diagnosis not present

## 2023-06-19 DIAGNOSIS — M25562 Pain in left knee: Secondary | ICD-10-CM | POA: Diagnosis not present

## 2023-06-19 DIAGNOSIS — M6281 Muscle weakness (generalized): Secondary | ICD-10-CM

## 2023-06-19 DIAGNOSIS — R29898 Other symptoms and signs involving the musculoskeletal system: Secondary | ICD-10-CM

## 2023-06-19 NOTE — Therapy (Signed)
OUTPATIENT PHYSICAL THERAPY LOWER EXTREMITY TREATMENT AND  DISCHARGE SUMMARY      PHYSICAL THERAPY DISCHARGE SUMMARY  Visits from Start of Care: 12  Current functional level related to goals / functional outcomes: See progress report for discharge status   Remaining deficits: No deficits - needs to continue with HEP    Education / Equipment: HEP    Patient agrees to discharge. Patient goals were met. Patient is being discharged due to meeting the stated rehab goals.  Rui Wordell P. Leonor Liv PT, MPH 06/19/23 2:00 PM    Patient Name: JERAD DUNLAP MRN: [id number] DOB:02/09/1931, 89 y.o., male Today's Date: 06/19/2023  END OF SESSION:  PT End of Session - 06/19/23 1313     Visit Number 12    Number of Visits 24    Date for PT Re-Evaluation 07/16/23    Authorization Type [employer] Medicare $20 co pay    Progress Note Due on Visit 12    PT Start Time 1312    PT Stop Time 1356    PT Time Calculation (min) 44 min    Activity Tolerance Patient tolerated treatment well             Past Medical History:  Diagnosis Date   Anemia    Antral gastritis 2013   EGD    Atrial fibrillation (HCC)    CAD (coronary artery disease)    s/p CABG 1993 (L-LAD, S-RI, S-D1);   echo 1/10: EF 55%;    myoview 12/09: inf MI, no ischemia   Candida esophagitis (HCC) 2013   EGD    Cataracts, bilateral    Family history of malignant neoplasm of gastrointestinal tract    Folliculitis    Hiatal hernia    HTN (hypertension)    Hyperlipidemia    Irritable bladder    Myocardial infarction [facility])    Obesity    Osteoarthritis    Past Surgical History:  Procedure Laterality Date   BLEPHAROPLASTY Bilateral 11/16/2021   upper lid   CATARACT EXTRACTION Right 07/21/2018   CHOLECYSTECTOMY     laparoscopic '92   CORONARY ARTERY BYPASS GRAFT     graft '92: LIMA-LAD, SVG - D2,R1, D1   ENTEROSCOPY  08/22/2012   Procedure: ENTEROSCOPY;  Surgeon: Charna Elizabeth, MD;  Location: [facility];  Service: Endoscopy;   Laterality: N/A;   INTRAOCULAR LENS EXCHANGE Right 07/21/2018   KNEE SURGERY     MOLE REMOVAL  2001 and 2008   PAROTIDECTOMY Right 06/18/2021   PILONIDAL CYST EXCISION     Patient Active Problem List   Diagnosis Date Noted   CKD (chronic kidney disease) stage 3, GFR 30-59 ml/min (HCC) 07/01/2022   Right leg swelling 12/17/2021   Lymphadenopathy, inguinal, right 12/17/2021   Primary osteoarthritis of right knee 07/10/2021   Lumbar spondylosis 04/16/2019   CHF (congestive heart failure) (HCC) 12/17/2016   PCP NOTES >>>>> 10/11/2015   Carpal tunnel syndrome 02/15/2014   Anemia 08/14/2012   Annual physical exam 01/08/2012   Atrial fibrillation (HCC) 03/29/2011   Venous (peripheral) insufficiency 09/04/2009   OBESITY 07/04/2009   DJD (degenerative joint disease) 07/04/2009   SLEEP DISORDER 07/04/2009   Dyspepsia 02/28/2009   DM II (diabetes mellitus, type II), controlled (HCC) 04/25/2008   Hyperlipidemia 06/25/2007   Essential hypertension 06/25/2007   CAD (coronary artery disease) of artery bypass graft 06/25/2007    PCP: Dr [name]  REFERRING PROVIDER: Dr [name]  REFERRING DIAG: Lt knee pain   THERAPY DIAG:  Chronic pain  of left knee  Muscle weakness (generalized)  Other symptoms and signs involving the musculoskeletal system  Rationale for Evaluation and Treatment: Rehabilitation  ONSET DATE: 04/29/23  SUBJECTIVE:   SUBJECTIVE STATEMENT: Patient reports that he has no pain in the L LE. He played golf yesterday and had no pain.    Eval: Patient reports that he noticed the onset of Lt knee pain the day after he [place] his knee [place] [facility] [facility] [employer] [facility]. He noticed on 04/30/23. Now pain in the Lt posterior knee through and some into the lateral leg area. Pain has gotten a little better since the 22nd.   PERTINENT HISTORY: Arthritis bilat knees; scope Rt knee with continued limited ROM/strength Rt LE; CAD; CABG; HTN PAIN:  Are you having  pain? Yes: NPRS scale: 0/10 Pain location: posterior Lt knee distal HS to proximal calf; lateral leg at fibular area  Pain description: dull; ache Aggravating factors: walking; standing; squatting; bending over  Relieving factors: sitting in recliner with feet up; heat   PRECAUTIONS: None  WEIGHT BEARING RESTRICTIONS: No  FALLS:  Has patient fallen in last 6 months? No  OCCUPATION: retired; Naval architect; golf's ~ 3 times/wk; gym 3 days/week (weights; bicycle - now just working upper body); yard work; mowing    PATIENT GOALS: get rid of the knee pain   NEXT MD VISIT: 08/12/23  OBJECTIVE:   DIAGNOSTIC FINDINGS: arthritic changes bilat knees  PATIENT SURVEYS:  FOTO - 47; goal 60 06/17/23: 73   MUSCLE LENGTH: Hamstrings: Right 55 deg; Left 60 deg Thomas test: Right tight deg; Left tight deg  POSTURE: rounded shoulders, forward head, and flexed trunk   PALPATION: Tightness to palpation Lt hamstring and gastroc/soleus complex    06/17/23: tightness medial hamstring  LOWER EXTREMITY ROM:  Active ROM Right eval Left eval Right 06/11/23 Left 06/11/23  Hip flexion Tight  Tight     Hip extension Tight  Tight     Hip abduction      Hip adduction      Hip internal rotation Tight  Tight     Hip external rotation Tight  Tight     Knee flexion 95 104 100 120  Knee extension -11 -4 -2 0  Ankle dorsiflexion      Ankle plantarflexion      Ankle inversion      Ankle eversion       (Blank rows = not tested)  LOWER EXTREMITY MMT:  MMT Right eval Left eval Right 06/11/23 Left 06/11/23 Left  06/17/23  Hip flexion 4+ 4 4+ 4+ 5  Hip extension 4 4 4+ 4+ 5-  Hip abduction 4+ 4 5 4+ 5  Hip adduction       Hip internal rotation       Hip external rotation       Knee flexion 5 4+ 5 5 5   Knee extension 5 5- 5 5 5   Ankle dorsiflexion 5 5     Ankle plantarflexion       Ankle inversion       Ankle eversion        (Blank rows = not tested)   FUNCTIONAL TESTS:  5 times sit to stand:  14.63 sec use of bilat UE's; 21 inch surface    06/17/23: 12.48 hands on knees x 2 then no hands   GAIT: Distance walked: 40 Assistive device utilized: None Level of assistance: Complete Independence Comments: LE's in ER; fwd flexed  posture; limp Lt LE in WB Lt; antalgic gait  06/17/23: improved gait with no pain L; continues to have limp R LE due to long standing knee pain   [facility] Adult PT Treatment:                                                DATE: 06/19/2023 Therapeutic Exercise: NuStep L6 x 5.5 min Seated HS stretch 30 sec x 3 Supine HS stretch 30 sec x 2 Supine adductor/medial quad stretch 30 sec x 2  Prone quad stretch 30 sec x 2 Standing hip extension x 10 x 3 R/L Side stepping GTB at ankles at wall 10 ft x 6 R/L  Side lying hips/knees flexed for hip abduction lifting knee 10 x 2; knees together lifting foot 10 x 2  Bent over hip ext YTB crossed at ankles 2x10 B Manual Therapy: IASTM L HS & gastroc  [facility] Adult PT Treatment:                                                DATE: 06/17/2023 Therapeutic Exercise: NuStep L6 x 5 min Seated HS stretch 30 sec x 3 Standing hip extension x 10 x 2 R/L Side stepping GTB at ankles at wall 10 ft x 6 R/L  Side lying hips/knees flexed for hip abduction lifting knee 10 x 2; knees together lifting foot 10 x 2  Bent over hip ext YTB crossed at ankles 2x10 B Manual Therapy: IASTM L HS & gastroc   [facility] Adult PT Treatment:    (dry needling)       DATE: 05/22/23 Therapeutic Exercise: Nustep L6 x 6 min Supine  Hamstring stretch w/strap 30 sec x 3 Quad set towel behind knee 3 sec x 10  Mini SLR 3 sec x 10  Neural mobilization knee to ceiling x 10  Sitting  HS stretch 30 sec x 3  Standing  Gastro stretch 30 sec x 3  Soleus stretch 30 sec x 2 Manual Therapy: Skilled palpation to assess response to manual work and DN IASTM Lt HS and calf  Trigger Point Dry-Needling  Treatment instructions: Expect mild to moderate muscle soreness. S/S of  pneumothorax if dry needled over a lung field, and to seek immediate medical attention should they occur. Patient verbalized understanding of these instructions and education.  Patient Consent Given: Yes Education handout provided: Previously provided Muscles treated: Lt hamstring - medial and lateral; Lt gastroc Electrical stimulation performed: Yes Parameters: mAmp current intensity to pt tolerance  Treatment response/outcome: decreased palpable tightness  Gait: Ambulation in clinic post treatment with improved gait pattern with increased wt bearing Lt LE in stance phase; no pain  Self Care: Myofacial ball release hamstring and calf sitting Importance of exercises Added heel lift R LE to adjust to leg length difference    PATIENT EDUCATION:  Education details: POC; HEP Person educated: Patient Education method: Programmer, multimedia, Demonstration, Tactile cues, Verbal cues, and Handouts Education comprehension: verbalized understanding, returned demonstration, verbal cues required, tactile cues required, and needs further education  HOME EXERCISE PROGRAM: Access Code: [email or web address] URL: [email or web address] Date: 05/20/2023 Prepared by: Corlis Leak  Exercises - Hooklying Hamstring Stretch with Strap  - 2 x daily - 7 x  weekly - 1 sets - 3 reps - 30 sec  hold - Supine Quad Set  - 2 x daily - 7 x weekly - 1 sets - 10 reps - 3 sec  hold - Small Range Straight Leg Raise  - 2 x daily - 7 x weekly - 1 sets - 10 reps - 5 sec  hold - Seated Hamstring Stretch  - 2 x daily - 7 x weekly - 1 sets - 3 reps - 30 sec  hold - Gastroc Stretch on Wall  - 2 x daily - 7 x weekly - 1 sets - 3 reps - 30 sec  hold - Soleus Stretch on Wall  - 2 x daily - 7 x weekly - 1 sets - 3 reps - 30 sec  hold - Supine Sciatic Nerve Glide  - 2 x daily - 7 x weekly - 1 sets - 8-10 reps - 1-2 sec  hold  Patient Education - Trigger Point Dry Needling  ASSESSMENT:  CLINICAL IMPRESSION: Progressing well with L  knee rehab. He has been been pain free for the past week. He is playing golf without difficulty during or following golfing. Patient demonstrates increased strength L LE.  He has good gains in bilateral hip extension and abduction strength. Patient has returned to playing golf with no adverse effects during or after game. Good progression noted with bilateral knee AROM measurements, with most significant increase noted with R knee extension and L knee flexion. Patient will continue come for one additional appointment to review HEP and make sure he has no questions or problems with the exercises. Goals of therapy have been accomplished.    OBJECTIVE IMPAIRMENTS:  Lt knee pain with probable injury 04/29/23 following end range flexion strain on the recumbent bike in the gym. He noticed pain the following day. Pain is in the Lt posterior hamstring and calf with some irritation along the lateral leg/fibula area. Patient has limited ROM; decreased strength; muscular tightness to palpation; abnormal gait pattern; decreased functional and recreational activities. Pain is in the Lt posterior hamstring and calf with some irritation along the lateral leg/fibula area. Patient has limited ROM; decreased strength; muscular tightness to palpation; abnormal gait pattern; decreased functional and recreational activities Abnormal gait, decreased activity tolerance, decreased balance, decreased endurance, decreased mobility, decreased ROM, decreased strength, increased fascial restrictions, impaired flexibility, improper body mechanics, postural dysfunction, and pain.    GOALS: Goals reviewed with patient? Yes  SHORT TERM GOALS: Target date: 06/24/2023  Independent in initial HEP  Baseline: Goal status: met  2.  Increase Lt knee ROM to 110 flexion and -5 extension  Baseline:  Goal status: met   LONG TERM GOALS: Target date: 08/05/2023  Improved gait pattern with increased wt bearing Lt LE and more normal gait pattern   Baseline:  Goal status: met  2.  AROM Lt knee 112 flexion to 0 extension  Baseline: see above Goal status: met  3.  4+/5 to 5/5 strength Lt LE  Baseline:  Goal status: met  4.  Decreased pain to no more than 2/10 for functional activities  Baseline: 2/10 Goal status: MET  5.  Return to all functional and recreational activities including golf  Baseline:  Goal status: MET  6.  Independent in HEP including aquatic program as indicated  Baseline:  Goal status: met      7.  Improve functional limitation score to 60    Baseline: 55    Goal status: met   PLAN:  PT FREQUENCY: 2x/week  PT DURATION: 12 weeks  PLANNED INTERVENTIONS: Therapeutic exercises, Therapeutic activity, Neuromuscular re-education, Balance training, Gait training, Patient/Family education, Self Care, Joint mobilization, Stair training, Aquatic Therapy, Electrical stimulation, Cryotherapy, Moist heat, Taping, Ultrasound, Ionotophoresis 4mg /ml Dexamethasone, Manual therapy, and Re-evaluation  PLAN FOR NEXT SESSION: Hamstring/lateral thigh stretching, quad/glute/core strengthening; manual work including STM/IASTM through the Lt hamstrings and calf, DN, modalities as indicated    Selestino Nila Rober Minion, PT 06/19/2023, 1:14 PM

## 2023-07-01 ENCOUNTER — Other Ambulatory Visit: Payer: Self-pay | Admitting: Cardiology

## 2023-07-16 ENCOUNTER — Telehealth: Payer: Self-pay | Admitting: Internal Medicine

## 2023-07-21 ENCOUNTER — Ambulatory Visit: Payer: Self-pay | Admitting: Licensed Clinical Social Worker

## 2023-07-21 NOTE — Telephone Encounter (Signed)
Pt states he pharmacy has still not received the rx and needs it resent.   benazepril (LOTENSIN) 20 MG tablet    [id number] - [place], [place] - [address] [facility] [address],  [place] [address] Phone: [phone]  Fax: [phone]

## 2023-07-21 NOTE — Patient Instructions (Signed)
Visit Information  Thank you for taking time to visit with me today. Please don't hesitate to contact me if I can be of assistance to you.   Following are the goals we discussed today:   Goals Addressed             This Visit's Progress    Patient Stated he is having pain issues in his left knee       Interventions:  LCSW spoke with client  via phone today to discuss client current status and needs Discussed program support with client. Discussed program support with RN, LCSW, Pharmacist Discussed medication procurement Discussed sleeping of client. Client said he is sleeping well. Client said he is eating adequately  Client has family support from his brother Discussed transport situation. Client drives himself to and from  medical appointments. Client drives in community as needed to complete errands. Client is not having any transport issues Discussed ambulation of client. He said he is walking well. He likes to pay golf for exercise Discussed ADLs completion of client Encouraged client to utilize program support as needed.  Encouraged [name] to call LCSW as needed for SW support at [phone]        Our next appointment is by telephone on 09/08/23 at 1:00 PM   Please call the care guide team at [phone] if you need to cancel or reschedule your appointment.   If you are experiencing a Mental Health or Behavioral Health Crisis or need someone to talk to, please go to [facility] [address], [place] [phone])   The patient verbalized understanding of instructions, educational materials, and care plan provided today and DECLINED offer to receive copy of patient instructions, educational materials, and care plan.   The patient has been provided with contact information for the care management team and has been advised to call with any health related questions or concerns.   [name]. MSW, LCSW Licensed Equities trader [facility] Care Management [phone]

## 2023-07-21 NOTE — Patient Outreach (Signed)
  Care Coordination   Follow Up Visit Note   07/21/2023 Name: [name] MRN: [id number] DOB: 1931-04-20  [name] is a 89 y.o. year old male who sees [name], [name], MD for primary care. I spoke with  [name] by phone today.  What matters to the patients health and wellness today? Patient has occasional pain issues in his left knee      Goals Addressed             This Visit's Progress    Patient Stated he is having pain issues in his left knee       Interventions:  LCSW spoke with client  via phone today to discuss client current status and needs Discussed program support with client. Discussed program support with RN, LCSW, Pharmacist Discussed medication procurement Discussed sleeping of client. Client said he is sleeping well. Client said he is eating adequately  Client has family support from his brother Discussed transport situation. Client drives himself to and from  medical appointments. Client drives in community as needed to complete errands. Client is not having any transport issues Discussed ambulation of client. He said he is walking well. He likes to pay golf for exercise Discussed ADLs completion of client Encouraged client to utilize program support as needed.  Encouraged Ryant to call LCSW as needed for SW support at [phone]        SDOH assessments and interventions completed:  Yes  SDOH Interventions Today    Flowsheet Row Most Recent Value  SDOH Interventions   Stress Interventions Other (Comment)  [client has stress in managing medical needs]  Social Connections Interventions --  [risk of social isolatoin]        Care Coordination Interventions:  Yes, provided   Interventions Today    Flowsheet Row Most Recent Value  Chronic Disease   Chronic disease during today's visit Other  [spoke with client about client needs]  General Interventions   General Interventions Discussed/Reviewed General Interventions Discussed, [employer] program support with RN, LCSW, Pharmacist]  Exercise Interventions   Exercise Discussed/Reviewed Physical Activity  [client likes to play golf for exercise]  Physical Activity Discussed/Reviewed Physical Activity Discussed  Education Interventions   Education Provided Provided Education  Provided Verbal Education On Community Resources  Mental Health Interventions   Mental Health Discussed/Reviewed Coping Strategies  [no mood issues noted]  Pharmacy Interventions   Pharmacy Dicussed/Reviewed Pharmacy Topics Discussed        Follow up plan: Follow up call scheduled for 09/08/23 at 1:00 PM     Encounter Outcome:  Pt. Visit Completed   [name]. MSW, LCSW Licensed Visual merchandiser [facility] Care Management [phone]

## 2023-07-22 ENCOUNTER — Other Ambulatory Visit: Payer: Self-pay | Admitting: Internal Medicine

## 2023-07-22 ENCOUNTER — Other Ambulatory Visit: Payer: Self-pay

## 2023-07-22 DIAGNOSIS — I10 Essential (primary) hypertension: Secondary | ICD-10-CM

## 2023-07-22 MED ORDER — BENAZEPRIL HCL 20 MG PO TABS
20.0000 mg | ORAL_TABLET | Freq: Every day | ORAL | 1 refills | Status: DC
Start: 2023-07-22 — End: 2024-01-19

## 2023-07-22 NOTE — Telephone Encounter (Signed)
Refill has been sent.  °

## 2023-07-22 NOTE — Telephone Encounter (Signed)
Pt called back to follow-up on previous message regarding the medication benazepril (LOTENSIN) 20 MG tablet. He stated the pharmacy still has not received this medication. Please re-send rx and advise pt.

## 2023-07-24 ENCOUNTER — Encounter (INDEPENDENT_AMBULATORY_CARE_PROVIDER_SITE_OTHER): Payer: Self-pay

## 2023-08-01 NOTE — Progress Notes (Unsigned)
HPI: FU coronary artery disease and atrial fibrillation. Patient is status post coronary artery bypassing graft in 1993 with LIMA to the LAD, saphenous vein graft to the ramus intermedius and saphenous vein graft to the first diagonal. CT of the abdomen in January of 2009 showed no renal artery stenosis. There was no aortic aneurysm. Nuclear study in June 2014 showed scar in the mid/apical inferior wall and apex. There was a small area of ischemia in the base to mid anterior wall. The study was unchanged compared to May of 2012. The study was not gated. We have treated medically.  Echocardiogram September 2022 showed ejection fraction 40 to 45%, mild to moderate left ventricular enlargement, mild left ventricular hypertrophy, mild right ventricular enlargement, moderate left atrial enlargement, mild mitral regurgitation, mild to moderate tricuspid regurgitation and mildly dilated ascending aorta at 41 mm.  Since last seen, the patient denies any dyspnea on exertion, orthopnea, PND, pedal edema, palpitations, syncope or chest pain.   Current Outpatient Medications  Medication Sig Dispense Refill   acetaminophen (TYLENOL) 650 MG CR tablet Take 1 tablet (650 mg total) by mouth in the morning and at bedtime. 90 tablet 3   apixaban (ELIQUIS) 2.5 MG TABS tablet TAKE 1 TABLET(2.5 MG) BY MOUTH TWICE DAILY 180 tablet 3   atorvastatin (LIPITOR) 40 MG tablet TAKE 1 TABLET(40 MG) BY MOUTH DAILY 90 tablet 1   benazepril (LOTENSIN) 20 MG tablet Take 1 tablet (20 mg total) by mouth daily. 90 tablet 1   clotrimazole-betamethasone (LOTRISONE) cream Apply 1 Application topically 2 (two) times daily. 30 g 0   Coenzyme Q10 (COQ10) 100 MG CAPS Take 1 capsule by mouth daily.      furosemide (LASIX) 40 MG tablet Take 1 tablet (40 mg total) by mouth daily. 90 tablet 1   Melatonin 10 MG TABS Take 10 mg by mouth at bedtime.      metoprolol succinate (TOPROL-XL) 25 MG 24 hr tablet Take 1 tablet (25 mg total) by mouth  daily. Take with or immediately following a meal 90 tablet 1   Multiple Vitamin (MULTIVITAMIN WITH MINERALS) TABS tablet Take 1 tablet by mouth daily.     Semaglutide (RYBELSUS) 7 MG TABS Take 1 tablet (7 mg total) by mouth daily. 90 tablet 1   tamsulosin (FLOMAX) 0.4 MG CAPS capsule Take 1 capsule (0.4 mg total) by mouth daily after supper. 90 capsule 1   No current facility-administered medications for this visit.     Past Medical History:  Diagnosis Date   Anemia    Antral gastritis 2013   EGD    Atrial fibrillation (HCC)    CAD (coronary artery disease)    s/p CABG 1993 (L-LAD, S-RI, S-D1);   echo 1/10: EF 55%;    myoview 12/09: inf MI, no ischemia   Candida esophagitis (HCC) 2013   EGD    Cataracts, bilateral    Family history of malignant neoplasm of gastrointestinal tract    Folliculitis    Hiatal hernia    HTN (hypertension)    Hyperlipidemia    Irritable bladder    Myocardial infarction [facility])    Obesity    Osteoarthritis     Past Surgical History:  Procedure Laterality Date   BLEPHAROPLASTY Bilateral 11/16/2021   upper lid   CATARACT EXTRACTION Right 07/21/2018   CHOLECYSTECTOMY     laparoscopic '92   CORONARY ARTERY BYPASS GRAFT     graft '92: LIMA-LAD, SVG - D2,R1, D1   ENTEROSCOPY  08/22/2012   Procedure: ENTEROSCOPY;  Surgeon: Charna Elizabeth, MD;  Location: [facility];  Service: Endoscopy;  Laterality: N/A;   INTRAOCULAR LENS EXCHANGE Right 07/21/2018   KNEE SURGERY     MOLE REMOVAL  2001 and 2008   PAROTIDECTOMY Right 06/18/2021   PILONIDAL CYST EXCISION      Social History   Socioeconomic History   Marital status: Widowed    Spouse name: Not on file   Number of children: 1   Years of education: Not on file   Highest education level: Not on file  Occupational History   Occupation: Retired    Associate Professor: RETIRED  Tobacco Use   Smoking status: Former    Current packs/day: 0.00    Types: Cigarettes    Start date: 08/14/1948    Quit date: 08/14/1964     Years since quitting: 59.0   Smokeless tobacco: Never   Tobacco comments:    quit 1965  Vaping Use   Vaping status: Never Used  Substance and Sexual Activity   Alcohol use: Not Currently    Alcohol/week: 7.0 standard drinks of alcohol    Types: 7 Shots of liquor per week   Drug use: No   Sexual activity: Not on file  Other Topics Concern   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 Married '57- widowed 12-Aug-2023. 1 son -'54- died '2023-08-19 MVA.    Lives w/ girlfriend     Emergency contact: see DPR, brother and  Katheren Puller [phone] (friend)   Social Determinants of Health   Financial Resource Strain: Low Risk  (06/18/2021)   Received from [facility], [facility]   Overall Financial Resource Strain (CARDIA)    Difficulty of Paying Living Expenses: Not hard at all  Food Insecurity: No Food Insecurity (08/01/2021)   Hunger Vital Sign    Worried About Running Out of Food in the Last Year: Never true    Ran Out of Food in the Last Year: Never true  Transportation Needs: No Transportation Needs (08/01/2021)   PRAPARE - Administrator, Civil Service (Medical): No    Lack of Transportation (Non-Medical): No  Physical Activity: Insufficiently Active (05/19/2023)   Exercise Vital Sign    Days of Exercise per Week: 5 days    Minutes of Exercise per Session: 20 min  Stress: Stress Concern Present (07/21/2023)   [employer] of Occupational Health - Occupational Stress Questionnaire    Feeling of Stress : To some extent  Social Connections: Moderately Isolated (07/21/2023)   Social Connection and Isolation Panel [NHANES]    Frequency of Communication with Friends and Family: Twice a week    Frequency of Social Gatherings with Friends and Family: Twice a week    Attends Religious Services: Never    Database administrator or Organizations: Yes    Attends Banker Meetings: Never    Marital Status: Widowed  Intimate Partner  Violence: Unknown (03/11/2022)   Received from [facility], [facility]   HITS    Physically Hurt: Not on file    Insult or Talk Down To: Not on file    Threaten Physical Harm: Not on file    Scream or Curse: Not on file    Family History  Problem Relation Age of Onset   Coronary artery disease Father        and brother   Heart attack Father  and brother-fatal   Stroke Father    Breast cancer Mother    Alzheimer's disease Mother    Colon cancer Maternal Uncle    Esophageal cancer Neg Hx    Stomach cancer Neg Hx    Rectal cancer Neg Hx    Prostate cancer Neg Hx     ROS: no fevers or chills, productive cough, hemoptysis, dysphasia, odynophagia, melena, hematochezia, dysuria, hematuria, rash, seizure activity, orthopnea, PND, pedal edema, claudication. Remaining systems are negative.  Physical Exam: Well-developed well-nourished in no acute distress.  Skin is warm and dry.  HEENT is normal.  Neck is supple.  Chest is clear to auscultation with normal expansion.  Cardiovascular exam is irregular Abdominal exam nontender or distended. No masses palpated. Extremities show no edema. neuro grossly intact   A/P  1 permanent atrial fibrillation-continue Toprol and apixaban.  2 hypertension-blood pressure controlled.  Continue present medications.  3 hyperlipidemia-continue statin.  4 history of coronary artery disease-continue statin.  No aspirin given need for anticoagulation.  5 chronic combined systolic/diastolic congestive heart failure-will continue diuretic at present dose.  SGLT2 inhibitor discontinued previously due to peritoneal infection.  Olga Millers, MD

## 2023-08-06 ENCOUNTER — Encounter: Payer: Self-pay | Admitting: Cardiology

## 2023-08-06 ENCOUNTER — Ambulatory Visit: Payer: Medicare Other | Admitting: Cardiology

## 2023-08-06 VITALS — BP 152/68 | HR 64 | Ht 71.0 in | Wt 215.0 lb

## 2023-08-06 DIAGNOSIS — I5042 Chronic combined systolic (congestive) and diastolic (congestive) heart failure: Secondary | ICD-10-CM | POA: Diagnosis not present

## 2023-08-06 DIAGNOSIS — I2510 Atherosclerotic heart disease of native coronary artery without angina pectoris: Secondary | ICD-10-CM

## 2023-08-06 DIAGNOSIS — E7800 Pure hypercholesterolemia, unspecified: Secondary | ICD-10-CM

## 2023-08-06 DIAGNOSIS — I10 Essential (primary) hypertension: Secondary | ICD-10-CM

## 2023-08-06 DIAGNOSIS — I4821 Permanent atrial fibrillation: Secondary | ICD-10-CM

## 2023-08-06 NOTE — Patient Instructions (Signed)
Medication Instructions:   Your physician recommends that you continue on your current medications as directed. Please refer to the Current Medication list given to you today.   *If you need a refill on your cardiac medications before your next appointment, please call your pharmacy*   Lab Work:  None ordered.  If you have labs (blood work) drawn today and your tests are completely normal, you will receive your results only by: MyChart Message (if you have MyChart) OR A paper copy in the mail If you have any lab test that is abnormal or we need to change your treatment, we will call you to review the results.   Testing/Procedures:  None ordered.    Follow-Up: At [facility], you and your health needs are our priority.  As part of our continuing mission to provide you with exceptional heart care, we have created designated Provider Care Teams.  These Care Teams include your primary Cardiologist (physician) and Advanced Practice Providers (APPs -  Physician Assistants and Nurse Practitioners) who all work together to provide you with the care you need, when you need it.  We recommend signing up for the patient portal called "MyChart".  Sign up information is provided on this After Visit Summary.  MyChart is used to connect with patients for Virtual Visits (Telemedicine).  Patients are able to view lab/test results, encounter notes, upcoming appointments, etc.  Non-urgent messages can be sent to your provider as well.   To learn more about what you can do with MyChart, go to [email or web address].    Your next appointment:   1 year(s)  Provider:   Olga Millers, MD    Other Instructions  Your physician wants you to follow-up in: 1 year.  You will receive a reminder letter in the mail two months in advance. If you don't receive a letter, please call our office to schedule the follow-up appointment.

## 2023-08-07 DIAGNOSIS — H02105 Unspecified ectropion of left lower eyelid: Secondary | ICD-10-CM | POA: Diagnosis not present

## 2023-08-07 DIAGNOSIS — H526 Other disorders of refraction: Secondary | ICD-10-CM | POA: Diagnosis not present

## 2023-08-07 DIAGNOSIS — H35363 Drusen (degenerative) of macula, bilateral: Secondary | ICD-10-CM | POA: Diagnosis not present

## 2023-08-07 DIAGNOSIS — Z961 Presence of intraocular lens: Secondary | ICD-10-CM | POA: Diagnosis not present

## 2023-08-07 DIAGNOSIS — H02831 Dermatochalasis of right upper eyelid: Secondary | ICD-10-CM | POA: Diagnosis not present

## 2023-08-07 DIAGNOSIS — E119 Type 2 diabetes mellitus without complications: Secondary | ICD-10-CM | POA: Diagnosis not present

## 2023-08-07 DIAGNOSIS — H02403 Unspecified ptosis of bilateral eyelids: Secondary | ICD-10-CM | POA: Diagnosis not present

## 2023-08-07 DIAGNOSIS — H02834 Dermatochalasis of left upper eyelid: Secondary | ICD-10-CM | POA: Diagnosis not present

## 2023-08-07 LAB — HM DIABETES EYE EXAM

## 2023-08-08 NOTE — Telephone Encounter (Signed)
FAXED   application for RYBELSUS NOVO NORDISK FOR REORDER REFILL TO PROVIDER OFFICE.   PLEASE BE ADVISED

## 2023-08-12 ENCOUNTER — Ambulatory Visit (INDEPENDENT_AMBULATORY_CARE_PROVIDER_SITE_OTHER): Payer: Medicare Other | Admitting: Internal Medicine

## 2023-08-12 ENCOUNTER — Encounter: Payer: Self-pay | Admitting: Internal Medicine

## 2023-08-12 VITALS — BP 130/66 | HR 57 | Temp 98.1°F | Resp 16 | Ht 71.0 in | Wt 210.5 lb

## 2023-08-12 DIAGNOSIS — E1159 Type 2 diabetes mellitus with other circulatory complications: Secondary | ICD-10-CM | POA: Diagnosis not present

## 2023-08-12 DIAGNOSIS — N1832 Chronic kidney disease, stage 3b: Secondary | ICD-10-CM | POA: Diagnosis not present

## 2023-08-12 DIAGNOSIS — Z7984 Long term (current) use of oral hypoglycemic drugs: Secondary | ICD-10-CM

## 2023-08-12 LAB — BASIC METABOLIC PANEL
BUN: 23 mg/dL (ref 6–23)
CO2: 27 meq/L (ref 19–32)
Calcium: 9.7 mg/dL (ref 8.4–10.5)
Chloride: 104 meq/L (ref 96–112)
Creatinine, Ser: 1.85 mg/dL — ABNORMAL HIGH (ref 0.40–1.50)
GFR: 31.84 mL/min — ABNORMAL LOW (ref >60.00)
Glucose, Bld: 126 mg/dL — ABNORMAL HIGH (ref 70–99)
Potassium: 4.4 meq/L (ref 3.5–5.1)
Sodium: 140 meq/L (ref 135–145)

## 2023-08-12 LAB — HEMOGLOBIN A1C: Hgb A1c MFr Bld: 6.5 % (ref 4.6–6.5)

## 2023-08-12 NOTE — Progress Notes (Signed)
Subjective:    Patient ID: [name], male    DOB: 1931/06/19, 89 y.o.   MRN: [id number]  DOS:  08/12/2023 Type of visit - description: Routine follow-up  Feels well other than normal aches and pains. Still active. Saw cardiology, note reviewed. Denies any chest pain, difficulty breathing. Anticoagulated, denies blood in the stools or in the urine  Review of Systems See above   Past Medical History:  Diagnosis Date   Anemia    Antral gastritis 2013   EGD    Atrial fibrillation (HCC)    CAD (coronary artery disease)    s/p CABG 1993 (L-LAD, S-RI, S-D1);   echo 1/10: EF 55%;    myoview 12/09: inf MI, no ischemia   Candida esophagitis (HCC) 2013   EGD    Cataracts, bilateral    Family history of malignant neoplasm of gastrointestinal tract    Folliculitis    Hiatal hernia    HTN (hypertension)    Hyperlipidemia    Irritable bladder    Myocardial infarction [facility])    Obesity    Osteoarthritis     Past Surgical History:  Procedure Laterality Date   BLEPHAROPLASTY Bilateral 11/16/2021   upper lid   CATARACT EXTRACTION Right 07/21/2018   CHOLECYSTECTOMY     laparoscopic '92   CORONARY ARTERY BYPASS GRAFT     graft '92: LIMA-LAD, SVG - D2,R1, D1   ENTEROSCOPY  08/22/2012   Procedure: ENTEROSCOPY;  Surgeon: Charna Elizabeth, MD;  Location: [facility];  Service: Endoscopy;  Laterality: N/A;   INTRAOCULAR LENS EXCHANGE Right 07/21/2018   KNEE SURGERY     MOLE REMOVAL  2001 and 2008   PAROTIDECTOMY Right 06/18/2021   PILONIDAL CYST EXCISION      Current Outpatient Medications  Medication Instructions   acetaminophen (TYLENOL) 650 mg, Oral, 2 times daily   apixaban (ELIQUIS) 2.5 MG TABS tablet TAKE 1 TABLET(2.5 MG) BY MOUTH TWICE DAILY   atorvastatin (LIPITOR) 40 MG tablet TAKE 1 TABLET(40 MG) BY MOUTH DAILY   benazepril (LOTENSIN) 20 mg, Oral, Daily   clotrimazole-betamethasone (LOTRISONE) cream 1 Application, Topical, 2 times daily   Coenzyme Q10 (COQ10) 100 MG CAPS  1 capsule, Oral, Daily   furosemide (LASIX) 40 mg, Oral, Daily   Melatonin 10 mg, Oral, Daily at bedtime   metoprolol succinate (TOPROL-XL) 25 mg, Oral, Daily, Take with or immediately following a meal   Multiple Vitamin (MULTIVITAMIN WITH MINERALS) TABS tablet 1 tablet, Oral, Daily   Rybelsus 7 mg, Oral, Daily   tamsulosin (FLOMAX) 0.4 mg, Oral, Daily after supper       Objective:   Physical Exam BP 130/66   Pulse (!) 57   Temp 98.1 F (36.7 C) (Oral)   Resp 16   Ht 5\' 11"  (1.803 m)   Wt 210 lb 8 oz (95.5 kg)   SpO2 94%   BMI 29.36 kg/m  General:   Well developed, NAD, BMI noted. HEENT:  Normocephalic . Face symmetric, atraumatic Lungs:  CTA B Normal respiratory effort, no intercostal retractions, no accessory muscle use. Heart: Bradycardic, seems regular today. DM foot exam: Well-perfused toes.  Pinprick examination normal. Skin: Not pale. Not jaundice Neurologic:  alert & oriented X3.  Speech normal, gait appropriate for age and unassisted Psych--  Cognition and judgment appear intact.  Cooperative with normal attention span and concentration.  Behavior appropriate. No anxious or depressed appearing.      Assessment     ASSESSMENT  DM:+ mild neuropathy.  avoiding Metformin -CKD. Avoiding Actos d/t h/o  CHF.  ; Farxiga 12-2022 (balanitis) HTN Hyperlipidemia CKD CV: --CAD, CABG 1993, Myoview 2009 no ischemia. --Atrial fibrillation -- rate control, xarelto -- chronic combined systolic/diastolic congestive heart failure Venous insufficiency, mild LE edema L>>R  LUTS    MSK: DJD, spinal stenosis, R wrist Fx in the 80s GI: --Recurrent melena: Work-up 2013 Dr. Jarold Motto: Colonoscopy, EGD, capsule endoscopy.  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 Feet exam  negative. CKD: Checking a BMP. CAD, A-fib, CHF Saw cardiology 08/06/2023, no changes made.  Anticoagulated, seems to be tolerating well. Social: Active, gym 3 times a week, stationary bike, play some golf. RTC 4 months CPX

## 2023-08-12 NOTE — Patient Instructions (Addendum)
Vaccines I recommend: Covid booster- new this fall Flu shot this fall RSV vaccine  Continue checking your blood pressures regularly Blood pressure goal:  between 110/65 and  135/85. If it is consistently higher or lower, let me know     GO TO THE LAB : Get the blood work     GO TO THE FRONT DESK, PLEASE SCHEDULE YOUR APPOINTMENTS Come back for for complete physical exam by 12-2023   Per our records you are due for your diabetic eye exam. Please contact your eye doctor to schedule an appointment. Please have them send copies of your office visit notes to [place]. Our fax number is [phone]. If you need a referral to an eye doctor please let us know.

## 2023-08-12 NOTE — Assessment & Plan Note (Signed)
DM: On Rybelsus, last A1c satisfactory, recheck. Feet exam negative. CKD: Checking a BMP. CAD, A-fib, CHF Saw cardiology 08/06/2023, no changes made.  Anticoagulated, seems to be tolerating well. Social: Active, gym 3 times a week, stationary bike, play some golf. RTC 4 months CPX

## 2023-08-19 NOTE — Telephone Encounter (Signed)
PAP: Application for Rybelsus has been submitted to PAP Companies: [employer], via fax   PLEASE BE ADVISED REORDER FOR RYBELSUS 7MG    PROVIDER PAGES ARE IN MEDIA OF CHART

## 2023-08-22 NOTE — Telephone Encounter (Signed)
PAP: Patient assistance application for Rybelsus has been approved by PAP Companies: [employer] from 08/21/2023 to 12/09/2023. Medication should be delivered to PAP Delivery: Provider's office For further shipping updates, please contact [employer] at [phone] Pt ID is: [id number]   Please be advised

## 2023-09-01 ENCOUNTER — Other Ambulatory Visit: Payer: Self-pay | Admitting: Internal Medicine

## 2023-09-02 DIAGNOSIS — L821 Other seborrheic keratosis: Secondary | ICD-10-CM | POA: Diagnosis not present

## 2023-09-02 DIAGNOSIS — D692 Other nonthrombocytopenic purpura: Secondary | ICD-10-CM | POA: Diagnosis not present

## 2023-09-02 DIAGNOSIS — Z86008 Personal history of in-situ neoplasm of other site: Secondary | ICD-10-CM | POA: Diagnosis not present

## 2023-09-02 DIAGNOSIS — L570 Actinic keratosis: Secondary | ICD-10-CM | POA: Diagnosis not present

## 2023-09-02 DIAGNOSIS — Z85828 Personal history of other malignant neoplasm of skin: Secondary | ICD-10-CM | POA: Diagnosis not present

## 2023-09-02 DIAGNOSIS — C44622 Squamous cell carcinoma of skin of right upper limb, including shoulder: Secondary | ICD-10-CM | POA: Diagnosis not present

## 2023-09-05 ENCOUNTER — Telehealth: Payer: Self-pay | Admitting: Internal Medicine

## 2023-09-05 NOTE — Telephone Encounter (Signed)
Pt came in office wanting to know if Pharmacist received document that he dropped off on Monday 09-01-2023 regarding about his meds. Please advise pt the status of his paperwork, Pt tel [phone] or [phone]

## 2023-09-05 NOTE — Telephone Encounter (Signed)
I sent a Teams messaged medication assistance team Wednesday 9/25 when patient brought in letter that [employer] needed to verify patient's DOB. Linward Foster stated she had already received letter and clarified DOB with [employer].   "Good Morning Paulett Kaufhold.. Yes i called and corrected it when I received that letter. sorry for not documenting that I had called to check his status and that is when she told me about  the birthday.  he was approved on 08/22/2023 the letter was from 08/20/2023"    Did you verify in [employer] System? Just checking because it doesn't look like we have received medication yet. I know it can take Nov[employer] awhile to ship, I just don't want it to be delayed further if they are waiting for DOB verification.    Yes ma'am, I understand your concern, but I did correct and verified with [employer].  I am not sure about the shipment and its delay.  He can also call the get the shipping and tracking information. it take Novo 10-14 days from the date of 09/13 so he should be getting something soon.  Thanks!  I called [employer] and I was not able to get a tracking number. Was forwarded to representative and was told that Rybelsus has not been filled yet because they needed to verify address of the practice. The most recent Rx update fro Rybelsus had [address]. I corrected for [address]. Was told that Rybelsus would be mailed and should arrive in 10 to 14 business days. Patient confirmed he has about 4 weeks of Rybelsus on hand.

## 2023-09-08 ENCOUNTER — Ambulatory Visit: Payer: Self-pay | Admitting: Licensed Clinical Social Worker

## 2023-09-08 NOTE — Patient Outreach (Signed)
Care Coordination   Follow Up Visit Note   09/08/2023 Name: [name] MRN: [id number] DOB: 10-14-1931  [name] is a 89 y.o. year old male who sees [name], [name], MD for primary care. I spoke with  [name] by phone today.  What matters to the patients health and wellness today? Patient has pain issues in his left knee    Goals Addressed             This Visit's Progress    Patient Stated he is having pain issues in his left knee       Interventions:  LCSW spoke with client  via phone today to discuss client current status and needs. Client said he is doing well. He goes to gym 3 times per week to exercise. He likes to play golf occasionally Discussed program support with client. Discussed program support with RN, LCSW, Pharmacist Discussed medication procurement of client. Discussed client support. He said he has support from his girlfriend Discussed transport situation. Client drives himself to and from  medical appointments. Client drives in community as needed to complete errands. Client is not having any transport issues Discussed ambulation of client. He said he is walking well. He likes to pay golf for exercise Discussed ADLs completion of client. He said he completes ADLs independently Encouraged client to utilize program support as needed.  [name] for speaking via phone today with LCSW Encouraged [name] to call LCSW as needed for SW support at [phone]        SDOH assessments and interventions completed:  Yes  SDOH Interventions Today    Flowsheet Row Most Recent Value  SDOH Interventions   Physical Activity Interventions Other (Comments)  [goes to gym to exercise 3 times per week  Likes to play golf occasionally]  Stress Interventions Other (Comment)  [has some stress in managing medical needs]        Care Coordination Interventions:  Yes, provided   Interventions Today    Flowsheet Row Most Recent Value  Chronic Disease   Chronic disease  during today's visit Other  [spoke with client about client needs]  General Interventions   General Interventions Discussed/Reviewed General Interventions Discussed, Community Resources  Exercise Interventions   Exercise Discussed/Reviewed Physical Activity  [likes to exercise at gym. goes to gym to exercise 3 times per week]  Physical Activity Discussed/Reviewed Physical Activity Discussed  Education Interventions   Education Provided Provided Education  Provided Verbal Education On Community Resources  Mental Health Interventions   Mental Health Discussed/Reviewed Coping Strategies  [no mood issues noted]  Nutrition Interventions   Nutrition Discussed/Reviewed Nutrition Discussed  Pharmacy Interventions   Pharmacy Dicussed/Reviewed Pharmacy Topics Discussed        Follow up plan: Follow up call scheduled for 10/28/23 at 3:30 PM     Encounter Outcome:  Patient Visit Completed   [name].Clarisse Rodriges MSW, LCSW Licensed Visual merchandiser [facility] Care Management [phone]

## 2023-09-08 NOTE — Patient Instructions (Signed)
Visit Information  Thank you for taking time to visit with me today. Please don't hesitate to contact me if I can be of assistance to you.   Following are the goals we discussed today:   Goals Addressed             This Visit's Progress    Patient Stated he is having pain issues in his left knee       Interventions:  LCSW spoke with client  via phone today to discuss client current status and needs. Client said he is doing well. He goes to gym 3 times per week to exercise. He likes to play golf occasionally Discussed program support with client. Discussed program support with RN, LCSW, Pharmacist Discussed medication procurement of client. Discussed client support. He said he has support from his girlfriend Discussed transport situation. Client drives himself to and from  medical appointments. Client drives in community as needed to complete errands. Client is not having any transport issues Discussed ambulation of client. He said he is walking well. He likes to pay golf for exercise Discussed ADLs completion of client. He said he completes ADLs independently Encouraged client to utilize program support as needed.  Guy Sandifer for speaking via phone today with LCSW Encouraged [place] to call LCSW as needed for SW support at [phone]        Our next appointment is by telephone on 10/28/23 at 3:30 PM   Please call the care guide team at [phone] if you need to cancel or reschedule your appointment.   If you are experiencing a Mental Health or Behavioral Health Crisis or need someone to talk to, please go to [facility] [address], [place] [phone])   The patient verbalized understanding of instructions, educational materials, and care plan provided today and DECLINED offer to receive copy of patient instructions, educational materials, and care plan.   The patient has been provided with contact information for the care  management team and has been advised to call with any health related questions or concerns.   [name].Odessia Asleson MSW, LCSW Licensed Visual merchandiser [facility] Care Management [phone]

## 2023-09-17 ENCOUNTER — Ambulatory Visit: Payer: Medicare Other | Admitting: Pharmacist

## 2023-09-17 ENCOUNTER — Telehealth: Payer: Self-pay

## 2023-09-17 DIAGNOSIS — E1159 Type 2 diabetes mellitus with other circulatory complications: Secondary | ICD-10-CM

## 2023-09-17 NOTE — Telephone Encounter (Signed)
LMOM informing Pt that Rybelsus has arrived. Placed at front desk for pick up at his convenience.

## 2023-09-17 NOTE — Progress Notes (Signed)
09/17/2023 Name: [name] MRN: [id number] DOB: 05/16/1931  Chief Complaint  Patient presents with   Diabetes   Medication Management    [name] is a 89 y.o. year old male who presented for a telephone visit.   They were referred to the pharmacist by their PCP for assistance in managing medication access.   Subjective:  Medication Access/Adherence  Current Pharmacy:  [facility] - [place], [place] - [address] [place] [address] Phone: [phone] Fax: [phone]  [id number] - [place], [place] - [address] AT [facility] [address] [place] [address] Phone: [phone] Fax: [phone]   Patient reports affordability concerns with their medications: Yes  - has reached Medicare Coverage gap and cost of Eliquis and Rybelsus is high.  We were able to get Rybelsus from medication assistance program but not Eliquis (has not spent $600 out of pocket yet) Patient reports access/transportation concerns to their pharmacy: No  Patient reports adherence concerns with their medications:  No     Diabetes:  Current medications: Rybelsus 7mg  daily  Previously tried [place] but stopped due to balanitis and rash. No metformin due to CKD (last eGFR was 40)  Current glucose readings: hasn't been checking blood glucose recently  Patient denies hypoglycemic s/sx including no dizziness, shakiness, sweating. Patient denies hyperglycemic symptoms including no polyuria, polydipsia, polyphagia, nocturia, neuropathy, blurred vision.   Hypertension:  Current medications: metoprolol ER 25mg  - take 0.5 tablet daily and benazepril 20mg  daily   BP Readings from Last 3 Encounters:  08/12/23 130/66  08/06/23 (!) 152/68  05/06/23 132/62    Patient denies hypotensive s/sx including no dizziness, lightheadedness.  Patient denies hypertensive symptoms including no headache, chest pain, shortness of  breath   Hyperlipidemia/ASCVD Risk Reduction  Current lipid lowering medications: atorvastatin 40mg  daily    Atrial Fibrillation:  Current medications: Rate Control: metoprolol Rhythm Control:  Anticoagulation Regimen: Eliquis 2.5mg  twice a day  Age = 89 yo; weight = 95 kg; Scr = 1.51 Eliquis dose adjusted based on age > 60 and Scr > 1.5 (has been higher in past)    Objective:  Lab Results  Component Value Date   HGBA1C 6.5 08/12/2023    Lab Results  Component Value Date   CREATININE 1.85 (H) 08/12/2023   BUN 23 08/12/2023   NA 140 08/12/2023   K 4.4 08/12/2023   CL 104 08/12/2023   CO2 27 08/12/2023    Lab Results  Component Value Date   CHOL 127 12/11/2022   HDL 31.70 (L) 12/11/2022   LDLCALC 47 07/01/2022   LDLDIRECT 47.0 12/11/2022   TRIG 282.0 (H) 12/11/2022   CHOLHDL 4 12/11/2022    Medications Reviewed Today     Reviewed by Henrene Pastor, RPH-CPP (Pharmacist) on 09/17/23 at 1437  Med List Status: <None>   Medication Order Taking? Sig Documenting Provider Last Dose Status Informant  acetaminophen (TYLENOL) 650 MG CR tablet [id number] Yes Take 1 tablet (650 mg total) by mouth in the morning and at bedtime. Monica Becton, MD Taking Active   apixaban (ELIQUIS) 2.5 MG TABS tablet [id number] Yes TAKE 1 TABLET(2.5 MG) BY MOUTH TWICE DAILY Jens Som Madolyn Frieze, MD Taking Active   atorvastatin (LIPITOR) 40 MG tablet [id number] Yes TAKE 1 TABLET(40 MG) BY MOUTH DAILY Jens Som, Madolyn Frieze, MD Taking Active   benazepril (LOTENSIN) 20 MG tablet [id number] Yes Take 1 tablet (20 mg total) by mouth  daily. Wanda Plump, MD Taking Active   clotrimazole-betamethasone Thurmond Butts) cream [id number] Yes Apply 1 Application topically 2 (two) times daily. Wanda Plump, MD Taking Active   Coenzyme Q10 (COQ10) 100 MG CAPS [id number] Yes Take 1 capsule by mouth daily.  [provider] Taking Active Self  furosemide (LASIX) 40 MG tablet [id number] Yes Take 1 tablet (40 mg total)  by mouth daily. Wanda Plump, MD Taking Active   Melatonin 10 MG TABS [id number] Yes Take 10 mg by mouth at bedtime.  [provider] Taking Active Self  metoprolol succinate (TOPROL-XL) 25 MG 24 hr tablet [id number] Yes Take 1 tablet (25 mg total) by mouth daily. Take with or immediately following a meal Wanda Plump, MD Taking Active   Multiple Vitamin (MULTIVITAMIN WITH MINERALS) TABS tablet [id number] Yes Take 1 tablet by mouth daily. [provider] Taking Active Self  Semaglutide (RYBELSUS) 7 MG TABS [id number] Yes Take 1 tablet (7 mg total) by mouth daily. Wanda Plump, MD Taking Active   tamsulosin [facility]) 0.4 MG CAPS capsule [id number] Yes Take 1 capsule (0.4 mg total) by mouth daily after supper. Wanda Plump, MD Taking Active               Assessment/Plan:   Diabetes: A1c at goal of < 7.0%: - Recommend to continue Rybelsus 7mg  daily. Patient notified that there is a medication assistance program delivery of Rybelsus he can pick up in our office. - Printed application for [employer] medication assistance program for 2025. Patient will come into office to sign and pick up Rybelsus Monday 10/18.  - Recommend to check glucose 2 to 3 times per week.  - Reviewed home blood glucose goals  Fasting blood glucose goal (before meals) = 80 to 130 Blood glucose goal after a meal = less than 180   - Meets financial criteria for Rybelsus patient assistance program through [employer] thru 12/09/2023  Hypertension: blood pressure at goal - Reviewed long term cardiovascular and renal outcomes of uncontrolled blood pressure - Recommend to continue metoprolol and benazepril  Hyperlipidemia/ASCVD Risk Reduction:LDL at goal of < 55 (CAD + Type 2 DM) - Reviewed long term complications of uncontrolled cholesterol - Recommend to atorvastatin 40mg  daily    Afib: controlled - continue current dose of Eliquis 2.5mg  twice a day and metoprolol 12.5mg  daily - Screened for medication  assistance program at last visit but unfortunately patient did NOT meet financial criteria for Eliquis or for LIS. (He also has not spent $600 out of pocket for 2024)  Follow Up Plan: 2 to 3 months to check on medication assistance program for 2025.   Henrene Pastor, PharmD Clinical Pharmacist [place] [facility]

## 2023-09-18 DIAGNOSIS — C44622 Squamous cell carcinoma of skin of right upper limb, including shoulder: Secondary | ICD-10-CM | POA: Diagnosis not present

## 2023-09-24 ENCOUNTER — Ambulatory Visit: Payer: Medicare Other

## 2023-10-06 DIAGNOSIS — C44622 Squamous cell carcinoma of skin of right upper limb, including shoulder: Secondary | ICD-10-CM | POA: Diagnosis not present

## 2023-10-28 ENCOUNTER — Ambulatory Visit: Payer: Self-pay | Admitting: Licensed Clinical Social Worker

## 2023-10-28 NOTE — Patient Outreach (Signed)
  Care Coordination   10/28/2023 Name: [name] MRN: [id number] DOB: [birth year]   Care Coordination Outreach Attempts:  An unsuccessful telephone outreach was attempted today to offer the patient information about available care coordination services.  Follow Up Plan:  Additional outreach attempts will be made to offer the patient care coordination information and services.   Encounter Outcome:  No Answer   Care Coordination Interventions:  No, not indicated    [name].Gisela Lea MSW, LCSW Licensed Visual merchandiser [facility] Care Management [phone]

## 2023-11-09 ENCOUNTER — Telehealth: Payer: Self-pay | Admitting: Physician Assistant

## 2023-11-09 NOTE — Telephone Encounter (Signed)
Patient has upcoming Mohs surgery on Tuesday and wondering if he should come off of Eliquis. I advised him to contact his dermatology specialist tomorrow morning to see if they need to have him come off of Eliquis or not. During the mean time, will forward to our clinical pharmacist to review. Overall, from cardiac perspective, this is a low risk procedure which he should be able to proceed. Just need figure out if he need to hold Eliquis.

## 2023-11-10 NOTE — Telephone Encounter (Signed)
It's been my observation that most dermatologists don't stop anticoagulation for this procedure.

## 2023-11-11 DIAGNOSIS — C44622 Squamous cell carcinoma of skin of right upper limb, including shoulder: Secondary | ICD-10-CM | POA: Diagnosis not present

## 2023-11-17 DIAGNOSIS — L905 Scar conditions and fibrosis of skin: Secondary | ICD-10-CM | POA: Diagnosis not present

## 2023-11-17 DIAGNOSIS — L089 Local infection of the skin and subcutaneous tissue, unspecified: Secondary | ICD-10-CM | POA: Diagnosis not present

## 2023-11-18 DIAGNOSIS — C7989 Secondary malignant neoplasm of other specified sites: Secondary | ICD-10-CM | POA: Diagnosis not present

## 2023-11-20 ENCOUNTER — Other Ambulatory Visit: Payer: Medicare Other | Admitting: Pharmacist

## 2023-11-20 NOTE — Progress Notes (Signed)
11/20/2023 Name: [name] MRN: [id number] DOB: 12/04/1931  Chief Complaint  Patient presents with   Medication Management    [name] is a 89 y.o. year old male. Called to check on medication assistance program application for 2025.   They were referred to the pharmacist by their PCP for assistance in managing medication access.   Subjective:  Medication Access/Adherence  Current Pharmacy:  [facility] #[id number] - [place], [place] - [address] AT [facility] [address] [place] [address] Phone: [phone] Fax: [phone]   Patient reports affordability concerns with their medications: Yes  - has reached Medicare Coverage gap and cost of Eliquis and Rybelsus is high.  We were able to get Rybelsus from medication assistance program but not Eliquis (has not spent $600 out of pocket yet). Approved to receive Rybelsus thru 12/09/2023 Patient reports access/transportation concerns to their pharmacy: No  Patient reports adherence concerns with their medications:  No     Diabetes:  Current medications: Rybelsus 7mg  daily  Previously tried [place] but stopped due to balanitis and rash. No metformin due to CKD (last eGFR was 40) .   Hypertension:  Current medications: metoprolol ER 25mg  - take 0.5 tablet daily and benazepril 20mg  daily   BP Readings from Last 3 Encounters:  08/12/23 130/66  08/06/23 (!) 152/68  05/06/23 132/62    Patient denies hypotensive s/sx including no dizziness, lightheadedness.  Patient denies hypertensive symptoms including no headache, chest pain, shortness of breath   Hyperlipidemia/ASCVD Risk Reduction  Current lipid lowering medications: atorvastatin 40mg  daily    Atrial Fibrillation:  Current medications: Rate Control: metoprolol Rhythm Control:  Anticoagulation Regimen: Eliquis 2.5mg  twice a day - no refill showing in EPIC for several months but patient states he just had refilled last  week and has been taking regularly.   Age = 89 yo; weight = 95 kg; Scr = 1.85 Eliquis dose adjusted based on age > 55 and Scr > 1.5    Objective:  Lab Results  Component Value Date   HGBA1C 6.5 08/12/2023    Lab Results  Component Value Date   CREATININE 1.85 (H) 08/12/2023   BUN 23 08/12/2023   NA 140 08/12/2023   K 4.4 08/12/2023   CL 104 08/12/2023   CO2 27 08/12/2023    Lab Results  Component Value Date   CHOL 127 12/11/2022   HDL 31.70 (L) 12/11/2022   LDLCALC 47 07/01/2022   LDLDIRECT 47.0 12/11/2022   TRIG 282.0 (H) 12/11/2022   CHOLHDL 4 12/11/2022    Medications Reviewed Today     Reviewed by Henrene Pastor, RPH-CPP (Pharmacist) on 11/20/23 at 1333  Med List Status: <None>   Medication Order Taking? Sig Documenting Provider Last Dose Status Informant  acetaminophen (TYLENOL) 650 MG CR tablet [id number] No Take 1 tablet (650 mg total) by mouth in the morning and at bedtime. Monica Becton, MD Taking Active   apixaban [facility]) 2.5 MG TABS tablet [id number] No TAKE 1 TABLET(2.5 MG) BY MOUTH TWICE DAILY Jens Som Madolyn Frieze, MD Taking Active   atorvastatin (LIPITOR) 40 MG tablet [id number] No TAKE 1 TABLET(40 MG) BY MOUTH DAILY Crenshaw, Madolyn Frieze, MD Taking Active   benazepril (LOTENSIN) 20 MG tablet [id number] No Take 1 tablet (20 mg total) by mouth daily. Wanda Plump, MD Taking Active   clotrimazole-betamethasone Thurmond Butts) cream [id number] No Apply 1 Application topically 2 (two) times daily. Wanda Plump, MD  Taking Active   Coenzyme Q10 (COQ10) 100 MG CAPS [id number] No Take 1 capsule by mouth daily.  [provider] Taking Active Self  furosemide (LASIX) 40 MG tablet [id number] No Take 1 tablet (40 mg total) by mouth daily. Wanda Plump, MD Taking Active   Melatonin 10 MG TABS [id number] No Take 10 mg by mouth at bedtime.  [provider] Taking Active Self  metoprolol succinate (TOPROL-XL) 25 MG 24 hr tablet [id number] No Take 1 tablet (25 mg  total) by mouth daily. Take with or immediately following a meal Paz, [name], MD Taking Active   Multiple Vitamin (MULTIVITAMIN WITH MINERALS) TABS tablet [id number] No Take 1 tablet by mouth daily. [provider] Taking Active Self  Semaglutide (RYBELSUS) 7 MG TABS [id number] No Take 1 tablet (7 mg total) by mouth daily. Wanda Plump, MD Taking Active   tamsulosin [facility]) 0.4 MG CAPS capsule [id number] No Take 1 capsule (0.4 mg total) by mouth daily after supper. Wanda Plump, MD Taking Active               Assessment/Plan:   Diabetes: A1c at goal of < 7.0%: - Recommend to continue Rybelsus 7mg  daily.  - [employer] to check on 2025 patient assistance program. They needed additional information - address of his insurance company. Provided info. Representative states we should receive fax with decision in 24 to 48 hours. Patient notified.   Hypertension: blood pressure at goal - Reviewed long term cardiovascular and renal outcomes of uncontrolled blood pressure - Recommend to continue metoprolol and benazepril  Hyperlipidemia/ASCVD Risk Reduction:LDL at goal of < 55 (CAD + Type 2 DM) - Reviewed long term complications of uncontrolled cholesterol - Recommend to atorvastatin 40mg  daily    Afib: controlled - continue current dose of Eliquis 2.5mg  twice a day and metoprolol 12.5mg  daily   Follow Up Plan: 1 to 2 months to check on medication assistance program for 2025.   Henrene Pastor, PharmD Clinical Pharmacist [place] [facility]

## 2023-11-24 ENCOUNTER — Ambulatory Visit: Payer: Self-pay | Admitting: Licensed Clinical Social Worker

## 2023-11-24 DIAGNOSIS — L578 Other skin changes due to chronic exposure to nonionizing radiation: Secondary | ICD-10-CM | POA: Diagnosis not present

## 2023-11-24 DIAGNOSIS — Z859 Personal history of malignant neoplasm, unspecified: Secondary | ICD-10-CM | POA: Diagnosis not present

## 2023-11-24 DIAGNOSIS — L570 Actinic keratosis: Secondary | ICD-10-CM | POA: Diagnosis not present

## 2023-11-24 DIAGNOSIS — W908XXS Exposure to other nonionizing radiation, sequela: Secondary | ICD-10-CM | POA: Diagnosis not present

## 2023-11-24 DIAGNOSIS — Z86008 Personal history of in-situ neoplasm of other site: Secondary | ICD-10-CM | POA: Diagnosis not present

## 2023-11-24 DIAGNOSIS — C44622 Squamous cell carcinoma of skin of right upper limb, including shoulder: Secondary | ICD-10-CM | POA: Diagnosis not present

## 2023-11-24 NOTE — Patient Outreach (Signed)
  Care Coordination   Follow Up Visit Note   11/24/2023 Name: [name] MRN: [id number] DOB: [birth year]  [name] is a 89 y.o. year old male who sees [name], Clayton Rod, MD for primary care. I spoke with  [name] by phone today.  What matters to the patients health and wellness today?  Patient stated he has occasional pain issues    Goals Addressed             This Visit's Progress    Patient Stated he has occasional pain issues       Interventions: Spoke with client via phone today about client needs [name] said he has occasional pain issues. He does try to go to gym to exercise 3 times weekly. He also enjoys playing golf when he is able to do so Discussed transport needs. Client drives himself to appointments and to complete errands as needed Discussed medication procurement Discussed program support with RN, LCSW, Pharmacist Discussed support with PCP, Dr. Willow [name]. Client said he has appointment with Dr. Drue [name] later this month (December, 2024) Discussed vision of client. He said he is doing well with vision. He said he uses reading glasses occasionally to help read small print Thanked client for phone call with LCSW today Reminded client that he could call LCSW for SW support as needed at [phone] Client was appreciative of call from LCSW today        SDOH assessments and interventions completed:  Yes  SDOH Interventions Today    Flowsheet Row Most Recent Value  SDOH Interventions   Physical Activity Interventions Other (Comments)  [goes to gym 3 times per week to exercise]  Stress Interventions Other (Comment)  [has occasional stress in managing medical needs]        Care Coordination Interventions:  Yes, provided    Interventions Today    Flowsheet Row Most Recent Value  Chronic Disease   Chronic disease during today's visit Other  [spoke with client about client needs]  General Interventions   General Interventions Discussed/Reviewed General  Interventions Discussed, Community Resources  Education Interventions   Education Provided Provided Education  Provided Engineer, [employer] On [employer]  Mental Health Interventions   Mental Health Discussed/Reviewed Coping Strategies  [likes to exercise as he is able . No mood issues noted]  Nutrition Interventions   Nutrition Discussed/Reviewed Nutrition Discussed  Pharmacy Interventions   Pharmacy Dicussed/Reviewed Pharmacy Topics Discussed       Follow up plan: Follow up call scheduled for 01/19/24 at 1:00 PM     Encounter Outcome:  Patient Visit Completed   [name].Thayden Lemire MSW, LCSW Licensed Visual merchandiser [facility] Care Management [phone]

## 2023-11-24 NOTE — Patient Instructions (Signed)
Visit Information  Thank you for taking time to visit with me today. Please don't hesitate to contact me if I can be of assistance to you.   Following are the goals we discussed today:   Goals Addressed             This Visit's Progress    Patient Stated he has occasional pain issues       Interventions: Spoke with client via phone today about client needs [name] said he has occasional pain issues. He does try to go to gym to exercise 3 times weekly. He also enjoys playing golf when he is able to do so Discussed transport needs. Client drives himself to appointments and to complete errands as needed Discussed medication procurement Discussed program support with RN, LCSW, Pharmacist Discussed support with PCP, Dr. [name]. Client said he has appointment with Dr. [name] later this month (December, 2024) Discussed vision of client. He said he is doing well with vision. He said he uses reading glasses occasionally to help read small print Thanked client for phone call with LCSW today Reminded client that he could call LCSW for SW support as needed at [phone] Client was appreciative of call from LCSW today        Our next appointment is by telephone on 01/19/24 at 1:00 PM   Please call the care guide team at [phone] if you need to cancel or reschedule your appointment.   If you are experiencing a Mental Health or Behavioral Health Crisis or need someone to talk to, please go to [facility] [address] [place] [phone])   The patient verbalized understanding of instructions, educational materials, and care plan provided today and DECLINED offer to receive copy of patient instructions, educational materials, and care plan.   The patient has been provided with contact information for the care management team and has been advised to call with any health related questions or concerns.   [name].[name] MSW, LCSW Licensed Information systems manager [facility] Care Management [phone]

## 2023-11-25 ENCOUNTER — Other Ambulatory Visit: Payer: Self-pay | Admitting: Internal Medicine

## 2023-12-08 ENCOUNTER — Encounter: Payer: Self-pay | Admitting: Internal Medicine

## 2023-12-09 ENCOUNTER — Encounter: Payer: Self-pay | Admitting: Internal Medicine

## 2023-12-09 ENCOUNTER — Ambulatory Visit: Payer: Medicare Other | Admitting: Internal Medicine

## 2023-12-09 VITALS — BP 134/70 | HR 53 | Temp 98.0°F | Resp 16 | Ht 71.0 in | Wt 213.5 lb

## 2023-12-09 DIAGNOSIS — N1832 Chronic kidney disease, stage 3b: Secondary | ICD-10-CM

## 2023-12-09 DIAGNOSIS — E7800 Pure hypercholesterolemia, unspecified: Secondary | ICD-10-CM | POA: Diagnosis not present

## 2023-12-09 DIAGNOSIS — I10 Essential (primary) hypertension: Secondary | ICD-10-CM | POA: Diagnosis not present

## 2023-12-09 DIAGNOSIS — E1159 Type 2 diabetes mellitus with other circulatory complications: Secondary | ICD-10-CM

## 2023-12-09 LAB — BASIC METABOLIC PANEL
BUN: 23 mg/dL (ref 6–23)
CO2: 30 meq/L (ref 19–32)
Calcium: 9.7 mg/dL (ref 8.4–10.5)
Chloride: 104 meq/L (ref 96–112)
Creatinine, Ser: 1.63 mg/dL — ABNORMAL HIGH (ref 0.40–1.50)
GFR: 36.98 mL/min — ABNORMAL LOW (ref >60.00)
Glucose, Bld: 141 mg/dL — ABNORMAL HIGH (ref 70–99)
Potassium: 4.6 meq/L (ref 3.5–5.1)
Sodium: 141 meq/L (ref 135–145)

## 2023-12-09 LAB — LIPID PANEL
Cholesterol: 141 mg/dL (ref 0–200)
HDL: 36.6 mg/dL — ABNORMAL LOW (ref >39.00)
LDL Cholesterol: 48 mg/dL (ref 0–99)
NonHDL: 104.09
Total CHOL/HDL Ratio: 4
Triglycerides: 279 mg/dL — ABNORMAL HIGH (ref 0.0–149.0)
VLDL: 55.8 mg/dL — ABNORMAL HIGH (ref 0.0–40.0)

## 2023-12-09 LAB — HEMOGLOBIN A1C: Hgb A1c MFr Bld: 7.2 % — ABNORMAL HIGH (ref 4.6–6.5)

## 2023-12-09 LAB — AST: AST: 22 U/L (ref 0–37)

## 2023-12-09 LAB — ALT: ALT: 18 U/L (ref 0–53)

## 2023-12-09 NOTE — Patient Instructions (Signed)
  Please consider getting a COVID-vaccine   For occasional constipation:   Colace 100 mg once daily (over-the-counter) You could use a glycerin suppository if needed Avoid milk of magnesium  If severe constipation, nausea vomiting: Seek medical attention   Continue checking your blood regularly Blood pressure goal:  between 110/65 and  135/85. If it is consistently higher or lower, let me know     GO TO THE LAB : Get the blood work     Next visit with me in 3 to 4 months for a physical exam Please schedule it at the front desk

## 2023-12-09 NOTE — Progress Notes (Signed)
 Subjective:    Patient ID: [name], male    DOB: 12/19/1930, 89 y.o.   MRN: [id number]  DOS:  12/09/2023 Type of visit - description: Routine checkup  Occasional urinary frequency, at baseline, no blood in the urine. On Eliquis , from time to time see red drops of fresh blood in the toilet paper mostly when he has to strain for a BM. No ambulatory CBGs but good compliance with medication. Ambulatory BPs typically 130/70. Denies chest pain or difficulty breathing.  No palpitations.   Review of Systems See above   Past Medical History:  Diagnosis Date   Anemia    Antral gastritis 2013   EGD    Atrial fibrillation (HCC)    CAD (coronary artery disease)    s/p CABG 1993 (L-LAD, S-RI, S-D1);   echo 1/10: EF 55%;    myoview  12/09: inf MI, no ischemia   Candida esophagitis (HCC) 2013   EGD    Cataracts, bilateral    Family history of malignant neoplasm of gastrointestinal tract    Folliculitis    Hiatal hernia    HTN (hypertension)    Hyperlipidemia    Irritable bladder    Myocardial infarction [facility])    Obesity    Osteoarthritis     Past Surgical History:  Procedure Laterality Date   BLEPHAROPLASTY Bilateral 11/16/2021   upper lid   CATARACT EXTRACTION Right 07/21/2018   CHOLECYSTECTOMY     laparoscopic '92   CORONARY ARTERY BYPASS GRAFT     graft '92: LIMA-LAD, SVG - D2,R1, D1   ENTEROSCOPY  08/22/2012   Procedure: ENTEROSCOPY;  Surgeon: Renaye Sous, MD;  Location: [facility] END[facility]COPY;  Service: Endoscopy;  Laterality: N/A;   INTRAOCULAR LENS EXCHANGE Right 07/21/2018   KNEE SURGERY     MOLE REMOVAL  2001 and 2008   PAROTIDECTOMY Right 06/18/2021   PILONIDAL CYST EXCISION      Current Outpatient Medications  Medication Instructions   acetaminophen  (TYLENOL ) 650 mg, Oral, 2 times daily   apixaban  (ELIQUIS ) 2.5 MG TABS tablet TAKE 1 TABLET(2.5 MG) BY MOUTH TWICE DAILY   atorvastatin  (LIPITOR) 40 MG tablet TAKE 1 TABLET(40 MG) BY MOUTH DAILY   benazepril  (LOTENSIN )  20 mg, Oral, Daily   clotrimazole -betamethasone  (LOTRISONE ) cream 1 Application, Topical, 2 times daily   Coenzyme Q10 (COQ10) 100 MG CAPS 1 capsule, Daily   furosemide  (LASIX ) 40 mg, Oral, Daily   Melatonin 10 mg, Daily at bedtime   metoprolol  succinate (TOPROL -XL) 25 mg, Oral, Daily, Take with or immediately following a meal   Multiple Vitamin (MULTIVITAMIN WITH MINERALS) TABS tablet 1 tablet, Daily   Rybelsus  7 mg, Oral, Daily   tamsulosin  (FLOMAX ) 0.4 mg, Oral, Daily after supper       Objective:   Physical Exam BP 134/70   Pulse (!) 53   Temp 98 F (36.7 C) (Oral)   Resp 16   Ht 5' 11 (1.803 m)   Wt 213 lb 8 oz (96.8 kg)   SpO2 97%   BMI 29.78 kg/m  General:   Well developed, NAD, BMI noted. HEENT:  Normocephalic . Face symmetric, atraumatic Lungs:  CTA B Normal respiratory effort, no intercostal retractions, no accessory muscle use. Heart: Irregularly irregular  lower extremities: Minimal swelling at the right pedal area without redness or warmness.  Otherwise no lower extremity edema, calves soft and symmetric. Skin: Not pale. Not jaundice Neurologic:  alert & oriented X3.  Speech normal, gait appropriate for age and unassisted Psych--  Cognition and judgment appear intact.  Cooperative with normal attention span and concentration.  Behavior appropriate. No anxious or depressed appearing.      Assessment    ASSESSMENT  DM:+ mild neuropathy.  avoiding Metformin -CKD. Avoiding Actos d/t h/o  CHF.  ; Farxiga  12-2022 (balanitis) HTN Hyperlipidemia CKD: Chronic, CT abdomen 2021: No renal obstruction, bilateral cortical thinning. CV: --CAD, CABG 1993, Myoview  2009 no ischemia. --Atrial fibrillation -- rate control, xarelto  -- chronic combined systolic/diastolic congestive heart failure Venous insufficiency, mild LE edema L>>R  LUTS    MSK: DJD, spinal stenosis, R wrist Fx in the 80s GI: --Recurrent melena: Work-up 2013 Dr. Jakie: Colonoscopy, EGD,  capsule endoscopy.  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 On Rybelsus  only, it has caused some constipation.  Check A1c. Mild constipation: Taking milk of magnesia, history of CKD, recommend to avoid it.  Okay to take Colace and occasionally glycerin suppositories.  See AVS. HTN: BP looks good, ambulatory BPs also in the 130s.  Continue Lotensin , Lasix , metoprolol .  Check BMP High cholesterol: On atorvastatin  40 mg, checking FLP AST ALT CKD: Chronic, on Lotensin . Last creatinine 1.85, not far from baseline, last imaging CT abdomen 2021 > no obstruction.  Does not see nephrology, check BMP. A-fib: Anticoagulated  w/o apparent complications, no symptoms. Preventive care: Had a flu shot, benefits of COVID-vaccine discussed and recommended. RTC 3 to 4 months CPX

## 2023-12-09 NOTE — Assessment & Plan Note (Signed)
 DM: Last A1c 6.5. On Rybelsus  only, it has caused some constipation.  Check A1c. Mild constipation: Taking milk of magnesia, history of CKD, recommend to avoid it.  Okay to take Colace and occasionally glycerin suppositories.  See AVS. HTN: BP looks good, ambulatory BPs also in the 130s.  Continue Lotensin , Lasix , metoprolol .  Check BMP High cholesterol: On atorvastatin  40 mg, checking FLP AST ALT CKD: Chronic, on Lotensin . Last creatinine 1.85, not far from baseline, last imaging CT abdomen 2021 > no obstruction.  Does not see nephrology, check BMP. A-fib: Anticoagulated  w/o apparent complications, no symptoms. Preventive care: Had a flu shot, benefits of COVID-vaccine discussed and recommended. RTC 3 to 4 months CPX

## 2023-12-13 ENCOUNTER — Ambulatory Visit: Payer: Medicare Other

## 2023-12-13 ENCOUNTER — Ambulatory Visit
Admission: RE | Admit: 2023-12-13 | Discharge: 2023-12-13 | Disposition: A | Discharge status: Home / Self Care | Payer: Medicare Other | Source: Ambulatory Visit | Attending: Family Medicine | Admitting: Family Medicine

## 2023-12-13 VITALS — BP 147/68 | HR 56 | Temp 97.7°F | Resp 18

## 2023-12-13 DIAGNOSIS — M1711 Unilateral primary osteoarthritis, right knee: Secondary | ICD-10-CM

## 2023-12-13 DIAGNOSIS — M25461 Effusion, right knee: Secondary | ICD-10-CM | POA: Diagnosis not present

## 2023-12-13 DIAGNOSIS — M25561 Pain in right knee: Secondary | ICD-10-CM

## 2023-12-13 NOTE — ED Triage Notes (Signed)
 Patient states that he twisted his right knee yesterday.  No apparent injury.  Patient denies any pain meds.

## 2023-12-13 NOTE — ED Provider Notes (Addendum)
 [name]    CSN: [id number] Arrival date & time: 12/13/23  1018      History   Chief Complaint Chief Complaint  Patient presents with   Knee Pain    HPI [name] is a 89 y.o. male.   Very pleasant 89 year old gentleman.  He is here for right knee pain.  He states that his knee was feeling as good as it ever does, and then he stepped and twisted about and had an immediate pain in his knee.  It has been painful ever since that time.  He has now had pain for 2 days.  He cannot fully bend or straighten the knee.  This knee has known arthritis.  He has had cortisone and Synvisc injections in the knee.  He states he also had arthroscopic surgery for torn meniscus in the past. Patient does have atrial fibrillation and is anticoagulated on Eliquis .  States his other medical problems are stable.    Past Medical History:  Diagnosis Date   Anemia    Antral gastritis 2013   EGD    Atrial fibrillation (HCC)    CAD (coronary artery disease)    s/p CABG 1993 (L-LAD, S-RI, S-D1);   echo 1/10: EF 55%;    myoview  12/09: inf MI, no ischemia   Candida esophagitis (HCC) 2013   EGD    Cataracts, bilateral    Family history of malignant neoplasm of gastrointestinal tract    Folliculitis    Hiatal hernia    HTN (hypertension)    Hyperlipidemia    Irritable bladder    Myocardial infarction [facility])    Obesity    Osteoarthritis     Patient Active Problem List   Diagnosis Date Noted   CKD (chronic kidney disease) stage 3, GFR 30-59 ml/min (HCC) 07/01/2022   Right leg swelling 12/17/2021   Lymphadenopathy, inguinal, right 12/17/2021   Primary osteoarthritis of right knee 07/10/2021   Lumbar spondylosis 04/16/2019   CHF (congestive heart failure) (HCC) 12/17/2016   PCP NOTES >>>>> 10/11/2015   Carpal tunnel syndrome 02/15/2014   Anemia 08/14/2012   Annual physical exam 01/08/2012   Atrial fibrillation (HCC) 03/29/2011   Venous (peripheral) insufficiency 09/04/2009    OBESITY 07/04/2009   DJD (degenerative joint disease) 07/04/2009   SLEEP DISORDER 07/04/2009   Dyspepsia 02/28/2009   DM II (diabetes mellitus, type II), controlled (HCC) 04/25/2008   Hyperlipidemia 06/25/2007   Essential hypertension 06/25/2007   CAD (coronary artery disease) of artery bypass graft 06/25/2007    Past Surgical History:  Procedure Laterality Date   BLEPHAROPLASTY Bilateral 11/16/2021   upper lid   CATARACT EXTRACTION Right 07/21/2018   CHOLECYSTECTOMY     laparoscopic '92   CORONARY ARTERY BYPASS GRAFT     graft '92: LIMA-LAD, SVG - D2,R1, D1   ENTEROSCOPY  08/22/2012   Procedure: ENTEROSCOPY;  Surgeon: Renaye Sous, MD;  Location: [facility];  Service: Endoscopy;  Laterality: N/A;   INTRAOCULAR LENS EXCHANGE Right 07/21/2018   KNEE SURGERY     MOLE REMOVAL  2001 and 2008   PAROTIDECTOMY Right 06/18/2021   PILONIDAL CYST EXCISION         Home Medications    Prior to Admission medications   Medication Sig Start Date End Date Taking? Authorizing Provider  acetaminophen  (TYLENOL ) 650 MG CR tablet Take 1 tablet (650 mg total) by mouth in the morning and at bedtime. 03/18/22  Yes Curtis Debby PARAS, MD  apixaban  (ELIQUIS ) 2.5 MG TABS tablet  TAKE 1 TABLET(2.5 MG) BY MOUTH TWICE DAILY 04/29/23  Yes Pietro Redell RAMAN, MD  atorvastatin  (LIPITOR) 40 MG tablet TAKE 1 TABLET(40 MG) BY MOUTH DAILY 07/02/23  Yes Pietro Redell RAMAN, MD  benazepril  (LOTENSIN ) 20 MG tablet Take 1 tablet (20 mg total) by mouth daily. 07/22/23  Yes Paz, Jose E, MD  clotrimazole -betamethasone  (LOTRISONE ) cream Apply 1 Application topically 2 (two) times daily. 09/04/22  Yes Paz, Jose E, MD  Coenzyme Q10 (COQ10) 100 MG CAPS Take 1 capsule by mouth daily.    Yes [provider]  furosemide  (LASIX ) 40 MG tablet Take 1 tablet (40 mg total) by mouth daily. 06/13/23  Yes Paz, Jose E, MD  Melatonin 10 MG TABS Take 10 mg by mouth at bedtime.    Yes [provider]  metoprolol  succinate  (TOPROL -XL) 25 MG 24 hr tablet Take 1 tablet (25 mg total) by mouth daily. Take with or immediately following a meal 06/10/23  Yes Paz, Jose E, MD  Multiple Vitamin (MULTIVITAMIN WITH MINERALS) TABS tablet Take 1 tablet by mouth daily.   Yes [provider]  Semaglutide  (RYBELSUS ) 7 MG TABS Take 1 tablet (7 mg total) by mouth daily. 09/01/23  Yes Paz, Aloysius BRAVO, MD  tamsulosin  (FLOMAX ) 0.4 MG CAPS capsule Take 1 capsule (0.4 mg total) by mouth daily after supper. 11/25/23  Yes Amon Aloysius BRAVO, MD    Family History Family History  Problem Relation Age of Onset   Coronary artery disease Father        and brother   Heart attack Father        and brother-fatal   Stroke Father    Breast cancer Mother    Alzheimer's disease Mother    Colon cancer Maternal Uncle    Esophageal cancer Neg Hx    Stomach cancer Neg Hx    Rectal cancer Neg Hx    Prostate cancer Neg Hx     Social History Social History   Tobacco Use   Smoking status: Former    Current packs/day: 0.00    Types: Cigarettes    Start date: 08/14/1948    Quit date: 08/14/1964    Years since quitting: 59.3   Smokeless tobacco: Never   Tobacco comments:    quit 1965  Vaping Use   Vaping status: Never Used  Substance Use Topics   Alcohol use: Not Currently    Alcohol/week: 7.0 standard drinks of alcohol    Types: 7 Shots of liquor per week   Drug use: No     Allergies   Patient has no known allergies.   Review of Systems Review of Systems See HPI  Physical Exam Triage Vital Signs ED Triage Vitals  Encounter Vitals Group     BP 12/13/23 1045 (!) 147/68     Systolic BP Percentile --      Diastolic BP Percentile --      Pulse Rate 12/13/23 1045 (!) 56     Resp 12/13/23 1045 18     Temp 12/13/23 1045 97.7 F (36.5 C)     Temp Source 12/13/23 1045 Oral     SpO2 12/13/23 1045 95 %     Weight --      Height --      Head Circumference --      Peak Flow --      Pain Score 12/13/23 1046 6     Pain Loc --       Pain Education --  Exclude from Growth Chart --    No data found.  Updated Vital Signs BP (!) 147/68 (BP Location: Left Arm)   Pulse (!) 56   Temp 97.7 F (36.5 C) (Oral)   Resp 18   SpO2 95%       Physical Exam Constitutional:      General: He is not in acute distress.    Appearance: He is well-developed and normal weight.  HENT:     Head: Normocephalic and atraumatic.  Eyes:     Conjunctiva/sclera: Conjunctivae normal.     Pupils: Pupils are equal, round, and reactive to light.  Cardiovascular:     Rate and Rhythm: Normal rate.  Pulmonary:     Effort: Pulmonary effort is normal. No respiratory distress.  Abdominal:     General: There is no distension.     Palpations: Abdomen is soft.  Musculoskeletal:        General: Tenderness present. No swelling or deformity. Normal range of motion.     Cervical back: Normal range of motion.     Comments: Tenderness diffusely across posterior knee.  No effusion.  Patient lacks full extension and flexion.  No instability identified although exam is difficult secondary to pain and immobility.  Skin:    General: Skin is warm and dry.  Neurological:     Mental Status: He is alert.      UC Treatments / Results  Labs (all labs ordered are listed, but only abnormal results are displayed) Labs Reviewed - No data to display  EKG   Radiology DG Knee Complete 4 Views Right Result Date: 12/13/2023 CLINICAL DATA:  Pain after twisting injury. EXAM: RIGHT KNEE - COMPLETE 4+ VIEW COMPARISON:  07/10/2021 FINDINGS: Trace suprapatellar joint effusion. No sign of acute fracture or dislocation. Tricompartment osteoarthritis and chondrocalcinosis identified. Degenerative changes are most severe within the medial compartment. Vascular calcifications identified within the lower extremity. IMPRESSION: 1. No acute findings. 2. Trace suprapatellar joint effusion. 3. Tricompartment osteoarthritis and chondrocalcinosis. Electronically Signed   By:  Waddell Calk M.D.   On: 12/13/2023 11:47    Procedures Procedures (including critical [name] time)  Medications Ordered in UC Medications - No data to display  Initial Impression / Assessment and Plan / UC Course  I have reviewed the triage vital signs and the nursing notes.  Pertinent labs & imaging results that were available during my [name] of the patient were reviewed by me and considered in my medical decision making (see chart for details).     I discussed with the patient his symptoms and exam findings consistent with a torn cartilage inside his knee.  This may settle down with conservative management.  I recommend ice and rest.  I discussed an injection patient declines.  Follow-up with Dr. Curtis Final Clinical Impressions(s) / UC Diagnoses   Final diagnoses:  Mechanical knee pain, right  Primary osteoarthritis of right knee     Discharge Instructions      Take Tylenol  for pain Continue ice for 20 minutes 3-4 times a day Limit walking while your knee is painful See Dr. Curtis if not improving by Monday     ED Prescriptions   None    PDMP not reviewed this encounter.   Maranda Jamee Jacob, MD 12/13/23 1242    Maranda Jamee Jacob, MD 12/13/23 [phone]

## 2023-12-13 NOTE — Discharge Instructions (Signed)
 Take Tylenol for pain Continue ice for 20 minutes 3-4 times a day Limit walking while your knee is painful See Dr. Benjamin Stain if not improving by Monday

## 2023-12-15 ENCOUNTER — Encounter: Payer: Self-pay | Admitting: Pharmacist

## 2023-12-15 ENCOUNTER — Telehealth: Payer: Self-pay | Admitting: Internal Medicine

## 2023-12-15 NOTE — Progress Notes (Signed)
 12/15/2023 Name: [name] MRN: [id number] DOB: 1931-03-22  Chief Complaint  Patient presents with   Medication Management   Diabetes    [name] is a 89 y.o. year old male.     They were referred to the pharmacist by their PCP for assistance in managing medication access.  Our office received a fax from [employer] medication assistance program that patient's enrollment ended 12/09/2023 but we have submitted 2025 application already. Fax from [employer] also requested a new prescription but this also would have been included with 2025 application.   Subjective:  Medication Access/Adherence  Current Pharmacy:  [id number] - [place], [place] - [address] AT [facility] [address] [place] [address] Phone: [phone] Fax: [phone]   Patient reports affordability concerns with their medications: Yes  - He usually reaches Medicare Coverage gap and cost of Eliquis  and Rybelsus  is high.  We were able to get Rybelsus  from medication assistance program but not Eliquis  (has not spent $600 out of pocket yet).  Patient reports access/transportation concerns to their pharmacy: No  Patient reports adherence concerns with their medications:  No     Diabetes:  Current medications: Rybelsus  7mg  daily  Previously tried Farxiga  but stopped due to balanitis and rash. No metformin due to CKD (last eGFR was 40)  Atrial Fibrillation:  Current medications: Rate Control: metoprolol  Rhythm Control:  Anticoagulation Regimen: Eliquis  2.5mg  twice a day   Age = 89 yo; weight = 95 kg; Scr = 1.85 Eliquis  dose adjusted based on age > 80 and Scr > 1.5    Objective:  Lab Results  Component Value Date   HGBA1C 7.2 (H) 12/09/2023    Lab Results  Component Value Date   CREATININE 1.63 (H) 12/09/2023   BUN 23 12/09/2023   NA 141 12/09/2023   K 4.6 12/09/2023   CL 104 12/09/2023   CO2 30 12/09/2023    Lab Results  Component Value  Date   CHOL 141 12/09/2023   HDL 36.60 (L) 12/09/2023   LDLCALC 48 12/09/2023   LDLDIRECT 47.0 12/11/2022   TRIG 279.0 (H) 12/09/2023   CHOLHDL 4 12/09/2023    Medications Reviewed Today     Reviewed by Carla Milling, RPH-CPP (Pharmacist) on 12/15/23 at 1307  Med List Status: <None>   Medication Order Taking? Sig Documenting Provider Last Dose Status Informant  acetaminophen  (TYLENOL ) 650 MG CR tablet [id number] No Take 1 tablet (650 mg total) by mouth in the morning and at bedtime. Curtis Debby PARAS, MD 12/13/2023 Active   apixaban  (ELIQUIS ) 2.5 MG TABS tablet [id number] No TAKE 1 TABLET(2.5 MG) BY MOUTH TWICE DAILY Pietro Redell RAMAN, MD 12/13/2023 Active   atorvastatin  (LIPITOR) 40 MG tablet [id number] No TAKE 1 TABLET(40 MG) BY MOUTH DAILY Pietro Redell RAMAN, MD 12/13/2023 Active   benazepril  (LOTENSIN ) 20 MG tablet [id number] No Take 1 tablet (20 mg total) by mouth daily. Paz, Jose E, MD 12/13/2023 Active   clotrimazole -betamethasone  (LOTRISONE ) cream [id number] No Apply 1 Application topically 2 (two) times daily. Paz, Jose E, MD 12/13/2023 Active   Coenzyme Q10 (COQ10) 100 MG CAPS [id number] No Take 1 capsule by mouth daily.  [provider] 12/13/2023 Active Self  furosemide  (LASIX ) 40 MG tablet [id number] No Take 1 tablet (40 mg total) by mouth daily. Amon Aloysius BRAVO, MD 12/13/2023 Active   Melatonin 10 MG TABS [id number] No Take 10 mg by mouth at bedtime.  [provider] 12/13/2023 Active Self  metoprolol  succinate (TOPROL -XL) 25 MG 24 hr tablet [id number] No Take 1 tablet (25 mg total) by mouth daily. Take with or immediately following a meal Paz, Jose E, MD 12/13/2023 Active   Multiple Vitamin (MULTIVITAMIN WITH MINERALS) TABS tablet [id number] No Take 1 tablet by mouth daily. [provider] 12/13/2023 Active Self  Semaglutide  (RYBELSUS ) 7 MG TABS [id number] No Take 1 tablet (7 mg total) by mouth daily. Amon Aloysius BRAVO, MD 12/13/2023 Active   tamsulosin  (FLOMAX ) 0.4 MG CAPS capsule  [id number] No Take 1 capsule (0.4 mg total) by mouth daily after supper. Amon Aloysius BRAVO, MD 12/13/2023 Active               Assessment/Plan:   Diabetes: A1c at goal of < 7.0%: - Recommend to continue Rybelsus  7mg  daily.  - Called [employer] to check on 2025 patient assistance program. Automated system has end date of 12/08/2024 but I was not able to request his next refill.    Afib: controlled - continue current dose of Eliquis  2.5mg  twice a day and metoprolol  12.5mg  daily   Follow Up Plan: 1 to 2 months to check on medication assistance program for 2025.   Madelin Ray, PharmD Clinical Pharmacist [facility]  12/17/2023 - Addendum Spoke with representative with [employer] who confirmed patient's Rybelsus  is in process of being filled. Anticipated date of delivery to our office provided was 12/30/2023.   Madelin Ray, PharmD Clinical Pharmacist [facility]

## 2023-12-15 NOTE — Telephone Encounter (Signed)
 Copied from CRM [id number]. Topic: Medicare AWV >> Dec 15, 2023  1:35 PM Nathanel DEL wrote: Reason for CRM: Called LVM 12/15/2023 to schedule AWV. Please schedule Virtual or Telehealth visits ONLY  Nathanel Paschal; Care Guide Ambulatory Clinical Support [facility] Direct Dial: [phone]

## 2023-12-28 ENCOUNTER — Other Ambulatory Visit: Payer: Self-pay | Admitting: Cardiology

## 2024-01-01 ENCOUNTER — Telehealth: Payer: Self-pay

## 2024-01-01 NOTE — Telephone Encounter (Signed)
LMOM informing Pt that Rybelsus 7mg  has arrived and ready for pick up.

## 2024-01-06 ENCOUNTER — Other Ambulatory Visit: Payer: Self-pay | Admitting: Internal Medicine

## 2024-01-06 DIAGNOSIS — I4821 Permanent atrial fibrillation: Secondary | ICD-10-CM

## 2024-01-19 ENCOUNTER — Other Ambulatory Visit: Payer: Self-pay | Admitting: Internal Medicine

## 2024-01-19 ENCOUNTER — Telehealth: Payer: Self-pay

## 2024-01-19 ENCOUNTER — Ambulatory Visit: Payer: Self-pay | Admitting: Licensed Clinical Social Worker

## 2024-01-19 DIAGNOSIS — I10 Essential (primary) hypertension: Secondary | ICD-10-CM

## 2024-01-19 MED ORDER — OSELTAMIVIR PHOSPHATE 30 MG PO CAPS
30.0000 mg | ORAL_CAPSULE | Freq: Every day | ORAL | 0 refills | Status: DC
Start: 2024-01-19 — End: 2024-04-21

## 2024-01-19 NOTE — Telephone Encounter (Signed)
 Advised patient, I sent Tamiflu  for 10 days.  If he has symptoms needs to be seen. (Dose adjusted for renal function).

## 2024-01-19 NOTE — Telephone Encounter (Signed)
Spoke w/ Pt- informed of PCP recommendations. Pt verbalized understanding.  

## 2024-01-19 NOTE — Patient Instructions (Signed)
 Visit Information  Thank you for taking time to visit with me today. Please don't hesitate to contact me if I can be of assistance to you.   Following are the goals we discussed today:   Goals Addressed             This Visit's Progress    Patient Stated he is having pain issues in his left knee       Interventions:  LCSW spoke with [name], contact and friend of client, via phone today about client needs Carlon Chester said that client had been to ED regarding left knee pain.  Client is home now , recovering from knee pain issues.  He still tries to go to gym occasionally to exercise. Client enjoys going to gym. Carlon Chester said that client even played golf last week Discussed program support for client. Discussed program support with RN, LCSW, Pharmacist Discussed transport situation for client (previously). Client drives himself to and from  medical appointments. Client drives in community as needed to complete errands. Client is not having any transport issues. He drives himself to and from gym when he goes to gym Spoke of client support with PCP, Dr. Claudine Cullens Encouraged client  or [name] to call LCSW as needed for SW support for client at [phone] [name] was appreciative of LCSW all today to check on client needs         Our next appointment is by telephone on 03/29/24 at 11:00 AM   Please call the care guide team at [phone] if you need to cancel or reschedule your appointment.   If you are experiencing a Mental Health or Behavioral Health Crisis or need someone to talk to, please go to [facility] [address] [place] [phone])   The patient / [name] , contact for client, verbalized understanding of instructions, educational materials, and care plan provided today and DECLINED offer to receive copy of patient instructions, educational materials, and care plan.   The patient / [name], contact for client, has  been provided with contact information for the care management team and has been advised to call with any health related questions or concerns.    Alexandria Angel  MSW, LCSW [place] Licensed Clinical Social Worker Direct Dial:  [phone] Fax:  [phone] Website:  [email or web address]

## 2024-01-19 NOTE — Patient Outreach (Signed)
  Care Coordination   Follow Up Visit Note   01/19/2024 Name: [name] MRN: [id number] DOB: June 28, 1931  [name] is a 89 y.o. year old male who sees [name], Clayton Ket, MD for primary care. I spoke with  [name] /  [name], contact and friend of client, via phone today.  What matters to the patients health and wellness today?  Patient is having pain issues in his left knee    Goals Addressed             This Visit's Progress    Patient Stated he is having pain issues in his left knee       Interventions:  LCSW spoke with [name], contact and friend of client, via phone today about client needs [name] said that client had been to ED regarding left knee pain.  Client is home now , recovering from knee pain issues.  He still tries to go to gym occasionally to exercise. Client enjoys going to gym. [name] said that client even played golf last week Discussed program support for client. Discussed program support with RN, LCSW, Pharmacist Discussed transport situation for client (previously). Client drives himself to and from  medical appointments. Client drives in community as needed to complete errands. Client is not having any transport issues. He drives himself to and from gym when he goes to gym Spoke of client support with PCP, Dr. Claudine [name] Encouraged client  or [name] to call LCSW as needed for SW support for client at [phone] [name] was appreciative of LCSW all today to check on client needs         SDOH assessments and interventions completed:  Yes  SDOH Interventions Today    Flowsheet Row Most Recent Value  SDOH Interventions   Physical Activity Interventions Other (Comments)  [has some left knee pain. likes to go to gym to exercise if he is able to do so]  Stress Interventions Other (Comment)  [has some stress in managing medical needs]        Care Coordination Interventions:  Yes, provided    Interventions Today    Flowsheet Row Most  Recent Value  Chronic Disease   Chronic disease during today's visit Other  [spoke with [name], contact and friend of client, about client needs]  General Interventions   General Interventions Discussed/Reviewed General Interventions Discussed, Community Resources  Education Interventions   Education Provided Provided Education  Provided Verbal Education On [employer]  Mental Health Interventions   Mental Health Discussed/Reviewed Coping Strategies  [no mood issues noted]  Nutrition Interventions   Nutrition Discussed/Reviewed Nutrition Discussed  Pharmacy Interventions   Pharmacy Dicussed/Reviewed Pharmacy Topics Discussed  Safety Interventions   Safety Discussed/Reviewed Fall Risk       Follow up plan: Follow up call scheduled for 03/29/24 at 11:00 AM    Encounter Outcome:  Patient Visit Completed    [name]  MSW, LCSW [place] Licensed Clinical Social Worker Direct Dial:  [phone] Fax:  [phone] Website:  [email or web address]

## 2024-01-19 NOTE — Telephone Encounter (Signed)
 Copied from CRM [id number]. Topic: General - Other >> Jan 19, 2024 11:28 AM Howard Macho wrote: Reason for CRM: patient called stating he would like to be prescribed tamiflu  because he is living with someone that has the flu. Patient does not have any symptoms

## 2024-02-23 ENCOUNTER — Ambulatory Visit
Admission: EM | Admit: 2024-02-23 | Discharge: 2024-02-23 | Disposition: A | Discharge status: Home / Self Care | Attending: Family Medicine | Admitting: Family Medicine

## 2024-02-23 ENCOUNTER — Encounter: Payer: Self-pay | Admitting: Emergency Medicine

## 2024-02-23 DIAGNOSIS — R059 Cough, unspecified: Secondary | ICD-10-CM | POA: Diagnosis not present

## 2024-02-23 DIAGNOSIS — J0100 Acute maxillary sinusitis, unspecified: Secondary | ICD-10-CM | POA: Diagnosis not present

## 2024-02-23 MED ORDER — AMOXICILLIN-POT CLAVULANATE 875-125 MG PO TABS: 1.0000 | ORAL_TABLET | Freq: Two times a day (BID) | ORAL | 0 refills | Status: DC

## 2024-02-23 MED ORDER — HYDROCODONE BIT-HOMATROP MBR 5-1.5 MG/5ML PO SOLN: 5.0000 mL | Freq: Four times a day (QID) | ORAL | 0 refills | Status: DC | PRN

## 2024-02-23 NOTE — Discharge Instructions (Addendum)
 Advised patient to take medication as directed with food to completion.  Advised patient may take Hycodan cough syrup at night prior to sleep for cough due to sedative effects.  Encouraged increase daily water intake to 64 ounces per day while taking these medications.  Advised if symptoms worsen and/or unresolved please follow-up with your PCP or here for further evaluation.

## 2024-02-23 NOTE — ED Provider Notes (Signed)
 [name] CARE    CSN: [id number] Arrival date & time: 02/23/24  1146      History   Chief Complaint Chief Complaint  Patient presents with   URI    HPI [name] is a 89 y.o. male.   HPI pleasant 89 year old male presents with cough and cold for 1 month.  PMH significant for CHF, CAD (S/P CABG), atrial fibrillation, and CKD.  Patient is currently taking apixaban daily and denies any unusual bleeding.  Past Medical History:  Diagnosis Date   Anemia    Antral gastritis 2013   EGD    Atrial fibrillation (HCC)    CAD (coronary artery disease)    s/p CABG 1993 (L-LAD, S-RI, S-D1);   echo 1/10: EF 55%;    myoview 12/09: inf MI, no ischemia   Candida esophagitis (HCC) 2013   EGD    Cataracts, bilateral    Family history of malignant neoplasm of gastrointestinal tract    Folliculitis    Hiatal hernia    HTN (hypertension)    Hyperlipidemia    Irritable bladder    Myocardial infarction [facility])    Obesity    Osteoarthritis     Patient Active Problem List   Diagnosis Date Noted   CKD (chronic kidney disease) stage 3, GFR 30-59 ml/min (HCC) 07/01/2022   Right leg swelling 12/17/2021   Lymphadenopathy, inguinal, right 12/17/2021   Primary osteoarthritis of right knee 07/10/2021   Lumbar spondylosis 04/16/2019   CHF (congestive heart failure) (HCC) 12/17/2016   PCP NOTES >>>>> 10/11/2015   Carpal tunnel syndrome 02/15/2014   Anemia 08/14/2012   Annual physical exam 01/08/2012   Atrial fibrillation (HCC) 03/29/2011   Venous (peripheral) insufficiency 09/04/2009   OBESITY 07/04/2009   DJD (degenerative joint disease) 07/04/2009   SLEEP DISORDER 07/04/2009   Dyspepsia 02/28/2009   DM II (diabetes mellitus, type II), controlled (HCC) 04/25/2008   Hyperlipidemia 06/25/2007   Essential hypertension 06/25/2007   CAD (coronary artery disease) of artery bypass graft 06/25/2007    Past Surgical History:  Procedure Laterality Date   BLEPHAROPLASTY Bilateral  11/16/2021   upper lid   CATARACT EXTRACTION Right 07/21/2018   CHOLECYSTECTOMY     laparoscopic '92   CORONARY ARTERY BYPASS GRAFT     graft '92: LIMA-LAD, SVG - D2,R1, D1   ENTEROSCOPY  08/22/2012   Procedure: ENTEROSCOPY;  Surgeon: Charna Elizabeth, MD;  Location: [facility];  Service: Endoscopy;  Laterality: N/A;   INTRAOCULAR LENS EXCHANGE Right 07/21/2018   KNEE SURGERY     MOLE REMOVAL  2001 and 2008   PAROTIDECTOMY Right 06/18/2021   PILONIDAL CYST EXCISION         Home Medications    Prior to Admission medications   Medication Sig Start Date End Date Taking? Authorizing Provider  acetaminophen (TYLENOL) 650 MG CR tablet Take 1 tablet (650 mg total) by mouth in the morning and at bedtime. 03/18/22  Yes Monica Becton, MD  amoxicillin-clavulanate (AUGMENTIN) 875-125 MG tablet Take 1 tablet by mouth every 12 (twelve) hours. 02/23/24  Yes Trevor Iha, FNP  apixaban (ELIQUIS) 2.5 MG TABS tablet TAKE 1 TABLET(2.5 MG) BY MOUTH TWICE DAILY 04/29/23  Yes Lewayne Bunting, MD  atorvastatin (LIPITOR) 40 MG tablet TAKE 1 TABLET(40 MG) BY MOUTH DAILY 12/30/23  Yes Lewayne Bunting, MD  benazepril (LOTENSIN) 20 MG tablet Take 1 tablet (20 mg total) by mouth daily. 01/19/24  Yes Wanda Plump, MD  clotrimazole-betamethasone (LOTRISONE) cream Apply 1  Application topically 2 (two) times daily. 09/04/22  Yes Paz, [name], MD  Coenzyme Q10 (COQ10) 100 MG CAPS Take 1 capsule by mouth daily.    Yes [provider]  furosemide (LASIX) 40 MG tablet Take 1 tablet (40 mg total) by mouth daily. 06/13/23  Yes Paz, [name], MD  HYDROcodone bit-homatropine (HYCODAN) 5-1.5 MG/5ML syrup Take 5 mLs by mouth every 6 (six) hours as needed for cough. 02/23/24  Yes Trevor Iha, FNP  Melatonin 10 MG TABS Take 10 mg by mouth at bedtime.    Yes [provider]  metoprolol succinate (TOPROL-XL) 25 MG 24 hr tablet Take 1 tablet (25 mg total) by mouth daily. Take with or immediately following a  meal 01/06/24  Yes Paz, [name], MD  Multiple Vitamin (MULTIVITAMIN WITH MINERALS) TABS tablet Take 1 tablet by mouth daily.   Yes [provider]  oseltamivir (TAMIFLU) 30 MG capsule Take 1 capsule (30 mg total) by mouth daily. 01/19/24  Yes Paz, [name], MD  Semaglutide (RYBELSUS) 7 MG TABS Take 1 tablet (7 mg total) by mouth daily. 09/01/23  Yes Paz, [name], MD  tamsulosin (FLOMAX) 0.4 MG CAPS capsule Take 1 capsule (0.4 mg total) by mouth daily after supper. 11/25/23  Yes Wanda Plump, MD    Family History Family History  Problem Relation Age of Onset   Coronary artery disease Father        and brother   Heart attack Father        and brother-fatal   Stroke Father    Breast cancer Mother    Alzheimer's disease Mother    Colon cancer Maternal Uncle    Esophageal cancer Neg Hx    Stomach cancer Neg Hx    Rectal cancer Neg Hx    Prostate cancer Neg Hx     Social History Social History   Tobacco Use   Smoking status: Former    Current packs/day: 0.00    Types: Cigarettes    Start date: 08/14/1948    Quit date: 08/14/1964    Years since quitting: 59.5   Smokeless tobacco: Never   Tobacco comments:    quit 1965  Vaping Use   Vaping status: Never Used  Substance Use Topics   Alcohol use: Not Currently    Alcohol/week: 7.0 standard drinks of alcohol    Types: 7 Shots of liquor per week   Drug use: No     Allergies   Patient has no known allergies.   Review of Systems Review of Systems   Physical Exam Triage Vital Signs ED Triage Vitals  Encounter Vitals Group     BP 02/23/24 1201 132/65     Systolic BP Percentile --      Diastolic BP Percentile --      Pulse Rate 02/23/24 1201 (!) 58     Resp 02/23/24 1201 16     Temp 02/23/24 1201 97.7 F (36.5 C)     Temp Source 02/23/24 1201 Oral     SpO2 02/23/24 1201 94 %     Weight --      Height --      Head Circumference --      Peak Flow --      Pain Score 02/23/24 1202 0     Pain Loc --      Pain  Education --      Exclude from Growth Chart --    No data found.  Updated Vital  Signs BP 132/65 (BP Location: Right Arm)   Pulse (!) 58   Temp 97.7 F (36.5 C) (Oral)   Resp 16   SpO2 94%   Visual Acuity Right Eye Distance:   Left Eye Distance:   Bilateral Distance:    Right Eye Near:   Left Eye Near:    Bilateral Near:     Physical Exam Vitals and nursing note reviewed.  Constitutional:      General: He is not in acute distress.    Appearance: Normal appearance. He is normal weight. He is ill-appearing. He is not toxic-appearing or diaphoretic.  HENT:     Head: Normocephalic and atraumatic.     Right Ear: Tympanic membrane and external ear normal.     Left Ear: Tympanic membrane and external ear normal.     Ears:     Comments: Moderate eustachian tube dysfunction noted bilaterally    Mouth/Throat:     Mouth: Mucous membranes are moist.     Pharynx: Oropharynx is clear.  Eyes:     Extraocular Movements: Extraocular movements intact.     Conjunctiva/sclera: Conjunctivae normal.     Pupils: Pupils are equal, round, and reactive to light.  Cardiovascular:     Rate and Rhythm: Normal rate and regular rhythm.     Pulses: Normal pulses.     Heart sounds: Normal heart sounds.  Pulmonary:     Effort: Pulmonary effort is normal.     Breath sounds: Normal breath sounds. No wheezing, rhonchi or rales.     Comments: Infrequent nonproductive cough on exam Musculoskeletal:        General: Normal range of motion.     Cervical back: Normal range of motion and neck supple.  Skin:    General: Skin is warm and dry.  Neurological:     General: No focal deficit present.     Mental Status: He is alert and oriented to person, place, and time. Mental status is at baseline.  Psychiatric:        Mood and Affect: Mood normal.        Behavior: Behavior normal.      UC Treatments / Results  Labs (all labs ordered are listed, but only abnormal results are displayed) Labs Reviewed  - No data to display  EKG   Radiology No results found.  Procedures Procedures (including critical care time)  Medications Ordered in UC Medications - No data to display  Initial Impression / Assessment and Plan / UC Course  I have reviewed the triage vital signs and the nursing notes.  Pertinent labs & imaging results that were available during my care of the patient were reviewed by me and considered in my medical decision making (see chart for details).     MDM: 1.  Acute maxillary sinusitis, recurrence not specified-Rx'd Augmentin 875/125 mg tablet: Take 1 tablet twice daily x 7 days; 2.  Cough, unspecified type-Rx'd Hycodan 5-1.5 mg / 5 mL syrup: Take 5 mL every 6 hours as needed for cough. Advised patient to take medication as directed with food to completion.  Advised patient may take Hycodan cough syrup at night prior to sleep for cough due to sedative effects.  Encouraged increase daily water intake to 64 ounces per day while taking these medications.  Advised if symptoms worsen and/or unresolved please follow-up with your PCP or here for further evaluation.  Patient discharged home, hemodynamically stable. Final Clinical Impressions(s) / UC Diagnoses   Final diagnoses:  Acute maxillary  sinusitis, recurrence not specified  Cough, unspecified type     Discharge Instructions      Advised patient to take medication as directed with food to completion.  Advised patient may take Hycodan cough syrup at night prior to sleep for cough due to sedative effects.  Encouraged increase daily water intake to 64 ounces per day while taking these medications.  Advised if symptoms worsen and/or unresolved please follow-up with your PCP or here for further evaluation.     ED Prescriptions     Medication Sig Dispense Auth. Provider   amoxicillin-clavulanate (AUGMENTIN) 875-125 MG tablet Take 1 tablet by mouth every 12 (twelve) hours. 14 tablet Trevor Iha, FNP   HYDROcodone  bit-homatropine (HYCODAN) 5-1.5 MG/5ML syrup Take 5 mLs by mouth every 6 (six) hours as needed for cough. 120 mL Trevor Iha, FNP      I have reviewed the PDMP during this encounter.   Trevor Iha, FNP 02/23/24 1221

## 2024-02-23 NOTE — ED Triage Notes (Signed)
 Patient c/o cold & cough x 1 month.  Denies fever.  Denies any OTC meds.

## 2024-02-25 ENCOUNTER — Telehealth: Payer: Self-pay

## 2024-02-25 NOTE — Telephone Encounter (Signed)
 Called pt and left a VM informing pt his Rybelsus is here at the office. Medication has been placed up front for pt pick up.

## 2024-03-09 ENCOUNTER — Other Ambulatory Visit: Payer: Self-pay | Admitting: Internal Medicine

## 2024-03-15 DIAGNOSIS — L905 Scar conditions and fibrosis of skin: Secondary | ICD-10-CM | POA: Diagnosis not present

## 2024-03-15 DIAGNOSIS — Z859 Personal history of malignant neoplasm, unspecified: Secondary | ICD-10-CM | POA: Diagnosis not present

## 2024-03-15 DIAGNOSIS — D485 Neoplasm of uncertain behavior of skin: Secondary | ICD-10-CM | POA: Diagnosis not present

## 2024-03-15 DIAGNOSIS — L570 Actinic keratosis: Secondary | ICD-10-CM | POA: Diagnosis not present

## 2024-03-15 DIAGNOSIS — L821 Other seborrheic keratosis: Secondary | ICD-10-CM | POA: Diagnosis not present

## 2024-03-15 DIAGNOSIS — Z129 Encounter for screening for malignant neoplasm, site unspecified: Secondary | ICD-10-CM | POA: Diagnosis not present

## 2024-03-17 DIAGNOSIS — S99922A Unspecified injury of left foot, initial encounter: Secondary | ICD-10-CM | POA: Diagnosis not present

## 2024-03-17 DIAGNOSIS — M19072 Primary osteoarthritis, left ankle and foot: Secondary | ICD-10-CM | POA: Diagnosis not present

## 2024-03-29 ENCOUNTER — Encounter: Payer: Self-pay | Admitting: Licensed Clinical Social Worker

## 2024-03-29 ENCOUNTER — Other Ambulatory Visit: Payer: Self-pay | Admitting: Licensed Clinical Social Worker

## 2024-03-29 ENCOUNTER — Encounter: Payer: Medicare Other | Admitting: Licensed Clinical Social Worker

## 2024-04-09 ENCOUNTER — Telehealth: Payer: Self-pay | Admitting: *Deleted

## 2024-04-09 NOTE — Progress Notes (Signed)
 Complex Care Management Care Guide Note  04/09/2024 Name: [name] MRN: [id number] DOB: 1931/03/10  [name] is a 89 y.o. year old male who is a primary care patient of Ezell Hollow, MD and is actively engaged with the care management team. I reached out to Annice Barthel by phone today to assist with re-scheduling  with the Licensed Clinical Child psychotherapist.  Follow up plan: Unsuccessful telephone outreach attempt made. A HIPAA compliant phone message was left for the patient providing contact information and requesting a return call.  Barnie Bora  [facility] Health  Value-Based Care Institute, [facility] Guide  Direct Dial: [phone]  Fax [phone]

## 2024-04-13 ENCOUNTER — Telehealth: Payer: Self-pay | Admitting: Cardiology

## 2024-04-13 ENCOUNTER — Other Ambulatory Visit: Payer: Self-pay

## 2024-04-13 MED ORDER — APIXABAN 2.5 MG PO TABS: ORAL_TABLET | ORAL | 3 refills | Status: DC

## 2024-04-13 NOTE — Telephone Encounter (Signed)
 Prescription refill request for Eliquis  received. Indication:afib Last office visit:8/24 Scr:1.63  12/24 Age: 89 Weight:96.8  kg  Prescription refilled

## 2024-04-13 NOTE — Telephone Encounter (Signed)
*  STAT* If patient is at the pharmacy, call can be transferred to refill team.   1. Which medications need to be refilled? (please list name of each medication and dose if known)   apixaban  (ELIQUIS ) 2.5 MG TABS tablet    2. Which pharmacy/location (including street and city if local pharmacy) is medication to be sent to?  [id number] - [place], [place] - [address]      3. Do they need a 30 day or 90 day supply? 90 day

## 2024-04-14 ENCOUNTER — Encounter (HOSPITAL_COMMUNITY): Payer: Self-pay

## 2024-04-15 NOTE — Telephone Encounter (Signed)
 Eliquis  2.5mg  refill request received. Patient is 89 years old, weight-96.8kg, Crea-1.63 on 12/09/23, Diagnosis-Afib, and last seen by Dr. Audery Blazing on 08/06/23. Dose is appropriate based on dosing criteria.   Per policy unable to send refill to a [employer]. A printed prescription would have to picked up. Will send to the Physician Nurse.

## 2024-04-15 NOTE — Telephone Encounter (Signed)
*  STAT* If patient is at the pharmacy, call can be transferred to refill team.   1. Which medications need to be refilled? (please list name of each medication and dose if known)   apixaban  (ELIQUIS ) 2.5 MG TABS tablet    2. Which pharmacy/location (including street and city if local pharmacy) is medication to be sent to?  [facility]  [phone] fax [phone]   3. Do they need a 30 day or 90 day supply? 90 day    Pt called stating he'd like for his medication refill to go to this pharmacy instead of [facility]. He stated it's been done before. He's out of medication.

## 2024-04-19 MED ORDER — APIXABAN 2.5 MG PO TABS: ORAL_TABLET | ORAL | 3 refills | Status: DC

## 2024-04-19 NOTE — Telephone Encounter (Signed)
 Spoke with pt, aware will print prescription and place at the front for pick up.

## 2024-04-21 ENCOUNTER — Ambulatory Visit (INDEPENDENT_AMBULATORY_CARE_PROVIDER_SITE_OTHER): Payer: Medicare Other | Admitting: Internal Medicine

## 2024-04-21 ENCOUNTER — Encounter: Payer: Self-pay | Admitting: Internal Medicine

## 2024-04-21 VITALS — BP 134/74 | HR 50 | Temp 98.2°F | Resp 18 | Ht 71.0 in | Wt 211.1 lb

## 2024-04-21 DIAGNOSIS — Z0000 Encounter for general adult medical examination without abnormal findings: Secondary | ICD-10-CM

## 2024-04-21 DIAGNOSIS — R399 Unspecified symptoms and signs involving the genitourinary system: Secondary | ICD-10-CM | POA: Diagnosis not present

## 2024-04-21 DIAGNOSIS — I10 Essential (primary) hypertension: Secondary | ICD-10-CM

## 2024-04-21 DIAGNOSIS — E1159 Type 2 diabetes mellitus with other circulatory complications: Secondary | ICD-10-CM | POA: Diagnosis not present

## 2024-04-21 DIAGNOSIS — E7800 Pure hypercholesterolemia, unspecified: Secondary | ICD-10-CM

## 2024-04-21 DIAGNOSIS — Z7984 Long term (current) use of oral hypoglycemic drugs: Secondary | ICD-10-CM | POA: Diagnosis not present

## 2024-04-21 DIAGNOSIS — N1832 Chronic kidney disease, stage 3b: Secondary | ICD-10-CM | POA: Diagnosis not present

## 2024-04-21 DIAGNOSIS — Z0001 Encounter for general adult medical examination with abnormal findings: Secondary | ICD-10-CM

## 2024-04-21 LAB — CBC WITH DIFFERENTIAL/PLATELET
Basophils Absolute: 0 10*3/uL (ref 0.0–0.1)
Basophils Relative: 0.4 % (ref 0.0–3.0)
Eosinophils Absolute: 0.1 10*3/uL (ref 0.0–0.7)
Eosinophils Relative: 2.3 % (ref 0.0–5.0)
HCT: 42.5 % (ref 39.0–52.0)
Hemoglobin: 14.1 g/dL (ref 13.0–17.0)
Lymphocytes Relative: 31.8 % (ref 12.0–46.0)
Lymphs Abs: 1.7 10*3/uL (ref 0.7–4.0)
MCHC: 33.2 g/dL (ref 30.0–36.0)
MCV: 97.3 fl (ref 78.0–100.0)
Monocytes Absolute: 0.5 10*3/uL (ref 0.1–1.0)
Monocytes Relative: 9.1 % (ref 3.0–12.0)
Neutro Abs: 3.1 10*3/uL (ref 1.4–7.7)
Neutrophils Relative %: 56.4 % (ref 43.0–77.0)
Platelets: 180 10*3/uL (ref 150.0–400.0)
RBC: 4.37 Mil/uL (ref 4.22–5.81)
RDW: 14.3 % (ref 11.5–15.5)
WBC: 5.4 10*3/uL (ref 4.0–10.5)

## 2024-04-21 LAB — BASIC METABOLIC PANEL WITH GFR
BUN: 25 mg/dL — ABNORMAL HIGH (ref 6–23)
CO2: 27 meq/L (ref 19–32)
Calcium: 9.4 mg/dL (ref 8.4–10.5)
Chloride: 105 meq/L (ref 96–112)
Creatinine, Ser: 1.76 mg/dL — ABNORMAL HIGH (ref 0.40–1.50)
GFR: 33.64 mL/min — ABNORMAL LOW (ref >60.00)
Glucose, Bld: 139 mg/dL — ABNORMAL HIGH (ref 70–99)
Potassium: 4.5 meq/L (ref 3.5–5.1)
Sodium: 140 meq/L (ref 135–145)

## 2024-04-21 LAB — URINALYSIS, ROUTINE W REFLEX MICROSCOPIC
Bilirubin Urine: NEGATIVE
Hgb urine dipstick: NEGATIVE
Ketones, ur: NEGATIVE
Leukocytes,Ua: NEGATIVE
Nitrite: NEGATIVE
Specific Gravity, Urine: 1.01 (ref 1.000–1.030)
Total Protein, Urine: 100 — AB
Urine Glucose: NEGATIVE
Urobilinogen, UA: 0.2 (ref 0.0–1.0)
pH: 6 (ref 5.0–8.0)

## 2024-04-21 LAB — HEMOGLOBIN A1C: Hgb A1c MFr Bld: 6.9 % — ABNORMAL HIGH (ref 4.6–6.5)

## 2024-04-21 LAB — PSA: PSA: 1.79 ng/mL (ref 0.10–4.00)

## 2024-04-21 NOTE — Patient Instructions (Signed)
   GO TO THE LAB : Get the blood work     Next office visit for a checkup in 3 months Please make an appointment before you leave today

## 2024-04-21 NOTE — Progress Notes (Unsigned)
 Subjective:    Patient ID: Annice Barthel, male    DOB: 1931-04-10, 89 y.o.   MRN: [id number]  DOS:  04/21/2024 Type of visit - description: CPX  Here for CPX. Chronic medical problems addressed. Reports LUTS, chronic, slightly worse for a year?  + Urinary urgency and frequency.  Denies hematuria, dysuria or lower abdominal pain Also continue with "constipation" in reality he says that has a bowel movement daily but 2 times after the BM he continued having small amounts of stools coming out. No nausea or vomiting.  No blood in the stools.   Review of Systems  Other than above, a 14 point review of systems is negative     Past Medical History:  Diagnosis Date   Anemia    Antral gastritis 2013   EGD    Atrial fibrillation (HCC)    CAD (coronary artery disease)    s/p CABG 1993 (L-LAD, S-RI, S-D1);   echo 1/10: EF 55%;    myoview  12/09: inf MI, no ischemia   Candida esophagitis (HCC) 2013   EGD    Cataracts, bilateral    Family history of malignant neoplasm of gastrointestinal tract    Folliculitis    Hiatal hernia    HTN (hypertension)    Hyperlipidemia    Irritable bladder    Myocardial infarction [facility])    Obesity    Osteoarthritis     Past Surgical History:  Procedure Laterality Date   BLEPHAROPLASTY Bilateral 11/16/2021   upper lid   CATARACT EXTRACTION Right 07/21/2018   CHOLECYSTECTOMY     laparoscopic '92   CORONARY ARTERY BYPASS GRAFT     graft '92: LIMA-LAD, SVG - D2,R1, D1   ENTEROSCOPY  08/22/2012   Procedure: ENTEROSCOPY;  Surgeon: Tami Falcon, MD;  Location: [facility];  Service: Endoscopy;  Laterality: N/A;   INTRAOCULAR LENS EXCHANGE Right 07/21/2018   KNEE SURGERY     MOLE REMOVAL  2001 and 2008   PAROTIDECTOMY Right 06/18/2021   PILONIDAL CYST EXCISION     Social History   Socioeconomic History   Marital status: Widowed    Spouse name: Not on file   Number of children: 1   Years of education: Not on file   Highest education level: Not on  file  Occupational History   Occupation: Retired    Associate Professor: RETIRED  Tobacco Use   Smoking status: Former    Current packs/day: 0.00    Types: Cigarettes    Start date: 08/14/1948    Quit date: 08/14/1964    Years since quitting: 59.7   Smokeless tobacco: Never   Tobacco comments:    quit 1965  Vaping Use   Vaping status: Never Used  Substance and Sexual Activity   Alcohol use: Not Currently    Alcohol/week: 7.0 standard drinks of alcohol    Types: 7 Shots of liquor per week   Drug use: No   Sexual activity: Not Currently  Other Topics Concern   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 Married '57- widowed 2024/05/06. 1 son -'16- died '13-May-2024 MVA.    Lives w/ girlfriend     Emergency contact: see DPR, brother and  Andriette Banner [phone] (friend)   Social Drivers of Health   Financial Resource Strain: Low Risk  (06/18/2021)   Received from [facility], [facility]   Overall Financial Resource Strain (CARDIA)    Difficulty of Paying  Living Expenses: Not hard at all  Food Insecurity: No Food Insecurity (08/01/2021)   Hunger Vital Sign    Worried About Running Out of Food in the Last Year: Never true    Ran Out of Food in the Last Year: Never true  Transportation Needs: No Transportation Needs (08/01/2021)   PRAPARE - Administrator, Civil Service (Medical): No    Lack of Transportation (Non-Medical): No  Physical Activity: Insufficiently Active (01/19/2024)   Exercise Vital Sign    Days of Exercise per Week: 2 days    Minutes of Exercise per Session: 10 min  Stress: Stress Concern Present (01/19/2024)   [employer] of Occupational Health - Occupational Stress Questionnaire    Feeling of Stress : To some extent  Social Connections: Moderately Isolated (07/21/2023)   Social Connection and Isolation Panel [NHANES]    Frequency of Communication with Friends and Family: Twice a week    Frequency of Social Gatherings with Friends  and Family: Twice a week    Attends Religious Services: Never    Database administrator or Organizations: Yes    Attends Banker Meetings: Never    Marital Status: Widowed  Intimate Partner Violence: Unknown (03/11/2022)   Received from [facility], [facility]   HITS    Physically Hurt: Not on file    Insult or Talk Down To: Not on file    Threaten Physical Harm: Not on file    Scream or Curse: Not on file    Current Outpatient Medications  Medication Instructions   acetaminophen  (TYLENOL ) 650 mg, Oral, 2 times daily   apixaban  (ELIQUIS ) 2.5 MG TABS tablet TAKE 1 TABLET(2.5 MG) BY MOUTH TWICE DAILY   atorvastatin  (LIPITOR) 40 MG tablet TAKE 1 TABLET(40 MG) BY MOUTH DAILY   benazepril  (LOTENSIN ) 20 mg, Oral, Daily   clotrimazole -betamethasone  (LOTRISONE ) cream 1 Application, Topical, 2 times daily   Coenzyme Q10 (COQ10) 100 MG CAPS 1 capsule, Daily   furosemide  (LASIX ) 40 mg, Oral, Daily   Melatonin 10 mg, Daily at bedtime   metoprolol  succinate (TOPROL -XL) 25 mg, Oral, Daily, Take with or immediately following a meal   Multiple Vitamin (MULTIVITAMIN WITH MINERALS) TABS tablet 1 tablet, Daily   Rybelsus  7 mg, Oral, Daily   tamsulosin  (FLOMAX ) 0.4 mg, Oral, Daily after supper       Objective:   Physical Exam BP 134/74   Pulse (!) 50   Temp 98.2 F (36.8 C) (Oral)   Resp 18   Ht 5\' 11"  (1.803 m)   Wt 211 lb 2 oz (95.8 kg)   SpO2 96%   BMI 29.45 kg/m  General: Well developed, NAD, BMI noted Neck: No  thyromegaly  HEENT:  Normocephalic . Face symmetric, atraumatic Lungs:  CTA B Normal respiratory effort, no intercostal retractions, no accessory muscle use. Heart: Regularly irregular Abdomen:  Not distended, soft, non-tender. No rebound or rigidity. DRE: Prostate size seems to be normal, has a R nodule ~ 6 mm in size, slightly tender Lower extremities: Trace pretibial edema.   Skin: Exposed areas without rash. Not pale. Not jaundice Neurologic:   alert & oriented X3.  Speech normal, gait appropriate for age and unassisted Strength symmetric and appropriate for age.  Psych: Cognition and judgment appear intact.  Cooperative with normal attention span and concentration.  Behavior appropriate. No anxious or depressed appearing.     Assessment    Problem list: DM:+ mild neuropathy.  avoiding Metformin -CKD. Avoiding Actos d/t  h/o  CHF.  ; Farxiga  12-2022 (balanitis) HTN Hyperlipidemia CKD: Chronic, CT abdomen 2021: No  obstruction, bilateral cortical thinning. CV: --CAD, CABG 1993, Myoview  2009 no ischemia. --Atrial fibrillation -- rate control, xarelto  -- chronic combined systolic/diastolic congestive heart failure Venous insufficiency, mild LE edema L>>R  LUTS    MSK: DJD, spinal stenosis, R wrist Fx in the 80s GI: --Recurrent melena: Work-up 2013 Dr. Adan Holms: Colonoscopy, EGD, capsule endoscopy.  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 HTN: BP looks good today, continue Lotensin , Lasix , metoprolol . CKD: Check a BMP and CBC. LUTS: Chronic, slightly worse than before, previous DRE was 2021, at the time he had a R nodule about 3 mm in size, today's DRE:  same nodule is ~  6 mm in size.  Will check a PSA UA urine culture. If there is a obvious infection he will be treated  He started to do Kegel exercises (  recommended by a friend) and that seems to be helping his LUTS.. If PSA is elevated and cancer is suspected, he probably will need to see urology noting that he will not desire  (or needed) aggressive evaluation. "Constipation": In reality he has 1 BM daily, sometimes have 2-3 additional small stools afterwards.  No blood.  Observation. A-fib, CAD: Anticoagulated, asx  RTC 3 months

## 2024-04-21 NOTE — Progress Notes (Signed)
 Complex Care Management Care Guide Note  04/21/2024 Name: FILLIP BOYADJIAN MRN: [id number] DOB: 06-07-1931  TESHON CHASSE is a 89 y.o. year old male who is a primary care patient of Ezell Hollow, MD and is actively engaged with the care management team. I reached out to Annice Barthel by phone today to assist with re-scheduling  with the Licensed Clinical Child psychotherapist.  Follow up plan: Patient declined to reschedule with Scott LCSW.  No further outreach attempts will be made.  Barnie Bora  [facility] Health  Value-Based Care Institute, [facility] Guide  Direct Dial: [phone]  Fax [phone]

## 2024-04-22 ENCOUNTER — Ambulatory Visit: Payer: Self-pay | Admitting: Internal Medicine

## 2024-04-22 ENCOUNTER — Encounter: Payer: Self-pay | Admitting: Internal Medicine

## 2024-04-22 LAB — URINE CULTURE
MICRO NUMBER:: 16454574
Result:: NO GROWTH
SPECIMEN QUALITY:: ADEQUATE

## 2024-04-22 NOTE — Assessment & Plan Note (Signed)
 Here for CPX  Other issues addressed: DM: On Rybelsus , check A1c. HTN: BP looks good today, continue Lotensin , Lasix , metoprolol . CKD: Check a BMP and CBC. LUTS: Chronic, slightly worse than before, previous DRE was 2021, at the time he had a R nodule about 3 mm in size, today's DRE:  same nodule is ~  6 mm in size.  Will check a PSA UA urine culture. If there is a obvious infection he will be treated  He started to do Kegel exercises (  recommended by a friend) and that seems to be helping his LUTS.. If PSA is elevated and cancer is suspected, he probably will need to see urology noting that he will not desire (or needed) aggressive evaluation. "Constipation": In reality he has 1 BM daily, sometimes have 2-3 additional small stools afterwards.  No blood.  Observation. A-fib, CAD: Anticoagulated, asx  RTC 3 months

## 2024-04-22 NOTE — Assessment & Plan Note (Signed)
 Here for CPX -Td 2016 - PNM 23: 2013; 2022; PNM 13 :2015  - s/p zostavax 2016; s/p shingrex  - Vaccines I recommend: Flu shot every fall, RSV, COVID booster if not done recently -No further prostate or colon cancer screening (see comments under LUTS)

## 2024-05-06 DIAGNOSIS — Z85828 Personal history of other malignant neoplasm of skin: Secondary | ICD-10-CM | POA: Diagnosis not present

## 2024-05-06 DIAGNOSIS — C44320 Squamous cell carcinoma of skin of unspecified parts of face: Secondary | ICD-10-CM | POA: Diagnosis not present

## 2024-05-06 DIAGNOSIS — Z08 Encounter for follow-up examination after completed treatment for malignant neoplasm: Secondary | ICD-10-CM | POA: Diagnosis not present

## 2024-05-13 ENCOUNTER — Telehealth: Payer: Self-pay | Admitting: Cardiology

## 2024-05-13 NOTE — Telephone Encounter (Signed)
 Pt c/o medication issue:  1. Name of Medication:   apixaban  (ELIQUIS ) 2.5 MG TABS tablet    2. How are you currently taking this medication (dosage and times per day)? As written   3. Are you having a reaction (difficulty breathing--STAT)? No   4. What is your medication issue? Pt states he got this medication sent to [place]. I tried to send a refill request to have the medication picked up in the pharmacy and he refused and would like to come pick up a script in the office. Please advise

## 2024-05-14 MED ORDER — APIXABAN 2.5 MG PO TABS: ORAL_TABLET | ORAL | 3 refills | Status: AC

## 2024-05-14 NOTE — Telephone Encounter (Signed)
 Left message for patient, aware script is ready for pick up.

## 2024-05-24 ENCOUNTER — Other Ambulatory Visit: Payer: Self-pay | Admitting: Internal Medicine

## 2024-05-25 DIAGNOSIS — C7989 Secondary malignant neoplasm of other specified sites: Secondary | ICD-10-CM | POA: Diagnosis not present

## 2024-05-25 DIAGNOSIS — H6121 Impacted cerumen, right ear: Secondary | ICD-10-CM | POA: Diagnosis not present

## 2024-06-03 ENCOUNTER — Ambulatory Visit

## 2024-06-09 ENCOUNTER — Telehealth: Payer: Self-pay

## 2024-06-09 NOTE — Telephone Encounter (Signed)
 Tried calling Pt to let him know that Rybelsus  is here, no answer, unable to leave message.

## 2024-06-18 ENCOUNTER — Ambulatory Visit (INDEPENDENT_AMBULATORY_CARE_PROVIDER_SITE_OTHER): Admitting: *Deleted

## 2024-06-18 VITALS — Ht 71.0 in | Wt 213.0 lb

## 2024-06-18 DIAGNOSIS — Z0000 Encounter for general adult medical examination without abnormal findings: Secondary | ICD-10-CM | POA: Diagnosis not present

## 2024-06-18 NOTE — Progress Notes (Signed)
 Subjective:   [name] is a 89 y.o. who presents for a Medicare Wellness preventive visit.  As a reminder, Annual Wellness Visits don't include a physical exam, and some assessments may be limited, especially if this visit is performed virtually. We may recommend an in-person follow-up visit with your provider if needed.  Visit Complete: Virtual I connected with  Koren KANDICE Ahle on 06/18/24 by a audio enabled telemedicine application and verified that I am speaking with the correct person using two identifiers.  Patient Location: Home  Provider Location: Office/Clinic  I discussed the limitations of evaluation and management by telemedicine. The patient expressed understanding and agreed to proceed.  Vital Signs: Because this visit was a virtual/telehealth visit, some criteria may be missing or patient reported. Any vitals not documented were not able to be obtained and vitals that have been documented are patient reported.  VideoDeclined- This patient declined Librarian, academic. Therefore the visit was completed with audio only.  Persons Participating in Visit: Patient.  AWV Questionnaire: No: Patient Medicare AWV questionnaire was not completed prior to this visit.  Cardiac Risk Factors include: advanced age (>77men, >1 women);diabetes mellitus;dyslipidemia;hypertension;male gender;Other (see comment), Risk factor comments: Afib, CAD, CHF, CKD     Objective:    Today's Vitals   06/18/24 1506  Weight: 213 lb (96.6 kg)  Height: 5' 11 (1.803 m)   Body mass index is 29.71 kg/m.     06/18/2024    3:19 PM 05/13/2023    8:46 AM 08/07/2022    1:10 PM 08/01/2021    3:46 PM 04/22/2020   10:04 AM 04/10/2020    9:34 AM 02/14/2015    2:42 PM  Advanced Directives  Does Patient Have a Medical Advance Directive? No No Yes Yes No No No   Type of Best boy of [place];Living will Healthcare Power of [place];Living will     Does patient  want to make changes to medical advance directive?   No - Patient declined      Copy of Healthcare Power of Attorney in Chart?   No - copy requested No - copy requested     Would patient like information on creating a medical advance directive? No - Patient declined No - Patient declined    No - Patient declined      Data saved with a previous flowsheet row definition    Current Medications (verified) Outpatient Encounter Medications as of 06/18/2024  Medication Sig   acetaminophen  (TYLENOL ) 650 MG CR tablet Take 1 tablet (650 mg total) by mouth in the morning and at bedtime.   apixaban  (ELIQUIS ) 2.5 MG TABS tablet TAKE 1 TABLET(2.5 MG) BY MOUTH TWICE DAILY   atorvastatin  (LIPITOR) 40 MG tablet TAKE 1 TABLET(40 MG) BY MOUTH DAILY   benazepril  (LOTENSIN ) 20 MG tablet Take 1 tablet (20 mg total) by mouth daily.   Coenzyme Q10 (COQ10) 100 MG CAPS Take 1 capsule by mouth daily.    furosemide  (LASIX ) 40 MG tablet Take 1 tablet (40 mg total) by mouth daily.   Melatonin 10 MG TABS Take 10 mg by mouth at bedtime.    metoprolol  succinate (TOPROL -XL) 25 MG 24 hr tablet Take 1 tablet (25 mg total) by mouth daily. Take with or immediately following a meal   Multiple Vitamin (MULTIVITAMIN WITH MINERALS) TABS tablet Take 1 tablet by mouth daily.   Semaglutide  (RYBELSUS ) 7 MG TABS Take 1 tablet (7 mg total) by mouth daily.  tamsulosin  (FLOMAX ) 0.4 MG CAPS capsule Take 1 capsule (0.4 mg total) by mouth daily after supper.   [DISCONTINUED] clotrimazole -betamethasone  (LOTRISONE ) cream Apply 1 Application topically 2 (two) times daily. (Patient not taking: Reported on 06/18/2024)   No facility-administered encounter medications on file as of 06/18/2024.    Allergies (verified) Patient has no known allergies.   History: Past Medical History:  Diagnosis Date   Anemia    Antral gastritis 2013   EGD    Atrial fibrillation (HCC)    CAD (coronary artery disease)    s/p CABG 1993 (L-LAD, S-RI, S-D1);   echo  1/10: EF 55%;    myoview  12/09: inf MI, no ischemia   Candida esophagitis (HCC) 2013   EGD    Cataracts, bilateral    Family history of malignant neoplasm of gastrointestinal tract    Folliculitis    Hiatal hernia    HTN (hypertension)    Hyperlipidemia    Irritable bladder    Myocardial infarction [facility])    Obesity    Osteoarthritis    Past Surgical History:  Procedure Laterality Date   BLEPHAROPLASTY Bilateral 11/16/2021   upper lid   CATARACT EXTRACTION Right 07/21/2018   CHOLECYSTECTOMY     laparoscopic '92   CORONARY ARTERY BYPASS GRAFT     graft '92: LIMA-LAD, SVG - D2,R1, D1   ENTEROSCOPY  08/22/2012   Procedure: ENTEROSCOPY;  Surgeon: Renaye Sous, MD;  Location: [facility];  Service: Endoscopy;  Laterality: N/A;   INTRAOCULAR LENS EXCHANGE Right 07/21/2018   KNEE SURGERY     MOLE REMOVAL  2001 and 2008   PAROTIDECTOMY Right 06/18/2021   PILONIDAL CYST EXCISION     Family History  Problem Relation Age of Onset   Coronary artery disease Father        and brother   Heart attack Father        and brother-fatal   Stroke Father    Breast cancer Mother    Alzheimer's disease Mother    Colon cancer Maternal Uncle    Esophageal cancer Neg Hx    Stomach cancer Neg Hx    Rectal cancer Neg Hx    Prostate cancer Neg Hx    Social History   Socioeconomic History   Marital status: Widowed    Spouse name: Not on file   Number of children: 1   Years of education: Not on file   Highest education level: Not on file  Occupational History   Occupation: Retired    Associate Professor: RETIRED  Tobacco Use   Smoking status: Former    Current packs/day: 0.00    Types: Cigarettes    Start date: 08/14/1948    Quit date: 08/14/1964    Years since quitting: 59.8   Smokeless tobacco: Never   Tobacco comments:    quit 1965  Vaping Use   Vaping status: Never Used  Substance and Sexual Activity   Alcohol use: Not Currently    Alcohol/week: 7.0 standard drinks of alcohol    Types: 7  Shots of liquor per week   Drug use: No   Sexual activity: Not Currently  Other Topics Concern   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 Married '57- widowed Jun 21, 2024. 1 son -'69- died '06/28/2024 MVA.    Lives w/ girlfriend     Emergency contact: see DPR, brother and  Ronnald Gaw [phone] (friend)   Social Drivers of Health  Financial Resource Strain: Low Risk  (06/18/2021)   Received from [facility]   Overall Financial Resource Strain (CARDIA)    Difficulty of Paying Living Expenses: Not hard at all  Food Insecurity: No Food Insecurity (06/18/2024)   Hunger Vital Sign    Worried About Running Out of Food in the Last Year: Never true    Ran Out of Food in the Last Year: Never true  Transportation Needs: No Transportation Needs (08/01/2021)   PRAPARE - Administrator, Civil Service (Medical): No    Lack of Transportation (Non-Medical): No  Physical Activity: Sufficiently Active (06/18/2024)   Exercise Vital Sign    Days of Exercise per Week: 6 days    Minutes of Exercise per Session: 80 min  Stress: No Stress Concern Present (06/18/2024)   [employer] of Occupational Health - Occupational Stress Questionnaire    Feeling of Stress: Not at all  Social Connections: Moderately Isolated (06/18/2024)   Social Connection and Isolation Panel    Frequency of Communication with Friends and Family: Three times a week    Frequency of Social Gatherings with Friends and Family: Twice a week    Attends Religious Services: Never    Database administrator or Organizations: Yes    Attends Engineer, structural: More than 4 times per year    Marital Status: Widowed    Tobacco Counseling Counseling given: Not Answered Tobacco comments: quit 1965    Clinical Intake:  Pre-visit preparation completed: Yes  Pain : No/denies pain     BMI - recorded: 29.71 Nutritional Risks: None Diabetes: Yes CBG done?: No Did pt. bring in CBG  monitor from home?: No  Lab Results  Component Value Date   HGBA1C 6.9 (H) 04/21/2024   HGBA1C 7.2 (H) 12/09/2023   HGBA1C 6.5 08/12/2023             Activities of Daily Living     06/18/2024    3:28 PM 04/21/2024    8:40 AM  In your present state of health, do you have any difficulty performing the following activities:  Hearing? 0 0  Vision? 0 0  Difficulty concentrating or making decisions? 0 0  Walking or climbing stairs? 1 0  Comment doesn't take them   Dressing or bathing? 0 0  Doing errands, shopping? 0 0  Preparing Food and eating ? N   Using the Toilet? N   In the past six months, have you accidently leaked urine? N   Do you have problems with loss of bowel control? N   Managing your Medications? N   Managing your Finances? N   Housekeeping or managing your Housekeeping? N     Patient Care Team: Amon Aloysius BRAVO, MD as PCP - General (Internal Medicine) Pietro Redell RAMAN, MD as PCP - Cardiology (Cardiology) Kristie Lamprey, MD as Consulting Physician (Gastroenterology) Shari Sieving, MD as Consulting Physician (Orthopedic Surgery) Maranda Oneil DEL, MD as Consulting Physician (Ophthalmology) Burnetta Aures, MD as Consulting Physician (Orthopedic Surgery) Millicent Rush, MD as Referring Physician (Otolaryngology) Elnor Mulders, MD as Referring Physician (Ophthalmology)  I have updated your Care Teams any recent Medical Services you may have received from other providers in the past year.     Assessment:   This is a routine wellness examination for Emeril.  Hearing/Vision screen Hearing Screening - Comments:: Denies hearing difficulties.  Vision Screening - Comments:: Has eye exam with  Dr Elnor at [facility] in September.  Goals Addressed   None    Depression Screen     06/18/2024    3:16 PM 04/21/2024    8:38 AM 12/09/2023    8:40 AM 08/12/2023    8:02 AM 05/06/2023   10:48 AM 12/11/2022    9:03 AM 09/04/2022   11:27 AM  PHQ 2/9 Scores  PHQ -  2 Score 0 0 0 0 0 0 0  PHQ- 9 Score 0          Fall Risk     06/18/2024    3:11 PM 04/21/2024    8:38 AM 12/09/2023    8:40 AM 08/12/2023    8:02 AM 05/06/2023   10:48 AM  Fall Risk   Falls in the past year? 0 0 0 0 0  Number falls in past yr: 0 0 0 0 0  Injury with Fall? 0 0 0 0 0  Risk for fall due to : --      Risk for fall due to: Comment has DJD, OA rt knee      Follow up Education provided Falls evaluation completed;Education provided Falls evaluation completed;Education provided Falls evaluation completed Falls evaluation completed    MEDICARE RISK AT HOME:  Medicare Risk at Home Any stairs in or around the home?: Yes (never uses) If so, are there any without handrails?: No Home free of loose throw rugs in walkways, pet beds, electrical cords, etc?: Yes Adequate lighting in your home to reduce risk of falls?: Yes Life alert?: Yes (wears ankle bracelet when playing golf) Use of a cane, walker or w/c?: No Grab bars in the bathroom?: Yes Shower chair or bench in shower?: No Elevated toilet seat or a handicapped toilet?: Yes (comfort height)  TIMED UP AND GO:  Was the test performed?  No,audio  Cognitive Function: 6CIT completed        06/18/2024    3:19 PM 08/07/2022    1:20 PM  6CIT Screen  What Year? 0 points 0 points  What month? 0 points 0 points  What time? 0 points 0 points  Count back from 20 0 points 0 points  Months in reverse 0 points 0 points  Repeat phrase 2 points 0 points  Total Score 2 points 0 points    Immunizations Immunization History  Administered Date(s) Administered   Fluad Quad(high Dose 65+) 09/08/2022   Influenza Split 08/27/2012, 09/06/2014, 09/09/2018   Influenza Whole 09/04/2009   Influenza, High Dose Seasonal PF 09/07/2019, 09/09/2023   Influenza-Unspecified 09/25/2015, 09/09/2016, 09/08/2017, 09/08/2020, 09/08/2021   PFIZER Comirnaty(Gray Top)Covid-19 Tri-Sucrose Vaccine 07/09/2021   PFIZER(Purple Top)SARS-COV-2 Vaccination  12/30/2019, 01/17/2020, 09/08/2020   Pneumococcal Conjugate-13 04/05/2014   Pneumococcal Polysaccharide-23 01/07/2012, 11/21/2021   Rsv, Bivalent, Protein Subunit Rsvpref,pf (Abrysvo) 01/02/2023   Td 10/20/2009   Tdap 03/17/2012, 02/14/2015   Zoster Recombinant(Shingrix) 10/27/2018, 12/26/2018   Zoster, Live 01/17/2015    Screening Tests Health Maintenance  Topic Date Due   Medicare Annual Wellness (AWV)  08/08/2023   COVID-19 Vaccine (5 - 2024-25 season) 06/18/2025 (Originally 08/10/2023)   INFLUENZA VACCINE  07/09/2024   OPHTHALMOLOGY EXAM  08/06/2024   FOOT EXAM  08/11/2024   HEMOGLOBIN A1C  10/22/2024   DTaP/Tdap/Td (4 - Td or Tdap) 02/13/2025   Pneumococcal Vaccine: 50+ Years  Completed   Zoster Vaccines- Shingrix  Completed   Hepatitis B Vaccines  Aged Out   HPV VACCINES  Aged Out   Meningococcal B Vaccine  Aged Out    Health Maintenance  Health Maintenance  Due  Topic Date Due   Medicare Annual Wellness (AWV)  08/08/2023   Health Maintenance Items Addressed: Pt doesn't plan to get further Covid vaccines. Will get foot exam at [facility] in August.  All other HM up to date.  Additional Screening:  Vision Screening: Recommended annual ophthalmology exams for early detection of glaucoma and other disorders of the eye. Would you like a referral to an eye doctor? No    Dental Screening: Recommended annual dental exams for proper oral hygiene  Community Resource Referral / Chronic Care Management: CRR required this visit?  No   CCM required this visit?  No   Plan:    I have personally reviewed and noted the following in the patient's chart:   Medical and social history Use of alcohol, tobacco or illicit drugs  Current medications and supplements including opioid prescriptions. Patient is not currently taking opioid prescriptions. Functional ability and status Nutritional status Physical activity Advanced directives List of other physicians Hospitalizations,  surgeries, and ER visits in previous 12 months Vitals Screenings to include cognitive, depression, and falls Referrals and appointments  In addition, I have reviewed and discussed with patient certain preventive protocols, quality metrics, and best practice recommendations. A written personalized care plan for preventive services as well as general preventive health recommendations were provided to patient.   Lolita Libra, CMA   06/18/2024   After Visit Summary: (MyChart) Due to this being a telephonic visit, the after visit summary with patients personalized plan was offered to patient via MyChart   Notes: Nothing significant to report at this time.

## 2024-06-18 NOTE — Patient Instructions (Addendum)
 Mr. Gell , Thank you for taking time out of your busy schedule to complete your Annual Wellness Visit with me. I enjoyed our conversation and look forward to speaking with you again next year. I, as well as your care team,  appreciate your ongoing commitment to your health goals. Please review the following plan we discussed and let me know if I can assist you in the future. Your Game plan/ To Do List    Follow up Visits: Next Medicare AWV with our clinical staff: 06/22/25  3pm    Next Office Visit with your provider: 07/26/24  8am  Clinician Recommendations:  Aim for 30 minutes of exercise or brisk walking, 6-8 glasses of water, and 5 servings of fruits and vegetables each day.       This is a list of the screening recommended for you and due dates:  Health Maintenance  Topic Date Due   COVID-19 Vaccine (5 - 2024-25 season) 06/18/2025*   Flu Shot  07/09/2024   Eye exam for diabetics  08/06/2024   Complete foot exam   08/11/2024   Hemoglobin A1C  10/22/2024   DTaP/Tdap/Td vaccine (4 - Td or Tdap) 02/13/2025   Medicare Annual Wellness Visit  06/18/2025   Pneumococcal Vaccine for age over 38  Completed   Zoster (Shingles) Vaccine  Completed   Hepatitis B Vaccine  Aged Out   HPV Vaccine  Aged Out   Meningitis B Vaccine  Aged Out  *Topic was postponed. The date shown is not the original due date.    Advanced directives: (ACP Link)Information on Advanced Care Planning can be found at [place]  Secretary of [facility] Advance Health Care Directives Advance Health Care Directives. [email or web address]  Advance Care Planning is important because it:  [x]  Makes sure you receive the medical care that is consistent with your values, goals, and preferences  [x]  It provides guidance to your family and loved ones and reduces their decisional burden about whether or not they are making the right decisions based on your wishes.  Follow the link provided in your after visit summary or read over the paperwork  we have mailed to you to help you started getting your Advance Directives in place. If you need assistance in completing these, please reach out to us  so that we can help you!  See attachments for Preventive Care and Fall Prevention Tips.

## 2024-06-22 ENCOUNTER — Telehealth: Payer: Self-pay

## 2024-06-22 DIAGNOSIS — L603 Nail dystrophy: Secondary | ICD-10-CM

## 2024-06-22 NOTE — Telephone Encounter (Signed)
 Copied from CRM [id number]. Topic: General - Other >> Jun 21, 2024  4:03 PM Miquel SAILOR wrote: Reason for CRM: Patient girlfriend called to see if he can schedule with podiatry. Let them know info to call insurance to see if he can directly schedule with specialty or he has to go thru his PCP Dr, Amon. They will call back

## 2024-06-22 NOTE — Telephone Encounter (Signed)
 Spoke w/ Pt- he states several of his toe nails are turning black, this has been ongoing for awhile. Denies tingling/numbness in feet or cold to touch. He has an appt already w/ podiatry on 07/01/24

## 2024-06-22 NOTE — Telephone Encounter (Signed)
 Referral placed.

## 2024-06-22 NOTE — Telephone Encounter (Signed)
Okay to place a referral?

## 2024-07-01 ENCOUNTER — Encounter: Payer: Self-pay | Admitting: Podiatry

## 2024-07-01 ENCOUNTER — Ambulatory Visit: Admitting: Podiatry

## 2024-07-01 DIAGNOSIS — M79675 Pain in left toe(s): Secondary | ICD-10-CM

## 2024-07-01 DIAGNOSIS — M79674 Pain in right toe(s): Secondary | ICD-10-CM | POA: Diagnosis not present

## 2024-07-01 DIAGNOSIS — E1142 Type 2 diabetes mellitus with diabetic polyneuropathy: Secondary | ICD-10-CM

## 2024-07-01 DIAGNOSIS — B351 Tinea unguium: Secondary | ICD-10-CM

## 2024-07-01 NOTE — Progress Notes (Signed)
  Subjective:  Patient ID: [name], male    DOB: 10/27/1931,   MRN: [id number]  Chief Complaint  Patient presents with   Diabetes    My toenails are turning back.  On the ball of my feet, it feels like I am walking on cotton.  Saw Dr. Amon - 04/21/2024; A1c - 6.9    89 y.o. male presents for concern of toenails turning black and ball of his feet feel like cotton. Also concern of thickened elongated and painful nails that are difficult to trim. Requesting to have them trimmed today. Relates burning and tingling in their feet. Patient is diabetic and last A1c was  Lab Results  Component Value Date   HGBA1C 6.9 (H) 04/21/2024   .   PCP:  Amon Aloysius BRAVO, MD    . Denies any other pedal complaints. Denies n/v/f/c.   Past Medical History:  Diagnosis Date   Anemia    Antral gastritis 2013   EGD    Atrial fibrillation (HCC)    CAD (coronary artery disease)    s/p CABG 1993 (L-LAD, S-RI, S-D1);   echo 1/10: EF 55%;    myoview  12/09: inf MI, no ischemia   Candida esophagitis (HCC) 2013   EGD    Cataracts, bilateral    Family history of malignant neoplasm of gastrointestinal tract    Folliculitis    Hiatal hernia    HTN (hypertension)    Hyperlipidemia    Irritable bladder    Myocardial infarction (HCC)    Obesity    Osteoarthritis     Objective:  Physical Exam: Vascular: DP/PT pulses 2/4 bilateral. CFT <3 seconds. Absent hair growth on digits. Edema noted to bilateral lower extremities. Xerosis noted bilaterally.  Skin. No lacerations or abrasions bilateral feet. Nails 1-5 bilateral  are thickened discolored and elongated with subungual debris.  Musculoskeletal: MMT 5/5 bilateral lower extremities in DF, PF, Inversion and Eversion. Deceased ROM in DF of ankle joint.  Neurological: Sensation intact to light touch. Protective sensation diminished bilateral.    Assessment:   1. Pain due to onychomycosis of toenails of both feet   2. Type 2 diabetes mellitus with peripheral  neuropathy (HCC)      Plan:  Patient was evaluated and treated and all questions answered. -Discussed and educated patient on diabetic foot care, especially with  regards to the vascular, neurological and musculoskeletal systems.  -Stressed the importance of good glycemic control and the detriment of not  controlling glucose levels in relation to the foot. -Discussed supportive shoes at all times and checking feet regularly.  -Mechanically debrided all nails 1-5 bilateral using sterile nail nipper and filed with dremel without incident  -Answered all patient questions -Patient to return  in 3 months for at risk foot care -Patient advised to call the office if any problems or questions arise in the meantime.   Asberry Failing, DPM

## 2024-07-16 ENCOUNTER — Other Ambulatory Visit: Payer: Self-pay | Admitting: Internal Medicine

## 2024-07-16 DIAGNOSIS — I10 Essential (primary) hypertension: Secondary | ICD-10-CM

## 2024-07-26 ENCOUNTER — Encounter: Payer: Self-pay | Admitting: Internal Medicine

## 2024-07-26 ENCOUNTER — Ambulatory Visit (INDEPENDENT_AMBULATORY_CARE_PROVIDER_SITE_OTHER): Admitting: Internal Medicine

## 2024-07-26 VITALS — BP 136/62 | HR 61 | Temp 97.6°F | Resp 16 | Ht 71.0 in | Wt 215.1 lb

## 2024-07-26 DIAGNOSIS — N1832 Chronic kidney disease, stage 3b: Secondary | ICD-10-CM

## 2024-07-26 DIAGNOSIS — I4821 Permanent atrial fibrillation: Secondary | ICD-10-CM

## 2024-07-26 DIAGNOSIS — I4891 Unspecified atrial fibrillation: Secondary | ICD-10-CM | POA: Diagnosis not present

## 2024-07-26 DIAGNOSIS — I5042 Chronic combined systolic (congestive) and diastolic (congestive) heart failure: Secondary | ICD-10-CM

## 2024-07-26 DIAGNOSIS — Z7185 Encounter for immunization safety counseling: Secondary | ICD-10-CM | POA: Diagnosis not present

## 2024-07-26 DIAGNOSIS — Z09 Encounter for follow-up examination after completed treatment for conditions other than malignant neoplasm: Secondary | ICD-10-CM

## 2024-07-26 DIAGNOSIS — C7989 Secondary malignant neoplasm of other specified sites: Secondary | ICD-10-CM | POA: Insufficient documentation

## 2024-07-26 DIAGNOSIS — I10 Essential (primary) hypertension: Secondary | ICD-10-CM

## 2024-07-26 DIAGNOSIS — E1159 Type 2 diabetes mellitus with other circulatory complications: Secondary | ICD-10-CM

## 2024-07-26 DIAGNOSIS — E1140 Type 2 diabetes mellitus with diabetic neuropathy, unspecified: Secondary | ICD-10-CM

## 2024-07-26 LAB — HEMOGLOBIN A1C: Hgb A1c MFr Bld: 7.1 % — ABNORMAL HIGH (ref 4.6–6.5)

## 2024-07-26 MED ORDER — SOLIFENACIN SUCCINATE 5 MG PO TABS: 5.0000 mg | ORAL_TABLET | Freq: Every day | ORAL | 3 refills | Status: DC

## 2024-07-26 NOTE — Assessment & Plan Note (Signed)
 DM: Last A1c very good at 6.9, on Rybelsus .  Recheck A1c. HTN: Reports good ambulatory BPs although he could not tell me any readings.  Continue Lotensin , Lasix , metoprolol .  Last BMP okay. LUTS: See LOV, PSA urine culture essentially negative.  Continue with urinary frequency.  Not inclined to see urology.  Plan: Trial with Vesicare , see AVS. A-fib: Anticoagulated without apparent problems. Social: Still playing golf sometimes Recommend the flu and a COVID booster this fall RTC 4 months

## 2024-07-26 NOTE — Progress Notes (Unsigned)
 Subjective:    Patient ID: [name], male    DOB: 1931/01/07, 89 y.o.   MRN: [id number]  DOS:  07/26/2024 Type of visit - description: f/u  Follow-up. Chronic medical problems addressed. Ambulatory BPs when checked --- normal. On Eliquis , denies nausea vomiting or blood in the stools. No change in the color of the stools or urine. Continue with urinary frequency Very seldom has quick pain at the right side of the abdomen, last few seconds.  Review of Systems See above   Past Medical History:  Diagnosis Date   Anemia    Antral gastritis 2013   EGD    Atrial fibrillation (HCC)    CAD (coronary artery disease)    s/p CABG 1993 (L-LAD, S-RI, S-D1);   echo 1/10: EF 55%;    myoview  12/09: inf MI, no ischemia   Candida esophagitis (HCC) 2013   EGD    Cataracts, bilateral    Family history of malignant neoplasm of gastrointestinal tract    Folliculitis    Hiatal hernia    HTN (hypertension)    Hyperlipidemia    Irritable bladder    Myocardial infarction [facility])    Obesity    Osteoarthritis     Past Surgical History:  Procedure Laterality Date   BLEPHAROPLASTY Bilateral 11/16/2021   upper lid   CATARACT EXTRACTION Right 07/21/2018   CHOLECYSTECTOMY     laparoscopic '92   CORONARY ARTERY BYPASS GRAFT     graft '92: LIMA-LAD, SVG - D2,R1, D1   ENTEROSCOPY  08/22/2012   Procedure: ENTEROSCOPY;  Surgeon: Renaye Sous, MD;  Location: [facility];  Service: Endoscopy;  Laterality: N/A;   INTRAOCULAR LENS EXCHANGE Right 07/21/2018   KNEE SURGERY     MOLE REMOVAL  2001 and 2008   PAROTIDECTOMY Right 06/18/2021   PILONIDAL CYST EXCISION      Current Outpatient Medications  Medication Instructions   acetaminophen  (TYLENOL ) 650 mg, Oral, 2 times daily   apixaban  (ELIQUIS ) 2.5 MG TABS tablet TAKE 1 TABLET(2.5 MG) BY MOUTH TWICE DAILY   atorvastatin  (LIPITOR) 40 MG tablet TAKE 1 TABLET(40 MG) BY MOUTH DAILY   benazepril  (LOTENSIN ) 20 mg, Oral, Daily   Coenzyme Q10 (COQ10)  100 MG CAPS 1 capsule, Daily   furosemide  (LASIX ) 40 mg, Oral, Daily   Melatonin 10 mg, Daily at bedtime   metoprolol  succinate (TOPROL -XL) 25 mg, Oral, Daily, Take with or immediately following a meal   Multiple Vitamin (MULTIVITAMIN WITH MINERALS) TABS tablet 1 tablet, Daily   Rybelsus  7 mg, Oral, Daily   tamsulosin  (FLOMAX ) 0.4 mg, Oral, Daily after supper       Objective:   Physical Exam BP 136/62   Pulse 61   Temp 97.6 F (36.4 C) (Oral)   Resp 16   Ht 5' 11 (1.803 m)   Wt 215 lb 2 oz (97.6 kg)   SpO2 94%   BMI 30.00 kg/m  General:   Well developed, NAD, BMI noted. HEENT:  Normocephalic . Face symmetric, atraumatic Lungs:  CTA B Normal respiratory effort, no intercostal retractions, no accessory muscle use. Heart: Regular?   Lower extremities: no pretibial edema bilaterally  Skin: Not pale. Not jaundice Neurologic:  alert & oriented X3.  Speech normal, gait appropriate for age and unassisted Psych--  Cognition and judgment appear intact.  Cooperative with normal attention span and concentration.  Behavior appropriate. No anxious or depressed appearing.      Assessment     Problem list: DM:+ mild neuropathy.  avoiding Metformin -CKD. Avoiding Actos d/t h/o  CHF.  ; Farxiga  12-2022 (balanitis) HTN Hyperlipidemia CKD: Chronic, CT abdomen 2021: No  obstruction, bilateral cortical thinning. CV: --CAD, CABG 1993, Myoview  2009 no ischemia. --Atrial fibrillation -- rate control, xarelto  -- chronic combined systolic/diastolic congestive heart failure Venous insufficiency, mild LE edema L>>R  LUTS    MSK: DJD, spinal stenosis, R wrist Fx in the 80s GI: --Recurrent melena: Work-up 2013 Dr. Jakie: Colonoscopy, EGD, capsule endoscopy.  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   Recheck A1c. HTN: Reports good ambulatory BPs although he could not tell me any readings.  Continue Lotensin , Lasix , metoprolol .  Last BMP okay. LUTS: See LOV, PSA urine culture essentially negative.  Continue with urinary frequency.  Not inclined to see urology.  Plan: Trial with Vesicare , see AVS. A-fib: Anticoagulated without apparent problems. Social: Still playing golf sometimes Recommend the flu and a COVID booster this fall RTC 4 months

## 2024-07-26 NOTE — Patient Instructions (Addendum)
 For the problem with frequent urination: Hold tamsulosin  (Flomax ). Start Vesicare  5 mg 1 tablet daily If that helps with the urinary problems you can continue taking Vesicare . If it does not, then go back on tamsulosin .  Continue checking your blood pressure regularly Blood pressure goal:  between 110/65 and  135/85. If it is consistently higher or lower, let me know     GO TO THE LAB :  Get the blood work   Your results will be posted on MyChart with my comments  Next office visit for a checkup in 4 months Please make an appointment before you leave today

## 2024-07-27 ENCOUNTER — Ambulatory Visit: Payer: Self-pay | Admitting: Internal Medicine

## 2024-08-10 ENCOUNTER — Encounter: Payer: Self-pay | Admitting: Sports Medicine

## 2024-08-19 ENCOUNTER — Encounter: Payer: Self-pay | Admitting: Podiatry

## 2024-08-19 ENCOUNTER — Ambulatory Visit: Admitting: Podiatry

## 2024-08-19 DIAGNOSIS — B351 Tinea unguium: Secondary | ICD-10-CM | POA: Diagnosis not present

## 2024-08-19 DIAGNOSIS — M79675 Pain in left toe(s): Secondary | ICD-10-CM | POA: Diagnosis not present

## 2024-08-19 DIAGNOSIS — E1142 Type 2 diabetes mellitus with diabetic polyneuropathy: Secondary | ICD-10-CM | POA: Diagnosis not present

## 2024-08-19 DIAGNOSIS — M79674 Pain in right toe(s): Secondary | ICD-10-CM

## 2024-08-19 NOTE — Progress Notes (Signed)
  Subjective:  Patient ID: [name], male    DOB: 03/28/1931,   MRN: [id number]  Chief Complaint  Patient presents with   Diabetes    I have this place on the side of my toe. I have Neuropathy.  Cut the toenails.  Saw Dr. Aloysius Mech - 07/26/2024; A1c - 7.1    89 y.o. male presents for concern of  spot on the inside of his left toe. Also concern of thickened elongated and painful nails that are difficult to trim. Requesting to have them trimmed today. Relates burning and tingling in their feet. Patient is diabetic and last A1c was  Lab Results  Component Value Date   HGBA1C 7.1 (H) 07/26/2024   .   PCP:  Mech Aloysius BRAVO, MD    . Denies any other pedal complaints. Denies n/v/f/c.   Past Medical History:  Diagnosis Date   Anemia    Antral gastritis 2013   EGD    Atrial fibrillation (HCC)    CAD (coronary artery disease)    s/p CABG 1993 (L-LAD, S-RI, S-D1);   echo 1/10: EF 55%;    myoview  12/09: inf MI, no ischemia   Candida esophagitis (HCC) 2013   EGD    Cataracts, bilateral    Family history of malignant neoplasm of gastrointestinal tract    Folliculitis    Hiatal hernia    HTN (hypertension)    Hyperlipidemia    Irritable bladder    Myocardial infarction (HCC)    Obesity    Osteoarthritis     Objective:  Physical Exam: Vascular: DP/PT pulses 2/4 bilateral. CFT <3 seconds. Absent hair growth on digits. Edema noted to bilateral lower extremities. Xerosis noted bilaterally.  Skin. No lacerations or abrasions bilateral feet. Nails 1-5 bilateral  are thickened discolored and elongated with subungual debris.  Hyperkeratotic lesion noted medial second left digit.  Musculoskeletal: MMT 5/5 bilateral lower extremities in DF, PF, Inversion and Eversion. Deceased ROM in DF of ankle joint.  Neurological: Sensation intact to light touch. Protective sensation diminished bilateral.    Assessment:   1. Pain due to onychomycosis of toenails of both feet   2. Type 2 diabetes mellitus  with peripheral neuropathy (HCC)       Plan:  Patient was evaluated and treated and all questions answered. -Here early for treatment. ABN signed.  -Discussed and educated patient on diabetic foot care, especially with  regards to the vascular, neurological and musculoskeletal systems.  -Stressed the importance of good glycemic control and the detriment of not  controlling glucose levels in relation to the foot. -Discussed supportive shoes at all times and checking feet regularly.  -Mechanically debrided all nails 1-5 bilateral using sterile nail nipper and filed with dremel without incident  -Hyperkeratotic lesion debrided with chisel without incident to left second digit as courtesy.  -Answered all patient questions -Patient to return  in 3 months for at risk foot care -Patient advised to call the office if any problems or questions arise in the meantime.   Asberry Failing, DPM

## 2024-08-24 DIAGNOSIS — H02403 Unspecified ptosis of bilateral eyelids: Secondary | ICD-10-CM | POA: Diagnosis not present

## 2024-08-24 DIAGNOSIS — H43813 Vitreous degeneration, bilateral: Secondary | ICD-10-CM | POA: Diagnosis not present

## 2024-08-24 DIAGNOSIS — H02831 Dermatochalasis of right upper eyelid: Secondary | ICD-10-CM | POA: Diagnosis not present

## 2024-08-24 DIAGNOSIS — H02135 Senile ectropion of left lower eyelid: Secondary | ICD-10-CM | POA: Diagnosis not present

## 2024-08-24 DIAGNOSIS — H02834 Dermatochalasis of left upper eyelid: Secondary | ICD-10-CM | POA: Diagnosis not present

## 2024-08-24 DIAGNOSIS — H526 Other disorders of refraction: Secondary | ICD-10-CM | POA: Diagnosis not present

## 2024-08-24 DIAGNOSIS — H02132 Senile ectropion of right lower eyelid: Secondary | ICD-10-CM | POA: Diagnosis not present

## 2024-08-24 DIAGNOSIS — E119 Type 2 diabetes mellitus without complications: Secondary | ICD-10-CM | POA: Diagnosis not present

## 2024-08-24 DIAGNOSIS — H26493 Other secondary cataract, bilateral: Secondary | ICD-10-CM | POA: Diagnosis not present

## 2024-08-24 LAB — HM DIABETES EYE EXAM

## 2024-08-24 NOTE — Progress Notes (Signed)
 HPI: FU coronary artery disease and atrial fibrillation. Patient is status post coronary artery bypassing graft in 1993 with LIMA to the LAD, saphenous vein graft to the ramus intermedius and saphenous vein graft to the first diagonal. CT of the abdomen in January of 2009 showed no renal artery stenosis. There was no aortic aneurysm. Nuclear study in June 2014 showed scar in the mid/apical inferior wall and apex. There was a small area of ischemia in the base to mid anterior wall. The study was unchanged compared to May of 2012. The study was not gated. We have treated medically.  Echocardiogram September 2022 showed ejection fraction 40 to 45%, mild to moderate left ventricular enlargement, mild left ventricular hypertrophy, mild right ventricular enlargement, moderate left atrial enlargement, mild mitral regurgitation, mild to moderate tricuspid regurgitation and mildly dilated ascending aorta at 41 mm.  Since last seen, patient denies dyspnea, chest pain, palpitations or syncope.  No bleeding.  Current Outpatient Medications  Medication Sig Dispense Refill   acetaminophen  (TYLENOL ) 650 MG CR tablet Take 1 tablet (650 mg total) by mouth in the morning and at bedtime. 90 tablet 3   apixaban  (ELIQUIS ) 2.5 MG TABS tablet TAKE 1 TABLET(2.5 MG) BY MOUTH TWICE DAILY 180 tablet 3   atorvastatin  (LIPITOR) 40 MG tablet TAKE 1 TABLET(40 MG) BY MOUTH DAILY 90 tablet 2   benazepril  (LOTENSIN ) 20 MG tablet Take 1 tablet (20 mg total) by mouth daily. 90 tablet 1   Coenzyme Q10 (COQ10) 100 MG CAPS Take 1 capsule by mouth daily.      furosemide  (LASIX ) 40 MG tablet Take 1 tablet (40 mg total) by mouth daily. 90 tablet 1   Melatonin 10 MG TABS Take 10 mg by mouth at bedtime.      metoprolol  succinate (TOPROL -XL) 25 MG 24 hr tablet Take 1 tablet (25 mg total) by mouth daily. Take with or immediately following a meal 90 tablet 1   Multiple Vitamin (MULTIVITAMIN WITH MINERALS) TABS tablet Take 1 tablet by mouth  daily.     Semaglutide  (RYBELSUS ) 7 MG TABS Take 1 tablet (7 mg total) by mouth daily. 90 tablet 1   solifenacin  (VESICARE ) 5 MG tablet Take 1 tablet (5 mg total) by mouth daily. 30 tablet 3   tamsulosin  (FLOMAX ) 0.4 MG CAPS capsule Take 1 capsule (0.4 mg total) by mouth daily after supper. 90 capsule 1   No current facility-administered medications for this visit.     Past Medical History:  Diagnosis Date   Anemia    Antral gastritis 2013   EGD    Atrial fibrillation (HCC)    CAD (coronary artery disease)    s/p CABG 1993 (L-LAD, S-RI, S-D1);   echo 1/10: EF 55%;    myoview  12/09: inf MI, no ischemia   Candida esophagitis (HCC) 2013   EGD    Cataracts, bilateral    Family history of malignant neoplasm of gastrointestinal tract    Folliculitis    Hiatal hernia    HTN (hypertension)    Hyperlipidemia    Irritable bladder    Myocardial infarction [facility])    Obesity    Osteoarthritis     Past Surgical History:  Procedure Laterality Date   BLEPHAROPLASTY Bilateral 11/16/2021   upper lid   CATARACT EXTRACTION Right 07/21/2018   CHOLECYSTECTOMY     laparoscopic '92   CORONARY ARTERY BYPASS GRAFT     graft '92: LIMA-LAD, SVG - D2,R1, D1   ENTEROSCOPY  08/22/2012  Procedure: ENTEROSCOPY;  Surgeon: Renaye Sous, MD;  Location: [facility];  Service: Endoscopy;  Laterality: N/A;   INTRAOCULAR LENS EXCHANGE Right 07/21/2018   KNEE SURGERY     MOLE REMOVAL  2001 and 2008   PAROTIDECTOMY Right 06/18/2021   PILONIDAL CYST EXCISION      Social History   Socioeconomic History   Marital status: Widowed    Spouse name: Not on file   Number of children: 1   Years of education: Not on file   Highest education level: Not on file  Occupational History   Occupation: Retired    Associate Professor: RETIRED  Tobacco Use   Smoking status: Former    Current packs/day: 0.00    Types: Cigarettes    Start date: 08/14/1948    Quit date: 08/14/1964    Years since quitting: 60.0   Smokeless  tobacco: Never   Tobacco comments:    quit 1965  Vaping Use   Vaping status: Never Used  Substance and Sexual Activity   Alcohol use: Not Currently    Alcohol/week: 7.0 standard drinks of alcohol    Types: 7 Shots of liquor per week   Drug use: No   Sexual activity: Not Currently  Other Topics Concern   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 Married '57- widowed 08/14/24. 1 son -'52- died '08/21/2024 MVA.    Lives w/ girlfriend     Emergency contact: see DPR, brother and  Ronnald Gaw [phone] (friend)   Social Drivers of Health   Financial Resource Strain: Low Risk  (06/18/2021)   Received from [facility]   Overall Financial Resource Strain (CARDIA)    Difficulty of Paying Living Expenses: Not hard at all  Food Insecurity: No Food Insecurity (06/18/2024)   Hunger Vital Sign    Worried About Running Out of Food in the Last Year: Never true    Ran Out of Food in the Last Year: Never true  Transportation Needs: No Transportation Needs (08/01/2021)   PRAPARE - Administrator, Civil Service (Medical): No    Lack of Transportation (Non-Medical): No  Physical Activity: Sufficiently Active (06/18/2024)   Exercise Vital Sign    Days of Exercise per Week: 6 days    Minutes of Exercise per Session: 80 min  Stress: No Stress Concern Present (06/18/2024)   [employer] of Occupational Health - Occupational Stress Questionnaire    Feeling of Stress: Not at all  Social Connections: Moderately Isolated (06/18/2024)   Social Connection and Isolation Panel    Frequency of Communication with Friends and Family: Three times a week    Frequency of Social Gatherings with Friends and Family: Twice a week    Attends Religious Services: Never    Database administrator or Organizations: Yes    Attends Engineer, structural: More than 4 times per year    Marital Status: Widowed  Intimate Partner Violence: Not At Risk (06/18/2024)    Humiliation, Afraid, Rape, and Kick questionnaire    Fear of Current or Ex-Partner: No    Emotionally Abused: No    Physically Abused: No    Sexually Abused: No    Family History  Problem Relation Age of Onset   Coronary artery disease Father        and brother   Heart attack Father        and brother-fatal   Stroke Father  Breast cancer Mother    Alzheimer's disease Mother    Colon cancer Maternal Uncle    Esophageal cancer Neg Hx    Stomach cancer Neg Hx    Rectal cancer Neg Hx    Prostate cancer Neg Hx     ROS: no fevers or chills, productive cough, hemoptysis, dysphasia, odynophagia, melena, hematochezia, dysuria, hematuria, rash, seizure activity, orthopnea, PND, pedal edema, claudication. Remaining systems are negative.  Physical Exam: Well-developed well-nourished in no acute distress.  Skin is warm and dry.  HEENT is normal.  Neck is supple.  Chest is clear to auscultation with normal expansion.  Cardiovascular exam is irregular Abdominal exam nontender or distended. No masses palpated. Extremities show no edema. neuro grossly intact  EKG Interpretation Date/Time:  Monday August 30 2024 15:28:41 EDT Ventricular Rate:  62 PR Interval:    QRS Duration:  160 QT Interval:  482 QTC Calculation: 489 R Axis:   -49  Text Interpretation: Atrial fibrillation with premature ventricular or aberrantly conducted complexes Right bundle branch block Left anterior fascicular block Bifascicular block Confirmed by Pietro Rogue ([id number]) on 08/30/2024 3:32:35 PM    A/P  1 permanent atrial fibrillation-patient will continue Toprol  at present dose for rate control.  Continue apixaban .  2 chronic combined systolic/diastolic congestive heart failure-patient will continue diuretic at present dose.  SGLT2 inhibitor discontinued previously secondary to perineal infection.  3 hypertension-blood pressure elevated.  However he states it is controlled at home.  Continue present  medications and follow.  4 hyperlipidemia-continue statin.  5 coronary artery disease-patient denies chest pain.  Continue statin.  Rogue Pietro, MD

## 2024-08-30 ENCOUNTER — Encounter: Payer: Self-pay | Admitting: Cardiology

## 2024-08-30 ENCOUNTER — Ambulatory Visit: Admitting: Cardiology

## 2024-08-30 VITALS — BP 154/76 | HR 62 | Ht 71.0 in | Wt 213.0 lb

## 2024-08-30 DIAGNOSIS — E7800 Pure hypercholesterolemia, unspecified: Secondary | ICD-10-CM

## 2024-08-30 DIAGNOSIS — I10 Essential (primary) hypertension: Secondary | ICD-10-CM | POA: Diagnosis not present

## 2024-08-30 DIAGNOSIS — I5042 Chronic combined systolic (congestive) and diastolic (congestive) heart failure: Secondary | ICD-10-CM | POA: Diagnosis not present

## 2024-08-30 DIAGNOSIS — I2510 Atherosclerotic heart disease of native coronary artery without angina pectoris: Secondary | ICD-10-CM

## 2024-08-30 DIAGNOSIS — I4821 Permanent atrial fibrillation: Secondary | ICD-10-CM | POA: Diagnosis not present

## 2024-08-30 NOTE — Patient Instructions (Signed)

## 2024-09-08 ENCOUNTER — Other Ambulatory Visit: Payer: Self-pay | Admitting: Internal Medicine

## 2024-09-10 ENCOUNTER — Other Ambulatory Visit: Payer: Self-pay | Admitting: Cardiology

## 2024-09-12 ENCOUNTER — Other Ambulatory Visit: Payer: Self-pay | Admitting: Cardiology

## 2024-09-13 ENCOUNTER — Ambulatory Visit
Admission: EM | Admit: 2024-09-13 | Discharge: 2024-09-13 | Disposition: A | Discharge status: Home / Self Care | Attending: Family Medicine | Admitting: Family Medicine

## 2024-09-13 ENCOUNTER — Encounter: Payer: Self-pay | Admitting: Emergency Medicine

## 2024-09-13 DIAGNOSIS — M25572 Pain in left ankle and joints of left foot: Secondary | ICD-10-CM

## 2024-09-13 NOTE — ED Triage Notes (Signed)
 Patient c/o left ankle swelling and pain x 5 days.  Denies any injury.  Denies any OTC pain meds.

## 2024-09-13 NOTE — Discharge Instructions (Signed)
-   Ice the ankle 2-3 times per day, raise the ankle at night to help with swelling - Let pain be your guide for physical activity. If you are having pain, reduce your activity that day - Follow-up in 1-2 weeks with PCP or our clinic if not improving. We can consider X-rays at that time.

## 2024-09-13 NOTE — ED Provider Notes (Signed)
 [name] CARE    CSN: [id number] Arrival date & time: 09/13/24  1042      History   Chief Complaint Chief Complaint  Patient presents with   Ankle Pain    HPI [name] is a 89 y.o. male pain began 5 days ago after golfing.  Denies falling, trauma.  Was wearing old shoes that day.  Pain initially in bilateral ankles, now only present in the left ankle.  Pain is lateral and anterior.  Ambulating without issues.  Pain is dull and aching.   Ankle Pain   Past Medical History:  Diagnosis Date   Anemia    Antral gastritis 2013   EGD    Atrial fibrillation (HCC)    CAD (coronary artery disease)    s/p CABG 1993 (L-LAD, S-RI, S-D1);   echo 1/10: EF 55%;    myoview  12/09: inf MI, no ischemia   Candida esophagitis (HCC) 2013   EGD    Cataracts, bilateral    Family history of malignant neoplasm of gastrointestinal tract    Folliculitis    Hiatal hernia    HTN (hypertension)    Hyperlipidemia    Irritable bladder    Myocardial infarction [facility])    Obesity    Osteoarthritis     Patient Active Problem List   Diagnosis Date Noted   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) 07/26/2024   CKD (chronic kidney disease) stage 3, GFR 30-59 ml/min (HCC) 07/01/2022   Lymphadenopathy, inguinal, right 12/17/2021   Primary osteoarthritis of right knee 07/10/2021   Lumbar spondylosis 04/16/2019   CHF (congestive heart failure) (HCC) 12/17/2016   PCP NOTES >>>>> 10/11/2015   Carpal tunnel syndrome 02/15/2014   Anemia 08/14/2012   Annual physical exam 01/08/2012   Atrial fibrillation (HCC) 03/29/2011   Venous (peripheral) insufficiency 09/04/2009   OBESITY 07/04/2009   DJD (degenerative joint disease) 07/04/2009   SLEEP DISORDER 07/04/2009   DM II (diabetes mellitus, type II), controlled (HCC) 04/25/2008   Hyperlipidemia 06/25/2007   Essential hypertension 06/25/2007   CAD (coronary artery disease) of artery bypass graft  06/25/2007    Past Surgical History:  Procedure Laterality Date   BLEPHAROPLASTY Bilateral 11/16/2021   upper lid   CATARACT EXTRACTION Right 07/21/2018   CHOLECYSTECTOMY     laparoscopic '92   CORONARY ARTERY BYPASS GRAFT     graft '92: LIMA-LAD, SVG - D2,R1, D1   ENTEROSCOPY  08/22/2012   Procedure: ENTEROSCOPY;  Surgeon: Clayton Sous, [name];  Location: [facility];  Service: Endoscopy;  Laterality: N/A;   INTRAOCULAR LENS EXCHANGE Right 07/21/2018   KNEE SURGERY     MOLE REMOVAL  2001 and 2008   PAROTIDECTOMY Right 06/18/2021   PILONIDAL CYST EXCISION         Home Medications    Prior to Admission medications   Medication Sig Start Date End Date Taking? Authorizing Provider  acetaminophen  (TYLENOL ) 650 MG CR tablet Take 1 tablet (650 mg total) by mouth in the morning and at bedtime. 03/18/22  Yes Clayton Debby PARAS, [name]  apixaban  (ELIQUIS ) 2.5 MG TABS tablet TAKE 1 TABLET(2.5 MG) BY MOUTH TWICE DAILY 05/14/24  Yes Clayton Redell RAMAN, [name]  atorvastatin  (LIPITOR) 40 MG tablet TAKE 1 TABLET(40 MG) BY MOUTH DAILY 09/10/24  Yes Clayton Redell RAMAN, [name]  benazepril  (LOTENSIN ) 20 MG tablet Take 1 tablet (20 mg total) by mouth daily. 07/16/24  Yes [name]  Coenzyme Q10 (COQ10) 100 MG CAPS  Take 1 capsule by mouth daily.    Yes Provider, Historical, [name]  furosemide  (LASIX ) 40 MG tablet Take 1 tablet (40 mg total) by mouth daily. 09/09/24  Yes [name]  Melatonin 10 MG TABS Take 10 mg by mouth at bedtime.    Yes Provider, Historical, [name]  metoprolol  succinate (TOPROL -XL) 25 MG 24 hr tablet Take 1 tablet (25 mg total) by mouth daily. Take with or immediately following a meal 01/06/24  Yes [name]  Multiple Vitamin (MULTIVITAMIN WITH MINERALS) TABS tablet Take 1 tablet by mouth daily.   Yes Provider, Historical, [name]  Semaglutide  (RYBELSUS ) 7 MG TABS Take 1 tablet (7 mg total) by mouth daily. 09/01/23  Yes [name]  solifenacin  (VESICARE ) 5 MG tablet Take 1 tablet (5 mg total)  by mouth daily. 07/26/24  Yes [name]  tamsulosin  (FLOMAX ) 0.4 MG CAPS capsule Take 1 capsule (0.4 mg total) by mouth daily after supper. 05/25/24  Yes [name]    Family History Family History  Problem Relation Age of Onset   Coronary artery disease Father        and brother   Heart attack Father        and brother-fatal   Stroke Father    Breast cancer Mother    Alzheimer's disease Mother    Colon cancer Maternal Uncle    Esophageal cancer Neg Hx    Stomach cancer Neg Hx    Rectal cancer Neg Hx    Prostate cancer Neg Hx     Social History Social History   Tobacco Use   Smoking status: Former    Current packs/day: 0.00    Types: Cigarettes    Start date: 08/14/1948    Quit date: 08/14/1964    Years since quitting: 60.1   Smokeless tobacco: Never   Tobacco comments:    quit 1965  Vaping Use   Vaping status: Never Used  Substance Use Topics   Alcohol use: Not Currently    Alcohol/week: 7.0 standard drinks of alcohol    Types: 7 Shots of liquor per week   Drug use: No     Allergies   Patient has no known allergies.   Review of Systems Review of Systems   Physical Exam Triage Vital Signs ED Triage Vitals  Encounter Vitals Group     BP 09/13/24 1055 (!) 141/68     Girls Systolic BP Percentile --      Girls Diastolic BP Percentile --      Boys Systolic BP Percentile --      Boys Diastolic BP Percentile --      Pulse Rate 09/13/24 1055 (!) 49     Resp 09/13/24 1055 18     Temp 09/13/24 1055 98.3 F (36.8 C)     Temp Source 09/13/24 1055 Oral     SpO2 09/13/24 1055 95 %     Weight 09/13/24 1054 212 lb (96.2 kg)     Height 09/13/24 1054 5' 11 (1.803 m)     Head Circumference --      Peak Flow --      Pain Score 09/13/24 1053 5     Pain Loc --      Pain Education --      Exclude from Growth Chart --    No data found.  Updated Vital Signs BP (!) 141/68 (BP Location: Right Arm)   Pulse (!) 49   Temp  98.3 F (36.8 C) (Oral)   Resp 18    Ht 5' 11 (1.803 m)   Wt 212 lb (96.2 kg)   SpO2 95%   BMI 29.57 kg/m   Visual Acuity Right Eye Distance:   Left Eye Distance:   Bilateral Distance:    Right Eye Near:   Left Eye Near:    Bilateral Near:     Physical Exam Constitutional:      General: He is not in acute distress.    Appearance: Normal appearance.  Cardiovascular:     Rate and Rhythm: Normal rate and regular rhythm.     Heart sounds: Normal heart sounds.  Pulmonary:     Effort: Pulmonary effort is normal.     Breath sounds: Normal breath sounds.  Abdominal:     General: Abdomen is flat.     Palpations: Abdomen is soft.  Musculoskeletal:     Comments: Left ankle: No gross deformity, edema (bilaterally, chronic), no ecchymosis.  Mild TTP over extensor tendons of anterior compartment.  No tenderness to ATFL.  No laxity with ligamental stress testing.  Compression test negative.  5/5 strength with normal sensation, neurovascularly intact.  Neurological:     Mental Status: He is alert.      UC Treatments / Results  Labs (all labs ordered are listed, but only abnormal results are displayed) Labs Reviewed - No data to display  EKG   Radiology No results found.  Procedures Procedures (including critical care time)  Medications Ordered in UC Medications - No data to display  Initial Impression / Assessment and Plan / UC Course  I have reviewed the triage vital signs and the nursing notes.  Pertinent labs & imaging results that were available during my care of the patient were reviewed by me and considered in my medical decision making (see chart for details).     Well-appearing, [place] ankle rule negative.  Based on exam above, differential includes: Muscle strain of extensor muscles, mild sprain of tibiofibular ligament.  Low concern for fracture without significant trauma.  Low concern for syndesmosis disruption.  Supportive care measures discussed.  Follow-up and return precautions  discussed.  Of note patient noted to be bradycardic.  Longstanding history of bradycardia.  Currently on beta-blocker. He is asymptomatic, exercises frequency.  Follows with cardiology.  Discussed if symptoms recur to go to ED.  Patient and spouse expressed understanding and agreement.  Final Clinical Impressions(s) / UC Diagnoses   Final diagnoses:  Acute left ankle pain     Discharge Instructions      - Ice the ankle 2-3 times per day, raise the ankle at night to help with swelling - Let pain be your guide for physical activity. If you are having pain, reduce your activity that day - Follow-up in 1-2 weeks with PCP or our clinic if not improving. We can consider X-rays at that time.     ED Prescriptions   None    PDMP not reviewed this encounter.   Howell Lunger, DO 09/13/24 1151

## 2024-09-15 DIAGNOSIS — Z86008 Personal history of in-situ neoplasm of other site: Secondary | ICD-10-CM | POA: Diagnosis not present

## 2024-09-15 DIAGNOSIS — L570 Actinic keratosis: Secondary | ICD-10-CM | POA: Diagnosis not present

## 2024-09-15 DIAGNOSIS — Z85828 Personal history of other malignant neoplasm of skin: Secondary | ICD-10-CM | POA: Diagnosis not present

## 2024-09-15 DIAGNOSIS — C44222 Squamous cell carcinoma of skin of right ear and external auricular canal: Secondary | ICD-10-CM | POA: Diagnosis not present

## 2024-09-15 DIAGNOSIS — L821 Other seborrheic keratosis: Secondary | ICD-10-CM | POA: Diagnosis not present

## 2024-09-15 DIAGNOSIS — Z129 Encounter for screening for malignant neoplasm, site unspecified: Secondary | ICD-10-CM | POA: Diagnosis not present

## 2024-09-15 DIAGNOSIS — D485 Neoplasm of uncertain behavior of skin: Secondary | ICD-10-CM | POA: Diagnosis not present

## 2024-09-17 ENCOUNTER — Encounter: Payer: Self-pay | Admitting: Pharmacist

## 2024-09-21 ENCOUNTER — Telehealth: Payer: Self-pay

## 2024-09-21 NOTE — Telephone Encounter (Signed)
 Spoke w/ Pt- made him aware that Rybelsus  7mg  at front desk for pick up at his convenience. Pt verbalized understanding.

## 2024-09-23 DIAGNOSIS — H26493 Other secondary cataract, bilateral: Secondary | ICD-10-CM | POA: Diagnosis not present

## 2024-09-23 DIAGNOSIS — S82832A Other fracture of upper and lower end of left fibula, initial encounter for closed fracture: Secondary | ICD-10-CM | POA: Diagnosis not present

## 2024-09-23 DIAGNOSIS — R52 Pain, unspecified: Secondary | ICD-10-CM | POA: Diagnosis not present

## 2024-10-04 DIAGNOSIS — C4442 Squamous cell carcinoma of skin of scalp and neck: Secondary | ICD-10-CM | POA: Diagnosis not present

## 2024-10-04 DIAGNOSIS — C44222 Squamous cell carcinoma of skin of right ear and external auricular canal: Secondary | ICD-10-CM | POA: Diagnosis not present

## 2024-10-04 DIAGNOSIS — D485 Neoplasm of uncertain behavior of skin: Secondary | ICD-10-CM | POA: Diagnosis not present

## 2024-10-05 ENCOUNTER — Other Ambulatory Visit: Payer: Self-pay | Admitting: Pharmacist

## 2024-10-05 NOTE — Progress Notes (Signed)
 10/05/2024 Name: [name] MRN: [id number] DOB: 24-Feb-1931  Chief Complaint  Patient presents with   Diabetes   Medication Management    [name] is a 89 y.o. year old male.     They were referred to the pharmacist by their PCP for assistance in managing medication access.    Subjective:  Medication Access/Adherence  Current Pharmacy:  [id number] - [place], [place] - [address] AT [facility] [address] [place] [address] Phone: [phone] Fax: [phone]   Patient reports affordability concerns with their medications: Yes  - He usually reaches Medicare Coverage gap and cost of Eliquis  and Rybelsus  is high.  We were able to get Rybelsus  from medication assistance program. He has purchased Eliquis  from [place] in 2025.  Patient reports access/transportation concerns to their pharmacy: No  Patient reports adherence concerns with their medications:  No     Diabetes:  Current medications: Rybelsus  7mg  daily  Previously tried Farxiga  but stopped due to balanitis and rash. No metformin due to CKD (last eGFR was 40)   Patient denies hypoglycemic s/sx including no dizziness, shakiness, sweating. Patient denies hyperglycemic symptoms including no polyuria, polydipsia, polyphagia, nocturia, neuropathy, blurred vision.    Current medication access support: Rybelsus  / Novo Nordisk thru 12/08/2024  Macrovascular and Microvascular Risk Reduction:  Statin? yes (atorvastatin ); ACEi/ARB? yes (benezepril) Last urinary albumin/creatinine ratio:  needed - next appt with PCP in December 2025 Last eye exam:  Lab Results  Component Value Date   HMDIABEYEEXA No Retinopathy 08/07/2023   Last foot exam: 08/12/2023 Tobacco Use:  Tobacco Use: Medium Risk (09/23/2024)   Received from [facility]   Patient History    Smoking Tobacco Use: Former    Smokeless Tobacco Use: Never    Passive Exposure: Not on file     Atrial  Fibrillation:  Current medications: Rate Control: metoprolol  Rhythm Control:  Anticoagulation Regimen: Eliquis  2.5mg  twice a day   Age = 89 yo; weight = 96 kg; Scr = 1.76 Eliquis  dose adjusted based on age > 80 and Scr > 1.5    Objective:  Lab Results  Component Value Date   HGBA1C 7.1 (H) 07/26/2024    Lab Results  Component Value Date   CREATININE 1.76 (H) 04/21/2024   BUN 25 (H) 04/21/2024   NA 140 04/21/2024   K 4.5 04/21/2024   CL 105 04/21/2024   CO2 27 04/21/2024    Lab Results  Component Value Date   CHOL 141 12/09/2023   HDL 36.60 (L) 12/09/2023   LDLCALC 48 12/09/2023   LDLDIRECT 47.0 12/11/2022   TRIG 279.0 (H) 12/09/2023   CHOLHDL 4 12/09/2023    Medications Reviewed Today     Reviewed by [name], RPH-CPP (Pharmacist) on 10/05/24 at 1614  Med List Status: <None>   Medication Order Taking? Sig Documenting Provider Last Dose Status Informant  acetaminophen  (TYLENOL ) 650 MG CR tablet [id number] No Take 1 tablet (650 mg total) by mouth in the morning and at bedtime. Clayton Debby PARAS, [name] 09/13/2024 Active   apixaban  (ELIQUIS ) 2.5 MG TABS tablet [id number] No TAKE 1 TABLET(2.5 MG) BY MOUTH TWICE DAILY Clayton Redell RAMAN, [name] 09/13/2024 Active   atorvastatin  (LIPITOR) 40 MG tablet [id number] No TAKE 1 TABLET(40 MG) BY MOUTH DAILY Clayton Redell RAMAN, [name] 09/13/2024 Active   benazepril  (LOTENSIN ) 20 MG tablet [id number] No Take 1 tablet (20 mg total) by mouth daily. Clayton Aloysius BRAVO, [name]  09/13/2024 Active   Coenzyme Q10 (COQ10) 100 MG CAPS [id number] No Take 1 capsule by mouth daily.  Provider, Historical, [name] 09/13/2024 Active Self  furosemide  (LASIX ) 40 MG tablet [id number] No Take 1 tablet (40 mg total) by mouth daily. Clayton Schanz [name], [name] 09/13/2024 Active   Melatonin 10 MG TABS [id number] No Take 10 mg by mouth at bedtime.  Provider, Historical, [name] 09/13/2024 Active Self  metoprolol  succinate (TOPROL -XL) 25 MG 24 hr tablet [id number] No Take 1 tablet (25 mg total) by mouth  daily. Take with or immediately following a meal Paz, [name] 09/13/2024 Active   Multiple Vitamin (MULTIVITAMIN WITH MINERALS) TABS tablet [id number] No Take 1 tablet by mouth daily. Provider, Historical, [name] 09/13/2024 Active Self  Semaglutide  (RYBELSUS ) 7 MG TABS [id number] No Take 1 tablet (7 mg total) by mouth daily. Clayton Schanz BRAVO, [name] 09/13/2024 Active   solifenacin  (VESICARE ) 5 MG tablet [id number] No Take 1 tablet (5 mg total) by mouth daily. Clayton Schanz BRAVO, [name] 09/13/2024 Active   tamsulosin  (FLOMAX ) 0.4 MG CAPS capsule [id number] No Take 1 capsule (0.4 mg total) by mouth daily after supper. Clayton Schanz BRAVO, [name] 09/13/2024 Active               Assessment/Plan:   Diabetes: A1c at goal of < 7.0%: - Recommend to continue Rybelsus  7mg  daily.  - Discussed that Rybelsus  will not be available from Novo Nordisk for Medicare patient's in 2026. Reviewed [facility] 2026 cost as well as [employer], [facility], [employer] and [employer]. Patient is considering his options. He will use Medicare.gov site and also call insurance plans to research more.  - Patient reports he has eye exam last weekly - called Dr Murdock's office [phone] and requested office visit to be sent to [phone].   Afib: controlled - continue current dose of Eliquis  2.5mg  twice a day and metoprolol  12.5mg  daily   Follow Up Plan: 1 to 2 months to check 2026 med costs.   [name], PharmD Clinical Pharmacist [facility]

## 2024-10-06 ENCOUNTER — Encounter: Payer: Self-pay | Admitting: Internal Medicine

## 2024-10-07 ENCOUNTER — Ambulatory Visit: Admitting: Podiatry

## 2024-10-07 DIAGNOSIS — Z91199 Patient's noncompliance with other medical treatment and regimen due to unspecified reason: Secondary | ICD-10-CM

## 2024-10-07 NOTE — Progress Notes (Signed)
 No show

## 2024-10-29 ENCOUNTER — Telehealth: Payer: Self-pay | Admitting: Internal Medicine

## 2024-10-29 DIAGNOSIS — S82832A Other fracture of upper and lower end of left fibula, initial encounter for closed fracture: Secondary | ICD-10-CM

## 2024-10-29 DIAGNOSIS — W19XXXA Unspecified fall, initial encounter: Secondary | ICD-10-CM

## 2024-10-29 NOTE — Progress Notes (Signed)
 Orthopedic New Patient Outpatient Note  Referring  : No ref. provider found Reason For Visit:   Pain of the Left Ankle (Room 13. Pt states his Left Ankle is in pain. Pt complains of left Calf/Left Ankle pain. Pt wife seems to think due to lifestyle plans while younger, that has damaged his Left Ankle. Pt has had several braces a custom Orthotics. //Pt is having trouble walking. )   History of Present Illness:  [name] is a 89 y.o. male who presents with  Chief Complaint  Patient presents with  . Left Ankle - Pain    Room 13. Pt states his Left Ankle is in pain. Pt complains of left Calf/Left Ankle pain. Pt wife seems to think due to lifestyle plans while younger, that has damaged his Left Ankle. Pt has had several braces a custom Orthotics.   Pt is having trouble walking.    for which Orthopaedic evaluation was requested.  HPI     Pain    Additional comments: Room 13. Pt states his Left Ankle is in pain. Pt complains of left Calf/Left Ankle pain. Pt wife seems to think due to lifestyle plans while younger, that has damaged his Left Ankle. Pt has had several braces a custom Orthotics.   Pt is having trouble walking.       Last edited by Carlin DELENA Moons, CMA on 10/29/2024  4:12 PM.      This is a 89 year old male who comes in today complaining of left ankle pain.  Patient reports he has had a long history of ankle arthritis.  Patient reports he has tried multiple braces in the past it sounds like he has done an AFO as well as his current figure-of-eight ankle brace.  Patient reports he has sustained a fibula shaft fracture at some point in the past he was being treated in a cam walker and he has now come out of this and he would like to return to playing golf.  Patient states prior to his injury he was playing golf 3-4 times a week.  Patient states he tried to put on his figure-of-eight brace and due to the swelling he had in his ankle and lower leg it made it difficult for him to  wear this and he ended up with an abrasion.  Patient is here today to discuss different options.   REVIEW OF SYMPTOMS    The patient denies recent change in weight or energy level, no recent bleeding, itching, rash, or night sweats. Patient denies any headaches, dizziness, or lightheadedness ENT : Patient denies any visual changes or difficulty swallowing Neck : Patient reports no pain or swelling of the neck Chest : Patient denies cough, changes in breathing or respiratory effort. Patient denies change in ambulatory status Abd: Patient denies recent change in bowel or bladder habits  HISTORY   Medical History[1]  Surgical History[2]  Allergies[3]  Prior to Admission medications  Medication Sig Start Date End Date Taking? Authorizing Provider  atorvastatin  (LIPITOR) 10 mg tablet TAKE ONE TABLET BY MOUTH AT BEDTIME 10/16/15   HISTORICAL PROVIDER, CONVERSION  benazepriL  (LOTENSIN ) 40 mg tablet  12/18/15   HISTORICAL PROVIDER, CONVERSION  doxycycline  (VIBRAMYCIN ) 100 mg capsule Take 100 mg by mouth. 11/03/19   HISTORICAL PROVIDER, CONVERSION  furosemide  (LASIX ) 40 mg tablet Take 40 mg by mouth. 12/22/17 03/22/18  HISTORICAL PROVIDER, CONVERSION  metoprolol  succinate (TOPROL  XL) 25 mg 24 hr tablet Take 25 mg by mouth. 09/05/17   HISTORICAL PROVIDER, CONVERSION  multivitamin cap Take  by mouth. 03/06/18   HISTORICAL PROVIDER, CONVERSION  nitroglycerin  (NITROSTAT ) 0.4 mg SL tablet Place  under the tongue. 01/04/15   HISTORICAL PROVIDER, CONVERSION  pantoprazole  (PROTONIX ) 40 mg EC tablet Take 40 mg by mouth. 10/07/17   HISTORICAL PROVIDER, CONVERSION  rivaroxaban  (Xarelto ) 20 mg tablet Take 20 mg by mouth Once Daily. 12/23/15   HISTORICAL PROVIDER, CONVERSION  tamsulosin  (FLOMAX ) 0.4 mg cap Take 0.4 mg by mouth. 12/31/18   HISTORICAL PROVIDER, CONVERSION  traMADoL  (ULTRAM ) 50 mg tablet  04/22/20   HISTORICAL PROVIDER, CONVERSION    Social History[4]  Family History[5]  PHYSICAL EXAMINATION      There is no height or weight on file to calculate BMI.  General: The patient is alert and oriented in no acute Distress. The patient's body habitus is normal and they have appropriate mood and affect. Head : Atraumatic with no evidence of wound.  Sclera and conjunctiva are clear. Neck : Supple with no specific limitation of motion. No jugular venous distention is noted. Chest : Breathing unlabored. No wheezes are appreciated The patient has none dependant edema. Skin: Clean, dry and intact.  No rash or bruising is noted.  Ortho Exam     Imaging: Review of imaging reveals x-rays 3 views of the left ankle from outside facility reveal old fibula distal shaft fracture that has callus formation minimal displacement otherwise patient has osteopenia and ankle arthritic changes with loss of joint space noted X-rays 2 views of the left tib-fib once again reveal old distal fibular shaft fracture otherwise no other fractures noted patient does have what appears to be staples from previous surgical procedure and the medial aspect of the lower leg.  Recent Imaging :  Radiology Results (last 72 hours)     Procedure Component Value Units Date/Time   XR Outside Images Non Result [[id number]] Resulted: 10/29/24 1530   Order Status: Completed Updated: 10/29/24 1530   Narrative:     These images were imported for review and or comparison.       ASSESSMENT / PLAN    Assessment:  .d 1. Arthritis of left ankle   2. Left ankle pain, unspecified chronicity     Plan:   1.  Discussed treatment options with patient in detail at this time patient will begin using Tubigrip we cut him 2 pieces to go underneath his lace up ankle brace he will begin using this with activities.  Patient did not want to proceed with any kind of surgical intervention nor did he want a custom orthotic at this time he states if this does not help his symptoms he may call back to get a custom orthotic order.  All questions  answered to patient's satisfaction.  Orders This  Encounter :  Orders Placed This Encounter  Procedures  . Amb DME Left Ankle AFO Brace    Timothy Jude Rachel, PA-C       [1] Past Medical History: Diagnosis Date  . Age-related nuclear cataract of both eyes   . Arthritis   . Glaucoma suspect of both eyes   . HTN (hypertension)   . Myocardial infarction    (CMD)   . Nonexudative age-related macular degeneration   . PVD (posterior vitreous detachment), both eyes   [2] Past Surgical History: Procedure Laterality Date  . CARDIAC SURGERY     Procedure: CARDIAC SURGERY  . KNEE SURGERY Right    Procedure: KNEE SURGERY  [3] No Known Allergies [4] Social History Socioeconomic History  .  Marital status: Single  Tobacco Use  . Smoking status: Former  . Smokeless tobacco: Never  Substance and Sexual Activity  . Alcohol use: No   Social Drivers of Corporate Investment Banker Strain: Low Risk  (06/18/2021)   Received [facility]   Overall Financial Resource Strain (CARDIA)   . Difficulty of Paying Living Expenses: Not hard at all  Food Insecurity: No Food Insecurity (06/18/2024)   [facility]   Food vital sign   . Within the past 12 months, you worried that your food would run out before you got money to buy more: Never true   . Within the past 12 months, the food you bought just didn't last and you didn't have money to get more: Never true  Transportation Needs: No Transportation Needs (08/01/2021)   Received from [facility]   . Lack of Transportation (Medical): No   . Lack of Transportation (Non-Medical): No  Social Connections: Moderately Isolated (06/18/2024)   Received from [facility]   Social Connection and Isolation Panel   . In a typical week, how many times do you talk on the phone with family, friends, or neighbors?: Three times a week   . How often do you get together with friends or relatives?: Twice a week   .  How often do you attend church or religious services?: Never   . Do you belong to any clubs or organizations such as church groups, unions, fraternal or athletic groups, or school groups?: Yes   . How often do you attend meetings of the clubs or organizations you belong to?: More than 4 times per year   . Are you married, widowed, divorced, separated, never married, or living with a partner?: Widowed  Safety: Not At Risk (06/18/2024)   Received from [facility]   Safety   . Within the last year, have you been afraid of your partner or ex-partner?: No   . Within the last year, have you been humiliated or emotionally abused in other ways by your partner or ex-partner?: No   . Within the last year, have you been kicked, hit, slapped, or otherwise physically hurt by your partner or ex-partner?: No   . Within the last year, have you been raped or forced to have any kind of sexual activity by your partner or ex-partner?: No  Living Situation: Low Risk  (06/18/2024)   Received from [facility] Situation   . In the last 12 months, was there a time when you were not able to pay the mortgage or rent on time?: No   . In the past 12 months, how many times have you moved where you were living?: 0   . At any time in the past 12 months, were you homeless or living in a shelter (including now)?: No  [5] Family History Problem Relation Name Age of Onset  . Diabetes Mother    . Hypertension Mother    . Stroke Father    . Cataracts Father    . Heart disease Brother

## 2024-10-29 NOTE — Telephone Encounter (Signed)
 Copied from CRM #[id number]. Topic: Referral - Request for Referral >> Oct 29, 2024  1:55 PM Ashley R wrote: Did the patient discuss referral with their provider in the last year? No  Appointment offered? No, scheduled for dec 23, but having difficulty walking, risk of fall. Hasn't been seen since fracture. Pt open to coming in earlier  Type of order/referral and detailed reason for visit: Physical Therapy post fall  Preference of office, provider, location: [place] [address]  If referral order, have you been seen by this specialty before? Yes (If Yes, this issue or another issue? When? Where?  Can we respond through MyChart? Yes

## 2024-10-29 NOTE — Telephone Encounter (Signed)
Okay to place PT referral?

## 2024-11-01 ENCOUNTER — Ambulatory Visit: Attending: Family | Admitting: Rehabilitative and Restorative Service Providers"

## 2024-11-01 ENCOUNTER — Encounter: Payer: Self-pay | Admitting: Rehabilitative and Restorative Service Providers"

## 2024-11-01 ENCOUNTER — Other Ambulatory Visit: Payer: Self-pay

## 2024-11-01 DIAGNOSIS — M6281 Muscle weakness (generalized): Secondary | ICD-10-CM | POA: Diagnosis present

## 2024-11-01 DIAGNOSIS — R2689 Other abnormalities of gait and mobility: Secondary | ICD-10-CM | POA: Insufficient documentation

## 2024-11-01 DIAGNOSIS — M25572 Pain in left ankle and joints of left foot: Secondary | ICD-10-CM | POA: Insufficient documentation

## 2024-11-01 DIAGNOSIS — S82832A Other fracture of upper and lower end of left fibula, initial encounter for closed fracture: Secondary | ICD-10-CM | POA: Diagnosis not present

## 2024-11-01 DIAGNOSIS — W19XXXA Unspecified fall, initial encounter: Secondary | ICD-10-CM | POA: Insufficient documentation

## 2024-11-01 NOTE — Telephone Encounter (Signed)
 Per Kenney- okay to place referral. PT referral placed.

## 2024-11-01 NOTE — Therapy (Addendum)
 OUTPATIENT PHYSICAL THERAPY EVALUATION   Patient Name: [name] MRN: [id number] DOB:10/12/1931, 89 y.o., male Today's Date: 11/01/2024  PCP: Aloysius Mech, MD REFERRING PROVIDER: Adine Birmingham, PA  END OF SESSION:  PT End of Session - 11/01/24 1409     Visit Number 1    Number of Visits 16    Date for Recertification  01/20/25    Authorization Type [employer] medicare--auth to submit    PT Start Time 1405    PT Stop Time 1445    PT Time Calculation (min) 40 min    Activity Tolerance Patient limited by pain    Behavior During Therapy [facility] for tasks assessed/performed          Past Medical History:  Diagnosis Date   Anemia    Antral gastritis 2013   EGD    Atrial fibrillation (HCC)    CAD (coronary artery disease)    s/p CABG 1993 (L-LAD, S-RI, S-D1);   echo 1/10: EF 55%;    myoview  12/09: inf MI, no ischemia   Candida esophagitis (HCC) 2013   EGD    Cataracts, bilateral    Family history of malignant neoplasm of gastrointestinal tract    Folliculitis    Hiatal hernia    HTN (hypertension)    Hyperlipidemia    Irritable bladder    Myocardial infarction [facility])    Obesity    Osteoarthritis    Past Surgical History:  Procedure Laterality Date   BLEPHAROPLASTY Bilateral 11/16/2021   upper lid   CATARACT EXTRACTION Right 07/21/2018   CHOLECYSTECTOMY     laparoscopic '92   CORONARY ARTERY BYPASS GRAFT     graft '92: LIMA-LAD, SVG - D2,R1, D1   ENTEROSCOPY  08/22/2012   Procedure: ENTEROSCOPY;  Surgeon: Renaye Sous, MD;  Location: [facility];  Service: Endoscopy;  Laterality: N/A;   INTRAOCULAR LENS EXCHANGE Right 07/21/2018   KNEE SURGERY     MOLE REMOVAL  2001 and 2008   PAROTIDECTOMY Right 06/18/2021   PILONIDAL CYST EXCISION     Patient Active Problem List   Diagnosis Date Noted   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) 07/26/2024   CKD (chronic kidney disease) stage 3, GFR 30-59 ml/min (HCC) 07/01/2022    Lymphadenopathy, inguinal, right 12/17/2021   Primary osteoarthritis of right knee 07/10/2021   Lumbar spondylosis 04/16/2019   CHF (congestive heart failure) (HCC) 12/17/2016   PCP NOTES >>>>> 10/11/2015   Carpal tunnel syndrome 02/15/2014   Anemia 08/14/2012   Annual physical exam 01/08/2012   Atrial fibrillation (HCC) 03/29/2011   Venous (peripheral) insufficiency 09/04/2009   OBESITY 07/04/2009   DJD (degenerative joint disease) 07/04/2009   SLEEP DISORDER 07/04/2009   DM II (diabetes mellitus, type II), controlled (HCC) 04/25/2008   Hyperlipidemia 06/25/2007   Essential hypertension 06/25/2007   CAD (coronary artery disease) of artery bypass graft 06/25/2007    ONSET DATE: 11/01/24  REFERRING DIAG:  T80.CHERENE (ICD-10-CM) - Fall, initial encounter  S82.832A (ICD-10-CM) - Closed avulsion fracture of distal end of left fibula, initial encounter    THERAPY DIAG:  Pain in left ankle and joints of left foot  Other abnormalities of gait and mobility  Muscle weakness (generalized)  Rationale for Evaluation and Treatment: Rehabilitation  SUBJECTIVE:  SUBJECTIVE STATEMENT: The patient had onset of L ankle pain in early September after golfing. He was seen by urgent care initially and then went to orthopedic PA on 10/16 and then again on 10/21/24--he was found to have fibular fracture. The patient began weening himself from the boot after his visit 10/21/24 and is no longer wearing it. He arrives today with pain with walking. He attempted to stretch it at home and notes pain worsened.   MD NOTE ON 10/21/24:  Patient will continue with the boot for another 2 weeks and start to transition out of it. Discussed resumption of activities as tolerated. He will can transition from the boot to a brace.  Follow-up as needed. He does want to follow-up with our foot doctor to discuss the possibility of an ankle replacement.   Pt accompanied by: significant other  PERTINENT HISTORY: a-fib, cancer, GERD, HTN, CAD  PAIN:  Are you having pain? Yes: NPRS scale: 0/10, 6/10 with walking Pain location: posterior leg/ankle Pain description: with weight bearing Aggravating factors: walking Relieving factors: rest  PRECAUTIONS: Fall  WEIGHT BEARING RESTRICTIONS: Yes WBAT  FALLS: Has patient fallen in last 6 months? No  LIVING ENVIRONMENT: Lives with: significant other/roommate Lives in: House/apartment Stairs: No  PLOF: Independent  PATIENT GOALS: reduce pain, return to golf, improve walking  OBJECTIVE:  Note: Objective measures were completed at Evaluation unless otherwise noted.  DIAGNOSTIC FINDINGS: Review of imaging reveals x-rays 3 views of the left ankle from outside facility reveal old fibula distal shaft fracture that has callus formation minimal displacement otherwise patient has osteopenia and ankle arthritic changes with loss of joint space noted X-rays 2 views of the left tib-fib once again reveal old distal fibular shaft fracture otherwise no other fractures noted patient does have what appears to be staples from previous surgical procedure and the medial aspect of the lower leg.  COGNITION: Overall cognitive status: WFLs   SENSATION: WFL  EDEMA:  Figure 8: R is 59.5cm, L is 62 cm   LOWER EXTREMITY ROM:    Active  Right Eval Left Eval  Hip flexion    Hip extension    Hip abduction    Hip adduction    Hip internal rotation    Hip external rotation    Knee flexion    Knee extension    Ankle dorsiflexion -5 -5  Ankle plantarflexion 45 32  Ankle inversion 20 20  Ankle eversion 20 10   (Blank rows = not tested)  LOWER EXTREMITY MMT:   MMT Right Eval Left Eval  Hip flexion 5/5 5/5  Hip extension    Hip abduction    Hip adduction    Hip internal rotation     Hip external rotation    Knee flexion 4/5 5/5  Knee extension 5/5 5/5  Ankle dorsiflexion 5/5 5/5  Ankle plantarflexion    Ankle inversion    Ankle eversion    (Blank rows = not tested)  BED MOBILITY:  Findings: WFLs  GAIT: Findings: Gait Characteristics: antalgic pattern, shortened step length R LE, Distance walked: 100 ft, and Comments: 20 ft/9.15 seconds=2.19 ft/sec  PATIENT SURVEYS:  PSFS: THE PATIENT SPECIFIC FUNCTIONAL SCALE  Place score of 0-10 (0 = unable to perform activity and 10 = able to perform activity at the same level as before injury or problem)  Activity Date: 11/01/24    Walking  1    2.Standing 1    Total Score 2/2=1      Total Score =  Sum of activity scores/number of activities  Minimally Detectable Change: 3 points (for single activity); 2 points (for average score)  [facility] (nd). The Patient Specific Functional Scale . Retrieved from [email or web address]                                                                                                                               [facility] Adult PT Treatment:                                                DATE: 11/01/24 Therapeutic Exercise: See HEP Also did seated heel cord stretch-- but he tolerated standing stretch well too  PATIENT EDUCATION: Education details: HEP, plan of care Person educated: Patient and Caregiver   Education method: Explanation, Demonstration, and Handouts Education comprehension: verbalized understanding, returned demonstration, and needs further education  HOME EXERCISE PROGRAM: Access Code: [email or web address] URL: [email or web address] Date: 11/01/2024 Prepared by: Tawni Ferrier  Exercises - Towel Scrunches  - 2 x daily - 7 x weekly - 1 sets - 10 reps - Seated Ankle Pumps  - 2 x daily - 7 x weekly - 1 sets - 10 reps - Gastroc Stretch on Wall  - 2 x daily - 5 x weekly - 1 sets - 2 reps - 30  seconds hold  GOALS: Goals reviewed with patient? Yes  SHORT TERM GOALS: Target date: 11/30/24  The patient will be indep with initial HEP Baseline: initiated at eval Goal status: INITIAL  2.  The patient will report pain with walking < or equal to 2/10.  Baseline:  6/10 Goal status: INITIAL  LONG TERM GOALS: Target date: 12/14/24  The patient will be indep with progression of HEP. Baseline:  initiated at eval Goal status: INITIAL  2.  The patient will return to gym routine for LE bike and machines. Baseline: Unable Goal status: INITIAL  3.  The patient will improve gait speed to > or equal to 2.6 ft/sec to demo improved mobility. Baseline:  2.19 ft/sec Goal status: INITIAL  4.  The patient will report no pain with walking household and limited community distances. Baseline:  6/10 pain. Goal status: INITIAL  5. The patient will improve PSFS from score of 1, to a score of 3.  Baseline: 1  Goal status: INITIAL   ASSESSMENT:  CLINICAL IMPRESSION: Patient is a 89 y.o. male who was seen today for physical therapy evaluation and treatment for L closed avulsion fracture distal fibula. He was put in a boot to allow for healing and now notes having heel cord pain with weight bearing activities. He presents with impairments in ROM, gait speed, gait mechanics, strength, and general functional status. Pain is limiting loading of the L LE during gait leading to shortened steps and therefore, increased fall risk. Patient tolerated initial exercises  today and was able to demonstrate improved stride length at end of session (gait speed measured after HEP initiated).    OBJECTIVE IMPAIRMENTS: Abnormal gait, decreased activity tolerance, decreased balance, decreased ROM, decreased strength, hypomobility, increased fascial restrictions, impaired sensation, and pain.   ACTIVITY LIMITATIONS: bending, squatting, stairs, locomotion level, and wellness routine and golf  PARTICIPATION  LIMITATIONS: community activity  PERSONAL FACTORS: 3+ comorbidities: a-fib, HTN, CAD are also affecting patient's functional outcome.   REHAB POTENTIAL: Good  CLINICAL DECISION MAKING: Evolving/moderate complexity  EVALUATION COMPLEXITY: Moderate  PLAN:  PT FREQUENCY: 2x/week  PT DURATION: 6 weeks  PLANNED INTERVENTIONS: 02835- PT Re-evaluation, 97750- Physical Performance Testing, 97110-Therapeutic exercises, 97530- Therapeutic activity, 97112- Neuromuscular re-education, 97535- Self Care, 02859- Manual therapy, [phone]- Gait training, [phone]- Orthotic Initial, [phone]- Aquatic Therapy, [phone] (1-2 muscles), [phone] (3+ muscles)- Dry Needling, Patient/Family education, Balance training, Taping, Joint mobilization, and Cryotherapy  PLAN FOR NEXT SESSION: trial recumbent bike and nu-step for patient to return to at the gym (he is doing upper body), initiate isometrics ankle, work on stride positioning in standing to accept load through ankle, gait mechanics, LE strengthening, return to gym routine as able.    Manessa Buley, PT 11/01/2024, 2:10 PM

## 2024-11-02 ENCOUNTER — Other Ambulatory Visit: Payer: Self-pay | Admitting: Pharmacist

## 2024-11-02 DIAGNOSIS — I4821 Permanent atrial fibrillation: Secondary | ICD-10-CM

## 2024-11-02 NOTE — Progress Notes (Signed)
 11/02/2024 Name: [name] MRN: [id number] DOB: [birth year]  No chief complaint on file.   [name] is a 89 y.o. year old male.     They were referred to the pharmacist by their PCP for assistance in managing medication access.    Subjective:  Medication Access/Adherence  Current Pharmacy:  [id number] - [place], [place] - [address] AT [facility] OF [address] [place] [address] Phone: [phone] Fax: [phone]   Patient reports affordability concerns with their medications: Yes  - He usually reaches Medicare Coverage gap and cost of Eliquis  and Rybelsus  is high.  We were able to get Rybelsus  from medication assistance program but this will end 12/08/2024.  He has purchased Eliquis  from [place] in 2025.  He has chosen a Astr[employer] for 2026 - [employer] [place] Patient reports access/transportation concerns to their pharmacy: No  Patient reports adherence concerns with their medications:  No     Diabetes:  Current medications: Rybelsus  7mg  daily  Previously tried Farxiga  but stopped due to balanitis and rash. No metformin due to CKD (last eGFR was 40)  Patient denies hypoglycemic s/sx including no dizziness, shakiness, sweating. Patient denies hyperglycemic symptoms including no polyuria, polydipsia, polyphagia, nocturia, neuropathy, blurred vision.  Current medication access support: Rybelsus  / Novo Nordisk thru 12/08/2024  Macrovascular and Microvascular Risk Reduction:  Statin? yes (atorvastatin ); ACEi/ARB? yes (benezepril) Last urinary albumin/creatinine ratio:  needed - next appt with PCP in December 2025 Last eye exam:  Lab Results  Component Value Date   HMDIABEYEEXA No Retinopathy 08/24/2024   Last foot exam: 08/12/2023 Tobacco Use:  Tobacco Use: Medium Risk (11/01/2024)   Patient History    Smoking Tobacco Use: Former    Smokeless Tobacco Use: Never    Passive Exposure: Not on file     Atrial  Fibrillation:  Current medications: Rate Control: metoprolol  Rhythm Control:  Anticoagulation Regimen: Eliquis  2.5mg  twice a day   Age = 89 yo; weight = 96 kg; Scr = 1.76 Eliquis  dose adjusted based on age > 80 and Scr > 1.5    Objective:  Lab Results  Component Value Date   HGBA1C 7.1 (H) 07/26/2024    Lab Results  Component Value Date   CREATININE 1.76 (H) 04/21/2024   BUN 25 (H) 04/21/2024   NA 140 04/21/2024   K 4.5 04/21/2024   CL 105 04/21/2024   CO2 27 04/21/2024    Lab Results  Component Value Date   CHOL 141 12/09/2023   HDL 36.60 (L) 12/09/2023   LDLCALC 48 12/09/2023   LDLDIRECT 47.0 12/11/2022   TRIG 279.0 (H) 12/09/2023   CHOLHDL 4 12/09/2023    Medications Reviewed Today     Reviewed by Carla Milling, RPH-CPP (Pharmacist) on 11/02/24 at 1512  Med List Status: <None>   Medication Order Taking? Sig Documenting Provider Last Dose Status Informant  acetaminophen  (TYLENOL ) 650 MG CR tablet [id number] Yes Take 1 tablet (650 mg total) by mouth in the morning and at bedtime. Curtis Debby PARAS, MD  Active   apixaban  (ELIQUIS ) 2.5 MG TABS tablet [id number] Yes TAKE 1 TABLET(2.5 MG) BY MOUTH TWICE DAILY Pietro Redell RAMAN, MD  Active   atorvastatin  (LIPITOR) 40 MG tablet [id number] Yes TAKE 1 TABLET(40 MG) BY MOUTH DAILY Pietro Redell RAMAN, MD  Active   benazepril  (LOTENSIN ) 20 MG tablet [id number] Yes Take 1 tablet (20 mg total) by mouth daily. Amon Aloysius BRAVO, MD  Active   Coenzyme Q10 (COQ10) 100 MG CAPS [id number] Yes Take 1 capsule by mouth daily.  [provider]  Active Self  furosemide  (LASIX ) 40 MG tablet [id number] Yes Take 1 tablet (40 mg total) by mouth daily. Amon Aloysius BRAVO, MD  Active   Melatonin 10 MG TABS [id number] Yes Take 10 mg by mouth at bedtime.  [provider]  Active Self  metoprolol  succinate (TOPROL -XL) 25 MG 24 hr tablet [id number] Yes Take 1 tablet (25 mg total) by mouth daily. Take with or immediately following a meal  Patient  taking differently: Take 12.5 mg by mouth daily. Take with or immediately following a meal   Paz, Jose E, MD  Active   Multiple Vitamin (MULTIVITAMIN WITH MINERALS) TABS tablet [id number] Yes Take 1 tablet by mouth daily. [provider]  Active Self  Semaglutide  (RYBELSUS ) 7 MG TABS [id number] Yes Take 1 tablet (7 mg total) by mouth daily. Paz, Jose E, MD  Active   solifenacin  (VESICARE ) 5 MG tablet [id number]  Take 1 tablet (5 mg total) by mouth daily.  Patient not taking: Reported on 11/02/2024   Paz, Jose E, MD  Active   tamsulosin  (FLOMAX ) 0.4 MG CAPS capsule [id number] Yes Take 1 capsule (0.4 mg total) by mouth daily after supper. Amon Aloysius BRAVO, MD  Active               Assessment/Plan:   Diabetes: A1c at goal of < 7.0%: - Recommend to continue Rybelsus  7mg  daily.  - Discussed HIS 2026 [employer] plan - will have deductible of $250 and both Eliquis  and Rybelsus  will be $47 / month after deductible is met. If he gets the [facility] pharamcy cost will be $131 for 90 days (verus $141 at retail for 90 days)    Afib: controlled - continue current dose of Eliquis  2.5mg  twice a day and metoprolol  12.5mg  daily   Follow Up Plan: January to assist with transition over to [facility] if needed.   Madelin Ray, PharmD Clinical Pharmacist [facility]

## 2024-11-03 ENCOUNTER — Ambulatory Visit

## 2024-11-03 DIAGNOSIS — M25572 Pain in left ankle and joints of left foot: Secondary | ICD-10-CM

## 2024-11-03 DIAGNOSIS — M6281 Muscle weakness (generalized): Secondary | ICD-10-CM

## 2024-11-03 DIAGNOSIS — S82832A Other fracture of upper and lower end of left fibula, initial encounter for closed fracture: Secondary | ICD-10-CM | POA: Diagnosis not present

## 2024-11-03 DIAGNOSIS — R2689 Other abnormalities of gait and mobility: Secondary | ICD-10-CM

## 2024-11-03 NOTE — Therapy (Signed)
 OUTPATIENT PHYSICAL THERAPY TREATMENT   Patient Name: [name] MRN: [id number] DOB:01/07/1931, 89 y.o., male Today's Date: 11/03/2024  PCP: Aloysius Mech, MD REFERRING PROVIDER: Adine Birmingham, PA  END OF SESSION:  PT End of Session - 11/03/24 1018     Visit Number 2    Number of Visits 16    Date for Recertification  01/20/25    Authorization Type [employer] medicare--auth to submit    PT Start Time 1018    PT Stop Time 1102    PT Time Calculation (min) 44 min    Activity Tolerance Patient tolerated treatment well    Behavior During Therapy [facility] for tasks assessed/performed          Past Medical History:  Diagnosis Date   Anemia    Antral gastritis 2013   EGD    Atrial fibrillation (HCC)    CAD (coronary artery disease)    s/p CABG 1993 (L-LAD, S-RI, S-D1);   echo 1/10: EF 55%;    myoview  12/09: inf MI, no ischemia   Candida esophagitis (HCC) 2013   EGD    Cataracts, bilateral    Family history of malignant neoplasm of gastrointestinal tract    Folliculitis    Hiatal hernia    HTN (hypertension)    Hyperlipidemia    Irritable bladder    Myocardial infarction [facility])    Obesity    Osteoarthritis    Past Surgical History:  Procedure Laterality Date   BLEPHAROPLASTY Bilateral 11/16/2021   upper lid   CATARACT EXTRACTION Right 07/21/2018   CHOLECYSTECTOMY     laparoscopic '92   CORONARY ARTERY BYPASS GRAFT     graft '92: LIMA-LAD, SVG - D2,R1, D1   ENTEROSCOPY  08/22/2012   Procedure: ENTEROSCOPY;  Surgeon: Renaye Sous, MD;  Location: [facility];  Service: Endoscopy;  Laterality: N/A;   INTRAOCULAR LENS EXCHANGE Right 07/21/2018   KNEE SURGERY     MOLE REMOVAL  2001 and 2008   PAROTIDECTOMY Right 06/18/2021   PILONIDAL CYST EXCISION     Patient Active Problem List   Diagnosis Date Noted   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) 07/26/2024   CKD (chronic kidney disease) stage 3, GFR 30-59 ml/min (HCC) 07/01/2022    Lymphadenopathy, inguinal, right 12/17/2021   Primary osteoarthritis of right knee 07/10/2021   Lumbar spondylosis 04/16/2019   CHF (congestive heart failure) (HCC) 12/17/2016   PCP NOTES >>>>> 10/11/2015   Carpal tunnel syndrome 02/15/2014   Anemia 08/14/2012   Annual physical exam 01/08/2012   Atrial fibrillation (HCC) 03/29/2011   Venous (peripheral) insufficiency 09/04/2009   OBESITY 07/04/2009   DJD (degenerative joint disease) 07/04/2009   SLEEP DISORDER 07/04/2009   DM II (diabetes mellitus, type II), controlled (HCC) 04/25/2008   Hyperlipidemia 06/25/2007   Essential hypertension 06/25/2007   CAD (coronary artery disease) of artery bypass graft 06/25/2007    ONSET DATE: 11/01/24  REFERRING DIAG:  T80.CHERENE (ICD-10-CM) - Fall, initial encounter  S82.832A (ICD-10-CM) - Closed avulsion fracture of distal end of left fibula, initial encounter    THERAPY DIAG:  Pain in left ankle and joints of left foot  Other abnormalities of gait and mobility  Muscle weakness (generalized)  Rationale for Evaluation and Treatment: Rehabilitation  SUBJECTIVE:  SUBJECTIVE STATEMENT:  Patient reports 5/10 pain in heel cord, states is is very stiff. Patient states he is able to move ankle all around and it doesn't hurt, states it is weightbearing that brings the pain.   EVAL: The patient had onset of L ankle pain in early September after golfing. He was seen by urgent care initially and then went to orthopedic PA on 10/16 and then again on 10/21/24--he was found to have fibular fracture. The patient began weening himself from the boot after his visit 10/21/24 and is no longer wearing it. He arrives today with pain with walking. He attempted to stretch it at home and notes pain worsened.   MD NOTE ON  10/21/24:  Patient will continue with the boot for another 2 weeks and start to transition out of it. Discussed resumption of activities as tolerated. He will can transition from the boot to a brace. Follow-up as needed. He does want to follow-up with our foot doctor to discuss the possibility of an ankle replacement.   Pt accompanied by: significant other  PERTINENT HISTORY: a-fib, cancer, GERD, HTN, CAD  PAIN:  Are you having pain? Yes: NPRS scale: 0/10, 6/10 with walking Pain location: posterior leg/ankle Pain description: with weight bearing Aggravating factors: walking Relieving factors: rest  PRECAUTIONS: Fall  WEIGHT BEARING RESTRICTIONS: Yes WBAT  FALLS: Has patient fallen in last 6 months? No  LIVING ENVIRONMENT: Lives with: significant other/roommate Lives in: House/apartment Stairs: No  PLOF: Independent  PATIENT GOALS: reduce pain, return to golf, improve walking  OBJECTIVE:  Note: Objective measures were completed at Evaluation unless otherwise noted.  DIAGNOSTIC FINDINGS: Review of imaging reveals x-rays 3 views of the left ankle from outside facility reveal old fibula distal shaft fracture that has callus formation minimal displacement otherwise patient has osteopenia and ankle arthritic changes with loss of joint space noted X-rays 2 views of the left tib-fib once again reveal old distal fibular shaft fracture otherwise no other fractures noted patient does have what appears to be staples from previous surgical procedure and the medial aspect of the lower leg.  COGNITION: Overall cognitive status: WFLs   SENSATION: WFL  EDEMA:  Figure 8: R is 59.5cm, L is 62 cm   LOWER EXTREMITY ROM:    Active  Right Eval Left Eval  Hip flexion    Hip extension    Hip abduction    Hip adduction    Hip internal rotation    Hip external rotation    Knee flexion    Knee extension    Ankle dorsiflexion -5 -5  Ankle plantarflexion 45 32  Ankle inversion 20 20   Ankle eversion 20 10   (Blank rows = not tested)  LOWER EXTREMITY MMT:   MMT Right Eval Left Eval  Hip flexion 5/5 5/5  Hip extension    Hip abduction    Hip adduction    Hip internal rotation    Hip external rotation    Knee flexion 4/5 5/5  Knee extension 5/5 5/5  Ankle dorsiflexion 5/5 5/5  Ankle plantarflexion    Ankle inversion    Ankle eversion    (Blank rows = not tested)  BED MOBILITY:  Findings: WFLs  GAIT: Findings: Gait Characteristics: antalgic pattern, shortened step length R LE, Distance walked: 100 ft, and Comments: 20 ft/9.15 seconds=2.19 ft/sec  PATIENT SURVEYS:  PSFS: THE PATIENT SPECIFIC FUNCTIONAL SCALE  Place score of 0-10 (0 = unable to perform activity and 10 = able to perform  activity at the same level as before injury or problem)  Activity Date: 11/01/24    Walking  1    2.Standing 1    Total Score 2/2=1      Total Score = Sum of activity scores/number of activities  Minimally Detectable Change: 3 points (for single activity); 2 points (for average score)  [facility] (nd). The Patient Specific Functional Scale . Retrieved from [email or web address]                                                                                                                               [facility] Adult PT Treatment:                                                DATE: 11/03/2024 Neuromuscular re-ed: Ankle isometrics --> all directions Ankle EV/INV  BAPS board: DF/PF, EVE/INV (AAROM) Therapeutic Activity: NuStep L5 x 5 min to promote ankle dorsiflexion mobility Seated soleus & gastroc stretches with strap 10x10 sec each  Staggered stance weight shifting on/off Lt LE Trialed recumbent bike -->deferred d/t Rt knee    [facility] Adult PT Treatment:                                                DATE: 11/01/24 Therapeutic Exercise: See HEP Also did seated heel cord stretch-- but he tolerated  standing stretch well too  PATIENT EDUCATION: Education details: Updated HEP Person educated: Patient and Caregiver   Education method: Explanation, Demonstration, and Handouts Education comprehension: verbalized understanding, returned demonstration, and needs further education  HOME EXERCISE PROGRAM: Access Code: [email or web address] URL: [email or web address] Date: 11/03/2024 Prepared by: Lamarr Price  Exercises - Towel Scrunches  - 2 x daily - 7 x weekly - 1 sets - 10 reps - Seated Ankle Pumps  - 2 x daily - 7 x weekly - 1 sets - 10 reps - Gastroc Stretch on Wall  - 2 x daily - 5 x weekly - 1 sets - 2 reps - 30 seconds hold - Isometric Ankle Eversion at Wall  - 1 x daily - 7 x weekly - 1 sets - 10 reps - 3-5 sec hold - Isometric Ankle Inversion at Wall  - 1 x daily - 7 x weekly - 1 sets - 10 reps - 3-5 sec hold - Long Sitting Isometric Ankle Plantarflexion with Ball at Wall  - 1 x daily - 7 x weekly - 1 sets - 10 reps - 3-5 sec hold  GOALS: Goals reviewed with patient? Yes  SHORT TERM GOALS: Target date: 11/30/24  The patient will be indep with initial HEP Baseline: initiated at eval Goal status: INITIAL  2.  The  patient will report pain with walking < or equal to 2/10.  Baseline:  6/10 Goal status: INITIAL  LONG TERM GOALS: Target date: 12/14/24  The patient will be indep with progression of HEP. Baseline:  initiated at eval Goal status: INITIAL  2.  The patient will return to gym routine for LE bike and machines. Baseline: Unable Goal status: INITIAL  3.  The patient will improve gait speed to > or equal to 2.6 ft/sec to demo improved mobility. Baseline:  2.19 ft/sec Goal status: INITIAL  4.  The patient will report no pain with walking household and limited community distances. Baseline:  6/10 pain. Goal status: INITIAL  5. The patient will improve PSFS from score of 1, to a score of 3.  Baseline: 1  Goal status: INITIAL   ASSESSMENT:  CLINICAL  IMPRESSION: Introduced ankle isometrics; patient tolerated well with no exacerbation of pain. Ankle mobility progressed with BAPS board; tactile cues needed to decrease LE compensation for ankle mobility. NuStep added to promote active ankle dorsiflexion, however patient did not tolerate recumbent bike due to Rt knee pain.  EVAL: Patient is a 89 y.o. male who was seen today for physical therapy evaluation and treatment for L closed avulsion fracture distal fibula. He was put in a boot to allow for healing and now notes having heel cord pain with weight bearing activities. He presents with impairments in ROM, gait speed, gait mechanics, strength, and general functional status. Pain is limiting loading of the L LE during gait leading to shortened steps and therefore, increased fall risk. Patient tolerated initial exercises today and was able to demonstrate improved stride length at end of session (gait speed measured after HEP initiated).    OBJECTIVE IMPAIRMENTS: Abnormal gait, decreased activity tolerance, decreased balance, decreased ROM, decreased strength, hypomobility, increased fascial restrictions, impaired sensation, and pain.   ACTIVITY LIMITATIONS: bending, squatting, stairs, locomotion level, and wellness routine and golf  PARTICIPATION LIMITATIONS: community activity  PERSONAL FACTORS: 3+ comorbidities: a-fib, HTN, CAD are also affecting patient's functional outcome.   REHAB POTENTIAL: Good  CLINICAL DECISION MAKING: Evolving/moderate complexity  EVALUATION COMPLEXITY: Moderate  PLAN:  PT FREQUENCY: 2x/week  PT DURATION: 6 weeks  PLANNED INTERVENTIONS: 97164- PT Re-evaluation, 97750- Physical Performance Testing, 97110-Therapeutic exercises, 97530- Therapeutic activity, [id number]- Neuromuscular re-education, 97535- Self Care, [id number]- Manual therapy, [id number]- Gait training, [phone]- Orthotic Initial, [phone]- Aquatic Therapy, [phone] (1-2 muscles), [phone] (3+ muscles)- Dry Needling,  Patient/Family education, Balance training, Taping, Joint mobilization, and Cryotherapy  PLAN FOR NEXT SESSION: Continue isometrics ankle, progress to light resistance as tolerated. Work on stride positioning in standing to accept load through ankle, gait mechanics, LE strengthening, return to gym routine as able. NuStep for ankle DF mobility (did not tolerate recumbent bike d/t Rt knee)   Lamarr GORMAN Price, PTA 11/03/2024, 11:02 AM

## 2024-11-08 ENCOUNTER — Ambulatory Visit

## 2024-11-08 DIAGNOSIS — G8929 Other chronic pain: Secondary | ICD-10-CM | POA: Insufficient documentation

## 2024-11-08 DIAGNOSIS — M25572 Pain in left ankle and joints of left foot: Secondary | ICD-10-CM | POA: Insufficient documentation

## 2024-11-08 DIAGNOSIS — R2689 Other abnormalities of gait and mobility: Secondary | ICD-10-CM | POA: Insufficient documentation

## 2024-11-08 DIAGNOSIS — R29898 Other symptoms and signs involving the musculoskeletal system: Secondary | ICD-10-CM | POA: Insufficient documentation

## 2024-11-08 DIAGNOSIS — M6281 Muscle weakness (generalized): Secondary | ICD-10-CM | POA: Diagnosis present

## 2024-11-08 DIAGNOSIS — M25562 Pain in left knee: Secondary | ICD-10-CM | POA: Insufficient documentation

## 2024-11-08 NOTE — Therapy (Signed)
 OUTPATIENT PHYSICAL THERAPY TREATMENT   Patient Name: [name] MRN: [id number] DOB:[birth year], 89 y.o., male Today's Date: 11/08/2024  PCP: Aloysius Mech, MD REFERRING PROVIDER: Adine Birmingham, PA  END OF SESSION:  PT End of Session - 11/08/24 1019     Visit Number 3    Number of Visits 16    Date for Recertification  01/20/25    Authorization Type [employer] medicare--auth to submit    PT Start Time 1019    PT Stop Time 1104    PT Time Calculation (min) 45 min    Activity Tolerance Patient tolerated treatment well    Behavior During Therapy [facility] for tasks assessed/performed          Past Medical History:  Diagnosis Date   Anemia    Antral gastritis 2013   EGD    Atrial fibrillation (HCC)    CAD (coronary artery disease)    s/p CABG 1993 (L-LAD, S-RI, S-D1);   echo 1/10: EF 55%;    myoview  12/09: inf MI, no ischemia   Candida esophagitis (HCC) 2013   EGD    Cataracts, bilateral    Family history of malignant neoplasm of gastrointestinal tract    Folliculitis    Hiatal hernia    HTN (hypertension)    Hyperlipidemia    Irritable bladder    Myocardial infarction [facility])    Obesity    Osteoarthritis    Past Surgical History:  Procedure Laterality Date   BLEPHAROPLASTY Bilateral 11/16/2021   upper lid   CATARACT EXTRACTION Right 07/21/2018   CHOLECYSTECTOMY     laparoscopic '92   CORONARY ARTERY BYPASS GRAFT     graft '92: LIMA-LAD, SVG - D2,R1, D1   ENTEROSCOPY  08/22/2012   Procedure: ENTEROSCOPY;  Surgeon: Renaye Sous, MD;  Location: [facility];  Service: Endoscopy;  Laterality: N/A;   INTRAOCULAR LENS EXCHANGE Right 07/21/2018   KNEE SURGERY     MOLE REMOVAL  2001 and 2008   PAROTIDECTOMY Right 06/18/2021   PILONIDAL CYST EXCISION     Patient Active Problem List   Diagnosis Date Noted   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) 07/26/2024   CKD (chronic kidney disease) stage 3, GFR 30-59 ml/min (HCC) 07/01/2022    Lymphadenopathy, inguinal, right 12/17/2021   Primary osteoarthritis of right knee 07/10/2021   Lumbar spondylosis 04/16/2019   CHF (congestive heart failure) (HCC) 12/17/2016   PCP NOTES >>>>> 10/11/2015   Carpal tunnel syndrome 02/15/2014   Anemia 08/14/2012   Annual physical exam 01/08/2012   Atrial fibrillation (HCC) 03/29/2011   Venous (peripheral) insufficiency 09/04/2009   OBESITY 07/04/2009   DJD (degenerative joint disease) 07/04/2009   SLEEP DISORDER 07/04/2009   DM II (diabetes mellitus, type II), controlled (HCC) 04/25/2008   Hyperlipidemia 06/25/2007   Essential hypertension 06/25/2007   CAD (coronary artery disease) of artery bypass graft 06/25/2007    ONSET DATE: 11/01/24  REFERRING DIAG:  T80.CHERENE (ICD-10-CM) - Fall, initial encounter  S82.832A (ICD-10-CM) - Closed avulsion fracture of distal end of left fibula, initial encounter    THERAPY DIAG:  Pain in left ankle and joints of left foot  Other abnormalities of gait and mobility  Muscle weakness (generalized)  Rationale for Evaluation and Treatment: Rehabilitation  SUBJECTIVE:  SUBJECTIVE STATEMENT:  Patient reports he is having less pain on when walking; states he is feeling stiff with the cold weather.   EVAL: The patient had onset of L ankle pain in early September after golfing. He was seen by urgent care initially and then went to orthopedic PA on 10/16 and then again on 10/21/24--he was found to have fibular fracture. The patient began weening himself from the boot after his visit 10/21/24 and is no longer wearing it. He arrives today with pain with walking. He attempted to stretch it at home and notes pain worsened.   MD NOTE ON 10/21/24:  Patient will continue with the boot for another 2 weeks and start to  transition out of it. Discussed resumption of activities as tolerated. He will can transition from the boot to a brace. Follow-up as needed. He does want to follow-up with our foot doctor to discuss the possibility of an ankle replacement.   Pt accompanied by: significant other  PERTINENT HISTORY: a-fib, cancer, GERD, HTN, CAD  PAIN:  Are you having pain? Yes: NPRS scale: 0/10, 5/10 with walking Pain location: posterior leg/ankle Pain description: with weight bearing Aggravating factors: walking Relieving factors: rest  PRECAUTIONS: Fall  WEIGHT BEARING RESTRICTIONS: Yes WBAT  FALLS: Has patient fallen in last 6 months? No  LIVING ENVIRONMENT: Lives with: significant other/roommate Lives in: House/apartment Stairs: No  PLOF: Independent  PATIENT GOALS: reduce pain, return to golf, improve walking  OBJECTIVE:  Note: Objective measures were completed at Evaluation unless otherwise noted.  DIAGNOSTIC FINDINGS: Review of imaging reveals x-rays 3 views of the left ankle from outside facility reveal old fibula distal shaft fracture that has callus formation minimal displacement otherwise patient has osteopenia and ankle arthritic changes with loss of joint space noted X-rays 2 views of the left tib-fib once again reveal old distal fibular shaft fracture otherwise no other fractures noted patient does have what appears to be staples from previous surgical procedure and the medial aspect of the lower leg.  COGNITION: Overall cognitive status: WFLs   SENSATION: WFL  EDEMA:  Figure 8: R is 59.5cm, L is 62 cm   LOWER EXTREMITY ROM:    Active  Right Eval Left Eval  Hip flexion    Hip extension    Hip abduction    Hip adduction    Hip internal rotation    Hip external rotation    Knee flexion    Knee extension    Ankle dorsiflexion -5 -5  Ankle plantarflexion 45 32  Ankle inversion 20 20  Ankle eversion 20 10   (Blank rows = not tested)  LOWER EXTREMITY MMT:   MMT  Right Eval Left Eval  Hip flexion 5/5 5/5  Hip extension    Hip abduction    Hip adduction    Hip internal rotation    Hip external rotation    Knee flexion 4/5 5/5  Knee extension 5/5 5/5  Ankle dorsiflexion 5/5 5/5  Ankle plantarflexion    Ankle inversion    Ankle eversion    (Blank rows = not tested)  BED MOBILITY:  Findings: WFLs  GAIT: Findings: Gait Characteristics: antalgic pattern, shortened step length R LE, Distance walked: 100 ft, and Comments: 20 ft/9.15 seconds=2.19 ft/sec  PATIENT SURVEYS:  PSFS: THE PATIENT SPECIFIC FUNCTIONAL SCALE  Place score of 0-10 (0 = unable to perform activity and 10 = able to perform activity at the same level as before injury or problem)  Activity Date: 11/01/24  Walking  1    2.Standing 1    Total Score 2/2=1      Total Score = Sum of activity scores/number of activities  Minimally Detectable Change: 3 points (for single activity); 2 points (for average score)  [facility] (nd). The Patient Specific Functional Scale . Retrieved from [email or web address]    [facility] Adult PT Treatment:                                                DATE: 11/08/2024 Neuromuscular re-ed: Ankle isometrics --> EV, INV, PF Seated heel raise + 15#KB resting on knee x 20 Therapeutic Activity: NuStep L5 x 5 min to promote ankle dorsiflexion mobility Seated gastroc & soleus stretches with strap --> foot propped on small box Weight shifting in stride stance --> fwd shifting onto Lt LE Standing hip abd & extension x10 each (bil) Seated ankle DF/PF & EV/INV with rocker board Gait: Gait training with focus on heel strike                                                                                                                                [facility] Adult PT Treatment:                                                DATE: 11/03/2024 Neuromuscular re-ed: Ankle isometrics --> all  directions Ankle EV/INV  BAPS board: DF/PF, EVE/INV (AAROM) Therapeutic Activity: NuStep L5 x 5 min to promote ankle dorsiflexion mobility Seated soleus & gastroc stretches with strap 10x10 sec each  Staggered stance weight shifting on/off Lt LE Trialed recumbent bike -->deferred d/t Rt knee   [facility] Adult PT Treatment:                                                DATE: 11/01/24 Therapeutic Exercise: See HEP Also did seated heel cord stretch-- but he tolerated standing stretch well too   PATIENT EDUCATION: Education details: Updated HEP Person educated: Patient Education method: Explanation, Demonstration, and Handouts Education comprehension: verbalized understanding, returned demonstration, and needs further education  HOME EXERCISE PROGRAM: Access Code: [email or web address] URL: [email or web address] Date: 11/03/2024 Prepared by: Lamarr Price  Exercises - Towel Scrunches  - 2 x daily - 7 x weekly - 1 sets - 10 reps - Seated Ankle Pumps  - 2 x daily - 7 x weekly - 1 sets - 10 reps - Gastroc Stretch on Wall  - 2 x daily - 5 x weekly - 1 sets - 2 reps - 30 seconds  hold - Isometric Ankle Eversion at Wall  - 1 x daily - 7 x weekly - 1 sets - 10 reps - 3-5 sec hold - Isometric Ankle Inversion at Wall  - 1 x daily - 7 x weekly - 1 sets - 10 reps - 3-5 sec hold - Long Sitting Isometric Ankle Plantarflexion with Ball at Wall  - 1 x daily - 7 x weekly - 1 sets - 10 reps - 3-5 sec hold  GOALS: Goals reviewed with patient? Yes  SHORT TERM GOALS: Target date: 11/30/24  The patient will be indep with initial HEP Baseline: initiated at eval Goal status: INITIAL  2.  The patient will report pain with walking < or equal to 2/10.  Baseline:  6/10 Goal status: INITIAL  LONG TERM GOALS: Target date: 12/14/24  The patient will be indep with progression of HEP. Baseline:  initiated at eval Goal status: INITIAL  2.  The patient will return to gym routine for LE bike and  machines. Baseline: Unable Goal status: INITIAL  3.  The patient will improve gait speed to > or equal to 2.6 ft/sec to demo improved mobility. Baseline:  2.19 ft/sec Goal status: INITIAL  4.  The patient will report no pain with walking household and limited community distances. Baseline:  6/10 pain. Goal status: INITIAL  5. The patient will improve PSFS from score of 1, to a score of 3.  Baseline: 1  Goal status: INITIAL   ASSESSMENT:  CLINICAL IMPRESSION:  Weight shifting progressed to gait training with focus on heel strike. One episode of LOB with patient utilizing stepping strategy to regain balance; incorporated standing hip strengthening to address functional strengthening. Noted improvement in tolerance with weigh bearing on Lt LE in when walking; mild pain along lateral ankle/foot reported towards end of gait training.   EVAL: Patient is a 89 y.o. male who was seen today for physical therapy evaluation and treatment for L closed avulsion fracture distal fibula. He was put in a boot to allow for healing and now notes having heel cord pain with weight bearing activities. He presents with impairments in ROM, gait speed, gait mechanics, strength, and general functional status. Pain is limiting loading of the L LE during gait leading to shortened steps and therefore, increased fall risk. Patient tolerated initial exercises today and was able to demonstrate improved stride length at end of session (gait speed measured after HEP initiated).    OBJECTIVE IMPAIRMENTS: Abnormal gait, decreased activity tolerance, decreased balance, decreased ROM, decreased strength, hypomobility, increased fascial restrictions, impaired sensation, and pain.   ACTIVITY LIMITATIONS: bending, squatting, stairs, locomotion level, and wellness routine and golf  PARTICIPATION LIMITATIONS: community activity  PERSONAL FACTORS: 3+ comorbidities: a-fib, HTN, CAD are also affecting patient's functional outcome.    REHAB POTENTIAL: Good  CLINICAL DECISION MAKING: Evolving/moderate complexity  EVALUATION COMPLEXITY: Moderate  PLAN:  PT FREQUENCY: 2x/week  PT DURATION: 6 weeks  PLANNED INTERVENTIONS: 97164- PT Re-evaluation, 97750- Physical Performance Testing, 97110-Therapeutic exercises, 97530- Therapeutic activity, [id number]- Neuromuscular re-education, 97535- Self Care, 02859- Manual therapy, [id number]- Gait training, [phone]- Orthotic Initial, [phone]- Aquatic Therapy, [phone] (1-2 muscles), 20561 (3+ muscles)- Dry Needling, Patient/Family education, Balance training, Taping, Joint mobilization, and Cryotherapy  PLAN FOR NEXT SESSION: Update HEP next visit. Continue isometrics ankle, progress to light resistance as tolerated. Work on stride positioning in standing to accept load through ankle, gait mechanics, LE strengthening, return to gym routine as able. NuStep for ankle DF mobility (did not tolerate recumbent bike  d/t Rt knee)   Lamarr GORMAN Price, PTA 11/08/2024, 11:19 AM

## 2024-11-10 ENCOUNTER — Ambulatory Visit

## 2024-11-10 DIAGNOSIS — M6281 Muscle weakness (generalized): Secondary | ICD-10-CM

## 2024-11-10 DIAGNOSIS — M25572 Pain in left ankle and joints of left foot: Secondary | ICD-10-CM | POA: Diagnosis not present

## 2024-11-10 DIAGNOSIS — R2689 Other abnormalities of gait and mobility: Secondary | ICD-10-CM

## 2024-11-10 NOTE — Therapy (Signed)
 OUTPATIENT PHYSICAL THERAPY TREATMENT   Patient Name: [name] MRN: [id number] DOB:09-15-1931, 89 y.o., male Today's Date: 11/10/2024  PCP: Aloysius Mech, MD REFERRING PROVIDER: Adine Birmingham, PA  END OF SESSION:  PT End of Session - 11/10/24 1321     Visit Number 4    Number of Visits 16    Date for Recertification  01/20/25    Authorization Type [employer] medicare--auth to submit    PT Start Time 1320    PT Stop Time 1403    PT Time Calculation (min) 43 min    Activity Tolerance Patient tolerated treatment well    Behavior During Therapy [facility] for tasks assessed/performed          Past Medical History:  Diagnosis Date   Anemia    Antral gastritis 2013   EGD    Atrial fibrillation (HCC)    CAD (coronary artery disease)    s/p CABG 1993 (L-LAD, S-RI, S-D1);   echo 1/10: EF 55%;    myoview  12/09: inf MI, no ischemia   Candida esophagitis (HCC) 2013   EGD    Cataracts, bilateral    Family history of malignant neoplasm of gastrointestinal tract    Folliculitis    Hiatal hernia    HTN (hypertension)    Hyperlipidemia    Irritable bladder    Myocardial infarction [facility])    Obesity    Osteoarthritis    Past Surgical History:  Procedure Laterality Date   BLEPHAROPLASTY Bilateral 11/16/2021   upper lid   CATARACT EXTRACTION Right 07/21/2018   CHOLECYSTECTOMY     laparoscopic '92   CORONARY ARTERY BYPASS GRAFT     graft '92: LIMA-LAD, SVG - D2,R1, D1   ENTEROSCOPY  08/22/2012   Procedure: ENTEROSCOPY;  Surgeon: Renaye Sous, MD;  Location: [facility] ENDOSCOPY;  Service: Endoscopy;  Laterality: N/A;   INTRAOCULAR LENS EXCHANGE Right 07/21/2018   KNEE SURGERY     MOLE REMOVAL  2001 and 2008   PAROTIDECTOMY Right 06/18/2021   PILONIDAL CYST EXCISION     Patient Active Problem List   Diagnosis Date Noted   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) 07/26/2024   CKD (chronic kidney disease) stage 3, GFR 30-59 ml/min (HCC) 07/01/2022    Lymphadenopathy, inguinal, right 12/17/2021   Primary osteoarthritis of right knee 07/10/2021   Lumbar spondylosis 04/16/2019   CHF (congestive heart failure) (HCC) 12/17/2016   PCP NOTES >>>>> 10/11/2015   Carpal tunnel syndrome 02/15/2014   Anemia 08/14/2012   Annual physical exam 01/08/2012   Atrial fibrillation (HCC) 03/29/2011   Venous (peripheral) insufficiency 09/04/2009   OBESITY 07/04/2009   DJD (degenerative joint disease) 07/04/2009   SLEEP DISORDER 07/04/2009   DM II (diabetes mellitus, type II), controlled (HCC) 04/25/2008   Hyperlipidemia 06/25/2007   Essential hypertension 06/25/2007   CAD (coronary artery disease) of artery bypass graft 06/25/2007    ONSET DATE: 11/01/24  REFERRING DIAG:  T80.CHERENE (ICD-10-CM) - Fall, initial encounter  S82.832A (ICD-10-CM) - Closed avulsion fracture of distal end of left fibula, initial encounter    THERAPY DIAG:  Pain in left ankle and joints of left foot  Other abnormalities of gait and mobility  Muscle weakness (generalized)  Rationale for Evaluation and Treatment: Rehabilitation  SUBJECTIVE:  SUBJECTIVE STATEMENT:  Patient reports his foot continues to feel stiff, states pain fluctuates when walking.  EVAL: The patient had onset of L ankle pain in early September after golfing. He was seen by urgent care initially and then went to orthopedic PA on 10/16 and then again on 10/21/24--he was found to have fibular fracture. The patient began weening himself from the boot after his visit 10/21/24 and is no longer wearing it. He arrives today with pain with walking. He attempted to stretch it at home and notes pain worsened.   MD NOTE ON 10/21/24:  Patient will continue with the boot for another 2 weeks and start to transition out of it.  Discussed resumption of activities as tolerated. He will can transition from the boot to a brace. Follow-up as needed. He does want to follow-up with our foot doctor to discuss the possibility of an ankle replacement.   Pt accompanied by: significant other  PERTINENT HISTORY: a-fib, cancer, GERD, HTN, CAD  PAIN:  Are you having pain? Yes: NPRS scale: 0/10, 5/10 with walking Pain location: posterior leg/ankle Pain description: with weight bearing Aggravating factors: walking Relieving factors: rest  PRECAUTIONS: Fall  WEIGHT BEARING RESTRICTIONS: Yes WBAT  FALLS: Has patient fallen in last 6 months? No  LIVING ENVIRONMENT: Lives with: significant other/roommate Lives in: House/apartment Stairs: No  PLOF: Independent  PATIENT GOALS: reduce pain, return to golf, improve walking  OBJECTIVE:  Note: Objective measures were completed at Evaluation unless otherwise noted.  DIAGNOSTIC FINDINGS: Review of imaging reveals x-rays 3 views of the left ankle from outside facility reveal old fibula distal shaft fracture that has callus formation minimal displacement otherwise patient has osteopenia and ankle arthritic changes with loss of joint space noted X-rays 2 views of the left tib-fib once again reveal old distal fibular shaft fracture otherwise no other fractures noted patient does have what appears to be staples from previous surgical procedure and the medial aspect of the lower leg.  COGNITION: Overall cognitive status: WFLs   SENSATION: WFL  EDEMA:  Figure 8: R is 59.5cm, L is 62 cm   LOWER EXTREMITY ROM:    Active  Right Eval Left Eval  Hip flexion    Hip extension    Hip abduction    Hip adduction    Hip internal rotation    Hip external rotation    Knee flexion    Knee extension    Ankle dorsiflexion -5 -5  Ankle plantarflexion 45 32  Ankle inversion 20 20  Ankle eversion 20 10   (Blank rows = not tested)  LOWER EXTREMITY MMT:   MMT Right Eval Left Eval   Hip flexion 5/5 5/5  Hip extension    Hip abduction    Hip adduction    Hip internal rotation    Hip external rotation    Knee flexion 4/5 5/5  Knee extension 5/5 5/5  Ankle dorsiflexion 5/5 5/5  Ankle plantarflexion    Ankle inversion    Ankle eversion    (Blank rows = not tested)  BED MOBILITY:  Findings: WFLs  GAIT: Findings: Gait Characteristics: antalgic pattern, shortened step length R LE, Distance walked: 100 ft, and Comments: 20 ft/9.15 seconds=2.19 ft/sec  PATIENT SURVEYS:  PSFS: THE PATIENT SPECIFIC FUNCTIONAL SCALE  Place score of 0-10 (0 = unable to perform activity and 10 = able to perform activity at the same level as before injury or problem)  Activity Date: 11/01/24    Walking  1  2.Standing 1    Total Score 2/2=1      Total Score = Sum of activity scores/number of activities  Minimally Detectable Change: 3 points (for single activity); 2 points (for average score)  [facility] (nd). The Patient Specific Functional Scale . Retrieved from [email or web address]    [facility] Adult PT Treatment:                                                DATE: 11/10/2024 Neuromuscular re-ed: Standing: Hip abduction + red Tb crossed at ankles   Hip extension + red TB crossed at ankles Seated: Ankle eversion & inversion + yellow TB Ankle PF green TB --> blue TB  Paloff press +blue TB Trunk rotation + green TB Therapeutic Activity: NuStep L5 x 5 min to promote ankle dorsiflexion mobility Standing heel raise on Lt (weight bearing on Rt) Modified single leg stance (Lt) + UE support --> Rt kickstand    [facility] Adult PT Treatment:                                                DATE: 11/08/2024 Neuromuscular re-ed: Ankle isometrics --> EV, INV, PF Seated heel raise + 15#KB resting on knee x 20 Therapeutic Activity: NuStep L5 x 5 min to promote ankle dorsiflexion mobility Seated gastroc & soleus stretches with  strap --> foot propped on small box Weight shifting in stride stance --> fwd shifting onto Lt LE Standing hip abd & extension x10 each (bil) Seated ankle DF/PF & EV/INV with rocker board Gait: Gait training with focus on heel strike                                                                                                                                [facility] Adult PT Treatment:                                                DATE: 11/03/2024 Neuromuscular re-ed: Ankle isometrics --> all directions Ankle EV/INV  BAPS board: DF/PF, EVE/INV (AAROM) Therapeutic Activity: NuStep L5 x 5 min to promote ankle dorsiflexion mobility Seated soleus & gastroc stretches with strap 10x10 sec each  Staggered stance weight shifting on/off Lt LE Trialed recumbent bike -->deferred d/t Rt knee   PATIENT EDUCATION: Education details: Updated HEP Person educated: Patient Education method: Programmer, Multimedia, Facilities Manager, and Handouts Education comprehension: verbalized understanding, returned demonstration, and needs further education  HOME EXERCISE PROGRAM: Access Code: [email or web address] URL: [email or web address] Date: 11/10/2024 Prepared by: [name]  Exercises - Towel Scrunches  -  2 x daily - 7 x weekly - 1 sets - 10 reps - Gastroc Stretch on Wall  - 2 x daily - 5 x weekly - 1 sets - 2 reps - 30 seconds hold - Standing Hip Abduction with Resistance at Ankles and Counter Support  - 1 x daily - 7 x weekly - 3 sets - 10 reps - Seated Ankle Eversion with Anchored Resistance  - 1 x daily - 7 x weekly - 3 sets - 10 reps - Seated Ankle Inversion with Anchored Resistance  - 1 x daily - 7 x weekly - 3 sets - 10 reps - Seated Ankle Plantar Flexion with Resistance Loop  - 1 x daily - 7 x weekly - 3 sets - 10 reps - Seated Ankle Dorsiflexion with Anchored Resistance  - 1 x daily - 7 x weekly - 3 sets - 10 reps - Single Leg Stance with Support  - 1 x daily - 7 x weekly - 1 sets - 3-5 reps - 10-30  sec hold  GOALS: Goals reviewed with patient? Yes  SHORT TERM GOALS: Target date: 11/30/24  The patient will be indep with initial HEP Baseline: initiated at eval Goal status: INITIAL  2.  The patient will report pain with walking < or equal to 2/10.  Baseline:  6/10 Goal status: INITIAL  LONG TERM GOALS: Target date: 12/14/24  The patient will be indep with progression of HEP. Baseline:  initiated at eval Goal status: INITIAL  2.  The patient will return to gym routine for LE bike and machines. Baseline: Unable Goal status: INITIAL  3.  The patient will improve gait speed to > or equal to 2.6 ft/sec to demo improved mobility. Baseline:  2.19 ft/sec Goal status: INITIAL  4.  The patient will report no pain with walking household and limited community distances. Baseline:  6/10 pain. Goal status: INITIAL  5. The patient will improve PSFS from score of 1, to a score of 3.  Baseline: 1  Goal status: INITIAL   ASSESSMENT:  CLINICAL IMPRESSION:  Ankle strengthening progressed with light resistance with good tolerance. Incorporated core stabilizing to progress patient towards return to golf. Occasional instability noted with directional turns, however no LOB. Progressed increased weight bearing on Lt LE in standing with modified single leg stance. Patient continues to demonstrate decreased heel strike during gait.   EVAL: Patient is a 89 y.o. male who was seen today for physical therapy evaluation and treatment for L closed avulsion fracture distal fibula. He was put in a boot to allow for healing and now notes having heel cord pain with weight bearing activities. He presents with impairments in ROM, gait speed, gait mechanics, strength, and general functional status. Pain is limiting loading of the L LE during gait leading to shortened steps and therefore, increased fall risk. Patient tolerated initial exercises today and was able to demonstrate improved stride length at end of  session (gait speed measured after HEP initiated).    OBJECTIVE IMPAIRMENTS: Abnormal gait, decreased activity tolerance, decreased balance, decreased ROM, decreased strength, hypomobility, increased fascial restrictions, impaired sensation, and pain.   ACTIVITY LIMITATIONS: bending, squatting, stairs, locomotion level, and wellness routine and golf  PARTICIPATION LIMITATIONS: community activity  PERSONAL FACTORS: 3+ comorbidities: a-fib, HTN, CAD are also affecting patient's functional outcome.   REHAB POTENTIAL: Good  CLINICAL DECISION MAKING: Evolving/moderate complexity  EVALUATION COMPLEXITY: Moderate  PLAN:  PT FREQUENCY: 2x/week  PT DURATION: 6 weeks  PLANNED INTERVENTIONS: 02835- PT Re-evaluation,  02249- Physical Performance Testing, 97110-Therapeutic exercises, 97530- Therapeutic activity, [id number]- Neuromuscular re-education, 97535- Self Care, [id number]- Manual therapy, [id number]- Gait training, [phone]- Orthotic Initial, [phone]- Aquatic Therapy, [phone] (1-2 muscles), [phone] (3+ muscles)- Dry Needling, Patient/Family education, Balance training, Taping, Joint mobilization, and Cryotherapy  PLAN FOR NEXT SESSION: Progress to light resistance with ankle mobility as tolerated. Work on stride positioning in standing to accept load through ankle, gait mechanics, LE strengthening, return to gym routine as able.   [name], PTA 11/10/2024, 4:33 PM

## 2024-11-15 ENCOUNTER — Encounter: Payer: Self-pay | Admitting: Rehabilitative and Restorative Service Providers"

## 2024-11-15 ENCOUNTER — Ambulatory Visit: Admitting: Rehabilitative and Restorative Service Providers"

## 2024-11-15 DIAGNOSIS — R2689 Other abnormalities of gait and mobility: Secondary | ICD-10-CM

## 2024-11-15 DIAGNOSIS — M25572 Pain in left ankle and joints of left foot: Secondary | ICD-10-CM | POA: Diagnosis not present

## 2024-11-15 DIAGNOSIS — M6281 Muscle weakness (generalized): Secondary | ICD-10-CM

## 2024-11-15 NOTE — Therapy (Signed)
 OUTPATIENT PHYSICAL THERAPY TREATMENT   Patient Name: [name] MRN: [id number] DOB:09/12/1931, 89 y.o., male Today's Date: 11/15/2024  PCP: Aloysius Mech, MD REFERRING PROVIDER: Adine Birmingham, PA  END OF SESSION:  PT End of Session - 11/15/24 1148     Visit Number 5    Number of Visits 16    Date for Recertification  01/20/25    Authorization Type [employer] medicare--auth to submit    Progress Note Due on Visit 10    PT Start Time 1148    PT Stop Time 1230    PT Time Calculation (min) 42 min    Activity Tolerance Patient tolerated treatment well    Behavior During Therapy [facility] for tasks assessed/performed           Past Medical History:  Diagnosis Date   Anemia    Antral gastritis 2013   EGD    Atrial fibrillation (HCC)    CAD (coronary artery disease)    s/p CABG 1993 (L-LAD, S-RI, S-D1);   echo 1/10: EF 55%;    myoview  12/09: inf MI, no ischemia   Candida esophagitis (HCC) 2013   EGD    Cataracts, bilateral    Family history of malignant neoplasm of gastrointestinal tract    Folliculitis    Hiatal hernia    HTN (hypertension)    Hyperlipidemia    Irritable bladder    Myocardial infarction [facility])    Obesity    Osteoarthritis    Past Surgical History:  Procedure Laterality Date   BLEPHAROPLASTY Bilateral 11/16/2021   upper lid   CATARACT EXTRACTION Right 07/21/2018   CHOLECYSTECTOMY     laparoscopic '92   CORONARY ARTERY BYPASS GRAFT     graft '92: LIMA-LAD, SVG - D2,R1, D1   ENTEROSCOPY  08/22/2012   Procedure: ENTEROSCOPY;  Surgeon: Renaye Sous, MD;  Location: [facility] ENDOSCOPY;  Service: Endoscopy;  Laterality: N/A;   INTRAOCULAR LENS EXCHANGE Right 07/21/2018   KNEE SURGERY     MOLE REMOVAL  2001 and 2008   PAROTIDECTOMY Right 06/18/2021   PILONIDAL CYST EXCISION     Patient Active Problem List   Diagnosis Date Noted   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) 07/26/2024   CKD (chronic kidney disease) stage  3, GFR 30-59 ml/min (HCC) 07/01/2022   Lymphadenopathy, inguinal, right 12/17/2021   Primary osteoarthritis of right knee 07/10/2021   Lumbar spondylosis 04/16/2019   CHF (congestive heart failure) (HCC) 12/17/2016   PCP NOTES >>>>> 10/11/2015   Carpal tunnel syndrome 02/15/2014   Anemia 08/14/2012   Annual physical exam 01/08/2012   Atrial fibrillation (HCC) 03/29/2011   Venous (peripheral) insufficiency 09/04/2009   OBESITY 07/04/2009   DJD (degenerative joint disease) 07/04/2009   SLEEP DISORDER 07/04/2009   DM II (diabetes mellitus, type II), controlled (HCC) 04/25/2008   Hyperlipidemia 06/25/2007   Essential hypertension 06/25/2007   CAD (coronary artery disease) of artery bypass graft 06/25/2007    ONSET DATE: 11/01/24  REFERRING DIAG:  T80.CHERENE (ICD-10-CM) - Fall, initial encounter  S82.832A (ICD-10-CM) - Closed avulsion fracture of distal end of left fibula, initial encounter   THERAPY DIAG:  Pain in left ankle and joints of left foot  Other abnormalities of gait and mobility  Muscle weakness (generalized)  Rationale for Evaluation and Treatment: Rehabilitation  SUBJECTIVE:  SUBJECTIVE STATEMENT:  The patient is no longer having posterior pain in the L ankle-- he is now having some anterior ankle pain ont he L side. He has stiffness worse when first rising after sitting. The L ankle stays swollen.  He is doing the bike, the leg press, and the upper body machines at the gym. He has 2 braces at home-- he put one on last week and was able to hit golf balls without pain.   EVAL: The patient had onset of L ankle pain in early September after golfing. He was seen by urgent care initially and then went to orthopedic PA on 10/16 and then again on 10/21/24--he was found to have fibular  fracture. The patient began weening himself from the boot after his visit 10/21/24 and is no longer wearing it. He arrives today with pain with walking. He attempted to stretch it at home and notes pain worsened.  Pt accompanied by: significant other  PERTINENT HISTORY: a-fib, cancer, GERD, HTN, CAD  PAIN:  Are you having pain? Yes: NPRS scale: 0/10, 5/10 with walking Pain location: posterior leg/ankle Pain description: with weight bearing Aggravating factors: walking Relieving factors: rest  PRECAUTIONS: Fall  WEIGHT BEARING RESTRICTIONS: Yes WBAT  FALLS: Has patient fallen in last 6 months? No  LIVING ENVIRONMENT: Lives with: significant other/roommate Lives in: House/apartment Stairs: No  PLOF: Independent  PATIENT GOALS: reduce pain, return to golf, improve walking  OBJECTIVE:  Note: Objective measures were completed at Evaluation unless otherwise noted.  DIAGNOSTIC FINDINGS: Review of imaging reveals x-rays 3 views of the left ankle from outside facility reveal old fibula distal shaft fracture that has callus formation minimal displacement otherwise patient has osteopenia and ankle arthritic changes with loss of joint space noted X-rays 2 views of the left tib-fib once again reveal old distal fibular shaft fracture otherwise no other fractures noted patient does have what appears to be staples from previous surgical procedure and the medial aspect of the lower leg.  COGNITION: Overall cognitive status: WFLs   SENSATION: WFL  EDEMA:  Figure 8: R is 59.5cm, L is 62 cm   LOWER EXTREMITY ROM:    Active  Right Eval Left Eval Left 11/15/24  Hip flexion     Hip extension     Hip abduction     Hip adduction     Hip internal rotation     Hip external rotation     Knee flexion     Knee extension     Ankle dorsiflexion -5 -5 5  Ankle plantarflexion 45 32 52  Ankle inversion 20 20   Ankle eversion 20 10    (Blank rows = not tested)  LOWER EXTREMITY MMT:   MMT  Right Eval Left Eval  Hip flexion 5/5 5/5  Hip extension    Hip abduction    Hip adduction    Hip internal rotation    Hip external rotation    Knee flexion 4/5 5/5  Knee extension 5/5 5/5  Ankle dorsiflexion 5/5 5/5  Ankle plantarflexion    Ankle inversion    Ankle eversion    (Blank rows = not tested)  BED MOBILITY:  Findings: WFLs  GAIT: Findings: Gait Characteristics: antalgic pattern, shortened step length R LE, Distance walked: 100 ft, and Comments: 20 ft/9.15 seconds=2.19 ft/sec  PATIENT SURVEYS:  PSFS: THE PATIENT SPECIFIC FUNCTIONAL SCALE  Place score of 0-10 (0 = unable to perform activity and 10 = able to perform activity at the same level  as before injury or problem)  Activity Date: 11/01/24    Walking  1    2.Standing 1    Total Score 2/2=1      Total Score = Sum of activity scores/number of activities  Minimally Detectable Change: 3 points (for single activity); 2 points (for average score)  [facility] (nd). The Patient Specific Functional Scale . Retrieved from [email or web address]    [facility] Adult PT Treatment:                                                DATE: 11/15/24 Therapeutic Exercise: See measurements above for ankle ROM Began with seated anterior tibialis stretch -- hard b/c of knee Standing anterior tibialis stretch Manual Therapy: Joint mobilization with subtallar inversion/eversion Joint mobilization with ankle PA and AP mobs, metatarsal mobilization and great toe PROM extension Neuromuscular re-ed: Single leg standing dec'ing UE support-- initially anterior ankle pain, but this improved after stretching Seated Ankle inversion/eversion red TB Ankle PF green band x 12 reps Therapeutic Activity: Nu-step level 4 x 5 minutes Seated BAPS board working on anterior/posterior rocking  Standing anterior ankle stretch  Toe yoga x 10 reps L LE Gait: Walking/gait activities  emphasizing bilateral heel strike and equal stride length Gait characterized by R antalgic pattern due to stiffness in R knee   [facility] Adult PT Treatment:                                                DATE: 11/10/2024 Neuromuscular re-ed: Standing: Hip abduction + red Tb crossed at ankles   Hip extension + red TB crossed at ankles Seated: Ankle eversion & inversion + yellow TB Ankle PF green TB --> blue TB  Paloff press +blue TB Trunk rotation + green TB Therapeutic Activity: NuStep L5 x 5 min to promote ankle dorsiflexion mobility Standing heel raise on Lt (weight bearing on Rt) Modified single leg stance (Lt) + UE support --> Rt kickstand    [facility] Adult PT Treatment:                                                DATE: 11/08/2024 Neuromuscular re-ed: Ankle isometrics --> EV, INV, PF Seated heel raise + 15#KB resting on knee x 20 Therapeutic Activity: NuStep L5 x 5 min to promote ankle dorsiflexion mobility Seated gastroc & soleus stretches with strap --> foot propped on small box Weight shifting in stride stance --> fwd shifting onto Lt LE Standing hip abd & extension x10 each (bil) Seated ankle DF/PF & EV/INV with rocker board Gait: Gait training with focus on heel strike                                                 PATIENT EDUCATION: Education details: Updated HEP Person educated: Patient Education method: Explanation, Demonstration, and Handouts Education comprehension: verbalized understanding, returned demonstration, and needs further education  HOME EXERCISE PROGRAM: Access Code: [email or web address] URL: [email or web address]  Date: 11/10/2024 Prepared by: Lamarr Price  Exercises - Towel Scrunches  - 2 x daily - 7 x weekly - 1 sets - 10 reps - Gastroc Stretch on Wall  - 2 x daily - 5 x weekly - 1 sets - 2 reps - 30 seconds hold - Standing Hip Abduction with Resistance at Ankles and Counter Support  - 1 x daily - 7 x weekly - 3 sets - 10 reps - Seated  Ankle Eversion with Anchored Resistance  - 1 x daily - 7 x weekly - 3 sets - 10 reps - Seated Ankle Inversion with Anchored Resistance  - 1 x daily - 7 x weekly - 3 sets - 10 reps - Seated Ankle Plantar Flexion with Resistance Loop  - 1 x daily - 7 x weekly - 3 sets - 10 reps - Seated Ankle Dorsiflexion with Anchored Resistance  - 1 x daily - 7 x weekly - 3 sets - 10 reps - Single Leg Stance with Support  - 1 x daily - 7 x weekly - 1 sets - 3-5 reps - 10-30 sec hold  GOALS: Goals reviewed with patient? Yes  SHORT TERM GOALS: Target date: 11/30/24  The patient will be indep with initial HEP Baseline: initiated at eval Goal status: INITIAL  2.  The patient will report pain with walking < or equal to 2/10.  Baseline:  6/10 Goal status: PARTIALLY MET-- 5/10 ON 12/8  LONG TERM GOALS: Target date: 12/14/24  The patient will be indep with progression of HEP. Baseline:  initiated at eval Goal status: INITIAL  2.  The patient will return to gym routine for LE bike and machines. Baseline: Unable Goal status: INITIAL  3.  The patient will improve gait speed to > or equal to 2.6 ft/sec to demo improved mobility. Baseline:  2.19 ft/sec Goal status: INITIAL  4.  The patient will report no pain with walking household and limited community distances. Baseline:  6/10 pain. Goal status: INITIAL  5. The patient will improve PSFS from score of 1, to a score of 3.  Baseline: 1  Goal status: INITIAL   ASSESSMENT:  CLINICAL IMPRESSION:  The patient is not having pain in the posterior aspect of the L heel as he was at eval. He is getting soreness in the anterior/superior aspect of the foot/ankle with single limb standing tasks, however this improved after doing standing stretch for anterior tibialis (could not do it seated due to knee pain).  PT also progressed ankle resistance from yellow to red band. Patient has no pain at end of session. Gait is more limited today by R knee pain than L ankle  pain.   EVAL: Patient is a 89 y.o. male who was seen today for physical therapy evaluation and treatment for L closed avulsion fracture distal fibula. He was put in a boot to allow for healing and now notes having heel cord pain with weight bearing activities. He presents with impairments in ROM, gait speed, gait mechanics, strength, and general functional status. Pain is limiting loading of the L LE during gait leading to shortened steps and therefore, increased fall risk. Patient tolerated initial exercises today and was able to demonstrate improved stride length at end of session (gait speed measured after HEP initiated).    OBJECTIVE IMPAIRMENTS: Abnormal gait, decreased activity tolerance, decreased balance, decreased ROM, decreased strength, hypomobility, increased fascial restrictions, impaired sensation, and pain.    PLAN:  PT FREQUENCY: 2x/week  PT DURATION: 6 weeks  PLANNED INTERVENTIONS: 97164- PT Re-evaluation, 97750- Physical Performance Testing, 97110-Therapeutic exercises, 97530- Therapeutic activity, [id number]- Neuromuscular re-education, 97535- Self Care, [id number]- Manual therapy, [id number]- Gait training, [phone]- Orthotic Initial, [phone]- Aquatic Therapy, [phone] (1-2 muscles), [id number] (3+ muscles)- Dry Needling, Patient/Family education, Balance training, Taping, Joint mobilization, and Cryotherapy  PLAN FOR NEXT SESSION: Progress to light resistance with ankle mobility as tolerated. Work on stride positioning in standing to accept load through ankle, gait mechanics, LE strengthening, return to gym routine as able.   Zulay Corrie, PT 11/15/2024, 11:49 AM

## 2024-11-17 ENCOUNTER — Ambulatory Visit

## 2024-11-17 DIAGNOSIS — M25572 Pain in left ankle and joints of left foot: Secondary | ICD-10-CM | POA: Diagnosis not present

## 2024-11-17 DIAGNOSIS — M6281 Muscle weakness (generalized): Secondary | ICD-10-CM

## 2024-11-17 DIAGNOSIS — R2689 Other abnormalities of gait and mobility: Secondary | ICD-10-CM

## 2024-11-17 NOTE — Therapy (Signed)
 OUTPATIENT PHYSICAL THERAPY TREATMENT   Patient Name: [name] MRN: [id number] DOB:24-Mar-1931, 89 y.o., male Today's Date: 11/17/2024  PCP: Aloysius Mech, MD REFERRING PROVIDER: Adine Birmingham, PA  END OF SESSION:  PT End of Session - 11/17/24 1024     Visit Number 6    Number of Visits 16    Date for Recertification  01/20/25    Authorization Type [employer] Medicare    Authorization Time Period 12 VISITS APPROVED FOR PT 11/01/2024-12/13/2024    Authorization - Visit Number 6    Authorization - Number of Visits 12    Progress Note Due on Visit 10    PT Start Time 1019    PT Stop Time 1101    PT Time Calculation (min) 42 min    Activity Tolerance Patient tolerated treatment well    Behavior During Therapy [facility] for tasks assessed/performed         Past Medical History:  Diagnosis Date   Anemia    Antral gastritis 2013   EGD    Atrial fibrillation (HCC)    CAD (coronary artery disease)    s/p CABG 1993 (L-LAD, S-RI, S-D1);   echo 1/10: EF 55%;    myoview  12/09: inf MI, no ischemia   Candida esophagitis (HCC) 2013   EGD    Cataracts, bilateral    Family history of malignant neoplasm of gastrointestinal tract    Folliculitis    Hiatal hernia    HTN (hypertension)    Hyperlipidemia    Irritable bladder    Myocardial infarction [facility])    Obesity    Osteoarthritis    Past Surgical History:  Procedure Laterality Date   BLEPHAROPLASTY Bilateral 11/16/2021   upper lid   CATARACT EXTRACTION Right 07/21/2018   CHOLECYSTECTOMY     laparoscopic '92   CORONARY ARTERY BYPASS GRAFT     graft '92: LIMA-LAD, SVG - D2,R1, D1   ENTEROSCOPY  08/22/2012   Procedure: ENTEROSCOPY;  Surgeon: Renaye Sous, MD;  Location: [facility];  Service: Endoscopy;  Laterality: N/A;   INTRAOCULAR LENS EXCHANGE Right 07/21/2018   KNEE SURGERY     MOLE REMOVAL  2001 and 2008   PAROTIDECTOMY Right 06/18/2021   PILONIDAL CYST EXCISION     Patient Active Problem List   Diagnosis Date Noted    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) 07/26/2024   CKD (chronic kidney disease) stage 3, GFR 30-59 ml/min (HCC) 07/01/2022   Lymphadenopathy, inguinal, right 12/17/2021   Primary osteoarthritis of right knee 07/10/2021   Lumbar spondylosis 04/16/2019   CHF (congestive heart failure) (HCC) 12/17/2016   PCP NOTES >>>>> 10/11/2015   Carpal tunnel syndrome 02/15/2014   Anemia 08/14/2012   Annual physical exam 01/08/2012   Atrial fibrillation (HCC) 03/29/2011   Venous (peripheral) insufficiency 09/04/2009   OBESITY 07/04/2009   DJD (degenerative joint disease) 07/04/2009   SLEEP DISORDER 07/04/2009   DM II (diabetes mellitus, type II), controlled (HCC) 04/25/2008   Hyperlipidemia 06/25/2007   Essential hypertension 06/25/2007   CAD (coronary artery disease) of artery bypass graft 06/25/2007    ONSET DATE: 11/01/24  REFERRING DIAG:  T80.CHERENE (ICD-10-CM) - Fall, initial encounter  S82.832A (ICD-10-CM) - Closed avulsion fracture of distal end of left fibula, initial encounter   THERAPY DIAG:  Pain in left ankle and joints of left foot  Other abnormalities of gait and mobility  Muscle weakness (generalized)  Rationale for Evaluation and Treatment: Rehabilitation  SUBJECTIVE:  SUBJECTIVE STATEMENT:  Patient reports 3/10 pain in anterior ankle when weight bearing in standing and when walking; states he only wears ankle brace when playing golf, which he has not done since last PT visit.   EVAL: The patient had onset of L ankle pain in early September after golfing. He was seen by urgent care initially and then went to orthopedic PA on 10/16 and then again on 10/21/24--he was found to have fibular fracture. The patient began weening himself from the  boot after his visit 10/21/24 and is no longer wearing it. He arrives today with pain with walking. He attempted to stretch it at home and notes pain worsened.  Pt accompanied by: significant other  PERTINENT HISTORY: a-fib, cancer, GERD, HTN, CAD  PAIN:  Are you having pain? Yes: NPRS scale: 0/10, 3/10 with walking Pain location: posterior leg/ankle Pain description: with weight bearing Aggravating factors: walking Relieving factors: rest  PRECAUTIONS: Fall  WEIGHT BEARING RESTRICTIONS: Yes WBAT  FALLS: Has patient fallen in last 6 months? No  LIVING ENVIRONMENT: Lives with: significant other/roommate Lives in: House/apartment Stairs: No  PLOF: Independent  PATIENT GOALS: reduce pain, return to golf, improve walking  OBJECTIVE:  Note: Objective measures were completed at Evaluation unless otherwise noted.  DIAGNOSTIC FINDINGS: Review of imaging reveals x-rays 3 views of the left ankle from outside facility reveal old fibula distal shaft fracture that has callus formation minimal displacement otherwise patient has osteopenia and ankle arthritic changes with loss of joint space noted X-rays 2 views of the left tib-fib once again reveal old distal fibular shaft fracture otherwise no other fractures noted patient does have what appears to be staples from previous surgical procedure and the medial aspect of the lower leg.  COGNITION: Overall cognitive status: WFLs   SENSATION: WFL  EDEMA:  Figure 8: R is 59.5cm, L is 62 cm   LOWER EXTREMITY ROM:    Active  Right Eval Left Eval Left 11/15/24  Hip flexion     Hip extension     Hip abduction     Hip adduction     Hip internal rotation     Hip external rotation     Knee flexion     Knee extension     Ankle dorsiflexion -5 -5 5  Ankle plantarflexion 45 32 52  Ankle inversion 20 20   Ankle eversion 20 10    (Blank rows = not tested)  LOWER EXTREMITY MMT:   MMT Right Eval Left Eval  Hip flexion 5/5 5/5  Hip  extension    Hip abduction    Hip adduction    Hip internal rotation    Hip external rotation    Knee flexion 4/5 5/5  Knee extension 5/5 5/5  Ankle dorsiflexion 5/5 5/5  Ankle plantarflexion    Ankle inversion    Ankle eversion    (Blank rows = not tested)  BED MOBILITY:  Findings: WFLs  GAIT: Findings: Gait Characteristics: antalgic pattern, shortened step length R LE, Distance walked: 100 ft, and Comments: 20 ft/9.15 seconds=2.19 ft/sec  PATIENT SURVEYS:  PSFS: THE PATIENT SPECIFIC FUNCTIONAL SCALE  Place score of 0-10 (0 = unable to perform activity and 10 = able to perform activity at the same level as before injury or problem)  Activity Date: 11/01/24    Walking  1    2.Standing 1    Total Score 2/2=1      Total Score = Sum of activity scores/number of activities  Minimally Detectable  Change: 3 points (for single activity); 2 points (for average score)  [facility] (nd). The Patient Specific Functional Scale . Retrieved from [email or web address]    [facility] Adult PT Treatment:                                                DATE: 11/17/2024 Neuromuscular re-ed: Seated:  Ankle DF with foot on rocker board 10 x 10 sec Ankle ever/inver on rocker board Toe yoga Therapeutic Activity: Standing: Fwd weight shifting onto Lt leg  Lt toe raises --> heel raises Standing anterior ankle stretch 5 x 10 sec Resisted side stepping + red TB crossed at ankles Runner's stretch with Lt leg straight & bent Gait speed (2.5 ft/sec) Walking with focus on heel strike   [facility] Adult PT Treatment:                                                DATE: 11/15/24 Therapeutic Exercise: See measurements above for ankle ROM Began with seated anterior tibialis stretch -- hard b/c of knee Standing anterior tibialis stretch Manual Therapy: Joint mobilization with subtallar inversion/eversion Joint mobilization with ankle PA and AP  mobs, metatarsal mobilization and great toe PROM extension Neuromuscular re-ed: Single leg standing dec'ing UE support-- initially anterior ankle pain, but this improved after stretching Seated Ankle inversion/eversion red TB Ankle PF green band x 12 reps Therapeutic Activity: Nu-step level 4 x 5 minutes Seated BAPS board working on anterior/posterior rocking  Standing anterior ankle stretch  Toe yoga x 10 reps L LE Gait: Walking/gait activities emphasizing bilateral heel strike and equal stride length Gait characterized by R antalgic pattern due to stiffness in R knee   [facility] Adult PT Treatment:                                                DATE: 11/10/2024 Neuromuscular re-ed: Standing: Hip abduction + red Tb crossed at ankles   Hip extension + red TB crossed at ankles Seated: Ankle eversion & inversion + yellow TB Ankle PF green TB --> blue TB  Paloff press +blue TB Trunk rotation + green TB Therapeutic Activity: NuStep L5 x 5 min to promote ankle dorsiflexion mobility Standing heel raise on Lt (weight bearing on Rt) Modified single leg stance (Lt) + UE support --> Rt kickstand    [facility] Adult PT Treatment:                                                DATE: 11/08/2024 Neuromuscular re-ed: Ankle isometrics --> EV, INV, PF Seated heel raise + 15#KB resting on knee x 20 Therapeutic Activity: NuStep L5 x 5 min to promote ankle dorsiflexion mobility Seated gastroc & soleus stretches with strap --> foot propped on small box Weight shifting in stride stance --> fwd shifting onto Lt LE Standing hip abd & extension x10 each (bil) Seated ankle DF/PF & EV/INV with rocker board Gait: Gait training with focus  on heel strike                                                 PATIENT EDUCATION: Education details: Updated HEP Person educated: Patient Education method: Explanation, Demonstration, and Handouts Education comprehension: verbalized understanding, returned demonstration,  and needs further education  HOME EXERCISE PROGRAM: Access Code: [email or web address] URL: [email or web address] Date: 11/17/2024 Prepared by: Lamarr Price  Exercises - Towel Scrunches  - 2 x daily - 7 x weekly - 1 sets - 10 reps - Gastroc Stretch on Wall  - 2 x daily - 5 x weekly - 1 sets - 2 reps - 30 seconds hold - Standing Hip Abduction with Resistance at Ankles and Counter Support  - 1 x daily - 7 x weekly - 3 sets - 10 reps - Seated Ankle Eversion with Anchored Resistance  - 1 x daily - 7 x weekly - 3 sets - 10 reps - Seated Ankle Inversion with Anchored Resistance  - 1 x daily - 7 x weekly - 3 sets - 10 reps - Seated Ankle Plantar Flexion with Resistance Loop  - 1 x daily - 7 x weekly - 3 sets - 10 reps - Seated Ankle Dorsiflexion with Anchored Resistance  - 1 x daily - 7 x weekly - 3 sets - 10 reps - Single Leg Stance with Support  - 1 x daily - 7 x weekly - 1 sets - 3-5 reps - 10-30 sec hold - Toe Yoga - Alternating Great Toe and Lesser Toe Extension  - 1 x daily - 7 x weekly - 3 sets - 10 reps  GOALS: Goals reviewed with patient? Yes  SHORT TERM GOALS: Target date: 11/30/24  The patient will be indep with initial HEP Baseline: initiated at eval Goal status: INITIAL  2.  The patient will report pain with walking < or equal to 2/10.  Baseline:  6/10 Goal status: PARTIALLY MET-- 5/10 ON 12/8  LONG TERM GOALS: Target date: 12/14/24  The patient will be indep with progression of HEP. Baseline:  initiated at eval Goal status: INITIAL  2.  The patient will return to gym routine for LE bike and machines. Baseline: Unable 11/17/24: return to LE exercises at gym 11/08/24 Goal status: MET  3.  The patient will improve gait speed to > or equal to 2.6 ft/sec to demo improved mobility. Baseline:  2.19 ft/sec 11/17/24: 2.5 ft/sec Goal status: IN PROGRESS  4.  The patient will report no pain with walking household and limited community distances. Baseline:  6/10 pain. Goal  status: INITIAL  5. The patient will improve PSFS from score of 1, to a score of 3.  Baseline: 1  Goal status: INITIAL   ASSESSMENT:  CLINICAL IMPRESSION:  Patient is progressing towards gait speed goal, measuring 2.5 ft/sec today. Cueing heel strike during gait decreased anterior ankle pain. Noted swelling greater on medal ankle; recommended patient continue to elevate Lt LE and perform ankle pumps to promote improved circulation. Improved tolerance with increased load on Lt single leg stance with weight shifting forward in stride stance.    EVAL: Patient is a 89 y.o. male who was seen today for physical therapy evaluation and treatment for L closed avulsion fracture distal fibula. He was put in a boot to allow for healing and now notes having heel cord pain with weight  bearing activities. He presents with impairments in ROM, gait speed, gait mechanics, strength, and general functional status. Pain is limiting loading of the L LE during gait leading to shortened steps and therefore, increased fall risk. Patient tolerated initial exercises today and was able to demonstrate improved stride length at end of session (gait speed measured after HEP initiated).    OBJECTIVE IMPAIRMENTS: Abnormal gait, decreased activity tolerance, decreased balance, decreased ROM, decreased strength, hypomobility, increased fascial restrictions, impaired sensation, and pain.    PLAN:  PT FREQUENCY: 2x/week  PT DURATION: 6 weeks  PLANNED INTERVENTIONS: 97164- PT Re-evaluation, 97750- Physical Performance Testing, 97110-Therapeutic exercises, 97530- Therapeutic activity, [id number]- Neuromuscular re-education, 97535- Self Care, [id number]- Manual therapy, [id number]- Gait training, [phone]- Orthotic Initial, [phone]- Aquatic Therapy, [phone] (1-2 muscles), [id number] (3+ muscles)- Dry Needling, Patient/Family education, Balance training, Taping, Joint mobilization, and Cryotherapy  PLAN FOR NEXT SESSION: Progress to light resistance with  ankle mobility as tolerated. Work on stride positioning in standing to accept load through ankle, gait mechanics, LE strengthening, return to gym routine as able.    Lamarr GORMAN Price, PTA 11/17/2024, 11:09 AM

## 2024-11-22 ENCOUNTER — Ambulatory Visit

## 2024-11-22 DIAGNOSIS — M25572 Pain in left ankle and joints of left foot: Secondary | ICD-10-CM | POA: Diagnosis not present

## 2024-11-22 DIAGNOSIS — R2689 Other abnormalities of gait and mobility: Secondary | ICD-10-CM

## 2024-11-22 DIAGNOSIS — M6281 Muscle weakness (generalized): Secondary | ICD-10-CM

## 2024-11-22 NOTE — Therapy (Signed)
 OUTPATIENT PHYSICAL THERAPY TREATMENT   Patient Name: [name] MRN: [id number] DOB:December 31, 1930, 89 y.o., male Today's Date: 11/22/2024  PCP: Aloysius Mech, MD REFERRING PROVIDER: Adine Birmingham, PA  END OF SESSION:  PT End of Session - 11/22/24 1108     Visit Number 7    Number of Visits 16    Date for Recertification  01/20/25    Authorization Type [employer]    Authorization Time Period 12 VISITS APPROVED FOR PT 11/01/2024-12/13/2024    Authorization - Visit Number 7    Authorization - Number of Visits 12    Progress Note Due on Visit 10    PT Start Time 1105    Activity Tolerance Patient tolerated treatment well    Behavior During Therapy [facility] for tasks assessed/performed         Past Medical History:  Diagnosis Date   Anemia    Antral gastritis 2013   EGD    Atrial fibrillation (HCC)    CAD (coronary artery disease)    s/p CABG 1993 (L-LAD, S-RI, S-D1);   echo 1/10: EF 55%;    myoview  12/09: inf MI, no ischemia   Candida esophagitis (HCC) 2013   EGD    Cataracts, bilateral    Family history of malignant neoplasm of gastrointestinal tract    Folliculitis    Hiatal hernia    HTN (hypertension)    Hyperlipidemia    Irritable bladder    Myocardial infarction [facility])    Obesity    Osteoarthritis    Past Surgical History:  Procedure Laterality Date   BLEPHAROPLASTY Bilateral 11/16/2021   upper lid   CATARACT EXTRACTION Right 07/21/2018   CHOLECYSTECTOMY     laparoscopic '92   CORONARY ARTERY BYPASS GRAFT     graft '92: LIMA-LAD, SVG - D2,R1, D1   ENTEROSCOPY  08/22/2012   Procedure: ENTEROSCOPY;  Surgeon: Renaye Sous, MD;  Location: [facility] ENDOSCOPY;  Service: Endoscopy;  Laterality: N/A;   INTRAOCULAR LENS EXCHANGE Right 07/21/2018   KNEE SURGERY     MOLE REMOVAL  2001 and 2008   PAROTIDECTOMY Right 06/18/2021   PILONIDAL CYST EXCISION     Patient Active Problem List   Diagnosis Date Noted   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) 07/26/2024   CKD (chronic kidney disease) stage 3, GFR 30-59 ml/min (HCC) 07/01/2022   Lymphadenopathy, inguinal, right 12/17/2021   Primary osteoarthritis of right knee 07/10/2021   Lumbar spondylosis 04/16/2019   CHF (congestive heart failure) (HCC) 12/17/2016   PCP NOTES >>>>> 10/11/2015   Carpal tunnel syndrome 02/15/2014   Anemia 08/14/2012   Annual physical exam 01/08/2012   Atrial fibrillation (HCC) 03/29/2011   Venous (peripheral) insufficiency 09/04/2009   OBESITY 07/04/2009   DJD (degenerative joint disease) 07/04/2009   SLEEP DISORDER 07/04/2009   DM II (diabetes mellitus, type II), controlled (HCC) 04/25/2008   Hyperlipidemia 06/25/2007   Essential hypertension 06/25/2007   CAD (coronary artery disease) of artery bypass graft 06/25/2007    ONSET DATE: 11/01/24  REFERRING DIAG:  T80.CHERENE (ICD-10-CM) - Fall, initial encounter  S82.832A (ICD-10-CM) - Closed avulsion fracture of distal end of left fibula, initial encounter   THERAPY DIAG:  Pain in left ankle and joints of left foot  Other abnormalities of gait and mobility  Muscle weakness (generalized)  Rationale for Evaluation and Treatment: Rehabilitation  SUBJECTIVE:  SUBJECTIVE STATEMENT:  Patient reports he has minimal pain, mainly soreness at front of the ankle. Patient states he went to the gym and driving range yesterday.  EVAL: The patient had onset of L ankle pain in early September after golfing. He was seen by urgent care initially and then went to orthopedic PA on 10/16 and then again on 10/21/24--he was found to have fibular fracture. The patient began weening himself from the boot after his visit 10/21/24 and is no longer wearing it. He arrives today with pain with walking. He attempted to  stretch it at home and notes pain worsened.  Pt accompanied by: significant other  PERTINENT HISTORY: a-fib, cancer, GERD, HTN, CAD  PAIN:  Are you having pain? Yes: NPRS scale: 0/10, 3/10 with walking Pain location: posterior leg/ankle Pain description: with weight bearing Aggravating factors: walking Relieving factors: rest  PRECAUTIONS: Fall  WEIGHT BEARING RESTRICTIONS: Yes WBAT  FALLS: Has patient fallen in last 6 months? No  LIVING ENVIRONMENT: Lives with: significant other/roommate Lives in: House/apartment Stairs: No  PLOF: Independent  PATIENT GOALS: reduce pain, return to golf, improve walking  OBJECTIVE:  Note: Objective measures were completed at Evaluation unless otherwise noted.  DIAGNOSTIC FINDINGS: Review of imaging reveals x-rays 3 views of the left ankle from outside facility reveal old fibula distal shaft fracture that has callus formation minimal displacement otherwise patient has osteopenia and ankle arthritic changes with loss of joint space noted X-rays 2 views of the left tib-fib once again reveal old distal fibular shaft fracture otherwise no other fractures noted patient does have what appears to be staples from previous surgical procedure and the medial aspect of the lower leg.  COGNITION: Overall cognitive status: WFLs   SENSATION: WFL  EDEMA:  Figure 8: R is 59.5cm, L is 62 cm   LOWER EXTREMITY ROM:    Active  Right Eval Left Eval Left 11/15/24  Hip flexion     Hip extension     Hip abduction     Hip adduction     Hip internal rotation     Hip external rotation     Knee flexion     Knee extension     Ankle dorsiflexion -5 -5 5  Ankle plantarflexion 45 32 52  Ankle inversion 20 20   Ankle eversion 20 10    (Blank rows = not tested)  LOWER EXTREMITY MMT:   MMT Right Eval Left Eval  Hip flexion 5/5 5/5  Hip extension    Hip abduction    Hip adduction    Hip internal rotation    Hip external rotation    Knee flexion 4/5  5/5  Knee extension 5/5 5/5  Ankle dorsiflexion 5/5 5/5  Ankle plantarflexion    Ankle inversion    Ankle eversion    (Blank rows = not tested)  BED MOBILITY:  Findings: WFLs  GAIT: Findings: Gait Characteristics: antalgic pattern, shortened step length R LE, Distance walked: 100 ft, and Comments: 20 ft/9.15 seconds=2.19 ft/sec  PATIENT SURVEYS:  PSFS: THE PATIENT SPECIFIC FUNCTIONAL SCALE  Place score of 0-10 (0 = unable to perform activity and 10 = able to perform activity at the same level as before injury or problem)  Activity Date: 11/01/24 11/22/24   Walking  1 8   2.Standing 1 10   Total Score 2/2=1 18/2 =9     Total Score = Sum of activity scores/number of activities  Minimally Detectable Change: 3 points (for single activity); 2 points (for  average score)  [facility] (nd). The Patient Specific Functional Scale . Retrieved from [email or web address]    [facility] Adult PT Treatment:                                                DATE: 11/22/2024 Therapeutic Exercise: NuStep L6 x 5 min Standing gastroc stretch Neuromuscular re-ed: Alternating heel/toe raises with 2 sec hold x 15 Standing resisted hip abd & ext + green TB around ankles x12 each  Standing on airex: Static balance + head turns/nods (posterior LOB with look up) Lateral weight shifting marching Therapeutic Activity: Staggered stance forward weight shifting onto Lt LE Standing on rocker board: Side to side rocking Standing balance on neutral board 2 x 30 sec    [facility] Adult PT Treatment:                                                DATE: 11/17/2024 Neuromuscular re-ed: Seated:  Ankle DF with foot on rocker board 10 x 10 sec Ankle ever/inver on rocker board Toe yoga Therapeutic Activity: Standing: Fwd weight shifting onto Lt leg  Lt toe raises --> heel raises Standing anterior ankle stretch 5 x 10 sec Resisted side stepping + red  TB crossed at ankles Runner's stretch with Lt leg straight & bent Gait speed (2.5 ft/sec) Walking with focus on heel strike   [facility] Adult PT Treatment:                                                DATE: 11/15/24 Therapeutic Exercise: See measurements above for ankle ROM Began with seated anterior tibialis stretch -- hard b/c of knee Standing anterior tibialis stretch Manual Therapy: Joint mobilization with subtallar inversion/eversion Joint mobilization with ankle PA and AP mobs, metatarsal mobilization and great toe PROM extension Neuromuscular re-ed: Single leg standing dec'ing UE support-- initially anterior ankle pain, but this improved after stretching Seated Ankle inversion/eversion red TB Ankle PF green band x 12 reps Therapeutic Activity: Nu-step level 4 x 5 minutes Seated BAPS board working on anterior/posterior rocking  Standing anterior ankle stretch  Toe yoga x 10 reps L LE Gait: Walking/gait activities emphasizing bilateral heel strike and equal stride length Gait characterized by R antalgic pattern due to stiffness in R knee                                                PATIENT EDUCATION: Education details: Updated HEP Person educated: Patient Education method: Programmer, Multimedia, Facilities Manager, and Handouts Education comprehension: verbalized understanding, returned demonstration, and needs further education  HOME EXERCISE PROGRAM: Access Code: [email or web address] URL: [email or web address] Date: 11/22/2024 Prepared by: Lamarr Price  Exercises - Towel Scrunches  - 2 x daily - 7 x weekly - 1 sets - 10 reps - Gastroc Stretch on Wall  - 2 x daily - 5 x weekly - 1 sets - 2 reps - 30 seconds hold - Standing Hip Abduction with Resistance at  Ankles and Counter Support  - 1 x daily - 7 x weekly - 3 sets - 10 reps - Seated Ankle Eversion with Anchored Resistance  - 1 x daily - 7 x weekly - 3 sets - 10 reps - Seated Ankle Inversion with Anchored Resistance  - 1 x  daily - 7 x weekly - 3 sets - 10 reps - Seated Ankle Plantar Flexion with Resistance Loop  - 1 x daily - 7 x weekly - 3 sets - 10 reps - Seated Ankle Dorsiflexion with Anchored Resistance  - 1 x daily - 7 x weekly - 3 sets - 10 reps - Single Leg Stance with Support  - 1 x daily - 7 x weekly - 1 sets - 3-5 reps - 10-30 sec hold - Toe Yoga - Alternating Great Toe and Lesser Toe Extension  - 1 x daily - 7 x weekly - 3 sets - 10 reps - Standing Hip Extension with Resistance at Ankles and Counter Support  - 1 x daily - 7 x weekly - 3 sets - 10 reps - Standing Romberg to 3/4 Tandem Stance  - 2 x daily - 7 x weekly - 1 sets - 3 reps - 10-20 sec hold  GOALS: Goals reviewed with patient? Yes  SHORT TERM GOALS: Target date: 11/30/24  The patient will be indep with initial HEP Baseline: initiated at eval Goal status: INITIAL  2.  The patient will report pain with walking < or equal to 2/10.  Baseline:  6/10 Goal status: PARTIALLY MET-- 5/10 ON 12/8  LONG TERM GOALS: Target date: 12/14/24  The patient will be indep with progression of HEP. Baseline:  initiated at eval Goal status: INITIAL  2.  The patient will return to gym routine for LE bike and machines. Baseline: Unable 11/17/24: return to LE exercises at gym 11/08/24 Goal status: MET  3.  The patient will improve gait speed to > or equal to 2.6 ft/sec to demo improved mobility. Baseline:  2.19 ft/sec 11/17/24: 2.5 ft/sec Goal status: IN PROGRESS  4.  The patient will report no pain with walking household and limited community distances. Baseline:  6/10 pain 11/22/24: 3/10 pain Goal status: IN PROGRESS  5. The patient will improve PSFS from score of 1, to a score of 3.  Baseline: 1 11/22/24: 9  Goal status: MET   ASSESSMENT:  CLINICAL IMPRESSION: Incorporated balance activities on unstable surface to challenge reactive strategies; posterior LOB when looking up (therapist spotting from behind and provided contact guard assist  to regain balance). Patient is making steady progress to wards goals in POC, most notably with decreased pain when walking.   EVAL: Patient is a 89 y.o. male who was seen today for physical therapy evaluation and treatment for L closed avulsion fracture distal fibula. He was put in a boot to allow for healing and now notes having heel cord pain with weight bearing activities. He presents with impairments in ROM, gait speed, gait mechanics, strength, and general functional status. Pain is limiting loading of the L LE during gait leading to shortened steps and therefore, increased fall risk. Patient tolerated initial exercises today and was able to demonstrate improved stride length at end of session (gait speed measured after HEP initiated).    OBJECTIVE IMPAIRMENTS: Abnormal gait, decreased activity tolerance, decreased balance, decreased ROM, decreased strength, hypomobility, increased fascial restrictions, impaired sensation, and pain.    PLAN:  PT FREQUENCY: 2x/week  PT DURATION: 6 weeks  PLANNED INTERVENTIONS: 02835-  PT Re-evaluation, 97750- Physical Performance Testing, 97110-Therapeutic exercises, 97530- Therapeutic activity, [id number]- Neuromuscular re-education, 97535- Self Care, [id number]- Manual therapy, [phone]- Gait training, [phone]- Orthotic Initial, [phone]- Aquatic Therapy, [phone] (1-2 muscles), [phone] (3+ muscles)- Dry Needling, Patient/Family education, Balance training, Taping, Joint mobilization, and Cryotherapy  PLAN FOR NEXT SESSION: Progress to light resistance with ankle mobility as tolerated. Work on stride positioning in standing to accept load through ankle, gait mechanics, LE strengthening, return to gym routine as able.    Lamarr GORMAN Price, PTA 11/22/2024, 11:09 AM

## 2024-11-24 ENCOUNTER — Ambulatory Visit

## 2024-11-24 DIAGNOSIS — R2689 Other abnormalities of gait and mobility: Secondary | ICD-10-CM

## 2024-11-24 DIAGNOSIS — M25572 Pain in left ankle and joints of left foot: Secondary | ICD-10-CM

## 2024-11-24 DIAGNOSIS — M6281 Muscle weakness (generalized): Secondary | ICD-10-CM

## 2024-11-24 NOTE — Therapy (Signed)
 OUTPATIENT PHYSICAL THERAPY TREATMENT   Patient Name: [name] MRN: [id number] DOB:29-May-1931, 89 y.o., male Today's Date: 11/24/2024  PCP: Aloysius Mech, MD REFERRING PROVIDER: Adine Birmingham, PA  END OF SESSION:  PT End of Session - 11/24/24 1111     Visit Number 8    Number of Visits 16    Date for Recertification  01/20/25    Authorization Type [employer]    Authorization Time Period 12 VISITS APPROVED FOR PT 11/01/2024-12/13/2024    Authorization - Visit Number 8    Authorization - Number of Visits 12    Progress Note Due on Visit 10    PT Start Time 1108    PT Stop Time 1148    PT Time Calculation (min) 40 min    Activity Tolerance Patient tolerated treatment well    Behavior During Therapy [facility] for tasks assessed/performed         Past Medical History:  Diagnosis Date   Anemia    Antral gastritis 2013   EGD    Atrial fibrillation (HCC)    CAD (coronary artery disease)    s/p CABG 1993 (L-LAD, S-RI, S-D1);   echo 1/10: EF 55%;    myoview  12/09: inf MI, no ischemia   Candida esophagitis (HCC) 2013   EGD    Cataracts, bilateral    Family history of malignant neoplasm of gastrointestinal tract    Folliculitis    Hiatal hernia    HTN (hypertension)    Hyperlipidemia    Irritable bladder    Myocardial infarction [facility])    Obesity    Osteoarthritis    Past Surgical History:  Procedure Laterality Date   BLEPHAROPLASTY Bilateral 11/16/2021   upper lid   CATARACT EXTRACTION Right 07/21/2018   CHOLECYSTECTOMY     laparoscopic '92   CORONARY ARTERY BYPASS GRAFT     graft '92: LIMA-LAD, SVG - D2,R1, D1   ENTEROSCOPY  08/22/2012   Procedure: ENTEROSCOPY;  Surgeon: Renaye Sous, MD;  Location: [facility];  Service: Endoscopy;  Laterality: N/A;   INTRAOCULAR LENS EXCHANGE Right 07/21/2018   KNEE SURGERY     MOLE REMOVAL  2001 and 2008   PAROTIDECTOMY Right 06/18/2021   PILONIDAL CYST EXCISION     Patient Active Problem List   Diagnosis Date Noted    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) 07/26/2024   CKD (chronic kidney disease) stage 3, GFR 30-59 ml/min (HCC) 07/01/2022   Lymphadenopathy, inguinal, right 12/17/2021   Lumbar spondylosis 04/16/2019   CHF (congestive heart failure) (HCC) 12/17/2016   PCP NOTES >>>>> 10/11/2015   Anemia 08/14/2012   Annual physical exam 01/08/2012   Atrial fibrillation (HCC) 03/29/2011   Venous (peripheral) insufficiency 09/04/2009   OBESITY 07/04/2009   DJD (degenerative joint disease) 07/04/2009   SLEEP DISORDER 07/04/2009   DM II (diabetes mellitus, type II), controlled (HCC) 04/25/2008   Hyperlipidemia 06/25/2007   Essential hypertension 06/25/2007   CAD (coronary artery disease) of artery bypass graft 06/25/2007    ONSET DATE: 11/01/24  REFERRING DIAG:  T80.CHERENE (ICD-10-CM) - Fall, initial encounter  S82.832A (ICD-10-CM) - Closed avulsion fracture of distal end of left fibula, initial encounter   THERAPY DIAG:  Pain in left ankle and joints of left foot  Other abnormalities of gait and mobility  Muscle weakness (generalized)  Rationale for Evaluation and Treatment: Rehabilitation  SUBJECTIVE:  SUBJECTIVE STATEMENT:  Patient reports 2/10 pain in ankle today; states he went to driving range and felt off-balance with brace on Lt ankle and nothing on Rt - thinks he will try wearing braces on both ankles to feel more balanced.  EVAL: The patient had onset of L ankle pain in early September after golfing. He was seen by urgent care initially and then went to orthopedic PA on 10/16 and then again on 10/21/24--he was found to have fibular fracture. The patient began weening himself from the boot after his visit 10/21/24 and is no longer wearing it. He arrives  today with pain with walking. He attempted to stretch it at home and notes pain worsened.  Pt accompanied by: significant other  PERTINENT HISTORY: a-fib, cancer, GERD, HTN, CAD  PAIN:  Are you having pain? Yes: NPRS scale: 2/10 with walking Pain location: posterior leg/ankle Pain description: with weight bearing Aggravating factors: walking Relieving factors: rest  PRECAUTIONS: Fall  WEIGHT BEARING RESTRICTIONS: Yes WBAT  FALLS: Has patient fallen in last 6 months? No  LIVING ENVIRONMENT: Lives with: significant other/roommate Lives in: House/apartment Stairs: No  PLOF: Independent  PATIENT GOALS: reduce pain, return to golf, improve walking  OBJECTIVE:  Note: Objective measures were completed at Evaluation unless otherwise noted.  DIAGNOSTIC FINDINGS: Review of imaging reveals x-rays 3 views of the left ankle from outside facility reveal old fibula distal shaft fracture that has callus formation minimal displacement otherwise patient has osteopenia and ankle arthritic changes with loss of joint space noted X-rays 2 views of the left tib-fib once again reveal old distal fibular shaft fracture otherwise no other fractures noted patient does have what appears to be staples from previous surgical procedure and the medial aspect of the lower leg.  COGNITION: Overall cognitive status: WFLs   SENSATION: WFL  EDEMA:  Figure 8: R is 59.5cm, L is 62 cm   LOWER EXTREMITY ROM:    Active  Right Eval Left Eval Left 11/15/24  Hip flexion     Hip extension     Hip abduction     Hip adduction     Hip internal rotation     Hip external rotation     Knee flexion     Knee extension     Ankle dorsiflexion -5 -5 5  Ankle plantarflexion 45 32 52  Ankle inversion 20 20   Ankle eversion 20 10    (Blank rows = not tested)  LOWER EXTREMITY MMT:   MMT Right Eval Left Eval  Hip flexion 5/5 5/5  Hip extension    Hip abduction    Hip adduction    Hip internal rotation    Hip  external rotation    Knee flexion 4/5 5/5  Knee extension 5/5 5/5  Ankle dorsiflexion 5/5 5/5  Ankle plantarflexion    Ankle inversion    Ankle eversion    (Blank rows = not tested)  BED MOBILITY:  Findings: WFLs  GAIT: Findings: Gait Characteristics: antalgic pattern, shortened step length R LE, Distance walked: 100 ft, and Comments: 20 ft/9.15 seconds=2.19 ft/sec  PATIENT SURVEYS:  PSFS: THE PATIENT SPECIFIC FUNCTIONAL SCALE  Place score of 0-10 (0 = unable to perform activity and 10 = able to perform activity at the same level as before injury or problem)  Activity Date: 11/01/24 11/22/24   Walking  1 8   2.Standing 1 10   Total Score 2/2=1 18/2 =9     Total Score = Sum of activity scores/number  of activities  Minimally Detectable Change: 3 points (for single activity); 2 points (for average score)  [facility] (nd). The Patient Specific Functional Scale . Retrieved from [email or web address]    [facility] Adult PT Treatment:                                                DATE: 11/24/2024 Therapeutic Exercise: NuStep L6 x 5 min Standing gastroc stretch Seated anterior tibialis stretch --> assisted with ball for positioning Neuromuscular re-ed: Resisted ankle:  Eversion + yellow TB 2 x 10 Inversion + yellow TB 2 x 10 PF + blue TB 2 x 10 DF + red TB 2 x 10 Single leg balance + UE support at wall --> added head turns/nods Therapeutic Activity: Towel scrunches  Standing heel raises 3/4 tandem stance balance + head turns/nods Golfer swing with green TB (bil)   [facility] Adult PT Treatment:                                                DATE: 11/22/2024 Therapeutic Exercise: NuStep L6 x 5 min Standing gastroc stretch Neuromuscular re-ed: Alternating heel/toe raises with 2 sec hold x 15 Standing resisted hip abd & ext + green TB around ankles x12 each  Standing on airex: Static balance + head turns/nods  (posterior LOB with look up) Lateral weight shifting marching Therapeutic Activity: Staggered stance forward weight shifting onto Lt LE Standing on rocker board: Side to side rocking Standing balance on neutral board 2 x 30 sec    [facility] Adult PT Treatment:                                                DATE: 11/17/2024 Neuromuscular re-ed: Seated:  Ankle DF with foot on rocker board 10 x 10 sec Ankle ever/inver on rocker board Toe yoga Therapeutic Activity: Standing: Fwd weight shifting onto Lt leg  Lt toe raises --> heel raises Standing anterior ankle stretch 5 x 10 sec Resisted side stepping + red TB crossed at ankles Runner's stretch with Lt leg straight & bent Gait speed (2.5 ft/sec) Walking with focus on heel strike                                               PATIENT EDUCATION: Education details: Updated HEP Person educated: Patient Education method: Programmer, Multimedia, Demonstration, and Handouts Education comprehension: verbalized understanding, returned demonstration, and needs further education  HOME EXERCISE PROGRAM: Access Code: [email or web address] URL: [email or web address] Date: 11/24/2024 Prepared by: Lamarr Price  Exercises - Gastroc Stretch on Wall  - 2 x daily - 5 x weekly - 1 sets - 2 reps - 30 seconds hold - Standing Hip Abduction with Resistance at Ankles and Counter Support  - 1 x daily - 7 x weekly - 3 sets - 10 reps - Seated Ankle Eversion with Anchored Resistance  - 1 x daily - 7 x weekly - 3 sets - 10 reps - Seated Ankle  Inversion with Anchored Resistance  - 1 x daily - 7 x weekly - 3 sets - 10 reps - Seated Ankle Plantar Flexion with Resistance Loop  - 1 x daily - 7 x weekly - 3 sets - 10 reps - Seated Ankle Dorsiflexion with Anchored Resistance  - 1 x daily - 7 x weekly - 3 sets - 10 reps - Single Leg Stance with Support  - 1 x daily - 7 x weekly - 1 sets - 3-5 reps - 10-30 sec hold - Toe Yoga - Alternating Great Toe and Lesser Toe Extension  -  1 x daily - 7 x weekly - 3 sets - 10 reps - Standing Hip Extension with Resistance at Ankles and Counter Support  - 1 x daily - 7 x weekly - 3 sets - 10 reps - Standing Romberg to 3/4 Tandem Stance  - 2 x daily - 7 x weekly - 1 sets - 3 reps - 10-20 sec hold - Single Leg Stance with Support  - 1-2 x daily - 7 x weekly - 1 sets - 3 reps - 30 sec hold - Seated Anterior Tibialis Stretch  - 2 x daily - 7 x weekly - 1 sets - 3-5 reps - 10-20 sec hold  GOALS: Goals reviewed with patient? Yes  SHORT TERM GOALS: Target date: 11/30/24  The patient will be indep with initial HEP Baseline: initiated at eval 11/24/24: MET Goal status: INITIAL  2.  The patient will report pain with walking < or equal to 2/10.  Baseline:  6/10 11/24/24: 2.10 Goal status: MET  LONG TERM GOALS: Target date: 12/14/24  The patient will be indep with progression of HEP. Baseline:  initiated at eval Goal status: INITIAL  2.  The patient will return to gym routine for LE bike and machines. Baseline: Unable 11/17/24: return to LE exercises at gym 11/08/24 Goal status: MET  3.  The patient will improve gait speed to > or equal to 2.6 ft/sec to demo improved mobility. Baseline:  2.19 ft/sec 11/17/24: 2.5 ft/sec Goal status: IN PROGRESS  4.  The patient will report no pain with walking household and limited community distances. Baseline:  6/10 pain 11/22/24: 3/10 pain Goal status: IN PROGRESS  5. The patient will improve PSFS from score of 1, to a score of 3.  Baseline: 1 11/22/24: 9  Goal status: MET   ASSESSMENT:  CLINICAL IMPRESSION:  Progressed single leg stability and increased weight loading on Lt LE with upper extremity support to maintain balance; patient reports pain increased from 2/10 to 4/10 pain at anterior ankle. Anterior tibialis stretch decreased pain and alleviated anterior discomfort; exercise added to HEP. Patient continues to be challenged with narrow base of support and dynamic balance  activities with head turns/nods.  EVAL: Patient is a 89 y.o. male who was seen today for physical therapy evaluation and treatment for L closed avulsion fracture distal fibula. He was put in a boot to allow for healing and now notes having heel cord pain with weight bearing activities. He presents with impairments in ROM, gait speed, gait mechanics, strength, and general functional status. Pain is limiting loading of the L LE during gait leading to shortened steps and therefore, increased fall risk. Patient tolerated initial exercises today and was able to demonstrate improved stride length at end of session (gait speed measured after HEP initiated).    OBJECTIVE IMPAIRMENTS: Abnormal gait, decreased activity tolerance, decreased balance, decreased ROM, decreased strength, hypomobility, increased fascial restrictions,  impaired sensation, and pain.    PLAN:  PT FREQUENCY: 2x/week  PT DURATION: 6 weeks  PLANNED INTERVENTIONS: [id number]- PT Re-evaluation, 97750- Physical Performance Testing, 97110-Therapeutic exercises, 97530- Therapeutic activity, [id number]- Neuromuscular re-education, 97535- Self Care, [id number]- Manual therapy, [id number]- Gait training, [phone]- Orthotic Initial, [phone]- Aquatic Therapy, [phone] (1-2 muscles), [id number] (3+ muscles)- Dry Needling, Patient/Family education, Balance training, Taping, Joint mobilization, and Cryotherapy  PLAN FOR NEXT SESSION: Progress to light resistance with ankle mobility as tolerated. Progress gait mechanics, LE strengthening, return to gym routine as able.    Lamarr GORMAN Price, PTA 11/24/2024, 11:59 AM

## 2024-11-29 ENCOUNTER — Ambulatory Visit

## 2024-11-29 DIAGNOSIS — M25572 Pain in left ankle and joints of left foot: Secondary | ICD-10-CM | POA: Diagnosis not present

## 2024-11-29 DIAGNOSIS — M6281 Muscle weakness (generalized): Secondary | ICD-10-CM

## 2024-11-29 DIAGNOSIS — R2689 Other abnormalities of gait and mobility: Secondary | ICD-10-CM

## 2024-11-29 NOTE — Therapy (Addendum)
 " OUTPATIENT PHYSICAL THERAPY TREATMENT   Patient Name: [name] MRN: [id number] DOB:September 22, 1931, 89 y.o., male Today's Date: 12/01/2024  PCP: Aloysius Mech, MD REFERRING PROVIDER: Adine Birmingham, PA  END OF SESSION:   PT End of Session - 11/29/24 1111       Visit Number 9    Number of Visits 16     Date for Recertification  12/13/24     Authorization Type [employer] Medicare     Authorization Time Period 12 VISITS APPROVED FOR PT 11/01/2024-12/13/2024     Authorization - Visit Number 9    Authorization - Number of Visits 12     Progress Note Due on Visit 10     PT Start Time 1315    PT Stop Time 1400    PT Time Calculation (min) 45 min     Activity Tolerance Patient tolerated treatment well     Behavior During Therapy [facility] for tasks assessed/performed       Past Medical History:  Diagnosis Date   Anemia    Antral gastritis 2013   EGD    Atrial fibrillation (HCC)    CAD (coronary artery disease)    s/p CABG 1993 (L-LAD, S-RI, S-D1);   echo 1/10: EF 55%;    myoview  12/09: inf MI, no ischemia   Candida esophagitis (HCC) 2013   EGD    Cataracts, bilateral    Family history of malignant neoplasm of gastrointestinal tract    Folliculitis    Hiatal hernia    HTN (hypertension)    Hyperlipidemia    Irritable bladder    Myocardial infarction [facility]    Obesity    Osteoarthritis    Past Surgical History:  Procedure Laterality Date   BLEPHAROPLASTY Bilateral 11/16/2021   upper lid   CATARACT EXTRACTION Right 07/21/2018   CHOLECYSTECTOMY     laparoscopic '92   CORONARY ARTERY BYPASS GRAFT     graft '92: LIMA-LAD, SVG - D2,R1, D1   ENTEROSCOPY  08/22/2012   Procedure: ENTEROSCOPY;  Surgeon: Renaye Sous, MD;  Location: [facility];  Service: Endoscopy;  Laterality: N/A;   INTRAOCULAR LENS EXCHANGE Right 07/21/2018   KNEE SURGERY     MOLE REMOVAL  2001 and 2008   PAROTIDECTOMY Right 06/18/2021   PILONIDAL CYST EXCISION     Patient Active Problem List   Diagnosis Date Noted    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) 07/26/2024   CKD (chronic kidney disease) stage 3, GFR 30-59 ml/min (HCC) 07/01/2022   Lymphadenopathy, inguinal, right 12/17/2021   Lumbar spondylosis 04/16/2019   CHF (congestive heart failure) (HCC) 12/17/2016   PCP NOTES >>>>> 10/11/2015   Anemia 08/14/2012   Annual physical exam 01/08/2012   Atrial fibrillation (HCC) 03/29/2011   Venous (peripheral) insufficiency 09/04/2009   OBESITY 07/04/2009   DJD (degenerative joint disease) 07/04/2009   SLEEP DISORDER 07/04/2009   DM II (diabetes mellitus, type II), controlled (HCC) 04/25/2008   Hyperlipidemia 06/25/2007   Essential hypertension 06/25/2007   CAD (coronary artery disease) of artery bypass graft 06/25/2007    ONSET DATE: 11/01/24  REFERRING DIAG:  T80.CHERENE (ICD-10-CM) - Fall, initial encounter  S82.832A (ICD-10-CM) - Closed avulsion fracture of distal end of left fibula, initial encounter   THERAPY DIAG:  Pain in left ankle and joints of left foot  Other abnormalities of gait and mobility  Muscle weakness (generalized)  Rationale for Evaluation and Treatment: Rehabilitation  SUBJECTIVE:  SUBJECTIVE STATEMENT:  Patient reports 4/10 pain in anterior ankle today; states he went to driving range  without ankle brace and it felt good. Patient states he was at the farmer's market over there weekend where he did a lot of standing and had pain when he was walking back to the car but was fine once he sat down in car.  EVAL: The patient had onset of L ankle pain in early September after golfing. He was seen by urgent care initially and then went to orthopedic PA on 10/16 and then again on 10/21/24--he was found to have fibular fracture. The patient began  weening himself from the boot after his visit 10/21/24 and is no longer wearing it. He arrives today with pain with walking. He attempted to stretch it at home and notes pain worsened.  Pt accompanied by: significant other  PERTINENT HISTORY: a-fib, cancer, GERD, HTN, CAD  PAIN:  Are you having pain? Yes: NPRS scale: 4/10 with walking Pain location: posterior leg/ankle Pain description: with weight bearing Aggravating factors: walking Relieving factors: rest  PRECAUTIONS: Fall  WEIGHT BEARING RESTRICTIONS: Yes WBAT  FALLS: Has patient fallen in last 6 months? No  LIVING ENVIRONMENT: Lives with: significant other/roommate Lives in: House/apartment Stairs: No  PLOF: Independent  PATIENT GOALS: reduce pain, return to golf, improve walking  OBJECTIVE:  Note: Objective measures were completed at Evaluation unless otherwise noted.  DIAGNOSTIC FINDINGS: Review of imaging reveals x-rays 3 views of the left ankle from outside facility reveal old fibula distal shaft fracture that has callus formation minimal displacement otherwise patient has osteopenia and ankle arthritic changes with loss of joint space noted X-rays 2 views of the left tib-fib once again reveal old distal fibular shaft fracture otherwise no other fractures noted patient does have what appears to be staples from previous surgical procedure and the medial aspect of the lower leg.  COGNITION: Overall cognitive status: WFLs   SENSATION: WFL  EDEMA:  Figure 8: R is 59.5cm, L is 62 cm   LOWER EXTREMITY ROM:    Active  Right Eval Left Eval Left 11/15/24  Hip flexion     Hip extension     Hip abduction     Hip adduction     Hip internal rotation     Hip external rotation     Knee flexion     Knee extension     Ankle dorsiflexion -5 -5 5  Ankle plantarflexion 45 32 52  Ankle inversion 20 20   Ankle eversion 20 10    (Blank rows = not tested)  LOWER EXTREMITY MMT:   MMT Right Eval Left Eval  Hip flexion  5/5 5/5  Hip extension    Hip abduction    Hip adduction    Hip internal rotation    Hip external rotation    Knee flexion 4/5 5/5  Knee extension 5/5 5/5  Ankle dorsiflexion 5/5 5/5  Ankle plantarflexion    Ankle inversion    Ankle eversion    (Blank rows = not tested)  BED MOBILITY:  Findings: WFLs  GAIT: Findings: Gait Characteristics: antalgic pattern, shortened step length R LE, Distance walked: 100 ft, and Comments: 20 ft/9.15 seconds=2.19 ft/sec  PATIENT SURVEYS:  PSFS: THE PATIENT SPECIFIC FUNCTIONAL SCALE  Place score of 0-10 (0 = unable to perform activity and 10 = able to perform activity at the same level as before injury or problem)  Activity Date: 11/01/24 11/22/24   Walking  1 8  2.Standing 1 10   Total Score 2/2=1 18/2 =9     Total Score = Sum of activity scores/number of activities  Minimally Detectable Change: 3 points (for single activity); 2 points (for average score)  [facility] (nd). The Patient Specific Functional Scale . Retrieved from [email or web address]    [facility] Adult PT Treatment:                                                DATE: 11/29/2024 Neuromuscular re-ed: Ankle PF + black TB x15 Ankle DF + red TB x 10 --> green TB x 10 Rocker board: Fwd/bkwd rocking (ankle PF/DF) x 2 rounds Neutral board balance x 2 rounds  Therapeutic Activity: Seated anterior tib stretch with assist from ball for positioning Airex: Rhomberg stance  Added head turns & nods   [facility] Adult PT Treatment:                                                DATE: 11/24/2024 Therapeutic Exercise: NuStep L6 x 5 min Standing gastroc stretch Seated anterior tibialis stretch --> assisted with ball for positioning Neuromuscular re-ed: Resisted ankle:  Eversion + yellow TB 2 x 10 Inversion + yellow TB 2 x 10 PF + blue TB 2 x 10 DF + red TB 2 x 10 Single leg balance + UE support at wall --> added head  turns/nods Therapeutic Activity: Towel scrunches  Standing heel raises 3/4 tandem stance balance + head turns/nods Golfer swing with green TB (bil)   [facility] Adult PT Treatment:                                                DATE: 11/22/2024 Therapeutic Exercise: NuStep L6 x 5 min Standing gastroc stretch Neuromuscular re-ed: Alternating heel/toe raises with 2 sec hold x 15 Standing resisted hip abd & ext + green TB around ankles x12 each  Standing on airex: Static balance + head turns/nods (posterior LOB with look up) Lateral weight shifting marching Therapeutic Activity: Staggered stance forward weight shifting onto Lt LE Standing on rocker board: Side to side rocking Standing balance on neutral board 2 x 30 sec                                               PATIENT EDUCATION: Education details: Updated HEP Person educated: Patient Education method: Programmer, Multimedia, Demonstration, and Handouts Education comprehension: verbalized understanding, returned demonstration, and needs further education  HOME EXERCISE PROGRAM: Access Code: [email or web address] URL: [email or web address] Date: 11/24/2024 Prepared by: Lamarr Price  Exercises - Gastroc Stretch on Wall  - 2 x daily - 5 x weekly - 1 sets - 2 reps - 30 seconds hold - Standing Hip Abduction with Resistance at Ankles and Counter Support  - 1 x daily - 7 x weekly - 3 sets - 10 reps - Seated Ankle Eversion with Anchored Resistance  - 1 x daily - 7 x weekly - 3 sets - 10  reps - Seated Ankle Inversion with Anchored Resistance  - 1 x daily - 7 x weekly - 3 sets - 10 reps - Seated Ankle Plantar Flexion with Resistance Loop  - 1 x daily - 7 x weekly - 3 sets - 10 reps - Seated Ankle Dorsiflexion with Anchored Resistance  - 1 x daily - 7 x weekly - 3 sets - 10 reps - Single Leg Stance with Support  - 1 x daily - 7 x weekly - 1 sets - 3-5 reps - 10-30 sec hold - Toe Yoga - Alternating Great Toe and Lesser Toe Extension  - 1 x daily  - 7 x weekly - 3 sets - 10 reps - Standing Hip Extension with Resistance at Ankles and Counter Support  - 1 x daily - 7 x weekly - 3 sets - 10 reps - Standing Romberg to 3/4 Tandem Stance  - 2 x daily - 7 x weekly - 1 sets - 3 reps - 10-20 sec hold - Single Leg Stance with Support  - 1-2 x daily - 7 x weekly - 1 sets - 3 reps - 30 sec hold - Seated Anterior Tibialis Stretch  - 2 x daily - 7 x weekly - 1 sets - 3-5 reps - 10-20 sec hold  GOALS: Goals reviewed with patient? Yes  SHORT TERM GOALS: Target date: 11/30/24  The patient will be indep with initial HEP Baseline: initiated at eval 11/24/24: MET Goal status: INITIAL  2.  The patient will report pain with walking < or equal to 2/10.  Baseline:  6/10 11/24/24: 2/10 Goal status: MET  LONG TERM GOALS: Target date: 12/14/24  The patient will be indep with progression of HEP. Baseline:  initiated at eval Goal status: INITIAL  2.  The patient will return to gym routine for LE bike and machines. Baseline: Unable 11/17/24: return to LE exercises at gym 11/08/24 Goal status: MET  3.  The patient will improve gait speed to > or equal to 2.6 ft/sec to demo improved mobility. Baseline:  2.19 ft/sec 11/17/24: 2.5 ft/sec Goal status: IN PROGRESS  4.  The patient will report no pain with walking household and limited community distances. Baseline:  6/10 pain 11/22/24: 3/10 pain Goal status: IN PROGRESS  5. The patient will improve PSFS from score of 1, to a score of 3.  Baseline: 1 11/22/24: 9  Goal status: MET   ASSESSMENT:  CLINICAL IMPRESSION:  Focused on progressing ankle dorsiflexion functional strength and stabilization. Fatigue over time noted with balance activity on rocker board. Patient is making steady progress with POC; continued anterior ankle pain reported and encouraged patient to continue anterior tib stretch and strengthening ankle dorsiflexors.    EVAL: Patient is a 89 y.o. male who was seen today for physical  therapy evaluation and treatment for L closed avulsion fracture distal fibula. He was put in a boot to allow for healing and now notes having heel cord pain with weight bearing activities. He presents with impairments in ROM, gait speed, gait mechanics, strength, and general functional status. Pain is limiting loading of the L LE during gait leading to shortened steps and therefore, increased fall risk. Patient tolerated initial exercises today and was able to demonstrate improved stride length at end of session (gait speed measured after HEP initiated).    OBJECTIVE IMPAIRMENTS: Abnormal gait, decreased activity tolerance, decreased balance, decreased ROM, decreased strength, hypomobility, increased fascial restrictions, impaired sensation, and pain.    PLAN:  PT FREQUENCY:  2x/week  PT DURATION: 6 weeks  PLANNED INTERVENTIONS: 97164- PT Re-evaluation, 97750- Physical Performance Testing, 97110-Therapeutic exercises, 97530- Therapeutic activity, [id number]- Neuromuscular re-education, 97535- Self Care, [id number]- Manual therapy, [id number]- Gait training, [phone]- Orthotic Initial, [phone]- Aquatic Therapy, [phone] (1-2 muscles), [id number] (3+ muscles)- Dry Needling, Patient/Family education, Balance training, Taping, Joint mobilization, and Cryotherapy  PLAN FOR NEXT SESSION: Progress to light resistance with ankle mobility as tolerated. Progress gait mechanics, LE strengthening, return to gym routine as able.    Lamarr GORMAN Price, PTA 12/01/2024, 8:20 AM   "

## 2024-11-30 ENCOUNTER — Ambulatory Visit: Admitting: Internal Medicine

## 2024-11-30 ENCOUNTER — Encounter: Payer: Self-pay | Admitting: Internal Medicine

## 2024-11-30 VITALS — BP 136/76 | HR 58 | Temp 98.0°F | Resp 18 | Ht 71.0 in | Wt 218.1 lb

## 2024-11-30 DIAGNOSIS — N1832 Chronic kidney disease, stage 3b: Secondary | ICD-10-CM

## 2024-11-30 DIAGNOSIS — E119 Type 2 diabetes mellitus without complications: Secondary | ICD-10-CM

## 2024-11-30 DIAGNOSIS — K5900 Constipation, unspecified: Secondary | ICD-10-CM

## 2024-11-30 DIAGNOSIS — I10 Essential (primary) hypertension: Secondary | ICD-10-CM | POA: Diagnosis not present

## 2024-11-30 DIAGNOSIS — I25810 Atherosclerosis of coronary artery bypass graft(s) without angina pectoris: Secondary | ICD-10-CM | POA: Diagnosis not present

## 2024-11-30 DIAGNOSIS — E7800 Pure hypercholesterolemia, unspecified: Secondary | ICD-10-CM

## 2024-11-30 LAB — HEMOGLOBIN A1C: Hgb A1c MFr Bld: 7.1 % — ABNORMAL HIGH (ref 4.6–6.5)

## 2024-11-30 LAB — CBC WITH DIFFERENTIAL/PLATELET
Basophils Absolute: 0 K/uL (ref 0.0–0.1)
Basophils Relative: 0.5 % (ref 0.0–3.0)
Eosinophils Absolute: 0.3 K/uL (ref 0.0–0.7)
Eosinophils Relative: 4.3 % (ref 0.0–5.0)
HCT: 39.6 % (ref 39.0–52.0)
Hemoglobin: 13.4 g/dL (ref 13.0–17.0)
Lymphocytes Relative: 23.6 % (ref 12.0–46.0)
Lymphs Abs: 1.4 K/uL (ref 0.7–4.0)
MCHC: 33.8 g/dL (ref 30.0–36.0)
MCV: 95 fl (ref 78.0–100.0)
Monocytes Absolute: 0.6 K/uL (ref 0.1–1.0)
Monocytes Relative: 9.3 % (ref 3.0–12.0)
Neutro Abs: 3.8 K/uL (ref 1.4–7.7)
Neutrophils Relative %: 62.3 % (ref 43.0–77.0)
Platelets: 172 K/uL (ref 150.0–400.0)
RBC: 4.16 Mil/uL — ABNORMAL LOW (ref 4.22–5.81)
RDW: 14 % (ref 11.5–15.5)
WBC: 6 K/uL (ref 4.0–10.5)

## 2024-11-30 LAB — BASIC METABOLIC PANEL WITH GFR
BUN: 29 mg/dL — ABNORMAL HIGH (ref 6–23)
CO2: 27 meq/L (ref 19–32)
Calcium: 9.2 mg/dL (ref 8.4–10.5)
Chloride: 105 meq/L (ref 96–112)
Creatinine, Ser: 1.76 mg/dL — ABNORMAL HIGH (ref 0.40–1.50)
GFR: 33.49 mL/min — ABNORMAL LOW
Glucose, Bld: 129 mg/dL — ABNORMAL HIGH (ref 70–99)
Potassium: 4 meq/L (ref 3.5–5.1)
Sodium: 141 meq/L (ref 135–145)

## 2024-11-30 LAB — LIPID PANEL
Cholesterol: 113 mg/dL (ref 28–200)
HDL: 31.6 mg/dL — ABNORMAL LOW
LDL Cholesterol: 40 mg/dL (ref 10–99)
NonHDL: 81.01
Total CHOL/HDL Ratio: 4
Triglycerides: 206 mg/dL — ABNORMAL HIGH (ref 10.0–149.0)
VLDL: 41.2 mg/dL — ABNORMAL HIGH (ref 0.0–40.0)

## 2024-11-30 LAB — TSH: TSH: 5.56 u[IU]/mL — ABNORMAL HIGH (ref 0.35–5.50)

## 2024-11-30 MED ORDER — TAMSULOSIN HCL 0.4 MG PO CAPS: 0.4000 mg | ORAL_CAPSULE | Freq: Every day | ORAL | 1 refills | Status: AC

## 2024-11-30 MED ORDER — BENAZEPRIL HCL 20 MG PO TABS: 20.0000 mg | ORAL_TABLET | Freq: Every day | ORAL | 1 refills | Status: DC

## 2024-11-30 NOTE — Patient Instructions (Addendum)
 GO TO THE LAB :  Get the blood work    Then, go to the front desk for the checkout Please make an appointment for a checkup in 4 to 5 months   Recommend to consider COVID-vaccine at your pharmacy  Take Colace over-the-counter 1 tablet daily to alleviate the constipation  Start checking your blood pressure regularly Blood pressure goal:  between 110/65 and  135/85. If it is consistently higher or lower, let me know     LEFT FIBULA FRACTURE WITH CHRONIC LEFT ANKLE PAIN: You have chronic left ankle pain due to a previous fracture and significant arthritis. -Continue rehabilitation for balance and ankle issues.  CONSTIPATION WITH HISTORY OF HEMORRHOIDS AND MINOR RECTAL BLEEDING: You have chronic constipation with hemorrhoids and occasional minor rectal bleeding. No significant pain or itching. -Use Colace daily to soften stools and reduce straining. -Report if symptoms worsen or if there is significant bleeding.  LOWER URINARY TRACT SYMPTOMS: You have chronic urinary symptoms managed with tamsulosin . -Continue taking tamsulosin  for lower urinary tract symptoms.  TYPE 2 DIABETES MELLITUS: You have Type 2 diabetes mellitus and do not regularly check your blood sugars. -Continue taking Rybelsus  for diabetes management.  ESSENTIAL HYPERTENSION: Your blood pressure readings are satisfactory. -Continue taking benazepril , Lasix , and metoprolol  for blood pressure management.  STAGE 3B CHRONIC KIDNEY DISEASE: You have Stage 3b chronic kidney disease with your last GFR at 31. -Continue current management with benazepril . -Stay well-hydrated and avoid NSAIDs.  PURE HYPERCHOLESTEROLEMIA: You have high cholesterol managed with atorvastatin . -Continue taking atorvastatin  for hyperlipidemia management.    GENERAL HEALTH MAINTENANCE: You received a flu shot and we discussed the importance of COVID vaccination. -Get the COVID vaccination.

## 2024-11-30 NOTE — Progress Notes (Signed)
 "  Subjective:    Patient ID: [name], male    DOB: 1931/03/19, 89 y.o.   MRN: [id number]  DOS:  11/30/2024 Routine follow-up Discussed the use of AI scribe software for clinical note transcription with the patient, who gave verbal consent to proceed.  History of Present Illness [name] is a 89 year old male who presents for a routine checkup.  Musculoskeletal symptoms - Residual left ankle arthritis and discomfort following prior left fibula fracture - Currently undergoing rehabilitation for balance and ankle issues - Mild swelling of the left leg and foot  Gastrointestinal symptoms - Chronic constipation with minimal hemorrhoidal bleeding related to straining - Occasional use of over-the-counter laxative - No rectal pain or itching  Genitourinary symptoms - Urinary frequency treated with tamsulosin  - Discontinued Vesicare  due to lack of benefit - Reduced urination in the morning, increased frequency in the afternoon - Attributes some urinary frequency to furosemide  taken later in the day  Cardiovascular and metabolic management - Takes furosemide , benazepril , metoprolol , atorvastatin , and Eliquis  (ordered from [place]) - Monitors blood pressure and reports it as stable - Does not check blood sugar - No chest pain or dyspnea    Review of Systems See above   Past Medical History:  Diagnosis Date   Anemia    Antral gastritis 2013   EGD    Atrial fibrillation (HCC)    CAD (coronary artery disease)    s/p CABG 1993 (L-LAD, S-RI, S-D1);   echo 1/10: EF 55%;    myoview  12/09: inf MI, no ischemia   Candida esophagitis (HCC) 2013   EGD    Cataracts, bilateral    Family history of malignant neoplasm of gastrointestinal tract    Folliculitis    Hiatal hernia    HTN (hypertension)    Hyperlipidemia    Irritable bladder    Myocardial infarction [facility])    Obesity    Osteoarthritis     Past Surgical History:  Procedure Laterality Date   BLEPHAROPLASTY Bilateral  11/16/2021   upper lid   CATARACT EXTRACTION Right 07/21/2018   CHOLECYSTECTOMY     laparoscopic '92   CORONARY ARTERY BYPASS GRAFT     graft '92: LIMA-LAD, SVG - D2,R1, D1   ENTEROSCOPY  08/22/2012   Procedure: ENTEROSCOPY;  Surgeon: Renaye Sous, MD;  Location: [facility];  Service: Endoscopy;  Laterality: N/A;   INTRAOCULAR LENS EXCHANGE Right 07/21/2018   KNEE SURGERY     MOLE REMOVAL  2001 and 2008   PAROTIDECTOMY Right 06/18/2021   PILONIDAL CYST EXCISION      Current Outpatient Medications  Medication Instructions   acetaminophen  (TYLENOL ) 650 mg, Oral, 2 times daily   apixaban  (ELIQUIS ) 2.5 MG TABS tablet TAKE 1 TABLET(2.5 MG) BY MOUTH TWICE DAILY   atorvastatin  (LIPITOR) 40 MG tablet TAKE 1 TABLET(40 MG) BY MOUTH DAILY   benazepril  (LOTENSIN ) 20 mg, Oral, Daily   Coenzyme Q10 (COQ10) 100 MG CAPS 1 capsule, Daily   furosemide  (LASIX ) 40 mg, Oral, Daily   Melatonin 10 mg, Daily at bedtime   metoprolol  succinate (TOPROL -XL) 25 mg, Oral, Daily, Take with or immediately following a meal   Multiple Vitamin (MULTIVITAMIN WITH MINERALS) TABS tablet 1 tablet, Daily   Rybelsus  7 mg, Oral, Daily   tamsulosin  (FLOMAX ) 0.4 mg, Oral, Daily after supper       Objective:   Physical Exam BP 136/76   Pulse (!) 58   Temp 98 F (36.7 C) (Oral)   Resp 18  Ht 5' 11 (1.803 m)   Wt 218 lb 2 oz (98.9 kg)   BMI 30.42 kg/m  General:   Well developed, NAD, BMI noted. HEENT:  Normocephalic . Face symmetric, atraumatic Lungs:  CTA B Normal respiratory effort, no intercostal retractions, no accessory muscle use. Heart: Bradycardic Lower extremities: Trace pretibial edema bilaterally  Skin: Not pale. Not jaundice Neurologic:  alert & oriented X3.  Speech normal, gait appropriate, unassisted.  Needs help with transferring Psych--  Cognition and judgment appear intact.  Cooperative with normal attention span and concentration.  Behavior appropriate. No anxious or depressed  appearing.      Assessment   Problem list: DM:+ mild neuropathy.  avoiding Metformin -CKD. Avoiding Actos d/t h/o  CHF.  ; Farxiga  12-2022 (balanitis) HTN Hyperlipidemia CKD: Chronic, CT abdomen 2021: No  obstruction, bilateral cortical thinning. CV: --CAD, CABG 1993, Myoview  2009 no ischemia. --Atrial fibrillation -- rate control, xarelto  -- chronic combined systolic/diastolic congestive heart failure Venous insufficiency, mild LE edema L>>R  LUTS    MSK: DJD, spinal stenosis, R wrist Fx in the 80s GI: --Recurrent melena: Work-up 2013 Dr. Jakie: Colonoscopy, EGD, capsule endoscopy.  Felt to be due to NSAIDs --Candida esophagitis, antral gastritis --->  2013 per EGD --HH Sees derm x 2/year Metastatic SCC to R parotid gland, parotidectomy 06-2021, XRT      PLAN Left fibula fracture  Since last visit was diagnosed with a fracture, has seen orthopedics, had physical therapy.  Still has some pain and  balance issues, doing rehabilitation. Constipation with history of hemorrhoids and minor rectal bleeding No significant pain or itching. - Recommended daily use of Colace to soften stools and reduce straining. - Advised to report if symptoms worsen or if there is significant bleeding. LUTS: Chronic symptoms managed with tamsulosin .  History of abnormal DRE.  Vesicare  was prescribed recently, he took it and self discontinue.  Declined urology referral. Plan: Continue tamsulosin  for lower urinary tract symptoms. DM w/ CKD and CAD  Continue Rybelsus  for diabetes management.  No ambulatory CBGs.  Check A1c HTN Controlled.  Continue benazepril , Lasix , and metoprolol  for blood pressure management.  Labs. High cholesterol: On atorvastatin  and co-Q10, check FLP Stage 3b chronic kidney disease Delete last GFR at 31.  Does not see nephrology Continue benazepril , good hydration and avoidance of NSAIDs. Permanent A-fib: 08/30/2024 saw cardiology, no changes made. Pure  hypercholesterolemia Managed with atorvastatin .  Check labs- FLP, AST, ALT  General health maintenance Received flu shot. Discussed importance of COVID vaccination. Social: Still plays golf RTC 4 to 5 months    "

## 2024-11-30 NOTE — Assessment & Plan Note (Signed)
 Left fibula fracture  Since last visit was diagnosed with a fracture, has seen orthopedics, had physical therapy.  Still has some pain and  balance issues, doing rehabilitation. Constipation with history of hemorrhoids and minor rectal bleeding No significant pain or itching. - Recommended daily use of Colace to soften stools and reduce straining. - Advised to report if symptoms worsen or if there is significant bleeding. LUTS: Chronic symptoms managed with tamsulosin .  History of abnormal DRE.  Vesicare  was prescribed recently, he took it and self discontinue.  Declined urology referral. Plan: Continue tamsulosin  for lower urinary tract symptoms. DM w/ CKD and CAD  Continue Rybelsus  for diabetes management.  No ambulatory CBGs.  Check A1c HTN Controlled.  Continue benazepril , Lasix , and metoprolol  for blood pressure management.  Labs. High cholesterol: On atorvastatin  and co-Q10, check FLP Stage 3b chronic kidney disease Delete last GFR at 31.  Does not see nephrology Continue benazepril , good hydration and avoidance of NSAIDs. Permanent A-fib: 08/30/2024 saw cardiology, no changes made. Pure hypercholesterolemia Managed with atorvastatin .  Check labs- FLP, AST, ALT  General health maintenance Received flu shot. Discussed importance of COVID vaccination. Social: Still plays golf RTC 4 to 5 months

## 2024-12-01 ENCOUNTER — Ambulatory Visit

## 2024-12-01 DIAGNOSIS — G8929 Other chronic pain: Secondary | ICD-10-CM

## 2024-12-01 DIAGNOSIS — M25572 Pain in left ankle and joints of left foot: Secondary | ICD-10-CM

## 2024-12-01 DIAGNOSIS — M6281 Muscle weakness (generalized): Secondary | ICD-10-CM

## 2024-12-01 DIAGNOSIS — R2689 Other abnormalities of gait and mobility: Secondary | ICD-10-CM

## 2024-12-01 DIAGNOSIS — R29898 Other symptoms and signs involving the musculoskeletal system: Secondary | ICD-10-CM

## 2024-12-01 NOTE — Therapy (Cosign Needed Addendum)
 " OUTPATIENT PHYSICAL THERAPY TREATMENT   Progress Note Reporting Period 11/01/2024 to 12/01/2024  See note below for Objective Data and Assessment of Progress/Goals.     Patient Name: MEDFORD STAHELI MRN: [id number] DOB:1931/04/26, 89 y.o., male Today's Date: 12/01/2024  PCP: Aloysius Mech, MD REFERRING PROVIDER: Adine Birmingham, PA  END OF SESSION:  PT End of Session - 12/01/24 1308     Visit Number 10    Number of Visits 16    Date for Recertification  12/13/24    Authorization Type [employer] Medicare    Authorization Time Period 12 VISITS APPROVED FOR PT 11/01/2024-12/13/2024    Authorization - Visit Number 10    Authorization - Number of Visits 12    Progress Note Due on Visit 10    PT Start Time 1310    PT Stop Time 1353    PT Time Calculation (min) 43 min    Activity Tolerance Patient tolerated treatment well    Behavior During Therapy [facility] for tasks assessed/performed          PT End of Session - 12/01/24 1308     Visit Number 10    Number of Visits 16    Date for Recertification  12/13/24    Authorization Type [employer] Medicare    Authorization Time Period 12 VISITS APPROVED FOR PT 11/01/2024-12/13/2024    Authorization - Visit Number 10    Authorization - Number of Visits 12    Progress Note Due on Visit 10    PT Start Time 1310    PT Stop Time 1353    PT Time Calculation (min) 43 min    Activity Tolerance Patient tolerated treatment well    Behavior During Therapy [facility] for tasks assessed/performed         Past Medical History:  Diagnosis Date   Anemia    Antral gastritis 2013   EGD    Atrial fibrillation (HCC)    CAD (coronary artery disease)    s/p CABG 1993 (L-LAD, S-RI, S-D1);   echo 1/10: EF 55%;    myoview  12/09: inf MI, no ischemia   Candida esophagitis (HCC) 2013   EGD    Cataracts, bilateral    Family history of malignant neoplasm of gastrointestinal tract    Folliculitis    Hiatal hernia    HTN (hypertension)    Hyperlipidemia    Irritable bladder     Myocardial infarction [facility])    Obesity    Osteoarthritis    Past Surgical History:  Procedure Laterality Date   BLEPHAROPLASTY Bilateral 11/16/2021   upper lid   CATARACT EXTRACTION Right 07/21/2018   CHOLECYSTECTOMY     laparoscopic '92   CORONARY ARTERY BYPASS GRAFT     graft '92: LIMA-LAD, SVG - D2,R1, D1   ENTEROSCOPY  08/22/2012   Procedure: ENTEROSCOPY;  Surgeon: Renaye Sous, MD;  Location: [facility] ENDOSCOPY;  Service: Endoscopy;  Laterality: N/A;   INTRAOCULAR LENS EXCHANGE Right 07/21/2018   KNEE SURGERY     MOLE REMOVAL  2001 and 2008   PAROTIDECTOMY Right 06/18/2021   PILONIDAL CYST EXCISION     Patient Active Problem List   Diagnosis Date Noted   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) 07/26/2024   CKD (chronic kidney disease) stage 3, GFR 30-59 ml/min (HCC) 07/01/2022   Lymphadenopathy, inguinal, right 12/17/2021   Lumbar spondylosis 04/16/2019   CHF (congestive heart failure) (HCC) 12/17/2016   PCP NOTES >>>>> 10/11/2015   Anemia  08/14/2012   Annual physical exam 01/08/2012   Atrial fibrillation (HCC) 03/29/2011   Venous (peripheral) insufficiency 09/04/2009   OBESITY 07/04/2009   DJD (degenerative joint disease) 07/04/2009   SLEEP DISORDER 07/04/2009   DM II (diabetes mellitus, type II), controlled (HCC) 04/25/2008   Hyperlipidemia 06/25/2007   Essential hypertension 06/25/2007   CAD (coronary artery disease) of artery bypass graft 06/25/2007    ONSET DATE: 11/01/24  REFERRING DIAG:  T80.CHERENE (ICD-10-CM) - Fall, initial encounter  S82.832A (ICD-10-CM) - Closed avulsion fracture of distal end of left fibula, initial encounter   THERAPY DIAG:  Pain in left ankle and joints of left foot  Other abnormalities of gait and mobility  Muscle weakness (generalized)  Chronic pain of left knee  Other symptoms and signs involving the musculoskeletal system  Rationale for Evaluation and Treatment:  Rehabilitation  SUBJECTIVE:                                                                                                                                                                                             SUBJECTIVE STATEMENT:  Patient reports he had an episode of burning at from of ankle after walking out to the car but it was brief. Patient states 2/10 pain today in anterior ankle.   EVAL: The patient had onset of L ankle pain in early September after golfing. He was seen by urgent care initially and then went to orthopedic PA on 10/16 and then again on 10/21/24--he was found to have fibular fracture. The patient began weening himself from the boot after his visit 10/21/24 and is no longer wearing it. He arrives today with pain with walking. He attempted to stretch it at home and notes pain worsened.  Pt accompanied by: significant other  PERTINENT HISTORY: a-fib, cancer, GERD, HTN, CAD  PAIN:  Are you having pain? Yes: NPRS scale: 2/10 Pain location: posterior leg/ankle Pain description: with weight bearing Aggravating factors: walking Relieving factors: rest  PRECAUTIONS: Fall  WEIGHT BEARING RESTRICTIONS: Yes WBAT  FALLS: Has patient fallen in last 6 months? No  LIVING ENVIRONMENT: Lives with: significant other/roommate Lives in: House/apartment Stairs: No  PLOF: Independent  PATIENT GOALS: reduce pain, return to golf, improve walking  OBJECTIVE:  Note: Objective measures were completed at Evaluation unless otherwise noted.  DIAGNOSTIC FINDINGS: Review of imaging reveals x-rays 3 views of the left ankle from outside facility reveal old fibula distal shaft fracture that has callus formation minimal displacement otherwise patient has osteopenia and ankle arthritic changes with loss of joint space noted X-rays 2 views of the left tib-fib once again reveal old distal fibular shaft  fracture otherwise no other fractures noted patient does have what appears to be  staples from previous surgical procedure and the medial aspect of the lower leg.  COGNITION: Overall cognitive status: WFLs   SENSATION: WFL  EDEMA:  Figure 8: R is 59.5cm, L is 62 cm   LOWER EXTREMITY ROM:    Active  Right Eval Left Eval Left 11/15/24 Left  Hip flexion      Hip extension      Hip abduction      Hip adduction      Hip internal rotation      Hip external rotation      Knee flexion      Knee extension      Ankle dorsiflexion -5 -5 5 5   Ankle plantarflexion 45 32 52 55  Ankle inversion 20 20    Ankle eversion 20 10     (Blank rows = not tested)  LOWER EXTREMITY MMT:   MMT Right Eval Left Eval  Hip flexion 5/5 5/5  Hip extension    Hip abduction    Hip adduction    Hip internal rotation    Hip external rotation    Knee flexion 4/5 5/5  Knee extension 5/5 5/5  Ankle dorsiflexion 5/5 5/5  Ankle plantarflexion    Ankle inversion    Ankle eversion    (Blank rows = not tested)  BED MOBILITY:  Findings: WFLs  GAIT: Findings: Gait Characteristics: antalgic pattern, shortened step length R LE, Distance walked: 100 ft, and Comments: 20 ft/9.15 seconds=2.19 ft/sec  PATIENT SURVEYS:  PSFS: THE PATIENT SPECIFIC FUNCTIONAL SCALE  Place score of 0-10 (0 = unable to perform activity and 10 = able to perform activity at the same level as before injury or problem)  Activity Date: 11/01/24 11/22/24  Walking  1 8  2.Standing 1 10  Total Score 2/2=1 18/2 =9    Total Score = Sum of activity scores/number of activities  Minimally Detectable Change: 3 points (for single activity); 2 points (for average score)  [facility] (nd). The Patient Specific Functional Scale . Retrieved from [email or web address]    [facility] Adult PT Treatment:                                                DATE: 12/01/2024 Therapeutic Exercise: Lt gastroc stretch with toes on towel roll Ankle measurements (see  above) Manual Therapy: Ankle distraction Therapeutic Activity: Assisted ant tib stretch --> foot on ball Rocker board --> active stretch ankle PF/DF Gait speed (updated LTG) Single leg balance on airex + one hand support    [facility] Adult PT Treatment:                                                DATE: 11/29/2024 Neuromuscular re-ed: Ankle PF + black TB x15 Ankle DF + red TB x 10 --> green TB x 10 Rocker board: Fwd/bkwd rocking (ankle PF/DF) x 2 rounds Neutral board balance x 2 rounds  Therapeutic Activity: Seated anterior tib stretch with assist from ball for positioning Airex: Rhomberg stance  Added head turns & nods   [facility] Adult PT Treatment:  DATE: 11/24/2024 Therapeutic Exercise: NuStep L6 x 5 min Standing gastroc stretch Seated anterior tibialis stretch --> assisted with ball for positioning Neuromuscular re-ed: Resisted ankle:  Eversion + yellow TB 2 x 10 Inversion + yellow TB 2 x 10 PF + blue TB 2 x 10 DF + red TB 2 x 10 Single leg balance + UE support at wall --> added head turns/nods Therapeutic Activity: Towel scrunches  Standing heel raises 3/4 tandem stance balance + head turns/nods Golfer swing with green TB (bil)                                               PATIENT EDUCATION: Education details: Updated HEP Person educated: Patient Education method: Explanation, Demonstration, and Handouts Education comprehension: verbalized understanding, returned demonstration, and needs further education  HOME EXERCISE PROGRAM: Access Code: [email or web address] URL: [email or web address] Date: 11/24/2024 Prepared by: Lamarr Price  Exercises - Gastroc Stretch on Wall  - 2 x daily - 5 x weekly - 1 sets - 2 reps - 30 seconds hold - Standing Hip Abduction with Resistance at Ankles and Counter Support  - 1 x daily - 7 x weekly - 3 sets - 10 reps - Seated Ankle Eversion with Anchored Resistance  - 1 x daily - 7 x  weekly - 3 sets - 10 reps - Seated Ankle Inversion with Anchored Resistance  - 1 x daily - 7 x weekly - 3 sets - 10 reps - Seated Ankle Plantar Flexion with Resistance Loop  - 1 x daily - 7 x weekly - 3 sets - 10 reps - Seated Ankle Dorsiflexion with Anchored Resistance  - 1 x daily - 7 x weekly - 3 sets - 10 reps - Single Leg Stance with Support  - 1 x daily - 7 x weekly - 1 sets - 3-5 reps - 10-30 sec hold - Toe Yoga - Alternating Great Toe and Lesser Toe Extension  - 1 x daily - 7 x weekly - 3 sets - 10 reps - Standing Hip Extension with Resistance at Ankles and Counter Support  - 1 x daily - 7 x weekly - 3 sets - 10 reps - Standing Romberg to 3/4 Tandem Stance  - 2 x daily - 7 x weekly - 1 sets - 3 reps - 10-20 sec hold - Single Leg Stance with Support  - 1-2 x daily - 7 x weekly - 1 sets - 3 reps - 30 sec hold - Seated Anterior Tibialis Stretch  - 2 x daily - 7 x weekly - 1 sets - 3-5 reps - 10-20 sec hold  GOALS: Goals reviewed with patient? Yes  SHORT TERM GOALS: Target date: 11/30/24  The patient will be indep with initial HEP Baseline: initiated at eval Goal status: MET  2.  The patient will report pain with walking < or equal to 2/10.  Baseline:  6/10 11/24/24: 2/10 Goal status: MET  LONG TERM GOALS: Target date: 12/14/24  The patient will be indep with progression of HEP. Baseline:  initiated at eval Goal status: MET  2.  The patient will return to gym routine for LE bike and machines. Baseline: Unable 11/17/24: return to LE exercises at gym 11/08/24 Goal status: MET  3.  The patient will improve gait speed to > or equal to 2.6 ft/sec to demo improved mobility. Baseline:  2.19 ft/sec 11/17/24:  2.5 ft/sec 12/01/24: 3.3 ft/sec Goal status: MET  4.  The patient will report no pain with walking household and limited community distances. Baseline:  6/10 pain 12/01/24: 2/10 Goal status: IN PROGRESS  5. The patient will improve PSFS from score of 1, to a score of  3.  Baseline: 1 11/22/24: 9  Goal status: MET   ASSESSMENT:  CLINICAL IMPRESSION:  Patient is making good progress with POC, having met 4/5 LTGs. Improved heel strike demonstrated during gait, as well as improved dynamic balance with increased gait speed and directional changes. Significant improvement with weight bearing on Lt LE, especially in single leg stance. Fair ankle strategy demonstrated during single leg balance on compliant surface. Patient would benefit from balance activities to ensure safe participation with ADLs, such as return to gym and golfing.   EVAL: Patient is a 89 y.o. male who was seen today for physical therapy evaluation and treatment for L closed avulsion fracture distal fibula. He was put in a boot to allow for healing and now notes having heel cord pain with weight bearing activities. He presents with impairments in ROM, gait speed, gait mechanics, strength, and general functional status. Pain is limiting loading of the L LE during gait leading to shortened steps and therefore, increased fall risk. Patient tolerated initial exercises today and was able to demonstrate improved stride length at end of session (gait speed measured after HEP initiated).    OBJECTIVE IMPAIRMENTS: Abnormal gait, decreased activity tolerance, decreased balance, decreased ROM, decreased strength, hypomobility, increased fascial restrictions, impaired sensation, and pain.    PLAN:  PT FREQUENCY: 2x/week  PT DURATION: 6 weeks  PLANNED INTERVENTIONS: 97164- PT Re-evaluation, 97750- Physical Performance Testing, 97110-Therapeutic exercises, 97530- Therapeutic activity, [id number]- Neuromuscular re-education, 97535- Self Care, [id number]- Manual therapy, [id number]- Gait training, [phone]- Orthotic Initial, [phone]- Aquatic Therapy, [phone] (1-2 muscles), [id number] (3+ muscles)- Dry Needling, Patient/Family education, Balance training, Taping, Joint mobilization, and Cryotherapy  PLAN FOR NEXT SESSION: Balance;  discharge next visit?   Lamarr GORMAN Price, PTA 12/01/2024, 2:35 PM   "

## 2024-12-03 ENCOUNTER — Ambulatory Visit: Payer: Self-pay | Admitting: Internal Medicine

## 2024-12-06 ENCOUNTER — Ambulatory Visit

## 2024-12-06 DIAGNOSIS — M25572 Pain in left ankle and joints of left foot: Secondary | ICD-10-CM | POA: Diagnosis not present

## 2024-12-06 DIAGNOSIS — M6281 Muscle weakness (generalized): Secondary | ICD-10-CM

## 2024-12-06 DIAGNOSIS — G8929 Other chronic pain: Secondary | ICD-10-CM

## 2024-12-06 DIAGNOSIS — R2689 Other abnormalities of gait and mobility: Secondary | ICD-10-CM

## 2024-12-06 DIAGNOSIS — R29898 Other symptoms and signs involving the musculoskeletal system: Secondary | ICD-10-CM

## 2024-12-06 NOTE — Therapy (Signed)
 " OUTPATIENT PHYSICAL THERAPY TREATMENT     Patient Name: [name] MRN: [id number] DOB:Jun 03, 1931, 89 y.o., male Today's Date: 12/06/2024  PCP: Aloysius Mech, MD REFERRING PROVIDER: Adine Birmingham, PA  END OF SESSION:  PT End of Session - 12/06/24 1148     Visit Number 11    Number of Visits 16    Date for Recertification  12/13/24    Authorization Type [employer] Medicare    Authorization Time Period 12 VISITS APPROVED FOR PT 11/01/2024-12/13/2024    Authorization - Visit Number 11    Authorization - Number of Visits 12    PT Start Time 1148    PT Stop Time 1233    PT Time Calculation (min) 45 min    Activity Tolerance Patient tolerated treatment well    Behavior During Therapy [facility] for tasks assessed/performed          PT End of Session - 12/06/24 1148     Visit Number 11    Number of Visits 16    Date for Recertification  12/13/24    Authorization Type [employer] Medicare    Authorization Time Period 12 VISITS APPROVED FOR PT 11/01/2024-12/13/2024    Authorization - Visit Number 11    Authorization - Number of Visits 12    PT Start Time 1148    PT Stop Time 1233    PT Time Calculation (min) 45 min    Activity Tolerance Patient tolerated treatment well    Behavior During Therapy WFL for tasks assessed/performed         Past Medical History:  Diagnosis Date   Anemia    Antral gastritis 2013   EGD    Atrial fibrillation (HCC)    CAD (coronary artery disease)    s/p CABG 1993 (L-LAD, S-RI, S-D1);   echo 1/10: EF 55%;    myoview  12/09: inf MI, no ischemia   Candida esophagitis (HCC) 2013   EGD    Cataracts, bilateral    Family history of malignant neoplasm of gastrointestinal tract    Folliculitis    Hiatal hernia    HTN (hypertension)    Hyperlipidemia    Irritable bladder    Myocardial infarction [facility])    Obesity    Osteoarthritis    Past Surgical History:  Procedure Laterality Date   BLEPHAROPLASTY Bilateral 11/16/2021   upper lid   CATARACT EXTRACTION Right  07/21/2018   CHOLECYSTECTOMY     laparoscopic '92   CORONARY ARTERY BYPASS GRAFT     graft '92: LIMA-LAD, SVG - D2,R1, D1   ENTEROSCOPY  08/22/2012   Procedure: ENTEROSCOPY;  Surgeon: Renaye Sous, MD;  Location: [facility];  Service: Endoscopy;  Laterality: N/A;   INTRAOCULAR LENS EXCHANGE Right 07/21/2018   KNEE SURGERY     MOLE REMOVAL  2001 and 2008   PAROTIDECTOMY Right 06/18/2021   PILONIDAL CYST EXCISION     Patient Active Problem List   Diagnosis Date Noted   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) 07/26/2024   CKD (chronic kidney disease) stage 3, GFR 30-59 ml/min (HCC) 07/01/2022   Lymphadenopathy, inguinal, right 12/17/2021   Lumbar spondylosis 04/16/2019   CHF (congestive heart failure) (HCC) 12/17/2016   PCP NOTES >>>>> 10/11/2015   Anemia 08/14/2012   Annual physical exam 01/08/2012   Atrial fibrillation (HCC) 03/29/2011   Venous (peripheral) insufficiency 09/04/2009   OBESITY 07/04/2009   DJD (degenerative joint disease) 07/04/2009   SLEEP DISORDER 07/04/2009   DM  II (diabetes mellitus, type II), controlled (HCC) 04/25/2008   Hyperlipidemia 06/25/2007   Essential hypertension 06/25/2007   CAD (coronary artery disease) of artery bypass graft 06/25/2007    ONSET DATE: 11/01/24  REFERRING DIAG:  T80.CHERENE (ICD-10-CM) - Fall, initial encounter  S82.832A (ICD-10-CM) - Closed avulsion fracture of distal end of left fibula, initial encounter   THERAPY DIAG:  Pain in left ankle and joints of left foot  Other abnormalities of gait and mobility  Muscle weakness (generalized)  Chronic pain of left knee  Other symptoms and signs involving the musculoskeletal system  Rationale for Evaluation and Treatment: Rehabilitation  SUBJECTIVE:                                                                                                                                                                                              SUBJECTIVE STATEMENT:  Patient reports he has had less burning at front of ankle, states the ankle stretch is helping; states shifting his weight when standing for long periods of time helped with less pain when walking.   EVAL: The patient had onset of L ankle pain in early September after golfing. He was seen by urgent care initially and then went to orthopedic PA on 10/16 and then again on 10/21/24--he was found to have fibular fracture. The patient began weening himself from the boot after his visit 10/21/24 and is no longer wearing it. He arrives today with pain with walking. He attempted to stretch it at home and notes pain worsened.  Pt accompanied by: significant other  PERTINENT HISTORY: a-fib, cancer, GERD, HTN, CAD  PAIN:  Are you having pain? Yes: NPRS scale: 2/10 Pain location: posterior leg/ankle Pain description: with weight bearing Aggravating factors: walking Relieving factors: rest  PRECAUTIONS: Fall  WEIGHT BEARING RESTRICTIONS: Yes WBAT  FALLS: Has patient fallen in last 6 months? No  LIVING ENVIRONMENT: Lives with: significant other/roommate Lives in: House/apartment Stairs: No  PLOF: Independent  PATIENT GOALS: reduce pain, return to golf, improve walking  OBJECTIVE:  Note: Objective measures were completed at Evaluation unless otherwise noted.  DIAGNOSTIC FINDINGS: Review of imaging reveals x-rays 3 views of the left ankle from outside facility reveal old fibula distal shaft fracture that has callus formation minimal displacement otherwise patient has osteopenia and ankle arthritic changes with loss of joint space noted X-rays 2 views of the left tib-fib once again reveal old distal fibular shaft fracture otherwise no other fractures noted patient does have what appears to be staples from previous surgical procedure and the medial aspect of the lower leg.  COGNITION: Overall cognitive status: WFLs  SENSATION: WFL  EDEMA:  Figure 8: R is  59.5cm, L is 62 cm   LOWER EXTREMITY ROM:    Active  Right Eval Left Eval Left 11/15/24 Left  Hip flexion      Hip extension      Hip abduction      Hip adduction      Hip internal rotation      Hip external rotation      Knee flexion      Knee extension      Ankle dorsiflexion -5 -5 5 5   Ankle plantarflexion 45 32 52 55  Ankle inversion 20 20    Ankle eversion 20 10     (Blank rows = not tested)  LOWER EXTREMITY MMT:   MMT Right Eval Left Eval  Hip flexion 5/5 5/5  Hip extension    Hip abduction    Hip adduction    Hip internal rotation    Hip external rotation    Knee flexion 4/5 5/5  Knee extension 5/5 5/5  Ankle dorsiflexion 5/5 5/5  Ankle plantarflexion    Ankle inversion    Ankle eversion    (Blank rows = not tested)  BED MOBILITY:  Findings: WFLs  GAIT: Findings: Gait Characteristics: antalgic pattern, shortened step length R LE, Distance walked: 100 ft, and Comments: 20 ft/9.15 seconds=2.19 ft/sec  PATIENT SURVEYS:  PSFS: THE PATIENT SPECIFIC FUNCTIONAL SCALE  Place score of 0-10 (0 = unable to perform activity and 10 = able to perform activity at the same level as before injury or problem)  Activity Date: 11/01/24 11/22/24  Walking  1 8  2.Standing 1 10  Total Score 2/2=1 18/2 =9    Total Score = Sum of activity scores/number of activities  Minimally Detectable Change: 3 points (for single activity); 2 points (for average score)  [facility] (nd). The Patient Specific Functional Scale . Retrieved from [email or web address]    [facility] Adult PT Treatment:                                                DATE: 12/06/2024 Therapeutic Exercise: Alternating heel/toe raises Gastroc stretch Ant tib stretch with ball Therapeutic Activity: Walking + head turn/nods Walking with self ball toss Rocker board: Lateral rocking  Neutral board balance Fwd/bakwd rocking Neutral board  balance Foam beam: (UE support prn + SBA) Side stepping Tandem walking Tandem balance Neuromuscular Re-ed: Barefoot heel raises Barefoot toe raises with isometric hold Stride stance fwd weight shifting onto Lt Resisted ankle dorsiflexion + green TB --> red TB    [facility] Adult PT Treatment:                                                DATE: 12/01/2024 Therapeutic Exercise: Lt gastroc stretch with toes on towel roll Ankle measurements (see above) Manual Therapy: Ankle distraction Therapeutic Activity: Assisted ant tib stretch --> foot on ball Rocker board --> active stretch ankle PF/DF Gait speed (updated LTG) Single leg balance on airex + one hand support    [facility] Adult PT Treatment:  DATE: 11/29/2024 Neuromuscular re-ed: Ankle PF + black TB x15 Ankle DF + red TB x 10 --> green TB x 10 Rocker board: Fwd/bkwd rocking (ankle PF/DF) x 2 rounds Neutral board balance x 2 rounds  Therapeutic Activity: Seated anterior tib stretch with assist from ball for positioning Airex: Rhomberg stance  Added head turns & nods                                                PATIENT EDUCATION: Education details: Updated HEP Person educated: Patient Education method: Explanation, Demonstration, and Handouts Education comprehension: verbalized understanding, returned demonstration, and needs further education  HOME EXERCISE PROGRAM: Access Code: [email or web address] URL: [email or web address] Date: 12/06/2024 Prepared by: Lamarr Price  Exercises - Gastroc Stretch on Wall  - 2 x daily - 5 x weekly - 1 sets - 2 reps - 30 seconds hold - Standing Hip Abduction with Resistance at Ankles and Counter Support  - 1 x daily - 7 x weekly - 3 sets - 10 reps - Single Leg Stance with Support  - 1 x daily - 7 x weekly - 1 sets - 3-5 reps - 10-30 sec hold - Toe Yoga - Alternating Great Toe and Lesser Toe Extension  - 1 x daily - 7 x weekly - 3 sets -  10 reps - Standing Hip Extension with Resistance at Ankles and Counter Support  - 1 x daily - 7 x weekly - 3 sets - 10 reps - Standing Romberg to 3/4 Tandem Stance  - 2 x daily - 7 x weekly - 1 sets - 3 reps - 10-20 sec hold - Single Leg Stance with Support  - 1-2 x daily - 7 x weekly - 1 sets - 3 reps - 30 sec hold - Seated Anterior Tibialis Stretch  - 2 x daily - 7 x weekly - 1 sets - 3-5 reps - 10-20 sec hold - Ankle Inversion with Resistance  - 1 x daily - 7 x weekly - 3 sets - 10 reps - Ankle Eversion with Resistance  - 1 x daily - 7 x weekly - 3 sets - 10 reps - Ankle Dorsiflexion with Resistance  - 1 x daily - 7 x weekly - 3 sets - 10 reps - Stride Stance Weight Shift  - 1 x daily - 7 x weekly - 3 sets - 10 reps - Seated Trunk Rotation  - 2 x daily - 7 x weekly - 1 sets - 10 reps - 5-10 sec hold - TL Sidebending Stretch - Single Arm Overhead  - 2 x daily - 7 x weekly - 1 sets - 10 reps - 10 sec hold  GOALS: Goals reviewed with patient? Yes  SHORT TERM GOALS: Target date: 11/30/24  The patient will be indep with initial HEP Baseline: initiated at eval Goal status: MET  2.  The patient will report pain with walking < or equal to 2/10.  Baseline:  6/10 11/24/24: 2/10 Goal status: MET  LONG TERM GOALS: Target date: 12/14/24  The patient will be indep with progression of HEP. Baseline:  initiated at eval Goal status: MET  2.  The patient will return to gym routine for LE bike and machines. Baseline: Unable 11/17/24: return to LE exercises at gym 11/08/24 Goal status: MET  3.  The patient will improve gait speed to > or  equal to 2.6 ft/sec to demo improved mobility. Baseline:  2.19 ft/sec 11/17/24: 2.5 ft/sec 12/01/24: 3.3 ft/sec Goal status: MET  4.  The patient will report no pain with walking household and limited community distances. Baseline:  6/10 pain 12/01/24: 2/10 Goal status: IN PROGRESS  5. The patient will improve PSFS from score of 1, to a score of  3.  Baseline: 1 11/22/24: 9  Goal status: MET   ASSESSMENT:  CLINICAL IMPRESSION:  No change in 2/10 anterior ankle pain when walking. Continued balance activities with focus on challenging ankle strategy on unstable surfaces; SBA provided as needed. Decreasing resistance improved ankle dorsiflexion mechanics and decreased great toe extension compensation. Patient has met all goals in POC (no change in pain level of 2/10) and is agreement to discharge.  EVAL: Patient is a 89 y.o. male who was seen today for physical therapy evaluation and treatment for L closed avulsion fracture distal fibula. He was put in a boot to allow for healing and now notes having heel cord pain with weight bearing activities. He presents with impairments in ROM, gait speed, gait mechanics, strength, and general functional status. Pain is limiting loading of the L LE during gait leading to shortened steps and therefore, increased fall risk. Patient tolerated initial exercises today and was able to demonstrate improved stride length at end of session (gait speed measured after HEP initiated).    OBJECTIVE IMPAIRMENTS: Abnormal gait, decreased activity tolerance, decreased balance, decreased ROM, decreased strength, hypomobility, increased fascial restrictions, impaired sensation, and pain.    PLAN:  PT FREQUENCY: 2x/week  PT DURATION: 6 weeks  PLANNED INTERVENTIONS: 97164- PT Re-evaluation, 97750- Physical Performance Testing, 97110-Therapeutic exercises, 97530- Therapeutic activity, 97112- Neuromuscular re-education, 97535- Self Care, [id number]- Manual therapy, [phone]- Gait training, [phone]- Orthotic Initial, [phone]- Aquatic Therapy, [phone] (1-2 muscles), [id number] (3+ muscles)- Dry Needling, Patient/Family education, Balance training, Taping, Joint mobilization, and Cryotherapy  PLAN FOR NEXT SESSION: Discharge   Lamarr GORMAN Price, PTA 12/06/2024, 12:42 PM   "

## 2024-12-08 ENCOUNTER — Ambulatory Visit

## 2024-12-14 ENCOUNTER — Other Ambulatory Visit: Payer: Self-pay | Admitting: Pharmacist

## 2024-12-14 ENCOUNTER — Telehealth: Payer: Self-pay | Admitting: Pharmacist

## 2024-12-14 NOTE — Telephone Encounter (Signed)
 Attempt was made to contact patient by phone today for follow up by Clinical Pharmacist regarding medication access / cost.  Unable to reach patient. LM on VM with my contact number [phone].

## 2025-01-12 ENCOUNTER — Other Ambulatory Visit: Payer: Self-pay | Admitting: Internal Medicine

## 2025-01-12 DIAGNOSIS — I10 Essential (primary) hypertension: Secondary | ICD-10-CM

## 2025-04-01 ENCOUNTER — Ambulatory Visit: Admitting: Internal Medicine

## 2025-06-22 ENCOUNTER — Ambulatory Visit
